# Patient Record
Sex: Female | Born: 1975
Health system: Southern US, Community
[De-identification: ages and names within clinical notes are randomized; demographics above are authoritative.]

## PROBLEM LIST (undated history)

## (undated) DIAGNOSIS — J45909 Unspecified asthma, uncomplicated: Secondary | ICD-10-CM

## (undated) DIAGNOSIS — I639 Cerebral infarction, unspecified: Secondary | ICD-10-CM

## (undated) DIAGNOSIS — Z9851 Tubal ligation status: Secondary | ICD-10-CM

## (undated) DIAGNOSIS — K219 Gastro-esophageal reflux disease without esophagitis: Secondary | ICD-10-CM

## (undated) DIAGNOSIS — I2510 Atherosclerotic heart disease of native coronary artery without angina pectoris: Secondary | ICD-10-CM

## (undated) DIAGNOSIS — E785 Hyperlipidemia, unspecified: Secondary | ICD-10-CM

## (undated) DIAGNOSIS — E1140 Type 2 diabetes mellitus with diabetic neuropathy, unspecified: Secondary | ICD-10-CM

## (undated) DIAGNOSIS — K8020 Calculus of gallbladder without cholecystitis without obstruction: Secondary | ICD-10-CM

## (undated) DIAGNOSIS — F419 Anxiety disorder, unspecified: Secondary | ICD-10-CM

## (undated) DIAGNOSIS — R0600 Dyspnea, unspecified: Secondary | ICD-10-CM

## (undated) DIAGNOSIS — E119 Type 2 diabetes mellitus without complications: Secondary | ICD-10-CM

## (undated) DIAGNOSIS — E669 Obesity, unspecified: Secondary | ICD-10-CM

## (undated) HISTORY — DX: Atherosclerotic heart disease of native coronary artery without angina pectoris: I25.10

## (undated) HISTORY — PX: TUBAL LIGATION: SHX77

## (undated) HISTORY — DX: Type 2 diabetes mellitus without complications: E11.9

## (undated) HISTORY — DX: Cerebral infarction, unspecified: I63.9

---

## 2001-06-18 ENCOUNTER — Encounter: Payer: Self-pay | Admitting: Emergency Medicine

## 2001-06-18 ENCOUNTER — Emergency Department (HOSPITAL_COMMUNITY): Admission: EM | Admit: 2001-06-18 | Discharge: 2001-06-18 | Payer: Self-pay | Admitting: Emergency Medicine

## 2002-05-10 ENCOUNTER — Encounter: Payer: Self-pay | Admitting: *Deleted

## 2002-05-10 ENCOUNTER — Ambulatory Visit (HOSPITAL_COMMUNITY): Admission: RE | Admit: 2002-05-10 | Discharge: 2002-05-10 | Payer: Self-pay | Admitting: *Deleted

## 2003-05-21 ENCOUNTER — Ambulatory Visit (HOSPITAL_COMMUNITY): Admission: RE | Admit: 2003-05-21 | Discharge: 2003-05-21 | Payer: Self-pay | Admitting: Chiropractic Medicine

## 2008-08-22 ENCOUNTER — Emergency Department (HOSPITAL_COMMUNITY): Admission: EM | Admit: 2008-08-22 | Discharge: 2008-08-22 | Payer: Self-pay | Admitting: Emergency Medicine

## 2009-08-04 DIAGNOSIS — E785 Hyperlipidemia, unspecified: Secondary | ICD-10-CM

## 2009-08-04 DIAGNOSIS — K8020 Calculus of gallbladder without cholecystitis without obstruction: Secondary | ICD-10-CM

## 2009-08-04 HISTORY — DX: Calculus of gallbladder without cholecystitis without obstruction: K80.20

## 2009-08-04 HISTORY — DX: Hyperlipidemia, unspecified: E78.5

## 2009-08-13 ENCOUNTER — Ambulatory Visit: Payer: Self-pay | Admitting: Internal Medicine

## 2009-08-17 ENCOUNTER — Ambulatory Visit: Payer: Self-pay | Admitting: Internal Medicine

## 2009-08-24 ENCOUNTER — Ambulatory Visit: Payer: Self-pay | Admitting: Internal Medicine

## 2009-08-24 ENCOUNTER — Encounter (INDEPENDENT_AMBULATORY_CARE_PROVIDER_SITE_OTHER): Payer: Self-pay | Admitting: Family Medicine

## 2009-08-24 LAB — CONVERTED CEMR LAB
ALT: 54 units/L — ABNORMAL HIGH (ref 0–35)
Albumin: 4.1 g/dL (ref 3.5–5.2)
CO2: 24 meq/L (ref 19–32)
Calcium: 9.2 mg/dL (ref 8.4–10.5)
Chloride: 103 meq/L (ref 96–112)
Cholesterol: 202 mg/dL — ABNORMAL HIGH (ref 0–200)
Eosinophils Absolute: 0.1 10*3/uL (ref 0.0–0.7)
Lymphs Abs: 3.5 10*3/uL (ref 0.7–4.0)
MCV: 78 fL (ref 78.0–100.0)
Monocytes Relative: 7 % (ref 3–12)
Neutro Abs: 2.6 10*3/uL (ref 1.7–7.7)
Neutrophils Relative %: 40 % — ABNORMAL LOW (ref 43–77)
RBC: 4.99 M/uL (ref 3.87–5.11)
Sodium: 138 meq/L (ref 135–145)
Total Protein: 7.8 g/dL (ref 6.0–8.3)
WBC: 6.7 10*3/uL (ref 4.0–10.5)

## 2009-08-26 ENCOUNTER — Encounter (INDEPENDENT_AMBULATORY_CARE_PROVIDER_SITE_OTHER): Payer: Self-pay | Admitting: Family Medicine

## 2009-08-26 LAB — CONVERTED CEMR LAB
Hep A Total Ab: POSITIVE — AB
Hep B S Ab: NEGATIVE

## 2009-09-03 ENCOUNTER — Ambulatory Visit: Payer: Self-pay | Admitting: Internal Medicine

## 2009-09-03 ENCOUNTER — Encounter (INDEPENDENT_AMBULATORY_CARE_PROVIDER_SITE_OTHER): Payer: Self-pay | Admitting: Family Medicine

## 2009-09-03 LAB — CONVERTED CEMR LAB
ALT: 38 units/L — ABNORMAL HIGH (ref 0–35)
Total Protein: 7.5 g/dL (ref 6.0–8.3)

## 2009-09-17 ENCOUNTER — Ambulatory Visit: Payer: Self-pay | Admitting: Internal Medicine

## 2010-04-15 ENCOUNTER — Emergency Department (HOSPITAL_COMMUNITY)
Admission: EM | Admit: 2010-04-15 | Discharge: 2010-04-15 | Payer: Self-pay | Source: Home / Self Care | Admitting: Family Medicine
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
## 2010-05-14 ENCOUNTER — Encounter (INDEPENDENT_AMBULATORY_CARE_PROVIDER_SITE_OTHER): Payer: Self-pay | Admitting: *Deleted

## 2010-05-14 LAB — CONVERTED CEMR LAB
AST: 47 units/L — ABNORMAL HIGH (ref 0–37)
Albumin: 4 g/dL (ref 3.5–5.2)
Alkaline Phosphatase: 53 units/L (ref 39–117)
BUN: 8 mg/dL (ref 6–23)
HDL: 38 mg/dL — ABNORMAL LOW (ref >39)
LDL Cholesterol: 130 mg/dL — ABNORMAL HIGH (ref 0–99)
Potassium: 4.3 meq/L (ref 3.5–5.3)
Sodium: 136 meq/L (ref 135–145)
Total Bilirubin: 0.4 mg/dL (ref 0.3–1.2)
VLDL: 36 mg/dL (ref 0–40)

## 2010-06-01 ENCOUNTER — Ambulatory Visit (HOSPITAL_COMMUNITY): Admission: RE | Admit: 2010-06-01 | Discharge: 2010-06-01 | Payer: Self-pay | Admitting: Family Medicine

## 2010-09-16 LAB — POCT I-STAT, CHEM 8
Calcium, Ion: 1.23 mmol/L (ref 1.12–1.32)
Chloride: 100 mEq/L (ref 96–112)
Glucose, Bld: 438 mg/dL — ABNORMAL HIGH (ref 70–99)
HCT: 41 % (ref 36.0–46.0)
Hemoglobin: 13.9 g/dL (ref 12.0–15.0)
TCO2: 26 mmol/L (ref 0–100)

## 2010-09-16 LAB — POCT URINALYSIS DIPSTICK
Glucose, UA: >=1000 mg/dL — AB
Nitrite: NEGATIVE
Specific Gravity, Urine: <=1.005 (ref 1.005–1.030)
Urobilinogen, UA: 0.2 mg/dL (ref 0.0–1.0)

## 2010-09-16 LAB — URINE CULTURE

## 2010-09-16 LAB — GLUCOSE, CAPILLARY: Glucose-Capillary: 469 mg/dL — ABNORMAL HIGH (ref 70–99)

## 2010-09-16 LAB — POCT PREGNANCY, URINE: Preg Test, Ur: NEGATIVE

## 2010-09-20 ENCOUNTER — Other Ambulatory Visit (HOSPITAL_COMMUNITY)
Admission: RE | Admit: 2010-09-20 | Discharge: 2010-09-20 | Disposition: A | Discharge status: Home / Self Care | Payer: Self-pay | Source: Ambulatory Visit | Attending: Family Medicine | Admitting: Family Medicine

## 2010-09-20 DIAGNOSIS — Z01419 Encounter for gynecological examination (general) (routine) without abnormal findings: Secondary | ICD-10-CM | POA: Insufficient documentation

## 2010-09-21 ENCOUNTER — Encounter: Payer: Self-pay | Admitting: Internal Medicine

## 2010-09-21 ENCOUNTER — Other Ambulatory Visit: Payer: Self-pay | Admitting: Family Medicine

## 2010-09-21 LAB — CONVERTED CEMR LAB
Chlamydia, DNA Probe: NEGATIVE
GC Probe Amp, Genital: NEGATIVE
HCV Ab: NEGATIVE
Hep A Total Ab: POSITIVE — AB
Hep B Core Total Ab: NEGATIVE
Hep B S Ab: NEGATIVE

## 2010-10-19 LAB — POCT I-STAT, CHEM 8
Chloride: 99 mEq/L (ref 96–112)
Creatinine, Ser: 0.8 mg/dL (ref 0.4–1.2)
Glucose, Bld: 510 mg/dL (ref 70–99)
Hemoglobin: 15.3 g/dL — ABNORMAL HIGH (ref 12.0–15.0)
Potassium: 4.3 mEq/L (ref 3.5–5.1)
Sodium: 134 mEq/L — ABNORMAL LOW (ref 135–145)

## 2011-01-14 ENCOUNTER — Emergency Department (HOSPITAL_COMMUNITY)
Admission: EM | Admit: 2011-01-14 | Discharge: 2011-01-15 | Disposition: A | Discharge status: Home / Self Care | Payer: No Typology Code available for payment source | Attending: Emergency Medicine | Admitting: Emergency Medicine

## 2011-01-14 DIAGNOSIS — M25476 Effusion, unspecified foot: Secondary | ICD-10-CM | POA: Insufficient documentation

## 2011-01-14 DIAGNOSIS — M542 Cervicalgia: Secondary | ICD-10-CM | POA: Insufficient documentation

## 2011-01-14 DIAGNOSIS — M545 Low back pain, unspecified: Secondary | ICD-10-CM | POA: Insufficient documentation

## 2011-01-14 DIAGNOSIS — S20219A Contusion of unspecified front wall of thorax, initial encounter: Secondary | ICD-10-CM | POA: Insufficient documentation

## 2011-01-14 DIAGNOSIS — S62319A Displaced fracture of base of unspecified metacarpal bone, initial encounter for closed fracture: Secondary | ICD-10-CM | POA: Insufficient documentation

## 2011-01-14 DIAGNOSIS — M25539 Pain in unspecified wrist: Secondary | ICD-10-CM | POA: Insufficient documentation

## 2011-01-14 DIAGNOSIS — E119 Type 2 diabetes mellitus without complications: Secondary | ICD-10-CM | POA: Insufficient documentation

## 2011-01-14 DIAGNOSIS — S7010XA Contusion of unspecified thigh, initial encounter: Secondary | ICD-10-CM | POA: Insufficient documentation

## 2011-01-14 DIAGNOSIS — R071 Chest pain on breathing: Secondary | ICD-10-CM | POA: Insufficient documentation

## 2011-01-14 DIAGNOSIS — Z794 Long term (current) use of insulin: Secondary | ICD-10-CM | POA: Insufficient documentation

## 2011-01-14 DIAGNOSIS — M7989 Other specified soft tissue disorders: Secondary | ICD-10-CM | POA: Insufficient documentation

## 2011-01-14 DIAGNOSIS — M25579 Pain in unspecified ankle and joints of unspecified foot: Secondary | ICD-10-CM | POA: Insufficient documentation

## 2011-01-14 DIAGNOSIS — M79609 Pain in unspecified limb: Secondary | ICD-10-CM | POA: Insufficient documentation

## 2011-01-14 DIAGNOSIS — M25473 Effusion, unspecified ankle: Secondary | ICD-10-CM | POA: Insufficient documentation

## 2011-01-14 DIAGNOSIS — S60229A Contusion of unspecified hand, initial encounter: Secondary | ICD-10-CM | POA: Insufficient documentation

## 2011-01-15 ENCOUNTER — Emergency Department (HOSPITAL_COMMUNITY): Payer: No Typology Code available for payment source

## 2011-01-15 LAB — GLUCOSE, CAPILLARY: Glucose-Capillary: 109 mg/dL — ABNORMAL HIGH (ref 70–99)
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
## 2013-02-11 ENCOUNTER — Ambulatory Visit: Payer: Self-pay | Attending: Family Medicine

## 2013-02-13 ENCOUNTER — Encounter: Payer: Self-pay | Admitting: Internal Medicine

## 2013-02-13 ENCOUNTER — Other Ambulatory Visit (HOSPITAL_COMMUNITY)
Admission: RE | Admit: 2013-02-13 | Discharge: 2013-02-13 | Disposition: A | Discharge status: Home / Self Care | Payer: No Typology Code available for payment source | Source: Ambulatory Visit | Attending: Family Medicine | Admitting: Family Medicine

## 2013-02-13 ENCOUNTER — Ambulatory Visit: Payer: No Typology Code available for payment source | Attending: Family Medicine | Admitting: Internal Medicine

## 2013-02-13 VITALS — BP 120/81 | HR 83 | Temp 98.7°F | Resp 16 | Ht 65.0 in | Wt 262.4 lb

## 2013-02-13 DIAGNOSIS — Z0000 Encounter for general adult medical examination without abnormal findings: Secondary | ICD-10-CM

## 2013-02-13 DIAGNOSIS — Z01419 Encounter for gynecological examination (general) (routine) without abnormal findings: Secondary | ICD-10-CM | POA: Insufficient documentation

## 2013-02-13 DIAGNOSIS — E119 Type 2 diabetes mellitus without complications: Secondary | ICD-10-CM | POA: Insufficient documentation

## 2013-02-13 DIAGNOSIS — Z1151 Encounter for screening for human papillomavirus (HPV): Secondary | ICD-10-CM | POA: Insufficient documentation

## 2013-02-13 DIAGNOSIS — Z9189 Other specified personal risk factors, not elsewhere classified: Secondary | ICD-10-CM

## 2013-02-13 DIAGNOSIS — Z2089 Contact with and (suspected) exposure to other communicable diseases: Secondary | ICD-10-CM

## 2013-02-13 DIAGNOSIS — Z202 Contact with and (suspected) exposure to infections with a predominantly sexual mode of transmission: Secondary | ICD-10-CM

## 2013-02-13 LAB — POCT GLYCOSYLATED HEMOGLOBIN (HGB A1C): Hemoglobin A1C: 9.3

## 2013-02-13 LAB — GLUCOSE, POCT (MANUAL RESULT ENTRY): POC Glucose: 177 mg/dl — AB (ref 70–99)

## 2013-02-13 LAB — POCT UA - GLUCOSE/PROTEIN: Protein, UA: 30

## 2013-02-13 MED ORDER — NICOTINE 14 MG/24HR TD PT24: 1.0000 | MEDICATED_PATCH | TRANSDERMAL | Status: DC

## 2013-02-13 NOTE — Progress Notes (Signed)
Patient ID: Julie Robertson, female   DOB: 10-31-1975, 37 y.o.   MRN: [id number]  CC: To establish care and for STD check  HPI: Patient is a 37 years old woman who came in today to establish care. She also wants a general physical exam done, including breast exam and Pap smear. She has no specific complaint. She wants to be checked for sexually transmitted disease regurgitation may have been exposed. Denies any vaginal discharge or itching. No abdominal pain.  She has history of diabetes on metformin.  She smoke cigarette about 1.5 packs per day, would like to quit No chest pain, no tingling sensation, no visual impairment.  No Known Allergies Past Medical History  Diagnosis Date  . Diabetes mellitus without complication    No current outpatient prescriptions on file prior to visit.   No current facility-administered medications on file prior to visit.   Family History  Problem Relation Age of Onset  . Diabetes Mother   . Heart disease Mother   . Cancer Mother   . Cancer Father   . Diabetes Father   . Heart disease Father    History   Social History  . Marital Status: Legally Separated    Spouse Name: N/A    Number of Children: N/A  . Years of Education: N/A   Occupational History  . Not on file.   Social History Main Topics  . Smoking status: Current Every Day Smoker -- 1.50 packs/day for 4 years    Types: Cigarettes  . Smokeless tobacco: Not on file  . Alcohol Use: Yes     Comment: socially  . Drug Use: Yes  . Sexual Activity: Not on file   Other Topics Concern  . Not on file   Social History Narrative  . No narrative on file    Review of Systems: Constitutional: Negative for fever, chills, diaphoresis, activity change, appetite change and fatigue. HENT: Negative for ear pain, nosebleeds, congestion, facial swelling, rhinorrhea, neck pain, neck stiffness and ear discharge.  Eyes: Negative for pain, discharge, redness, itching and visual  disturbance. Respiratory: Negative for cough, choking, chest tightness, shortness of breath, wheezing and stridor.  Cardiovascular: Negative for chest pain, palpitations and leg swelling. Gastrointestinal: Negative for abdominal distention. Genitourinary: Negative for dysuria, urgency, frequency, hematuria, flank pain, decreased urine volume, difficulty urinating and dyspareunia.  Musculoskeletal: Negative for back pain, joint swelling, arthralgias and gait problem. Neurological: Negative for dizziness, tremors, seizures, syncope, facial asymmetry, speech difficulty, weakness, light-headedness, numbness and headaches.  Hematological: Negative for adenopathy. Does not bruise/bleed easily. Psychiatric/Behavioral: Negative for hallucinations, behavioral problems, confusion, dysphoric mood, decreased concentration and agitation.    Objective:   Filed Vitals:   02/13/13 1625  BP: 120/81  Pulse: 83  Temp: 98.7 F (37.1 C)  Resp: 16    Physical Exam: Constitutional: Patient appears well-developed and well-nourished. No distress. Morbidly obese HENT: Normocephalic, atraumatic, External right and left ear normal. Oropharynx is clear and moist.  Eyes: Conjunctivae and EOM are normal. PERRLA, no scleral icterus. Neck: Normal ROM. Neck supple. No JVD. No tracheal deviation. No thyromegaly. CVS: RRR, S1/S2 +, no murmurs, no gallops, no carotid bruit.  Pulmonary: Effort and breath sounds normal, no stridor, rhonchi, wheezes, rales.  Abdominal: Soft. BS +,  no distension, tenderness, rebound or guarding.  Musculoskeletal: Normal range of motion. No edema and no tenderness.  Lymphadenopathy: No lymphadenopathy noted, cervical, inguinal or axillary Neuro: Alert. Normal reflexes, muscle tone coordination. No cranial nerve deficit. Skin: Skin is  warm and dry. No rash noted. Not diaphoretic. No erythema. No pallor. Psychiatric: Normal mood and affect. Behavior, judgment, thought content normal. Pelvic  Exam: Normal female external genitalia, Negative Cervical motion tenderness, no discharge, no unusual odor  Lab Results  Component Value Date   WBC 6.7 08/24/2009   HGB 13.9 04/15/2010   HCT 41.0 04/15/2010   MCV 78.0 08/24/2009   PLT 294 08/24/2009   Lab Results  Component Value Date   CREATININE 0.84 05/14/2010   BUN 8 05/14/2010   NA 136 05/14/2010   K 4.3 05/14/2010   CL 103 05/14/2010   CO2 23 05/14/2010    Lab Results  Component Value Date   HGBA1C  Value: 11.6 (NOTE)   The ADA recommends the following therapeutic goal for glycemic   control related to Hgb A1C measurement:   Goal of Therapy:   < 7.0% Hgb A1C   Reference: American Diabetes Association: Clinical Practice   Recommendations 2008, Diabetes Care,  2008, 31:(Suppl 1).* 08/22/2008   Lipid Panel     Component Value Date/Time   CHOL 204* 05/14/2010 2135   TRIG 178* 05/14/2010 2135   HDL 38* 05/14/2010 2135   CHOLHDL 5.4 Ratio 05/14/2010 2135   VLDL 36 05/14/2010 2135   LDLCALC 130* 05/14/2010 2135       Assessment and plan:   Patient Active Problem List   Diagnosis Date Noted  . Pap smear, high-risk (screening, no prior abnormality) 02/13/2013  . Diabetes 02/13/2013  . Exposure to STD 02/13/2013  . Physical exam 02/13/2013   Comprehensive metabolic panel Hemoglobin A1c Pap smear GC/chlamydia Thyroid function test Lipid panel Urinalysis with reflex microscopic and culture  Medication refill Nicotine patch Extensive counseling on smoking cessation  Will review results and will base subsequent management on the findings  Julie Robertson was given clear instructions to go to ER or return to the clinic if symptoms don't improve, worsen or new problems develop.  Julie Robertson verbalized understanding.  Julie Robertson was told to call to get lab results if hasn't heard anything in the next week.        Jeanann Lewandowsky, MD [facility] [place], [place] [phone]   02/13/2013, 4:58 PM

## 2013-02-13 NOTE — Progress Notes (Signed)
Pt here to establish care for DIABETES MANAGEMENT. PT IS TAKING METFORMIN 1000MG  BID, BUT NOT TAKING INSULIN IN 3 MNTHS. ALSO STATES HER GLUCOMETER IS BROKEN.  NEED CBG/A1C DONE C/O BLURRY VISION,FREQ URINATION NEED ANNUAL PHYSICAL WITH PAP SMEAR

## 2013-02-14 ENCOUNTER — Encounter: Payer: Self-pay | Admitting: Internal Medicine

## 2013-02-14 LAB — LIPID PANEL
Cholesterol: 217 mg/dL — ABNORMAL HIGH (ref 0–200)
HDL: 36 mg/dL — ABNORMAL LOW (ref >39)
LDL Cholesterol: 138 mg/dL — ABNORMAL HIGH (ref 0–99)
Total CHOL/HDL Ratio: 6 Ratio
Triglycerides: 217 mg/dL — ABNORMAL HIGH (ref <150)

## 2013-02-14 LAB — GC/CHLAMYDIA PROBE AMP: GC Probe RNA: NEGATIVE

## 2013-02-14 LAB — CBC WITH DIFFERENTIAL/PLATELET
Eosinophils Absolute: 0.2 10*3/uL (ref 0.0–0.7)
Eosinophils Relative: 3 % (ref 0–5)
HCT: 39.1 % (ref 36.0–46.0)
Lymphocytes Relative: 40 % (ref 12–46)
Lymphs Abs: 3.1 10*3/uL (ref 0.7–4.0)
MCH: 24.2 pg — ABNORMAL LOW (ref 26.0–34.0)
MCV: 77.4 fL — ABNORMAL LOW (ref 78.0–100.0)
Monocytes Absolute: 0.4 10*3/uL (ref 0.1–1.0)
Monocytes Relative: 5 % (ref 3–12)
RBC: 5.05 MIL/uL (ref 3.87–5.11)
WBC: 7.6 10*3/uL (ref 4.0–10.5)

## 2013-02-14 LAB — COMPLETE METABOLIC PANEL WITH GFR
ALT: 50 U/L — ABNORMAL HIGH (ref 0–35)
AST: 41 U/L — ABNORMAL HIGH (ref 0–37)
Alkaline Phosphatase: 72 U/L (ref 39–117)
BUN: 13 mg/dL (ref 6–23)
Calcium: 9 mg/dL (ref 8.4–10.5)
Chloride: 100 mEq/L (ref 96–112)
Creat: 0.79 mg/dL (ref 0.50–1.10)
Potassium: 3.8 mEq/L (ref 3.5–5.3)

## 2013-02-14 LAB — TSH: TSH: 1.612 u[IU]/mL (ref 0.350–4.500)

## 2013-02-21 ENCOUNTER — Telehealth: Payer: Self-pay | Admitting: Emergency Medicine

## 2013-02-21 NOTE — Telephone Encounter (Signed)
PT NOTIFIED OF NORMAL PAP SMEAR- JILL SMITH RN

## 2013-03-18 ENCOUNTER — Ambulatory Visit: Payer: No Typology Code available for payment source | Admitting: Family Medicine

## 2013-07-02 ENCOUNTER — Telehealth: Payer: Self-pay | Admitting: Internal Medicine

## 2013-07-02 NOTE — Telephone Encounter (Signed)
Pt called regarding a refill of her Diabetes medication,  Pt has appointment tomorrow @ 2:30pm, pt states that she is feeling a little sick without her medication. Please contact pt

## 2013-07-03 ENCOUNTER — Ambulatory Visit: Payer: No Typology Code available for payment source | Attending: Internal Medicine | Admitting: Internal Medicine

## 2013-07-03 ENCOUNTER — Encounter: Payer: Self-pay | Admitting: Internal Medicine

## 2013-07-03 VITALS — BP 135/88 | HR 83 | Temp 98.1°F | Resp 17 | Wt 259.6 lb

## 2013-07-03 DIAGNOSIS — K5900 Constipation, unspecified: Secondary | ICD-10-CM | POA: Insufficient documentation

## 2013-07-03 DIAGNOSIS — F17200 Nicotine dependence, unspecified, uncomplicated: Secondary | ICD-10-CM

## 2013-07-03 DIAGNOSIS — E785 Hyperlipidemia, unspecified: Secondary | ICD-10-CM

## 2013-07-03 DIAGNOSIS — E119 Type 2 diabetes mellitus without complications: Secondary | ICD-10-CM | POA: Insufficient documentation

## 2013-07-03 LAB — GLUCOSE, POCT (MANUAL RESULT ENTRY): POC Glucose: 387 mg/dl — AB (ref 70–99)

## 2013-07-03 MED ORDER — MAGNESIUM HYDROXIDE 400 MG/5ML PO SUSP: 5.0000 mL | Freq: Every day | ORAL | Status: DC | PRN

## 2013-07-03 MED ORDER — FREESTYLE SYSTEM KIT: 1.0000 | PACK | Freq: Three times a day (TID) | Status: DC

## 2013-07-03 MED ORDER — NICOTINE 14 MG/24HR TD PT24: 14.0000 mg | MEDICATED_PATCH | TRANSDERMAL | Status: DC

## 2013-07-03 MED ORDER — METFORMIN HCL 1000 MG PO TABS: 1000.0000 mg | ORAL_TABLET | Freq: Two times a day (BID) | ORAL | Status: DC

## 2013-07-03 MED ORDER — INSULIN GLARGINE 100 UNIT/ML SOLOSTAR PEN: 15.0000 [IU] | PEN_INJECTOR | Freq: Every day | SUBCUTANEOUS | Status: DC

## 2013-07-03 MED ORDER — ATORVASTATIN CALCIUM 10 MG PO TABS: 10.0000 mg | ORAL_TABLET | Freq: Every day | ORAL | Status: DC

## 2013-07-03 MED ORDER — INSULIN PEN NEEDLE 31G X 5 MM MISC: Status: DC

## 2013-07-03 NOTE — Progress Notes (Signed)
Patient here for follow up DM todays blood sugar is 387 Needs medication refilled

## 2013-07-03 NOTE — Progress Notes (Signed)
MRN: [id number] Name: Julie Robertson  Sex: female Age: 37 y.o. DOB: Feb 23, 1976  Allergies: Review of patient's allergies indicates no known allergies.  Chief Complaint  Patient presents with  . Medication Refill    HPI: Patient is 37 y.o. female who has history of diabetes for several years, as per patient she used to be on Lantus 20 units, currently only taking metformin and also skipped her metformin, had symptoms of excessive thirst, polyuria polyphagia, denies any dysuria, denies any headache dizziness chest pain or shortness of breath, her previous blood work reviewed has elevated cholesterol, today her hemoglobin A1c is 10.3 %, she also wants to quit smoking. Patient reported to have lot of constipation issues denies any nausea vomiting.   Past Medical History  Diagnosis Date  . Diabetes mellitus without complication     Past Surgical History  Procedure Laterality Date  . Tubal ligation        Medication List       This list is accurate as of: 07/03/13  3:21 PM.  Always use your most recent med list.               atorvastatin 10 MG tablet  Commonly known as:  LIPITOR  Take 1 tablet (10 mg total) by mouth daily.     glucose monitoring kit monitoring kit  1 each by Does not apply route 4 (four) times daily - after meals and at bedtime. 1 month Diabetic Testing Supplies for QAC-QHS accuchecks.     Insulin Glargine 100 UNIT/ML Sopn  Commonly known as:  LANTUS SOLOSTAR  Inject 15 Units into the skin at bedtime.     Insulin Pen Needle 31G X 5 MM Misc  Inject 10 units subcutaneous at bedtime     magnesium hydroxide 400 MG/5ML suspension  Commonly known as:  MILK OF MAGNESIA  Take 5 mLs by mouth daily as needed for mild constipation.     metFORMIN 1000 MG tablet  Commonly known as:  GLUCOPHAGE  Take 1 tablet (1,000 mg total) by mouth 2 (two) times daily with a meal.     nicotine 14 mg/24hr patch  Commonly known as:  NICODERM CQ - dosed in mg/24 hours   Place 1 patch (14 mg total) onto the skin daily.        Meds ordered this encounter  Medications  . Insulin Glargine (LANTUS SOLOSTAR) 100 UNIT/ML SOPN    Sig: Inject 15 Units into the skin at bedtime.    Dispense:  5 pen    Refill:  PRN  . Insulin Pen Needle 31G X 5 MM MISC    Sig: Inject 10 units subcutaneous at bedtime    Dispense:  100 each    Refill:  2  . atorvastatin (LIPITOR) 10 MG tablet    Sig: Take 1 tablet (10 mg total) by mouth daily.    Dispense:  90 tablet    Refill:  3  . magnesium hydroxide (MILK OF MAGNESIA) 400 MG/5ML suspension    Sig: Take 5 mLs by mouth daily as needed for mild constipation.    Dispense:  360 mL    Refill:  1  . metFORMIN (GLUCOPHAGE) 1000 MG tablet    Sig: Take 1 tablet (1,000 mg total) by mouth 2 (two) times daily with a meal.    Dispense:  60 tablet    Refill:  3  . nicotine (NICODERM CQ - DOSED IN MG/24 HOURS) 14 mg/24hr patch    Sig: Place 1  patch (14 mg total) onto the skin daily.    Dispense:  28 patch    Refill:  0  . glucose monitoring kit (FREESTYLE) monitoring kit    Sig: 1 each by Does not apply route 4 (four) times daily - after meals and at bedtime. 1 month Diabetic Testing Supplies for QAC-QHS accuchecks.    Dispense:  1 each    Refill:  1     There is no immunization history on file for this patient.  Family History  Problem Relation Age of Onset  . Diabetes Mother   . Heart disease Mother   . Cancer Mother   . Cancer Father   . Diabetes Father   . Heart disease Father     History  Substance Use Topics  . Smoking status: Current Every Day Smoker -- 1.50 packs/day for 4 years    Types: Cigarettes  . Smokeless tobacco: Not on file  . Alcohol Use: Yes     Comment: socially    Review of Systems  As noted in HPI  Filed Vitals:   07/03/13 1501  BP: 135/88  Pulse: 83  Temp: 98.1 F (36.7 C)  Resp: 17    Physical Exam  Physical Exam  Constitutional: No distress.  Eyes: EOM are normal. Pupils  are equal, round, and reactive to light.  Cardiovascular: Normal rate and regular rhythm.   No murmur heard. Pulmonary/Chest: Breath sounds normal. No respiratory distress. She has no wheezes. She has no rales.  Abdominal: Soft. There is no tenderness.    CBC    Component Value Date/Time   WBC 7.6 02/13/2013 1701   RBC 5.05 02/13/2013 1701   HGB 12.2 02/13/2013 1701   HCT 39.1 02/13/2013 1701   PLT 351 02/13/2013 1701   MCV 77.4* 02/13/2013 1701   LYMPHSABS 3.1 02/13/2013 1701   MONOABS 0.4 02/13/2013 1701   EOSABS 0.2 02/13/2013 1701   BASOSABS 0.0 02/13/2013 1701    CMP     Component Value Date/Time   NA 134* 02/13/2013 1701   K 3.8 02/13/2013 1701   CL 100 02/13/2013 1701   CO2 25 02/13/2013 1701   GLUCOSE 170* 02/13/2013 1701   BUN 13 02/13/2013 1701   CREATININE 0.79 02/13/2013 1701   CREATININE 0.84 05/14/2010 2135   CALCIUM 9.0 02/13/2013 1701   PROT 7.1 02/13/2013 1701   ALBUMIN 3.9 02/13/2013 1701   AST 41* 02/13/2013 1701   ALT 50* 02/13/2013 1701   ALKPHOS 72 02/13/2013 1701   BILITOT 0.5 02/13/2013 1701    Lab Results  Component Value Date/Time   CHOL 217* 02/13/2013  5:01 PM    No components found with this basename: hga1c    Lab Results  Component Value Date/Time   AST 41* 02/13/2013  5:01 PM    Assessment and Plan  DM (diabetes mellitus) - Plan: Glucose (CBG), HgB A1c 10.3%,  I have started her 15 units each bedtime Insulin Glargine (LANTUS SOLOSTAR) 100 UNIT/ML SOPN, Insulin Pen Needle 31G X 5 MM MISC,  continue with metFORMIN (GLUCOPHAGE) 1000 MG tablet, glucose monitoring kit (FREESTYLE) monitoring kit  Hyperlipidemia - Plan:  started patient on atorvastatin (LIPITOR) 10 MG tablet daily   Unspecified constipation - Plan: magnesium hydroxide (MILK OF MAGNESIA) 400 MG/5ML suspension  Smoking - Plan: nicotine (NICODERM CQ - DOSED IN MG/24 HOURS) 14 mg/24hr patch   Return in about 2 months (around 08/31/2013).  Doris Cheadle, MD

## 2013-07-24 ENCOUNTER — Ambulatory Visit: Payer: No Typology Code available for payment source | Attending: Internal Medicine

## 2013-09-02 ENCOUNTER — Ambulatory Visit: Payer: No Typology Code available for payment source | Attending: Internal Medicine | Admitting: Internal Medicine

## 2013-09-02 ENCOUNTER — Encounter: Payer: Self-pay | Admitting: Internal Medicine

## 2013-09-02 VITALS — BP 131/90 | HR 98 | Temp 98.2°F | Resp 16

## 2013-09-02 DIAGNOSIS — E119 Type 2 diabetes mellitus without complications: Secondary | ICD-10-CM

## 2013-09-02 DIAGNOSIS — F17200 Nicotine dependence, unspecified, uncomplicated: Secondary | ICD-10-CM | POA: Insufficient documentation

## 2013-09-02 DIAGNOSIS — Z09 Encounter for follow-up examination after completed treatment for conditions other than malignant neoplasm: Secondary | ICD-10-CM | POA: Insufficient documentation

## 2013-09-02 DIAGNOSIS — E785 Hyperlipidemia, unspecified: Secondary | ICD-10-CM | POA: Insufficient documentation

## 2013-09-02 LAB — GLUCOSE, POCT (MANUAL RESULT ENTRY): POC Glucose: 163 mg/dl — AB (ref 70–99)

## 2013-09-02 LAB — POCT GLYCOSYLATED HEMOGLOBIN (HGB A1C): Hemoglobin A1C: 10.1

## 2013-09-02 MED ORDER — INSULIN ASPART PROT & ASPART (70-30 MIX) 100 UNIT/ML ~~LOC~~ SUSP: 12.0000 [IU] | Freq: Two times a day (BID) | SUBCUTANEOUS | Status: DC

## 2013-09-02 MED ORDER — ATORVASTATIN CALCIUM 10 MG PO TABS: 10.0000 mg | ORAL_TABLET | Freq: Every day | ORAL | Status: DC

## 2013-09-02 NOTE — Progress Notes (Signed)
MRN: [id number] Name: Julie Robertson  Sex: female Age: 38 y.o. DOB: 01/12/1976  Allergies: Review of patient's allergies indicates no known allergies.  Chief Complaint  Patient presents with  . Follow-up    HPI: Patient is 38 y.o. female who comes today for followup history of diabetes hyperlipidemia comes today for follow up, on the last visit she was prescribed Lantus, as per patient she has not taken it since she could not afford, she denies any hypoglycemic symptoms has been taking metformin 1 g twice a day, on the last visit she was also prescribed Lipitor as per patient she is not taking that medication, she still smokes cigarettes, advised to quit smoking.  Past Medical History  Diagnosis Date  . Diabetes mellitus without complication     Past Surgical History  Procedure Laterality Date  . Tubal ligation        Medication List       This list is accurate as of: 09/02/13  3:41 PM.  Always use your most recent med list.               atorvastatin 10 MG tablet  Commonly known as:  LIPITOR  Take 1 tablet (10 mg total) by mouth daily.     glucose monitoring kit monitoring kit  1 each by Does not apply route 4 (four) times daily - after meals and at bedtime. 1 month Diabetic Testing Supplies for QAC-QHS accuchecks.     insulin aspart protamine- aspart (70-30) 100 UNIT/ML injection  Commonly known as:  NOVOLOG MIX 70/30  Inject 0.12 mLs (12 Units total) into the skin 2 (two) times daily with a meal.     Insulin Glargine 100 UNIT/ML Solostar Pen  Commonly known as:  LANTUS SOLOSTAR  Inject 15 Units into the skin at bedtime.     Insulin Pen Needle 31G X 5 MM Misc  Inject 10 units subcutaneous at bedtime     magnesium hydroxide 400 MG/5ML suspension  Commonly known as:  MILK OF MAGNESIA  Take 5 mLs by mouth daily as needed for mild constipation.     metFORMIN 1000 MG tablet  Commonly known as:  GLUCOPHAGE  Take 1 tablet (1,000 mg total) by mouth 2  (two) times daily with a meal.     nicotine 14 mg/24hr patch  Commonly known as:  NICODERM CQ - dosed in mg/24 hours  Place 1 patch (14 mg total) onto the skin daily.        Meds ordered this encounter  Medications  . insulin aspart protamine- aspart (NOVOLOG MIX 70/30) (70-30) 100 UNIT/ML injection    Sig: Inject 0.12 mLs (12 Units total) into the skin 2 (two) times daily with a meal.    Dispense:  10 mL    Refill:  2  . atorvastatin (LIPITOR) 10 MG tablet    Sig: Take 1 tablet (10 mg total) by mouth daily.    Dispense:  90 tablet    Refill:  3     There is no immunization history on file for this patient.  Family History  Problem Relation Age of Onset  . Diabetes Mother   . Heart disease Mother   . Cancer Mother   . Cancer Father   . Diabetes Father   . Heart disease Father     History  Substance Use Topics  . Smoking status: Current Every Day Smoker -- 1.50 packs/day for 4 years    Types: Cigarettes  . Smokeless  tobacco: Not on file  . Alcohol Use: Yes     Comment: socially    Review of Systems   As noted in HPI  Filed Vitals:   09/02/13 1435  BP: 131/90  Pulse: 98  Temp: 98.2 F (36.8 C)  Resp: 16    Physical Exam  Physical Exam  Constitutional: No distress.  Eyes: EOM are normal. Pupils are equal, round, and reactive to light.  Cardiovascular: Normal rate and regular rhythm.   Pulmonary/Chest: Breath sounds normal. No respiratory distress. She has no wheezes. She has no rales.  Musculoskeletal: She exhibits no edema.    CBC    Component Value Date/Time   WBC 7.6 02/13/2013 1701   RBC 5.05 02/13/2013 1701   HGB 12.2 02/13/2013 1701   HCT 39.1 02/13/2013 1701   PLT 351 02/13/2013 1701   MCV 77.4* 02/13/2013 1701   LYMPHSABS 3.1 02/13/2013 1701   MONOABS 0.4 02/13/2013 1701   EOSABS 0.2 02/13/2013 1701   BASOSABS 0.0 02/13/2013 1701    CMP     Component Value Date/Time   NA 134* 02/13/2013 1701   K 3.8 02/13/2013 1701   CL 100 02/13/2013  1701   CO2 25 02/13/2013 1701   GLUCOSE 170* 02/13/2013 1701   BUN 13 02/13/2013 1701   CREATININE 0.79 02/13/2013 1701   CREATININE 0.84 05/14/2010 2135   CALCIUM 9.0 02/13/2013 1701   PROT 7.1 02/13/2013 1701   ALBUMIN 3.9 02/13/2013 1701   AST 41* 02/13/2013 1701   ALT 50* 02/13/2013 1701   ALKPHOS 72 02/13/2013 1701   BILITOT 0.5 02/13/2013 1701    Lab Results  Component Value Date/Time   CHOL 217* 02/13/2013  5:01 PM    No components found with this basename: hga1c    Lab Results  Component Value Date/Time   AST 41* 02/13/2013  5:01 PM    Assessment and Plan  DM (diabetes mellitus)uncontrolled Results for orders placed in visit on 09/02/13  GLUCOSE, POCT (MANUAL RESULT ENTRY)      Result Value Ref Range   POC Glucose 163 (*) 70 - 99 mg/dl  POCT GLYCOSYLATED HEMOGLOBIN (HGB A1C)      Result Value Ref Range   Hemoglobin A1C 10.1%      - Plan: Glucose (CBG), HgB A1c Patient will continue with metformin 1 g twice a day, has patient could not afford Lantus, swish to NovoLog 70/3012 units twice a day, advise patient to monitor fingerstick glucose at home  Hyperlipidemia - Plan: atorvastatin (LIPITOR) 10 MG tablet, Fasting lipid panel on the next visit.  Smoking Advised patient to quit smoking. Patient has already been prescribed nicotine patch.   Return in about 3 months (around 12/03/2013).  Lorayne Marek, MD

## 2013-09-02 NOTE — Progress Notes (Signed)
Patient here for follow up DM 

## 2013-10-04 ENCOUNTER — Telehealth: Payer: Self-pay | Admitting: Internal Medicine

## 2013-10-04 NOTE — Telephone Encounter (Signed)
Pt called regarding a sharp pain on her arm, Pt states that her finger has gone numb fro the pain. Pt is also a diabetic and is scared that it has something to do with it. Please contact pt as soon as possible

## 2013-12-02 DIAGNOSIS — E1140 Type 2 diabetes mellitus with diabetic neuropathy, unspecified: Secondary | ICD-10-CM

## 2013-12-02 HISTORY — DX: Type 2 diabetes mellitus with diabetic neuropathy, unspecified: E11.40

## 2013-12-03 ENCOUNTER — Ambulatory Visit: Payer: No Typology Code available for payment source | Attending: Internal Medicine | Admitting: Internal Medicine

## 2013-12-03 ENCOUNTER — Encounter: Payer: Self-pay | Admitting: Internal Medicine

## 2013-12-03 VITALS — BP 148/88 | HR 72 | Temp 98.2°F | Resp 16 | Wt 248.4 lb

## 2013-12-03 DIAGNOSIS — Z794 Long term (current) use of insulin: Secondary | ICD-10-CM | POA: Insufficient documentation

## 2013-12-03 DIAGNOSIS — F17200 Nicotine dependence, unspecified, uncomplicated: Secondary | ICD-10-CM | POA: Insufficient documentation

## 2013-12-03 DIAGNOSIS — I10 Essential (primary) hypertension: Secondary | ICD-10-CM | POA: Insufficient documentation

## 2013-12-03 DIAGNOSIS — IMO0001 Reserved for inherently not codable concepts without codable children: Secondary | ICD-10-CM

## 2013-12-03 DIAGNOSIS — R209 Unspecified disturbances of skin sensation: Secondary | ICD-10-CM | POA: Insufficient documentation

## 2013-12-03 DIAGNOSIS — E119 Type 2 diabetes mellitus without complications: Secondary | ICD-10-CM | POA: Insufficient documentation

## 2013-12-03 DIAGNOSIS — E785 Hyperlipidemia, unspecified: Secondary | ICD-10-CM | POA: Insufficient documentation

## 2013-12-03 DIAGNOSIS — R202 Paresthesia of skin: Secondary | ICD-10-CM

## 2013-12-03 DIAGNOSIS — R200 Anesthesia of skin: Secondary | ICD-10-CM

## 2013-12-03 DIAGNOSIS — R030 Elevated blood-pressure reading, without diagnosis of hypertension: Secondary | ICD-10-CM

## 2013-12-03 LAB — GLUCOSE, POCT (MANUAL RESULT ENTRY): POC Glucose: 121 mg/dl — AB (ref 70–99)

## 2013-12-03 LAB — POCT GLYCOSYLATED HEMOGLOBIN (HGB A1C): Hemoglobin A1C: 6.9

## 2013-12-03 MED ORDER — METFORMIN HCL 1000 MG PO TABS: 1000.0000 mg | ORAL_TABLET | Freq: Two times a day (BID) | ORAL | Status: DC

## 2013-12-03 MED ORDER — GABAPENTIN 300 MG PO CAPS: 300.0000 mg | ORAL_CAPSULE | Freq: Every day | ORAL | Status: DC

## 2013-12-03 MED ORDER — INSULIN ASPART PROT & ASPART (70-30 MIX) 100 UNIT/ML ~~LOC~~ SUSP: 12.0000 [IU] | Freq: Two times a day (BID) | SUBCUTANEOUS | Status: DC

## 2013-12-03 MED ORDER — ATORVASTATIN CALCIUM 10 MG PO TABS: 10.0000 mg | ORAL_TABLET | Freq: Every day | ORAL | Status: DC

## 2013-12-03 NOTE — Progress Notes (Signed)
MRN: [id number] Name: Julie Robertson  Sex: female Age: 38 y.o. DOB: June 21, 1976  Allergies: Review of patient's allergies indicates no known allergies.  Chief Complaint  Patient presents with  . Follow-up    HPI: Patient is 38 y.o. female who has to of diabetes hyperlipidemia comes today for followup, she is taking NovoLog 70/3012 units twice a day, denies any hypoglycemic symptoms her hemoglobin A1c is improved, she reported to have numbness tingling in in her right and denies any fall or trauma denies any fever chills. Patient is to smoke cigarettes, I have advised patient quit smoking. Patient reported to have noticed bluish discoloration of her toe yesterday but not anymore.  Past Medical History  Diagnosis Date  . Diabetes mellitus without complication     Past Surgical History  Procedure Laterality Date  . Tubal ligation        Medication List       This list is accurate as of: 12/03/13  5:32 PM.  Always use your most recent med list.               atorvastatin 10 MG tablet  Commonly known as:  LIPITOR  Take 1 tablet (10 mg total) by mouth daily.     gabapentin 300 MG capsule  Commonly known as:  NEURONTIN  Take 1 capsule (300 mg total) by mouth at bedtime.     glucose monitoring kit monitoring kit  1 each by Does not apply route 4 (four) times daily - after meals and at bedtime. 1 month Diabetic Testing Supplies for QAC-QHS accuchecks.     insulin aspart protamine- aspart (70-30) 100 UNIT/ML injection  Commonly known as:  NOVOLOG MIX 70/30  Inject 0.12 mLs (12 Units total) into the skin 2 (two) times daily with a meal.     Insulin Pen Needle 31G X 5 MM Misc  Inject 10 units subcutaneous at bedtime     magnesium hydroxide 400 MG/5ML suspension  Commonly known as:  MILK OF MAGNESIA  Take 5 mLs by mouth daily as needed for mild constipation.     metFORMIN 1000 MG tablet  Commonly known as:  GLUCOPHAGE  Take 1 tablet (1,000 mg total) by mouth  2 (two) times daily with a meal.     nicotine 14 mg/24hr patch  Commonly known as:  NICODERM CQ - dosed in mg/24 hours  Place 1 patch (14 mg total) onto the skin daily.        Meds ordered this encounter  Medications  . gabapentin (NEURONTIN) 300 MG capsule    Sig: Take 1 capsule (300 mg total) by mouth at bedtime.    Dispense:  90 capsule    Refill:  3  . atorvastatin (LIPITOR) 10 MG tablet    Sig: Take 1 tablet (10 mg total) by mouth daily.    Dispense:  90 tablet    Refill:  3  . insulin aspart protamine- aspart (NOVOLOG MIX 70/30) (70-30) 100 UNIT/ML injection    Sig: Inject 0.12 mLs (12 Units total) into the skin 2 (two) times daily with a meal.    Dispense:  10 mL    Refill:  2  . metFORMIN (GLUCOPHAGE) 1000 MG tablet    Sig: Take 1 tablet (1,000 mg total) by mouth 2 (two) times daily with a meal.    Dispense:  60 tablet    Refill:  3     There is no immunization history on file for this patient.  Family History  Problem Relation Age of Onset  . Diabetes Mother   . Heart disease Mother   . Cancer Mother   . Cancer Father   . Diabetes Father   . Heart disease Father     History  Substance Use Topics  . Smoking status: Current Every Day Smoker -- 1.50 packs/day for 4 years    Types: Cigarettes  . Smokeless tobacco: Not on file  . Alcohol Use: Yes     Comment: socially    Review of Systems   As noted in HPI  Filed Vitals:   12/03/13 1655  BP: 148/88  Pulse: 72  Temp: 98.2 F (36.8 C)  Resp: 16    Physical Exam  Physical Exam  Constitutional: No distress.  Eyes: EOM are normal. Pupils are equal, round, and reactive to light.  Cardiovascular: Normal rate and regular rhythm.   Pulmonary/Chest: Breath sounds normal. No respiratory distress. She has no wheezes. She has no rales.  Musculoskeletal: She exhibits no edema.  Right hand Tinel's test positive ( ? CTS), handgrip OK 2+ radial pulse    CBC    Component Value Date/Time   WBC 7.6  02/13/2013 1701   RBC 5.05 02/13/2013 1701   HGB 12.2 02/13/2013 1701   HCT 39.1 02/13/2013 1701   PLT 351 02/13/2013 1701   MCV 77.4* 02/13/2013 1701   LYMPHSABS 3.1 02/13/2013 1701   MONOABS 0.4 02/13/2013 1701   EOSABS 0.2 02/13/2013 1701   BASOSABS 0.0 02/13/2013 1701    CMP     Component Value Date/Time   NA 134* 02/13/2013 1701   K 3.8 02/13/2013 1701   CL 100 02/13/2013 1701   CO2 25 02/13/2013 1701   GLUCOSE 170* 02/13/2013 1701   BUN 13 02/13/2013 1701   CREATININE 0.79 02/13/2013 1701   CREATININE 0.84 05/14/2010 2135   CALCIUM 9.0 02/13/2013 1701   PROT 7.1 02/13/2013 1701   ALBUMIN 3.9 02/13/2013 1701   AST 41* 02/13/2013 1701   ALT 50* 02/13/2013 1701   ALKPHOS 72 02/13/2013 1701   BILITOT 0.5 02/13/2013 1701   GFRNONAA >89 02/13/2013 1701   GFRAA >89 02/13/2013 1701    Lab Results  Component Value Date/Time   CHOL 217* 02/13/2013  5:01 PM    No components found with this basename: hga1c    Lab Results  Component Value Date/Time   AST 41* 02/13/2013  5:01 PM    Assessment and Plan  DM (diabetes mellitus) - Plan: Glucose  Results for orders placed in visit on 12/03/13  GLUCOSE, POCT (MANUAL RESULT ENTRY)      Result Value Ref Range   POC Glucose 121 (*) 70 - 99 mg/dl  POCT GLYCOSYLATED HEMOGLOBIN (HGB A1C)      Result Value Ref Range   Hemoglobin A1C 6.9     A1c is improved, continue with her current dose of insulin and metformin, advise for fingerstick monitoring at home, COMPLETE METABOLIC PANEL WITH GFR, insulin aspart protamine- aspart (NOVOLOG MIX 70/30) (70-30) 100 UNIT/ML injection, metFORMIN (GLUCOPHAGE) 1000 MG tablet  Hyperlipidemia - Plan: Fasting Lipid panel, continue with atorvastatin (LIPITOR) 10 MG tablet  Smoking Advised patient to quit smoking.  Numbness and tingling in right hand - Plan:  gabapentin (NEURONTIN) 300 MG capsule, Ambulatory referral to Neurology for further evaluation and possible no conduction test.  Elevated BP Have advised patient  for DASH diet.  Return in about 3 months (around 03/05/2014) for diabetes, hypertension.  Lorayne Marek, MD

## 2013-12-03 NOTE — Progress Notes (Signed)
Patient here with interpreter Here for follow up on her DM Complains of numbness to her fingers Left great toe had changed color

## 2013-12-03 NOTE — Patient Instructions (Signed)
La dieta DASH  (DASH Diet) La dieta DASH (por su nombre en ingls) significa "Abordaje diettico para detener la hipertensin". Es un plan de alimentacin saludable que ha demostrado reducir la presin arterial elevada (hipertensin) en tan solo [address] mientras probablemente tambin ofrezca otros beneficios significativos para la salud. Estos otros beneficios incluyen la reduccin del riesgo de sufrir cncer de mama despus de la menopausia y de sufrir diabetes tipo 2, enfermedades cardacas, cncer de colon e ictus. Los beneficios para la salud tambin incluyen la prdida de peso y la disminucin del riesgo de insuficiencia renal en pacientes con enfermedad renal crnica.  GUAS PARA LA DIETA   Limite la cantidad de sal (sodio). Su dieta debe contener menos de 1500 mg de [name].  Limite el consumo de carbohidratos refinados o procesados. Su dieta debe incluir principalmente granos enteros. Consuma postres con moderacin y limite el agregado de Location manager.  Incluya pequeas cantidades de grasas saludables para el corazn. Estos tipos de grasas estn contenidas en las nueces, aceites y [place] y trans. Estas grasas han demostrado ser perjudiciales para el organismo. ELECCIN DE LOS ALIMENTOS  Los siguientes grupos de alimentos se basan en una dieta de 2000 caloras. Consulte a su nutricionista matriculado cules son sus necesidades calricas individuales.  Granos y productos elaborados con granos (6 a 8 porciones por Training and development officer).   Consuma ms a menudo: Pan integral, arroz integral, pasta de trigo o de [place] integral, quinoa, palomitas de maz sin grasa o sal aadida (infladas).  Consuma con menos frecuencia:  Pan blanco, pastas blancas, arroz blanco, pan de maz. Verduras (4 a 5 porciones por Training and development officer).   Consuma ms a menudo: Hortalizas frescas, congeladas y IT sales professional. Puede consumir las hortalizas crudas, al vapor, asadas o grilladas con una mnima cantidad de  grasas.  Consuma con menos frecuencia o evite: Vegetales con crema o fritos. Verduras en [place]. Frutas (4 a 5 porciones por da).  Consuma ms a menudo: Frutas frescas, en conserva (en su jugo natural) o frutas congeladas. Frutas secas sin el agregado de azcar. Cien por ciento jugo de fruta ( taza [237 ml] por da).  Consuma con menos frecuencia: Frutas secas con azcar agregado. Fruta enlatada en almbar light o espeso. Carnes magras, pescado y aves de corral (2 porciones o Programmer, multimedia. Una porcin es de 3 a 4 oz. [id number] g]).  Consuma ms a menudo: Carne molida 90% magra o ms de lomo o solomillo . Cortes redondos de carne, [place] de pollo y de [place]. Todos los pescados. Prepare la carne al horno o asada a la parrilla o al grill. Nada debe ser frito.  Consuma con menos frecuencia o evite: Cortes grasos de carne, [place] o pata, muslo o ala de pollo. Cortes de carne o pescado fritos. Lcteos (2 a 3 porciones)  Consuma ms a menudo: La [employer] o con bajo contenido de grasa, yogur descremado o semidescremado, queso con bajo contenido en grasa o parcialmente descremado.  Consuma con menos frecuencia o evite: Leche (entera, al 2%). [place] Quesos con toda su grasa. Nueces, semillas y legumbres (4 a 5 porciones por semana).  Consuma ms a menudo: Todos los alimentos sin sal agregada.  Consuma con menos frecuencia o evite: Nueces y semillas con sal, frijoles enlatados con sal agregada. Grasas y dulces (limitados)  Consuma ms a menudo: Art gallery manager, margarinas en pote sin grasas trans, gelatina sin azcar. [place] y aderezos para Associate Professor.  Consuma  con menos frecuencia o evite: Aceites de coco, aceites de palma, mantequilla, margarina en barra, crema, [employer] y mitad crema, [place], [place], [place]. Marthenia Rolling MS INFORMACIN  El Dash Diet Eating Plan (Plan Nutricional Dieta Dash): www.dashdiet.org  Document Released: 06/09/2011 Document  Revised: 09/12/2011 [facility] Patient Information 2014 [place], [place].

## 2013-12-04 DIAGNOSIS — F17200 Nicotine dependence, unspecified, uncomplicated: Secondary | ICD-10-CM | POA: Insufficient documentation

## 2013-12-04 DIAGNOSIS — R200 Anesthesia of skin: Secondary | ICD-10-CM | POA: Insufficient documentation

## 2013-12-04 DIAGNOSIS — R030 Elevated blood-pressure reading, without diagnosis of hypertension: Secondary | ICD-10-CM

## 2013-12-04 DIAGNOSIS — R202 Paresthesia of skin: Secondary | ICD-10-CM

## 2013-12-04 DIAGNOSIS — IMO0001 Reserved for inherently not codable concepts without codable children: Secondary | ICD-10-CM | POA: Insufficient documentation

## 2013-12-04 DIAGNOSIS — E785 Hyperlipidemia, unspecified: Secondary | ICD-10-CM | POA: Insufficient documentation

## 2013-12-05 ENCOUNTER — Ambulatory Visit: Payer: No Typology Code available for payment source | Attending: Internal Medicine

## 2013-12-05 DIAGNOSIS — E785 Hyperlipidemia, unspecified: Secondary | ICD-10-CM

## 2013-12-05 DIAGNOSIS — E119 Type 2 diabetes mellitus without complications: Secondary | ICD-10-CM

## 2013-12-05 LAB — COMPLETE METABOLIC PANEL WITH GFR
ALBUMIN: 3.9 g/dL (ref 3.5–5.2)
ALT: 36 U/L — ABNORMAL HIGH (ref 0–35)
AST: 36 U/L (ref 0–37)
Alkaline Phosphatase: 57 U/L (ref 39–117)
BUN: 13 mg/dL (ref 6–23)
CALCIUM: 8.9 mg/dL (ref 8.4–10.5)
CO2: 24 mEq/L (ref 19–32)
Chloride: 106 mEq/L (ref 96–112)
Creat: 0.69 mg/dL (ref 0.50–1.10)
GLUCOSE: 99 mg/dL (ref 70–99)
POTASSIUM: 4.8 meq/L (ref 3.5–5.3)
Sodium: 137 mEq/L (ref 135–145)
Total Bilirubin: 0.3 mg/dL (ref 0.2–1.2)
Total Protein: 7 g/dL (ref 6.0–8.3)

## 2013-12-05 LAB — LIPID PANEL
CHOL/HDL RATIO: 4.1 ratio
CHOLESTEROL: 142 mg/dL (ref 0–200)
HDL: 35 mg/dL — ABNORMAL LOW (ref >39)
LDL Cholesterol: 84 mg/dL (ref 0–99)
Triglycerides: 113 mg/dL (ref <150)
VLDL: 23 mg/dL (ref 0–40)

## 2013-12-06 ENCOUNTER — Telehealth: Payer: Self-pay

## 2013-12-06 NOTE — Telephone Encounter (Signed)
Patient notified of lab results.  Interpretor phone line use. Patient verbalized understanding.

## 2013-12-06 NOTE — Telephone Encounter (Signed)
Message copied by Dorothe Pea on Fri Dec 06, 2013  3:41 PM ------      Message from: Lorayne Marek      Created: Fri Dec 06, 2013  9:20 AM       Call and let the Patient know that blood work is normal.       ------

## 2013-12-06 NOTE — Telephone Encounter (Signed)
Message copied by Theodis Shove on Fri Dec 06, 2013  3:33 PM ------      Message from: Lorayne Marek      Created: Fri Dec 06, 2013  9:20 AM       Call and let the Patient know that blood work is normal.       ------

## 2013-12-30 ENCOUNTER — Ambulatory Visit: Payer: No Typology Code available for payment source

## 2013-12-30 ENCOUNTER — Other Ambulatory Visit: Payer: Self-pay | Admitting: Internal Medicine

## 2013-12-30 VITALS — BP 133/80 | HR 95 | Temp 98.2°F | Resp 16

## 2013-12-30 LAB — GLUCOSE, POCT (MANUAL RESULT ENTRY): POC GLUCOSE: 228 mg/dL — AB (ref 70–99)

## 2013-12-30 MED ORDER — GABAPENTIN 300 MG PO CAPS: 300.0000 mg | ORAL_CAPSULE | Freq: Three times a day (TID) | ORAL | Status: DC

## 2013-12-30 MED ORDER — METFORMIN HCL 1000 MG PO TABS: 1000.0000 mg | ORAL_TABLET | Freq: Two times a day (BID) | ORAL | Status: DC

## 2013-12-30 MED ORDER — INSULIN ASPART PROT & ASPART (70-30 MIX) 100 UNIT/ML ~~LOC~~ SUSP
12.0000 [IU] | Freq: Two times a day (BID) | SUBCUTANEOUS | Status: DC
Start: 2013-12-30 — End: 2014-03-05

## 2013-12-30 NOTE — Patient Instructions (Signed)
Neuropata diabtica (Diabetic Neuropathy) La neuropata diabtica es una enfermedad o lesin nerviosa causada por la diabetes mellitus. Alrededor de la mitad de todas las personas que sufren diabetes mellitus tienen alguna forma de lesin nerviosa. Es ms frecuente en aquellas personas que han sufrido diabetes mellitus durante muchos aos y quienes generalmente no tienen buen control del nivel de azcar en la sangre (glucosa). La neuropata diabtica es una complicacin comn de la diabetes mellitus. Hay tres tipos ms comunes de neuropata diiabtica y un cuarto tipo que es menos frecuente y menos comprendido:   Neuropata perifrica: Es el tipo ms frecuente de neuropata diabtica. Causa lesiones en los nervios de los pies y las piernas primero y finalmente en las manos y los brazos.La lesin afecta la capacidad de sentir con el tacto.  Neuropata autonmica: este tipo causa lesiones en el sistema nervioso autnomo, que controla las siguientes funciones:  Los latidos cardacos.  La temperatura corporal.  La presin arterial.  La miccin.  La digestin.  La sudoracin.  La funcin sexual.  Neuropata focal: la neuropata focal puede ser dolorosa e impredecible y ocurre con ms frecuencia en adultos mayores con diabetes mellitus. Implica un nervio especfico o un rea y con frecuencia aparece repentinamente. Generalmente no causa problemas a largo plazo.  Neuropata diabtica proximal: tambin llamada neuropata lumbosacra, afecta los nervios de los muslos, las caderas, las nalgas o las piernas. Es ms frecuente en personas con diabetes mellitus tipo 2 y en hombre mayores. Se caracteriza por dolor debilitante, debilidad y atrofia, generalmente en los msculos de los muslos. CAUSAS  La causa de la neuropata perifrica, autonmica y focal es la diabetes mellitus no controlada y los niveles elevados de glucosa. La causa de la neuropata diabtica proximal no se conoce. Sin embargo, se  considera que la causa es una inflamacin relacionada con los niveles no controlados de glucosa. SIGNOS Y SNTOMAS  Neuropata perifrica La neuropata perifrica se desarrolla lentamente a lo largo del tiempo. Cuando los nervios de los pies y las piernas ya no funcionan puede haber:   Ardor, sensacin pulstil o dolor en las piernas o los pies.  Incapacidad para sentir presin o dolor en el pie. Las consecuencias son:  Callosidades gruesas en las zonas de presin.  Escaras.  lceras.  Deformidades en el pie.  Capacidad reducida para sentir los cambios de temperatura.  Debilidad muscular. Neuropata autnoma Los sntomas varan segn qu nervios estn afectados. Los sntomas pueden ser:  Problemas digestivos como:  Ganas de vomitar (nuseas).  Vmitos.  Hinchazn.  Estreimiento.  Diarrea.  Dolor abdominal.  Dificultad para orinar. Esto ocurre cuando se pierde la capacidad de sentir cuando la vejiga est llena. Los problemas incluyen:  Prdida de orina (incontinencia).  Imposibilidad de vaciar la vejiga completamente (retencin).  Latidos cardacos rpidos o irregulares (palpitaciones).  Descenso brusco de la presin arterial al ponerse de pie (hipotensin ortosttica). Al ponerse de pie puede sentir:  Mareos.  Debilidad.  Desmayos.  En los hombres, imposibilidad de lograr y mantener una ereccin.  En las mujeres, sequedad vaginal y problemas de disminucin del deseo sexual y la excitacin.  Problemas con la regulacin de la temperatura corporal.  Aumento o disminucin de la sudoracin. Neuropata focal  Movimientos oculares anormales o alineacin ocular anormal de ambos ojos.  Debilidad en la mueca.  Pie cado. Esto da como resultado una incapacidad para levantar el pie adecuadamente y una marcha o movimientos del pie anormales.  Parlisis de un lado del rostro (parlisis de [place]).    Dolor en el pecho o dolor abdominal. Neuropata radicular  plexual  Dolor repentino e intenso en la cadera, el muslo o las nalgas.  Debilidad y desgaste de los msculos del muslo.  Dificultad para levantarse desde una posicin de sentado.  Hinchazn abdominal.  Prdida de peso sin motivo (generalmente ms de 10 lb [4,5 kg]). DIAGNSTICO  Neuropata perifrica Le controlarn los sentidos. Las pruebas de funcin sensorial pueden realizarse con:  Un ligero toque usando un monofilamento.  La vibracin con un diapasn.  Sensacin aguda con el pinchazo de un alfiler. Otras pruebas que ayudan a diagnosticar la neuropata son:  Velocidad de conduccin nerviosa. Este estudio controla la transmisin de la corriente elctrica a travs del nervio.  Electromiograma. Muestra de qu modo los msculos responden a las seales elctricas transmitidas por los nervios que los rodean.  Prueba de sensibilidad cuantitativa. Se utiliza para evaluar de que modo los nervios responden a las vibraciones y cambios de temperatura. Neuropata autnoma El diagnstico se basa en los sntomas informados. infrmele al mdico o al profesional de la salud si siente:   Mareos.   Estreimiento.   Diarrea.   Dificultad o imposibilidad de orinar.   Imposibilidad de lograr o mantener una ereccin.  Podrn solicitarle otros estudios, por ejemplo:   Electrocardiograma o monitoreo Holter. Estos estudios muestran si hay problemas con la frecuencia y el ritmo cardacos.   Le harn radiografas. Neuropata focal El diagnstico se basa en los sntomas y en lo que el mdico encuentre durante el examen. Podrn solicitarle otros estudios. Pueden incluir:  Velocidad de conduccin nerviosa. Este estudio controla la transmisin de la corriente elctrica a travs del nervio.  Electromiograma. Muestra de qu modo los msculos responden a las seales elctricas transmitidas por los nervios que los rodean.  Prueba de sensibilidad cuantitativa. Se utiliza para evaluar de que  modo los nervios responden a las vibraciones y cambios de temperatura. Neuropataradicular plexual  Generalmente lo primero es eliminar cualquier otro motivo o problemas que pudieran ser la causa, ya que no hay exmenes para realizar este diagnstico.  Radiografa de la columna vertebral y la regin lumbar.  Puncin lumbar para descartar cncer.  Resonancia magntica para descartar otras lesiones. TRATAMIENTO  Una vez que se produce una lesin nerviosa, no puede ser revertida. El objetivo del tratamiento es impedir que la enfermedad o la lesin nerviosa empeoren y afecten ms nervios. Controlar el nivel de glucosa en la sangre es la clave. La mayora de las personas con neuropata radicular plexual ven al menos una mejora parcial con el tiempo. Deber mantener su nivel de glucosa en sangre y el nivel HbA1c en los valores determinados por su mdico. Los factores que ayudan a controlar el nivel de glucosa en sangre son:   Control del nivel de glucosa en sangre.   Planificacin de los alimentos.   Actividad fsica.   Medicamentos para la diabetes.  Con el tiempo, el mantener bajos los niveles de glucosa en la sangre ayuda a disminuir los sntomas. En algunos casos, se indican analgsicos. INSTRUCCIONES PARA EL CUIDADO EN EL HOGAR:  No fume.  Mantenga su nivel de glucosa en sangre en el rango en el que usted y su mdico han determinado que es aceptable para usted.  Mantenga su nivel de glucosa en la sangre en el rango en el que usted y su mdico han determinado que es aceptable para usted.  Consuma una dieta bien balanceada.  Sea fsicamente activo todos los das.  Revise sus pies todos   los das. SOLICITE ATENCIN MDICA SI:   Siente ardor, sensacin pulstil o dolor en las piernas o los pies.  Tiene incapacidad para sentir presin o dolor en el pie.  Aparecen problemas digestivos como:  Nuseas.  Vmitos.  Hinchazn.  Estreimiento.  Diarrea.  Dolor  abdominal.  Tiene dificultad para orinar como:  incontinencia.  Retencin.  Usted tiene palpitaciones.  Presenta hipotensin ortosttica. Al ponerse de pie puede sentir:  Mareos.  Debilidad.  Desmayarse  No puede lograr tener ni mantener una ereccin (en los hombres).  En las mujeres, sequedad vaginal y problemas de disminucin del deseo sexual y la excitacin.  Siente un dolor intenso en los muslos, las piernas o las nalgas.  Pierde peso de manera inexplicable. Document Released: 06/20/2005 Document Revised: 02/20/2013 Exit[employer] Patient Information 2015 [employer]. This information is not intended to replace advice given to you by your health care provider. Make sure you discuss any questions you have with your health care provider.  

## 2013-12-30 NOTE — Progress Notes (Unsigned)
Patient presents today with c/o numbness of face and arms. Patient states she feels like her eye and face on the right side are twitching. CBG 228. VS are stable. Patient has a referral for neurology. Notified Dr. Annitta Needs who gave a verbal order to increase Gabapentin to 300 mg three times a day and f/u with neuro. Informed patient that [facility] sent her some forms in the mail for hardship since they do not take orange. Patient states she did not get anything in the mail. Patient given contact information (phone number and address) for [facility]. Patient verbalized understanding. Vivia Birmingham, RN

## 2014-01-21 ENCOUNTER — Ambulatory Visit: Payer: No Typology Code available for payment source

## 2014-03-05 ENCOUNTER — Ambulatory Visit: Payer: Self-pay | Attending: Internal Medicine | Admitting: Internal Medicine

## 2014-03-05 ENCOUNTER — Ambulatory Visit: Payer: Self-pay | Attending: Internal Medicine

## 2014-03-05 ENCOUNTER — Encounter: Payer: Self-pay | Admitting: Internal Medicine

## 2014-03-05 VITALS — BP 137/82 | HR 100 | Temp 98.4°F | Resp 16 | Wt 260.6 lb

## 2014-03-05 DIAGNOSIS — E785 Hyperlipidemia, unspecified: Secondary | ICD-10-CM

## 2014-03-05 DIAGNOSIS — E119 Type 2 diabetes mellitus without complications: Secondary | ICD-10-CM

## 2014-03-05 DIAGNOSIS — F17200 Nicotine dependence, unspecified, uncomplicated: Secondary | ICD-10-CM | POA: Insufficient documentation

## 2014-03-05 DIAGNOSIS — K029 Dental caries, unspecified: Secondary | ICD-10-CM

## 2014-03-05 DIAGNOSIS — Z794 Long term (current) use of insulin: Secondary | ICD-10-CM | POA: Insufficient documentation

## 2014-03-05 DIAGNOSIS — E089 Diabetes mellitus due to underlying condition without complications: Secondary | ICD-10-CM

## 2014-03-05 DIAGNOSIS — E139 Other specified diabetes mellitus without complications: Secondary | ICD-10-CM

## 2014-03-05 LAB — POCT GLYCOSYLATED HEMOGLOBIN (HGB A1C): Hemoglobin A1C: 8.9

## 2014-03-05 LAB — GLUCOSE, POCT (MANUAL RESULT ENTRY): POC GLUCOSE: 243 mg/dL — AB (ref 70–99)

## 2014-03-05 MED ORDER — METFORMIN HCL 1000 MG PO TABS: 1000.0000 mg | ORAL_TABLET | Freq: Two times a day (BID) | ORAL | Status: DC

## 2014-03-05 MED ORDER — INSULIN ASPART PROT & ASPART (70-30 MIX) 100 UNIT/ML ~~LOC~~ SUSP: 12.0000 [IU] | Freq: Two times a day (BID) | SUBCUTANEOUS | Status: DC

## 2014-03-05 MED ORDER — ATORVASTATIN CALCIUM 10 MG PO TABS: 10.0000 mg | ORAL_TABLET | Freq: Every day | ORAL | Status: DC

## 2014-03-05 NOTE — Progress Notes (Signed)
Patient here for follow up on her diabetes 

## 2014-03-05 NOTE — Progress Notes (Signed)
MRN: [id number] Name: Julie Robertson  Sex: female Age: 38 y.o. DOB: [birth year]  Allergies: Review of patient's allergies indicates no known allergies.  Chief Complaint  Patient presents with  . Follow-up    HPI: Patient is 38 y.o. female who has history of diabetes hyperlipidemia comes today for followup, as per patient she has not been taking her metformin regularly but has been taking NovoLog 15 units 2 times a day, her A1c is trending up, also as per patient she has not been compliant with low-carb diet, she also history of dental cavities and requesting referral to see a dentist.  Past Medical History  Diagnosis Date  . Diabetes mellitus without complication     Past Surgical History  Procedure Laterality Date  . Tubal ligation        Medication List       This list is accurate as of: 03/05/14  5:46 PM.  Always use your most recent med list.               atorvastatin 10 MG tablet  Commonly known as:  LIPITOR  Take 1 tablet (10 mg total) by mouth daily.     gabapentin 300 MG capsule  Commonly known as:  NEURONTIN  Take 1 capsule (300 mg total) by mouth 3 (three) times daily.     glucose monitoring kit monitoring kit  1 each by Does not apply route 4 (four) times daily - after meals and at bedtime. 1 month Diabetic Testing Supplies for QAC-QHS accuchecks.     HUMULIN 70/30 (70-30) 100 UNIT/ML injection  Generic drug:  insulin NPH-regular Human  INJECT 12 UNITS UNDER THE SKIN 2 TIMES DAILY     insulin aspart protamine- aspart (70-30) 100 UNIT/ML injection  Commonly known as:  NOVOLOG MIX 70/30  Inject 0.12 mLs (12 Units total) into the skin 2 (two) times daily with a meal.     Insulin Pen Needle 31G X 5 MM Misc  Inject 10 units subcutaneous at bedtime     magnesium hydroxide 400 MG/5ML suspension  Commonly known as:  MILK OF MAGNESIA  Take 5 mLs by mouth daily as needed for mild constipation.     metFORMIN 1000 MG tablet  Commonly known as:   GLUCOPHAGE  Take 1 tablet (1,000 mg total) by mouth 2 (two) times daily with a meal.     nicotine 14 mg/24hr patch  Commonly known as:  NICODERM CQ - dosed in mg/24 hours  Place 1 patch (14 mg total) onto the skin daily.        Meds ordered this encounter  Medications  . atorvastatin (LIPITOR) 10 MG tablet    Sig: Take 1 tablet (10 mg total) by mouth daily.    Dispense:  90 tablet    Refill:  3  . metFORMIN (GLUCOPHAGE) 1000 MG tablet    Sig: Take 1 tablet (1,000 mg total) by mouth 2 (two) times daily with a meal.    Dispense:  60 tablet    Refill:  3  . insulin aspart protamine- aspart (NOVOLOG MIX 70/30) (70-30) 100 UNIT/ML injection    Sig: Inject 0.12 mLs (12 Units total) into the skin 2 (two) times daily with a meal.    Dispense:  10 mL    Refill:  2     There is no immunization history on file for this patient.  Family History  Problem Relation Age of Onset  . Diabetes Mother   . Heart  disease Mother   . Cancer Mother   . Cancer Father   . Diabetes Father   . Heart disease Father     History  Substance Use Topics  . Smoking status: Current Every Day Smoker -- 1.50 packs/day for 4 years    Types: Cigarettes  . Smokeless tobacco: Not on file  . Alcohol Use: Yes     Comment: socially    Review of Systems   As noted in HPI  Filed Vitals:   03/05/14 1718  BP: 137/82  Pulse: 100  Temp: 98.4 F (36.9 C)  Resp: 16    Physical Exam  Physical Exam  Constitutional: No distress.  Eyes: EOM are normal. Pupils are equal, round, and reactive to light.  Cardiovascular: Normal rate and regular rhythm.   Pulmonary/Chest: Breath sounds normal. No respiratory distress. She has no wheezes. She has no rales.    CBC    Component Value Date/Time   WBC 7.6 02/13/2013 1701   RBC 5.05 02/13/2013 1701   HGB 12.2 02/13/2013 1701   HCT 39.1 02/13/2013 1701   PLT 351 02/13/2013 1701   MCV 77.4* 02/13/2013 1701   LYMPHSABS 3.1 02/13/2013 1701   MONOABS 0.4 02/13/2013  1701   EOSABS 0.2 02/13/2013 1701   BASOSABS 0.0 02/13/2013 1701    CMP     Component Value Date/Time   NA 137 12/05/2013 0941   K 4.8 12/05/2013 0941   CL 106 12/05/2013 0941   CO2 24 12/05/2013 0941   GLUCOSE 99 12/05/2013 0941   BUN 13 12/05/2013 0941   CREATININE 0.69 12/05/2013 0941   CREATININE 0.84 05/14/2010 2135   CALCIUM 8.9 12/05/2013 0941   PROT 7.0 12/05/2013 0941   ALBUMIN 3.9 12/05/2013 0941   AST 36 12/05/2013 0941   ALT 36* 12/05/2013 0941   ALKPHOS 57 12/05/2013 0941   BILITOT 0.3 12/05/2013 0941   GFRNONAA >89 12/05/2013 0941   GFRAA >89 12/05/2013 0941    Lab Results  Component Value Date/Time   CHOL 142 12/05/2013  9:41 AM    No components found with this basename: hga1c    Lab Results  Component Value Date/Time   AST 36 12/05/2013  9:41 AM    Assessment and Plan  Diabetes mellitus due to underlying condition without complications - Plan:  Results for orders placed in visit on 03/05/14  GLUCOSE, POCT (MANUAL RESULT ENTRY)      Result Value Ref Range   POC Glucose 243 (*) 70 - 99 mg/dl  POCT GLYCOSYLATED HEMOGLOBIN (HGB A1C)      Result Value Ref Range   Hemoglobin A1C 8.9     A1c is trending up, I have advised patient for diabetes meal planning, she'll continue with current dose of her insulin, advise patient to take metformin regularly, will repeat A1c in 3 months.  Other and unspecified hyperlipidemia Patient is currently on Lipitor 10 mg, will repeat fasting lipid panel on the next visit.  Dental cavities - Plan: Ambulatory referral to Dentistry  Type 2 diabetes mellitus without complication - Plan: metFORMIN (GLUCOPHAGE) 1000 MG tablet, insulin aspart protamine- aspart (NOVOLOG MIX 70/30) (70-30) 100 UNIT/ML injection    Return in about 3 months (around 06/04/2014).  Lorayne Marek, MD

## 2014-07-23 ENCOUNTER — Telehealth: Payer: Self-pay | Admitting: Internal Medicine

## 2014-07-23 ENCOUNTER — Other Ambulatory Visit: Payer: Self-pay | Admitting: Internal Medicine

## 2014-07-23 ENCOUNTER — Telehealth: Payer: Self-pay

## 2014-07-23 DIAGNOSIS — E119 Type 2 diabetes mellitus without complications: Secondary | ICD-10-CM

## 2014-07-23 MED ORDER — INSULIN ASPART PROT & ASPART (70-30 MIX) 100 UNIT/ML ~~LOC~~ SUSP: 12.0000 [IU] | Freq: Two times a day (BID) | SUBCUTANEOUS | Status: DC

## 2014-07-23 MED ORDER — METFORMIN HCL 1000 MG PO TABS: 1000.0000 mg | ORAL_TABLET | Freq: Two times a day (BID) | ORAL | Status: DC

## 2014-07-23 NOTE — Telephone Encounter (Signed)
Patient's daughter was calling for refills on her diabetes medications Medications have already been filled and sent to the pharmacy

## 2014-07-23 NOTE — Telephone Encounter (Signed)
Patient has some questions about her medications. Please f/u with pt.

## 2014-07-23 NOTE — Telephone Encounter (Signed)
Pt advice need office visit F/U DM  Rx refills send to [facility]

## 2014-08-04 ENCOUNTER — Ambulatory Visit: Payer: Self-pay | Admitting: Internal Medicine

## 2014-08-18 ENCOUNTER — Ambulatory Visit: Payer: Self-pay

## 2014-08-20 ENCOUNTER — Ambulatory Visit: Payer: Self-pay

## 2014-08-20 ENCOUNTER — Other Ambulatory Visit: Payer: Self-pay | Admitting: *Deleted

## 2014-08-20 DIAGNOSIS — E119 Type 2 diabetes mellitus without complications: Secondary | ICD-10-CM

## 2014-08-20 MED ORDER — INSULIN ASPART PROT & ASPART (70-30 MIX) 100 UNIT/ML ~~LOC~~ SUSP: 12.0000 [IU] | Freq: Two times a day (BID) | SUBCUTANEOUS | Status: DC

## 2014-09-15 ENCOUNTER — Ambulatory Visit: Payer: Self-pay | Attending: Internal Medicine

## 2014-09-15 ENCOUNTER — Ambulatory Visit: Payer: Self-pay | Attending: Internal Medicine | Admitting: Internal Medicine

## 2014-09-15 ENCOUNTER — Encounter: Payer: Self-pay | Admitting: Internal Medicine

## 2014-09-15 VITALS — BP 114/80 | HR 87 | Temp 98.0°F | Resp 16 | Wt 262.2 lb

## 2014-09-15 DIAGNOSIS — E139 Other specified diabetes mellitus without complications: Secondary | ICD-10-CM

## 2014-09-15 DIAGNOSIS — Z794 Long term (current) use of insulin: Secondary | ICD-10-CM | POA: Insufficient documentation

## 2014-09-15 DIAGNOSIS — E785 Hyperlipidemia, unspecified: Secondary | ICD-10-CM | POA: Insufficient documentation

## 2014-09-15 DIAGNOSIS — E1165 Type 2 diabetes mellitus with hyperglycemia: Secondary | ICD-10-CM | POA: Insufficient documentation

## 2014-09-15 DIAGNOSIS — Z720 Tobacco use: Secondary | ICD-10-CM

## 2014-09-15 DIAGNOSIS — F17210 Nicotine dependence, cigarettes, uncomplicated: Secondary | ICD-10-CM | POA: Insufficient documentation

## 2014-09-15 DIAGNOSIS — F17200 Nicotine dependence, unspecified, uncomplicated: Secondary | ICD-10-CM

## 2014-09-15 DIAGNOSIS — Z23 Encounter for immunization: Secondary | ICD-10-CM | POA: Insufficient documentation

## 2014-09-15 LAB — POCT GLYCOSYLATED HEMOGLOBIN (HGB A1C): Hemoglobin A1C: 10.9

## 2014-09-15 LAB — GLUCOSE, POCT (MANUAL RESULT ENTRY): POC GLUCOSE: 283 mg/dL — AB (ref 70–99)

## 2014-09-15 MED ORDER — GABAPENTIN 300 MG PO CAPS: 300.0000 mg | ORAL_CAPSULE | Freq: Three times a day (TID) | ORAL | Status: DC

## 2014-09-15 MED ORDER — ATORVASTATIN CALCIUM 10 MG PO TABS: 10.0000 mg | ORAL_TABLET | Freq: Every day | ORAL | Status: DC

## 2014-09-15 MED ORDER — METFORMIN HCL 1000 MG PO TABS: 1000.0000 mg | ORAL_TABLET | Freq: Two times a day (BID) | ORAL | Status: DC

## 2014-09-15 MED ORDER — INSULIN NPH ISOPHANE & REGULAR (70-30) 100 UNIT/ML ~~LOC~~ SUSP: SUBCUTANEOUS | Status: DC

## 2014-09-15 MED ORDER — INSULIN ASPART 100 UNIT/ML ~~LOC~~ SOLN
10.0000 [IU] | Freq: Once | SUBCUTANEOUS | Status: AC
  Administered 2014-09-15: 10 [IU] via SUBCUTANEOUS

## 2014-09-15 NOTE — Progress Notes (Signed)
Patient here for follow up on her diabetes and for medication refills 

## 2014-09-15 NOTE — Progress Notes (Signed)
MRN: [id number] Name: Julie Robertson  Sex: female Age: 39 y.o. DOB: [birth year]  Allergies: Review of patient's allergies indicates no known allergies.  Chief Complaint  Patient presents with  . Follow-up    HPI: Patient is 39 y.o. female who has history of diabetes, hyperlipidemia comes today for followup as per patient she did not of her medication metformin recently but has been taking insulin 15 units 2 times a day, she denies any hypoglycemic symptoms, her hemoglobin A1c has trended up,patient is to smoke cigarettes, I have counseled patient to quit smoking.today she is requesting refill on her medications.  Past Medical History  Diagnosis Date  . Diabetes mellitus without complication     Past Surgical History  Procedure Laterality Date  . Tubal ligation        Medication List       This list is accurate as of: 09/15/14  6:12 PM.  Always use your most recent med list.               atorvastatin 10 MG tablet  Commonly known as:  LIPITOR  Take 1 tablet (10 mg total) by mouth daily.     gabapentin 300 MG capsule  Commonly known as:  NEURONTIN  Take 1 capsule (300 mg total) by mouth 3 (three) times daily.     glucose monitoring kit monitoring kit  1 each by Does not apply route 4 (four) times daily - after meals and at bedtime. 1 month Diabetic Testing Supplies for QAC-QHS accuchecks.     insulin aspart protamine- aspart (70-30) 100 UNIT/ML injection  Commonly known as:  NOVOLOG MIX 70/30  Inject 0.12 mLs (12 Units total) into the skin 2 (two) times daily with a meal.     insulin NPH-regular Human (70-30) 100 UNIT/ML injection  Commonly known as:  HUMULIN 70/30  INJECT 17 UNITS UNDER THE SKIN 2 TIMES DAILY     Insulin Pen Needle 31G X 5 MM Misc  Inject 10 units subcutaneous at bedtime     magnesium hydroxide 400 MG/5ML suspension  Commonly known as:  MILK OF MAGNESIA  Take 5 mLs by mouth daily as needed for mild constipation.     metFORMIN  1000 MG tablet  Commonly known as:  GLUCOPHAGE  Take 1 tablet (1,000 mg total) by mouth 2 (two) times daily with a meal.     nicotine 14 mg/24hr patch  Commonly known as:  NICODERM CQ - dosed in mg/24 hours  Place 1 patch (14 mg total) onto the skin daily.        Meds ordered this encounter  Medications  . insulin aspart (novoLOG) injection 10 Units    Sig:   . atorvastatin (LIPITOR) 10 MG tablet    Sig: Take 1 tablet (10 mg total) by mouth daily.    Dispense:  90 tablet    Refill:  3  . gabapentin (NEURONTIN) 300 MG capsule    Sig: Take 1 capsule (300 mg total) by mouth 3 (three) times daily.    Dispense:  90 capsule    Refill:  3  . insulin NPH-regular Human (HUMULIN 70/30) (70-30) 100 UNIT/ML injection    Sig: INJECT 17 UNITS UNDER THE SKIN 2 TIMES DAILY    Dispense:  10 mL    Refill:  2  . metFORMIN (GLUCOPHAGE) 1000 MG tablet    Sig: Take 1 tablet (1,000 mg total) by mouth 2 (two) times daily with a meal.    Dispense:  60 tablet    Refill:  3    Immunization History  Administered Date(s) Administered  . Influenza,inj,Quad PF,36+ Mos 09/15/2014  . Pneumococcal Polysaccharide-23 09/15/2014    Family History  Problem Relation Age of Onset  . Diabetes Mother   . Heart disease Mother   . Cancer Mother   . Cancer Father   . Diabetes Father   . Heart disease Father     History  Substance Use Topics  . Smoking status: Current Every Day Smoker -- 1.50 packs/day for 4 years    Types: Cigarettes  . Smokeless tobacco: Not on file  . Alcohol Use: Yes     Comment: socially    Review of Systems   As noted in HPI  Filed Vitals:   09/15/14 1655  BP: 114/80  Pulse: 87  Temp: 98 F (36.7 C)  Resp: 16    Physical Exam  Physical Exam  Constitutional: No distress.  Eyes: EOM are normal. Pupils are equal, round, and reactive to light.  Cardiovascular: Normal rate and regular rhythm.   Pulmonary/Chest: Breath sounds normal. No respiratory distress. She has  no wheezes. She has no rales.  Musculoskeletal: She exhibits no edema.    CBC    Component Value Date/Time   WBC 7.6 02/13/2013 1701   RBC 5.05 02/13/2013 1701   HGB 12.2 02/13/2013 1701   HCT 39.1 02/13/2013 1701   PLT 351 02/13/2013 1701   MCV 77.4* 02/13/2013 1701   LYMPHSABS 3.1 02/13/2013 1701   MONOABS 0.4 02/13/2013 1701   EOSABS 0.2 02/13/2013 1701   BASOSABS 0.0 02/13/2013 1701    CMP     Component Value Date/Time   NA 137 12/05/2013 0941   K 4.8 12/05/2013 0941   CL 106 12/05/2013 0941   CO2 24 12/05/2013 0941   GLUCOSE 99 12/05/2013 0941   BUN 13 12/05/2013 0941   CREATININE 0.69 12/05/2013 0941   CREATININE 0.84 05/14/2010 2135   CALCIUM 8.9 12/05/2013 0941   PROT 7.0 12/05/2013 0941   ALBUMIN 3.9 12/05/2013 0941   AST 36 12/05/2013 0941   ALT 36* 12/05/2013 0941   ALKPHOS 57 12/05/2013 0941   BILITOT 0.3 12/05/2013 0941   GFRNONAA >89 12/05/2013 0941   GFRAA >89 12/05/2013 0941    Lab Results  Component Value Date/Time   CHOL 142 12/05/2013 09:41 AM    No components found for: HGA1C  Lab Results  Component Value Date/Time   AST 36 12/05/2013 09:41 AM    Assessment and Plan  Other specified diabetes mellitus without complications - Plan:  Results for orders placed or performed in visit on 09/15/14  Glucose (CBG)  Result Value Ref Range   POC Glucose 283.0 (A) 70 - 99 mg/dl  HgB A1c  Result Value Ref Range   Hemoglobin A1C 10.9    Diabetes is uncontrolled, I have increased the dose of insulin to 17 units 2 times a day, resume back on metformin, advise patient for diabetes meal planning, will repeat A1c in 3 months , HgB A1c, insulin aspart (novoLOG) injection 10 Units, insulin NPH-regular Human (HUMULIN 70/30) (70-30) 100 UNIT/ML injection, metFORMIN (GLUCOPHAGE) 1000 MG tablet  Hyperlipidemia - Plan:Currently patient is on atorvastatin (LIPITOR) 10 MG tablet, will repeat fasting lipid panel on the next visit  Smoking Advised patient to  quit smoking.  Needs flu shot  Need for prophylactic vaccination against Streptococcus pneumoniae (pneumococcus) - Plan: Pneumococcal polysaccharide vaccine 23-valent greater than or equal to 2yo subcutaneous/IM  Health Maintenance  -Pap Smear:will be scheduled  -Vaccinations:  Flu shot and Pneumovax given today.  Return in about 3 months (around 12/16/2014) for diabetes, hyperipidemia, Schedule Appt with Dr Burman Freestone for PAP.   This note has been created with Surveyor, quantity. Any transcriptional errors are unintentional.    Lorayne Marek, MD

## 2014-09-15 NOTE — Patient Instructions (Signed)
Dieta para el control del colesterol y las grasas   (Fat and Cholesterol Control Diet)  Los niveles de grasa y colesterol en la sangre y en los órganos se ven influidos por la dieta. Los niveles altos de grasa y colesterol pueden conducir a enfermedades del corazón, de los pequeños y los grandes vasos sanguíneos, de la vesícula biliar, el hígado y el páncreas.   CONTROL DE LA GRASA Y EL COLESTEROL CON LA DIETA   Aunque el ejercicio y el estilo de vida son factores importantes, su dieta es la clave. Esto se debe a que se sabe que ciertos alimentos hacen subir el colesterol y otros lo bajan. El objetivo debe ser equilibrar los alimentos, de modo que tengan un efecto sobre el colesterol y, aún más importante, reemplazar las grasas saturadas y trans con otros tipos de grasas, como las monoinsaturadas y las poliinsaturadas y ácidos grasos omega-3.   En promedio, una persona no debe consumir más de 15 a 17 g de grasas saturadas por día. Las grasas saturadas y trans se consideran grasas "malas", ya que elevan el colesterol LDL. Las grasas saturadas se encuentran principalmente en productos animales como carne, manteca y crema. Sin embargo, eso no significa que tenga que renunciar a todas sus comidas favoritas. Actualmente, hay buenos sustitutos bajos en colesterol, bajos en grasas para la mayoría de las cosas que le gusta comer. Elija aquellos alimentos alternativos que sean bajos en grasas o sin grasas. Elija cortes de peceto o lomo de carne roja. Estos tipos de cortes contienen menos grasa y colesterol. Pollo (sin la piel), pescado, ternera y pechuga de pavo molida son excelentes opciones. Eliminar las carnes grasas, como las salchichas y el salame. Los mariscos contienen poca o casi nada de grasas saturadas. Consuma una porción de 3 oz (85 g) de carne magra, aves o pescado.   Las grasas trans también se llaman "aceites parcialmente hidrogenados". Son aceites manipulados científicamente de modo que son sólidos a  temperatura ambiente, tienen una larga vida y mejoran el sabor y la textura de los alimentos a los que se agregan. Las grasas trans se encuentran en la margarina, masitas, crackers y alimentos horneados.   Al hornear y cocinar, el aceite es un buen sustituto de la manteca. Los aceites monoinsaturados son beneficiosos, sobre todo porque se cree que reducen el colesterol LDL y aumentan el HDL. Los aceites que hay que evitar completamente son los aceites tropicales saturados, como el de coco y palma.   Recuerde consumir una gran cantidad de alimentos de los grupos que están naturalmente libres de grasas saturadas y grasas trans, e incluya pescado, frutas, verduras, frijoles, granos (cebada, arroz, cuscús, trigo bulgur) y pastas (sin salsas de crema).   IDENTIFICACIÓN DE LOS ALIMENTOS QUE REDUCEN LAS GRASAS Y ELCOLESTEROL   La fibra soluble puede reducir el colesterol. Este tipo de fibra se encuentra en las frutas como manzanas, verduras como el brócoli, papas y zanahorias, las legumbres como los frijoles, guisantes y lentejas y granos como la cebada. Los alimentos enriquecidos con esteroles vegetales (fitosteroles) también pueden reducir el colesterol. Consuma al menos 2 g por día de estos alimentos para un efecto de disminución del colesterol.   Lea las etiquetas de los paquetes para identificar los alimentos bajos en grasas saturadas, en grasas trans y los bajos en grasas en el supermercado. Seleccione los quesos que tienen sólo 2 a 3 g de grasa saturada por onza. Utilice margarina saludable para el corazón que sea libre de grasas trans o   deben evitar los aceites parcialmente hidrogenados. Panes y panecillos deben hacerse con cereales integrales (trigo integral o harina de avena integral en lugar de " harina " o " harina enriquecida ") Compre sopas en lata que no sean cremosas, con bajo contenido de sal y sin grasas  adicionadas.  TCNICAS DE PREPARACIN DE LOS ALIMENTOS  Nunca prepare los alimentos fritos. Si usted debe frer, [employer] en muy poca grasa o use un aerosol de cocina anti adherente. Siempre que sea posible, debe hervir, hornear o asar las carnes y preparar las verduras al vapor. En lugar de agregar mantequilla o margarina a las verduras, use limn y hierbas, pur de [place] y canela (para la calabaza y la batata). Utilice yogur natural sin grasa, salsas y aderezos bajos en grasa para ensaladas.  BAJO EN GRASAS SATURADAS / SUSTITUTOS BAJOS EN GRASA  Carnes / grasas saturadas (g)  Evite: Bife, veteado (3 oz/85 g) / 11 g  Elija: Bife, magro(3 oz/85 g) / 4 g  Evite: Hamburguesa (3 oz/85 g) / 7 g  Elija: Hamburguesa, magra (3 oz/85 g) / 5 g  Evite: Jamn (3 oz/85 g) / 6 g  Elija: Jamn, corte magro (3 oz/85 g) / 2,4 g  Evite: Pollo con piel, carne oscura (3 oz/85 g) / 4 g  Elija: Pollo, sin piel, carne oscura (3 oz/85 g) / 2 g  Evite: Pollo con piel, carne blanca (3 oz/85 g) / 2,5 g  Elija: Pollo, sin piel, carne blanca (3 oz/85 g) / 1 g Lcteos / Grasa saturada (g)   Evite: Leche entera (1 taza) / 5 g  Elija: Leche descremada, 2% (1 taza) / 3 g  Elija: Leche descremada, 1% (1 taza) / 1,5 g  Elija: Leche descremada, 1 taza (0,3 g).  Evite: Queso duro (1 oz/28 g) / 6 g  Elija: Queso de [employer] (1 oz/28 g) / 2 a 3 g  Evite: Queso cottage, 4% de grasa (1 taza) / 6,5 g  Elija: Queso cottage bajo en grasa, 1% de grasa (1 taza) / 1,5 g  Evite: Helado (1 taza) / 9 g  Elija: Sorbete (1 taza) / 2,5 g  Elija: Yogur congelado descremado (1 taza) / 0,3 g  Elija: Barra de frutas congeladas / trace  Evite: Crema batida (1 cucharada) / 3,5 g  Elija: Crema batida no lctea (1 cucharada) / 1 g Condimentos / Grasas Saturadas (g)   Evite: Mayonesa (1 cucharada) / 2 g  Elija: Mayonesa baja en grasa (1 cucharada) / 1 g  Evite: Mantequilla (1 cucharada) / 7  g  Elija: Margarina light extra (1 cucharada) / 1 g  Evite: Aceite de coco (1 cucharada) / 11,8 g  Elija: Aceite de oliva (1 cucharada) / 1,8 g  Elija: Aceite de maz (1 cucharada) / 1,7 g  Elija: Aceite de crtamo (1 cucharada) / 1,2 g  Elija: Aceite de girasol (1 cucharada) / 1,4 g  Elija: Aceite de soja (1 cucharada) / 0 mg / 2,4 g  Elija: Aceite de canola (1 cucharada) / 0 mg / 1 g Document Released: 06/20/2005 Document Revised: 10/15/2012 ExitCare Patient Information 2015 [place], [place]. This information is not intended to replace advice given to you by your health care provider. Make sure you discuss any questions you have with your health care provider. La diabetes mellitus y los alimentos (Diabetes Mellitus and Food) Es importante que controle su nivel de azcar en la sangre (glucosa). El nivel de glucosa en [place]  depende en gran medida de lo que usted come. [place] alimentos saludables en las cantidades [place] a lo largo del Training and development officer, aproximadamente a la misma hora [employer], lo ayudar a Chief Technology Officer su nivel de Multimedia programmer. Tambin puede ayudarlo a retrasar o Patent attorney de la diabetes mellitus. [place]er de [employer] saludable incluso puede ayudarlo a Chartered loss adjuster de presin arterial y a Science writer o Theatre manager un peso saludable.  CMO PUEDEN AFECTARME LOS ALIMENTOS? Carbohidratos Los carbohidratos afectan el nivel de glucosa en sangre ms que cualquier otro tipo de alimento. El nutricionista lo ayudar a Teacher, adult education cuntos carbohidratos puede consumir en cada comida y ensearle a contarlos. El recuento de carbohidratos es importante para mantener la glucosa en sangre en un nivel saludable, en especial si utiliza insulina o toma determinados medicamentos para la diabetes mellitus. Alcohol El alcohol puede provocar disminuciones sbitas de la glucosa en sangre (hipoglucemia), en especial si utiliza insulina o toma determinados medicamentos para la diabetes mellitus.  La hipoglucemia es una afeccin que puede poner en peligro la vida. Los sntomas de la hipoglucemia (somnolencia, mareos y Data processing manager) son similares a los sntomas de haber consumido mucho alcohol.  Si el mdico lo autoriza a beber alcohol, hgalo con moderacin y siga estas pautas:  Las mujeres no deben beber ms de un trago por da, y los hombres no deben beber ms de dos tragos por Training and development officer. Un trago es igual a:  12 onzas (355 ml) de cerveza  5 onzas de vino (150 ml) de vino  1,5onzas (66ml) de bebidas espirituosas  No beba con el estmago vaco.  Mantngase hidratado. Beba agua, gaseosas dietticas o t helado sin azcar.  Las gaseosas comunes, los jugos y otros refrescos podran contener muchos carbohidratos y se Civil Service fast streamer. QU ALIMENTOS NO SE RECOMIENDAN? Cuando haga las elecciones de alimentos, es importante que recuerde que todos los alimentos son distintos. Algunos tienen menos nutrientes que otros por porcin, aunque podran tener la misma cantidad de caloras o carbohidratos. Es difcil darle al cuerpo lo que necesita cuando consume alimentos con menos nutrientes. Estos son algunos ejemplos de alimentos que debera evitar ya que contienen muchas caloras y carbohidratos, pero pocos nutrientes:  Physicist, medical trans (la mayora de los alimentos procesados incluyen grasas trans en la etiqueta de Informacin nutricional).  Gaseosas comunes.  Jugos.  Caramelos.  Dulces, como tortas, pasteles, rosquillas y [place].  Comidas fritas. QU ALIMENTOS PUEDO COMER? Consuma alimentos ricos en nutrientes, que nutrirn el cuerpo y lo mantendrn saludable. Los alimentos que debe comer tambin dependern de varios factores, como:  Las caloras que necesita.  Los medicamentos que toma.  Su peso.  El nivel de glucosa en [place].  El [place] de presin arterial.  El nivel de colesterol. Tambin debe consumir una variedad de [place], como:  Protenas, como carne, aves, pescado,  tofu, frutos secos y semillas (las protenas de [place] magros son mejores).  Lambert Mody.  Verduras.  Productos lcteos, como [place], queso y yogur (descremados son mejores).  Panes, granos, pastas, cereales, arroz y frijoles.  Grasas, como aceite de [place], [place] sin grasas trans, aceite de canola, aguacate y [place]. TODOS LOS QUE PADECEN DIABETES MELLITUS TIENEN EL [place] PLAN DE [place]? Dado que todas las personas que padecen diabetes mellitus son distintas, no hay un solo plan de comidas que funcione para todos. Es muy importante que se rena con un nutricionista que lo ayudar a crear un plan de comidas adecuado para usted. Document Released: 09/27/2007 Document Revised: 06/25/2013 ExitCare Patient  Information 2015 [employer]. This information is not intended to replace advice given to you by your health care provider. Make sure you discuss any questions you have with your health care provider.  

## 2014-09-17 ENCOUNTER — Ambulatory Visit: Payer: Self-pay

## 2014-09-23 ENCOUNTER — Other Ambulatory Visit: Payer: Self-pay | Admitting: Family Medicine

## 2014-09-26 ENCOUNTER — Other Ambulatory Visit: Payer: Self-pay

## 2014-10-02 ENCOUNTER — Telehealth: Payer: Self-pay | Admitting: Internal Medicine

## 2014-10-02 ENCOUNTER — Ambulatory Visit: Payer: Self-pay | Attending: Internal Medicine | Admitting: Physician Assistant

## 2014-10-02 VITALS — BP 120/80 | HR 85 | Temp 98.6°F | Resp 16 | Ht 64.0 in | Wt 262.0 lb

## 2014-10-02 DIAGNOSIS — J209 Acute bronchitis, unspecified: Secondary | ICD-10-CM

## 2014-10-02 MED ORDER — ALBUTEROL SULFATE (2.5 MG/3ML) 0.083% IN NEBU: 2.5000 mg | INHALATION_SOLUTION | Freq: Four times a day (QID) | RESPIRATORY_TRACT | Status: DC | PRN

## 2014-10-02 MED ORDER — GUAIFENESIN-CODEINE 100-10 MG/5ML PO SOLN: 5.0000 mL | ORAL | Status: DC | PRN

## 2014-10-02 MED ORDER — ALBUTEROL SULFATE HFA 108 (90 BASE) MCG/ACT IN AERS: 2.0000 | INHALATION_SPRAY | Freq: Four times a day (QID) | RESPIRATORY_TRACT | Status: DC | PRN

## 2014-10-02 MED ORDER — AZITHROMYCIN 250 MG PO TABS: ORAL_TABLET | ORAL | Status: DC

## 2014-10-02 NOTE — Telephone Encounter (Signed)
Patient called to make an appointment for her PCP, she stated that she has been sick for several days. Patient was recommended to come into our Walk In clinic to be triaged but patient stated that she would only like to see her doctor for care.

## 2014-10-02 NOTE — Addendum Note (Signed)
Addended byBrayton Caves on: 10/02/2014 04:15 PM   Modules accepted: Orders, Medications

## 2014-10-02 NOTE — Progress Notes (Signed)
Chief Complaint: cough  Subjective: Is a 39 year old Hispanic female who presents to the walk-in clinic with 4 days of increasing cough. It is mostly dry. No sputum production. She has associated pain in her throat. She also has some chest discomfort. She gets short of breath and dyspneic with exertion. She has been trying some over-the-counter Robitussin with minimal relief. She does have some nasal congestion. She denies fevers or chills. She feels like she wheezes especially at night.   ROS:  GEN: denies fever or chills, denies change in weight Skin: denies lesions or rashes HEENT: + headache, no earache, epistaxis, + sore throat, no neck pain LUNGS: + SHOB, dyspnea, PND, orthopnea CV: denies CP or palpitations   Objective:  Filed Vitals:   10/02/14 1415  BP: 120/80  Pulse: 85  Temp: 98.6 F (37 C)  Resp: 16  Height: 5' 4"  (1.626 m)  Weight: 262 lb (118.842 kg)  SpO2: 96%    Physical Exam:  General: in no acute distress. HEENT: no pallor, no icterus, moist oral mucosa, no JVD, no lymphadenopathy Heart: Normal  s1 &s2  Regular rate and rhythm, without murmurs, rubs, gallops. Lungs: Clear to auscultation bilaterally, no wheezes presently    Medications: Prior to Admission medications   Medication Sig Start Date End Date Taking? Authorizing Provider  atorvastatin (LIPITOR) 10 MG tablet Take 1 tablet (10 mg total) by mouth daily. 09/15/14  Yes Lorayne Marek, MD  glucose monitoring kit (FREESTYLE) monitoring kit 1 each by Does not apply route 4 (four) times daily - after meals and at bedtime. 1 month Diabetic Testing Supplies for QAC-QHS accuchecks. 07/03/13  Yes Lorayne Marek, MD  insulin NPH-regular Human (HUMULIN 70/30) (70-30) 100 UNIT/ML injection INJECT 17 UNITS UNDER THE SKIN 2 TIMES DAILY 09/15/14  Yes Lorayne Marek, MD  Insulin Pen Needle 31G X 5 MM MISC Inject 10 units subcutaneous at bedtime 07/03/13  Yes Lorayne Marek, MD  metFORMIN (GLUCOPHAGE) 1000 MG tablet  Take 1 tablet (1,000 mg total) by mouth 2 (two) times daily with a meal. 09/15/14  Yes Deepak Advani, MD  albuterol (PROVENTIL) (2.5 MG/3ML) 0.083% nebulizer solution Take 3 mLs (2.5 mg total) by nebulization every 6 (six) hours as needed for wheezing or shortness of breath. 10/02/14   Brayton Caves, PA-C  azithromycin (ZITHROMAX Z-PAK) 250 MG tablet Bronchitis 10/02/14   Brayton Caves, PA-C  gabapentin (NEURONTIN) 300 MG capsule Take 1 capsule (300 mg total) by mouth 3 (three) times daily. Patient not taking: Reported on 10/02/2014 09/15/14   Lorayne Marek, MD  guaiFENesin-codeine 100-10 MG/5ML syrup Take 5 mLs by mouth every 4 (four) hours as needed for cough. 10/02/14   Brayton Caves, PA-C  magnesium hydroxide (MILK OF MAGNESIA) 400 MG/5ML suspension Take 5 mLs by mouth daily as needed for mild constipation. Patient not taking: Reported on 10/02/2014 07/03/13   Lorayne Marek, MD  nicotine (NICODERM CQ - DOSED IN MG/24 HOURS) 14 mg/24hr patch Place 1 patch (14 mg total) onto the skin daily. Patient not taking: Reported on 10/02/2014 07/03/13   Lorayne Marek, MD    Assessment: 1. Acute bronchitis 2. Upper respiratory infection 3. Smoker  Plan: Z-Pak Guaifenesin cough syrup with codeine Albuterol inhaler as needed Smoking cessation encouraged  Follow up: As scheduled for routine health maintenance or sooner if symptoms not relieved  The patient was given clear instructions to go to ER or return to medical center if symptoms don't improve, worsen or new problems develop. The patient verbalized  understanding. The patient was told to call to get lab results if they haven't heard anything in the next week.   This note has been created with Surveyor, quantity. Any transcriptional errors are unintentional.   Zettie Pho, PA-C 10/02/2014, 2:31 PM

## 2014-10-02 NOTE — Progress Notes (Signed)
[place] ID [phone] used for this encounter  Patient walked in for same day appointment symptoms include: coughing Headache Sore throat   Symptoms began 4 days ago Patient reports she has been eating wet dirt-she has been having this craving since she was a little girl

## 2014-10-10 ENCOUNTER — Other Ambulatory Visit: Payer: Self-pay | Admitting: Family Medicine

## 2014-10-17 ENCOUNTER — Ambulatory Visit: Payer: Self-pay | Attending: Internal Medicine | Admitting: Family Medicine

## 2014-10-17 VITALS — BP 115/81 | HR 85 | Temp 97.5°F | Resp 16

## 2014-10-17 DIAGNOSIS — S93601A Unspecified sprain of right foot, initial encounter: Secondary | ICD-10-CM

## 2014-10-17 NOTE — Progress Notes (Signed)
Patient ID: Bryony Kaman, female   DOB: May 06, 1976, 39 y.o.   MRN: [id number]    CC: Foot Pain  HPI:  Patient tripped on steps yesterday and twisted right foot. There is pain in the lateral foot w/o ankle involvement. She has been taking advil, using an ace bandage and has crutches that she used yesterday. She has not needed those today. She has a friend who has offered a foot massage but she wants to make sure it is not broken.   EXAM: There is a tiny, almost imperceptual amount of swelling of the superior/lateral right foot, with tenderness to palpation and ROM. There is no point tenderness over the right lateral malleous and no pain of the ankle with ROM.    ASSESSMENT:  Foot sprain   PLAN:  Ice. Elevation\ Ace if desired Crutches if desired Massage if desired. Ibuprofen or aleve as desired. Follow- up as needed. I have given her a handout on sprain foot in Spanish.     Micheline Chapman, FNP-BC [facility] [phone]

## 2014-10-17 NOTE — Progress Notes (Signed)
Patient fell and twisted her right ankle yesterday and is having trouble ambulating.  She says it is an 8/10 pain.  Swelling is present.  Pain radiates toward toes

## 2014-10-17 NOTE — Patient Instructions (Signed)
Esguince del pie (Foot Sprain) Los msculos y los tendones (estructuras similares a cuerdas que unen el msculo al hueso) que rodean el pie estn formados por unidades. El esguince se produce en el punto ms dbil de esas unidades. Este trastorno generalmente est ocasionado por una lesin o un uso excesivo del pie, como por ejemplo en la prctica de deportes, o cuando se agrava una lesin anterior, o debido a condiciones deficientes, o en casos de obesidad. SNTOMAS   Dolor con Actuary.  Sensibilidad e hinchazn de la zona lesionada.  Prdida de la fuerza en los esguinces moderados o graves. LOS TRES GRADOS DE GRAVEDAD DEL ESGUINCE DEL PIE SON:   Leve Alan Ripper I): El msculo ligeramente desgarrado, sin ruptura de fibras musculares o tendones ni prdida de la fuerza.  Moderado Alan Ripper II): Ruptura de las fibras musculares, del tendn o de la unin al [place], con leve disminucin de la fuerza.  Grave Alan Ripper III): Ruptura de la unin msculo-tendn-hueso, con separacin de las fibras. El esguince grave requiere reparacin [place]. Los esguinces crnicos (que se repiten a menudo), estn causados por el uso excesivo. Los esguinces agudos (repentinos) estn ocasionados por una lesin directa o por el uso excesivo. DIAGNSTICO El diagnstico de este problema generalmente se hace por observacin. Si el problema persiste, el profesional que lo asiste podr requerir una evaluacin ms profunda y Lexicographer. Podrn solicitarle radiografas para comprobar que no ha sufrido Doctor, hospital (rotura de los [place]). Si los problemas continan podr necesitar un tratamiento de fisioterapia. PREVENCIN  Practique los ejercicios de entrenamiento y de fuerza adecuados para el deporte que usted Visual merchandiser.  Haga un correcto calentamiento antes de comenzar la actividad fsica.  Use las zapatillas que fueron diseadas para el deporte que Geologist, engineering.  Permita que pase el suficiente tiempo para que pueda  curarse. Si regresa a la actividad antes de tiempo ser ms susceptible de Pharmacist, community, y esto puede conducirlo a un pie artrtico inestable que tendr como resultado una discapacidad prolongada. Los esguinces leves generalmente tardan entre 3 y 2 das para curarse y los moderados y graves entre 2 y 48 semanas. El profesional que lo asiste puede ayudarlo a Actor tiempo apropiado que requerir la curacin. INSTRUCCIONES PARA EL CUIDADO DOMICILIARIO  Aplique hielo en la lesin durante 15 a 20 minutos, 3 a 4 veces por da. Ponga el hielo en una bolsa plstica y coloque una toalla entre la bolsa y la piel.  Puede usar Management consultant (como un vendaje Ace) para disminuir la hinchazn.  Mantenga el pie por encima del nivel del corazn, o al menos mantngalo elevado en una banqueta mientras tenga hinchazn y Social research officer, government.  Evite todo rango de movimiento que no sea moderado [employer] [place] [employer] duela. No reinicie los movimientos hasta que se lo indique el profesional que lo asiste. Luego comience gradualmente, evitando llegar al punto en que le duela. Si el dolor aparece, disminuya la actividad y contine con las medidas indicadas ms arriba e incremente gradualmente las actividades que no le produzcan molestias hasta que progresivamente logre la actividad normal.  Utilice muletas si se las han indicado y por Physiological scientist prescrito.  Si lo dispone, utilice un hidromasaje.  Mantenga vendado el pie y el tobillo lesionado entre un tratamiento y [place].  Masajee el pie y el tobillo para Public house manager las molestias y disminuir la hinchazn. Masajee desde los dedos [employer] rodilla.  Utilice los medicamentos de venta libre o de prescripcin para el  dolor, el malestar o la fiebre, segn se lo indique el profesional que lo asiste. SOLICITE ATENCIN MDICA DE INMEDIATO SI:  El dolor o la inflamacin aumentan o el dolor es incontrolable, an con Conservation officer, nature.  Ha perdido la sensibilidad del pie o ste se enfra  o se vuelve de color azul.  Presenta sntomas nuevos o desconocidos o se incrementan los sntomas que lo llevaron a la consulta. EST SEGURO QUE:   Comprende las instrucciones para el alta mdica.  Controlar su enfermedad.  Solicitar atencin mdica de inmediato segn las indicaciones. Document Released: 06/20/2005 Document Revised: 09/12/2011 [facility] Patient Information 2015 [place]. This information is not intended to replace advice given to you by your health care provider. Make sure you discuss any questions you have with your health care provider.   Ice several times a day for 3 days, then heat several times a day Ace bandage as desired Elevate when sitting down Crutches if desired May have foot massage Come back if needed.

## 2014-11-03 ENCOUNTER — Other Ambulatory Visit: Payer: Self-pay

## 2014-11-03 DIAGNOSIS — E139 Other specified diabetes mellitus without complications: Secondary | ICD-10-CM

## 2014-11-03 MED ORDER — INSULIN NPH ISOPHANE & REGULAR (70-30) 100 UNIT/ML ~~LOC~~ SUSP: SUBCUTANEOUS | Status: DC

## 2014-12-17 ENCOUNTER — Encounter: Payer: Self-pay | Admitting: Internal Medicine

## 2014-12-17 ENCOUNTER — Ambulatory Visit: Payer: Self-pay | Attending: Internal Medicine | Admitting: Internal Medicine

## 2014-12-17 VITALS — BP 119/79 | HR 84 | Temp 98.1°F | Resp 16 | Wt 265.0 lb

## 2014-12-17 DIAGNOSIS — E1165 Type 2 diabetes mellitus with hyperglycemia: Secondary | ICD-10-CM | POA: Insufficient documentation

## 2014-12-17 DIAGNOSIS — E785 Hyperlipidemia, unspecified: Secondary | ICD-10-CM | POA: Insufficient documentation

## 2014-12-17 DIAGNOSIS — Z794 Long term (current) use of insulin: Secondary | ICD-10-CM | POA: Insufficient documentation

## 2014-12-17 DIAGNOSIS — E139 Other specified diabetes mellitus without complications: Secondary | ICD-10-CM

## 2014-12-17 DIAGNOSIS — F17210 Nicotine dependence, cigarettes, uncomplicated: Secondary | ICD-10-CM | POA: Insufficient documentation

## 2014-12-17 LAB — CBC WITH DIFFERENTIAL/PLATELET
BASOS PCT: 0 % (ref 0–1)
Basophils Absolute: 0 10*3/uL (ref 0.0–0.1)
EOS ABS: 0.2 10*3/uL (ref 0.0–0.7)
EOS PCT: 3 % (ref 0–5)
HCT: 33 % — ABNORMAL LOW (ref 36.0–46.0)
Hemoglobin: 9.9 g/dL — ABNORMAL LOW (ref 12.0–15.0)
Lymphocytes Relative: 47 % — ABNORMAL HIGH (ref 12–46)
Lymphs Abs: 3.9 10*3/uL (ref 0.7–4.0)
MCH: 20.6 pg — ABNORMAL LOW (ref 26.0–34.0)
MCHC: 30 g/dL (ref 30.0–36.0)
MCV: 68.8 fL — AB (ref 78.0–100.0)
MONO ABS: 0.5 10*3/uL (ref 0.1–1.0)
MONOS PCT: 6 % (ref 3–12)
MPV: 9.8 fL (ref 8.6–12.4)
Neutro Abs: 3.6 10*3/uL (ref 1.7–7.7)
Neutrophils Relative %: 44 % (ref 43–77)
Platelets: 418 10*3/uL — ABNORMAL HIGH (ref 150–400)
RBC: 4.8 MIL/uL (ref 3.87–5.11)
RDW: 16.6 % — AB (ref 11.5–15.5)
WBC: 8.2 10*3/uL (ref 4.0–10.5)

## 2014-12-17 LAB — GLUCOSE, POCT (MANUAL RESULT ENTRY): POC GLUCOSE: 188 mg/dL — AB (ref 70–99)

## 2014-12-17 LAB — POCT GLYCOSYLATED HEMOGLOBIN (HGB A1C): Hemoglobin A1C: 9.3

## 2014-12-17 MED ORDER — ATORVASTATIN CALCIUM 10 MG PO TABS: 10.0000 mg | ORAL_TABLET | Freq: Every day | ORAL | Status: DC

## 2014-12-17 MED ORDER — METFORMIN HCL 1000 MG PO TABS: 1000.0000 mg | ORAL_TABLET | Freq: Two times a day (BID) | ORAL | Status: DC

## 2014-12-17 MED ORDER — INSULIN NPH ISOPHANE & REGULAR (70-30) 100 UNIT/ML ~~LOC~~ SUSP: SUBCUTANEOUS | Status: DC

## 2014-12-17 MED ORDER — GABAPENTIN 300 MG PO CAPS: 300.0000 mg | ORAL_CAPSULE | Freq: Three times a day (TID) | ORAL | Status: DC

## 2014-12-17 MED ORDER — ALBUTEROL SULFATE HFA 108 (90 BASE) MCG/ACT IN AERS: 2.0000 | INHALATION_SPRAY | Freq: Four times a day (QID) | RESPIRATORY_TRACT | Status: DC | PRN

## 2014-12-17 NOTE — Patient Instructions (Signed)
La diabetes mellitus y los alimentos (Diabetes Mellitus and Food) Es importante que controle su nivel de azcar en la sangre (glucosa). El nivel de glucosa en sangre depende en gran medida de lo que usted come. Comer alimentos saludables en las cantidades [place] a lo largo del Training and development officer, aproximadamente a la misma hora [employer], lo ayudar a Chief Technology Officer su nivel de Multimedia programmer. Tambin puede ayudarlo a retrasar o Patent attorney de la diabetes mellitus. Comer de [employer] saludable incluso puede ayudarlo a Chartered loss adjuster de presin arterial y a Science writer o Theatre manager un peso saludable.  CMO PUEDEN AFECTARME LOS ALIMENTOS? Carbohidratos Los carbohidratos afectan el nivel de glucosa en sangre ms que cualquier otro tipo de alimento. El nutricionista lo ayudar a Teacher, adult education cuntos carbohidratos puede consumir en cada comida y ensearle a contarlos. El recuento de carbohidratos es importante para mantener la glucosa en sangre en un nivel saludable, en especial si utiliza insulina o toma determinados medicamentos para la diabetes mellitus. Alcohol El alcohol puede provocar disminuciones sbitas de la glucosa en sangre (hipoglucemia), en especial si utiliza insulina o toma determinados medicamentos para la diabetes mellitus. La hipoglucemia es una afeccin que puede poner en peligro la vida. Los sntomas de la hipoglucemia (somnolencia, mareos y Data processing manager) son similares a los sntomas de haber consumido mucho alcohol.  Si el mdico lo autoriza a beber alcohol, hgalo con moderacin y siga estas pautas:  Las mujeres no deben beber ms de un trago por da, y los hombres no deben beber ms de dos tragos por Training and development officer. Un trago es igual a:  12 onzas (355 ml) de cerveza  5 onzas de vino (150 ml) de vino  1,5onzas (35ml) de bebidas espirituosas  No beba con el estmago vaco.  Mantngase hidratado. Beba agua, gaseosas dietticas o t helado sin azcar.  Las gaseosas comunes, los jugos y  otros refrescos podran contener muchos carbohidratos y se Civil Service fast streamer. QU ALIMENTOS NO SE RECOMIENDAN? Cuando haga las elecciones de alimentos, es importante que recuerde que todos los alimentos son distintos. Algunos tienen menos nutrientes que otros por porcin, aunque podran tener la misma cantidad de caloras o carbohidratos. Es difcil darle al cuerpo lo que necesita cuando consume alimentos con menos nutrientes. Estos son algunos ejemplos de alimentos que debera evitar ya que contienen muchas caloras y carbohidratos, pero pocos nutrientes:  Physicist, medical trans (la mayora de los alimentos procesados incluyen grasas trans en la etiqueta de Informacin nutricional).  Gaseosas comunes.  Jugos.  Caramelos.  Dulces, como tortas, pasteles, rosquillas y [place].  Comidas fritas. QU ALIMENTOS PUEDO COMER? Consuma alimentos ricos en nutrientes, que nutrirn el cuerpo y lo mantendrn saludable. Los alimentos que debe comer tambin dependern de varios factores, como:  Las caloras que necesita.  Los medicamentos que toma.  Su peso.  El nivel de glucosa en [place].  El [place] de presin arterial.  El nivel de colesterol. Tambin debe consumir una variedad de [place], como:  Protenas, como carne, aves, pescado, tofu, frutos secos y semillas (las protenas de [place] magros son mejores).  Lambert Mody.  Verduras.  Productos lcteos, como [place], queso y yogur (descremados son mejores).  Panes, granos, pastas, cereales, arroz y frijoles.  Grasas, como aceite de [place], [place] sin grasas trans, aceite de canola, aguacate y [place]. TODOS LOS QUE PADECEN DIABETES MELLITUS TIENEN EL [place] PLAN DE [place]? Dado que todas las personas que padecen diabetes mellitus son distintas, no hay un solo plan de comidas que funcione para todos. Es muy  importante que se rena con un nutricionista que lo ayudar a crear un plan de comidas adecuado para usted. Document Released: 09/27/2007  Document Revised: 06/25/2013 ExitCare Patient Information 2015 [employer]. This information is not intended to replace advice given to you by your health care provider. Make sure you discuss any questions you have with your health care provider.  

## 2014-12-17 NOTE — Progress Notes (Signed)
Patient here for follow up on her diabetes Patient will be going out of town for a couple of months and will Need her prescriptions to reflect that

## 2014-12-17 NOTE — Progress Notes (Signed)
MRN: [id number] Name: Julie Robertson  Sex: female Age: 39 y.o. DOB: [birth year]  Allergies: Review of patient's allergies indicates no known allergies.  Chief Complaint  Patient presents with  . Follow-up    HPI: Patient is 39 y.o. female who has is she of diabetes, hyperlipidemia comes today for followup as per patient she is taking insulin 17 units 2 times a day, she takes metformin only one time a day, she denies any hypoglycemic symptoms sometimes she checks her blood sugar level and most of time her blood sugar is elevated, currently she denies any headache dizziness chest and shortness of breath, she is requesting refill on her medications.  Past Medical History  Diagnosis Date  . Diabetes mellitus without complication     Past Surgical History  Procedure Laterality Date  . Tubal ligation        Medication List       This list is accurate as of: 12/17/14  5:50 PM.  Always use your most recent med list.               albuterol 108 (90 BASE) MCG/ACT inhaler  Commonly known as:  PROVENTIL HFA;VENTOLIN HFA  Inhale 2 puffs into the lungs every 6 (six) hours as needed for wheezing or shortness of breath.     atorvastatin 10 MG tablet  Commonly known as:  LIPITOR  Take 1 tablet (10 mg total) by mouth daily.     azithromycin 250 MG tablet  Commonly known as:  ZITHROMAX Z-PAK  Bronchitis     gabapentin 300 MG capsule  Commonly known as:  NEURONTIN  Take 1 capsule (300 mg total) by mouth 3 (three) times daily.     glucose monitoring kit monitoring kit  1 each by Does not apply route 4 (four) times daily - after meals and at bedtime. 1 month Diabetic Testing Supplies for QAC-QHS accuchecks.     guaiFENesin-codeine 100-10 MG/5ML syrup  Take 5 mLs by mouth every 4 (four) hours as needed for cough.     insulin NPH-regular Human (70-30) 100 UNIT/ML injection  Commonly known as:  HUMULIN 70/30  INJECT 17 UNITS UNDER THE SKIN 2 TIMES DAILY     Insulin  Pen Needle 31G X 5 MM Misc  Inject 10 units subcutaneous at bedtime     magnesium hydroxide 400 MG/5ML suspension  Commonly known as:  MILK OF MAGNESIA  Take 5 mLs by mouth daily as needed for mild constipation.     metFORMIN 1000 MG tablet  Commonly known as:  GLUCOPHAGE  Take 1 tablet (1,000 mg total) by mouth 2 (two) times daily with a meal.     nicotine 14 mg/24hr patch  Commonly known as:  NICODERM CQ - dosed in mg/24 hours  Place 1 patch (14 mg total) onto the skin daily.        Meds ordered this encounter  Medications  . atorvastatin (LIPITOR) 10 MG tablet    Sig: Take 1 tablet (10 mg total) by mouth daily.    Dispense:  90 tablet    Refill:  3  . gabapentin (NEURONTIN) 300 MG capsule    Sig: Take 1 capsule (300 mg total) by mouth 3 (three) times daily.    Dispense:  270 capsule    Refill:  3  . insulin NPH-regular Human (HUMULIN 70/30) (70-30) 100 UNIT/ML injection    Sig: INJECT 17 UNITS UNDER THE SKIN 2 TIMES DAILY    Dispense:  30 mL  Refill:  2  . metFORMIN (GLUCOPHAGE) 1000 MG tablet    Sig: Take 1 tablet (1,000 mg total) by mouth 2 (two) times daily with a meal.    Dispense:  180 tablet    Refill:  3  . albuterol (PROVENTIL HFA;VENTOLIN HFA) 108 (90 BASE) MCG/ACT inhaler    Sig: Inhale 2 puffs into the lungs every 6 (six) hours as needed for wheezing or shortness of breath.    Dispense:  1 Inhaler    Refill:  2    Immunization History  Administered Date(s) Administered  . Influenza,inj,Quad PF,36+ Mos 09/15/2014  . Pneumococcal Polysaccharide-23 09/15/2014    Family History  Problem Relation Age of Onset  . Diabetes Mother   . Heart disease Mother   . Cancer Mother   . Cancer Father   . Diabetes Father   . Heart disease Father     History  Substance Use Topics  . Smoking status: Current Every Day Smoker -- 1.50 packs/day for 4 years    Types: Cigarettes  . Smokeless tobacco: Not on file  . Alcohol Use: Yes     Comment: socially     Review of Systems   As noted in HPI  Filed Vitals:   12/17/14 1727  BP: 119/79  Pulse: 84  Temp: 98.1 F (36.7 C)  Resp: 16    Physical Exam  Physical Exam  Constitutional: No distress.  Eyes: EOM are normal. Pupils are equal, round, and reactive to light.  Cardiovascular: Normal rate.   Pulmonary/Chest: Breath sounds normal. No respiratory distress. She has no wheezes. She has no rales.  Musculoskeletal: She exhibits no edema.    CBC    Component Value Date/Time   WBC 7.6 02/13/2013 1701   RBC 5.05 02/13/2013 1701   HGB 12.2 02/13/2013 1701   HCT 39.1 02/13/2013 1701   PLT 351 02/13/2013 1701   MCV 77.4* 02/13/2013 1701   LYMPHSABS 3.1 02/13/2013 1701   MONOABS 0.4 02/13/2013 1701   EOSABS 0.2 02/13/2013 1701   BASOSABS 0.0 02/13/2013 1701    CMP     Component Value Date/Time   NA 137 12/05/2013 0941   K 4.8 12/05/2013 0941   CL 106 12/05/2013 0941   CO2 24 12/05/2013 0941   GLUCOSE 99 12/05/2013 0941   BUN 13 12/05/2013 0941   CREATININE 0.69 12/05/2013 0941   CREATININE 0.84 05/14/2010 2135   CALCIUM 8.9 12/05/2013 0941   PROT 7.0 12/05/2013 0941   ALBUMIN 3.9 12/05/2013 0941   AST 36 12/05/2013 0941   ALT 36* 12/05/2013 0941   ALKPHOS 57 12/05/2013 0941   BILITOT 0.3 12/05/2013 0941   GFRNONAA >89 12/05/2013 0941   GFRAA >89 12/05/2013 0941    Lab Results  Component Value Date/Time   CHOL 142 12/05/2013 09:41 AM    Lab Results  Component Value Date/Time   HGBA1C 9.30 12/17/2014 05:20 PM   HGBA1C * 08/22/2008 03:33 PM    11.6 (NOTE)   The ADA recommends the following therapeutic goal for glycemic   control related to Hgb A1C measurement:   Goal of Therapy:   < 7.0% Hgb A1C   Reference: American Diabetes Association: Clinical Practice   Recommendations 2008, Diabetes Care,  2008, 31:(Suppl 1).    Lab Results  Component Value Date/Time   AST 36 12/05/2013 09:41 AM    Assessment and Plan  Other specified diabetes mellitus without  complications - Plan:  Results for orders placed or performed in visit on  12/17/14  Glucose (CBG)  Result Value Ref Range   POC Glucose 188.0 (A) 70 - 99 mg/dl  HgB A1c  Result Value Ref Range   Hemoglobin A1C 9.30    Hemoglobin A1c has trended down but is still uncontrolled diabetes, have advised patient with compliance in taking metformin 2 times a day she will also increase the dose of insulin to 18 units 2 times a day, keep the fingerstick log repeat A1c in 3 months, ordered blood work today\ HgB A1c, gabapentin (NEURONTIN) 300 MG capsule, insulin NPH-regular Human (HUMULIN 70/30) (70-30) 100 UNIT/ML injection, metFORMIN (GLUCOPHAGE) 1000 MG tablet, COMPLETE METABOLIC PANEL WITH GFR, CBC with Differential/Platelet  Hyperlipidemia - Plan: patient is currently on atorvastatin (LIPITOR) 10 MG tablet, we'll check fasting lipid panel on the next visit   Return in about 3 months (around 03/19/2015), or if symptoms worsen or fail to improve.   This note has been created with Surveyor, quantity. Any transcriptional errors are unintentional.    Lorayne Marek, MD

## 2014-12-18 ENCOUNTER — Telehealth: Payer: Self-pay

## 2014-12-18 LAB — COMPLETE METABOLIC PANEL WITH GFR
ALK PHOS: 58 U/L (ref 39–117)
ALT: 39 U/L — AB (ref 0–35)
AST: 38 U/L — AB (ref 0–37)
Albumin: 3.8 g/dL (ref 3.5–5.2)
BILIRUBIN TOTAL: 0.3 mg/dL (ref 0.2–1.2)
BUN: 14 mg/dL (ref 6–23)
CHLORIDE: 106 meq/L (ref 96–112)
CO2: 24 mEq/L (ref 19–32)
CREATININE: 0.84 mg/dL (ref 0.50–1.10)
Calcium: 8.4 mg/dL (ref 8.4–10.5)
GFR, EST NON AFRICAN AMERICAN: 87 mL/min
GFR, Est African American: >89 mL/min
Glucose, Bld: 125 mg/dL — ABNORMAL HIGH (ref 70–99)
Potassium: 4.4 mEq/L (ref 3.5–5.3)
Sodium: 138 mEq/L (ref 135–145)
Total Protein: 6.9 g/dL (ref 6.0–8.3)

## 2014-12-18 NOTE — Telephone Encounter (Signed)
Pt returning call for nurse. Please f/u with pt.

## 2014-12-18 NOTE — Telephone Encounter (Signed)
Interpreter line used Left message on voice mail to return our call

## 2014-12-18 NOTE — Telephone Encounter (Signed)
-----   Message from Lorayne Marek, MD sent at 12/18/2014  9:27 AM EDT ----- Blood work reviewed her CBC shows low hemoglobin level with low MCV with elevated RDW, patient likely has microcytic anemia secondary to iron deficiency, advised patient to start taking over-the-counter iron supplement 3 times a day, will repeat CBC on the following visit. Also noted borderline elevated liver enzymes, advise patient to avoid alcohol intake, will repeat her blood chemistry on the following visit.

## 2014-12-25 ENCOUNTER — Telehealth: Payer: Self-pay

## 2014-12-25 NOTE — Telephone Encounter (Signed)
Interpreter used-Julie Robertson Returned patient phone call Patient not available Left message on voice mail to return our call

## 2014-12-29 ENCOUNTER — Other Ambulatory Visit: Payer: Self-pay | Admitting: Internal Medicine

## 2015-03-06 ENCOUNTER — Encounter (HOSPITAL_COMMUNITY): Payer: Self-pay | Admitting: Emergency Medicine

## 2015-03-06 ENCOUNTER — Emergency Department (INDEPENDENT_AMBULATORY_CARE_PROVIDER_SITE_OTHER)
Admission: EM | Admit: 2015-03-06 | Discharge: 2015-03-06 | Disposition: A | Discharge status: Home / Self Care | Payer: Self-pay | Source: Home / Self Care | Attending: Emergency Medicine | Admitting: Emergency Medicine

## 2015-03-06 DIAGNOSIS — L600 Ingrowing nail: Secondary | ICD-10-CM

## 2015-03-06 MED ORDER — CEPHALEXIN 500 MG PO CAPS
500.0000 mg | ORAL_CAPSULE | Freq: Four times a day (QID) | ORAL | Status: DC
Start: 2015-03-06 — End: 2015-03-16

## 2015-03-06 NOTE — Discharge Instructions (Signed)
Uña del pie encarnada °(Ingrown Toenail) °El profesional que lo asiste le ha diagnosticado que usted tiene la uña del pie encarnada. Esto se produce cuando un borde afilado de la uña crece dentro de la piel. Entre las causas, se incluyen cortarse las uñas muy atrás, o usar zapatos que no calcen bien. Actividades que impliquen detenerse rápidamente (básquet, tenis) causan traumatismos en los dedos que ocasionan la uña encarnada. °INSTRUCCIONES PARA EL CUIDADO DOMICILIARIO °· Remoje todo el pie en agua tibia jabonosa durante 20 minutos, tres veces por día. °· Puede separar el borde de la uña de la piel que duele, colocando un pequeño trozo de algodón bajo la esquina de la uña. Tenga cuidado de no hundirla (traumatizar) y ocasionar una lesión mayor. °· Use zapatos que calcen bien. Mientras tenga este problema, puede ser útil usar sandalias. °· Corte las uñas regularmente y con cuidado. Corte las uñas en línea recta y no en curva. Esto evitará que la piel lesione las esquinas de las uñas. °· Mantenga los pies limpios y secos. °· Puede serle útil usar muletas a comienzo del tratamiento siente dolor al caminar. °· Si le prescriben antibióticos, tómelos tal como se le indicó. °· Regrese para controlar la herida dentro de dos días, o según le hayan indicado. °· Utilice los medicamentos de venta libre o de prescripción para el dolor, el malestar o la fiebre, según se lo indique el profesional que lo asiste. °SOLICITE ATENCIÓN MÉDICA DE INMEDIATO SI: °· Tiene fiebre. °· Aumenta el dolor, el enrojecimiento, la hinchazón o el calor en el lugar de la herida. °· El dedo no se cura en el término de 7 días. °Si el tratamiento conservador no tiene éxito, será necesario que se someta a la extirpación quirúrgica de una porción o de toda la uña. °ESTÉ SEGURO QUE:  °· Comprende las instrucciones para el alta médica. °· Controlará su enfermedad. °· Solicitará atención médica de inmediato según las indicaciones. °Document Released:  06/20/2005 Document Revised: 09/12/2011 °ExitCare® Patient Information ©2015 [employer]. This information is not intended to replace advice given to you by your health care provider. Make sure you discuss any questions you have with your health care provider. ° °

## 2015-03-06 NOTE — ED Notes (Signed)
Ingrown toenail for one week

## 2015-03-06 NOTE — ED Provider Notes (Signed)
CSN: [id number]     Arrival date & time 03/06/15  1418 History   First MD Initiated Contact with Patient 03/06/15 1541     Chief Complaint  Patient presents with  . Ingrown Toenail   (Consider location/radiation/quality/duration/timing/severity/associated sxs/prior Treatment) HPI Pt is a 39 y.o. female presenting with right great toenail pain. PMH significant for uncontrolled diabetes (a1c 8-9). Started getting pain in right great toe several weeks ago. Drained some pus but drainage has not continued. She tried to cut away the toenail but she was unable to get the corner ingrown part. She is concerned about infection and losing her toenail since her father had an ingrown toenail that got infected and eventually required amputation of the toe.  Past Medical History  Diagnosis Date  . Diabetes mellitus without complication    Past Surgical History  Procedure Laterality Date  . Tubal ligation     Family History  Problem Relation Age of Onset  . Diabetes Mother   . Heart disease Mother   . Cancer Mother   . Cancer Father   . Diabetes Father   . Heart disease Father    Social History  Substance Use Topics  . Smoking status: Current Every Day Smoker -- 1.50 packs/day for 4 years    Types: Cigarettes  . Smokeless tobacco: None  . Alcohol Use: Yes     Comment: socially   OB History    No data available     Review of Systems No fevers, no chills.  Allergies  Review of patient's allergies indicates no known allergies.  Home Medications   Prior to Admission medications   Medication Sig Start Date End Date Taking? Authorizing Provider  albuterol (PROVENTIL HFA;VENTOLIN HFA) 108 (90 BASE) MCG/ACT inhaler Inhale 2 puffs into the lungs every 6 (six) hours as needed for wheezing or shortness of breath. 12/17/14   Lorayne Marek, MD  atorvastatin (LIPITOR) 10 MG tablet Take 1 tablet (10 mg total) by mouth daily. 12/17/14   Lorayne Marek, MD  azithromycin (ZITHROMAX Z-PAK) 250 MG tablet  Bronchitis Patient not taking: Reported on 10/17/2014 10/02/14   Brayton Caves, PA-C  gabapentin (NEURONTIN) 300 MG capsule Take 1 capsule (300 mg total) by mouth 3 (three) times daily. 12/17/14   Lorayne Marek, MD  glucose monitoring kit (FREESTYLE) monitoring kit 1 each by Does not apply route 4 (four) times daily - after meals and at bedtime. 1 month Diabetic Testing Supplies for QAC-QHS accuchecks. 07/03/13   Lorayne Marek, MD  guaiFENesin-codeine 100-10 MG/5ML syrup Take 5 mLs by mouth every 4 (four) hours as needed for cough. Patient not taking: Reported on 10/17/2014 10/02/14   Brayton Caves, PA-C  insulin NPH-regular Human (HUMULIN 70/30) (70-30) 100 UNIT/ML injection INJECT 17 UNITS UNDER THE SKIN 2 TIMES DAILY 12/17/14   Lorayne Marek, MD  Insulin Pen Needle 31G X 5 MM MISC Inject 10 units subcutaneous at bedtime 07/03/13   Lorayne Marek, MD  magnesium hydroxide (MILK OF MAGNESIA) 400 MG/5ML suspension Take 5 mLs by mouth daily as needed for mild constipation. Patient not taking: Reported on 10/02/2014 07/03/13   Lorayne Marek, MD  metFORMIN (GLUCOPHAGE) 1000 MG tablet Take 1 tablet (1,000 mg total) by mouth 2 (two) times daily with a meal. 12/17/14   Lorayne Marek, MD  nicotine (NICODERM CQ - DOSED IN MG/24 HOURS) 14 mg/24hr patch Place 1 patch (14 mg total) onto the skin daily. Patient not taking: Reported on 10/02/2014 07/03/13   Lorayne Marek, MD  Meds Ordered and Administered this Visit  Medications - No data to display  BP 149/73 mmHg  Pulse 80  Temp(Src) 97.9 F (36.6 C) (Oral)  Resp 16  SpO2 100%  LMP 02/28/2015 No data found.   Physical Exam General: NAD MSK:  Right great toenail thickened with onychomycosis. Medial side of toenail partially removed. Ingrown nail at medial corner. Tender to palpation. No fluctuance or pus noted. No erythema of surrounding skin.  ED Course  Procedures (including critical care time)  Labs Review Labs Reviewed - No data to  display  Imaging Review No results found.   MDM   1. Ingrown right big toenail    Unable to remove in office today as out of suitable scissors for cutting nail. Recommend referral to podiatry for partial toenail removal, warm salt baths, keflex x 5 days.  Patient was seen and examined with Janne Napoleon, NP.  Medical screening examination/treatment/procedure(s) were performed by resident physician or non-physician practitioner and as supervising physician I was immediately available for consultation/collaboration.  Maryruth Eve, MD     Melony Overly, MD 03/07/15 [phone]

## 2015-03-09 ENCOUNTER — Inpatient Hospital Stay (HOSPITAL_COMMUNITY)
Admission: EM | Admit: 2015-03-09 | Discharge: 2015-03-16 | DRG: 418 | Disposition: A | Discharge status: Home / Self Care | Payer: Medicaid Other | Attending: General Surgery | Admitting: General Surgery

## 2015-03-09 ENCOUNTER — Emergency Department (HOSPITAL_COMMUNITY): Payer: Medicaid Other

## 2015-03-09 ENCOUNTER — Encounter (HOSPITAL_COMMUNITY): Payer: Self-pay | Admitting: Vascular Surgery

## 2015-03-09 DIAGNOSIS — K5900 Constipation, unspecified: Secondary | ICD-10-CM | POA: Diagnosis present

## 2015-03-09 DIAGNOSIS — G629 Polyneuropathy, unspecified: Secondary | ICD-10-CM | POA: Diagnosis present

## 2015-03-09 DIAGNOSIS — J811 Chronic pulmonary edema: Secondary | ICD-10-CM | POA: Diagnosis not present

## 2015-03-09 DIAGNOSIS — D490 Neoplasm of unspecified behavior of digestive system: Secondary | ICD-10-CM | POA: Diagnosis present

## 2015-03-09 DIAGNOSIS — F17210 Nicotine dependence, cigarettes, uncomplicated: Secondary | ICD-10-CM | POA: Diagnosis present

## 2015-03-09 DIAGNOSIS — J45909 Unspecified asthma, uncomplicated: Secondary | ICD-10-CM | POA: Diagnosis present

## 2015-03-09 DIAGNOSIS — Z419 Encounter for procedure for purposes other than remedying health state, unspecified: Secondary | ICD-10-CM

## 2015-03-09 DIAGNOSIS — R0602 Shortness of breath: Secondary | ICD-10-CM

## 2015-03-09 DIAGNOSIS — K831 Obstruction of bile duct: Secondary | ICD-10-CM

## 2015-03-09 DIAGNOSIS — R1011 Right upper quadrant pain: Secondary | ICD-10-CM

## 2015-03-09 DIAGNOSIS — Z6841 Body Mass Index (BMI) 40.0 and over, adult: Secondary | ICD-10-CM

## 2015-03-09 DIAGNOSIS — R0689 Other abnormalities of breathing: Secondary | ICD-10-CM

## 2015-03-09 DIAGNOSIS — K810 Acute cholecystitis: Secondary | ICD-10-CM | POA: Diagnosis present

## 2015-03-09 DIAGNOSIS — Z794 Long term (current) use of insulin: Secondary | ICD-10-CM

## 2015-03-09 DIAGNOSIS — D649 Anemia, unspecified: Secondary | ICD-10-CM | POA: Diagnosis present

## 2015-03-09 DIAGNOSIS — K838 Other specified diseases of biliary tract: Secondary | ICD-10-CM

## 2015-03-09 DIAGNOSIS — K8062 Calculus of gallbladder and bile duct with acute cholecystitis without obstruction: Principal | ICD-10-CM | POA: Diagnosis present

## 2015-03-09 DIAGNOSIS — Z79899 Other long term (current) drug therapy: Secondary | ICD-10-CM

## 2015-03-09 DIAGNOSIS — C249 Malignant neoplasm of biliary tract, unspecified: Secondary | ICD-10-CM

## 2015-03-09 DIAGNOSIS — E785 Hyperlipidemia, unspecified: Secondary | ICD-10-CM | POA: Diagnosis present

## 2015-03-09 DIAGNOSIS — K8050 Calculus of bile duct without cholangitis or cholecystitis without obstruction: Secondary | ICD-10-CM

## 2015-03-09 DIAGNOSIS — J9811 Atelectasis: Secondary | ICD-10-CM | POA: Diagnosis not present

## 2015-03-09 DIAGNOSIS — E1140 Type 2 diabetes mellitus with diabetic neuropathy, unspecified: Secondary | ICD-10-CM | POA: Diagnosis present

## 2015-03-09 HISTORY — DX: Obesity, unspecified: E66.9

## 2015-03-09 HISTORY — DX: Hyperlipidemia, unspecified: E78.5

## 2015-03-09 HISTORY — DX: Type 2 diabetes mellitus with diabetic neuropathy, unspecified: E11.40

## 2015-03-09 HISTORY — DX: Calculus of gallbladder without cholecystitis without obstruction: K80.20

## 2015-03-09 LAB — CBC
HEMATOCRIT: 36.1 % (ref 36.0–46.0)
HEMOGLOBIN: 10.4 g/dL — AB (ref 12.0–15.0)
MCH: 20.2 pg — AB (ref 26.0–34.0)
MCHC: 28.8 g/dL — AB (ref 30.0–36.0)
MCV: 70.1 fL — AB (ref 78.0–100.0)
Platelets: 376 10*3/uL (ref 150–400)
RBC: 5.15 MIL/uL — ABNORMAL HIGH (ref 3.87–5.11)
RDW: 16.6 % — AB (ref 11.5–15.5)
WBC: 8.3 10*3/uL (ref 4.0–10.5)

## 2015-03-09 LAB — COMPREHENSIVE METABOLIC PANEL
ALBUMIN: 3.2 g/dL — AB (ref 3.5–5.0)
ALK PHOS: 223 U/L — AB (ref 38–126)
ALT: 135 U/L — ABNORMAL HIGH (ref 14–54)
AST: 57 U/L — AB (ref 15–41)
Anion gap: 9 (ref 5–15)
BILIRUBIN TOTAL: 3 mg/dL — AB (ref 0.3–1.2)
BUN: 9 mg/dL (ref 6–20)
CO2: 21 mmol/L — AB (ref 22–32)
Calcium: 9 mg/dL (ref 8.9–10.3)
Chloride: 105 mmol/L (ref 101–111)
Creatinine, Ser: 0.77 mg/dL (ref 0.44–1.00)
GFR calc Af Amer: >60 mL/min (ref >60)
GFR calc non Af Amer: >60 mL/min (ref >60)
GLUCOSE: 263 mg/dL — AB (ref 65–99)
POTASSIUM: 4 mmol/L (ref 3.5–5.1)
SODIUM: 135 mmol/L (ref 135–145)
TOTAL PROTEIN: 7 g/dL (ref 6.5–8.1)

## 2015-03-09 LAB — URINALYSIS, ROUTINE W REFLEX MICROSCOPIC
HGB URINE DIPSTICK: NEGATIVE
Ketones, ur: 40 mg/dL — AB
Leukocytes, UA: NEGATIVE
Nitrite: NEGATIVE
PH: 5.5 (ref 5.0–8.0)
Protein, ur: 30 mg/dL — AB
SPECIFIC GRAVITY, URINE: 1.038 — AB (ref 1.005–1.030)
UROBILINOGEN UA: 1 mg/dL (ref 0.0–1.0)

## 2015-03-09 LAB — PREGNANCY, URINE: Preg Test, Ur: NEGATIVE

## 2015-03-09 LAB — URINE MICROSCOPIC-ADD ON

## 2015-03-09 LAB — GLUCOSE, CAPILLARY: GLUCOSE-CAPILLARY: 148 mg/dL — AB (ref 65–99)

## 2015-03-09 LAB — LIPASE, BLOOD: Lipase: 18 U/L — ABNORMAL LOW (ref 22–51)

## 2015-03-09 MED ORDER — MORPHINE SULFATE (PF) 4 MG/ML IV SOLN
4.0000 mg | INTRAVENOUS | Status: AC | PRN
  Administered 2015-03-09 (×3): 4 mg via INTRAVENOUS
  Filled 2015-03-09 (×3): qty 1

## 2015-03-09 MED ORDER — ONDANSETRON 4 MG PO TBDP: 4.0000 mg | ORAL_TABLET | Freq: Four times a day (QID) | ORAL | Status: DC | PRN

## 2015-03-09 MED ORDER — SODIUM CHLORIDE 0.9 % IV SOLN: Freq: Once | INTRAVENOUS | Status: DC

## 2015-03-09 MED ORDER — DIPHENHYDRAMINE HCL 50 MG/ML IJ SOLN: 25.0000 mg | Freq: Four times a day (QID) | INTRAMUSCULAR | Status: DC | PRN

## 2015-03-09 MED ORDER — DEXTROSE 5 % IV SOLN
2.0000 g | INTRAVENOUS | Status: DC
  Administered 2015-03-09 – 2015-03-15 (×8): 2 g via INTRAVENOUS
  Filled 2015-03-09 (×8): qty 2

## 2015-03-09 MED ORDER — DIPHENHYDRAMINE HCL 25 MG PO CAPS: 25.0000 mg | ORAL_CAPSULE | Freq: Four times a day (QID) | ORAL | Status: DC | PRN

## 2015-03-09 MED ORDER — HYDROMORPHONE HCL 1 MG/ML IJ SOLN
1.0000 mg | INTRAMUSCULAR | Status: DC | PRN
  Administered 2015-03-10: 1 mg via INTRAVENOUS
  Filled 2015-03-09: qty 1

## 2015-03-09 MED ORDER — ONDANSETRON HCL 4 MG/2ML IJ SOLN
4.0000 mg | Freq: Four times a day (QID) | INTRAMUSCULAR | Status: DC | PRN
  Administered 2015-03-11 – 2015-03-12 (×2): 4 mg via INTRAVENOUS
  Filled 2015-03-09: qty 2

## 2015-03-09 MED ORDER — HYDRALAZINE HCL 20 MG/ML IJ SOLN: 10.0000 mg | Freq: Four times a day (QID) | INTRAMUSCULAR | Status: DC | PRN

## 2015-03-09 MED ORDER — SODIUM CHLORIDE 0.9 % IV SOLN
INTRAVENOUS | Status: DC
  Administered 2015-03-09 – 2015-03-13 (×6): via INTRAVENOUS

## 2015-03-09 MED ORDER — SODIUM CHLORIDE 0.9 % IV BOLUS (SEPSIS)
500.0000 mL | Freq: Once | INTRAVENOUS | Status: AC
  Administered 2015-03-09: 500 mL via INTRAVENOUS

## 2015-03-09 MED ORDER — INSULIN ASPART 100 UNIT/ML ~~LOC~~ SOLN
0.0000 [IU] | Freq: Three times a day (TID) | SUBCUTANEOUS | Status: DC
  Administered 2015-03-10: 7 [IU] via SUBCUTANEOUS
  Administered 2015-03-11: 4 [IU] via SUBCUTANEOUS
  Administered 2015-03-11 (×2): 7 [IU] via SUBCUTANEOUS
  Administered 2015-03-12 (×2): 11 [IU] via SUBCUTANEOUS
  Administered 2015-03-12: 4 [IU] via SUBCUTANEOUS
  Administered 2015-03-13 (×2): 7 [IU] via SUBCUTANEOUS
  Administered 2015-03-13: 11 [IU] via SUBCUTANEOUS
  Administered 2015-03-14 (×2): 4 [IU] via SUBCUTANEOUS
  Administered 2015-03-14: 7 [IU] via SUBCUTANEOUS
  Administered 2015-03-15 (×2): 4 [IU] via SUBCUTANEOUS
  Administered 2015-03-15: 11 [IU] via SUBCUTANEOUS

## 2015-03-09 MED ORDER — ONDANSETRON HCL 4 MG/2ML IJ SOLN
4.0000 mg | Freq: Once | INTRAMUSCULAR | Status: AC
  Administered 2015-03-09: 4 mg via INTRAVENOUS
  Filled 2015-03-09: qty 2

## 2015-03-09 NOTE — ED Notes (Signed)
Patient transported to Ultrasound 

## 2015-03-09 NOTE — H&P (Signed)
Julie Robertson is an 39 y.o. female.   Chief Complaint: Abdominal pain HPI: Patient is a 39 year old female with a 2 day history of abdominal pain. She states this is mainly in the right upper quadrant. The been constant over the last 2 days. This is associated with nausea and vomiting. She's had poor appetite during his last 2 days. Patient also states she has had pale-colored stools as well as darker urine.  Patient underwent ultrasound in the ER which revealed multiple gallstones and a dilated common bile duct to 12 mm. Patient also had elevated transaminases elevated bilirubin at 3.0.  Past Medical History  Diagnosis Date  . Diabetes mellitus without complication     Past Surgical History  Procedure Laterality Date  . Tubal ligation      Family History  Problem Relation Age of Onset  . Diabetes Mother   . Heart disease Mother   . Cancer Mother   . Cancer Father   . Diabetes Father   . Heart disease Father    Social History:  reports that she has been smoking Cigarettes.  She has a 6 pack-year smoking history. She does not have any smokeless tobacco history on file. She reports that she drinks alcohol. She reports that she does not use illicit drugs.  Allergies: No Known Allergies   (Not in a hospital admission)  Results for orders placed or performed during the hospital encounter of 03/09/15 (from the past 48 hour(s))  Lipase, blood     Status: Abnormal   Collection Time: 03/09/15  1:56 PM  Result Value Ref Range   Lipase 18 (L) 22 - 51 U/L  Comprehensive metabolic panel     Status: Abnormal   Collection Time: 03/09/15  1:56 PM  Result Value Ref Range   Sodium 135 135 - 145 mmol/L   Potassium 4.0 3.5 - 5.1 mmol/L   Chloride 105 101 - 111 mmol/L   CO2 21 (L) 22 - 32 mmol/L   Glucose, Bld 263 (H) 65 - 99 mg/dL   BUN 9 6 - 20 mg/dL   Creatinine, Ser 0.77 0.44 - 1.00 mg/dL   Calcium 9.0 8.9 - 10.3 mg/dL   Total Protein 7.0 6.5 - 8.1 g/dL   Albumin 3.2 (L)  3.5 - 5.0 g/dL   AST 57 (H) 15 - 41 U/L   ALT 135 (H) 14 - 54 U/L   Alkaline Phosphatase 223 (H) 38 - 126 U/L   Total Bilirubin 3.0 (H) 0.3 - 1.2 mg/dL   GFR calc non Af Amer >60 >60 mL/min   GFR calc Af Amer >60 >60 mL/min    Comment: (NOTE) The eGFR has been calculated using the CKD EPI equation. This calculation has not been validated in all clinical situations. eGFR's persistently <60 mL/min signify possible Chronic Kidney Disease.    Anion gap 9 5 - 15  CBC     Status: Abnormal   Collection Time: 03/09/15  1:56 PM  Result Value Ref Range   WBC 8.3 4.0 - 10.5 K/uL   RBC 5.15 (H) 3.87 - 5.11 MIL/uL   Hemoglobin 10.4 (L) 12.0 - 15.0 g/dL   HCT 36.1 36.0 - 46.0 %   MCV 70.1 (L) 78.0 - 100.0 fL   MCH 20.2 (L) 26.0 - 34.0 pg   MCHC 28.8 (L) 30.0 - 36.0 g/dL   RDW 16.6 (H) 11.5 - 15.5 %   Platelets 376 150 - 400 K/uL  Urinalysis, Routine w reflex microscopic (not at  [place])     Status: Abnormal   Collection Time: 03/09/15  4:41 PM  Result Value Ref Range   Color, Urine AMBER (A) YELLOW    Comment: BIOCHEMICALS MAY BE AFFECTED BY COLOR   APPearance CLEAR CLEAR   Specific Gravity, Urine 1.038 (H) 1.005 - 1.030   pH 5.5 5.0 - 8.0   Glucose, UA >1000 (A) NEGATIVE mg/dL   Hgb urine dipstick NEGATIVE NEGATIVE   Bilirubin Urine MODERATE (A) NEGATIVE   Ketones, ur 40 (A) NEGATIVE mg/dL   Protein, ur 30 (A) NEGATIVE mg/dL   Urobilinogen, UA 1.0 0.0 - 1.0 mg/dL   Nitrite NEGATIVE NEGATIVE   Leukocytes, UA NEGATIVE NEGATIVE  Pregnancy, urine     Status: None   Collection Time: 03/09/15  4:41 PM  Result Value Ref Range   Preg Test, Ur NEGATIVE NEGATIVE    Comment:        THE SENSITIVITY OF THIS METHODOLOGY IS >20 mIU/mL.   Urine microscopic-add on     Status: Abnormal   Collection Time: 03/09/15  4:41 PM  Result Value Ref Range   Squamous Epithelial / LPF MANY (A) RARE   WBC, UA 3-6 <3 WBC/hpf   RBC / HPF 0-2 <3 RBC/hpf   Bacteria, UA FEW (A) RARE   US Abdomen Limited  Ruq  03/09/2015   CLINICAL DATA:  Right upper quadrant pain. Nausea and vomiting for 3 days.  EXAM: US ABDOMEN LIMITED - RIGHT UPPER QUADRANT  COMPARISON:  None.  FINDINGS: Gallbladder:  Cholelithiasis without pericholecystic fluid or gallbladder wall thickening. Negative sonographic Murphy sign.  Common bile duct:  Diameter: 12 mm  Liver:  No focal lesion identified. Within normal limits in parenchymal echogenicity.  IMPRESSION: 1. Cholelithiasis without sonographic evidence of acute cholecystitis. Dilated common bile duct measuring 12 mm without an obstructing lesion or choledocholithiasis. If there is continue clinical concern, further evaluation with MRCP is recommended.   Electronically Signed   By: Kathreen Devoid   On: 03/09/2015 19:07    Review of Systems  Constitutional: Negative.   HENT: Negative.   Respiratory: Negative.   Cardiovascular: Negative.   Gastrointestinal: Positive for nausea, vomiting, abdominal pain and constipation.       Pale-colored stools, dark urine.  Musculoskeletal: Negative.   Skin: Negative.   Neurological: Negative.     Blood pressure 121/69, pulse 79, temperature 98.4 F (36.9 C), temperature source Oral, resp. rate 14, last menstrual period 02/28/2015, SpO2 98 %. Physical Exam  Constitutional: She is oriented to person, place, and time. She appears well-developed and well-nourished.  HENT:  Head: Normocephalic and atraumatic.  Eyes: Conjunctivae and EOM are normal. Pupils are equal, round, and reactive to light.  Neck: Normal range of motion. Neck supple.  Cardiovascular: Normal rate, regular rhythm and normal heart sounds.   Respiratory: Effort normal and breath sounds normal.  GI: Soft. Bowel sounds are normal. She exhibits no distension. There is tenderness (right upper quadrant). There is no rebound and no guarding.  Musculoskeletal: Normal range of motion.  Neurological: She is alert and oriented to person, place, and time.  Skin: Skin is warm and  dry.     Assessment/Plan 39 year old female with acute cholecystitis possible choledocholithiasis.  1. We'll admit the hospital, IV fluids, antibiotics, nothing by mouth.  2. We'll repeat LFTs in a.m. Patient may require GI consult and ERCP prior to surgery. I discussed with her this option.  3. I discussed with the patient she will require laparoscopic cholecystectomy during  this hospital stay.  Rosario Jacks., Jerrelle Michelsen 03/09/2015, 8:29 PM

## 2015-03-09 NOTE — Progress Notes (Signed)
Received patient from ED. Patient AOx4, VS stable, ambulatory, pain level 3/10.  Patient oriented to floor, bed controls and call light.

## 2015-03-09 NOTE — ED Notes (Signed)
Pt reports to the ED for eval of RUQ abd pain and N/V. She reports the pain started on Friday night. She has not been eating much so she is unsure if gets worse with eating. She also reports she feels like she has been having fevers but she does not have a thermometer so she is unsure. Pt A&Ox4, resp e/u, and skin warm and dry.

## 2015-03-09 NOTE — ED Notes (Signed)
Pt again called for ready room and no answer.

## 2015-03-09 NOTE — ED Notes (Signed)
Pt no answer x3, for ready room.

## 2015-03-10 ENCOUNTER — Encounter (HOSPITAL_COMMUNITY): Admission: EM | Disposition: A | Discharge status: Home / Self Care | Payer: Self-pay | Source: Home / Self Care

## 2015-03-10 ENCOUNTER — Encounter (HOSPITAL_COMMUNITY): Payer: Self-pay | Admitting: Physician Assistant

## 2015-03-10 ENCOUNTER — Observation Stay (HOSPITAL_COMMUNITY): Payer: Medicaid Other

## 2015-03-10 ENCOUNTER — Observation Stay (HOSPITAL_COMMUNITY): Payer: Medicaid Other | Admitting: Certified Registered Nurse Anesthetist

## 2015-03-10 DIAGNOSIS — K810 Acute cholecystitis: Secondary | ICD-10-CM

## 2015-03-10 DIAGNOSIS — Z79899 Other long term (current) drug therapy: Secondary | ICD-10-CM | POA: Diagnosis not present

## 2015-03-10 DIAGNOSIS — K5900 Constipation, unspecified: Secondary | ICD-10-CM | POA: Diagnosis present

## 2015-03-10 DIAGNOSIS — D490 Neoplasm of unspecified behavior of digestive system: Secondary | ICD-10-CM | POA: Diagnosis present

## 2015-03-10 DIAGNOSIS — G629 Polyneuropathy, unspecified: Secondary | ICD-10-CM | POA: Diagnosis not present

## 2015-03-10 DIAGNOSIS — J9811 Atelectasis: Secondary | ICD-10-CM | POA: Diagnosis not present

## 2015-03-10 DIAGNOSIS — R1011 Right upper quadrant pain: Secondary | ICD-10-CM | POA: Diagnosis present

## 2015-03-10 DIAGNOSIS — Z794 Long term (current) use of insulin: Secondary | ICD-10-CM | POA: Diagnosis not present

## 2015-03-10 DIAGNOSIS — E1140 Type 2 diabetes mellitus with diabetic neuropathy, unspecified: Secondary | ICD-10-CM | POA: Diagnosis present

## 2015-03-10 DIAGNOSIS — J45909 Unspecified asthma, uncomplicated: Secondary | ICD-10-CM | POA: Diagnosis present

## 2015-03-10 DIAGNOSIS — J811 Chronic pulmonary edema: Secondary | ICD-10-CM | POA: Diagnosis not present

## 2015-03-10 DIAGNOSIS — Z6841 Body Mass Index (BMI) 40.0 and over, adult: Secondary | ICD-10-CM | POA: Diagnosis not present

## 2015-03-10 DIAGNOSIS — D649 Anemia, unspecified: Secondary | ICD-10-CM | POA: Diagnosis present

## 2015-03-10 DIAGNOSIS — F17210 Nicotine dependence, cigarettes, uncomplicated: Secondary | ICD-10-CM | POA: Diagnosis present

## 2015-03-10 DIAGNOSIS — K8062 Calculus of gallbladder and bile duct with acute cholecystitis without obstruction: Secondary | ICD-10-CM | POA: Diagnosis not present

## 2015-03-10 DIAGNOSIS — E785 Hyperlipidemia, unspecified: Secondary | ICD-10-CM | POA: Diagnosis present

## 2015-03-10 HISTORY — PX: CHOLECYSTECTOMY: SHX55

## 2015-03-10 LAB — SURGICAL PCR SCREEN
MRSA, PCR: NEGATIVE
Staphylococcus aureus: POSITIVE — AB

## 2015-03-10 LAB — GLUCOSE, CAPILLARY
GLUCOSE-CAPILLARY: 172 mg/dL — AB (ref 65–99)
GLUCOSE-CAPILLARY: 249 mg/dL — AB (ref 65–99)
GLUCOSE-CAPILLARY: 272 mg/dL — AB (ref 65–99)
Glucose-Capillary: 199 mg/dL — ABNORMAL HIGH (ref 65–99)
Glucose-Capillary: 246 mg/dL — ABNORMAL HIGH (ref 65–99)

## 2015-03-10 LAB — COMPREHENSIVE METABOLIC PANEL
ALK PHOS: 188 U/L — AB (ref 38–126)
ALT: 102 U/L — AB (ref 14–54)
AST: 50 U/L — AB (ref 15–41)
Albumin: 2.9 g/dL — ABNORMAL LOW (ref 3.5–5.0)
Anion gap: 12 (ref 5–15)
BILIRUBIN TOTAL: 2.3 mg/dL — AB (ref 0.3–1.2)
BUN: 6 mg/dL (ref 6–20)
CALCIUM: 8.4 mg/dL — AB (ref 8.9–10.3)
CO2: 19 mmol/L — ABNORMAL LOW (ref 22–32)
CREATININE: 0.69 mg/dL (ref 0.44–1.00)
Chloride: 106 mmol/L (ref 101–111)
GFR calc Af Amer: >60 mL/min (ref >60)
Glucose, Bld: 189 mg/dL — ABNORMAL HIGH (ref 65–99)
POTASSIUM: 3.8 mmol/L (ref 3.5–5.1)
Sodium: 137 mmol/L (ref 135–145)
TOTAL PROTEIN: 6.7 g/dL (ref 6.5–8.1)

## 2015-03-10 LAB — CBC
HCT: 33.5 % — ABNORMAL LOW (ref 36.0–46.0)
Hemoglobin: 9.8 g/dL — ABNORMAL LOW (ref 12.0–15.0)
MCH: 20.8 pg — ABNORMAL LOW (ref 26.0–34.0)
MCHC: 29.3 g/dL — ABNORMAL LOW (ref 30.0–36.0)
MCV: 71 fL — ABNORMAL LOW (ref 78.0–100.0)
PLATELETS: 332 10*3/uL (ref 150–400)
RBC: 4.72 MIL/uL (ref 3.87–5.11)
RDW: 16.9 % — AB (ref 11.5–15.5)
WBC: 6.7 10*3/uL (ref 4.0–10.5)

## 2015-03-10 LAB — LIPASE, BLOOD: LIPASE: 21 U/L — AB (ref 22–51)

## 2015-03-10 SURGERY — LAPAROSCOPIC CHOLECYSTECTOMY WITH INTRAOPERATIVE CHOLANGIOGRAM
Anesthesia: General | Site: Abdomen

## 2015-03-10 MED ORDER — HYDROMORPHONE HCL 1 MG/ML IJ SOLN
0.5000 mg | INTRAMUSCULAR | Status: DC | PRN
  Administered 2015-03-10: 1 mg via INTRAVENOUS
  Administered 2015-03-10 (×2): 2 mg via INTRAVENOUS
  Administered 2015-03-10 (×2): 1 mg via INTRAVENOUS
  Administered 2015-03-11 – 2015-03-12 (×13): 2 mg via INTRAVENOUS
  Administered 2015-03-12: 1 mg via INTRAVENOUS
  Administered 2015-03-13: 2 mg via INTRAVENOUS
  Administered 2015-03-13: 1 mg via INTRAVENOUS
  Filled 2015-03-10 (×11): qty 2
  Filled 2015-03-10: qty 1
  Filled 2015-03-10: qty 2
  Filled 2015-03-10: qty 1
  Filled 2015-03-10 (×2): qty 2
  Filled 2015-03-10: qty 1
  Filled 2015-03-10: qty 2
  Filled 2015-03-10 (×2): qty 1
  Filled 2015-03-10: qty 2

## 2015-03-10 MED ORDER — PHENYLEPHRINE HCL 10 MG/ML IJ SOLN
INTRAMUSCULAR | Status: DC | PRN
  Administered 2015-03-10: 80 ug via INTRAVENOUS
  Administered 2015-03-10 (×4): 40 ug via INTRAVENOUS
  Administered 2015-03-10: 80 ug via INTRAVENOUS
  Administered 2015-03-10: 40 ug via INTRAVENOUS

## 2015-03-10 MED ORDER — FENTANYL CITRATE (PF) 250 MCG/5ML IJ SOLN
INTRAMUSCULAR | Status: AC
  Filled 2015-03-10: qty 5

## 2015-03-10 MED ORDER — BUPIVACAINE-EPINEPHRINE (PF) 0.25% -1:200000 IJ SOLN
INTRAMUSCULAR | Status: AC
  Filled 2015-03-10: qty 30

## 2015-03-10 MED ORDER — ALBUTEROL SULFATE HFA 108 (90 BASE) MCG/ACT IN AERS
INHALATION_SPRAY | RESPIRATORY_TRACT | Status: AC
  Filled 2015-03-10: qty 6.7

## 2015-03-10 MED ORDER — NEOSTIGMINE METHYLSULFATE 10 MG/10ML IV SOLN
INTRAVENOUS | Status: AC
  Filled 2015-03-10: qty 2

## 2015-03-10 MED ORDER — SUCCINYLCHOLINE CHLORIDE 20 MG/ML IJ SOLN
INTRAMUSCULAR | Status: DC | PRN
  Administered 2015-03-10: 100 mg via INTRAVENOUS

## 2015-03-10 MED ORDER — PROMETHAZINE HCL 25 MG/ML IJ SOLN: 6.2500 mg | INTRAMUSCULAR | Status: DC | PRN

## 2015-03-10 MED ORDER — CHLORHEXIDINE GLUCONATE 0.12 % MT SOLN
15.0000 mL | Freq: Two times a day (BID) | OROMUCOSAL | Status: DC
  Administered 2015-03-10 – 2015-03-15 (×8): 15 mL via OROMUCOSAL
  Filled 2015-03-10 (×6): qty 15

## 2015-03-10 MED ORDER — MUPIROCIN 2 % EX OINT
TOPICAL_OINTMENT | Freq: Two times a day (BID) | CUTANEOUS | Status: DC
  Administered 2015-03-10 – 2015-03-15 (×10): via NASAL
  Administered 2015-03-15: 1 via NASAL
  Administered 2015-03-16: 11:00:00 via NASAL
  Filled 2015-03-10: qty 22

## 2015-03-10 MED ORDER — GLYCOPYRROLATE 0.2 MG/ML IJ SOLN
INTRAMUSCULAR | Status: DC | PRN
  Administered 2015-03-10: .8 mg via INTRAVENOUS

## 2015-03-10 MED ORDER — FENTANYL CITRATE (PF) 100 MCG/2ML IJ SOLN
INTRAMUSCULAR | Status: DC | PRN
  Administered 2015-03-10: 25 ug via INTRAVENOUS
  Administered 2015-03-10: 100 ug via INTRAVENOUS
  Administered 2015-03-10: 25 ug via INTRAVENOUS
  Administered 2015-03-10: 50 ug via INTRAVENOUS
  Administered 2015-03-10: 25 ug via INTRAVENOUS
  Administered 2015-03-10 (×2): 100 ug via INTRAVENOUS
  Administered 2015-03-10: 25 ug via INTRAVENOUS
  Administered 2015-03-10: 50 ug via INTRAVENOUS
  Administered 2015-03-10: 100 ug via INTRAVENOUS
  Administered 2015-03-10: 150 ug via INTRAVENOUS

## 2015-03-10 MED ORDER — 0.9 % SODIUM CHLORIDE (POUR BTL) OPTIME
TOPICAL | Status: DC | PRN
  Administered 2015-03-10: 1000 mL

## 2015-03-10 MED ORDER — GLYCOPYRROLATE 0.2 MG/ML IJ SOLN
INTRAMUSCULAR | Status: AC
  Filled 2015-03-10: qty 4

## 2015-03-10 MED ORDER — NEOSTIGMINE METHYLSULFATE 10 MG/10ML IV SOLN
INTRAVENOUS | Status: DC | PRN
  Administered 2015-03-10: 5 mg via INTRAVENOUS

## 2015-03-10 MED ORDER — DEXAMETHASONE SODIUM PHOSPHATE 4 MG/ML IJ SOLN
INTRAMUSCULAR | Status: DC | PRN
  Administered 2015-03-10: 4 mg via INTRAVENOUS

## 2015-03-10 MED ORDER — HYDROMORPHONE HCL 1 MG/ML IJ SOLN
INTRAMUSCULAR | Status: AC
  Filled 2015-03-10: qty 1

## 2015-03-10 MED ORDER — LIDOCAINE HCL (CARDIAC) 20 MG/ML IV SOLN
INTRAVENOUS | Status: DC | PRN
  Administered 2015-03-10: 80 mg via INTRAVENOUS

## 2015-03-10 MED ORDER — PROPOFOL 10 MG/ML IV BOLUS
INTRAVENOUS | Status: DC | PRN
Start: 2015-03-10 — End: 2015-03-10
  Administered 2015-03-10: 20 mg via INTRAVENOUS
  Administered 2015-03-10: 150 mg via INTRAVENOUS

## 2015-03-10 MED ORDER — PHENOL 1.4 % MT LIQD
1.0000 | OROMUCOSAL | Status: DC | PRN
  Administered 2015-03-10: 1 via OROMUCOSAL
  Filled 2015-03-10: qty 177

## 2015-03-10 MED ORDER — HYDROMORPHONE HCL 1 MG/ML IJ SOLN
0.2500 mg | INTRAMUSCULAR | Status: DC | PRN
  Administered 2015-03-10 (×4): 0.5 mg via INTRAVENOUS

## 2015-03-10 MED ORDER — WHITE PETROLATUM GEL
Status: AC
  Administered 2015-03-10: 17:00:00
  Filled 2015-03-10: qty 1

## 2015-03-10 MED ORDER — SODIUM CHLORIDE 0.9 % IV SOLN
INTRAVENOUS | Status: DC | PRN
  Administered 2015-03-10: 10:00:00

## 2015-03-10 MED ORDER — MIDAZOLAM HCL 2 MG/2ML IJ SOLN
INTRAMUSCULAR | Status: AC
  Filled 2015-03-10: qty 4

## 2015-03-10 MED ORDER — SODIUM CHLORIDE 0.9 % IR SOLN
Status: DC | PRN
  Administered 2015-03-10 (×2): 1000 mL

## 2015-03-10 MED ORDER — CETYLPYRIDINIUM CHLORIDE 0.05 % MT LIQD
7.0000 mL | Freq: Two times a day (BID) | OROMUCOSAL | Status: DC
  Administered 2015-03-11 – 2015-03-13 (×2): 7 mL via OROMUCOSAL

## 2015-03-10 MED ORDER — LACTATED RINGERS IV SOLN
INTRAVENOUS | Status: DC | PRN
  Administered 2015-03-10 (×3): via INTRAVENOUS

## 2015-03-10 MED ORDER — ALBUTEROL SULFATE HFA 108 (90 BASE) MCG/ACT IN AERS
INHALATION_SPRAY | RESPIRATORY_TRACT | Status: DC | PRN
  Administered 2015-03-10: 8 via RESPIRATORY_TRACT

## 2015-03-10 MED ORDER — OXYCODONE-ACETAMINOPHEN 5-325 MG PO TABS
1.0000 | ORAL_TABLET | ORAL | Status: DC | PRN
  Administered 2015-03-10 – 2015-03-12 (×4): 2 via ORAL
  Filled 2015-03-10 (×5): qty 2

## 2015-03-10 MED ORDER — ROCURONIUM BROMIDE 100 MG/10ML IV SOLN
INTRAVENOUS | Status: DC | PRN
Start: 2015-03-10 — End: 2015-03-10
  Administered 2015-03-10 (×2): 5 mg via INTRAVENOUS
  Administered 2015-03-10: 20 mg via INTRAVENOUS
  Administered 2015-03-10: 5 mg via INTRAVENOUS
  Administered 2015-03-10: 10 mg via INTRAVENOUS

## 2015-03-10 MED ORDER — ONDANSETRON HCL 4 MG/2ML IJ SOLN
INTRAMUSCULAR | Status: DC | PRN
  Administered 2015-03-10 (×2): 4 mg via INTRAVENOUS

## 2015-03-10 MED ORDER — BUPIVACAINE-EPINEPHRINE 0.25% -1:200000 IJ SOLN
INTRAMUSCULAR | Status: DC | PRN
  Administered 2015-03-10: 30 mL

## 2015-03-10 SURGICAL SUPPLY — 53 items
APPLIER CLIP 5 13 M/L LIGAMAX5 (MISCELLANEOUS) ×3
APPLIER CLIP ROT 10 11.4 M/L (STAPLE) ×3
BLADE SURG ROTATE 9660 (MISCELLANEOUS) IMPLANT
CANISTER SUCTION 2500CC (MISCELLANEOUS) ×3 IMPLANT
CHLORAPREP W/TINT 26ML (MISCELLANEOUS) ×3 IMPLANT
CLIP APPLIE 5 13 M/L LIGAMAX5 (MISCELLANEOUS) ×1 IMPLANT
CLIP APPLIE ROT 10 11.4 M/L (STAPLE) ×1 IMPLANT
COVER MAYO STAND STRL (DRAPES) ×3 IMPLANT
COVER SURGICAL LIGHT HANDLE (MISCELLANEOUS) ×3 IMPLANT
DRAPE C-ARM 42X72 X-RAY (DRAPES) ×3 IMPLANT
ELECT REM PT RETURN 9FT ADLT (ELECTROSURGICAL) ×3
ELECTRODE REM PT RTRN 9FT ADLT (ELECTROSURGICAL) ×1 IMPLANT
ENDOLOOP SUT PDS II  0 18 (SUTURE) ×2
ENDOLOOP SUT PDS II 0 18 (SUTURE) ×1 IMPLANT
FILTER SMOKE EVAC LAPAROSHD (FILTER) ×3 IMPLANT
GLOVE BIO SURGEON STRL SZ 6.5 (GLOVE) ×2 IMPLANT
GLOVE BIO SURGEON STRL SZ8 (GLOVE) ×3 IMPLANT
GLOVE BIO SURGEONS STRL SZ 6.5 (GLOVE) ×1
GLOVE BIOGEL PI IND STRL 6.5 (GLOVE) ×1 IMPLANT
GLOVE BIOGEL PI IND STRL 7.0 (GLOVE) ×1 IMPLANT
GLOVE BIOGEL PI IND STRL 7.5 (GLOVE) ×1 IMPLANT
GLOVE BIOGEL PI IND STRL 8 (GLOVE) ×2 IMPLANT
GLOVE BIOGEL PI INDICATOR 6.5 (GLOVE) ×2
GLOVE BIOGEL PI INDICATOR 7.0 (GLOVE) ×2
GLOVE BIOGEL PI INDICATOR 7.5 (GLOVE) ×2
GLOVE BIOGEL PI INDICATOR 8 (GLOVE) ×4
GLOVE SURG SS PI 7.0 STRL IVOR (GLOVE) ×3 IMPLANT
GLOVE SURG SS PI 7.5 STRL IVOR (GLOVE) ×6 IMPLANT
GOWN STRL REUS W/ TWL LRG LVL3 (GOWN DISPOSABLE) ×4 IMPLANT
GOWN STRL REUS W/ TWL XL LVL3 (GOWN DISPOSABLE) ×1 IMPLANT
GOWN STRL REUS W/TWL LRG LVL3 (GOWN DISPOSABLE) ×8
GOWN STRL REUS W/TWL XL LVL3 (GOWN DISPOSABLE) ×2
KIT BASIN OR (CUSTOM PROCEDURE TRAY) ×3 IMPLANT
KIT ROOM TURNOVER OR (KITS) ×3 IMPLANT
LIQUID BAND (GAUZE/BANDAGES/DRESSINGS) ×6 IMPLANT
NEEDLE 22X1 1/2 (OR ONLY) (NEEDLE) ×3 IMPLANT
NS IRRIG 1000ML POUR BTL (IV SOLUTION) ×3 IMPLANT
PAD ARMBOARD 7.5X6 YLW CONV (MISCELLANEOUS) ×3 IMPLANT
POUCH RETRIEVAL ECOSAC 10 (ENDOMECHANICALS) ×1 IMPLANT
POUCH RETRIEVAL ECOSAC 10MM (ENDOMECHANICALS) ×2
SCISSORS LAP 5X35 DISP (ENDOMECHANICALS) ×3 IMPLANT
SET CHOLANGIOGRAPH 5 50 .035 (SET/KITS/TRAYS/PACK) ×3 IMPLANT
SET IRRIG TUBING LAPAROSCOPIC (IRRIGATION / IRRIGATOR) ×3 IMPLANT
SLEEVE ENDOPATH XCEL 5M (ENDOMECHANICALS) ×6 IMPLANT
SPECIMEN JAR SMALL (MISCELLANEOUS) ×3 IMPLANT
SUT VIC AB 4-0 PS2 27 (SUTURE) ×3 IMPLANT
TOWEL OR 17X24 6PK STRL BLUE (TOWEL DISPOSABLE) ×3 IMPLANT
TOWEL OR 17X26 10 PK STRL BLUE (TOWEL DISPOSABLE) ×3 IMPLANT
TRAY LAPAROSCOPIC MC (CUSTOM PROCEDURE TRAY) ×3 IMPLANT
TROCAR BLADELESS 11MM (ENDOMECHANICALS) ×3 IMPLANT
TROCAR XCEL BLUNT TIP 100MML (ENDOMECHANICALS) ×3 IMPLANT
TROCAR XCEL NON-BLD 5MMX100MML (ENDOMECHANICALS) ×3 IMPLANT
TUBING INSUFFLATION (TUBING) ×3 IMPLANT

## 2015-03-10 NOTE — Progress Notes (Signed)
Returned from PACU at this time, c/o bilateral shoulder pain, reoriented to room and surroundings, denies nausea

## 2015-03-10 NOTE — Anesthesia Preprocedure Evaluation (Signed)
Anesthesia Evaluation  Patient identified by MRN, date of birth, ID band Patient awake    Reviewed: Allergy & Precautions, NPO status , Patient's Chart, lab work & pertinent test results  Airway Mallampati: II  TM Distance: >3 FB Neck ROM: Full    Dental   Pulmonary Current Smoker,  breath sounds clear to auscultation        Cardiovascular negative cardio ROS  Rhythm:Regular Rate:Normal     Neuro/Psych    GI/Hepatic negative GI ROS,   Endo/Other  diabetes  Renal/GU negative Renal ROS     Musculoskeletal   Abdominal   Peds  Hematology   Anesthesia Other Findings   Reproductive/Obstetrics                             Anesthesia Physical Anesthesia Plan  ASA: III  Anesthesia Plan: General   Post-op Pain Management:    Induction: Intravenous  Airway Management Planned: Oral ETT  Additional Equipment:   Intra-op Plan:   Post-operative Plan: Extubation in OR  Informed Consent: I have reviewed the patients History and Physical, chart, labs and discussed the procedure including the risks, benefits and alternatives for the proposed anesthesia with the patient or authorized representative who has indicated his/her understanding and acceptance.   Dental advisory given  Plan Discussed with: CRNA, Anesthesiologist and Surgeon  Anesthesia Plan Comments:         Anesthesia Quick Evaluation

## 2015-03-10 NOTE — Op Note (Signed)
03/09/2015 - 03/10/2015  11:29 AM  PATIENT:  Julie Robertson  39 y.o. female  PRE-OPERATIVE DIAGNOSIS:  Acute cholecystitis  POST-OPERATIVE DIAGNOSIS:  Acute cholecystitis, likely choledocholithiasis  PROCEDURE:  Procedure(s): LAPAROSCOPIC CHOLECYSTECTOMY WITH INTRAOPERATIVE CHOLANGIOGRAM  SURGEON:  Surgeon(s): Julie Skeans, MD  ASSISTANTS: Saverio Danker, [facility]   ANESTHESIA:   local and general  EBL:  Total I/O In: 1000 [I.V.:1000] Out: 25 [Blood:25]  BLOOD ADMINISTERED:none  DRAINS: none   SPECIMEN:  Excision  DISPOSITION OF SPECIMEN:  PATHOLOGY  COUNTS:  YES  DICTATION: .Dragon Dictation  Findings: Severe acute cholecystitis with empyema of the gallbladder. Cholangiogram showed significant extra and intrahepatic biliary ductal dilatation with likely filling defects gathered in the distal CBD. A trickle of contrast passed into the duodenum.  Julie Robertson presents for laparoscopic cholecystectomy with cholangiolar exam. Informed consent was obtained by myself in [place]. She also spoke with Dr. Rosendo Gros. She is on intravenous antibody protocol. She was worked up in room and general endotracheal anesthesia was administered by the anesthesia staff. Her abdomen was prepped and draped in sterile fashion. We did time out procedure. Supra-umbilical region was infiltrated with local. Supra-umbilical incision was made. Subcutaneous tissues were dissected down revealing the anterior fascia. This was divided sharply along the midline. Peritoneal cavity was entered under direct vision without complication. A 0 Vicryl pursestring was placed around the fascial opening. Hassan trocar was inserted into the abdomen. The abdomen was insufflated with carbon dioxide in standard fashion. Under direct vision a 5 mm epigastric and 5 mm right lateral ports 2 placed. Local was used at each port site. Laparoscopic expiration revealed a very distended and inflamed gallbladder. The dome was retracted  superior medially. The infundibulum was retracted inferior laterally. Dissection began laterally and progressed medially. We easily identified the cystic duct which was large in diameter. Was identified the cystic artery. During dissection, the cystic artery bled a little bit so it was controlled with 2 clips proximally and clips distally. It was not divided at this time. Further dissection clearly identified a critical view of the cystic duct. Due to the large size of the cystic duct, we upsized her epigastric port to 11 mm. Clip was placed on the infundibular cystic duct junction. Small nick was made in the cystic duct. Cholangiogram was performed demonstrating significant dilatation of the intra-and extrahepatic biliary tract with distal common bile duct stones versus sludge. A trickle of contrast into the duodenum. Cholangiocatheter was removed. 2 clips were placed proximally on the cystic duct and it was divided. Then placed an Endoloop on the cystic duct for more secure occlusion due to its large diameter. The cystic artery, previously clipped, was divided. Gallbladder was taken off the liver bed using Bovie cautery achieving excellent hemostasis. Gallbladder was placed in a bag and removed from the supra umbilical port site. Abdomen was copiously irrigated and irrigation fluid returned clear. Liver bed was cauterized getting good hemostasis. Clips were confirmed in good position. Pneumoperitoneum was released. Ports were removed under direct vision. Supra-and local fascia was closed by tying the pursestring. All 4 wounds were copiously irrigated and the skin of each was closed with running 4 Vicryl subcuticular followed by liquid band. All counts were correct and she tolerated procedure well. There were no apparent complications. She was taken recovery in stable condition.  PATIENT DISPOSITION:  PACU - hemodynamically stable.   Delay start of Pharmacological VTE agent (>24hrs) due to surgical blood loss  or risk of bleeding:  no  Julie Skeans, MD,  MPH, FACS Pager: [phone]  9/6/201611:29 AM

## 2015-03-10 NOTE — Progress Notes (Signed)
Patient ID: Julie Robertson, female   DOB: 30-Mar-1976, 39 y.o.   MRN: [id number]    Subjective: Pt still with pain today.  No other new complaints.  Spoke through [facility]   Objective: Vital signs in last 24 hours: Temp:  [97.8 F (36.6 C)-98.4 F (36.9 C)] 98.1 F (36.7 C) (09/06 0434) Pulse Rate:  [79-105] 89 (09/06 0434) Resp:  [14-19] 18 (09/06 0434) BP: (109-129)/(45-80) 115/66 mmHg (09/06 0434) SpO2:  [96 %-100 %] 97 % (09/06 0434) Weight:  [113.6 kg (250 lb 7.1 oz)] 113.6 kg (250 lb 7.1 oz) (09/05 2116)    Intake/Output from previous day: 09/05 0701 - 09/06 0700 In: 957 [I.V.:907; IV Piggyback:50] Out: -  Intake/Output this shift:    PE: Abd: soft, very tender in RUQ and epigastrium, +BS, ND, but obese Heart: regular Lungs: CTAB  Lab Results:   Recent Labs  03/09/15 1356 03/10/15 0528  WBC 8.3 6.7  HGB 10.4* 9.8*  HCT 36.1 33.5*  PLT 376 332   BMET  Recent Labs  03/09/15 1356 03/10/15 0528  NA 135 137  K 4.0 3.8  CL 105 106  CO2 21* 19*  GLUCOSE 263* 189*  BUN 9 6  CREATININE 0.77 0.69  CALCIUM 9.0 8.4*   PT/INR No results for input(s): LABPROT, INR in the last 72 hours. CMP     Component Value Date/Time   NA 137 03/10/2015 0528   K 3.8 03/10/2015 0528   CL 106 03/10/2015 0528   CO2 19* 03/10/2015 0528   GLUCOSE 189* 03/10/2015 0528   BUN 6 03/10/2015 0528   CREATININE 0.69 03/10/2015 0528   CREATININE 0.84 12/17/2014 1749   CALCIUM 8.4* 03/10/2015 0528   PROT 6.7 03/10/2015 0528   ALBUMIN 2.9* 03/10/2015 0528   AST 50* 03/10/2015 0528   ALT 102* 03/10/2015 0528   ALKPHOS 188* 03/10/2015 0528   BILITOT 2.3* 03/10/2015 0528   GFRNONAA >60 03/10/2015 0528   GFRNONAA 87 12/17/2014 1749   GFRAA >60 03/10/2015 0528   GFRAA >89 12/17/2014 1749   Lipase     Component Value Date/Time   LIPASE 21* 03/10/2015 0528       Studies/Results: US Abdomen Limited Ruq  03/09/2015   CLINICAL DATA:  Right upper quadrant  pain. Nausea and vomiting for 3 days.  EXAM: US ABDOMEN LIMITED - RIGHT UPPER QUADRANT  COMPARISON:  None.  FINDINGS: Gallbladder:  Cholelithiasis without pericholecystic fluid or gallbladder wall thickening. Negative sonographic Murphy sign.  Common bile duct:  Diameter: 12 mm  Liver:  No focal lesion identified. Within normal limits in parenchymal echogenicity.  IMPRESSION: 1. Cholelithiasis without sonographic evidence of acute cholecystitis. Dilated common bile duct measuring 12 mm without an obstructing lesion or choledocholithiasis. If there is continue clinical concern, further evaluation with MRCP is recommended.   Electronically Signed   By: Kathreen Devoid   On: 03/09/2015 19:07    Anti-infectives: Anti-infectives    Start     Dose/Rate Route Frequency Ordered Stop   03/09/15 2045  cefTRIAXone (ROCEPHIN) 2 g in dextrose 5 % 50 mL IVPB     2 g 100 mL/hr over 30 Minutes Intravenous Every 24 hours 03/09/15 2037         Assessment/Plan  1. Acute cholecystitis -to OR today for lap chole with IOC if possible.  I d/w patient with an interpretor.  She understands and is agreeable to proceed. -cont Rocephin -LFTs down some today.  If IOC positive, will call GI  for ERCP -all questions answered.      Selwyn Reason E 03/10/2015, 8:57 AM Pager: [phone]

## 2015-03-10 NOTE — Anesthesia Procedure Notes (Signed)
Procedure Name: Intubation Date/Time: 03/10/2015 10:16 AM Performed by: Merdis Delay Pre-anesthesia Checklist: Patient identified, Patient being monitored, Emergency Drugs available, Suction available and Timeout performed Patient Re-evaluated:Patient Re-evaluated prior to inductionOxygen Delivery Method: Circle system utilized Preoxygenation: Pre-oxygenation with 100% oxygen Intubation Type: IV induction Ventilation: Mask ventilation without difficulty Laryngoscope Size: Mac and 3 Grade View: Grade I Tube type: Oral Tube size: 7.0 mm Number of attempts: 1 Airway Equipment and Method: Stylet Placement Confirmation: ETT inserted through vocal cords under direct vision,  positive ETCO2,  CO2 detector and breath sounds checked- equal and bilateral Secured at: 22 cm Tube secured with: Tape Dental Injury: Teeth and Oropharynx as per pre-operative assessment  Difficulty Due To: Difficulty was anticipated Comments: Tight oral opening after induction, large neck circumference, short neck

## 2015-03-10 NOTE — Consult Note (Signed)
[facility] Consult: 2:21 PM 03/10/2015     Referring Provider: Dr Grandville Silos  Primary Care Physician:  Lorayne Marek, MD Primary Gastroenterologist:  unassigned     Reason for Consultation:  Intraooperative cholangiogram this AM with significant dilatation of the intra-and extrahepatic biliary tract with distal common bile duct stones versus sludge. A trickle of contrast into the duodenum    HPI: Julie Robertson is a 39 y.o. female.  She doesn't speak [place] and communication mediated by Electronics engineer. Type 2 IDDM.  Dyslipidemia.  Obesity with BMI 41. LFTs elevated in 2011, ultrasound then: 7 mm "prominent for age" CBD, gallstones. Smoker.   Presented yesterday afternoon to ED with 2 days acute onset n/v, abdominal pain. ? Fevers.  Multiple gallstones and 12 mm CBD on ultrasound. Lipase 21. LFTs elevated with t bil 3.0.  However these declined today and so went to routine lap chole with abnormal IOC as noted above.    Now, a few hours post surgery, the patient is complaining of back pain, she has an oxygen mask on.    Past Medical History  Diagnosis Date  . Diabetes mellitus without complication   . Dyslipidemia 08/2009  . Gall stones 08/2009  . 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  numbness tingling in right fingers. rx with Gabapentin.  . Ingrown toenail 03/2015    right  hallux  . Obesity     BMI 41, 250# 03/2015    Past Surgical History  Procedure Laterality Date  . Tubal ligation      Prior to Admission medications   Medication Sig Start Date End Date Taking? Authorizing Provider  atorvastatin (LIPITOR) 10 MG tablet Take 1 tablet (10 mg total) by mouth daily. Patient taking differently: Take 10 mg by mouth daily at 6 PM.  12/17/14  Yes Deepak Advani, MD  cephALEXin  (KEFLEX) 500 MG capsule Take 1 capsule (500 mg total) by mouth 4 (four) times daily. Patient taking differently: Take 500 mg by mouth 4 (four) times daily. STARTED 03/06/15, FOR 5 DAYS ENDING 03/10/15 03/06/15  Yes Leone Brand, MD  gabapentin (NEURONTIN) 300 MG capsule Take 1 capsule (300 mg total) by mouth 3 (three) times daily. 12/17/14  Yes Lorayne Marek, MD  ibuprofen (ADVIL,MOTRIN) 200 MG tablet Take 400 mg by mouth every 6 (six) hours as needed for moderate pain.   Yes Historical Provider, MD  insulin NPH-regular Human (HUMULIN 70/30) (70-30) 100 UNIT/ML injection INJECT 17 UNITS UNDER THE SKIN 2 TIMES DAILY Patient taking differently: Inject 18 Units into the skin 2 (two) times daily with a meal.  12/17/14  Yes Lorayne Marek, MD  metFORMIN (GLUCOPHAGE) 1000 MG tablet Take 1 tablet (1,000 mg total) by mouth 2 (two) times daily with a meal. 12/17/14  Yes Deepak Advani, MD  albuterol (PROVENTIL HFA;VENTOLIN HFA) 108 (90 BASE) MCG/ACT inhaler Inhale 2 puffs into the lungs every 6 (six) hours as needed for wheezing or shortness of breath. 12/17/14   Lorayne Marek, MD  azithromycin (ZITHROMAX Z-PAK) 250 MG tablet Bronchitis Patient  not taking: Reported on 10/17/2014 10/02/14   Brayton Caves, PA-C  glucose monitoring kit (FREESTYLE) monitoring kit 1 each by Does not apply route 4 (four) times daily - after meals and at bedtime. 1 month Diabetic Testing Supplies for QAC-QHS accuchecks. 07/03/13   Lorayne Marek, MD  guaiFENesin-codeine 100-10 MG/5ML syrup Take 5 mLs by mouth every 4 (four) hours as needed for cough. Patient not taking: Reported on 10/17/2014 10/02/14   Brayton Caves, PA-C  Insulin Pen Needle 31G X 5 MM MISC Inject 10 units subcutaneous at bedtime 07/03/13   Lorayne Marek, MD  magnesium hydroxide (MILK OF MAGNESIA) 400 MG/5ML suspension Take 5 mLs by mouth daily as needed for mild constipation. Patient not taking: Reported on 10/02/2014 07/03/13   Lorayne Marek, MD  nicotine (NICODERM CQ - DOSED  IN MG/24 HOURS) 14 mg/24hr patch Place 1 patch (14 mg total) onto the skin daily. Patient not taking: Reported on 10/02/2014 07/03/13   Lorayne Marek, MD    Scheduled Meds: . antiseptic oral rinse  7 mL Mouth Rinse q12n4p  . cefTRIAXone (ROCEPHIN)  IV  2 g Intravenous Q24H  . chlorhexidine  15 mL Mouth Rinse BID  . HYDROmorphone      . HYDROmorphone      . insulin aspart  0-20 Units Subcutaneous TID WC  . mupirocin ointment   Nasal BID   Infusions: . sodium chloride 125 mL/hr at 03/09/15 2120   PRN Meds: diphenhydrAMINE **OR** diphenhydrAMINE, hydrALAZINE, HYDROmorphone (DILAUDID) injection, ondansetron **OR** ondansetron (ZOFRAN) IV, oxyCODONE-acetaminophen   Allergies as of 03/09/2015  . (No Known Allergies)    Family History  Problem Relation Age of Onset  . Diabetes Mother   . Heart disease Mother   . Cancer Mother   . Cancer Father   . Diabetes Father   . Heart disease Father     Social History   Social History  . Marital Status: Legally Separated    Spouse Name: N/A  . Number of Children: N/A  . Years of Education: N/A   Occupational History  . Not on file.   Social History Main Topics  . Smoking status: Current Every Day Smoker -- 1.50 packs/day for 4 years    Types: Cigarettes  . Smokeless tobacco: Not on file  . Alcohol Use: Yes     Comment: socially  . Drug Use: No  . Sexual Activity: Not on file   Other Topics Concern  . Not on file   Social History Narrative   Patient has 2 sons one is 68, the other 53 as of 03/2015. Her kids are not the children of her current husband. As of 03/2015 she is not employed outside the home.    REVIEW OF SYSTEMS: Constitutional:  Generally no issues with weakness or mobility. Her weight is down 12# compared with 4-1/2 months ago. ENT:  No nose bleeds Pulm:  Her abdominal/back pain is worse with deep breathing. No cough. CV:  No palpitations, no LE edema. No chest pain. GU:  No hematuria, no frequency.  No  tea-colored urine GI:  Per HPI.  Generally patient doesn't have a lot of GI complaints. She eats well. Heme:  No issues with excessive bleeding or bruising.   Transfusions:  None in past. Neuro:  No headaches, no peripheral tingling or numbness Derm:  No itching, no rash or sores.  Endocrine:  No sweats or chills.  No polyuria or dysuria Immunization:  She received her flu and pneumococcal vaccination 09/2014.  Travel:  None beyond local counties in last few months.    PHYSICAL EXAM: Vital signs in last 24 hours: Filed Vitals:   03/10/15 1409  BP: 150/85  Pulse: 107  Temp: 98.5 F (36.9 C)  Resp: 24   Wt Readings from Last 3 Encounters:  03/09/15 250 lb 7.1 oz (113.6 kg)  12/17/14 265 lb (120.203 kg)  10/02/14 262 lb (118.842 kg)    General: Obese, uncomfortable Hispanic female.  She is drowsy postop. Head:  No asymmetry. Face is slightly cushingoid versus just obese.  Eyes:  No scleral icterus, no conjunctival pallor. Ears:  Not hard of hearing.  Nose:  No congestion or discharge. Oxygen facemask  in place. Mouth:  Clear, moist. Fair dentition. Neck:  No mass, no JVD. Lungs:  Clear bilaterally. Overall reduced breath sounds with shallow breathing and slight tachypnea. Heart: RRR. No MRG. S1/S2 audible. Abdomen:  Soft, ND.  Obese.  Minimal bruising at umbilicus incision. No fresh blood at this or other surgical incisions. Generally tender but worse on the right side. Bowel sounds hypoactive..   Rectal: Deferred   Musc/Skeltl: No gross joint deformity, erythema or swelling. Extremities:  No CCE. Feet warm.  Neurologic:  Unable to assess orientation as she is still drowsy from surgery. Affect is appropriate. Follows commands. Skin:  No jaundice Tattoos:  None seen Nodes:  No cervical or inguinal adenopathy.   Psych:  Somewhat sedated and calm.  Intake/Output from previous day: 09/05 0701 - 09/06 0700 In: 957 [I.V.:907; IV Piggyback:50] Out: -  Intake/Output this  shift: Total I/O In: 2050 [I.V.:2050] Out: 25 [Blood:25]  LAB RESULTS:  Recent Labs  03/09/15 1356 03/10/15 0528  WBC 8.3 6.7  HGB 10.4* 9.8*  HCT 36.1 33.5*  PLT 376 332   BMET Lab Results  Component Value Date   NA 137 03/10/2015   NA 135 03/09/2015   NA 138 12/17/2014   K 3.8 03/10/2015   K 4.0 03/09/2015   K 4.4 12/17/2014   CL 106 03/10/2015   CL 105 03/09/2015   CL 106 12/17/2014   CO2 19* 03/10/2015   CO2 21* 03/09/2015   CO2 24 12/17/2014   GLUCOSE 189* 03/10/2015   GLUCOSE 263* 03/09/2015   GLUCOSE 125* 12/17/2014   BUN 6 03/10/2015   BUN 9 03/09/2015   BUN 14 12/17/2014   CREATININE 0.69 03/10/2015   CREATININE 0.77 03/09/2015   CREATININE 0.84 12/17/2014   CALCIUM 8.4* 03/10/2015   CALCIUM 9.0 03/09/2015   CALCIUM 8.4 12/17/2014   LFT  Recent Labs  03/09/15 1356 03/10/15 0528  PROT 7.0 6.7  ALBUMIN 3.2* 2.9*  AST 57* 50*  ALT 135* 102*  ALKPHOS 223* 188*  BILITOT 3.0* 2.3*   PT/INR No results found for: INR, PROTIME Hepatitis Panel No results for input(s): HEPBSAG, HCVAB, HEPAIGM, HEPBIGM in the last 72 hours. Lipase     Component Value Date/Time   LIPASE 21* 03/10/2015 0528    Drugs of Abuse  No results found for: LABOPIA, COCAINSCRNUR, LABBENZ, AMPHETMU, THCU, LABBARB   RADIOLOGY STUDIES: Dg Cholangiogram Operative  03/10/2015   CLINICAL DATA:  Cholelithiasis  EXAM: INTRAOPERATIVE CHOLANGIOGRAM  TECHNIQUE: Cholangiographic images from the C-arm fluoroscopic device were submitted for interpretation post-operatively. Please see the procedural report for the amount of contrast and the fluoroscopy time utilized.  COMPARISON:  Ultrasound 03/09/2015  FINDINGS: Partially occlusive intraluminal filling defects versus mucosal irregularity in the distal CBD. Contrast does flow into the duodenum. The intrahepatic bile ducts  are incompletely opacified, mildly distended centrally. No leak from the cystic duct.  IMPRESSION: 1. Partially occlusive  filling defects versus mucosal irregularity in the distal CBD. Consider ERCP for further characterization.   Electronically Signed   By: Lucrezia Europe M.D.   On: 03/10/2015 11:09   US Abdomen Limited Ruq  03/09/2015   CLINICAL DATA:  Right upper quadrant pain. Nausea and vomiting for 3 days.  EXAM: US ABDOMEN LIMITED - RIGHT UPPER QUADRANT  COMPARISON:  None.  FINDINGS: Gallbladder:  Cholelithiasis without pericholecystic fluid or gallbladder wall thickening. Negative sonographic Murphy sign.  Common bile duct:  Diameter: 12 mm  Liver:  No focal lesion identified. Within normal limits in parenchymal echogenicity.  IMPRESSION: 1. Cholelithiasis without sonographic evidence of acute cholecystitis. Dilated common bile duct measuring 12 mm without an obstructing lesion or choledocholithiasis. If there is continue clinical concern, further evaluation with MRCP is recommended.   Electronically Signed   By: Kathreen Devoid   On: 03/09/2015 19:07    ENDOSCOPIC STUDIES: None   IMPRESSION:   *  Abnormal IOC, likely choledocholithiasis  *  Acute cholecystitis.  S/p lap chole  *  Type 2 IDDM.  Historically lax control.      PLAN:     *  ERCP set up for tomorrow at 12:15. Through the phone based translator, case discussed with patient but more so with her husband. This includes risks of infection, pancreatitis, perforation, lack of success which might lead to stent placement. Patient's husband is agreeable to proceed. All all questions answered.  As she is on daily Rocephin, will not add additional preoperative anti-biotic.   Azucena Freed  03/10/2015, 2:21 PM Pager: [phone]     ________________________________________________________________________  Rudolpho Sevin MD note:  I personally examined the patient, reviewed the data and agree with the assessment and plan described above.  Planning for ERCP tomorrow.   Owens Loffler, MD [facility] Pager [phone]

## 2015-03-10 NOTE — Transfer of Care (Signed)
Immediate Anesthesia Transfer of Care Note  Patient: Julie Robertson  Procedure(s) Performed: Procedure(s): LAPAROSCOPIC CHOLECYSTECTOMY WITH INTRAOPERATIVE CHOLANGIOGRAM (N/A)  Patient Location: PACU  Anesthesia Type:General  Level of Consciousness: awake, alert  and oriented  Airway & Oxygen Therapy: Patient Spontanous Breathing and Patient connected to face mask oxygen  Post-op Assessment: Report given to RN and Post -op Vital signs reviewed and stable  Post vital signs: Reviewed and stable  Last Vitals:  Filed Vitals:   03/10/15 0859  BP: 112/69  Pulse: 74  Temp: 36.8 C  Resp: 18    Complications: No apparent anesthesia complications

## 2015-03-10 NOTE — Progress Notes (Signed)
Inpatient Diabetes Program Recommendations  AACE/ADA: New Consensus Statement on Inpatient Glycemic Control (2013)  Target Ranges:  Prepandial:   less than 140 mg/dL      Peak postprandial:   less than 180 mg/dL (1-2 hours)      Critically ill patients:  140 - 180 mg/dL   Results for [name], Julie Robertson (MRN [id number]) as of 03/10/2015 16:20  Ref. Range 03/10/2015 04:24 03/10/2015 07:59 03/10/2015 11:58  Glucose-Capillary Latest Ref Range: 65-99 mg/dL 172 (H) 199 (H) 249 (H)   On Humulin 70/30 18 units bid at home.  Inpatient Diabetes Program Recommendations Insulin - Basal: Add Lantus 10 units Q24H Correction (SSI): Novolog resistant Q4 until po intake and diet increases  Note: Will follow. Thank you. Lorenda Peck, RD, LDN, CDE Inpatient Diabetes Coordinator [phone]

## 2015-03-11 ENCOUNTER — Inpatient Hospital Stay (HOSPITAL_COMMUNITY): Payer: Medicaid Other

## 2015-03-11 ENCOUNTER — Inpatient Hospital Stay (HOSPITAL_COMMUNITY): Payer: Medicaid Other | Admitting: Anesthesiology

## 2015-03-11 ENCOUNTER — Encounter (HOSPITAL_COMMUNITY): Payer: Self-pay

## 2015-03-11 ENCOUNTER — Encounter (HOSPITAL_COMMUNITY): Admission: EM | Disposition: A | Discharge status: Home / Self Care | Payer: Self-pay | Source: Home / Self Care

## 2015-03-11 DIAGNOSIS — G629 Polyneuropathy, unspecified: Secondary | ICD-10-CM | POA: Diagnosis not present

## 2015-03-11 DIAGNOSIS — C249 Malignant neoplasm of biliary tract, unspecified: Secondary | ICD-10-CM

## 2015-03-11 DIAGNOSIS — Z6841 Body Mass Index (BMI) 40.0 and over, adult: Secondary | ICD-10-CM | POA: Diagnosis not present

## 2015-03-11 DIAGNOSIS — K8062 Calculus of gallbladder and bile duct with acute cholecystitis without obstruction: Secondary | ICD-10-CM | POA: Diagnosis not present

## 2015-03-11 DIAGNOSIS — K831 Obstruction of bile duct: Secondary | ICD-10-CM

## 2015-03-11 DIAGNOSIS — K8050 Calculus of bile duct without cholangitis or cholecystitis without obstruction: Secondary | ICD-10-CM

## 2015-03-11 DIAGNOSIS — J811 Chronic pulmonary edema: Secondary | ICD-10-CM | POA: Diagnosis not present

## 2015-03-11 HISTORY — PX: ERCP: SHX5425

## 2015-03-11 LAB — HEMOGLOBIN A1C
HEMOGLOBIN A1C: 9.5 % — AB (ref 4.8–5.6)
MEAN PLASMA GLUCOSE: 226 mg/dL

## 2015-03-11 LAB — GLUCOSE, CAPILLARY
GLUCOSE-CAPILLARY: 200 mg/dL — AB (ref 65–99)
GLUCOSE-CAPILLARY: 302 mg/dL — AB (ref 65–99)
Glucose-Capillary: 222 mg/dL — ABNORMAL HIGH (ref 65–99)
Glucose-Capillary: 234 mg/dL — ABNORMAL HIGH (ref 65–99)

## 2015-03-11 IMAGING — RF DG ERCP WO/W SPHINCTEROTOMY
1 series · 4 of 4 positions shown · non-contrast
Comparison: None.

CLINICAL DATA: Stent

EXAM:
ERCP
TECHNIQUE: Multiple spot images obtained with the fluoroscopic device and
submitted for interpretation post-procedure.
FLUOROSCOPY TIME:  Radiation Exposure Index (as provided by the
fluoroscopic device): See below
If the device does not provide the exposure index:
Fluoroscopy Time:  3 minutes and 10 seconds
Number of Acquired Images:  4

[Series 1: run · 4 of 4 slices shown]
[im 1/4]
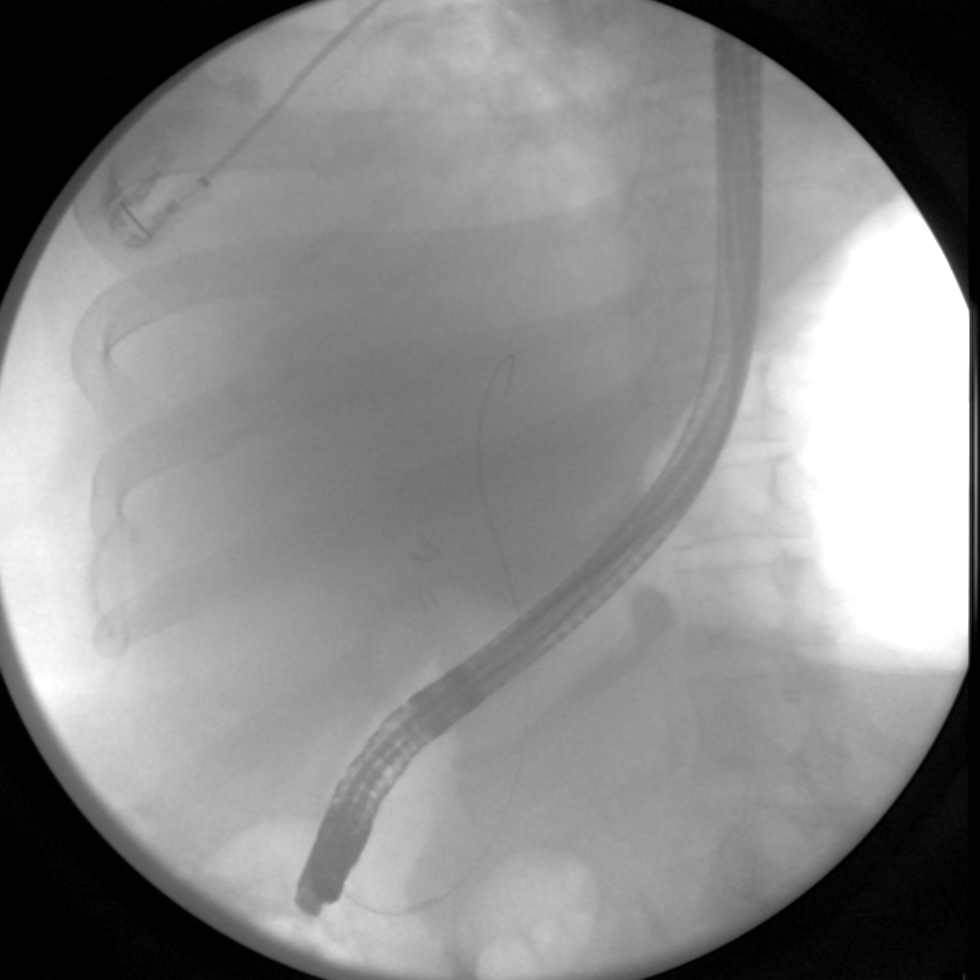
[im 2/4]
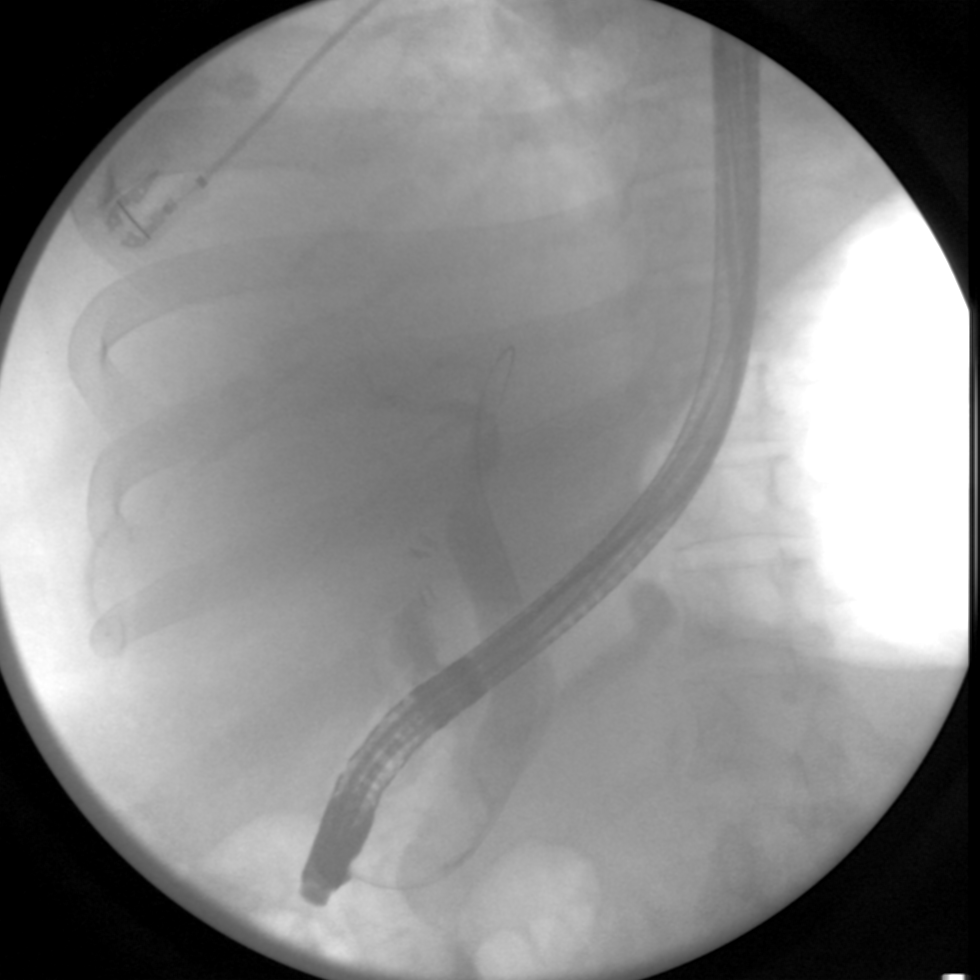
[im 3/4]
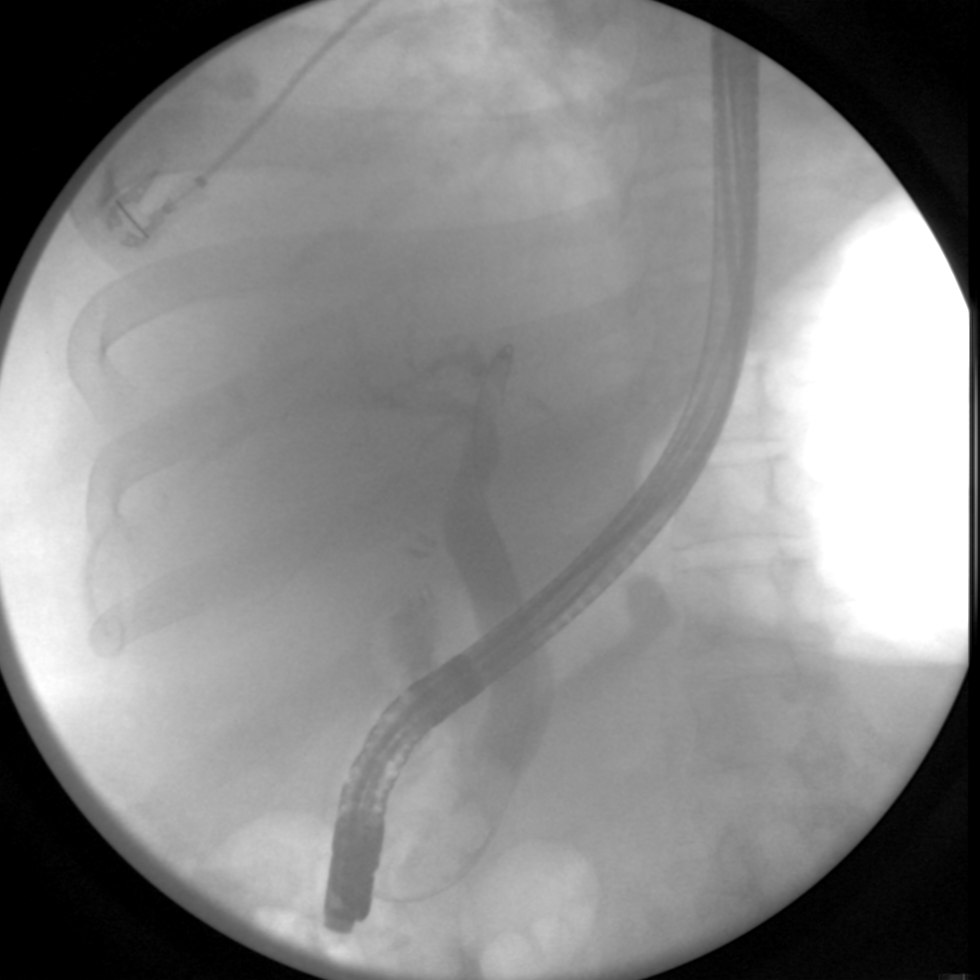
[im 4/4]
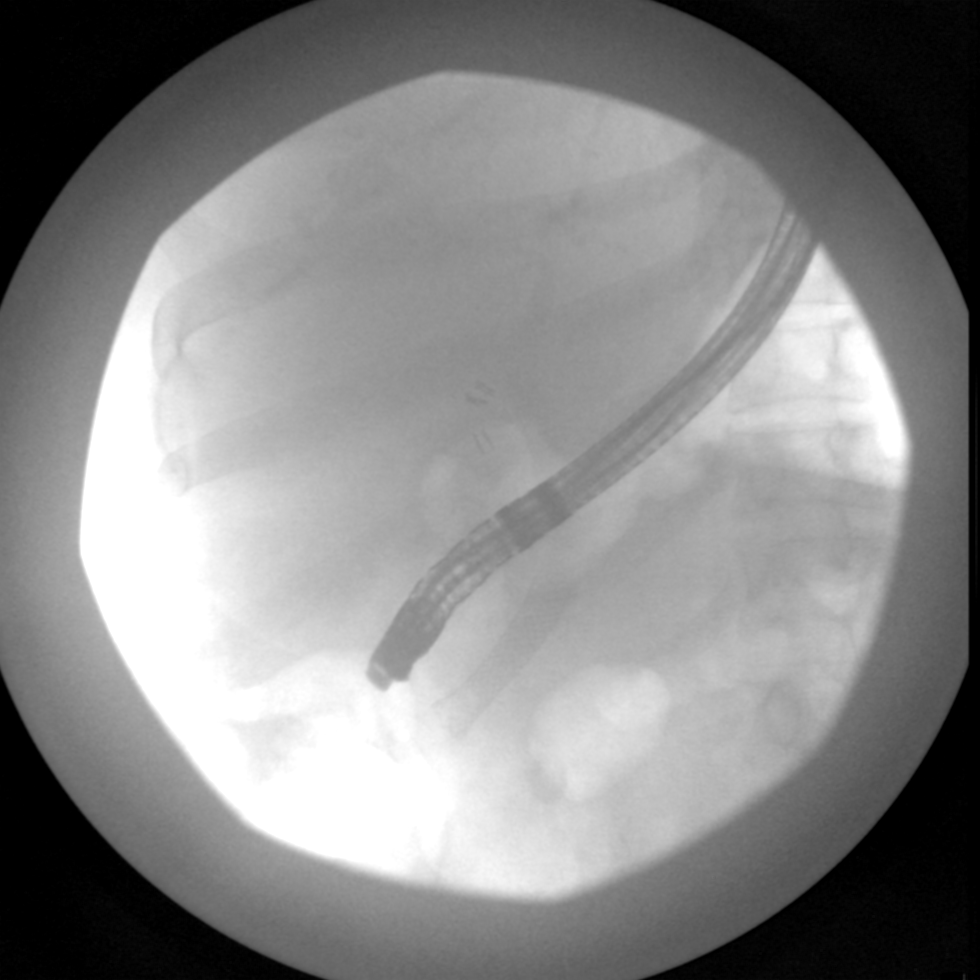

[4 of 4 positions shown; findings below may reference images not displayed]

FINDINGS: Images demonstrate contrast filling of the common bile duct. It is
dilated. A polypoid filling defect is present in the distal common
bile duct. The last image demonstrates a biliary stent within the
distal common bile duct.
IMPRESSION: See above

These images were submitted for radiologic interpretation only.
Please see the procedural report for the amount of contrast and the
fluoroscopy time utilized.

## 2015-03-11 SURGERY — ERCP, WITH INTERVENTION IF INDICATED
Anesthesia: General

## 2015-03-11 MED ORDER — LACTATED RINGERS IV SOLN
INTRAVENOUS | Status: DC
  Administered 2015-03-11 (×3): via INTRAVENOUS

## 2015-03-11 MED ORDER — LIDOCAINE HCL (CARDIAC) 20 MG/ML IV SOLN
INTRAVENOUS | Status: DC | PRN
  Administered 2015-03-11: 100 mg via INTRAVENOUS

## 2015-03-11 MED ORDER — SODIUM CHLORIDE 0.9 % IV SOLN: INTRAVENOUS | Status: DC

## 2015-03-11 MED ORDER — POLYETHYLENE GLYCOL 3350 17 G PO PACK
17.0000 g | PACK | Freq: Every day | ORAL | Status: DC
  Administered 2015-03-11 – 2015-03-16 (×4): 17 g via ORAL
  Filled 2015-03-11 (×5): qty 1

## 2015-03-11 MED ORDER — IOHEXOL 350 MG/ML SOLN
INTRAVENOUS | Status: DC | PRN
  Administered 2015-03-11: 30 mL

## 2015-03-11 MED ORDER — SUGAMMADEX SODIUM 200 MG/2ML IV SOLN
INTRAVENOUS | Status: AC
  Filled 2015-03-11: qty 2

## 2015-03-11 MED ORDER — ROCURONIUM BROMIDE 100 MG/10ML IV SOLN
INTRAVENOUS | Status: DC | PRN
  Administered 2015-03-11: 40 mg via INTRAVENOUS

## 2015-03-11 MED ORDER — INDOMETHACIN 50 MG RE SUPP
100.0000 mg | Freq: Once | RECTAL | Status: AC
  Administered 2015-03-11: 100 mg via RECTAL
  Filled 2015-03-11: qty 2

## 2015-03-11 MED ORDER — FENTANYL CITRATE (PF) 100 MCG/2ML IJ SOLN
INTRAMUSCULAR | Status: DC | PRN
  Administered 2015-03-11 (×4): 50 ug via INTRAVENOUS

## 2015-03-11 MED ORDER — SUGAMMADEX SODIUM 200 MG/2ML IV SOLN
INTRAVENOUS | Status: DC | PRN
  Administered 2015-03-11: 200 mg via INTRAVENOUS

## 2015-03-11 MED ORDER — PROPOFOL 10 MG/ML IV BOLUS
INTRAVENOUS | Status: DC | PRN
  Administered 2015-03-11: 120 mg via INTRAVENOUS

## 2015-03-11 MED ORDER — MIDAZOLAM HCL 5 MG/5ML IJ SOLN
INTRAMUSCULAR | Status: DC | PRN
  Administered 2015-03-11: 2 mg via INTRAVENOUS

## 2015-03-11 NOTE — Op Note (Signed)
[facility] [place], [address]   ERCP PROCEDURE REPORT  PATIENT: Julie Robertson, Julie Robertson  MR#: [id number] BIRTHDATE: July 22, 1975  GENDER: female ENDOSCOPIST: Milus Banister, MD REFERRED BY: Georganna Skeans, M.D. PROCEDURE DATE:  03/11/2015 PROCEDURE:   ERCP with biopsy, ERCP with sphincterotomy/papillotomy, and ERCP with stent placement INDICATIONS:abnormal IOC following lap chole yesterday, dilated CBD, slightly elevated LFTs. MEDICATIONS: Per Anesthesia, indomethacin 100mg  PR, abx IV on floor TOPICAL ANESTHETIC:   none  DESCRIPTION OF PROCEDURE:     Physical exam was performed.  Informed consent was obtained from the patient after explaining the benefits, risks, and alternatives to the procedure.  The patient was connected to the monitor and placed in the semi-prone position. IV medicine was administered through an indwelling cannula and oxygen via endotracheal tube.  After administration of sedation, the patients esophagus was intubated and the Pentax Ercp Scope [id number]  endoscope was advanced under direct visualization to the second portion of the duodenum.  The major papilla was abnormal; protuberent with ? neoplastic mucosa. This was biopsied. A 44 Autotome over a .035 hydrawire was used to cannulate the bile duct and contrast was injected.  Cholangiogram revealed dilated CBD, CHD (CBD 69mm). There were distal CBD filling defects (measruing 2cm total), that appeared somewhat round and mobile, perhaps confluent. An adequate biliary sphincterotomy was performed and then the bile duct was swept with retrieval balloon. 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 This was clearly neoplastic, clearly still viable (presumed to be attatched to the distal CBD).  After extensive probing I was able to relocate the biliary orifice, cannulate the bile duct again and then placed a 38mm diameter, 4cm long fully covered  metal stent across the bile duct, neoplasm and extending into the duodenal lumen about 5-2mm. Following stent placement I took several more biospies of the tumor that remained "prolapsed" within the duodenum lumen.  The main pancreatic duct was never cannulated with wire or injected with dye.  The scope was then completely withdrawn from the patient and the procedure terminated.    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  A 49mm diameter, 4cm long fully covered (removable) metal stent was placed across the distal CBD, neoplasm complex to help ensure biliary flow.  RECOMMENDATIONS: Await final pathology. She will likely need surgical resection (?[place])    _______________________________ eSigned:  Milus Banister, MD 03/11/2015 3:15 PM   PATIENT NAME:  Julie Robertson, Julie Robertson MR#: [id number]

## 2015-03-11 NOTE — Anesthesia Postprocedure Evaluation (Signed)
  Anesthesia Post-op Note  Patient: Julie Robertson  Procedure(s) Performed: Procedure(s): ENDOSCOPIC RETROGRADE CHOLANGIOPANCREATOGRAPHY (ERCP) (N/A)  Patient Location: PACU and Endoscopy Unit  Anesthesia Type:General  Level of Consciousness: awake, sedated and patient cooperative  Airway and Oxygen Therapy: Patient Spontanous Breathing and Patient connected to face mask oxygen  Post-op Pain: none  Post-op Assessment: Post-op Vital signs reviewed, Patient's Cardiovascular Status Stable, Respiratory Function Stable, Patent Airway and No signs of Nausea or vomiting              Post-op Vital Signs: Reviewed and stable  Last Vitals:  Filed Vitals:   03/11/15 1520  BP: 135/64  Pulse: 103  Temp: 36.8 C  Resp: 27    Complications: No apparent anesthesia complications and RN with pt to encourage CDB

## 2015-03-11 NOTE — Anesthesia Procedure Notes (Signed)
Procedure Name: Intubation Date/Time: 03/11/2015 1:38 PM Performed by: Scheryl Darter Pre-anesthesia Checklist: Patient identified, Emergency Drugs available, Suction available, Patient being monitored and Timeout performed Patient Re-evaluated:Patient Re-evaluated prior to inductionOxygen Delivery Method: Circle system utilized Preoxygenation: Pre-oxygenation with 100% oxygen Intubation Type: IV induction Ventilation: Mask ventilation without difficulty Laryngoscope Size: Mac and 3 Grade View: Grade I Tube type: Oral Tube size: 7.5 mm Number of attempts: 1 Airway Equipment and Method: Stylet Placement Confirmation: ETT inserted through vocal cords under direct vision,  positive ETCO2 and breath sounds checked- equal and bilateral Secured at: 22 cm Tube secured with: Tape Dental Injury: Teeth and Oropharynx as per pre-operative assessment

## 2015-03-11 NOTE — Anesthesia Preprocedure Evaluation (Addendum)
Anesthesia Evaluation  Patient identified by MRN, date of birth, ID band Patient awake    Reviewed: Allergy & Precautions, NPO status , Patient's Chart, lab work & pertinent test results  History of Anesthesia Complications Negative for: history of anesthetic complications  Airway Mallampati: II  TM Distance: >3 FB Neck ROM: Full    Dental  (+) Dental Advisory Given, Missing,    Pulmonary Current Smoker,    Pulmonary exam normal breath sounds clear to auscultation       Cardiovascular Exercise Tolerance: Good negative cardio ROS Normal cardiovascular exam Rhythm:Regular Rate:Normal     Neuro/Psych negative neurological ROS     GI/Hepatic negative GI ROS, Neg liver ROS,   Endo/Other  diabetesMorbid obesity  Renal/GU negative Renal ROS     Musculoskeletal negative musculoskeletal ROS (+)   Abdominal   Peds  Hematology  (+) Blood dyscrasia, anemia ,   Anesthesia Other Findings Day of surgery medications reviewed with the patient.  Reproductive/Obstetrics                           Anesthesia Physical Anesthesia Plan  ASA: II  Anesthesia Plan: General   Post-op Pain Management:    Induction: Intravenous  Airway Management Planned: Oral ETT  Additional Equipment:   Intra-op Plan:   Post-operative Plan: Extubation in OR  Informed Consent: I have reviewed the patients History and Physical, chart, labs and discussed the procedure including the risks, benefits and alternatives for the proposed anesthesia with the patient or authorized representative who has indicated his/her understanding and acceptance.   Dental advisory given  Plan Discussed with: CRNA  Anesthesia Plan Comments: (Risks/benefits of general anesthesia discussed with patient including risk of damage to teeth, lips, gum, and tongue, nausea/vomiting, allergic reactions to medications, and the possibility of heart  attack, stroke and death.  All patient questions answered.  Patient wishes to proceed.)        Anesthesia Quick Evaluation

## 2015-03-11 NOTE — H&P (View-Only) (Signed)
[facility] Consult: 2:21 PM 03/10/2015     Referring Provider: Dr Grandville Silos  Primary Care Physician:  Lorayne Marek, MD Primary Gastroenterologist:  unassigned     Reason for Consultation:  Intraooperative cholangiogram this AM with significant dilatation of the intra-and extrahepatic biliary tract with distal common bile duct stones versus sludge. A trickle of contrast into the duodenum    HPI: Julie Robertson is a 39 y.o. female.  She doesn't speak [place] and communication mediated by Electronics engineer. Type 2 IDDM.  Dyslipidemia.  Obesity with BMI 41. LFTs elevated in 2011, ultrasound then: 7 mm "prominent for age" CBD, gallstones. Smoker.   Presented yesterday afternoon to ED with 2 days acute onset n/v, abdominal pain. ? Fevers.  Multiple gallstones and 12 mm CBD on ultrasound. Lipase 21. LFTs elevated with t bil 3.0.  However these declined today and so went to routine lap chole with abnormal IOC as noted above.    Now, a few hours post surgery, the patient is complaining of back pain, she has an oxygen mask on.    Past Medical History  Diagnosis Date  . Diabetes mellitus without complication   . Dyslipidemia 08/2009  . Gall stones 08/2009  . 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  numbness tingling in right fingers. rx with Gabapentin.  . Ingrown toenail 03/2015    right  hallux  . Obesity     BMI 41, 250# 03/2015    Past Surgical History  Procedure Laterality Date  . Tubal ligation      Prior to Admission medications   Medication Sig Start Date End Date Taking? Authorizing Provider  atorvastatin (LIPITOR) 10 MG tablet Take 1 tablet (10 mg total) by mouth daily. Patient taking differently: Take 10 mg by mouth daily at 6 PM.  12/17/14  Yes Deepak Advani, MD  cephALEXin  (KEFLEX) 500 MG capsule Take 1 capsule (500 mg total) by mouth 4 (four) times daily. Patient taking differently: Take 500 mg by mouth 4 (four) times daily. STARTED 03/06/15, FOR 5 DAYS ENDING 03/10/15 03/06/15  Yes Leone Brand, MD  gabapentin (NEURONTIN) 300 MG capsule Take 1 capsule (300 mg total) by mouth 3 (three) times daily. 12/17/14  Yes Lorayne Marek, MD  ibuprofen (ADVIL,MOTRIN) 200 MG tablet Take 400 mg by mouth every 6 (six) hours as needed for moderate pain.   Yes Historical Provider, MD  insulin NPH-regular Human (HUMULIN 70/30) (70-30) 100 UNIT/ML injection INJECT 17 UNITS UNDER THE SKIN 2 TIMES DAILY Patient taking differently: Inject 18 Units into the skin 2 (two) times daily with a meal.  12/17/14  Yes Lorayne Marek, MD  metFORMIN (GLUCOPHAGE) 1000 MG tablet Take 1 tablet (1,000 mg total) by mouth 2 (two) times daily with a meal. 12/17/14  Yes Deepak Advani, MD  albuterol (PROVENTIL HFA;VENTOLIN HFA) 108 (90 BASE) MCG/ACT inhaler Inhale 2 puffs into the lungs every 6 (six) hours as needed for wheezing or shortness of breath. 12/17/14   Lorayne Marek, MD  azithromycin (ZITHROMAX Z-PAK) 250 MG tablet Bronchitis Patient  not taking: Reported on 10/17/2014 10/02/14   Brayton Caves, PA-C  glucose monitoring kit (FREESTYLE) monitoring kit 1 each by Does not apply route 4 (four) times daily - after meals and at bedtime. 1 month Diabetic Testing Supplies for QAC-QHS accuchecks. 07/03/13   Lorayne Marek, MD  guaiFENesin-codeine 100-10 MG/5ML syrup Take 5 mLs by mouth every 4 (four) hours as needed for cough. Patient not taking: Reported on 10/17/2014 10/02/14   Brayton Caves, PA-C  Insulin Pen Needle 31G X 5 MM MISC Inject 10 units subcutaneous at bedtime 07/03/13   Lorayne Marek, MD  magnesium hydroxide (MILK OF MAGNESIA) 400 MG/5ML suspension Take 5 mLs by mouth daily as needed for mild constipation. Patient not taking: Reported on 10/02/2014 07/03/13   Lorayne Marek, MD  nicotine (NICODERM CQ - DOSED  IN MG/24 HOURS) 14 mg/24hr patch Place 1 patch (14 mg total) onto the skin daily. Patient not taking: Reported on 10/02/2014 07/03/13   Lorayne Marek, MD    Scheduled Meds: . antiseptic oral rinse  7 mL Mouth Rinse q12n4p  . cefTRIAXone (ROCEPHIN)  IV  2 g Intravenous Q24H  . chlorhexidine  15 mL Mouth Rinse BID  . HYDROmorphone      . HYDROmorphone      . insulin aspart  0-20 Units Subcutaneous TID WC  . mupirocin ointment   Nasal BID   Infusions: . sodium chloride 125 mL/hr at 03/09/15 2120   PRN Meds: diphenhydrAMINE **OR** diphenhydrAMINE, hydrALAZINE, HYDROmorphone (DILAUDID) injection, ondansetron **OR** ondansetron (ZOFRAN) IV, oxyCODONE-acetaminophen   Allergies as of 03/09/2015  . (No Known Allergies)    Family History  Problem Relation Age of Onset  . Diabetes Mother   . Heart disease Mother   . Cancer Mother   . Cancer Father   . Diabetes Father   . Heart disease Father     Social History   Social History  . Marital Status: Legally Separated    Spouse Name: N/A  . Number of Children: N/A  . Years of Education: N/A   Occupational History  . Not on file.   Social History Main Topics  . Smoking status: Current Every Day Smoker -- 1.50 packs/day for 4 years    Types: Cigarettes  . Smokeless tobacco: Not on file  . Alcohol Use: Yes     Comment: socially  . Drug Use: No  . Sexual Activity: Not on file   Other Topics Concern  . Not on file   Social History Narrative   Patient has 2 sons one is 38, the other 21 as of 03/2015. Her kids are not the children of her current husband. As of 03/2015 she is not employed outside the home.    REVIEW OF SYSTEMS: Constitutional:  Generally no issues with weakness or mobility. Her weight is down 12# compared with 4-1/2 months ago. ENT:  No nose bleeds Pulm:  Her abdominal/back pain is worse with deep breathing. No cough. CV:  No palpitations, no LE edema. No chest pain. GU:  No hematuria, no frequency.  No  tea-colored urine GI:  Per HPI.  Generally patient doesn't have a lot of GI complaints. She eats well. Heme:  No issues with excessive bleeding or bruising.   Transfusions:  None in past. Neuro:  No headaches, no peripheral tingling or numbness Derm:  No itching, no rash or sores.  Endocrine:  No sweats or chills.  No polyuria or dysuria Immunization:  She received her flu and pneumococcal vaccination 09/2014.  Travel:  None beyond local counties in last few months.    PHYSICAL EXAM: Vital signs in last 24 hours: Filed Vitals:   03/10/15 1409  BP: 150/85  Pulse: 107  Temp: 98.5 F (36.9 C)  Resp: 24   Wt Readings from Last 3 Encounters:  03/09/15 250 lb 7.1 oz (113.6 kg)  12/17/14 265 lb (120.203 kg)  10/02/14 262 lb (118.842 kg)    General: Obese, uncomfortable Hispanic female.  She is drowsy postop. Head:  No asymmetry. Face is slightly cushingoid versus just obese.  Eyes:  No scleral icterus, no conjunctival pallor. Ears:  Not hard of hearing.  Nose:  No congestion or discharge. Oxygen facemask  in place. Mouth:  Clear, moist. Fair dentition. Neck:  No mass, no JVD. Lungs:  Clear bilaterally. Overall reduced breath sounds with shallow breathing and slight tachypnea. Heart: RRR. No MRG. S1/S2 audible. Abdomen:  Soft, ND.  Obese.  Minimal bruising at umbilicus incision. No fresh blood at this or other surgical incisions. Generally tender but worse on the right side. Bowel sounds hypoactive..   Rectal: Deferred   Musc/Skeltl: No gross joint deformity, erythema or swelling. Extremities:  No CCE. Feet warm.  Neurologic:  Unable to assess orientation as she is still drowsy from surgery. Affect is appropriate. Follows commands. Skin:  No jaundice Tattoos:  None seen Nodes:  No cervical or inguinal adenopathy.   Psych:  Somewhat sedated and calm.  Intake/Output from previous day: 09/05 0701 - 09/06 0700 In: 957 [I.V.:907; IV Piggyback:50] Out: -  Intake/Output this  shift: Total I/O In: 2050 [I.V.:2050] Out: 25 [Blood:25]  LAB RESULTS:  Recent Labs  03/09/15 1356 03/10/15 0528  WBC 8.3 6.7  HGB 10.4* 9.8*  HCT 36.1 33.5*  PLT 376 332   BMET Lab Results  Component Value Date   NA 137 03/10/2015   NA 135 03/09/2015   NA 138 12/17/2014   K 3.8 03/10/2015   K 4.0 03/09/2015   K 4.4 12/17/2014   CL 106 03/10/2015   CL 105 03/09/2015   CL 106 12/17/2014   CO2 19* 03/10/2015   CO2 21* 03/09/2015   CO2 24 12/17/2014   GLUCOSE 189* 03/10/2015   GLUCOSE 263* 03/09/2015   GLUCOSE 125* 12/17/2014   BUN 6 03/10/2015   BUN 9 03/09/2015   BUN 14 12/17/2014   CREATININE 0.69 03/10/2015   CREATININE 0.77 03/09/2015   CREATININE 0.84 12/17/2014   CALCIUM 8.4* 03/10/2015   CALCIUM 9.0 03/09/2015   CALCIUM 8.4 12/17/2014   LFT  Recent Labs  03/09/15 1356 03/10/15 0528  PROT 7.0 6.7  ALBUMIN 3.2* 2.9*  AST 57* 50*  ALT 135* 102*  ALKPHOS 223* 188*  BILITOT 3.0* 2.3*   PT/INR No results found for: INR, PROTIME Hepatitis Panel No results for input(s): HEPBSAG, HCVAB, HEPAIGM, HEPBIGM in the last 72 hours. Lipase     Component Value Date/Time   LIPASE 21* 03/10/2015 0528    Drugs of Abuse  No results found for: LABOPIA, COCAINSCRNUR, LABBENZ, AMPHETMU, THCU, LABBARB   RADIOLOGY STUDIES: Dg Cholangiogram Operative  03/10/2015   CLINICAL DATA:  Cholelithiasis  EXAM: INTRAOPERATIVE CHOLANGIOGRAM  TECHNIQUE: Cholangiographic images from the C-arm fluoroscopic device were submitted for interpretation post-operatively. Please see the procedural report for the amount of contrast and the fluoroscopy time utilized.  COMPARISON:  Ultrasound 03/09/2015  FINDINGS: Partially occlusive intraluminal filling defects versus mucosal irregularity in the distal CBD. Contrast does flow into the duodenum. The intrahepatic bile ducts  are incompletely opacified, mildly distended centrally. No leak from the cystic duct.  IMPRESSION: 1. Partially occlusive  filling defects versus mucosal irregularity in the distal CBD. Consider ERCP for further characterization.   Electronically Signed   By: Lucrezia Europe M.D.   On: 03/10/2015 11:09   US Abdomen Limited Ruq  03/09/2015   CLINICAL DATA:  Right upper quadrant pain. Nausea and vomiting for 3 days.  EXAM: US ABDOMEN LIMITED - RIGHT UPPER QUADRANT  COMPARISON:  None.  FINDINGS: Gallbladder:  Cholelithiasis without pericholecystic fluid or gallbladder wall thickening. Negative sonographic Murphy sign.  Common bile duct:  Diameter: 12 mm  Liver:  No focal lesion identified. Within normal limits in parenchymal echogenicity.  IMPRESSION: 1. Cholelithiasis without sonographic evidence of acute cholecystitis. Dilated common bile duct measuring 12 mm without an obstructing lesion or choledocholithiasis. If there is continue clinical concern, further evaluation with MRCP is recommended.   Electronically Signed   By: Kathreen Devoid   On: 03/09/2015 19:07    ENDOSCOPIC STUDIES: None   IMPRESSION:   *  Abnormal IOC, likely choledocholithiasis  *  Acute cholecystitis.  S/p lap chole  *  Type 2 IDDM.  Historically lax control.      PLAN:     *  ERCP set up for tomorrow at 12:15. Through the phone based translator, case discussed with patient but more so with her husband. This includes risks of infection, pancreatitis, perforation, lack of success which might lead to stent placement. Patient's husband is agreeable to proceed. All all questions answered.  As she is on daily Rocephin, will not add additional preoperative anti-biotic.   Julie Robertson  03/10/2015, 2:21 PM Pager: [phone]     ________________________________________________________________________  Rudolpho Sevin MD note:  I personally examined the patient, reviewed the data and agree with the assessment and plan described above.  Planning for ERCP tomorrow.   Owens Loffler, MD [facility] Pager [phone]

## 2015-03-11 NOTE — Progress Notes (Signed)
Patient ID: Julie Robertson, female   DOB: 1976/01/31, 39 y.o.   MRN: [id number] 1 Day Post-Op  Subjective: Pt worried about her diabetes and her home medications.  Still having quite of bit of epigastric and back pain.  No BM since Sunday  Objective: Vital signs in last 24 hours: Temp:  [98.2 F (36.8 C)-99.7 F (37.6 C)] 99.7 F (37.6 C) (09/07 0443) Pulse Rate:  [74-109] 89 (09/07 0443) Resp:  [18-33] 19 (09/07 0443) BP: (112-150)/(62-85) 115/62 mmHg (09/07 0443) SpO2:  [86 %-97 %] 92 % (09/07 0443)    Intake/Output from previous day: 09/06 0701 - 09/07 0700 In: 4353 [P.O.:480; I.V.:3823; IV Piggyback:50] Out: 25 [Blood:25] Intake/Output this shift:    PE: Abd: soft, appropriately tender, but still greatest in epigastrium, +BS, obese, incisions c/d/i  Lab Results:   Recent Labs  03/09/15 1356 03/10/15 0528  WBC 8.3 6.7  HGB 10.4* 9.8*  HCT 36.1 33.5*  PLT 376 332   BMET  Recent Labs  03/09/15 1356 03/10/15 0528  NA 135 137  K 4.0 3.8  CL 105 106  CO2 21* 19*  GLUCOSE 263* 189*  BUN 9 6  CREATININE 0.77 0.69  CALCIUM 9.0 8.4*   PT/INR No results for input(s): LABPROT, INR in the last 72 hours. CMP     Component Value Date/Time   NA 137 03/10/2015 0528   K 3.8 03/10/2015 0528   CL 106 03/10/2015 0528   CO2 19* 03/10/2015 0528   GLUCOSE 189* 03/10/2015 0528   BUN 6 03/10/2015 0528   CREATININE 0.69 03/10/2015 0528   CREATININE 0.84 12/17/2014 1749   CALCIUM 8.4* 03/10/2015 0528   PROT 6.7 03/10/2015 0528   ALBUMIN 2.9* 03/10/2015 0528   AST 50* 03/10/2015 0528   ALT 102* 03/10/2015 0528   ALKPHOS 188* 03/10/2015 0528   BILITOT 2.3* 03/10/2015 0528   GFRNONAA >60 03/10/2015 0528   GFRNONAA 87 12/17/2014 1749   GFRAA >60 03/10/2015 0528   GFRAA >89 12/17/2014 1749   Lipase     Component Value Date/Time   LIPASE 21* 03/10/2015 0528       Studies/Results: Dg Cholangiogram Operative  03/10/2015   CLINICAL DATA:  Cholelithiasis   EXAM: INTRAOPERATIVE CHOLANGIOGRAM  TECHNIQUE: Cholangiographic images from the C-arm fluoroscopic device were submitted for interpretation post-operatively. Please see the procedural report for the amount of contrast and the fluoroscopy time utilized.  COMPARISON:  Ultrasound 03/09/2015  FINDINGS: Partially occlusive intraluminal filling defects versus mucosal irregularity in the distal CBD. Contrast does flow into the duodenum. The intrahepatic bile ducts are incompletely opacified, mildly distended centrally. No leak from the cystic duct.  IMPRESSION: 1. Partially occlusive filling defects versus mucosal irregularity in the distal CBD. Consider ERCP for further characterization.   Electronically Signed   By: Lucrezia Europe M.D.   On: 03/10/2015 11:09   US Abdomen Limited Ruq  03/09/2015   CLINICAL DATA:  Right upper quadrant pain. Nausea and vomiting for 3 days.  EXAM: US ABDOMEN LIMITED - RIGHT UPPER QUADRANT  COMPARISON:  None.  FINDINGS: Gallbladder:  Cholelithiasis without pericholecystic fluid or gallbladder wall thickening. Negative sonographic Murphy sign.  Common bile duct:  Diameter: 12 mm  Liver:  No focal lesion identified. Within normal limits in parenchymal echogenicity.  IMPRESSION: 1. Cholelithiasis without sonographic evidence of acute cholecystitis. Dilated common bile duct measuring 12 mm without an obstructing lesion or choledocholithiasis. If there is continue clinical concern, further evaluation with MRCP is recommended.   Electronically  Signed   By: Kathreen Devoid   On: 03/09/2015 19:07    Anti-infectives: Anti-infectives    Start     Dose/Rate Route Frequency Ordered Stop   03/09/15 2045  cefTRIAXone (ROCEPHIN) 2 g in dextrose 5 % 50 mL IVPB     2 g 100 mL/hr over 30 Minutes Intravenous Every 24 hours 03/09/15 2037         Assessment/Plan  POD 1, s/p lap chole with +IOC -for ERCP today per GI -recheck labs in am -NPO for now, but can have clears and diet advancement as  tolerates after ERCP if ok with GI -add stool softener -Rocephin D 2, will cont while inpatient and stop at DC DM -cont SSI for now DVT -SCD/ no chemical prophylaxis due to procedure  LOS: 1 day    Julie Robertson E 03/11/2015, 8:41 AM Pager: [phone]

## 2015-03-11 NOTE — Transfer of Care (Signed)
Immediate Anesthesia Transfer of Care Note  Patient: Julie Robertson  Procedure(s) Performed: Procedure(s): ENDOSCOPIC RETROGRADE CHOLANGIOPANCREATOGRAPHY (ERCP) (N/A)  Patient Location: Endoscopy Unit  Anesthesia Type:General  Level of Consciousness: awake, sedated and patient cooperative  Airway & Oxygen Therapy: Patient Spontanous Breathing and Patient connected to face mask oxygen  Post-op Assessment: Report given to RN and Post -op Vital signs reviewed and stable  Post vital signs: Reviewed and stable  Last Vitals:  Filed Vitals:   03/11/15 1520  BP: 135/64  Pulse: 103  Temp: 36.8 C  Resp: 27    Complications: No apparent anesthesia complications and Pillow under pt's upper back and HOB up 40 degrees to maximize breathing excursions

## 2015-03-11 NOTE — Interval H&P Note (Signed)
History and Physical Interval Note:  03/11/2015 1:01 PM  Julie Robertson  has presented today for surgery, with the diagnosis of Filling defect on intraoperative cholangiogram 03/10/15  The various methods of treatment have been discussed with the patient and family. After consideration of risks, benefits and other options for treatment, the patient has consented to  Procedure(s): ENDOSCOPIC RETROGRADE CHOLANGIOPANCREATOGRAPHY (ERCP) (N/A) as a surgical intervention .  The patient's history has been reviewed, patient examined, no change in status, stable for surgery.  I have reviewed the patient's chart and labs.  Questions were answered to the patient's satisfaction.     Milus Banister

## 2015-03-11 NOTE — Anesthesia Postprocedure Evaluation (Signed)
  Anesthesia Post-op Note  Patient: Julie Robertson  Procedure(s) Performed: Procedure(s): LAPAROSCOPIC CHOLECYSTECTOMY WITH INTRAOPERATIVE CHOLANGIOGRAM (N/A)  Patient Location: PACU  Anesthesia Type:General  Level of Consciousness: awake  Airway and Oxygen Therapy: Patient Spontanous Breathing  Post-op Pain: mild  Post-op Assessment: Post-op Vital signs reviewed              Post-op Vital Signs: Reviewed  Last Vitals:  Filed Vitals:   03/11/15 1152  BP: 108/56  Pulse: 92  Temp: 37.7 C  Resp: 19    Complications: No apparent anesthesia complications

## 2015-03-12 ENCOUNTER — Encounter (HOSPITAL_COMMUNITY): Payer: Self-pay | Admitting: Gastroenterology

## 2015-03-12 DIAGNOSIS — J811 Chronic pulmonary edema: Secondary | ICD-10-CM | POA: Diagnosis not present

## 2015-03-12 DIAGNOSIS — E1140 Type 2 diabetes mellitus with diabetic neuropathy, unspecified: Secondary | ICD-10-CM | POA: Diagnosis present

## 2015-03-12 DIAGNOSIS — D649 Anemia, unspecified: Secondary | ICD-10-CM | POA: Diagnosis present

## 2015-03-12 DIAGNOSIS — K5900 Constipation, unspecified: Secondary | ICD-10-CM | POA: Diagnosis present

## 2015-03-12 DIAGNOSIS — K8062 Calculus of gallbladder and bile duct with acute cholecystitis without obstruction: Secondary | ICD-10-CM | POA: Diagnosis present

## 2015-03-12 DIAGNOSIS — Z6841 Body Mass Index (BMI) 40.0 and over, adult: Secondary | ICD-10-CM | POA: Diagnosis not present

## 2015-03-12 DIAGNOSIS — J45909 Unspecified asthma, uncomplicated: Secondary | ICD-10-CM | POA: Diagnosis present

## 2015-03-12 DIAGNOSIS — R1011 Right upper quadrant pain: Secondary | ICD-10-CM | POA: Diagnosis present

## 2015-03-12 DIAGNOSIS — Z79899 Other long term (current) drug therapy: Secondary | ICD-10-CM | POA: Diagnosis not present

## 2015-03-12 DIAGNOSIS — Z794 Long term (current) use of insulin: Secondary | ICD-10-CM | POA: Diagnosis not present

## 2015-03-12 DIAGNOSIS — G629 Polyneuropathy, unspecified: Secondary | ICD-10-CM | POA: Diagnosis present

## 2015-03-12 DIAGNOSIS — D490 Neoplasm of unspecified behavior of digestive system: Secondary | ICD-10-CM | POA: Diagnosis present

## 2015-03-12 DIAGNOSIS — F17210 Nicotine dependence, cigarettes, uncomplicated: Secondary | ICD-10-CM | POA: Diagnosis present

## 2015-03-12 DIAGNOSIS — J9811 Atelectasis: Secondary | ICD-10-CM | POA: Diagnosis not present

## 2015-03-12 DIAGNOSIS — E785 Hyperlipidemia, unspecified: Secondary | ICD-10-CM | POA: Diagnosis present

## 2015-03-12 LAB — GLUCOSE, CAPILLARY
GLUCOSE-CAPILLARY: 202 mg/dL — AB (ref 65–99)
Glucose-Capillary: 255 mg/dL — ABNORMAL HIGH (ref 65–99)
Glucose-Capillary: 251 mg/dL — ABNORMAL HIGH (ref 65–99)
Glucose-Capillary: 175 mg/dL — ABNORMAL HIGH (ref 65–99)

## 2015-03-12 LAB — COMPREHENSIVE METABOLIC PANEL
ALBUMIN: 2.3 g/dL — AB (ref 3.5–5.0)
ALK PHOS: 147 U/L — AB (ref 38–126)
ALT: 68 U/L — ABNORMAL HIGH (ref 14–54)
AST: 40 U/L (ref 15–41)
Anion gap: 8 (ref 5–15)
BILIRUBIN TOTAL: 1.5 mg/dL — AB (ref 0.3–1.2)
CALCIUM: 7.9 mg/dL — AB (ref 8.9–10.3)
CO2: 26 mmol/L (ref 22–32)
CREATININE: 0.71 mg/dL (ref 0.44–1.00)
Chloride: 99 mmol/L — ABNORMAL LOW (ref 101–111)
GFR calc Af Amer: >60 mL/min (ref >60)
GFR calc non Af Amer: >60 mL/min (ref >60)
GLUCOSE: 284 mg/dL — AB (ref 65–99)
Potassium: 3.9 mmol/L (ref 3.5–5.1)
Sodium: 133 mmol/L — ABNORMAL LOW (ref 135–145)
TOTAL PROTEIN: 6 g/dL — AB (ref 6.5–8.1)

## 2015-03-12 LAB — LIPASE, BLOOD: Lipase: 13 U/L — ABNORMAL LOW (ref 22–51)

## 2015-03-12 MED ORDER — LORAZEPAM 2 MG/ML IJ SOLN
0.5000 mg | INTRAMUSCULAR | Status: DC | PRN
Start: 2015-03-12 — End: 2015-03-16

## 2015-03-12 MED ORDER — KETOROLAC TROMETHAMINE 30 MG/ML IJ SOLN
30.0000 mg | Freq: Three times a day (TID) | INTRAMUSCULAR | Status: AC
  Administered 2015-03-12 – 2015-03-15 (×8): 30 mg via INTRAVENOUS
  Filled 2015-03-12 (×7): qty 1

## 2015-03-12 MED ORDER — ENOXAPARIN SODIUM 40 MG/0.4ML ~~LOC~~ SOLN
40.0000 mg | SUBCUTANEOUS | Status: DC
  Administered 2015-03-12 – 2015-03-15 (×4): 40 mg via SUBCUTANEOUS
  Filled 2015-03-12 (×4): qty 0.4

## 2015-03-12 MED ORDER — TRAMADOL HCL 50 MG PO TABS
50.0000 mg | ORAL_TABLET | Freq: Four times a day (QID) | ORAL | Status: DC | PRN
  Administered 2015-03-15: 50 mg via ORAL
  Filled 2015-03-12: qty 1

## 2015-03-12 MED ORDER — GABAPENTIN 300 MG PO CAPS
300.0000 mg | ORAL_CAPSULE | Freq: Three times a day (TID) | ORAL | Status: DC
  Administered 2015-03-12 – 2015-03-16 (×11): 300 mg via ORAL
  Filled 2015-03-12 (×11): qty 1

## 2015-03-12 MED ORDER — INSULIN ASPART PROT & ASPART (70-30 MIX) 100 UNIT/ML ~~LOC~~ SUSP
10.0000 [IU] | Freq: Two times a day (BID) | SUBCUTANEOUS | Status: DC
  Administered 2015-03-12 – 2015-03-13 (×2): 10 [IU] via SUBCUTANEOUS
  Filled 2015-03-12: qty 10

## 2015-03-12 NOTE — Progress Notes (Signed)
[facility] Progress Note    Since last GI note: ERCP yesterday, see full report in chart.  Biopsies from the ampulla, distal bile duct mass are still pending.  I visited without interpreter but could understand she ate minimal breakfast, c/o diffuse abd pains.  Objective: Vital signs in last 24 hours: Temp:  [98.1 F (36.7 C)-100 F (37.8 C)] 98.1 F (36.7 C) (09/08 0434) Pulse Rate:  [92-105] 105 (09/08 0434) Resp:  [19-32] 20 (09/08 0434) BP: (108-157)/(43-114) 119/67 mmHg (09/08 0434) SpO2:  [90 %-96 %] 92 % (09/08 0434) Last BM Date: 03/08/15 General: alert and oriented times 3 Heart: regular rate and rythm Abdomen: soft, very mildly tender throughout, non-distended, normal bowel sounds   Lab Results:  Recent Labs  03/09/15 1356 03/10/15 0528  WBC 8.3 6.7  HGB 10.4* 9.8*  PLT 376 332  MCV 70.1* 71.0*    Recent Labs  03/09/15 1356 03/10/15 0528 03/12/15 0455  NA 135 137 133*  K 4.0 3.8 3.9  CL 105 106 99*  CO2 21* 19* 26  GLUCOSE 263* 189* 284*  BUN 9 6 <5*  CREATININE 0.77 0.69 0.71  CALCIUM 9.0 8.4* 7.9*    Recent Labs  03/09/15 1356 03/10/15 0528 03/12/15 0455  PROT 7.0 6.7 6.0*  ALBUMIN 3.2* 2.9* 2.3*  AST 57* 50* 40  ALT 135* 102* 68*  ALKPHOS 223* 188* 147*  BILITOT 3.0* 2.3* 1.5*   No results for input(s): INR in the last 72 hours.   Studies/Results: Dg Cholangiogram Operative  03/10/2015   CLINICAL DATA:  Cholelithiasis  EXAM: INTRAOPERATIVE CHOLANGIOGRAM  TECHNIQUE: Cholangiographic images from the C-arm fluoroscopic device were submitted for interpretation post-operatively. Please see the procedural report for the amount of contrast and the fluoroscopy time utilized.  COMPARISON:  Ultrasound 03/09/2015  FINDINGS: Partially occlusive intraluminal filling defects versus mucosal irregularity in the distal CBD. Contrast does flow into the duodenum. The intrahepatic bile ducts are incompletely opacified, mildly distended  centrally. No leak from the cystic duct.  IMPRESSION: 1. Partially occlusive filling defects versus mucosal irregularity in the distal CBD. Consider ERCP for further characterization.   Electronically Signed   By: Lucrezia Europe M.D.   On: 03/10/2015 11:09   Dg Ercp Biliary & Pancreatic Ducts  03/11/2015   CLINICAL DATA:  Stent  EXAM: ERCP  TECHNIQUE: Multiple spot images obtained with the fluoroscopic device and submitted for interpretation post-procedure.  FLUOROSCOPY TIME:  Radiation Exposure Index (as provided by the fluoroscopic device): See below  If the device does not provide the exposure index:  Fluoroscopy Time:  3 minutes and 10 seconds  Number of Acquired Images:  4  COMPARISON:  None.  FINDINGS: Images demonstrate contrast filling of the common bile duct. It is dilated. A polypoid filling defect is present in the distal common bile duct. The last image demonstrates a biliary stent within the distal common bile duct.  IMPRESSION: See above  These images were submitted for radiologic interpretation only. Please see the procedural report for the amount of contrast and the fluoroscopy time utilized.   Electronically Signed   By: Marybelle Killings M.D.   On: 03/11/2015 16:20     Medications: Scheduled Meds: . antiseptic oral rinse  7 mL Mouth Rinse q12n4p  . cefTRIAXone (ROCEPHIN)  IV  2 g Intravenous Q24H  . chlorhexidine  15 mL Mouth Rinse BID  . gabapentin  300 mg Oral TID  . insulin aspart  0-20 Units Subcutaneous TID WC  . ketorolac  30 mg Intravenous 3 times per day  . mupirocin ointment   Nasal BID  . polyethylene glycol  17 g Oral Daily   Continuous Infusions: . sodium chloride 125 mL/hr at 03/12/15 0043   PRN Meds:.diphenhydrAMINE **OR** diphenhydrAMINE, hydrALAZINE, HYDROmorphone (DILAUDID) injection, LORazepam, ondansetron **OR** ondansetron (ZOFRAN) IV, oxyCODONE-acetaminophen, phenol, traMADol    Assessment/Plan: 39 y.o. female POD 2 lap chole, POD 1 ERCP; ampullary/distal CBD  fleshy mass  Agree with surgery about checking lipase today for ? Post ERCP pancreatitis. MRI imaging ordered.   Await pathology from the mass noted yesterday. She will likely require resection (?Whipple) pending imaging results.  LFTs improved, either the stent is helping or the sphincterotomy and prolapsing of the mass is allowing biliary drainage.      Milus Banister, MD  03/12/2015, 10:46 AM [facility] Pager [phone]

## 2015-03-12 NOTE — Progress Notes (Signed)
Interpreter Lesle Chris for [employer]

## 2015-03-12 NOTE — Anesthesia Postprocedure Evaluation (Signed)
Anesthesia Post Note  Patient: Julie Robertson  Procedure(s) Performed: Procedure(s) (LRB): ENDOSCOPIC RETROGRADE CHOLANGIOPANCREATOGRAPHY (ERCP) (N/A)  Anesthesia type: general  Patient location: PACU  Post pain: Pain level controlled  Post assessment: Patient's Cardiovascular Status Stable  Last Vitals:  Filed Vitals:   03/12/15 1114  BP:   Pulse: 112  Temp: 36.9 C  Resp: 22    Post vital signs: Reviewed and stable  Level of consciousness: sedated  Complications: No apparent anesthesia complications

## 2015-03-12 NOTE — Progress Notes (Signed)
Patient ID: Julie Robertson, female   DOB: [birth year], 39 y.o.   MRN: [id number] 1 Day Post-Op  Subjective: Pt c/o more pain today along with worsening nausea.  A physical interpretor was used to this morning for our conversation.  The patient had multiple complaints about staffing issues overnight.    Objective: Vital signs in last 24 hours: Temp:  [98.1 F (36.7 C)-100 F (37.8 C)] 98.1 F (36.7 C) (09/08 0434) Pulse Rate:  [92-105] 105 (09/08 0434) Resp:  [19-32] 20 (09/08 0434) BP: (108-157)/(43-114) 119/67 mmHg (09/08 0434) SpO2:  [90 %-96 %] 92 % (09/08 0434) Last BM Date: 03/08/15  Intake/Output from previous day: 09/07 0701 - 09/08 0700 In: 4447.5 [P.O.:360; I.V.:4087.5] Out: -  Intake/Output this shift: Total I/O In: 150 [P.O.:150] Out: -   PE: Abd: soft, tender throughout the upper portion of her abdomen, incisions c/d/i, hypoactive BS, morbidly obese Heart: regular, but slightly tachy Lungs: CTAB  Lab Results:   Recent Labs  03/09/15 1356 03/10/15 0528  WBC 8.3 6.7  HGB 10.4* 9.8*  HCT 36.1 33.5*  PLT 376 332   BMET  Recent Labs  03/10/15 0528 03/12/15 0455  NA 137 133*  K 3.8 3.9  CL 106 99*  CO2 19* 26  GLUCOSE 189* 284*  BUN 6 <5*  CREATININE 0.69 0.71  CALCIUM 8.4* 7.9*   PT/INR No results for input(s): LABPROT, INR in the last 72 hours. CMP     Component Value Date/Time   NA 133* 03/12/2015 0455   K 3.9 03/12/2015 0455   CL 99* 03/12/2015 0455   CO2 26 03/12/2015 0455   GLUCOSE 284* 03/12/2015 0455   BUN <5* 03/12/2015 0455   CREATININE 0.71 03/12/2015 0455   CREATININE 0.84 12/17/2014 1749   CALCIUM 7.9* 03/12/2015 0455   PROT 6.0* 03/12/2015 0455   ALBUMIN 2.3* 03/12/2015 0455   AST 40 03/12/2015 0455   ALT 68* 03/12/2015 0455   ALKPHOS 147* 03/12/2015 0455   BILITOT 1.5* 03/12/2015 0455   GFRNONAA >60 03/12/2015 0455   GFRNONAA 87 12/17/2014 1749   GFRAA >60 03/12/2015 0455   GFRAA >89 12/17/2014 1749    Lipase     Component Value Date/Time   LIPASE 21* 03/10/2015 0528       Studies/Results: Dg Cholangiogram Operative  03/10/2015   CLINICAL DATA:  Cholelithiasis  EXAM: INTRAOPERATIVE CHOLANGIOGRAM  TECHNIQUE: Cholangiographic images from the C-arm fluoroscopic device were submitted for interpretation post-operatively. Please see the procedural report for the amount of contrast and the fluoroscopy time utilized.  COMPARISON:  Ultrasound 03/09/2015  FINDINGS: Partially occlusive intraluminal filling defects versus mucosal irregularity in the distal CBD. Contrast does flow into the duodenum. The intrahepatic bile ducts are incompletely opacified, mildly distended centrally. No leak from the cystic duct.  IMPRESSION: 1. Partially occlusive filling defects versus mucosal irregularity in the distal CBD. Consider ERCP for further characterization.   Electronically Signed   By: Lucrezia Europe M.D.   On: 03/10/2015 11:09   Dg Ercp Biliary & Pancreatic Ducts  03/11/2015   CLINICAL DATA:  Stent  EXAM: ERCP  TECHNIQUE: Multiple spot images obtained with the fluoroscopic device and submitted for interpretation post-procedure.  FLUOROSCOPY TIME:  Radiation Exposure Index (as provided by the fluoroscopic device): See below  If the device does not provide the exposure index:  Fluoroscopy Time:  3 minutes and 10 seconds  Number of Acquired Images:  4  COMPARISON:  None.  FINDINGS: Images demonstrate contrast filling of the  common bile duct. It is dilated. A polypoid filling defect is present in the distal common bile duct. The last image demonstrates a biliary stent within the distal common bile duct.  IMPRESSION: See above  These images were submitted for radiologic interpretation only. Please see the procedural report for the amount of contrast and the fluoroscopy time utilized.   Electronically Signed   By: Marybelle Killings M.D.   On: 03/11/2015 16:20    Anti-infectives: Anti-infectives    Start     Dose/Rate Route  Frequency Ordered Stop   03/09/15 2045  cefTRIAXone (ROCEPHIN) 2 g in dextrose 5 % 50 mL IVPB     2 g 100 mL/hr over 30 Minutes Intravenous Every 24 hours 03/09/15 2037         Assessment/Plan  POD 2, s/p lap chole  POD 1, s/p ERCP with stent placement CBD mass -The patient is complaining of more pain and nausea today than the past several days.  I will check a lipase just to rule out post ERCP pancreatitis. -also c/o being dizzy.  Will check orthostatics.  The patient is taking a fair amount of dilaudid though and it's possible that this may be contributing to her unsteadiness on her feet.  I have added tramadol to her percocet as well as toradol.  She also seems quite anxious and so I have added a little ativan prn as well. -With an interpretor present in her room, I have explained what was found yesterday during her ERCP.  I'm not sure the patient truly has a good understanding of what is actually going on.  She seemed more concerned with her complaints about the night staff last night.  I have asked the unit director to speak with her about these issues. -NPO as an MR abd w/wo contrast with MRCP has been recommended to further evaluate this tumor by Dr. Stark Klein.  I have ordered this for today.   -await pathology on biopsies taken yesterday -stent likely working well as her LFTS are trending down nicely. -Rocephin D3, likely does not need many more days of this. DM -on resistant SSI -CBGs still high.  Not eating much due to nausea and now npo for a test.  Will start novolog 70/30 at 10 units BID instead of her normal home dose of 18 units bid  -restart her gabapentin for her neuropathy as well DVT SCDs/Lovenox 40mg  daily  LOS: 2 days    Kinlie Janice E 03/12/2015, 10:18 AM Pager: [phone]

## 2015-03-13 ENCOUNTER — Inpatient Hospital Stay (HOSPITAL_COMMUNITY): Payer: Medicaid Other

## 2015-03-13 LAB — GLUCOSE, CAPILLARY
GLUCOSE-CAPILLARY: 251 mg/dL — AB (ref 65–99)
GLUCOSE-CAPILLARY: 249 mg/dL — AB (ref 65–99)
Glucose-Capillary: 226 mg/dL — ABNORMAL HIGH (ref 65–99)
Glucose-Capillary: 199 mg/dL — ABNORMAL HIGH (ref 65–99)

## 2015-03-13 MED ORDER — OXYCODONE HCL 5 MG PO TABS
15.0000 mg | ORAL_TABLET | ORAL | Status: DC | PRN
  Administered 2015-03-15 – 2015-03-16 (×4): 20 mg via ORAL
  Filled 2015-03-13 (×4): qty 4

## 2015-03-13 MED ORDER — ALBUTEROL SULFATE (2.5 MG/3ML) 0.083% IN NEBU
2.0000 mL | INHALATION_SOLUTION | Freq: Four times a day (QID) | RESPIRATORY_TRACT | Status: DC | PRN
  Administered 2015-03-13: 3 mL via RESPIRATORY_TRACT
  Administered 2015-03-13 – 2015-03-15 (×2): 2 mL via RESPIRATORY_TRACT
  Filled 2015-03-13 (×3): qty 3

## 2015-03-13 MED ORDER — METHOCARBAMOL 500 MG PO TABS
500.0000 mg | ORAL_TABLET | Freq: Three times a day (TID) | ORAL | Status: DC | PRN
  Administered 2015-03-13 – 2015-03-15 (×4): 1000 mg via ORAL
  Administered 2015-03-15: 500 mg via ORAL
  Filled 2015-03-13 (×3): qty 2
  Filled 2015-03-13: qty 1
  Filled 2015-03-13: qty 2

## 2015-03-13 MED ORDER — INSULIN ASPART PROT & ASPART (70-30 MIX) 100 UNIT/ML ~~LOC~~ SUSP
18.0000 [IU] | Freq: Two times a day (BID) | SUBCUTANEOUS | Status: DC
  Administered 2015-03-13 – 2015-03-16 (×6): 18 [IU] via SUBCUTANEOUS
  Filled 2015-03-13 (×2): qty 10

## 2015-03-13 MED ORDER — HYDROMORPHONE HCL 1 MG/ML IJ SOLN
0.5000 mg | INTRAMUSCULAR | Status: DC | PRN
  Administered 2015-03-13 – 2015-03-15 (×5): 1 mg via INTRAVENOUS
  Filled 2015-03-13 (×5): qty 1

## 2015-03-13 MED ORDER — FUROSEMIDE 10 MG/ML IJ SOLN
40.0000 mg | Freq: Once | INTRAMUSCULAR | Status: AC
  Administered 2015-03-13: 40 mg via INTRAVENOUS
  Filled 2015-03-13: qty 4

## 2015-03-13 MED ORDER — OXYCODONE HCL 5 MG PO TABS
5.0000 mg | ORAL_TABLET | ORAL | Status: DC | PRN
  Administered 2015-03-13 – 2015-03-15 (×7): 10 mg via ORAL
  Filled 2015-03-13 (×7): qty 2

## 2015-03-13 MED ORDER — METFORMIN HCL 500 MG PO TABS
1000.0000 mg | ORAL_TABLET | Freq: Two times a day (BID) | ORAL | Status: DC
  Administered 2015-03-13 – 2015-03-16 (×6): 1000 mg via ORAL
  Filled 2015-03-13 (×6): qty 2

## 2015-03-13 MED ORDER — ACETAMINOPHEN 500 MG PO TABS
1000.0000 mg | ORAL_TABLET | Freq: Four times a day (QID) | ORAL | Status: DC | PRN
  Administered 2015-03-13: 1000 mg via ORAL
  Filled 2015-03-13: qty 2

## 2015-03-13 MED ORDER — MAGNESIUM CITRATE PO SOLN
1.0000 | Freq: Once | ORAL | Status: AC
  Administered 2015-03-13: 1 via ORAL
  Filled 2015-03-13: qty 296

## 2015-03-13 NOTE — Progress Notes (Signed)
Patient ID: Julie Robertson, female   DOB: [birth year], 39 y.o.   MRN: [id number] 2 Days Post-Op  Subjective: Pt with c/o pain still.  C/o HA and itching.  Not mobilizing much.  C/o shortness of breath still at times.  Nausea is improved and she is hungry.  MRI got cancelled as patient can't hold her breath long enough to complete study.  Objective: Vital signs in last 24 hours: Temp:  [97.6 F (36.4 C)-100.3 F (37.9 C)] 100.3 F (37.9 C) (09/09 0609) Pulse Rate:  [95-112] 110 (09/09 0609) Resp:  [19-22] 19 (09/09 0609) BP: (107-155)/(62-68) 155/68 mmHg (09/09 0609) SpO2:  [91 %-98 %] 98 % (09/09 0609) Last BM Date: 03/08/15  Intake/Output from previous day: 09/08 0701 - 09/09 0700 In: 150 [P.O.:150] Out: -  Intake/Output this shift:    PE: Gen: NAD Heart: regular, but mildly tachy Lungs: Few crackles at bases and minimal inspiratory wheeze, right anterior.  Decrease BS at bases, some tachypnea, on 2L [place] (mostly for comfort) Abd: soft, still very tender, +BS, obese, Nd, incisions c/d/i  Lab Results:  No results for input(s): WBC, HGB, HCT, PLT in the last 72 hours. BMET  Recent Labs  03/12/15 0455  NA 133*  K 3.9  CL 99*  CO2 26  GLUCOSE 284*  BUN <5*  CREATININE 0.71  CALCIUM 7.9*   PT/INR No results for input(s): LABPROT, INR in the last 72 hours. CMP     Component Value Date/Time   NA 133* 03/12/2015 0455   K 3.9 03/12/2015 0455   CL 99* 03/12/2015 0455   CO2 26 03/12/2015 0455   GLUCOSE 284* 03/12/2015 0455   BUN <5* 03/12/2015 0455   CREATININE 0.71 03/12/2015 0455   CREATININE 0.84 12/17/2014 1749   CALCIUM 7.9* 03/12/2015 0455   PROT 6.0* 03/12/2015 0455   ALBUMIN 2.3* 03/12/2015 0455   AST 40 03/12/2015 0455   ALT 68* 03/12/2015 0455   ALKPHOS 147* 03/12/2015 0455   BILITOT 1.5* 03/12/2015 0455   GFRNONAA >60 03/12/2015 0455   GFRNONAA 87 12/17/2014 1749   GFRAA >60 03/12/2015 0455   GFRAA >89 12/17/2014 1749   Lipase      Component Value Date/Time   LIPASE 13* 03/12/2015 0455       Studies/Results: Dg Ercp Biliary & Pancreatic Ducts  03/11/2015   CLINICAL DATA:  Stent  EXAM: ERCP  TECHNIQUE: Multiple spot images obtained with the fluoroscopic device and submitted for interpretation post-procedure.  FLUOROSCOPY TIME:  Radiation Exposure Index (as provided by the fluoroscopic device): See below  If the device does not provide the exposure index:  Fluoroscopy Time:  3 minutes and 10 seconds  Number of Acquired Images:  4  COMPARISON:  None.  FINDINGS: Images demonstrate contrast filling of the common bile duct. It is dilated. A polypoid filling defect is present in the distal common bile duct. The last image demonstrates a biliary stent within the distal common bile duct.  IMPRESSION: See above  These images were submitted for radiologic interpretation only. Please see the procedural report for the amount of contrast and the fluoroscopy time utilized.   Electronically Signed   By: Marybelle Killings M.D.   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 -after a 45 minute discussion with the patient via an interpretor, we discussed MANY things.  I did discuss her pathology with her.  Her cousin is present in the room and seems to understand more than the patient and is helping to explain all of this to the patient as well. -hold off on MRI.  Patient only measuring 500 on IS.  She will unlikely be able to do MRI while here.  Will have this set up as an  outpatient. -advance to low fat diet today -tylenol for HA -I emphasized greatly to the patient the need to continue working on her IS and mobilizing in the halls at least 3-4 times as this would help her pain and her breathing. -Rocephin D4, likely does not need many more days of this. -IV pain meds for breakthrough only -oral meds will be tylenol, robaxin, oxy IR 5-20, tramadol 50, schedule toradol.  She also have some ativan ordered for anxiety. DM -on resistant SSI -restart home DM meds since she will be on a low fat diet  -restart her gabapentin for her neuropathy as well Shortness of breath -patient has a h/o smoking as well as asthma.  She is laying down most of the time in her chair.  I have d/w her the need to sit up so she can take big deep breathes as well as mobilizing as this will help. -she does have a few adverse breath sounds, so will check a PCXR, but suspect this will mostly likely just show atelectasis. -add her albuterol inhaler from home O2 for comfort and prn -saline lock IV to not fluid overload her Chronic constipation -daily miralax -mag citrate ordered today (takes this at home every 3 days if no BM) DVT SCDs/Lovenox 40mg  daily   Dispo - remain inpatient.  She will follow up with [facility] as an outpatient and I have contacted her and her RN to set up her follow up MRI and appointment.  LOS: 3 days    Olon Russ E 03/13/2015, 9:38 AM Pager: [phone]

## 2015-03-13 NOTE — Progress Notes (Signed)
Inpatient Diabetes Program Recommendations  AACE/ADA: New Consensus Statement on Inpatient Glycemic Control (2013)  Target Ranges:  Prepandial:   less than 140 mg/dL      Peak postprandial:   less than 180 mg/dL (1-2 hours)      Critically ill patients:  140 - 180 mg/dL   Results for Julie Robertson, Julie Robertson (MRN [id number]) as of 03/13/2015 16:00  Ref. Range 03/12/2015 11:32 03/12/2015 16:31 03/12/2015 21:31 03/13/2015 07:50 03/13/2015 11:30  Glucose-Capillary Latest Ref Range: 65-99 mg/dL 251 (H) 175 (H) 202 (H) 251 (H) 226 (H)   Results for Julie Robertson, Julie Robertson (MRN [id number]) as of 03/13/2015 16:00  Ref. Range 03/09/2015 13:56  Hemoglobin A1C Latest Ref Range: 4.8-5.6 % 9.5 (H)   Needs insulin adjustment.  Recommendations: Increase 70/30 to 17 units bid. Add HS correction.  Will continue to follow. Thank you. Lorenda Peck, RD, LDN, CDE Inpatient Diabetes Coordinator [phone]

## 2015-03-13 NOTE — Progress Notes (Signed)
Received report from MRI tech that pt unable to hold her breath long enough to get a good image, reported to oncall MD

## 2015-03-14 ENCOUNTER — Inpatient Hospital Stay (HOSPITAL_COMMUNITY): Payer: Medicaid Other

## 2015-03-14 LAB — COMPREHENSIVE METABOLIC PANEL
ALBUMIN: 2.3 g/dL — AB (ref 3.5–5.0)
ALT: 39 U/L (ref 14–54)
AST: 21 U/L (ref 15–41)
Alkaline Phosphatase: 122 U/L (ref 38–126)
Anion gap: 10 (ref 5–15)
BUN: 7 mg/dL (ref 6–20)
CHLORIDE: 95 mmol/L — AB (ref 101–111)
CO2: 29 mmol/L (ref 22–32)
CREATININE: 0.74 mg/dL (ref 0.44–1.00)
Calcium: 8.1 mg/dL — ABNORMAL LOW (ref 8.9–10.3)
GFR calc Af Amer: >60 mL/min (ref >60)
GLUCOSE: 227 mg/dL — AB (ref 65–99)
POTASSIUM: 3.3 mmol/L — AB (ref 3.5–5.1)
Sodium: 134 mmol/L — ABNORMAL LOW (ref 135–145)
Total Bilirubin: 1.1 mg/dL (ref 0.3–1.2)
Total Protein: 6.2 g/dL — ABNORMAL LOW (ref 6.5–8.1)

## 2015-03-14 LAB — GLUCOSE, CAPILLARY
GLUCOSE-CAPILLARY: 198 mg/dL — AB (ref 65–99)
GLUCOSE-CAPILLARY: 198 mg/dL — AB (ref 65–99)
GLUCOSE-CAPILLARY: 220 mg/dL — AB (ref 65–99)
Glucose-Capillary: 231 mg/dL — ABNORMAL HIGH (ref 65–99)

## 2015-03-14 LAB — CBC
HEMATOCRIT: 26.5 % — AB (ref 36.0–46.0)
Hemoglobin: 7.8 g/dL — ABNORMAL LOW (ref 12.0–15.0)
MCH: 20.8 pg — ABNORMAL LOW (ref 26.0–34.0)
MCHC: 29.4 g/dL — ABNORMAL LOW (ref 30.0–36.0)
MCV: 70.7 fL — AB (ref 78.0–100.0)
PLATELETS: 317 10*3/uL (ref 150–400)
RBC: 3.75 MIL/uL — AB (ref 3.87–5.11)
RDW: 17.3 % — AB (ref 11.5–15.5)
WBC: 10.2 10*3/uL (ref 4.0–10.5)

## 2015-03-14 LAB — LIPASE, BLOOD: Lipase: 14 U/L — ABNORMAL LOW (ref 22–51)

## 2015-03-14 LAB — AMYLASE: AMYLASE: 33 U/L (ref 28–100)

## 2015-03-14 IMAGING — CR DG CHEST 2V
2 series · 2 of 2 positions shown · non-contrast
Comparison: [DATE]

CLINICAL DATA: Shortness of breath.

EXAM:
CHEST  2 VIEW

[chest pa]
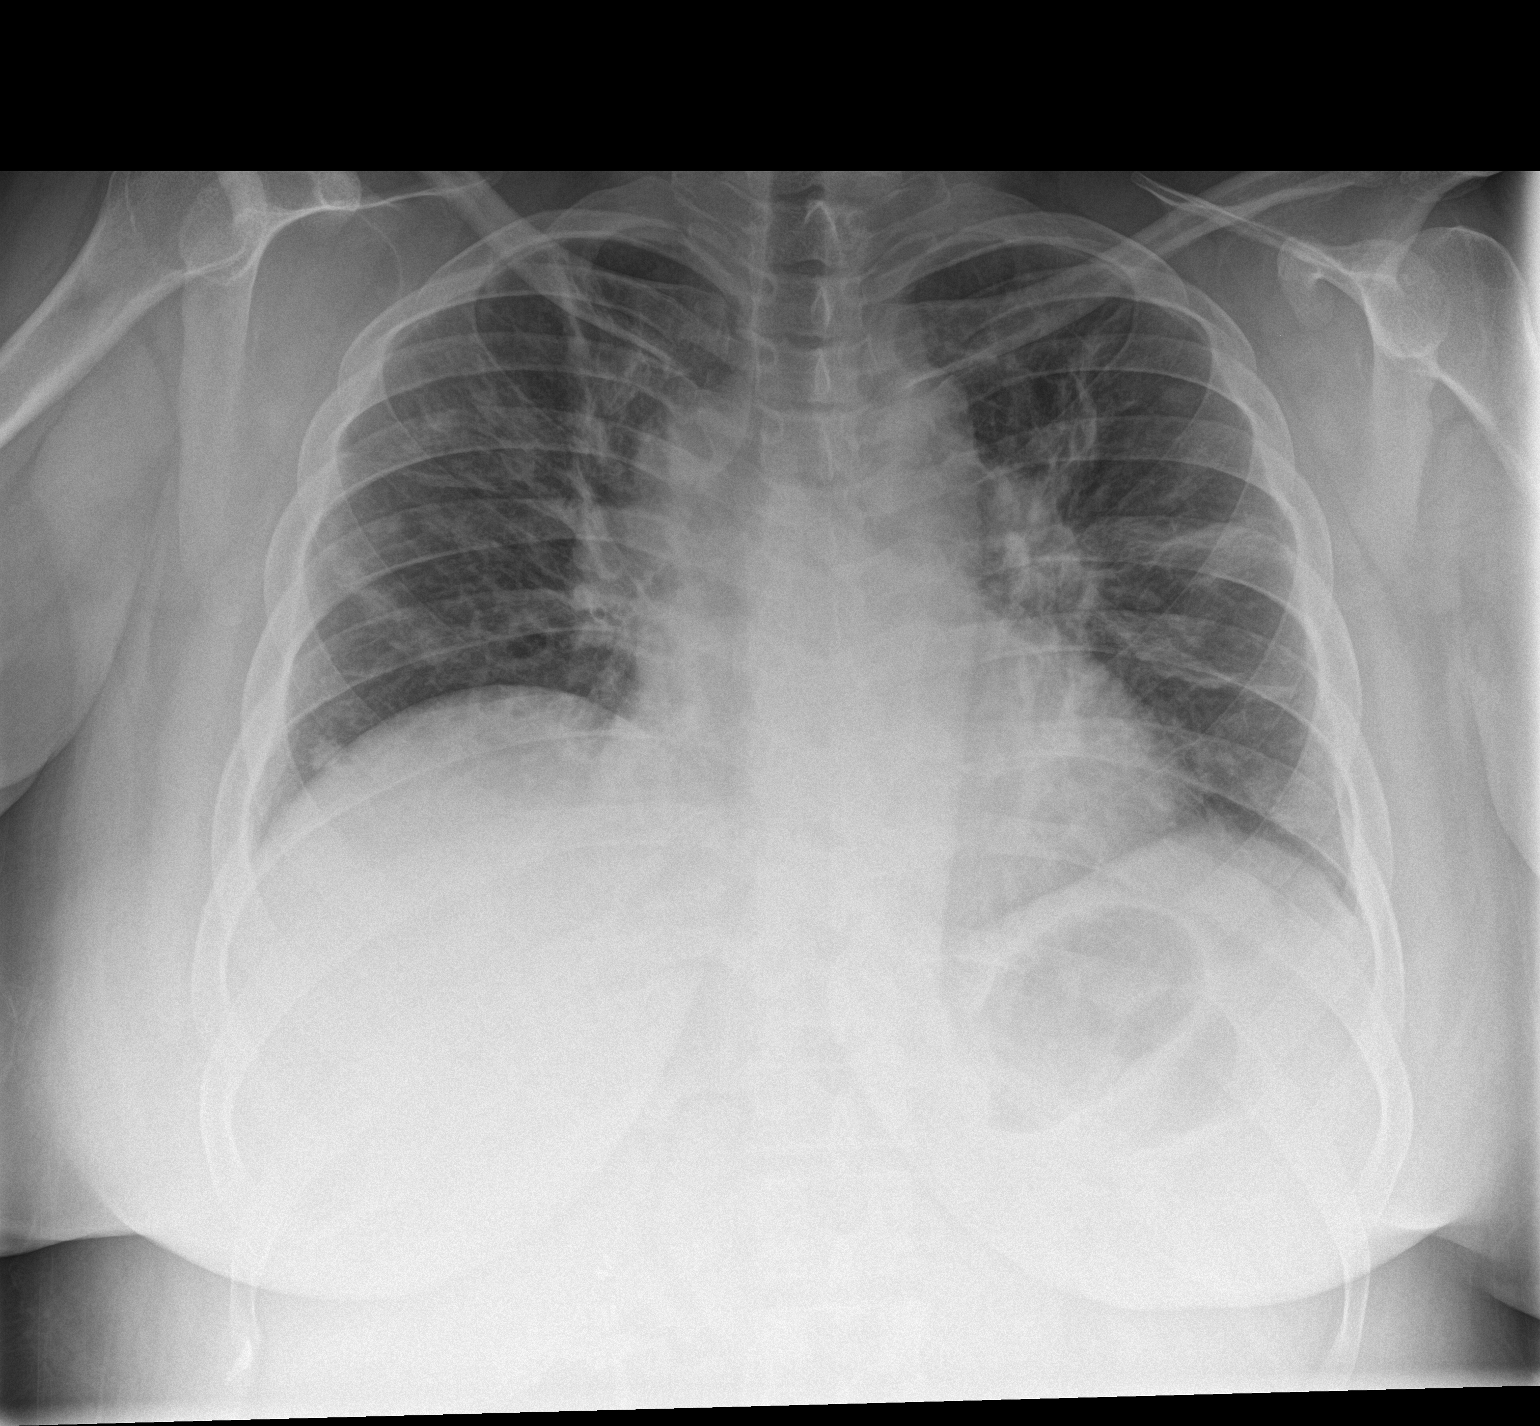

[chest lat]
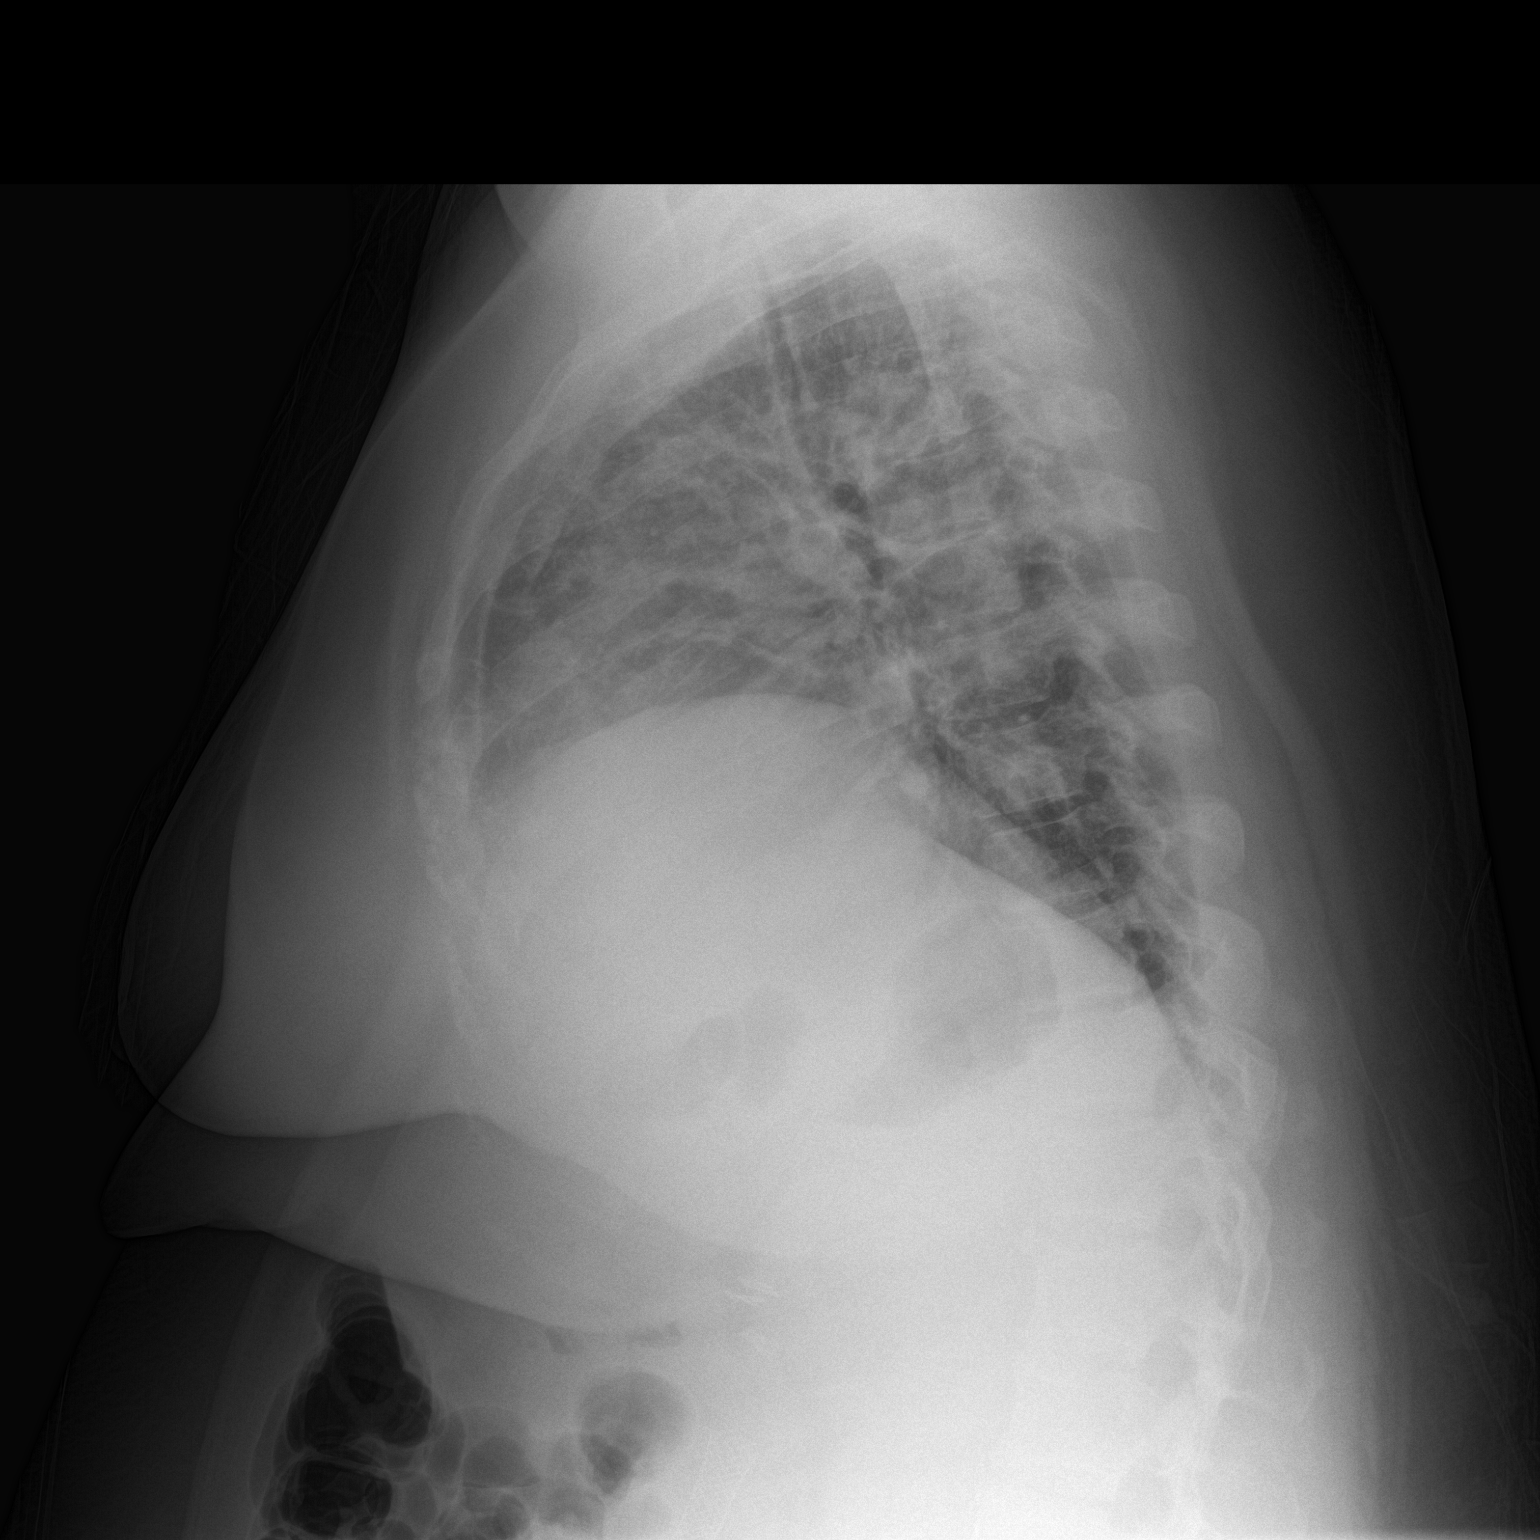

[2 of 2 positions shown; findings below may reference images not displayed]

FINDINGS: Decrease in streaky perihilar opacities morphology favoring
atelectasis. Small right lung opacities are nodular. Lung volumes
remain low. Stable borderline cardiomegaly and vascular pedicle
widening, accentuated by volumes. No acute osseous finding.
IMPRESSION: Multifocal bilateral atelectasis. Some densities on the right are
nodular, recommend follow-up to clearing.

## 2015-03-14 MED ORDER — POTASSIUM CHLORIDE 10 MEQ/100ML IV SOLN
10.0000 meq | INTRAVENOUS | Status: AC
  Administered 2015-03-14 (×4): 10 meq via INTRAVENOUS
  Filled 2015-03-14 (×4): qty 100

## 2015-03-14 NOTE — Progress Notes (Signed)
3 Days Post-Op  Subjective: She reports increased epigastric pain today. Had decreased O2 sats  Objective: Vital signs in last 24 hours: Temp:  [98.1 F (36.7 C)-99.5 F (37.5 C)] 99.5 F (37.5 C) (09/10 0556) Pulse Rate:  [97-102] 97 (09/10 0556) Resp:  [18-20] 18 (09/10 0556) BP: (115-120)/(64-76) 120/76 mmHg (09/10 0556) SpO2:  [87 %-96 %] 87 % (09/10 0556) Last BM Date: 03/13/15  Intake/Output from previous day: 09/09 0701 - 09/10 0700 In: 869 [P.O.:869] Out: -  Intake/Output this shift:    Sitting in chair Lungs clear CV RRR Abdomen soft and obese, mild to moderate epigastric tenderness  Lab Results:  No results for input(s): WBC, HGB, HCT, PLT in the last 72 hours. BMET  Recent Labs  03/12/15 0455  NA 133*  K 3.9  CL 99*  CO2 26  GLUCOSE 284*  BUN <5*  CREATININE 0.71  CALCIUM 7.9*   PT/INR No results for input(s): LABPROT, INR in the last 72 hours. ABG No results for input(s): PHART, HCO3 in the last 72 hours.  Invalid input(s): PCO2, PO2  Studies/Results: Dg Chest Port 1 View  03/13/2015   CLINICAL DATA:  Shortness of breath, midchest pain, abdominal surgery last week, history of diabetes, hyperlipidemia, smoking.  EXAM: PORTABLE CHEST - 1 VIEW  COMPARISON:  PA and lateral chest x-ray of January 15, 2011  FINDINGS: The lungs are hypoinflated. The interstitial markings are increased bilaterally. The pulmonary vascularity is prominent. The cardiac silhouette is mildly enlarged. There is no significant pleural effusion there is no pneumothorax. The bony thorax is unremarkable.  IMPRESSION: Hypo inflation of both lungs with accentuates the interstitial markings but true pulmonary vascular congestion and mild interstitial edema is suspected. If the patient can tolerate the procedure, a PA and lateral chest x-ray with deep inspiration would be useful.   Electronically Signed   By: David  Martinique M.D.   On: 03/13/2015 09:57    Anti-infectives: Anti-infectives    Start     Dose/Rate Route Frequency Ordered Stop   03/09/15 2045  cefTRIAXone (ROCEPHIN) 2 g in dextrose 5 % 50 mL IVPB     2 g 100 mL/hr over 30 Minutes Intravenous Every 24 hours 03/09/15 2037        Assessment/Plan: s/p Procedure(s): ENDOSCOPIC RETROGRADE CHOLANGIOPANCREATOGRAPHY (ERCP) (N/A)  Increased abdominal pain Check pcxr given O2 sats Check labs ambulate  LOS: 4 days    Marley Charlot A 03/14/2015

## 2015-03-14 NOTE — ED Provider Notes (Signed)
CSN: [id number]     Arrival date & time 03/09/15  1324 History   First MD Initiated Contact with Patient 03/09/15 1450     Chief Complaint  Patient presents with  . Abdominal Pain      HPI  Patient presents describing pain for the last 2-3 days. Not able to eat much without significant right upper quadrant pain nausea. Felt feverish with chills 2 days ago. No dark urine. No light stools. No history of cholecystitis. She states no history of gallbladder disease however in her chart has a previous ultrasound showing cholelithiasis. 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    Past Medical History  Diagnosis Date  . Diabetes mellitus without complication   . Dyslipidemia 08/2009  . Gall stones 08/2009  . 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  numbness tingling in right fingers. rx with Gabapentin.  . Obesity     BMI 41, 250# 03/2015   Past Surgical History  Procedure Laterality Date  . Tubal ligation    . Cholecystectomy N/A 03/10/2015    Procedure: LAPAROSCOPIC CHOLECYSTECTOMY WITH INTRAOPERATIVE CHOLANGIOGRAM;  Surgeon: Georganna Skeans, MD;  Location: [facility];  Service: General;  Laterality: N/A;  . Ercp N/A 03/11/2015    Procedure: ENDOSCOPIC RETROGRADE CHOLANGIOPANCREATOGRAPHY (ERCP);  Surgeon: Milus Banister, MD;  Location: [facility];  Service: Endoscopy;  Laterality: N/A;   Family History  Problem Relation Age of Onset  . Diabetes Mother   . Heart disease Mother   . Cancer Mother   . Cancer Father   . Diabetes Father   . Heart disease Father    Social History  Substance Use Topics  . Smoking status: Current Every Day Smoker -- 1.50 packs/day for 4 years    Types: Cigarettes  . Smokeless tobacco: None  . Alcohol Use: Yes     Comment: socially   OB History    No data available     Review of Systems  Constitutional: Positive for chills. Negative for fever, diaphoresis, appetite change and fatigue.  HENT: Negative for mouth sores, sore throat and  trouble swallowing.   Eyes: Negative for visual disturbance.  Respiratory: Negative for cough, chest tightness, shortness of breath and wheezing.   Cardiovascular: Negative for chest pain.  Gastrointestinal: Positive for nausea and abdominal pain. Negative for vomiting, diarrhea and abdominal distention.  Endocrine: Negative for polydipsia, polyphagia and polyuria.  Genitourinary: Negative for dysuria, frequency and hematuria.  Musculoskeletal: Negative for gait problem.  Skin: Negative for color change, pallor and rash.  Neurological: Negative for dizziness, syncope, light-headedness and headaches.  Hematological: Does not bruise/bleed easily.  Psychiatric/Behavioral: Negative for behavioral problems and confusion.      Allergies  Review of patient's allergies indicates no known allergies.  Home Medications   Prior to Admission medications   Medication Sig Start Date End Date Taking? Authorizing Provider  atorvastatin (LIPITOR) 10 MG tablet Take 1 tablet (10 mg total) by mouth daily. Patient taking differently: Take 10 mg by mouth daily at 6 PM.  12/17/14  Yes Deepak Advani, MD  cephALEXin (KEFLEX) 500 MG capsule Take 1 capsule (500 mg total) by mouth 4 (four) times daily. Patient taking differently: Take 500 mg by mouth 4 (four) times daily. STARTED 03/06/15, FOR 5 DAYS ENDING 03/10/15 03/06/15  Yes Leone Brand, MD  gabapentin (NEURONTIN) 300 MG capsule Take 1 capsule (300 mg total) by mouth 3 (three) times daily. 12/17/14  Yes Lorayne Marek, MD  ibuprofen (ADVIL,MOTRIN) 200 MG  tablet Take 400 mg by mouth every 6 (six) hours as needed for moderate pain.   Yes Historical Provider, MD  insulin NPH-regular Human (HUMULIN 70/30) (70-30) 100 UNIT/ML injection INJECT 17 UNITS UNDER THE SKIN 2 TIMES DAILY Patient taking differently: Inject 18 Units into the skin 2 (two) times daily with a meal.  12/17/14  Yes Lorayne Marek, MD  metFORMIN (GLUCOPHAGE) 1000 MG tablet Take 1 tablet (1,000 mg total)  by mouth 2 (two) times daily with a meal. 12/17/14  Yes Deepak Advani, MD  albuterol (PROVENTIL HFA;VENTOLIN HFA) 108 (90 BASE) MCG/ACT inhaler Inhale 2 puffs into the lungs every 6 (six) hours as needed for wheezing or shortness of breath. 12/17/14   Lorayne Marek, MD  azithromycin (ZITHROMAX Z-PAK) 250 MG tablet Bronchitis Patient not taking: Reported on 10/17/2014 10/02/14   Brayton Caves, PA-C  glucose monitoring kit (FREESTYLE) monitoring kit 1 each by Does not apply route 4 (four) times daily - after meals and at bedtime. 1 month Diabetic Testing Supplies for QAC-QHS accuchecks. 07/03/13   Lorayne Marek, MD  guaiFENesin-codeine 100-10 MG/5ML syrup Take 5 mLs by mouth every 4 (four) hours as needed for cough. Patient not taking: Reported on 10/17/2014 10/02/14   Brayton Caves, PA-C  Insulin Pen Needle 31G X 5 MM MISC Inject 10 units subcutaneous at bedtime 07/03/13   Lorayne Marek, MD  magnesium hydroxide (MILK OF MAGNESIA) 400 MG/5ML suspension Take 5 mLs by mouth daily as needed for mild constipation. Patient not taking: Reported on 10/02/2014 07/03/13   Lorayne Marek, MD  nicotine (NICODERM CQ - DOSED IN MG/24 HOURS) 14 mg/24hr patch Place 1 patch (14 mg total) onto the skin daily. Patient not taking: Reported on 10/02/2014 07/03/13   Lorayne Marek, MD   BP 120/76 mmHg  Pulse 97  Temp(Src) 99.5 F (37.5 C) (Oral)  Resp 18  Ht _0  (1.651 m)  Wt 250 lb 7.1 oz (113.6 kg)  BMI 41.68 kg/m2  SpO2 87%  LMP 02/28/2015 Physical Exam  Constitutional: She is oriented to person, place, and time. She appears well-developed and well-nourished. No distress.  HENT:  Head: Normocephalic.  Eyes: Conjunctivae are normal. Pupils are equal, round, and reactive to light. No scleral icterus.  Neck: Normal range of motion. Neck supple. No thyromegaly present.  Cardiovascular: Normal rate and regular rhythm.  Exam reveals no gallop and no friction rub.   No murmur heard. Pulmonary/Chest: Effort normal and  breath sounds normal. No respiratory distress. She has no wheezes. She has no rales.  Abdominal: Soft. Bowel sounds are normal. She exhibits no distension. There is no tenderness. There is no rebound.    Tenderness. Positive Murphy's findings in the right upper quadrant.  Musculoskeletal: Normal range of motion.  Neurological: She is alert and oriented to person, place, and time.  Skin: Skin is warm and dry. No rash noted.  Psychiatric: She has a normal mood and affect. Her behavior is normal.    ED Course  Procedures (including critical care time) Labs Review Labs Reviewed  SURGICAL PCR SCREEN - Abnormal; Notable for the following:    Staphylococcus aureus POSITIVE (*)    All other components within normal limits  LIPASE, BLOOD - Abnormal; Notable for the following:    Lipase 18 (*)    All other components within normal limits  COMPREHENSIVE METABOLIC PANEL - Abnormal; Notable for the following:    CO2 21 (*)    Glucose, Bld 263 (*)    Albumin 3.2 (*)  AST 57 (*)    ALT 135 (*)    Alkaline Phosphatase 223 (*)    Total Bilirubin 3.0 (*)    All other components within normal limits  CBC - Abnormal; Notable for the following:    RBC 5.15 (*)    Hemoglobin 10.4 (*)    MCV 70.1 (*)    MCH 20.2 (*)    MCHC 28.8 (*)    RDW 16.6 (*)    All other components within normal limits  URINALYSIS, ROUTINE W REFLEX MICROSCOPIC (NOT AT [facility]) - Abnormal; Notable for the following:    Color, Urine AMBER (*)    Specific Gravity, Urine 1.038 (*)    Glucose, UA >1000 (*)    Bilirubin Urine MODERATE (*)    Ketones, ur 40 (*)    Protein, ur 30 (*)    All other components within normal limits  URINE MICROSCOPIC-ADD ON - Abnormal; Notable for the following:    Squamous Epithelial / LPF MANY (*)    Bacteria, UA FEW (*)    All other components within normal limits  COMPREHENSIVE METABOLIC PANEL - Abnormal; Notable for the following:    CO2 19 (*)    Glucose, Bld 189 (*)    Calcium 8.4 (*)     Albumin 2.9 (*)    AST 50 (*)    ALT 102 (*)    Alkaline Phosphatase 188 (*)    Total Bilirubin 2.3 (*)    All other components within normal limits  CBC - Abnormal; Notable for the following:    Hemoglobin 9.8 (*)    HCT 33.5 (*)    MCV 71.0 (*)    MCH 20.8 (*)    MCHC 29.3 (*)    RDW 16.9 (*)    All other components within normal limits  HEMOGLOBIN A1C - Abnormal; Notable for the following:    Hgb A1c MFr Bld 9.5 (*)    All other components within normal limits  LIPASE, BLOOD - Abnormal; Notable for the following:    Lipase 21 (*)    All other components within normal limits  GLUCOSE, CAPILLARY - Abnormal; Notable for the following:    Glucose-Capillary 148 (*)    All other components within normal limits  GLUCOSE, CAPILLARY - Abnormal; Notable for the following:    Glucose-Capillary 172 (*)    All other components within normal limits  GLUCOSE, CAPILLARY - Abnormal; Notable for the following:    Glucose-Capillary 199 (*)    All other components within normal limits  GLUCOSE, CAPILLARY - Abnormal; Notable for the following:    Glucose-Capillary 249 (*)    All other components within normal limits  GLUCOSE, CAPILLARY - Abnormal; Notable for the following:    Glucose-Capillary 246 (*)    All other components within normal limits  GLUCOSE, CAPILLARY - Abnormal; Notable for the following:    Glucose-Capillary 272 (*)    All other components within normal limits  GLUCOSE, CAPILLARY - Abnormal; Notable for the following:    Glucose-Capillary 222 (*)    All other components within normal limits  GLUCOSE, CAPILLARY - Abnormal; Notable for the following:    Glucose-Capillary 200 (*)    All other components within normal limits  COMPREHENSIVE METABOLIC PANEL - Abnormal; Notable for the following:    Sodium 133 (*)    Chloride 99 (*)    Glucose, Bld 284 (*)    BUN <5 (*)    Calcium 7.9 (*)    Total Protein  6.0 (*)    Albumin 2.3 (*)    ALT 68 (*)    Alkaline Phosphatase  147 (*)    Total Bilirubin 1.5 (*)    All other components within normal limits  GLUCOSE, CAPILLARY - Abnormal; Notable for the following:    Glucose-Capillary 234 (*)    All other components within normal limits  GLUCOSE, CAPILLARY - Abnormal; Notable for the following:    Glucose-Capillary 302 (*)    All other components within normal limits  GLUCOSE, CAPILLARY - Abnormal; Notable for the following:    Glucose-Capillary 255 (*)    All other components within normal limits  LIPASE, BLOOD - Abnormal; Notable for the following:    Lipase 13 (*)    All other components within normal limits  GLUCOSE, CAPILLARY - Abnormal; Notable for the following:    Glucose-Capillary 251 (*)    All other components within normal limits  GLUCOSE, CAPILLARY - Abnormal; Notable for the following:    Glucose-Capillary 175 (*)    All other components within normal limits  GLUCOSE, CAPILLARY - Abnormal; Notable for the following:    Glucose-Capillary 202 (*)    All other components within normal limits  GLUCOSE, CAPILLARY - Abnormal; Notable for the following:    Glucose-Capillary 251 (*)    All other components within normal limits  GLUCOSE, CAPILLARY - Abnormal; Notable for the following:    Glucose-Capillary 226 (*)    All other components within normal limits  GLUCOSE, CAPILLARY - Abnormal; Notable for the following:    Glucose-Capillary 249 (*)    All other components within normal limits  GLUCOSE, CAPILLARY - Abnormal; Notable for the following:    Glucose-Capillary 199 (*)    All other components within normal limits  GLUCOSE, CAPILLARY - Abnormal; Notable for the following:    Glucose-Capillary 198 (*)    All other components within normal limits  CBC - Abnormal; Notable for the following:    RBC 3.75 (*)    Hemoglobin 7.8 (*)    HCT 26.5 (*)    MCV 70.7 (*)    MCH 20.8 (*)    MCHC 29.4 (*)    RDW 17.3 (*)    All other components within normal limits  COMPREHENSIVE METABOLIC PANEL -  Abnormal; Notable for the following:    Sodium 134 (*)    Potassium 3.3 (*)    Chloride 95 (*)    Glucose, Bld 227 (*)    Calcium 8.1 (*)    Total Protein 6.2 (*)    Albumin 2.3 (*)    All other components within normal limits  LIPASE, BLOOD - Abnormal; Notable for the following:    Lipase 14 (*)    All other components within normal limits  GLUCOSE, CAPILLARY - Abnormal; Notable for the following:    Glucose-Capillary 231 (*)    All other components within normal limits  PREGNANCY, URINE  AMYLASE  SURGICAL PATHOLOGY  SURGICAL PATHOLOGY    Imaging Review Dg Chest 2 View  03/14/2015   CLINICAL DATA:  Shortness of breath.  EXAM: CHEST  2 VIEW  COMPARISON:  03/13/2015  FINDINGS: Decrease in streaky perihilar opacities morphology favoring atelectasis. Small right lung opacities are nodular. Lung volumes remain low. Stable borderline cardiomegaly and vascular pedicle widening, accentuated by volumes. No acute osseous finding.  IMPRESSION: Multifocal bilateral atelectasis. Some densities on the right are nodular, recommend follow-up to clearing.   Electronically Signed   By: Neva Seat.D.  On: 03/14/2015 09:24   Dg Chest Port 1 View  03/13/2015   CLINICAL DATA:  Shortness of breath, midchest pain, abdominal surgery last week, history of diabetes, hyperlipidemia, smoking.  EXAM: PORTABLE CHEST - 1 VIEW  COMPARISON:  PA and lateral chest x-ray of January 15, 2011  FINDINGS: The lungs are hypoinflated. The interstitial markings are increased bilaterally. The pulmonary vascularity is prominent. The cardiac silhouette is mildly enlarged. There is no significant pleural effusion there is no pneumothorax. The bony thorax is unremarkable.  IMPRESSION: Hypo inflation of both lungs with accentuates the interstitial markings but true pulmonary vascular congestion and mild interstitial edema is suspected. If the patient can tolerate the procedure, a PA and lateral chest x-ray with deep inspiration would  be useful.   Electronically Signed   By: David  Martinique M.D.   On: 03/13/2015 09:57   I have personally reviewed and evaluated these images and lab results as part of my medical decision-making.   EKG Interpretation None      MDM   Final diagnoses:  RUQ pain    Patient with cholecystitis by ultrasound. Elevated enzymes. Minimal elevation of white blood cell count. I discussed the case with Dr. Rosendo Gros. He is here planning admission.    Tanna Furry, MD 03/14/15 [phone]

## 2015-03-15 LAB — BASIC METABOLIC PANEL
Anion gap: 9 (ref 5–15)
BUN: 6 mg/dL (ref 6–20)
CHLORIDE: 97 mmol/L — AB (ref 101–111)
CO2: 31 mmol/L (ref 22–32)
CREATININE: 0.81 mg/dL (ref 0.44–1.00)
Calcium: 8.6 mg/dL — ABNORMAL LOW (ref 8.9–10.3)
GFR calc Af Amer: >60 mL/min (ref >60)
GFR calc non Af Amer: >60 mL/min (ref >60)
Glucose, Bld: 187 mg/dL — ABNORMAL HIGH (ref 65–99)
Potassium: 3.3 mmol/L — ABNORMAL LOW (ref 3.5–5.1)
SODIUM: 137 mmol/L (ref 135–145)

## 2015-03-15 LAB — GLUCOSE, CAPILLARY
GLUCOSE-CAPILLARY: 180 mg/dL — AB (ref 65–99)
Glucose-Capillary: 267 mg/dL — ABNORMAL HIGH (ref 65–99)
Glucose-Capillary: 166 mg/dL — ABNORMAL HIGH (ref 65–99)
Glucose-Capillary: 224 mg/dL — ABNORMAL HIGH (ref 65–99)

## 2015-03-15 MED ORDER — POTASSIUM CHLORIDE 10 MEQ/100ML IV SOLN
10.0000 meq | Freq: Once | INTRAVENOUS | Status: AC
  Administered 2015-03-15: 10 meq via INTRAVENOUS
  Filled 2015-03-15: qty 100

## 2015-03-15 NOTE — Progress Notes (Signed)
4 Days Post-Op  Subjective: Feels better this morning Still with RUQ pain  Objective: Vital signs in last 24 hours: Temp:  [98.6 F (37 C)-99.5 F (37.5 C)] 98.6 F (37 C) (09/11 6629) Pulse Rate:  [88-93] 88 (09/11 0633) Resp:  [18-20] 20 (09/11 0633) BP: (122-138)/(72-82) 137/82 mmHg (09/11 0633) SpO2:  [90 %-91 %] 90 % (09/11 4765) Last BM Date: 03/14/15  Intake/Output from previous day: 09/10 0701 - 09/11 0700 In: 2270 [P.O.:1971; IV Piggyback:300] Out: -  Intake/Output this shift:    Lungs with some crackles Abdomen soft with mild RUQ tenderness  Lab Results:   Recent Labs  03/14/15 0949  WBC 10.2  HGB 7.8*  HCT 26.5*  PLT 317   BMET  Recent Labs  03/14/15 0949 03/15/15 0411  NA 134* 137  K 3.3* 3.3*  CL 95* 97*  CO2 29 31  GLUCOSE 227* 187*  BUN 7 6  CREATININE 0.74 0.81  CALCIUM 8.1* 8.6*   PT/INR No results for input(s): LABPROT, INR in the last 72 hours. ABG No results for input(s): PHART, HCO3 in the last 72 hours.  Invalid input(s): PCO2, PO2  Studies/Results: Dg Chest 2 View  03/14/2015   CLINICAL DATA:  Shortness of breath.  EXAM: CHEST  2 VIEW  COMPARISON:  03/13/2015  FINDINGS: Decrease in streaky perihilar opacities morphology favoring atelectasis. Small right lung opacities are nodular. Lung volumes remain low. Stable borderline cardiomegaly and vascular pedicle widening, accentuated by volumes. No acute osseous finding.  IMPRESSION: Multifocal bilateral atelectasis. Some densities on the right are nodular, recommend follow-up to clearing.   Electronically Signed   By: Monte Fantasia M.D.   On: 03/14/2015 09:24   Dg Chest Port 1 View  03/13/2015   CLINICAL DATA:  Shortness of breath, midchest pain, abdominal surgery last week, history of diabetes, hyperlipidemia, smoking.  EXAM: PORTABLE CHEST - 1 VIEW  COMPARISON:  PA and lateral chest x-ray of January 15, 2011  FINDINGS: The lungs are hypoinflated. The interstitial markings are increased  bilaterally. The pulmonary vascularity is prominent. The cardiac silhouette is mildly enlarged. There is no significant pleural effusion there is no pneumothorax. The bony thorax is unremarkable.  IMPRESSION: Hypo inflation of both lungs with accentuates the interstitial markings but true pulmonary vascular congestion and mild interstitial edema is suspected. If the patient can tolerate the procedure, a PA and lateral chest x-ray with deep inspiration would be useful.   Electronically Signed   By: David  Martinique M.D.   On: 03/13/2015 09:57    Anti-infectives: Anti-infectives    Start     Dose/Rate Route Frequency Ordered Stop   03/09/15 2045  cefTRIAXone (ROCEPHIN) 2 g in dextrose 5 % 50 mL IVPB     2 g 100 mL/hr over 30 Minutes Intravenous Every 24 hours 03/09/15 2037        Assessment/Plan: s/p Procedure(s): ENDOSCOPIC RETROGRADE CHOLANGIOPANCREATOGRAPHY (ERCP) (N/A)  CXR with atelectasis.  02 sats still down.  Working with incentive spirometer Anemia.  Will repeat CBC and cxr tomorrow   LOS: 5 days    Leray Garverick A 03/15/2015

## 2015-03-16 ENCOUNTER — Other Ambulatory Visit: Payer: Self-pay | Admitting: General Surgery

## 2015-03-16 ENCOUNTER — Telehealth: Payer: Self-pay | Admitting: Internal Medicine

## 2015-03-16 ENCOUNTER — Inpatient Hospital Stay (HOSPITAL_COMMUNITY): Payer: Medicaid Other

## 2015-03-16 DIAGNOSIS — D490 Neoplasm of unspecified behavior of digestive system: Secondary | ICD-10-CM

## 2015-03-16 LAB — CBC
HEMATOCRIT: 26.5 % — AB (ref 36.0–46.0)
Hemoglobin: 7.7 g/dL — ABNORMAL LOW (ref 12.0–15.0)
MCH: 20.4 pg — AB (ref 26.0–34.0)
MCHC: 29.1 g/dL — AB (ref 30.0–36.0)
MCV: 70.3 fL — AB (ref 78.0–100.0)
Platelets: 354 10*3/uL (ref 150–400)
RBC: 3.77 MIL/uL — ABNORMAL LOW (ref 3.87–5.11)
RDW: 17.3 % — AB (ref 11.5–15.5)
WBC: 10.6 10*3/uL — ABNORMAL HIGH (ref 4.0–10.5)

## 2015-03-16 LAB — GLUCOSE, CAPILLARY: Glucose-Capillary: 206 mg/dL — ABNORMAL HIGH (ref 65–99)

## 2015-03-16 IMAGING — DX DG CHEST 2V
2 series · 2 of 2 positions shown · non-contrast
Comparison: [DATE] [DATE], [DATE]; [DATE] [DATE], [DATE]

CLINICAL DATA: Right-sided chest pain for 6 days

EXAM:
CHEST  2 VIEW

[chest pa]
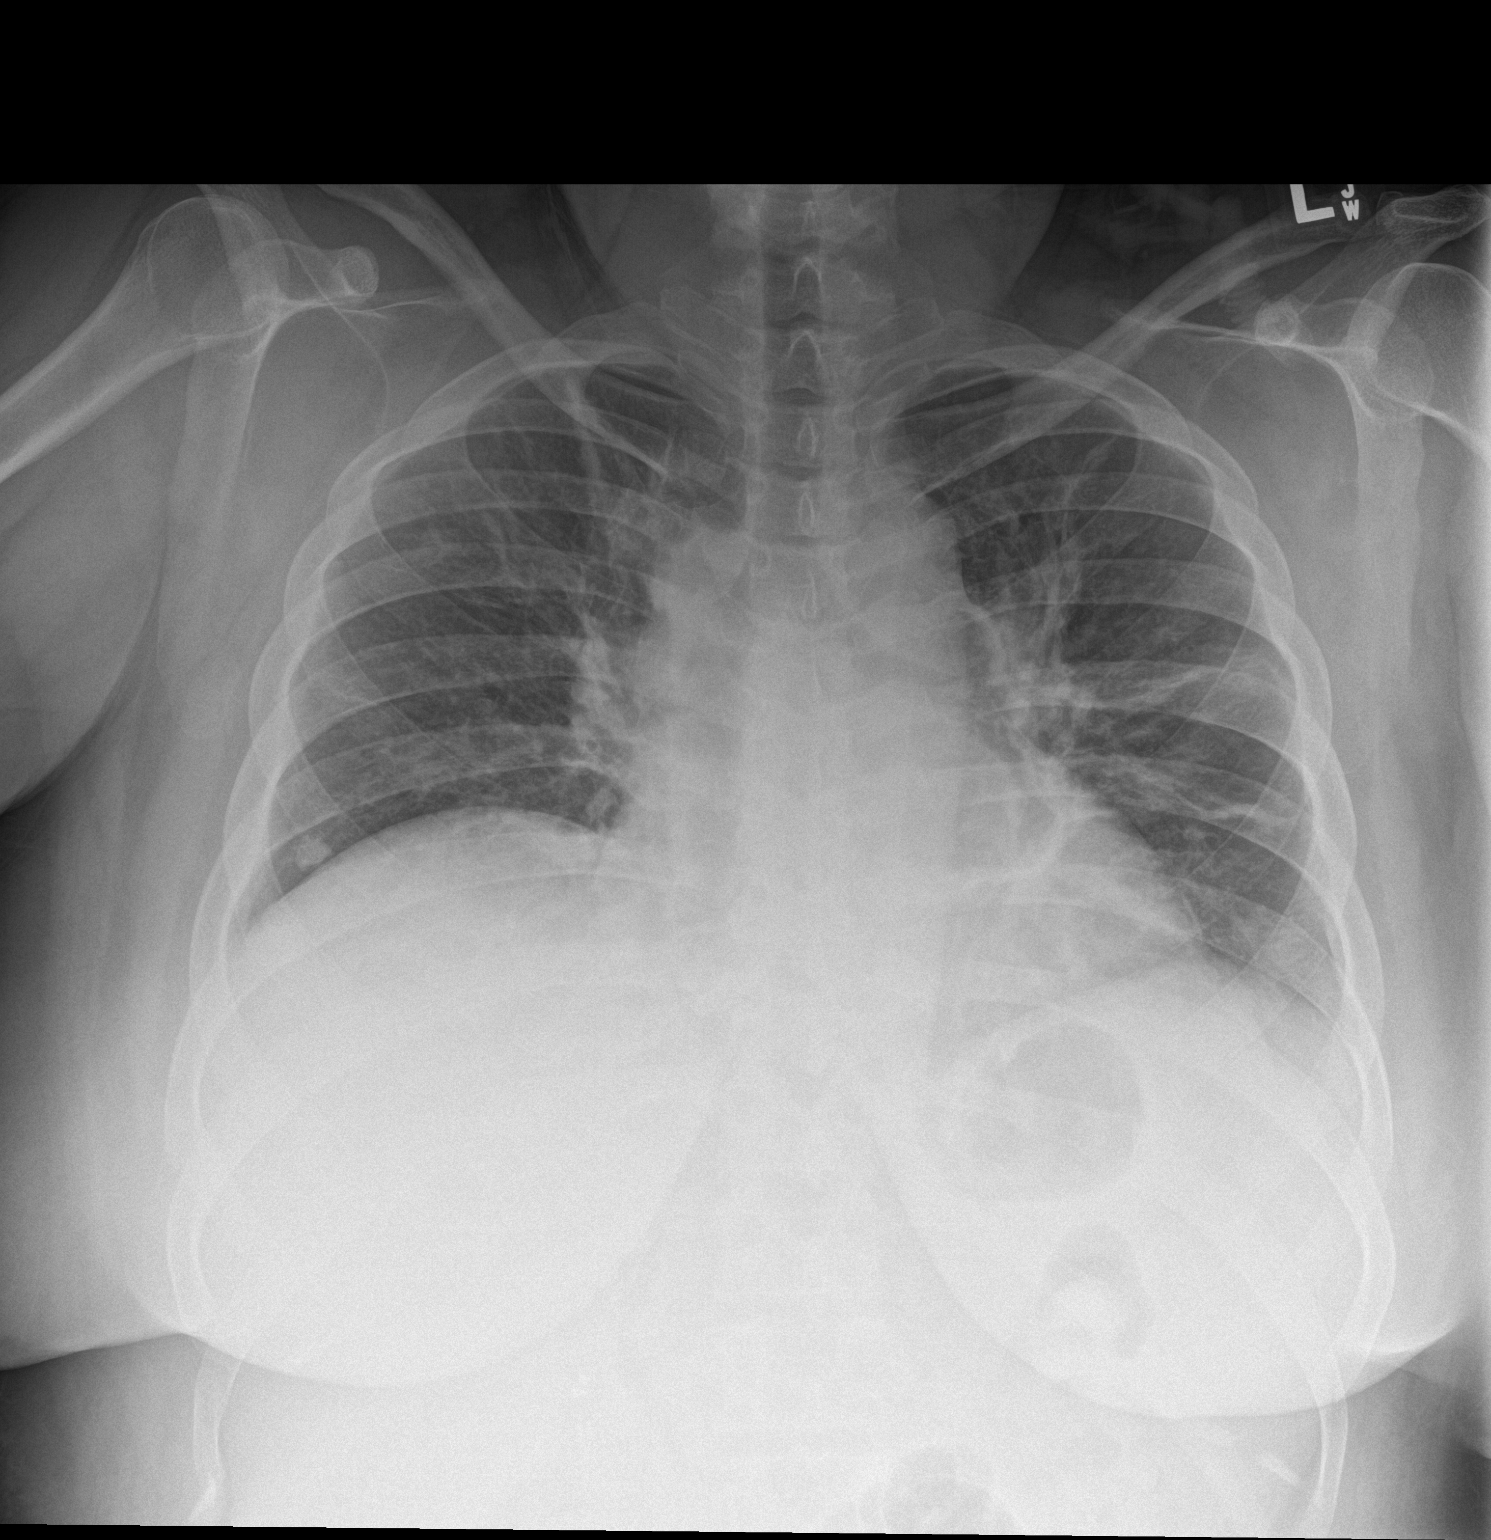

[chest lat]
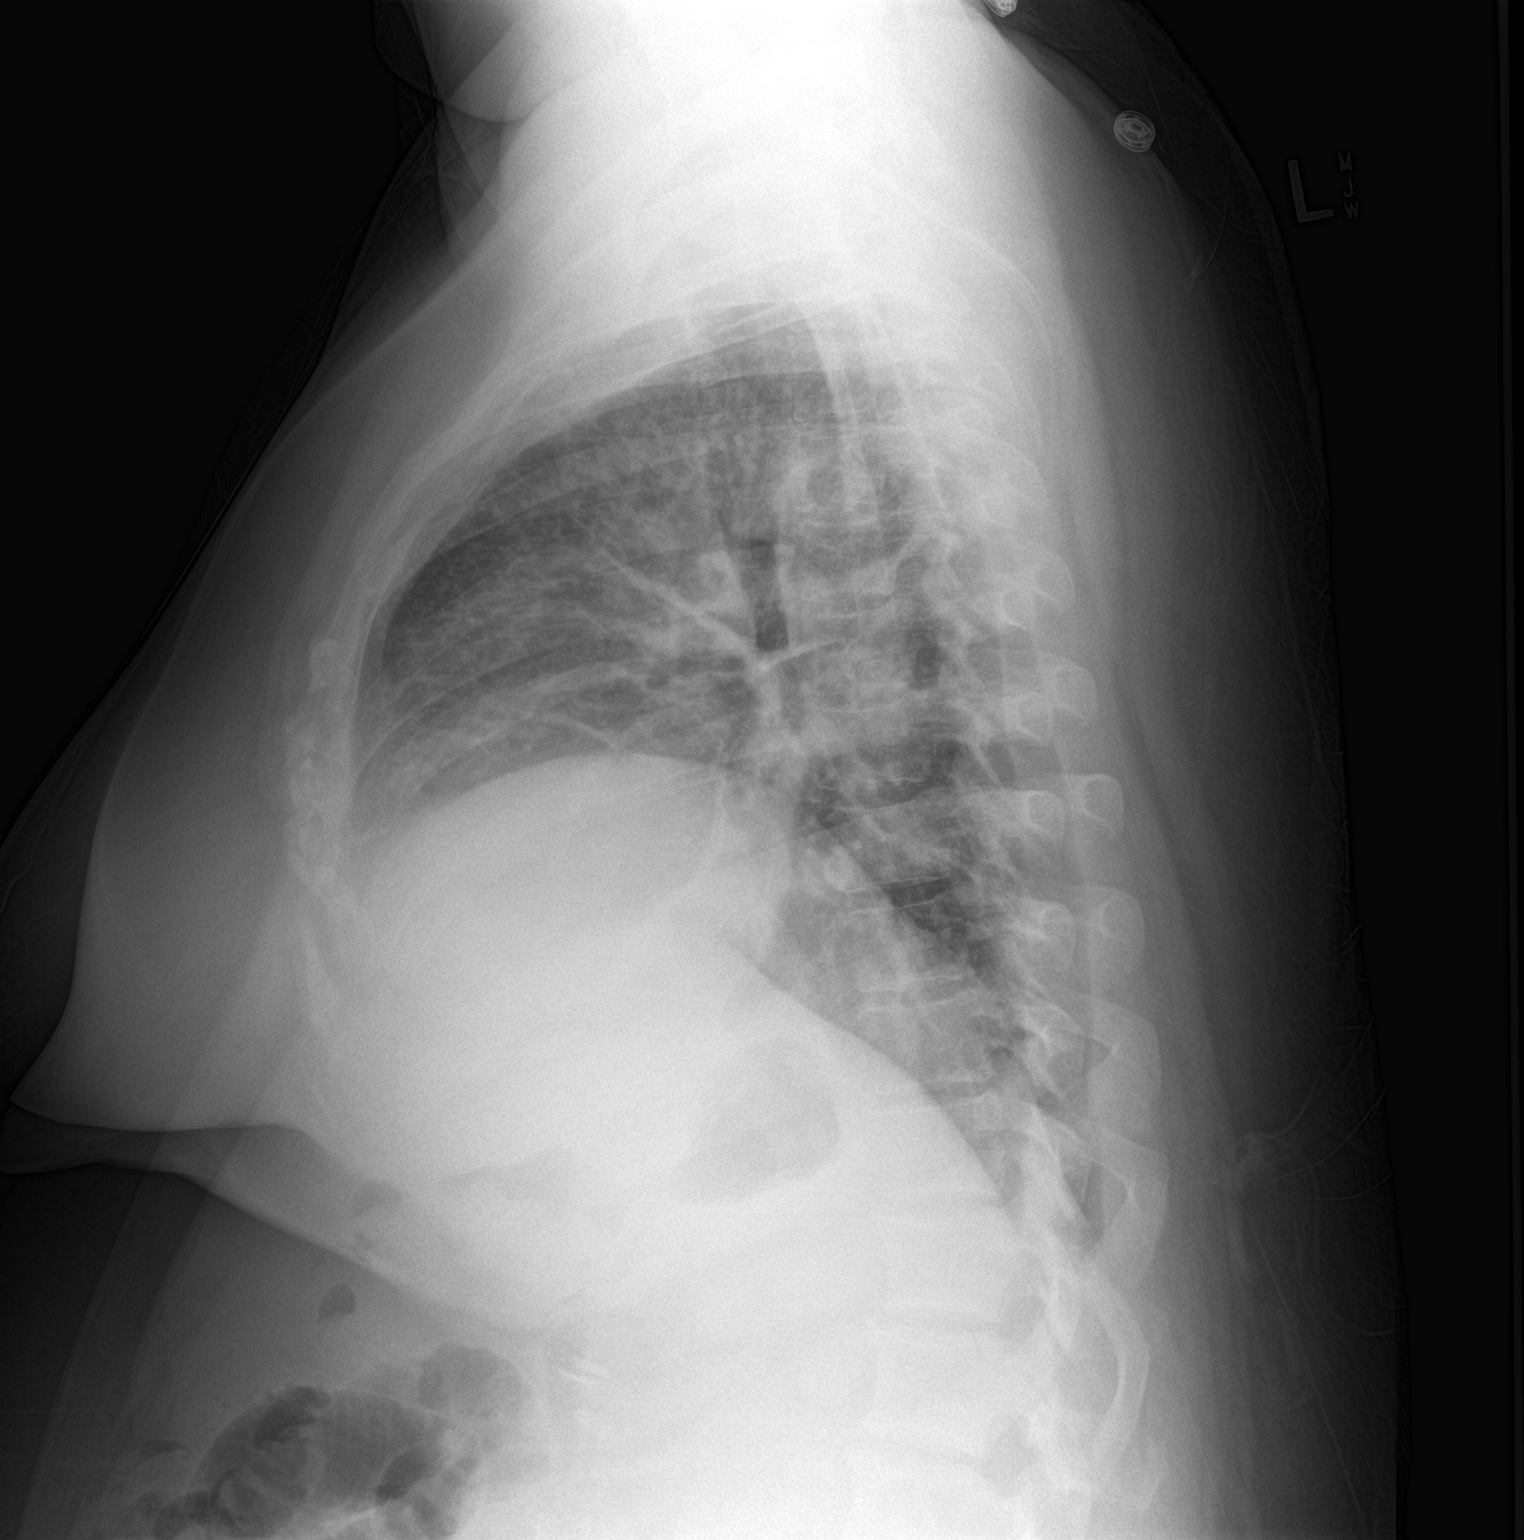

[2 of 2 positions shown; findings below may reference images not displayed]

FINDINGS: Patchy atelectasis is noted bilaterally, stable. There is a
calcified granuloma in the right base. No new opacity is seen. Heart
size is upper normal with pulmonary vascularity within normal
limits. No adenopathy. No bone lesions.
IMPRESSION: Patchy atelectasis bilaterally remain stable. No new opacity. No
change in cardiac silhouette. Calcified granuloma right base is
stable.

Given the persistence of the atelectatic change, a follow-up study
in 5-7 days advised.

## 2015-03-16 MED ORDER — FUROSEMIDE 40 MG PO TABS
40.0000 mg | ORAL_TABLET | Freq: Once | ORAL | Status: AC
  Administered 2015-03-16: 40 mg via ORAL
  Filled 2015-03-16: qty 1

## 2015-03-16 MED ORDER — TRAMADOL HCL 50 MG PO TABS: 50.0000 mg | ORAL_TABLET | Freq: Four times a day (QID) | ORAL | Status: DC | PRN

## 2015-03-16 MED ORDER — OXYCODONE HCL 5 MG PO TABS: 5.0000 mg | ORAL_TABLET | ORAL | Status: DC | PRN

## 2015-03-16 MED ORDER — METHOCARBAMOL 500 MG PO TABS: 500.0000 mg | ORAL_TABLET | Freq: Three times a day (TID) | ORAL | Status: DC | PRN

## 2015-03-16 NOTE — Telephone Encounter (Signed)
Hi  Alina  I don't schedule MRI appointments please forwarder to her pcp nurse thank you  I spoke to her last week and I told her that the [employer] discount will cover the procedure she was suppose to pick up an envelope inside is a copy of the letter she lost it .thanks

## 2015-03-16 NOTE — Telephone Encounter (Signed)
Patient was referred by the ED to [facility] off [address] for an MRI and wants to make sure her cone discount will cover. She was already referred and has an appointment tomorrow. She was just wanting to make sure since her discount expires on Wednesday.

## 2015-03-16 NOTE — Discharge Instructions (Signed)
[facility] ______[facility] °LAPAROSCOPIC SURGERY: POST OP INSTRUCTIONS °Always review your discharge instruction sheet given to you by the facility where your surgery was performed. °IF YOU HAVE DISABILITY OR FAMILY LEAVE FORMS, YOU MUST BRING THEM TO THE OFFICE FOR PROCESSING.   °DO NOT GIVE THEM TO YOUR DOCTOR. ° °1. A prescription for pain medication may be given to you upon discharge.  Take your pain medication as prescribed, if needed.  If narcotic pain medicine is not needed, then you may take acetaminophen (Tylenol) or ibuprofen (Advil) as needed. °2. Take your usually prescribed medications unless otherwise directed. °3. If you need a refill on your pain medication, please contact your pharmacy.  They will contact our office to request authorization. Prescriptions will not be filled after 5pm or on week-ends. °4. You should follow a light diet the first few days after arrival home, such as soup and crackers, etc.  Be sure to include lots of fluids daily. °5. Most patients will experience some swelling and bruising in the area of the incisions.  Ice packs will help.  Swelling and bruising can take several days to resolve.  °6. It is common to experience some constipation if taking pain medication after surgery.  Increasing fluid intake and taking a stool softener (such as Colace) will usually help or prevent this problem from occurring.  A mild laxative (Milk of Magnesia or Miralax) should be taken according to package instructions if there are no bowel movements after 48 hours. °7. Unless discharge instructions indicate otherwise, you may remove your bandages 24-48 hours after surgery, and you may shower at that time.  You may have steri-strips (small skin tapes) in place directly over the incision.  These strips should be left on the skin for 7-10 days.  If your surgeon used skin glue on the incision, you may shower in 24 hours.  The glue will flake off over the next 2-3 weeks.  Any sutures or  staples will be removed at the office during your follow-up visit. °8. ACTIVITIES:  You may resume regular (light) daily activities beginning the next day--such as daily self-care, walking, climbing stairs--gradually increasing activities as tolerated.  You may have sexual intercourse when it is comfortable.  Refrain from any heavy lifting or straining until approved by your doctor. °a. You may drive when you are no longer taking prescription pain medication, you can comfortably wear a seatbelt, and you can safely maneuver your car and apply brakes. °b. RETURN TO WORK:  __________________________________________________________ °9. You should see your doctor in the office for a follow-up appointment approximately 2-3 weeks after your surgery.  Make sure that you call for this appointment within a day or two after you arrive home to insure a convenient appointment time. °10. OTHER INSTRUCTIONS: __________________________________________________________________________________________________________________________ __________________________________________________________________________________________________________________________ °WHEN TO CALL YOUR DOCTOR: °1. Fever over 101.0 °2. Inability to urinate °3. Continued bleeding from incision. °4. Increased pain, redness, or drainage from the incision. °5. Increasing abdominal pain ° °The clinic staff is available to answer your questions during regular business hours.  Please don’t hesitate to call and ask to speak to one of the nurses for clinical concerns.  If you have a medical emergency, go to the nearest emergency room or call 911.  A surgeon from [facility] is always on call at the hospital. °[address], [address], [place], [place]  [address] ? [address] [address], [place], [place]   [address] °[phone] ? [phone] ? FAX [phone] °Web site:   [email or web address] °

## 2015-03-16 NOTE — Discharge Summary (Signed)
Patient ID: Julie Robertson MRN: [id number] DOB/AGE: Nov 07, 1975 39 y.o.  Admit date: 03/09/2015 Discharge date: 03/16/2015  Procedures: lap chole with IOC by Dr. Grandville Silos, 03-10-15 ERCP with biopsy, sphincterotomy/papillotomy, and ERCP with stent placement by Dr. Owens Loffler 03-11-15  Consults: GI  Reason for Admission: Patient is a 39 year old female with a 2 day history of abdominal pain. She states this is mainly in the right upper quadrant. The been constant over the last 2 days. This is associated with nausea and vomiting. She's had poor appetite during his last 2 days. Patient also states she has had pale-colored stools as well as darker urine.  Patient underwent ultrasound in the ER which revealed multiple gallstones and a dilated common bile duct to 12 mm. Patient also had elevated transaminases elevated bilirubin at 3.0.  Admission Diagnoses:  1. Acute cholecystitis with possible choledocholithiasis 2. DM  Hospital Course: The patient was admitted and taken to the OR the following day for cholecystitis.  She was on rocephin.  She was found to have an acute infection.  Her IOC was positive for stone vs mass.  GI was consulted and she was taken for ERCP the following day.  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  This was biopsied and a stent was able to be placed.  Her LFTs, specifically her TB began trending down after this.  She tolerated all of these procedures well.  She complained of a lot of RUQ pain post both procedures.  A lipase was order on POD 2/1.  This was negative.  Her pain was likely secondary to both procedures and possibly from spasm or pain from her stent as well.  Multiple different pain medications were added to assist with pain control.  Her diet was able to be advanced as tolerates.  We tried to get an MR abdomen w and w/o contrast with MRCP while inpatient, but due to significant atelectasis and inability to hold her breath, we had to  cancel this set this up as an outpatient.  Her biopsies returned showing no immediate malignancy or dysplasia, but malignancy deeper within the tumor could not be ruled out.  On POD 6, the patient was stable for dc home.  She will be scheduled for an MRI as an outpatient and we will have her follow up with Dr. Barry Dienes for evaluation for further management of her biliary mass.  DM: Her diabetes was initially controlled on a resistance SSI.  Once she was able to eat, she was restarted on her home medications of metformin and novolog 70/30.  Atelectasis/asthma/tobacco abuse/mild pulmonary edema: The patient developed some SOB after surgery.  She is overweight and was slouching much of the time, but was only able to pull 250-500 on her IS.  CXR was obtained that showed some mild pulmonary edema, but atelectasis as well.  She was given a dose of lasix and strongly encouraged to use IS more often, to sit up instead of slouch, and to get up and mobilize in the hallways 3-4 times a day.  Repeat CXR on day of discharge shows some improvement, but patient still with atelectasis.  She is now on RA and off her oxygen.  She still complains of some difficultly with breathing with mobilization.  She is set up to see her PCP tomorrow and we will have them follow up on this as well.  There is no evidence of PNA.  She will get one more dose of lasix today  prior to discharge to help with some peripheral edema as well.    She is otherwise stable for dc home.  PE: Abd: soft, appropriately tender, +BS, incisions c/d/i with dermabond Heart: regular Lungs: CTAB, but still with some decrease BS as posterior bases  Discharge Diagnoses:  Active Problems:   Acute cholecystitis Partially obstructing CBD neoplasm S/p lap chole S/p ERCP with biopsy and stent placement DM Atelectasis, mild pulmonary edema  Discharge Medications:   Medication List    STOP taking these medications        azithromycin 250 MG tablet   Commonly known as:  ZITHROMAX Z-PAK     cephALEXin 500 MG capsule  Commonly known as:  KEFLEX     guaiFENesin-codeine 100-10 MG/5ML syrup     magnesium hydroxide 400 MG/5ML suspension  Commonly known as:  MILK OF MAGNESIA     nicotine 14 mg/24hr patch  Commonly known as:  NICODERM CQ - dosed in mg/24 hours      TAKE these medications        albuterol 108 (90 BASE) MCG/ACT inhaler  Commonly known as:  PROVENTIL HFA;VENTOLIN HFA  Inhale 2 puffs into the lungs every 6 (six) hours as needed for wheezing or shortness of breath.     atorvastatin 10 MG tablet  Commonly known as:  LIPITOR  Take 1 tablet (10 mg total) by mouth daily.     gabapentin 300 MG capsule  Commonly known as:  NEURONTIN  Take 1 capsule (300 mg total) by mouth 3 (three) times daily.     glucose monitoring kit monitoring kit  1 each by Does not apply route 4 (four) times daily - after meals and at bedtime. 1 month Diabetic Testing Supplies for QAC-QHS accuchecks.     ibuprofen 200 MG tablet  Commonly known as:  ADVIL,MOTRIN  Take 400 mg by mouth every 6 (six) hours as needed for moderate pain.     insulin NPH-regular Human (70-30) 100 UNIT/ML injection  Commonly known as:  HUMULIN 70/30  INJECT 17 UNITS UNDER THE SKIN 2 TIMES DAILY     Insulin Pen Needle 31G X 5 MM Misc  Inject 10 units subcutaneous at bedtime     metFORMIN 1000 MG tablet  Commonly known as:  GLUCOPHAGE  Take 1 tablet (1,000 mg total) by mouth 2 (two) times daily with a meal.     methocarbamol 500 MG tablet  Commonly known as:  ROBAXIN  Take 1-2 tablets (500-1,000 mg total) by mouth every 8 (eight) hours as needed for muscle spasms.     oxyCODONE 5 MG immediate release tablet  Commonly known as:  Oxy IR/ROXICODONE  Take 1-4 tablets (5-20 mg total) by mouth every 4 (four) hours as needed for moderate pain.     traMADol 50 MG tablet  Commonly known as:  ULTRAM  Take 1 tablet (50 mg total) by mouth every 6 (six) hours as needed for  moderate pain.        Discharge Instructions:     Follow-up Information    Follow up with [facility], MD On 03/27/2015.   Specialty:  General Surgery   Why:  11:30am, arrive at 11:00am for paperwork   Contact information:   [place] [id number] [phone]       Follow up with [facility]     On 03/17/2015.   Why:  4:00pm   Contact information:   [place]  [place] [id number] [phone]      Follow up with [facility].   Contact information:   [phone] ask to speak to Ena Dawley      Signed: Corianna Avallone E 03/16/2015, 9:50 AM

## 2015-03-16 NOTE — Discharge Planning (Signed)
Discharge instructions reviewed with pt and Graciela, interpreter. Discharged home in stable condition.

## 2015-03-16 NOTE — Care Management Note (Signed)
Case Management Note  Patient Details  Name: Makalyn Lennox MRN: [id number] Date of Birth: 02-02-1976  Subjective/Objective:                    Action/Plan: [place] letter given and explained , 14 day exception for ultram , robaxin and oxycodone entered .   [facility] [employer] information given .   Above explained by interpreter . Patient voiced understanding.   Expected Discharge Date:                  Expected Discharge Plan:  Home/Self Care  In-House Referral:     Discharge planning Services  CM Consult, [facility], [facility]  Post Acute Care Choice:    Choice offered to:     DME Arranged:    DME Agency:     HH Arranged:    [place] Agency:     Status of Service:  Completed, signed off  Medicare Important Message Given:    Date Medicare IM Given:    Medicare IM give by:    Date Additional Medicare IM Given:    Additional Medicare Important Message give by:     If discussed at [place] of Stay Meetings, dates discussed:    Additional Comments:  Marilu Favre, RN 03/16/2015, 10:45 AM

## 2015-03-16 NOTE — Progress Notes (Signed)
Interpreter Lesle Chris for [employer]

## 2015-03-16 NOTE — Telephone Encounter (Signed)
I talk to her today and I explain that the cafa will work for her mri make sure she show it at registration  Thank you

## 2015-03-16 NOTE — Telephone Encounter (Signed)
Patient called stating that she needs an MRI for tomorrow but they didn't have any appointments available so they have referred her to another hospital and wants to make sure that she is covered through her insurance. Please follow up with pt.

## 2015-03-17 ENCOUNTER — Encounter: Payer: Self-pay | Admitting: Internal Medicine

## 2015-03-17 ENCOUNTER — Other Ambulatory Visit: Payer: Self-pay

## 2015-03-17 ENCOUNTER — Ambulatory Visit: Payer: Self-pay | Attending: Internal Medicine | Admitting: Internal Medicine

## 2015-03-17 ENCOUNTER — Ambulatory Visit
Admission: RE | Admit: 2015-03-17 | Discharge: 2015-03-17 | Disposition: A | Discharge status: Home / Self Care | Payer: No Typology Code available for payment source | Source: Ambulatory Visit | Attending: General Surgery | Admitting: General Surgery

## 2015-03-17 ENCOUNTER — Ambulatory Visit (HOSPITAL_COMMUNITY)
Admission: RE | Admit: 2015-03-17 | Discharge: 2015-03-17 | Disposition: A | Discharge status: Home / Self Care | Payer: No Typology Code available for payment source | Source: Ambulatory Visit | Attending: Internal Medicine | Admitting: Internal Medicine

## 2015-03-17 VITALS — BP 134/87 | HR 84 | Temp 98.0°F | Resp 16 | Ht 65.0 in | Wt 266.0 lb

## 2015-03-17 DIAGNOSIS — R1033 Periumbilical pain: Secondary | ICD-10-CM | POA: Insufficient documentation

## 2015-03-17 DIAGNOSIS — E785 Hyperlipidemia, unspecified: Secondary | ICD-10-CM | POA: Insufficient documentation

## 2015-03-17 DIAGNOSIS — R079 Chest pain, unspecified: Secondary | ICD-10-CM | POA: Insufficient documentation

## 2015-03-17 DIAGNOSIS — Z794 Long term (current) use of insulin: Secondary | ICD-10-CM | POA: Insufficient documentation

## 2015-03-17 DIAGNOSIS — D490 Neoplasm of unspecified behavior of digestive system: Secondary | ICD-10-CM

## 2015-03-17 DIAGNOSIS — E119 Type 2 diabetes mellitus without complications: Secondary | ICD-10-CM | POA: Insufficient documentation

## 2015-03-17 DIAGNOSIS — R0602 Shortness of breath: Secondary | ICD-10-CM | POA: Insufficient documentation

## 2015-03-17 LAB — GLUCOSE, POCT (MANUAL RESULT ENTRY): POC GLUCOSE: 241 mg/dL — AB (ref 70–99)

## 2015-03-17 MED ORDER — INSULIN NPH ISOPHANE & REGULAR (70-30) 100 UNIT/ML ~~LOC~~ SUSP: SUBCUTANEOUS | Status: DC

## 2015-03-17 MED ORDER — GADOBENATE DIMEGLUMINE 529 MG/ML IV SOLN
20.0000 mL | Freq: Once | INTRAVENOUS | Status: AC | PRN
Start: 2015-03-17 — End: 2015-03-17
  Administered 2015-03-17: 20 mL via INTRAVENOUS

## 2015-03-17 MED ORDER — METFORMIN HCL 1000 MG PO TABS: 1000.0000 mg | ORAL_TABLET | Freq: Two times a day (BID) | ORAL | Status: DC

## 2015-03-17 MED ORDER — ATORVASTATIN CALCIUM 10 MG PO TABS: 10.0000 mg | ORAL_TABLET | Freq: Every day | ORAL | Status: DC

## 2015-03-17 NOTE — Progress Notes (Signed)
Patient ID: Julie Robertson, female   DOB: [birth year], 39 y.o.   MRN: [id number]  CC: HFU  HPI: Julie Robertson is a 39 y.o. female here today for a follow up visit.  Patient has past medical history of T2DM, dyslipidemia, obesity, and neuropathy. Patient reports that last week she had her gallbladder removed and a biliary mass was found. She is due to have a MRI soon she will follow up with surgeon post MRI. Patient reports that she has been having SOB since her surgery that has not improved. She has been using her incentive spirometer but this has not improved her symptoms. She continues to have abdominal pain and is currently taking Oxycodone and Tramadol for pain. She reports that the pain is very strong and is most severe above her umbilicus near her surgical incision. She denies fever, chills, nausea, vomiting. Review of EMR reveals that patient may likely require a Whippel procedure pending results of MRI and cytology.   She is taking 20 units of insulin twice per day and does not check her sugars. Patient states that she does not check her sugars daily, only when she feels bad. She states that when she was in the hopsital her sugars were elevated and she is concerned she is not taking enough insulin.   She also c/o of right sided chest pain that has been present since her cholecystomy last week. She states that taking a deep breath exacerbates the chest pain and abdominal pain. Review of EMR reveals that patient had a chest xray daily while inpatient due to SOB and right sided chest pain that were all negative.    No Known Allergies Past Medical History  Diagnosis Date  . Diabetes mellitus without complication   . Dyslipidemia 08/2009  . Gall stones 08/2009  . 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  numbness tingling in right fingers. rx with Gabapentin.  . Obesity     BMI 41, 250# 03/2015   Current Outpatient Prescriptions on File Prior to Visit  Medication Sig  Dispense Refill  . albuterol (PROVENTIL HFA;VENTOLIN HFA) 108 (90 BASE) MCG/ACT inhaler Inhale 2 puffs into the lungs every 6 (six) hours as needed for wheezing or shortness of breath. 1 Inhaler 2  . atorvastatin (LIPITOR) 10 MG tablet Take 1 tablet (10 mg total) by mouth daily. (Patient taking differently: Take 10 mg by mouth daily at 6 PM. ) 90 tablet 3  . gabapentin (NEURONTIN) 300 MG capsule Take 1 capsule (300 mg total) by mouth 3 (three) times daily. 270 capsule 3  . glucose monitoring kit (FREESTYLE) monitoring kit 1 each by Does not apply route 4 (four) times daily - after meals and at bedtime. 1 month Diabetic Testing Supplies for QAC-QHS accuchecks. 1 each 1  . ibuprofen (ADVIL,MOTRIN) 200 MG tablet Take 400 mg by mouth every 6 (six) hours as needed for moderate pain.    Marland Kitchen insulin NPH-regular Human (HUMULIN 70/30) (70-30) 100 UNIT/ML injection INJECT 17 UNITS UNDER THE SKIN 2 TIMES DAILY (Patient taking differently: Inject 18 Units into the skin 2 (two) times daily with a meal. ) 30 mL 2  . Insulin Pen Needle 31G X 5 MM MISC Inject 10 units subcutaneous at bedtime 100 each 2  . metFORMIN (GLUCOPHAGE) 1000 MG tablet Take 1 tablet (1,000 mg total) by mouth 2 (two) times daily with a meal. 180 tablet 3  . methocarbamol (ROBAXIN) 500 MG tablet Take 1-2 tablets (500-1,000 mg total) by mouth every  8 (eight) hours as needed for muscle spasms. 30 tablet 0  . oxyCODONE (OXY IR/ROXICODONE) 5 MG immediate release tablet Take 1-4 tablets (5-20 mg total) by mouth every 4 (four) hours as needed for moderate pain. 60 tablet 0  . traMADol (ULTRAM) 50 MG tablet Take 1 tablet (50 mg total) by mouth every 6 (six) hours as needed for moderate pain. 40 tablet 0   No current facility-administered medications on file prior to visit.   Family History  Problem Relation Age of Onset  . Diabetes Mother   . Heart disease Mother   . Cancer Mother   . Cancer Father   . Diabetes Father   . Heart disease Father     Social History   Social History  . Marital Status: Legally Separated    Spouse Name: N/A  . Number of Children: N/A  . Years of Education: N/A   Occupational History  . Not on file.   Social History Main Topics  . Smoking status: Current Every Day Smoker -- 1.50 packs/day for 4 years    Types: Cigarettes  . Smokeless tobacco: Not on file  . Alcohol Use: Yes     Comment: socially  . Drug Use: No  . Sexual Activity: Not on file   Other Topics Concern  . Not on file   Social History Narrative   Patient has 2 sons one is 81, the other 1 as of 03/2015. Her kids are not the children of her current husband. As of 03/2015 she is not employed outside the home.    Review of Systems: Other than what is stated in HPI, all other systems are negative.   Objective:   Filed Vitals:   03/17/15 1624  BP: 134/87  Pulse: 84  Temp: 98 F (36.7 C)  Resp: 16    Physical Exam  Constitutional: She is oriented to person, place, and time.  Cardiovascular: Normal rate and regular rhythm.   Murmur heard. Pulmonary/Chest: Effort normal and breath sounds normal. She has no wheezes.  Abdominal: Soft. Bowel sounds are normal. She exhibits no distension. There is tenderness.  Neurological: She is alert and oriented to person, place, and time.  Skin: Skin is warm and dry.  Several healing surgical scars on abdomen     Lab Results  Component Value Date   WBC 10.6* 03/16/2015   HGB 7.7* 03/16/2015   HCT 26.5* 03/16/2015   MCV 70.3* 03/16/2015   PLT 354 03/16/2015   Lab Results  Component Value Date   CREATININE 0.81 03/15/2015   BUN 6 03/15/2015   NA 137 03/15/2015   K 3.3* 03/15/2015   CL 97* 03/15/2015   CO2 31 03/15/2015    Lab Results  Component Value Date   HGBA1C 9.5* 03/09/2015   Lipid Panel     Component Value Date/Time   CHOL 142 12/05/2013 0941   TRIG 113 12/05/2013 0941   HDL 35* 12/05/2013 0941   CHOLHDL 4.1 12/05/2013 0941   VLDL 23 12/05/2013 0941    LDLCALC 84 12/05/2013 0941       Assessment and plan:   Julie Robertson was seen today for follow-up.  Diagnoses and all orders for this visit:  Chest pain, unspecified chest pain type -     EKG 12-Lead EKG: normal EKG, normal sinus rhythm. Chest pain does not appear to be cardiac related. I believe pain is from abdomen and not taking deep breaths. Explained signs and symptoms that should warrant immediate attention.  Patient  verbalized understanding with teach back used.  Type 2 diabetes mellitus without complication -     Glucose (CBG) -     insulin NPH-regular Human (HUMULIN 70/30) (70-30) 100 UNIT/ML injection; INJECT 20 UNITS UNDER THE SKIN 2 TIMES DAILY -     metFORMIN (GLUCOPHAGE) 1000 MG tablet; Take 1 tablet (1,000 mg total) by mouth 2 (two) times daily with a meal.  SOB (shortness of breath) Last chest xray yesterday was normal. I have advised patient to splint abdomen and use incentive spirometer at least 10 inhalations per hour to prevent pnemonia or any other complications. Taking pain medication may help her breath better by enabling her to take deep inhalations.  Explained signs and symptoms that should warrant immediate attention.  Patient verbalized understanding with teach back used.  Periumbilical abdominal pain  From surgical site. Patient is due to have MRI tomorrow. She has a f/u with surgeon on 9/23. I have explained that she address chest pain and SOB with surgeon upon her follow up.   Hyperlipidemia -    Refill atorvastatin (LIPITOR) 10 MG tablet; Take 1 tablet (10 mg total) by mouth daily at 6 PM.  Due to language barrier, an interpreter was present during the history-taking and subsequent discussion (and for part of the physical exam) with this patient.   Return in about 1 week (around 03/24/2015) for Nurse Visit--log review and 3 mo PCP DM.       Lance Bosch, [facility] [phone] 03/17/2015, 4:33 PM

## 2015-03-17 NOTE — Progress Notes (Signed)
Patient here with interpreter Patient states this is a follow up from the hospital Patient had her gallbladder removed last Tuesday Patient states she is still having SOB but this has been since The surgery and patient does have a spirometer that she is using at home

## 2015-03-23 ENCOUNTER — Ambulatory Visit: Payer: Self-pay | Attending: Family Medicine

## 2015-03-25 ENCOUNTER — Other Ambulatory Visit (HOSPITAL_COMMUNITY): Payer: Self-pay

## 2015-03-25 ENCOUNTER — Ambulatory Visit: Payer: Self-pay | Attending: Family Medicine | Admitting: Pharmacist

## 2015-03-25 DIAGNOSIS — E119 Type 2 diabetes mellitus without complications: Secondary | ICD-10-CM | POA: Insufficient documentation

## 2015-03-25 DIAGNOSIS — Z794 Long term (current) use of insulin: Secondary | ICD-10-CM | POA: Insufficient documentation

## 2015-03-25 MED ORDER — INSULIN NPH ISOPHANE & REGULAR (70-30) 100 UNIT/ML ~~LOC~~ SUSP: SUBCUTANEOUS | Status: DC

## 2015-03-25 NOTE — Progress Notes (Signed)
S:    Patient arrives in good spirits.  Presents for diabetes follow up. [place] interpreter was used for the entirety of the visit [place] [phone] and [place]).  Patient reports adherence with medications. Current diabetes medications include Humulin 70/30 20 units BID and metformin 1000 mg BID.   Patient denies hypoglycemic events.  Patient reported dietary habits: cereal for breakfast and vegetables and chicken for dinner. Typically drinks water throughout the day and a small glass of cranberry juice.   Patient reported exercise habits: has started walking more this week but didn't do anything the previous week due to the surgery.   Patient reports nocturia 3-4 times per night.  Patient denies neuropathy. Patient reports chronic but not acute visual changes. Patient reports self foot exams.   Patient reports that she may have to have another surgery and is following up with the surgeon next week.   O:  Lab Results  Component Value Date   HGBA1C 9.5* 03/09/2015    Home fasting CBG: 127-238 2 hour post-prandial/random CBG: 155-415 (mostly 180s-200s).  A/P: Diabetes currently uncontrolled based off of A1c of 9.5 and home blood glucose log. Patient denies hypoglycemic events and is able to verbalize appropriate hypoglycemia management plan.  Patient reports adherence with medication. Control is suboptimal due to stress (recent surgery) and sedentary lifestyle.  Increased Humulin 70/30 to 22 units BID. I will slowly titrate up as patient has had recent surgery and I think as the stress of that resolves, her blood glucose will decrease and I think that it will also decrease with more walking. I also only had a week of readings and they were very labile and difficult to trend though they were all mostly above goal. Explained to patient how her insulin works and how she would closely follow with me to increase until they are at goal. Patient is working with financial aid  to renew the [employer]. Will consider changing to basal and bolus insulin if approved and once she is done with surgery and less overwhelmed.  Next A1C anticipated December 2016.    Written patient instructions provided.  Total time in face to face counseling 30 minutes.   Follow up in Pharmacist Clinic Visit in 1-2 weeks.

## 2015-03-25 NOTE — Patient Instructions (Signed)
Come back and see me in 1-2 weeks with your blood sugar log.  Recuento bsico de carbohidratos para la diabetes mellitus (Basic Carbohydrate Counting for Diabetes Mellitus) El recuento de carbohidratos es un mtodo destinado a calcular la cantidad de carbohidratos en la dieta. El consumo de carbohidratos aumenta naturalmente el nivel de azcar (glucosa) en la sangre, por lo que es importante que sepa la cantidad que debe incluir en cada comida. El recuento de carbohidratos ayuda a Advertising account executive de glucosa en la sangre dentro de los lmites normales. La cantidad permitida de carbohidratos es diferente para cada persona. Un nutricionista puede ayudarlo a calcular la cantidad adecuada para usted. Una vez que sepa la cantidad de carbohidratos que puede consumir, podr calcular los carbohidratos de los alimentos que desea comer. Los siguientes alimentos incluyen carbohidratos:  Granos, como panes y cereales.  Frijoles secos y productos con soja.  Vegetales almidonados, como papas, guisantes y maz.  Lambert Mody y jugos de frutas.  Leche y Estate agent.  Dulces y bocadillos, como pastel, galletas, caramelos, papas fritas de bolsa, refrescos y bebidas frutales con azcar. RECUENTO DE CARBOHIDRATOS [employer] de calcular los carbohidratos de los alimentos. Puede usar cualquiera de los dos mtodos o [place] combinacin de [place]. Leer la etiqueta de informacin nutricional de los alimentos envasados La informacin nutricional es una etiqueta incluida en casi todas las bebidas y los alimentos envasados de los [place]. Indica el tamao de la porcin de ese alimento o bebida e informacin sobre los nutrientes de cada porcin, incluso los gramos (g) de carbohidratos por porcin.  Decida la cantidad de porciones que comer o tomar de este alimento o bebida. Multiplique la cantidad de porciones por el nmero de gramos de carbohidratos indicados en la etiqueta para esa porcin. El total ser la cantidad de  carbohidratos que consumir al comer ese alimento o tomar esa bebida. Conocer las porciones estndar de los alimentos Cuando coma alimentos no envasados o que no incluyan la informacin nutricional en la etiqueta, deber medir las porciones para poder calcular la cantidad de carbohidratos. Una porcin de la mayora de los alimentos ricos en carbohidratos contiene alrededor de 15g de carbohidratos. La siguiente [employer] tamaos de porcin de los alimentos ricos en carbohidratos que contienen alrededor de 15g de carbohidratos por porcin:   1rebanada de pan (1oz) o 1tortilla de seis pulgadas.  panecillo de hamburguesa o bollito tipo ingls.  4a 6galletas.   de taza de cereal sin azcar y seco.   taza de cereal caliente.   de taza de arroz o pastas.  taza de pur de papas o de una papa grande al horno.  1taza de frutas frescas o una fruta pequea.  taza de frutas o jugo de frutas enlatados o congelados.  [place].   de taza de yogur descremado sin ningn agregado o de yogur endulzado con edulcorante artificial.  taza de vegetales almidonados, como guisantes, maz o papas, o de frijoles secos cocidos. Decida la cantidad de porciones Gaffer. Multiplique la cantidad de porciones por 15 (los gramos de carbohidratos en esa porcin). Por ejemplo, si come 2tazas de fresas, habr comido 2porciones y 30g de carbohidratos (2porciones x 15g = 30g). [place] y guisos, en las que se mezcla ms de un alimento, deber [employer] carbohidratos de cada alimento incluido. EJEMPLO DE RECUENTO DE CARBOHIDRATOS Ejemplo de cena  3 onzas de pechugas de pollo.   de taza de arroz integral.  taza de [place].  1 taza de fresas con crema batida sin azcar. Clculo de [place] 1: Identifique los alimentos que contienen carbohidratos:   Arroz.  Maz.  Leche.  Hughie Closs. [place] 2: Calcule el nmero de  porciones que consumir de cada uno:   2 porciones de Occupational psychologist.  1 porcin de maz.  [place].  1 porcin de fresas. [place] 3: Multiplique cada una de esas porciones por 15g:   2 porciones de arroz x 15 g = 30 g.  1 porcin de maz x 15 g = 15 g.  1 porcin de leche x 15 g = 15 g.  1 porcin de fresas x 15 g = 15 g. [place] 4: Sume todas las cantidades para Armed forces logistics/support/administrative officer total de gramos de carbohidratos consumidos: 30 g + 15 g + 15 g + 15 g = 75 g. Document Released: 09/12/2011 Document Revised: 11/04/2013 [facility] Patient Information 2015 [place]. This information is not intended to replace advice given to you by your health care provider. Make sure you discuss any questions you have with your health care provider. Diabetes y Kandace Blitz fsica (Diabetes and Exercise) Hacer actividad fsica con regularidad es muy importante. No se trata solo de [employer]. Tiene muchos otros beneficios, como por ejemplo:  Mejorar el estado fsico, la flexibilidad y la resistencia.  Aumenta la densidad sea.  Ayuda a Technical sales engineer.  Disminuye la Air traffic controller.  Aumenta la fuerza muscular.  Reduce el estrs y las tensiones.  Mejora el estado de salud general. Las personas diabticas que realizan actividad fsica tienen beneficios adicionales debido al ejercicio:  Reduce el apetito.  El organismo mejora el uso del azcar (glucosa) de la [place].  Ayuda a disminuir o Product/process development scientist.  Disminuye la presin arterial.  Ayuda a disminuir los lpidos en la sangre (colesterol y triglicridos).  El organismo mejora el uso de la insulina porque:  Aumenta la sensibilidad del organismo a la insulina.  Reduce las necesidades de insulina del organismo.  Disminuye el riesgo de enfermedad cardaca por la actividad fsica ya que  disminuye el colesterol y [employer] triglicridos.  Aumenta los niveles de colesterol bueno (como las lipoprotenas de alta densidad  [HDL]) en el organismo.  Disminuye los niveles de glucosa en la [place]. SU PLAN DE ACTIVIDAD  Elija una actividad que disfrute y establezca objetivos realistas. Su mdico o educador en diabetes podrn ayudarlo a encontrar una actividad que lo beneficie. Haga ejercicio regularmente como se lo haya indicado el mdico. Esto incluye:  Hacer entrenamiento de [employer] a la semana, como flexiones, sentadillas, levantar peso o usar bandas de resistencia.  Practicar 119minutos de ejercicios cardiovasculares cada semana, como caminar, correr o hacer algn deporte.  Mantenerse activo y no permanecer inactivo durante ms de 6minutos seguidos. Los perodos cortos de [place] tambin son beneficiosos. Tres sesiones de 30minutos a lo largo del da son tan beneficiosas como una sola sesin de 55minutos. Estas son algunas ideas para los ejercicios:  Lleve a Probation officer.  Utilice las Clinical cytogeneticist del ascensor.  Baile su cancin favorita.  Haga los ejercicios de un video de ejercicios.  Haga sus ejercicios favoritos con Gaffer. RECOMENDACIONES PARA REALIZAR EJERCICIOS CUANDO SE TIENE DIABETES TIPO 1 O TIPO 2   Controle la glucosa en la sangre antes de comenzar. Si el nivel de glucosa en la sangre es de ms de 240 mg/dl, controle las  cetonas en la orina. No haga actividad fsica si hay cetonas.  Evite inyectarse insulina en las zonas del cuerpo que ejercitar. Por ejemplo, evite inyectarse insulina en:  Los brazos, si juega al tenis.  Las piernas, si corre.  Lleve un registro de:  Los alimentos que consume antes y despus de TEFL teacher.  Los momentos esperables de picos de accin de la insulina.  Los niveles de glucosa en la sangre antes y despus de hacer ejercicios.  El tipo y cantidad de [place] fsica que Musician.  Revise los registros con su mdico. El mdico lo ayudar a Actor pautas para ajustar la cantidad de alimento y las cantidades de  insulina antes y despus de Field seismologist ejercicios.  Si toma insulina o agentes hipoglucemiantes por va oral, observe si hay signos y sntomas de hipoglucemia. Entre los que se incluyen:  Mareos.  Temblores.  Sudoracin.  Escalofros.  Confusin.  Beba gran cantidad de agua mientras hace ejercicios para evitar la deshidratacin o los golpes de Freight forwarder. Durante la actividad fsica se pierde agua corporal que se debe reponer.  Comente con su mdico antes de comenzar un programa de actividad fsica para verificar que sea seguro para usted. Recuerde, cualquier actividad es mejor que ninguna. Document Released: 07/10/2007 Document Revised: 11/04/2013 [facility] Patient Information 2015 [place], [place]. This information is not intended to replace advice given to you by your health care provider. Make sure you discuss any questions you have with your health care provider. Hipoglucemia (Hypoglycemia) La hipoglucemia se produce cuando el nivel de glucosa en la sangre es demasiado bajo. La glucosa es un tipo de azcar, que es la principal fuente de energa del cuerpo. Hormonas, como la insulina y Secretary/administrator, Probation officer el nivel de glucosa en la [place]. La insulina reduce el nivel de la glucosa en la sangre, mientras que el glucagn lo [place]. Si tiene demasiada insulina en el torrente sanguneo o si no ingiere suficientes alimentos que contengan azcar, puede desarrollar hipoglucemia. Esta afeccin puede manifestarse en personas con o sin diabetes. Puede desarrollarse rpidamente y, como consecuencia, necesitar atencin urgente.  CAUSAS   Omitir o retrasar comidas.  No ingerir demasiados carbohidratos en las comidas.  Consumo excesivo de medicamentos para la diabetes.  No coordinar el horario de la toma de medicamentos por va oral para la diabetes o de insulina, con las comidas, las colaciones y Adult nurse.  Nuseas y vmitos.  Algunos medicamentos.  Enfermedades graves, como hepatitis,  trastornos renales y ciertos trastornos de Youth worker.  Aumento de la actividad fsica o el ejercicio, sin ingerir alimentos adicionales o ajustar los medicamentos.  Beber alcohol en exceso.  Un trastorno nervioso que afecta las funciones corporales, como la frecuencia cardaca, presin arterial y digestin (neuropata [place]).  Una afeccin en la cual los msculos del estmago no funcionan apropiadamente (gastroparesia). Por consiguiente, los medicamentos y los alimentos no pueden absorberse Personal assistant.  Pocas veces un tumor de pncreas puede producir demasiada insulina. SNTOMAS   Hambre.  Sudoraciones (diaforesis).  Cambio en la Firefighter.  Temblores.  Dolor de [place].  Ansiedad.  Aturdimiento.  Irritabilidad.  Dificultad para concentrarse.  Sequedad en la boca.  Hormigueo o adormecimiento de las manos y los pies.  Sueo agitado o alteraciones del sueo.  Alteracin en el habla y la coordinacin.  Cambio en el estado mental.  Convulsiones breves o prolongadas.  Agresividad  Somnolencia (letargo).  Debilidad.  Aumento de la frecuencia cardaca o palpitaciones.  Confusin.  Piel plida o de  tono gris.  Visin borrosa o doble.  Desmayos. DIAGNSTICO  Le harn un examen fsico y [place] historia clnica. Su mdico puede hacer un diagnstico en funcin de sus sntomas. Pueden realizarle anlisis de sangre y otras pruebas de laboratorio para Physicist, medical diagnstico. Una vez realizado el diagnstico, su mdico observar si los signos y sntomas desaparecen, una vez que aumenta el nivel de la glucosa en la [place].  TRATAMIENTO  Por lo general, la hipoglucemia puede tratarse fcilmente cuando se observan sntomas.  Controle su nivel de glucosa en la sangre. Si es menor que 70 mg/dl, tome uno de los siguientes:  3 o 4 comprimidos de glucosa.   taza de jugo.   taza de una gaseosa comn.  [place]   a 1 pomo de  glucosa en gel.  5 a 6 caramelos duros.  Evite las bebidas o los alimentos con alto contenido de grasa, que pueden retrasar el aumento de los niveles de glucosa en la [place].  No ingiera ms de la cantidad recomendada de alimentos, bebidas, gel o comprimidos que contengan azcar. Si lo hace, el nivel de glucosa en la sangre subir demasiado.  Espere de 10 a 15 minutos y vuelva a Chief Technology Officer su nivel de glucosa en la sangre. Si an es Scientist, product/process development 70 mg/dl o est por debajo del intervalo indicado, repita el tratamiento.  Ingiera una colacin si falta ms de 1 hora para su prxima comida. Es posible que, alguna vez, su nivel de glucosa en la sangre baje [place], de modo que no pueda tratarse en su casa, cuando comience a observar los sntomas. Probablemente necesite ayuda. Incluso puede desmayarse o ser incapaz de tragar. Si no puede tratarse por s solo, alguien Software engineer al hospital.  INSTRUCCIONES PARA EL CUIDADO EN EL HOGAR  Si tiene diabetes, siga su plan de control de la diabetes:  Tome los medicamentos segn las indicaciones.  Siga el plan de ejercicio.  Siga el plan de comidas. No saltee comidas. Coma a horario.  Controle su nivel de glucosa en la sangre peridicamente. Controle su nivel de glucosa en la sangre antes y despus de ejercitarse. Si hace ejercicio durante ms tiempo o de [employer] de lo habitual, asegrese de Chief Technology Officer su nivel de glucosa en la sangre con mayor frecuencia.  Use su pulsera o medalla de alerta mdica, que indica que usted tiene diabetes.  Identifique la causa de su hipoglucemia. Luego, desarrolle formas de prevenir la recurrencia de la hipoglucemia.  No tome un bao o una ducha caliente inmediatamente despus de una inyeccin de insulina.  Siempre lleve [facility]. Las pastillas de glucosa son fciles de Catering manager.  Si va a beber alcohol, bbalo solo con las comidas.  Informe a familiares y amigos qu pueden hacer para mantenerlo  seguro durante una convulsin. Esto puede incluir retirar [employer] duros o filosos del rea o colocarlo de costado.  Mantenga un peso saludable. SOLICITE ATENCIN MDICA SI:   Tiene problemas para Advertising account executive de glucosa en la sangre dentro del intervalo indicado.  Tiene episodios frecuentes de hipoglucemia.  Siente efectos secundarios por los medicamentos prescritos.  No est seguro por qu su nivel de glucosa en la sangre es tan bajo.  Nota cambios o un nuevo problema en la visin . SOLICITE ATENCIN MDICA DE INMEDIATO SI:   Presenta confusin.  Se produce un cambio en su estado mental.  Es incapaz de tragar.  Se desmaya. Document Released: 06/20/2005 Document Revised: 06/25/2013 ExitCare  Patient Information 2015 [employer]. This information is not intended to replace advice given to you by your health care provider. Make sure you discuss any questions you have with your health care provider.  

## 2015-03-27 ENCOUNTER — Other Ambulatory Visit: Payer: Self-pay | Admitting: General Surgery

## 2015-03-27 DIAGNOSIS — R0781 Pleurodynia: Secondary | ICD-10-CM

## 2015-03-30 ENCOUNTER — Ambulatory Visit
Admission: RE | Admit: 2015-03-30 | Discharge: 2015-03-30 | Disposition: A | Discharge status: Home / Self Care | Payer: No Typology Code available for payment source | Source: Ambulatory Visit | Attending: General Surgery | Admitting: General Surgery

## 2015-03-30 MED ORDER — IOPAMIDOL (ISOVUE-370) INJECTION 76%
200.0000 mL | Freq: Once | INTRAVENOUS | Status: AC | PRN
  Administered 2015-03-30: 200 mL via INTRAVENOUS

## 2015-04-01 ENCOUNTER — Encounter: Payer: Self-pay | Admitting: Pharmacist

## 2015-04-08 ENCOUNTER — Ambulatory Visit: Payer: Self-pay | Attending: Internal Medicine | Admitting: Pharmacist

## 2015-04-08 DIAGNOSIS — E119 Type 2 diabetes mellitus without complications: Secondary | ICD-10-CM | POA: Insufficient documentation

## 2015-04-08 DIAGNOSIS — Z7984 Long term (current) use of oral hypoglycemic drugs: Secondary | ICD-10-CM | POA: Insufficient documentation

## 2015-04-08 DIAGNOSIS — Z794 Long term (current) use of insulin: Secondary | ICD-10-CM | POA: Insufficient documentation

## 2015-04-08 MED ORDER — TRUE METRIX METER W/DEVICE KIT: PACK | Status: DC

## 2015-04-08 MED ORDER — TRUEPLUS LANCETS 28G MISC: Status: DC

## 2015-04-08 MED ORDER — INSULIN NPH ISOPHANE & REGULAR (70-30) 100 UNIT/ML ~~LOC~~ SUSP: 25.0000 [IU] | Freq: Two times a day (BID) | SUBCUTANEOUS | Status: DC

## 2015-04-08 MED ORDER — INSULIN NPH ISOPHANE & REGULAR (70-30) 100 UNIT/ML ~~LOC~~ SUSP: SUBCUTANEOUS | Status: DC

## 2015-04-08 MED ORDER — GLUCOSE BLOOD VI STRP: ORAL_STRIP | Status: DC

## 2015-04-08 NOTE — Progress Notes (Signed)
S:    Patient arrives in good spirits.  Presents for diabetes follow up. [place] interpreter was used for the entirety of the visit Julie Robertson [phone])  Patient reports adherence with medications. Current diabetes medications include Humulin 70/30 22 units BID and metformin 1000 mg BID.   Patient denies hypoglycemic events.  Patient reported dietary habits: patient drinks orange juice at home - 8 oz, about 1-2 days a week. She was not aware there were a lot of carbs in the juice.   Patient reported exercise habits: no much exercise due to recent surgery.   Patient reports nocturia 1-2 times per night.  Patient denies neuropathy. Patient reports chronic but not acute visual changes. Patient reports self foot exams.   Patient reports that her meter broke last week after a family member spilled juice on it. She has been borrowing a friend's meter the last few days.  Patient is not sure if she will have more surgery soon. She has not heard from the surgeons office.   O:  Lab Results  Component Value Date   HGBA1C 9.5* 03/09/2015   Home fasting CBG: 127 - 197, one excursion to 247 2 hour post-prandial/random CBG: 116 - 225, two excursions to 253, 364  A/P: Diabetes currently uncontrolled based off of A1c of 9.5 but improving based on home readings. Patient denies hypoglycemic events and is able to verbalize appropriate hypoglycemia management plan.  Patient reports adherence with medication. Control is suboptimal due to stress (recent surgery) and sedentary lifestyle.  Increased Humulin 70/30 to 25 units BID. Continue metformin 1000 mg BID. Will continue to titrate to goal with insulin with goal fasting blood glucoses <120 and goal post-prandial blood glucoses <180.Continue to monitor blood glucoses at home and record on log. Discussed how juice has a lot of sugar in it and how it can raise blood glucose - which is why it is good to have when blood glucose is too low. New  meter and strips have been ordered.   Next A1C anticipated December 2016.    Written patient instructions provided. Total time in face to face counseling 30 minutes.   Follow up in Pharmacist Clinic Visit in 2 weeks for insulin titration.

## 2015-04-08 NOTE — Patient Instructions (Addendum)
Thanks for coming to see me today!  Increase insulin to 25 units 2 times a day.  Come back and see me in 2 weeks.   Hipoglucemia (Hypoglycemia) Un bajo nivel de azcar en la sangre (hipoglucemia) implica que es menor de lo que debera ser. Algunos signos de hipoglucemia son:  Transpiracin abundante.  Aumento del apetito.  Sensacin de [employer] o debilidad.  Sentir ms sueo que lo habitual.  Nerviosismo.  Dolor de [place].  Pulsaciones rpidas. El nivel de azcar en sangre puede descender rpidamente y puede ser Engineer, maintenance (IT). Su mdico puede hacerle estudios para Public house manager. Puede ser que tenga hipoglucemia y no sea diabtico. CUIDADOS EN EL HOGAR  Controle el nivel de azcar en la sangre como lo indique su mdico. Si es menor de 70 mg / dl , o el que le indique su mdico, tome una de las siguientes medidas:  3  4 tabletas de glucosa.   taza de jugo liviano.   taza de gaseosa que no sea diettica.  [place]  5  6 caramelos duros.  Vuelva a controlarse despus de 15 minutos. Repita hasta que el nivel sea el [place].  Tome una colacin si falta ms de 1 hora hasta la prxima comida.  Tome los medicamentos tal como se los prescribi el mdico.  No saltee las comidas. Coma siempre a la misma hora.  Debe evitar las bebidas alcohlicas, excepto con las comidas.  Controle su nivel de glucosa antes de conducir.  Controle su nivel de glucosa antes y despus de hacer actividad fsica.  Lleve siempre los medicamentos con usted,como por ejemplo las pastillas para la glucosa.  Siempre use un brazalete de alerta mdica si es diabtico. SOLICITE AYUDA DE INMEDIATO SI:   El nivel de glucosa desciende por debajo de 70 mg / dl o el que le indique su mdico, y:  Se siente confundido  No puede tragar.  Se desmaya.  No puede solucionarlo por sus propios medios. Puede ser necesario que alguien lo ayude.  Este problema le ocurre con  frecuencia.  Tiene algn problema con los medicamentos.  No se siente bien despus de 3  4 das.  Observa cambios en la visin. ASEGRESE DE QUE:   Comprende estas instrucciones.  Controlar la enfermedad.  Solicitar ayuda de inmediato si no mejora o empeora.   Esta informacin no tiene Marine scientist el consejo del mdico. Asegrese de hacerle al mdico cualquier pregunta que tenga.   Document Released: 07/23/2010 Document Revised: 07/11/2014 Elsevier Interactive Patient Education [employer].

## 2015-04-09 ENCOUNTER — Other Ambulatory Visit: Payer: Self-pay | Admitting: Pharmacist

## 2015-04-09 MED ORDER — INSULIN NPH ISOPHANE & REGULAR (70-30) 100 UNIT/ML ~~LOC~~ SUSP: 25.0000 [IU] | Freq: Two times a day (BID) | SUBCUTANEOUS | Status: DC

## 2015-04-22 ENCOUNTER — Encounter: Payer: No Typology Code available for payment source | Admitting: Pharmacist

## 2015-04-23 ENCOUNTER — Ambulatory Visit: Payer: Self-pay | Attending: Internal Medicine | Admitting: Pharmacist

## 2015-04-23 DIAGNOSIS — Z794 Long term (current) use of insulin: Secondary | ICD-10-CM | POA: Insufficient documentation

## 2015-04-23 DIAGNOSIS — E119 Type 2 diabetes mellitus without complications: Secondary | ICD-10-CM | POA: Insufficient documentation

## 2015-04-23 DIAGNOSIS — Z7984 Long term (current) use of oral hypoglycemic drugs: Secondary | ICD-10-CM | POA: Insufficient documentation

## 2015-04-23 MED ORDER — INSULIN NPH ISOPHANE & REGULAR (70-30) 100 UNIT/ML ~~LOC~~ SUSP: SUBCUTANEOUS | Status: DC

## 2015-04-23 NOTE — Patient Instructions (Addendum)
Take 26 units of insulin in the morning and 30 units of insulin at night  Come back and see me in two weeks   Hipoglucemia (Hypoglycemia) Un bajo nivel de azcar en la sangre (hipoglucemia) implica que es menor de lo que debera ser. Algunos signos de hipoglucemia son:  Transpiracin abundante.  Aumento del apetito.  Sensacin de [employer] o debilidad.  Sentir ms sueo que lo habitual.  Nerviosismo.  Dolor de [place].  Pulsaciones rpidas. El nivel de azcar en sangre puede descender rpidamente y puede ser Engineer, maintenance (IT). Su mdico puede hacerle estudios para Public house manager. Puede ser que tenga hipoglucemia y no sea diabtico. CUIDADOS EN EL HOGAR  Controle el nivel de azcar en la sangre como lo indique su mdico. Si es menor de 70 mg / dl , o el que le indique su mdico, tome una de las siguientes medidas:  3  4 tabletas de glucosa.   taza de jugo liviano.   taza de gaseosa que no sea diettica.  [place]  5  6 caramelos duros.  Vuelva a controlarse despus de 15 minutos. Repita hasta que el nivel sea el [place].  Tome una colacin si falta ms de 1 hora hasta la prxima comida.  Tome los medicamentos tal como se los prescribi el mdico.  No saltee las comidas. Coma siempre a la misma hora.  Debe evitar las bebidas alcohlicas, excepto con las comidas.  Controle su nivel de glucosa antes de conducir.  Controle su nivel de glucosa antes y despus de hacer actividad fsica.  Lleve siempre los medicamentos con usted,como por ejemplo las pastillas para la glucosa.  Siempre use un brazalete de alerta mdica si es diabtico. SOLICITE AYUDA DE INMEDIATO SI:   El nivel de glucosa desciende por debajo de 70 mg / dl o el que le indique su mdico, y:  Se siente confundido  No puede tragar.  Se desmaya.  No puede solucionarlo por sus propios medios. Puede ser necesario que alguien lo ayude.  Este problema le ocurre con frecuencia.  Tiene  algn problema con los medicamentos.  No se siente bien despus de 3  4 das.  Observa cambios en la visin. ASEGRESE DE QUE:   Comprende estas instrucciones.  Controlar la enfermedad.  Solicitar ayuda de inmediato si no mejora o empeora.   Esta informacin no tiene Marine scientist el consejo del mdico. Asegrese de hacerle al mdico cualquier pregunta que tenga.   Document Released: 07/23/2010 Document Revised: 07/11/2014 Elsevier Interactive Patient Education [employer].

## 2015-04-23 NOTE — Progress Notes (Signed)
S:    Patient arrives in good spirits.  Presents for diabetes follow up. [place] interpreter was used for the entirety of the visit Julie Robertson [phone])  Patient reports adherence with medications. Current diabetes medications include Humulin 70/30 26 units BID and metformin 1000 mg BID. She reports that it was hard to see on the syringe where 25 was so she has been giving herself 26 units instead.  Patient denies hypoglycemic events.  Patient reported exercise habits: still not exercising   Patient reports nocturia 1-2 times per night.  Patient denies neuropathy. Patient reports chronic but not acute visual changes. Patient reports self foot exams.   Patient is waiting to see if she can get approved for financial aid to receive her next surgery.   O:  Lab Results  Component Value Date   HGBA1C 9.5* 03/09/2015   Home fasting CBG: 125 - 238 (most <190) 2 hour post-prandial/random CBG: 144 - 364 (most <220)  A/P: Diabetes currently uncontrolled based off of A1c of 9.5 and the last increase in insulin did not seem to change her readings much. Patient denies hypoglycemic events and is able to verbalize appropriate hypoglycemia management plan.  Patient reports adherence with medication. Control is suboptimal due to stress (recent surgery) and sedentary lifestyle.  Increased Humulin 70/30 to 26 units in the morning and 30 units in the evening with the focus of getting the fasting blood glucoses to goal first and then focusing on post-prandials. Continue metformin 1000 mg BID. Will continue to titrate to goal with insulin with goal fasting blood glucoses <120 and goal post-prandial blood glucoses <180. Continue to monitor blood glucoses at home and record on log.   Next A1C anticipated December 2016.    Written patient instructions provided. Total time in face to face counseling 30 minutes.   Follow up in Pharmacist Clinic Visit in 2 weeks for insulin titration.

## 2015-05-06 ENCOUNTER — Encounter: Payer: No Typology Code available for payment source | Admitting: Pharmacist

## 2015-05-12 ENCOUNTER — Ambulatory Visit: Payer: Self-pay | Attending: Internal Medicine | Admitting: Pharmacist

## 2015-05-12 DIAGNOSIS — Z794 Long term (current) use of insulin: Secondary | ICD-10-CM | POA: Insufficient documentation

## 2015-05-12 DIAGNOSIS — E119 Type 2 diabetes mellitus without complications: Secondary | ICD-10-CM | POA: Insufficient documentation

## 2015-05-12 MED ORDER — INSULIN NPH ISOPHANE & REGULAR (70-30) 100 UNIT/ML ~~LOC~~ SUSP: SUBCUTANEOUS | Status: DC

## 2015-05-12 NOTE — Progress Notes (Signed)
S:    Patient arrives in good spirits.  Presents for diabetes follow up. A virtual [place] Spanish interpreter was used for the entirety of the visit Julie Robertson [phone])  Patient reports adherence with medications. Current diabetes medications include Humulin 70/30 30 units BID and metformin 1000 mg BID. She reports that she forgot what she was supposed to take and has been taking 30 units twice a day.  Patient denies hypoglycemic events.  Patient reported exercise habits: still not exercising   Patient reports nocturia 1 times per night.  Patient denies neuropathy. Patient reports chronic but not acute visual changes. Patient reports self foot exams.   Patient is still waiting to see if she can get approved for financial aid to receive her next surgery.   Patient believes that she has an infection in her throat. It is sore but she denies fever. She would like an antibiotic.   O:  Lab Results  Component Value Date   HGBA1C 9.5* 03/09/2015   Home fasting CBG: 113-187  2 hour post-prandial/random CBG: 89-167  A/P: Diabetes currently uncontrolled based off of A1c of 9.5 and home CBG readings. Post-prandials are at goal of <180. Still not at goal with fasting blood glucoses. Patient denies hypoglycemic events and is able to verbalize appropriate hypoglycemia management plan.  Patient reports adherence with medication. Control is suboptimal due to stress (recent surgery) and sedentary lifestyle.  Increased Humulin 70/30 to 30 units in the morning and 32 units in the evening with the focus of getting the fasting blood glucoses to goal. Patient verbalized dosing and I hope that she will not get confused when she goes home. She may benefit from the same dose twice a day so I may try that at the next visit if her post-prandial readings allow. Continue metformin 1000 mg BID. Continue to monitor blood glucoses at home and record on log.   Patient with mild sore throat. Denies fever and chills. Asked  patient to follow up with primary care provider if it continues - I think it is a cold and an antibiotic is not warranted at this time. Patient to make appointment with Chari Manning, NP.   Next A1C anticipated December 2016.    Written patient instructions provided. Total time in face to face counseling 30 minutes.  Follow up in Pharmacist Clinic Visit in 2 weeks for insulin titration.

## 2015-05-12 NOTE — Patient Instructions (Addendum)
Increase your night time dose of insulin to 32 units   Come back and see me in 2 weeks for CBG log review.  Control del nivel de glucosa en la sangre - Adultos (Blood Glucose Monitoring, Adult) El control de la glucosa en la sangre (tambin llamada azcar en la sangre) lo ayudar a tener la diabetes bajo control. Tambin ayuda a que usted y Chief Financial Officer la diabetes y determinen si el tratamiento es Armed forces logistics/support/administrative officer. POR QU HAY QUE CONTROLAR LA GLUCOSA EN LA SANGRE?  Esto puede ayudar a comprender de [employer], la actividad fsica y los medicamentos inciden en los niveles de [place].  Le permite conocer el nivel de glucosa en la sangre en cualquier momento dado. Puede saber rpidamente si el nivel es bajo (hipoglucemia) o alto (hiperglucemia).  Puede ser de ayuda para que usted y el mdico sepan cmo Water engineer,  y para entender cmo controlar una enfermedad o ajustar los medicamentos para hacer ejercicio. CUNDO DEBE HACERSE LAS PRUEBAS? El mdico lo ayudar a decidir con qu frecuencia deber [employer] niveles de glucosa en la [place]. Esto puede depender del tipo de diabetes que tenga, su control de la diabetes o los tipos de medicamentos que tome. Asegrese de anotar todos los valores de la glucosa en la [place], de modo que esta informacin pueda ser revisada por su mdico. A continuacin puede ver ejemplos de los momentos para Optometrist la prueba que el mdico puede Event organiser. Diabetes tipo1  Mdaselo al menos 2 veces al da si la diabetes est bien controlada, si [place] una bomba de insulina o si se aplica muchas inyecciones diarias.  Si la diabetes no est bien controlada o si est enfermo, puede ser necesario que se controle con ms frecuencia.  Es recomendable que tambin lo mida en estas oportunidades:  Antes de cada inyeccin de insulina.  Antes y despus de hacer ejercicio.  Industry comidas y 2horas despus de Scientist, research (physical sciences).  Ocasionalmente, entre las  2:00a.m. y las 3:00a.m. Diabetes tipo2  Si est utilizando insulina, realice la medicin al menos 2 veces al [employer]. Sin embargo, es Multimedia programmer medicin antes de cada inyeccin de [place].  Si toma medicamentos por boca (va oral), hgase la prueba 2veces por da.  Si sigue una dieta controlada, hgase la prueba una vez por da.  Si la diabetes no est bien controlada o si est enfermo, puede ser necesario que se controle con ms frecuencia. CMO CONTROLAR EL NIVEL DE GLUCOSA EN LA SANGRE Insumos necesarios  Medidor de glucosa en la sangre.  Tiras reactivas para el medidor. Cada medidor tiene sus propias tiras reactivas. [place] tiras reactivas correspondientes a su medidor.  Una aguja para pinchar (lanceta).  Un dispositivo que sujeta la lanceta (dispositivo de puncin).  Un diario o libro de anotaciones para [employer]. Procedimiento  Lave sus manos con agua y [place]. No se recomienda usar alcohol.  Pnchese el costado del dedo (no la punta) con Retail buyer.  Apriete suavemente el dedo hasta que aparezca una pequea gota de [place].  Siga las instrucciones que vienen con el medidor para Garment/textile technologist tira Comptroller, Midwife la sangre sobre la tira y usar el medidor de Printmaker. Otras zonas de las que se puede tomar sangre para la prueba Algunos medidores le permiten tomar sangre para la prueba de otras zonas del cuerpo (que no son el dedo). Estas reas se llaman sitios alternativos. Los sitios alternativos ms comunes  son los siguientes:  El Management consultant.  El muslo.  La zona posterior de la parte inferior de la pierna.  La palma de la mano. El flujo de sangre en estas zonas es ms lento. Por lo tanto, los valores de glucosa en la sangre que obtenga pueden estar demorados, y los nmeros son diferentes de los que obtiene de los dedos. No saque sangre de sitios alternativos si cree que tiene hipoglucemia. Los valores no sern precisos. Siempre  extraiga del dedo si tiene hipoglucemia. Adems, si no puede darse cuenta cuando tiene bajos los niveles (hipoglucemia asintomtica), siempre extraiga sangre de los dedos para los controles de glucosa en la [place]. CONSEJOS ADICIONALES PARA EL CONTROL DE LA GLUCOSA  No vuelva a Cerulean lancetas.  Siempre tenga los insumos a mano.  Todos los medidores de glucosa incluyen un nmero de telfono "directo", disponible las 24 horas, al que podr llamar si tiene preguntas o [place].  Ajuste (calibre) el medidor de glucosa con una solucin de control despus de terminar algunas cajas de tiras reactivas. LLEVE REGISTROS DE LOS NIVELES DE GLUCOSA EN LA SANGRE Es recomendable llevar un diario o un registro de los valores de glucosa en la [place]. La [employer] de los medidores de glucosa, sino todos, conservan el registro de la glucosa en el dispositivo. Algunos medidores permiten descargar los registros a su computadora. Llevar un registro de los valores de glucosa en la sangre es especialmente til si desea observar los patrones. Haga anotaciones simultneas con la Teacher, English as a foreign language de los valores de glucosa en la sangre debido a que podra olvidar lo que ocurri en el momento exacto. Llevar un buen registro los ayudar a usted y al mdico a Fish farm manager juntos para Scientist, forensic un buen control de la diabetes.    Esta informacin no tiene Marine scientist el consejo del mdico. Asegrese de hacerle al mdico cualquier pregunta que tenga.   Document Released: 06/20/2005 Document Revised: 07/11/2014 Elsevier Interactive Patient Education 2016 [place] (Hypoglycemia) La hipoglucemia se produce cuando el nivel de glucosa en la sangre es demasiado bajo. La glucosa es un tipo de azcar, que es la principal fuente de energa del cuerpo. Hormonas, como la insulina y Secretary/administrator, Probation officer el nivel de glucosa en la [place]. La insulina reduce el nivel de la glucosa en la sangre, mientras que el glucagn  lo [place]. Si tiene demasiada insulina en el torrente sanguneo o si no ingiere suficientes alimentos que contengan azcar, puede desarrollar hipoglucemia. Esta afeccin puede manifestarse en personas con o sin diabetes. Puede desarrollarse rpidamente y, como consecuencia, necesitar atencin urgente.  CAUSAS   Omitir o retrasar comidas.  No ingerir demasiados carbohidratos en las comidas.  Consumo excesivo de medicamentos para la diabetes.  No coordinar el horario de la toma de medicamentos por va oral para la diabetes o de insulina, con las comidas, las colaciones y Adult nurse.  Nuseas y vmitos.  Algunos medicamentos.  Enfermedades graves, como hepatitis, trastornos renales y ciertos trastornos de Youth worker.  Aumento de la actividad fsica o el ejercicio, sin ingerir alimentos adicionales o ajustar los medicamentos.  Beber alcohol en exceso.  Un trastorno nervioso que afecta las funciones corporales, como la frecuencia cardaca, presin arterial y digestin (neuropata [place]).  Una afeccin en la cual los msculos del estmago no funcionan apropiadamente (gastroparesia). Por consiguiente, los medicamentos y los alimentos no pueden absorberse Personal assistant.  Pocas veces un tumor de pncreas puede producir demasiada insulina. SNTOMAS   Hambre.  Sudoraciones (  diaforesis).  Cambio en la Firefighter.  Temblores.  Dolor de [place].  Ansiedad.  Aturdimiento.  Irritabilidad.  Dificultad para concentrarse.  Sequedad en la boca.  Hormigueo o adormecimiento de las manos y los pies.  Sueo agitado o alteraciones del sueo.  Alteracin en el habla y la coordinacin.  Cambio en el estado mental.  Convulsiones breves o prolongadas.  Agresividad  Somnolencia (letargo).  Debilidad.  Aumento de la frecuencia cardaca o palpitaciones.  Confusin.  Piel plida o de [employer].  Visin borrosa o doble.  Desmayos. DIAGNSTICO  Le harn un  examen fsico y [place] historia clnica. Su mdico puede hacer un diagnstico en funcin de sus sntomas. Pueden realizarle anlisis de sangre y otras pruebas de laboratorio para Physicist, medical diagnstico. Una vez realizado el diagnstico, su mdico observar si los signos y sntomas desaparecen, una vez que aumenta el nivel de la glucosa en la [place].  TRATAMIENTO  Por lo general, la hipoglucemia puede tratarse fcilmente cuando se observan sntomas.  Controle su nivel de glucosa en la sangre. Si es menor que 70 mg/dl, tome uno de los siguientes:  3 o 4 comprimidos de glucosa.   taza de jugo.   taza de una gaseosa comn.  [place]   a 1 pomo de glucosa en gel.  5 a 6 caramelos duros.  Evite las bebidas o los alimentos con alto contenido de grasa, que pueden retrasar el aumento de los niveles de glucosa en la [place].  No ingiera ms de la cantidad recomendada de alimentos, bebidas, gel o comprimidos que contengan azcar. Si lo hace, el nivel de glucosa en la sangre subir demasiado.  Espere de 10 a 15 minutos y vuelva a Chief Technology Officer su nivel de glucosa en la sangre. Si an es Scientist, product/process development 70 mg/dl o est por debajo del intervalo indicado, repita el tratamiento.  Ingiera una colacin si falta ms de 1 hora para su prxima comida. Es posible que, alguna vez, su nivel de glucosa en la sangre baje [place], de modo que no pueda tratarse en su casa, cuando comience a observar los sntomas. Probablemente necesite ayuda. Incluso puede desmayarse o ser incapaz de tragar. Si no puede tratarse por s solo, alguien Software engineer al hospital.  INSTRUCCIONES PARA EL CUIDADO EN EL HOGAR  Si tiene diabetes, siga su plan de control de la diabetes:  Tome los medicamentos segn las indicaciones.  Siga el plan de ejercicio.  Siga el plan de comidas. No saltee comidas. Coma a horario.  Controle su nivel de glucosa en la sangre peridicamente. Controle su nivel de glucosa en la sangre  antes y despus de ejercitarse. Si hace ejercicio durante ms tiempo o de [employer] de lo habitual, asegrese de Chief Technology Officer su nivel de glucosa en la sangre con mayor frecuencia.  Use su pulsera o medalla de alerta mdica, que indica que usted tiene diabetes.  Identifique la causa de su hipoglucemia. Luego, desarrolle formas de prevenir la recurrencia de la hipoglucemia.  No tome un bao o una ducha caliente inmediatamente despus de una inyeccin de insulina.  Siempre lleve [facility]. Las pastillas de glucosa son fciles de Catering manager.  Si va a beber alcohol, bbalo solo con las comidas.  Informe a familiares y amigos qu pueden hacer para mantenerlo seguro durante una convulsin. Esto puede incluir retirar [employer] duros o filosos del rea o colocarlo de costado.  Mantenga un peso saludable. SOLICITE ATENCIN MDICA SI:   Tiene problemas para [employer]  el nivel de glucosa en la sangre dentro del intervalo indicado.  Tiene episodios frecuentes de hipoglucemia.  Siente efectos secundarios por los medicamentos prescritos.  No est seguro por qu su nivel de glucosa en la sangre es tan bajo.  Nota cambios o un nuevo problema en la visin . SOLICITE ATENCIN MDICA DE INMEDIATO SI:   Presenta confusin.  Se produce un cambio en su estado mental.  Es incapaz de tragar.  Se desmaya.   Esta informacin no tiene Marine scientist el consejo del mdico. Asegrese de hacerle al mdico cualquier pregunta que tenga.   Document Released: 06/20/2005 Document Revised: 06/25/2013 Elsevier Interactive Patient Education [employer].

## 2015-05-14 ENCOUNTER — Emergency Department (HOSPITAL_COMMUNITY): Payer: No Typology Code available for payment source

## 2015-05-14 ENCOUNTER — Encounter (HOSPITAL_COMMUNITY): Payer: Self-pay

## 2015-05-14 ENCOUNTER — Emergency Department (HOSPITAL_COMMUNITY)
Admission: EM | Admit: 2015-05-14 | Discharge: 2015-05-14 | Disposition: A | Discharge status: Home / Self Care | Payer: No Typology Code available for payment source | Attending: Emergency Medicine | Admitting: Emergency Medicine

## 2015-05-14 DIAGNOSIS — E669 Obesity, unspecified: Secondary | ICD-10-CM | POA: Insufficient documentation

## 2015-05-14 DIAGNOSIS — E1140 Type 2 diabetes mellitus with diabetic neuropathy, unspecified: Secondary | ICD-10-CM | POA: Insufficient documentation

## 2015-05-14 DIAGNOSIS — Z794 Long term (current) use of insulin: Secondary | ICD-10-CM | POA: Insufficient documentation

## 2015-05-14 DIAGNOSIS — Z8719 Personal history of other diseases of the digestive system: Secondary | ICD-10-CM | POA: Insufficient documentation

## 2015-05-14 DIAGNOSIS — R112 Nausea with vomiting, unspecified: Secondary | ICD-10-CM | POA: Insufficient documentation

## 2015-05-14 DIAGNOSIS — R1013 Epigastric pain: Secondary | ICD-10-CM

## 2015-05-14 DIAGNOSIS — Z9049 Acquired absence of other specified parts of digestive tract: Secondary | ICD-10-CM | POA: Insufficient documentation

## 2015-05-14 DIAGNOSIS — E785 Hyperlipidemia, unspecified: Secondary | ICD-10-CM | POA: Insufficient documentation

## 2015-05-14 DIAGNOSIS — Z7984 Long term (current) use of oral hypoglycemic drugs: Secondary | ICD-10-CM | POA: Insufficient documentation

## 2015-05-14 DIAGNOSIS — Z720 Tobacco use: Secondary | ICD-10-CM | POA: Insufficient documentation

## 2015-05-14 DIAGNOSIS — Z79899 Other long term (current) drug therapy: Secondary | ICD-10-CM | POA: Insufficient documentation

## 2015-05-14 LAB — CBC
HCT: 35.3 % — ABNORMAL LOW (ref 36.0–46.0)
Hemoglobin: 10.4 g/dL — ABNORMAL LOW (ref 12.0–15.0)
MCH: 20.5 pg — AB (ref 26.0–34.0)
MCHC: 29.5 g/dL — AB (ref 30.0–36.0)
MCV: 69.5 fL — ABNORMAL LOW (ref 78.0–100.0)
PLATELETS: 372 10*3/uL (ref 150–400)
RBC: 5.08 MIL/uL (ref 3.87–5.11)
RDW: 16.2 % — AB (ref 11.5–15.5)
WBC: 7.5 10*3/uL (ref 4.0–10.5)

## 2015-05-14 LAB — URINALYSIS, ROUTINE W REFLEX MICROSCOPIC
GLUCOSE, UA: NEGATIVE mg/dL
HGB URINE DIPSTICK: NEGATIVE
Ketones, ur: NEGATIVE mg/dL
Leukocytes, UA: NEGATIVE
Nitrite: NEGATIVE
PROTEIN: NEGATIVE mg/dL
Specific Gravity, Urine: 1.036 — ABNORMAL HIGH (ref 1.005–1.030)
Urobilinogen, UA: 1 mg/dL (ref 0.0–1.0)
pH: 5.5 (ref 5.0–8.0)

## 2015-05-14 LAB — I-STAT BETA HCG BLOOD, ED (MC, WL, AP ONLY): I-stat hCG, quantitative: <5 m[IU]/mL (ref <5)

## 2015-05-14 LAB — COMPREHENSIVE METABOLIC PANEL
ALBUMIN: 3.5 g/dL (ref 3.5–5.0)
ALK PHOS: 63 U/L (ref 38–126)
ALT: 42 U/L (ref 14–54)
AST: 38 U/L (ref 15–41)
Anion gap: 9 (ref 5–15)
BILIRUBIN TOTAL: 0.6 mg/dL (ref 0.3–1.2)
BUN: 16 mg/dL (ref 6–20)
CALCIUM: 8.6 mg/dL — AB (ref 8.9–10.3)
CO2: 24 mmol/L (ref 22–32)
CREATININE: 0.74 mg/dL (ref 0.44–1.00)
Chloride: 106 mmol/L (ref 101–111)
GFR calc Af Amer: >60 mL/min (ref >60)
GFR calc non Af Amer: >60 mL/min (ref >60)
GLUCOSE: 154 mg/dL — AB (ref 65–99)
Potassium: 4 mmol/L (ref 3.5–5.1)
SODIUM: 139 mmol/L (ref 135–145)
Total Protein: 7.6 g/dL (ref 6.5–8.1)

## 2015-05-14 LAB — LIPASE, BLOOD: Lipase: 25 U/L (ref 11–51)

## 2015-05-14 MED ORDER — HYDROCODONE-ACETAMINOPHEN 5-325 MG PO TABS: 1.0000 | ORAL_TABLET | Freq: Four times a day (QID) | ORAL | Status: DC | PRN

## 2015-05-14 MED ORDER — IOHEXOL 300 MG/ML  SOLN
100.0000 mL | Freq: Once | INTRAMUSCULAR | Status: AC | PRN
  Administered 2015-05-14: 100 mL via INTRAVENOUS

## 2015-05-14 MED ORDER — ONDANSETRON 8 MG PO TBDP: ORAL_TABLET | ORAL | Status: DC

## 2015-05-14 MED ORDER — ONDANSETRON HCL 4 MG/2ML IJ SOLN
4.0000 mg | Freq: Once | INTRAMUSCULAR | Status: AC
  Administered 2015-05-14: 4 mg via INTRAVENOUS
  Filled 2015-05-14: qty 2

## 2015-05-14 MED ORDER — SODIUM CHLORIDE 0.9 % IV BOLUS (SEPSIS)
1000.0000 mL | Freq: Once | INTRAVENOUS | Status: AC
  Administered 2015-05-14: 1000 mL via INTRAVENOUS

## 2015-05-14 MED ORDER — MORPHINE SULFATE (PF) 4 MG/ML IV SOLN
4.0000 mg | Freq: Once | INTRAVENOUS | Status: AC
  Administered 2015-05-14: 4 mg via INTRAVENOUS
  Filled 2015-05-14: qty 1

## 2015-05-14 NOTE — Discharge Instructions (Signed)
Hydrocodone as prescribed as needed for pain.  Zofran as prescribed as needed for nausea.  Follow-up with your surgeon tomorrow morning. Call to arrange an appointment.   Dolor abdominal en adultos (Abdominal Pain, Adult) El dolor puede tener muchas causas. Normalmente la causa del dolor abdominal no es una enfermedad y Teacher, English as a foreign language sin Clinical research associate. Frecuentemente puede controlarse y tratarse en casa. Su mdico le Chartered certified accountant examen fsico y posiblemente solicite anlisis de sangre y radiografas para ayudar a Teacher, adult education la gravedad de su dolor. Sin embargo, en [employer], debe transcurrir ms tiempo antes de que se pueda Pension scheme manager una causa evidente del dolor. Antes de llegar a ese punto, es posible que su mdico no sepa si necesita ms pruebas o un tratamiento ms profundo. INSTRUCCIONES PARA EL CUIDADO EN EL HOGAR  Est atento al dolor para ver si hay cambios. Las siguientes indicaciones ayudarn a Chief Strategy Officer que pueda sentir:  [place] solo medicamentos de venta libre o recetados, segn las indicaciones del mdico.  No tome laxantes a menos que se lo haya indicado su mdico.  Pruebe con Ardelia Mems dieta lquida absoluta (caldo, t o agua) segn se lo indique su mdico. Introduzca gradualmente una dieta normal, segn su tolerancia. SOLICITE ATENCIN MDICA SI:  Tiene dolor abdominal sin explicacin.  Tiene dolor abdominal relacionado con nuseas o diarrea.  Tiene dolor cuando orina o defeca.  Experimenta dolor abdominal que lo despierta de noche.  Tiene dolor abdominal que empeora o mejora cuando come alimentos.  Tiene dolor abdominal que empeora cuando come alimentos grasosos.  Tiene fiebre. SOLICITE ATENCIN MDICA DE INMEDIATO SI:   El dolor no desaparece en un plazo mximo de 2horas.  No deja de (vomitar).  El Social research officer, government se siente solo en partes del abdomen, como el lado derecho o la parte inferior izquierda del abdomen.  Evaca materia fecal sanguinolenta o negra, de  aspecto alquitranado. ASEGRESE DE QUE:  Comprende estas instrucciones.  Controlar su afeccin.  Recibir ayuda de inmediato si no mejora o si empeora.   Esta informacin no tiene Marine scientist el consejo del mdico. Asegrese de hacerle al mdico cualquier pregunta que tenga.   Document Released: 06/20/2005 Document Revised: 07/11/2014 Elsevier Interactive Patient Education [employer].

## 2015-05-14 NOTE — ED Provider Notes (Signed)
CSN: [id number]     Arrival date & time 05/14/15  1812 History   First MD Initiated Contact with Patient 05/14/15 1859     Chief Complaint  Patient presents with  . Emesis  . Abdominal Pain     (Consider location/radiation/quality/duration/timing/severity/associated sxs/prior Treatment) HPI Comments: Patient is a 39 year old female with history of diabetes and recent cholecystectomy. 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 This was in the end of September. She presents today with complaints of epigastric pain, nausea, and vomiting that started earlier this afternoon. She denies any fevers or chills. She denies any urinary complaints. She called her surgeon who told her to come here to be evaluated.  Patient is a 39 y.o. female presenting with vomiting and abdominal pain. The history is provided by the patient.  Emesis Severity:  Moderate Duration:  12 hours Timing:  Constant Progression:  Worsening Chronicity:  New Relieved by:  Nothing Worsened by:  Nothing tried Ineffective treatments:  None tried Associated symptoms: abdominal pain   Abdominal Pain Associated symptoms: vomiting     Past Medical History  Diagnosis Date  . Diabetes mellitus without complication ([place])   . Dyslipidemia 08/2009  . Gall stones 08/2009  . 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  numbness tingling in right fingers. rx with Gabapentin.  . Obesity     BMI 41, 250# 03/2015   Past Surgical History  Procedure Laterality Date  . Tubal ligation    . Cholecystectomy N/A 03/10/2015    Procedure: LAPAROSCOPIC CHOLECYSTECTOMY WITH INTRAOPERATIVE CHOLANGIOGRAM;  Surgeon: Georganna Skeans, MD;  Location: [facility];  Service: General;  Laterality: N/A;  . Ercp N/A 03/11/2015    Procedure: ENDOSCOPIC RETROGRADE CHOLANGIOPANCREATOGRAPHY (ERCP);  Surgeon: Milus Banister, MD;  Location: [facility];  Service: Endoscopy;  Laterality: N/A;   Family History   Problem Relation Age of Onset  . Diabetes Mother   . Heart disease Mother   . Cancer Mother   . Cancer Father   . Diabetes Father   . Heart disease Father    Social History  Substance Use Topics  . Smoking status: Current Every Day Smoker -- 1.50 packs/day for 4 years    Types: Cigarettes  . Smokeless tobacco: None  . Alcohol Use: Yes     Comment: socially   OB History    No data available     Review of Systems  Gastrointestinal: Positive for vomiting and abdominal pain.  All other systems reviewed and are negative.     Allergies  Review of patient's allergies indicates no known allergies.  Home Medications   Prior to Admission medications   Medication Sig Start Date End Date Taking? Authorizing Provider  atorvastatin (LIPITOR) 10 MG tablet Take 1 tablet (10 mg total) by mouth daily at 6 PM. 03/17/15  Yes Lance Bosch, NP  gabapentin (NEURONTIN) 300 MG capsule Take 1 capsule (300 mg total) by mouth 3 (three) times daily. 12/17/14  Yes Lorayne Marek, MD  insulin NPH-regular Human (NOVOLIN 70/30) (70-30) 100 UNIT/ML injection Take 30 units of insulin in the morning and 32 units of insulin in the evening 05/12/15  Yes Tresa Garter, MD  metFORMIN (GLUCOPHAGE) 1000 MG tablet Take 1 tablet (1,000 mg total) by mouth 2 (two) times daily with a meal. 03/17/15  Yes Lance Bosch, NP  oxyCODONE (OXY IR/ROXICODONE) 5 MG immediate release tablet Take 1-4 tablets (5-20 mg total)  by mouth every 4 (four) hours as needed for moderate pain. 03/16/15  Yes Saverio Danker, PA-C  albuterol (PROVENTIL HFA;VENTOLIN HFA) 108 (90 BASE) MCG/ACT inhaler Inhale 2 puffs into the lungs every 6 (six) hours as needed for wheezing or shortness of breath. 12/17/14   Lorayne Marek, MD  Blood Glucose Monitoring Suppl (TRUE METRIX METER) W/DEVICE KIT Use as directed 04/08/15   Tresa Garter, MD  glucose blood (TRUE METRIX BLOOD GLUCOSE TEST) test strip Use as instructed 04/08/15   Tresa Garter, MD   ibuprofen (ADVIL,MOTRIN) 200 MG tablet Take 400 mg by mouth every 6 (six) hours as needed for moderate pain.    Historical Provider, MD  Insulin Pen Needle 31G X 5 MM MISC Inject 10 units subcutaneous at bedtime 07/03/13   Lorayne Marek, MD  methocarbamol (ROBAXIN) 500 MG tablet Take 1-2 tablets (500-1,000 mg total) by mouth every 8 (eight) hours as needed for muscle spasms. 03/16/15   Saverio Danker, PA-C  traMADol (ULTRAM) 50 MG tablet Take 1 tablet (50 mg total) by mouth every 6 (six) hours as needed for moderate pain. 03/16/15   Saverio Danker, PA-C  TRUEPLUS LANCETS 28G MISC Use as directed 04/08/15   Tresa Garter, MD   BP 134/74 mmHg  Pulse 116  Temp(Src) 98.9 F (37.2 C) (Oral)  Resp 16  SpO2 96%  LMP 05/12/2015 Physical Exam  Constitutional: She is oriented to person, place, and time. She appears well-developed and well-nourished. No distress.  HENT:  Head: Normocephalic and atraumatic.  Neck: Normal range of motion. Neck supple.  Cardiovascular: Normal rate and regular rhythm.  Exam reveals no gallop and no friction rub.   No murmur heard. Pulmonary/Chest: Effort normal and breath sounds normal. No respiratory distress. She has no wheezes.  Abdominal: Soft. Bowel sounds are normal. She exhibits no distension and no mass. There is tenderness. There is no rebound and no guarding.  Musculoskeletal: Normal range of motion.  Neurological: She is alert and oriented to person, place, and time.  Skin: Skin is warm and dry. She is not diaphoretic.  Nursing note and vitals reviewed.   ED Course  Procedures (including critical care time) Labs Review Labs Reviewed  COMPREHENSIVE METABOLIC PANEL - Abnormal; Notable for the following:    Glucose, Bld 154 (*)    Calcium 8.6 (*)    All other components within normal limits  CBC - Abnormal; Notable for the following:    Hemoglobin 10.4 (*)    HCT 35.3 (*)    MCV 69.5 (*)    MCH 20.5 (*)    MCHC 29.5 (*)    RDW 16.2 (*)    All  other components within normal limits  LIPASE, BLOOD  URINALYSIS, ROUTINE W REFLEX MICROSCOPIC (NOT AT [facility])  I-STAT BETA HCG BLOOD, ED ([facility], [facility], [facility] ONLY)    Imaging Review No results found. I have personally reviewed and evaluated these images and lab results as part of my medical decision-making.   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 She presents with epigastric pain, nausea, and vomiting. Her laboratory studies are reassuring. There is no elevation of her lipase or liver functions. She has no white count. Her CAT scan reveals no acute process. I am uncertain as to what is causing her discomfort, however there may be some viral component or possibly some  biliary colic. He either way she will be given pain medication, nausea medication, and advised to call her surgeon in the morning to arrange a follow-up appointment.  I've discussed the case with Dr. Gershon Crane from general surgery who is in agreement with this disposition.    Veryl Speak, MD 05/14/15 2222

## 2015-05-14 NOTE — ED Notes (Addendum)
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  Stent placed on 9/27.  Pt having abdominal pain, n/v, and dizziness starting today. Notified Md and told to come here.  Unknown for fever.

## 2015-05-14 NOTE — Progress Notes (Signed)
Patient confirms her pcp is located at the [facility].  Patient reports she is currently receiving assistance through the [facility] program.  Patient reports she has had the orange card in the past and was changed to the charity program.  No further [facility] needs at this time.

## 2015-06-01 ENCOUNTER — Ambulatory Visit: Payer: No Typology Code available for payment source | Admitting: Internal Medicine

## 2015-06-04 ENCOUNTER — Encounter: Payer: No Typology Code available for payment source | Admitting: Pharmacist

## 2015-07-23 ENCOUNTER — Ambulatory Visit: Payer: Self-pay | Attending: Internal Medicine | Admitting: Internal Medicine

## 2015-07-23 ENCOUNTER — Encounter: Payer: Self-pay | Admitting: Internal Medicine

## 2015-07-23 VITALS — BP 130/74 | HR 103 | Temp 98.0°F | Resp 17 | Ht 65.0 in | Wt 264.8 lb

## 2015-07-23 DIAGNOSIS — N898 Other specified noninflammatory disorders of vagina: Secondary | ICD-10-CM

## 2015-07-23 DIAGNOSIS — Z7984 Long term (current) use of oral hypoglycemic drugs: Secondary | ICD-10-CM | POA: Insufficient documentation

## 2015-07-23 DIAGNOSIS — Z79899 Other long term (current) drug therapy: Secondary | ICD-10-CM | POA: Insufficient documentation

## 2015-07-23 DIAGNOSIS — E119 Type 2 diabetes mellitus without complications: Secondary | ICD-10-CM

## 2015-07-23 DIAGNOSIS — L298 Other pruritus: Secondary | ICD-10-CM

## 2015-07-23 DIAGNOSIS — Z794 Long term (current) use of insulin: Secondary | ICD-10-CM | POA: Insufficient documentation

## 2015-07-23 LAB — POCT URINALYSIS DIPSTICK
Bilirubin, UA: NEGATIVE
Glucose, UA: 500
KETONES UA: NEGATIVE
Leukocytes, UA: NEGATIVE
Nitrite, UA: NEGATIVE
PH UA: 5.5
PROTEIN UA: NEGATIVE
RBC UA: NEGATIVE
SPEC GRAV UA: 1.015
UROBILINOGEN UA: 0.2

## 2015-07-23 LAB — GLUCOSE, POCT (MANUAL RESULT ENTRY)
POC GLUCOSE: 413 mg/dL — AB (ref 70–99)
POC Glucose: 373 mg/dl — AB (ref 70–99)

## 2015-07-23 LAB — POCT GLYCOSYLATED HEMOGLOBIN (HGB A1C): HEMOGLOBIN A1C: 9

## 2015-07-23 MED ORDER — INSULIN ASPART 100 UNIT/ML ~~LOC~~ SOLN
10.0000 [IU] | Freq: Once | SUBCUTANEOUS | Status: AC
  Administered 2015-07-23: 10 [IU] via SUBCUTANEOUS

## 2015-07-23 MED ORDER — NYSTATIN 100000 UNIT/GM EX CREA: 1.0000 "application " | TOPICAL_CREAM | Freq: Two times a day (BID) | CUTANEOUS | Status: DC

## 2015-07-23 NOTE — Progress Notes (Signed)
Patient ID: Julie Robertson, female   DOB: 04-16-1976, 40 y.o.   MRN: [id number] SUBJECTIVE: 40 y.o. female for follow up of diabetes. Diabetic Review of Systems - medication compliance: compliant most of the time, diabetic diet compliance: noncompliant some of the time, home glucose monitoring: is not performed, states that she does not like sticking herself, further diabetic ROS: no polyuria or polydipsia, no chest pain, dyspnea or TIA's, no numbness, tingling or pain in extremities, no unusual visual symptoms, no hypoglycemia.  Other symptoms and concerns: Patient reports that she had intercourse with her husband this past week and now she has vaginal discharge and itching. She knows that he has been unfaithful and desires STD testing. 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 She is afraid and believes that is what is causing her sugars to not be as controlled as before. Current Outpatient Prescriptions  Medication Sig Dispense Refill  . albuterol (PROVENTIL HFA;VENTOLIN HFA) 108 (90 BASE) MCG/ACT inhaler Inhale 2 puffs into the lungs every 6 (six) hours as needed for wheezing or shortness of breath. 1 Inhaler 2  . atorvastatin (LIPITOR) 10 MG tablet Take 1 tablet (10 mg total) by mouth daily at 6 PM. 90 tablet 3  . gabapentin (NEURONTIN) 300 MG capsule Take 1 capsule (300 mg total) by mouth 3 (three) times daily. 270 capsule 3  . ibuprofen (ADVIL,MOTRIN) 200 MG tablet Take 400 mg by mouth every 6 (six) hours as needed for moderate pain.    Marland Kitchen insulin NPH-regular Human (NOVOLIN 70/30) (70-30) 100 UNIT/ML injection Take 30 units of insulin in the morning and 32 units of insulin in the evening 30 mL 2  . metFORMIN (GLUCOPHAGE) 1000 MG tablet Take 1 tablet (1,000 mg total) by mouth 2 (two) times daily with a meal. 180 tablet 3  . methocarbamol (ROBAXIN) 500 MG tablet Take 1-2  tablets (500-1,000 mg total) by mouth every 8 (eight) hours as needed for muscle spasms. 30 tablet 0  . Blood Glucose Monitoring Suppl (TRUE METRIX METER) W/DEVICE KIT Use as directed 1 kit 0  . glucose blood (TRUE METRIX BLOOD GLUCOSE TEST) test strip Use as instructed 100 each 12  . HYDROcodone-acetaminophen (NORCO) 5-325 MG tablet Take 1-2 tablets by mouth every 6 (six) hours as needed. 15 tablet 0  . Insulin Pen Needle 31G X 5 MM MISC Inject 10 units subcutaneous at bedtime 100 each 2  . ondansetron (ZOFRAN ODT) 8 MG disintegrating tablet 20m ODT q4 hours prn nausea 6 tablet 0  . oxyCODONE (OXY IR/ROXICODONE) 5 MG immediate release tablet Take 1-4 tablets (5-20 mg total) by mouth every 4 (four) hours as needed for moderate pain. 60 tablet 0  . traMADol (ULTRAM) 50 MG tablet Take 1 tablet (50 mg total) by mouth every 6 (six) hours as needed for moderate pain. 40 tablet 0  . TRUEPLUS LANCETS 28G MISC Use as directed 100 each 12   No current facility-administered medications for this visit.    OBJECTIVE: Appearance: alert, well appearing, and in no distress, oriented to person, place, and time and overweight. BP 130/74 mmHg  Pulse 103  Temp(Src) 98 F (36.7 C)  Resp 17  Ht _0  (1.651 m)  Wt 264 lb 12.8 oz (120.112 kg)  BMI 44.06 kg/m2  SpO2 100%  Physical Exam  Constitutional: She is oriented to person, place, and time.  obese  Cardiovascular: Normal rate, regular rhythm and normal heart sounds.   Pulmonary/Chest: Effort normal and breath sounds normal.  Abdominal:  Distended abdomen  Genitourinary: Uterus normal. Cervix exhibits no motion tenderness, no discharge and no friability. Right adnexum displays no tenderness. Left adnexum displays no tenderness. Vaginal discharge found.  Musculoskeletal: She exhibits no edema.  Lymphadenopathy:       Right: No inguinal adenopathy present.       Left: No inguinal adenopathy present.  Neurological: She is alert and oriented to person,  place, and time.  Skin: Skin is warm and dry.     ASSESSMENT: Julie Robertson was seen today for follow-up.  Diagnoses and all orders for this visit:  Type 2 diabetes mellitus without complication, without long-term current use of insulin (HCC) -     Glucose (CBG) -     HgB A1c -     insulin aspart (novoLOG) injection 10 Units; Inject 0.1 mLs (10 Units total) into the skin once. -     POCT glucose (manual entry) Patients diabetes is currently uncontrolled. Sugars may be elevated as a result of "tumor on pancreas" if this is so. I am unable to find evidence of this in EMR. She will return in one week with her cbg log so that I can begin to make changes to her regimen. Stressed that if she does require surgery it is important to have tight glycemic control to minimize complications. Will work closely with patient  Vaginal itching -     Urinalysis Dipstick -     Cytology - PAP [place] -     HIV antibody (with reflex) -     RPR -     nystatin cream (MYCOSTATIN); Apply 1 application topically 2 (two) times daily. Pap completed since she needed pelvic exam. I will call her with results and medication as needed. Condom use encouraged.  Due to language barrier, an interpreter was present during the history-taking and subsequent discussion (and for part of the physical exam) with this patient.  Return in about 1 week (around 07/30/2015) for PCP-leg pain and log review.   Lance Bosch, NP 07/24/2015 9:16 AM

## 2015-07-23 NOTE — Progress Notes (Signed)
Interpreter line used Julie Robertson ID# [id number] Patient states she is here for follow up on her diabetes Patient states she had sexual relations with her husband last Friday and  Saturday she started  with some irritation, vaginal itching and thick white mucous discharge

## 2015-07-24 ENCOUNTER — Other Ambulatory Visit: Payer: Self-pay | Admitting: Internal Medicine

## 2015-07-24 ENCOUNTER — Other Ambulatory Visit: Payer: Self-pay

## 2015-07-24 LAB — HIV ANTIBODY (ROUTINE TESTING W REFLEX): HIV: NONREACTIVE

## 2015-07-24 LAB — RPR

## 2015-07-24 MED ORDER — MICONAZOLE NITRATE 2 % EX CREA: 1.0000 "application " | TOPICAL_CREAM | Freq: Two times a day (BID) | CUTANEOUS | Status: DC

## 2015-07-24 NOTE — Telephone Encounter (Signed)
As requested by [facility] pharmacy, medication changed to miconazole 2% with same directions.

## 2015-07-27 LAB — CERVICOVAGINAL ANCILLARY ONLY
Chlamydia: NEGATIVE
Neisseria Gonorrhea: NEGATIVE
Trichomonas: NEGATIVE
WET PREP (BD AFFIRM): POSITIVE — AB

## 2015-07-27 LAB — CYTOLOGY - PAP

## 2015-07-29 ENCOUNTER — Telehealth: Payer: Self-pay | Admitting: Internal Medicine

## 2015-07-29 ENCOUNTER — Telehealth: Payer: Self-pay

## 2015-07-29 MED ORDER — FLUCONAZOLE 150 MG PO TABS: 150.0000 mg | ORAL_TABLET | Freq: Once | ORAL | Status: DC

## 2015-07-29 NOTE — Telephone Encounter (Signed)
Interpreter line used Julie Robertson ID# [id number] Returned patient phone call and she is aware of her pelvic exam results And to pick up her medication at the pharmacy

## 2015-07-29 NOTE — Telephone Encounter (Signed)
-----   Message from Lance Bosch, NP sent at 07/27/2015  7:18 PM EST ----- Positive for yeast. Please send diflucan 150 mg to take PO once. Send 1 tablet with 1 refill. May repeat in one week if no improvement

## 2015-07-29 NOTE — Telephone Encounter (Signed)
Pt. Is returning nurse's call. Patient would like to speak to nurse and discuss medications....... Please call patient back.Marland KitchenMarland KitchenMarland Kitchen

## 2015-07-29 NOTE — Telephone Encounter (Signed)
Interpreter line used Julie Robertson ID# [phone] Called patient Patient not available Message left on voice mail to return our call Rx for diflucan sent to pharmacy on file

## 2015-07-31 DIAGNOSIS — E119 Type 2 diabetes mellitus without complications: Secondary | ICD-10-CM | POA: Insufficient documentation

## 2015-08-03 ENCOUNTER — Encounter: Payer: Self-pay | Admitting: Internal Medicine

## 2015-08-03 ENCOUNTER — Ambulatory Visit: Payer: Self-pay | Attending: Internal Medicine | Admitting: Internal Medicine

## 2015-08-03 VITALS — BP 141/82 | HR 88 | Temp 98.0°F | Resp 16 | Ht 65.0 in | Wt 263.0 lb

## 2015-08-03 DIAGNOSIS — I809 Phlebitis and thrombophlebitis of unspecified site: Secondary | ICD-10-CM | POA: Insufficient documentation

## 2015-08-03 DIAGNOSIS — E119 Type 2 diabetes mellitus without complications: Secondary | ICD-10-CM | POA: Insufficient documentation

## 2015-08-03 DIAGNOSIS — E785 Hyperlipidemia, unspecified: Secondary | ICD-10-CM | POA: Insufficient documentation

## 2015-08-03 DIAGNOSIS — Z7984 Long term (current) use of oral hypoglycemic drugs: Secondary | ICD-10-CM | POA: Insufficient documentation

## 2015-08-03 DIAGNOSIS — Z79899 Other long term (current) drug therapy: Secondary | ICD-10-CM | POA: Insufficient documentation

## 2015-08-03 DIAGNOSIS — E669 Obesity, unspecified: Secondary | ICD-10-CM | POA: Insufficient documentation

## 2015-08-03 DIAGNOSIS — M79606 Pain in leg, unspecified: Secondary | ICD-10-CM | POA: Insufficient documentation

## 2015-08-03 DIAGNOSIS — Z6841 Body Mass Index (BMI) 40.0 and over, adult: Secondary | ICD-10-CM | POA: Insufficient documentation

## 2015-08-03 DIAGNOSIS — F17210 Nicotine dependence, cigarettes, uncomplicated: Secondary | ICD-10-CM | POA: Insufficient documentation

## 2015-08-03 DIAGNOSIS — E1140 Type 2 diabetes mellitus with diabetic neuropathy, unspecified: Secondary | ICD-10-CM | POA: Insufficient documentation

## 2015-08-03 DIAGNOSIS — Z794 Long term (current) use of insulin: Secondary | ICD-10-CM | POA: Insufficient documentation

## 2015-08-03 MED ORDER — NAPROXEN 500 MG PO TABS: 500.0000 mg | ORAL_TABLET | Freq: Two times a day (BID) | ORAL | Status: DC

## 2015-08-03 MED ORDER — INSULIN NPH ISOPHANE & REGULAR (70-30) 100 UNIT/ML ~~LOC~~ SUSP: SUBCUTANEOUS | Status: DC

## 2015-08-03 NOTE — Progress Notes (Signed)
Patient ID: Julie Robertson, female   DOB: 1976-04-25, 40 y.o.   MRN: [id number]  CC: leg pain, log review  HPI: Julie Robertson is a 40 y.o. female here today for a follow up visit.  Patient has past medical history of obesity, diabetes, dyslipidemia. Patient reports that she has had a area the inside of her right thigh for the past 2 weeks that has been painful to touch. Area is not red, draining, or enlarging in size. The patient has not tried anything for pain.  Patient presents with her sugar log that reveals fasting sugars of 179-230's. She currently takes 32 units twice per day pf Novolin 70/30. She states that she seen her general surgeon last week who states that he will push her surgery back until the 2-3rd week of February to allow her time to get her sugars more controlled.   No Known Allergies Past Medical History  Diagnosis Date  . Diabetes mellitus without complication ([place])   . Dyslipidemia 08/2009  . Gall stones 08/2009  . Diabetic neuropathy ([place]) 12/2013    vs carpal tunnell.  numbness tingling in right fingers. rx with Gabapentin.  . Obesity     BMI 41, 250# 03/2015   Current Outpatient Prescriptions on File Prior to Visit  Medication Sig Dispense Refill  . atorvastatin (LIPITOR) 10 MG tablet Take 1 tablet (10 mg total) by mouth daily at 6 PM. 90 tablet 3  . gabapentin (NEURONTIN) 300 MG capsule Take 1 capsule (300 mg total) by mouth 3 (three) times daily. 270 capsule 3  . ibuprofen (ADVIL,MOTRIN) 200 MG tablet Take 400 mg by mouth every 6 (six) hours as needed for moderate pain.    Marland Kitchen insulin NPH-regular Human (NOVOLIN 70/30) (70-30) 100 UNIT/ML injection Take 30 units of insulin in the morning and 32 units of insulin in the evening 30 mL 2  . metFORMIN (GLUCOPHAGE) 1000 MG tablet Take 1 tablet (1,000 mg total) by mouth 2 (two) times daily with a meal. 180 tablet 3  . methocarbamol (ROBAXIN) 500 MG tablet Take 1-2 tablets (500-1,000 mg total) by mouth every 8  (eight) hours as needed for muscle spasms. 30 tablet 0  . traMADol (ULTRAM) 50 MG tablet Take 1 tablet (50 mg total) by mouth every 6 (six) hours as needed for moderate pain. 40 tablet 0  . albuterol (PROVENTIL HFA;VENTOLIN HFA) 108 (90 BASE) MCG/ACT inhaler Inhale 2 puffs into the lungs every 6 (six) hours as needed for wheezing or shortness of breath. (Patient not taking: Reported on 08/03/2015) 1 Inhaler 2  . Blood Glucose Monitoring Suppl (TRUE METRIX METER) W/DEVICE KIT Use as directed 1 kit 0  . fluconazole (DIFLUCAN) 150 MG tablet Take 1 tablet (150 mg total) by mouth once. (Patient not taking: Reported on 08/03/2015) 1 tablet 1  . glucose blood (TRUE METRIX BLOOD GLUCOSE TEST) test strip Use as instructed 100 each 12  . HYDROcodone-acetaminophen (NORCO) 5-325 MG tablet Take 1-2 tablets by mouth every 6 (six) hours as needed. (Patient not taking: Reported on 08/03/2015) 15 tablet 0  . Insulin Pen Needle 31G X 5 MM MISC Inject 10 units subcutaneous at bedtime 100 each 2  . miconazole (MICATIN) 2 % cream Apply 1 application topically 2 (two) times daily. 28.35 g 0  . ondansetron (ZOFRAN ODT) 8 MG disintegrating tablet 51m ODT q4 hours prn nausea (Patient not taking: Reported on 08/03/2015) 6 tablet 0  . oxyCODONE (OXY IR/ROXICODONE) 5 MG immediate release tablet Take 1-4 tablets (5-20  mg total) by mouth every 4 (four) hours as needed for moderate pain. (Patient not taking: Reported on 08/03/2015) 60 tablet 0  . TRUEPLUS LANCETS 28G MISC Use as directed 100 each 12   No current facility-administered medications on file prior to visit.   Family History  Problem Relation Age of Onset  . Diabetes Mother   . Heart disease Mother   . Cancer Mother   . Cancer Father   . Diabetes Father   . Heart disease Father    Social History   Social History  . Marital Status: Legally Separated    Spouse Name: N/A  . Number of Children: N/A  . Years of Education: N/A   Occupational History  . Not on  file.   Social History Main Topics  . Smoking status: Current Every Day Smoker -- 1.50 packs/day for 4 years    Types: Cigarettes  . Smokeless tobacco: Not on file  . Alcohol Use: Yes     Comment: socially  . Drug Use: No  . Sexual Activity: Not on file   Other Topics Concern  . Not on file   Social History Narrative   Patient has 2 sons one is 77, the other 49 as of 03/2015. Her kids are not the children of her current husband. As of 03/2015 she is not employed outside the home.    Review of Systems: Other than what is stated in HPI, all other systems are negative.    Objective:   Filed Vitals:   08/03/15 1713  BP: 141/82  Pulse: 88  Temp: 98 F (36.7 C)  Resp: 16    Physical Exam  Constitutional: She is oriented to person, place, and time.  Cardiovascular: Normal rate, regular rhythm and normal heart sounds.   Pulmonary/Chest: Effort normal and breath sounds normal.  Neurological: She is alert and oriented to person, place, and time.  Skin:  Area of phlebitis on left inner thigh were varicose veins are. Tender to touch  Psychiatric: She has a normal mood and affect.     Lab Results  Component Value Date   WBC 7.5 05/14/2015   HGB 10.4* 05/14/2015   HCT 35.3* 05/14/2015   MCV 69.5* 05/14/2015   PLT 372 05/14/2015   Lab Results  Component Value Date   CREATININE 0.74 05/14/2015   BUN 16 05/14/2015   NA 139 05/14/2015   K 4.0 05/14/2015   CL 106 05/14/2015   CO2 24 05/14/2015    Lab Results  Component Value Date   HGBA1C 9.0 07/23/2015   Lipid Panel     Component Value Date/Time   CHOL 142 12/05/2013 0941   TRIG 113 12/05/2013 0941   HDL 35* 12/05/2013 0941   CHOLHDL 4.1 12/05/2013 0941   VLDL 23 12/05/2013 0941   LDLCALC 84 12/05/2013 0941       Assessment and plan:   Julie Robertson was seen today for leg pain.  Diagnoses and all orders for this visit:  Phlebitis -     Begin naproxen (NAPROSYN) 500 MG tablet; Take 1 tablet (500 mg total) by  mouth 2 (two) times daily with a meal. She may use warm compresses several times per day to help with tenderness. NSAID's for pain and inflammation  Type 2 diabetes mellitus without complication, with long-term current use of insulin (HCC) -     insulin NPH-regular Human (NOVOLIN 70/30) (70-30) 100 UNIT/ML injection; Take 34 units of insulin in the morning and 34 units of insulin in  the evening I have increased her insulin. She will return in 1 week to see PharmD for additional changes. She may come back weekly until her surgery.     Due to language barrier, an interpreter was present during the history-taking and subsequent discussion (and for part of the physical exam) with this patient.  Return if symptoms worsen or fail to improve.   Lance Bosch, [place] [facility] [phone] 08/03/2015, 5:34 PM

## 2015-08-03 NOTE — Patient Instructions (Signed)
Flebitis (Phlebitis) La flebitis es el dolor y la hinchazn (inflamacin) de una vena. Ocurre en los brazos, las piernas o el torso (tronco), como tambin en el interior del [place]. Normalmente, la flebitis no es grave cuando ocurre cerca de la superficie del cuerpo. No obstante, puede causar problemas graves cuando ocurre en una vena en el interior del [place]. CAUSAS  Existen varios factores que pueden desencadenar la flebitis, incluidos los siguientes:   Flujo sanguneo reducido en las venas. Esto puede suceder debido a lo siguiente:  Reposo en cama por un largo perodo.  Viajes de Orthoptist.  Traumatismos.  Ciruga.  Sobrepeso (obesidad) o Media planner.  Va intravenosa (IV) en la vena a travs de la cual se reciben ciertos medicamentos.  Cncer o tratamiento para el cncer.  Uso de drogas ilegales que se administran por la vena.  Enfermedades inflamatorias.  Enfermedades hereditarias (genticas) que aumentan el riesgo de sufrir cogulos sanguneos.  Tratamientos hormonales, como las pldoras anticonceptivas. [place], sensible, hinchada y dolorosa en la piel. Normalmente, la zona es larga y Musician.  Dureza a lo largo del centro de la zona afectada. Esto puede indicar que se ha formado un cogulo sanguneo.  Fiebre no muy elevada. DIAGNSTICO  Un mdico normalmente puede diagnosticar la flebitis al examinar la zona afectada y hacerle preguntas sobre sus sntomas. Para detectar la presencia de alguna infeccin o de cogulos sanguneos, el mdico puede indicarle anlisis de sangre o una ecografa de la zona. Los anlisis de [place] y sus antecedentes familiares tambin pueden indicar la presencia de una enfermedad gentica preexistente que provoca cogulos sanguneos. Ocasionalmente, se extrae del cuerpo una pieza de tejido (muestra para la biopsia) si se sospecha una causa inusual de la flebitis. TRATAMIENTO  El tratamiento depender de la  gravedad del [place] y de la zona del cuerpo afectada. El tratamiento puede incluir:  Compresas calientes o una almohadilla trmica.  Medias de compresin o vendajes.  Medicamentos antiinflamatorios.  Remocin de la va intravenosa (IV) que pueda estar provocando el problema.  Medicamentos que [employer] grmenes (antibiticos) si hay infeccin.  Anticoagulantes si hay un cogulo sanguneo o se sospecha la presencia de uno.  En casos poco frecuentes, es posible que se necesite ciruga para eliminar las secciones daadas de la vena. [place] solo medicamentos de venta libre o recetados, [place] todos los medicamentos exactamente como se los recetaron.  [place] (eleve) la zona afectada por encima del nivel del corazn, segn las indicaciones del mdico.  Aplique una compresa caliente o una almohadilla trmica en la zona afectada, segn las indicaciones del mdico. No duerma con la almohadilla trmica.  Use medias de compresin o vendajes segn las indicaciones. Estos acelerarn la curacin y evitarn que el trastorno regrese.  Si est tomando anticoagulantes, tenga en cuenta lo siguiente:  Hgase anlisis de sangre de seguimiento segn las indicaciones del mdico.  Consulte con el mdico antes de tomar algn medicamento nuevo.  Lleve una tarjeta de alerta mdica o use su brazalete de alerta mdica para demostrar que est tomando anticoagulantes.  En el caso de flebitis en las piernas, haga lo siguiente:  Government social research officer largos perodos de pie o en la cama.  [place]. Eleve las piernas cuando est sentado o recostado.  No fume.  Las mujeres, especialmente las que tienen ms de 35aos, deben [place] y los beneficios de tomar  pldoras anticonceptivas. Este tipo de tratamiento hormonal puede aumentar el riesgo de sufrir cogulos sanguneos.  Concurra a las consultas de  control con su mdico segn las indicaciones. SOLICITE ATENCIN MDICA SI:   Nota hematomas inusuales o tiene hemorragias.  La hinchazn o el dolor en la zona afectada no mejoran.  Est tomando antiinflamatorios y comienza a Public affairs consultant vientre (abdominal). SOLICITE ATENCIN MDICA DE INMEDIATO SI:   Comienza a sentir sbitamente dolor en el pecho o dificultad para respirar.  Tiene fiebre o sntomas persistentes durante ms de 2a 3das.  Tiene fiebre y los sntomas empeoran repentinamente. ASEGRESE DE QUE:  Comprende estas instrucciones.  Controlar su afeccin.  Recibir ayuda de inmediato si no mejora o si empeora.   Esta informacin no tiene Marine scientist el consejo del mdico. Asegrese de hacerle al mdico cualquier pregunta que tenga.   Document Released: 03/30/2005 Document Revised: 04/10/2013 Elsevier Interactive Patient Education [employer].

## 2015-08-03 NOTE — Progress Notes (Signed)
Interpreter line used Julie Robertson ID# [id number] Patient complains of a "lump" to the back of her right leg  That has become bothersome

## 2015-08-11 ENCOUNTER — Encounter: Payer: Self-pay | Admitting: Pharmacist

## 2015-08-11 ENCOUNTER — Ambulatory Visit: Payer: Medicaid Other | Attending: Internal Medicine | Admitting: Pharmacist

## 2015-08-11 DIAGNOSIS — E119 Type 2 diabetes mellitus without complications: Secondary | ICD-10-CM

## 2015-08-11 DIAGNOSIS — Z794 Long term (current) use of insulin: Secondary | ICD-10-CM

## 2015-08-11 MED ORDER — INSULIN NPH ISOPHANE & REGULAR (70-30) 100 UNIT/ML ~~LOC~~ SUSP: SUBCUTANEOUS | Status: DC

## 2015-08-11 MED ORDER — INSULIN NPH ISOPHANE & REGULAR (70-30) 100 UNIT/ML ~~LOC~~ SUSP: 36.0000 [IU] | Freq: Two times a day (BID) | SUBCUTANEOUS | Status: DC

## 2015-08-11 NOTE — Progress Notes (Signed)
S:    Patient arrives in good spirits.  Presents for diabetes follow up. A virtual [place] interpreter was used for the entirety of the visit Ivin Booty [phone]).  Patient reports adherence with medications. Current diabetes medications include Humulin 70/30 35 units BID and metformin 1000 mg BID. Patient was supposed to be taking 34 units BID but self titrated to 35 units BID.  Patient denies hypoglycemic events.  Patient reported exercise habits: still not exercising   Patient reports nocturia 1-2 times per night.  Patient denies neuropathy. Patient reports chronic but not acute visual changes. Patient reports self foot exams.   Patient is awaiting surgery and it has been pushed back to later in February until her blood glucose is better controlled.   O:  Lab Results  Component Value Date   HGBA1C 9.0 07/23/2015   Home fasting CBG: 129-200s (most 160 and under) 2 hour post-prandial/random CBG: 140s - 200s (all randoms, most <180)  A/P: Diabetes currently uncontrolled based off of A1c of 9.0 and home CBG readings. Closer to goal for fastings. Patient denies hypoglycemic events and is able to verbalize appropriate hypoglycemia management plan.  Patient reports adherence with medication. Control is suboptimal due to dietary indiscretion and sedentary lifestyle.  Increased Humulin 70/30 to 36 units in the morning and 36 units in the evening with the focus of getting the fasting blood glucoses to goal. I think her 2 hour post-prandial readings are likely elevated (her randoms are collected right before her next meal). Patient to continue to monitor blood glucose at home and to contact us with any hypoglycemic readings.Continue metformin 1000 mg BID. Surgery planned for September 10, 2015 - I think we will be able to get her blood glucose to goal by that time.  Next A1C anticipated April 2017.    Written patient instructions provided. Total time in face to face counseling 30 minutes.   Follow up in Pharmacist Clinic Visit in 1 weeks for insulin titration.

## 2015-08-11 NOTE — Patient Instructions (Addendum)
Increase insulin to 37 units twice daily  Let me know if you have readings less than 80  Come back in 1-2 weeks for insulin titration

## 2015-08-25 ENCOUNTER — Ambulatory Visit: Payer: Medicaid Other | Attending: Internal Medicine | Admitting: Pharmacist

## 2015-08-25 ENCOUNTER — Encounter: Payer: Self-pay | Admitting: Pharmacist

## 2015-08-25 DIAGNOSIS — Z794 Long term (current) use of insulin: Secondary | ICD-10-CM

## 2015-08-25 DIAGNOSIS — E119 Type 2 diabetes mellitus without complications: Secondary | ICD-10-CM

## 2015-08-25 LAB — POCT GLYCOSYLATED HEMOGLOBIN (HGB A1C): Hemoglobin A1C: 8.3

## 2015-08-25 NOTE — Progress Notes (Signed)
S:    Patient arrives in good spirits.  Presents for diabetes follow up. A virtual [place] interpreter was used for the entirety of the visit Wells Guiles [phone]).  Patient reports adherence with medications. Current diabetes medications include Humulin 70/30 37 units BID and metformin 1000 mg BID.  Patient denies hypoglycemic events.  Patient reported exercise habits: still not exercising   Patient denies nocturia 1-2 times per night.  Patient denies neuropathy. Patient reports chronic but not acute visual changes. Patient reports self foot exams.   Patient is awaiting surgery in March.   O:  Lab Results  Component Value Date   HGBA1C 8.3 08/25/2015   Home fasting CBG: 100s-140s 2 hour post-prandial/random CBG: 100s-190s  7 day average = 143 14 day average = 139 30 day average = 150  A/P: Diabetes currently uncontrolled based off of A1c of 8.3 but under improved control based on home CBGs and improved A1c. Closer to goal for fastings. Patient denies hypoglycemic events and is able to verbalize appropriate hypoglycemia management plan.  Patient reports adherence with medication. Control is suboptimal due to dietary indiscretion and sedentary lifestyle.  Continued Humulin 70/30  37 units in the morning and 37 units in the evening and metformin 1000 mg BID. Patient to continue to monitor blood glucose at home and return if she has any hypoglycemia or if her blood glucose is elevated to the 200s again. Otherwise, she can follow up with Chari Manning, NP, as directed and proceed with surgery in March.  Next A1C anticipated May 2017. Patient requests one today so will obtain for the purposes of her surgery. I expect her next A1c to be <8 if she continues to maintain this level of control.  Written patient instructions provided. Total time in face to face counseling 30 minutes.  Follow up in Pharmacist Clinic Visit as needed.

## 2015-08-25 NOTE — Patient Instructions (Signed)
Your blood sugars look great!  Follow up with Julie Robertson as directed  Hipoglucemia (Hypoglycemia) Un bajo nivel de azcar en la sangre (hipoglucemia) implica que es menor de lo que debera ser. Algunos signos de hipoglucemia son:  Transpiracin abundante.  Aumento del apetito.  Sensacin de [employer] o debilidad.  Sentir ms sueo que lo habitual.  Nerviosismo.  Dolor de [place].  Pulsaciones rpidas. El nivel de azcar en sangre puede descender rpidamente y puede ser Engineer, maintenance (IT). Su mdico puede hacerle estudios para Public house manager. Puede ser que tenga hipoglucemia y no sea diabtico. CUIDADOS EN EL HOGAR  Controle el nivel de azcar en la sangre como lo indique su mdico. Si es menor de 70 mg / dl , o el que le indique su mdico, tome una de las siguientes medidas:  3  4 tabletas de glucosa.   taza de jugo liviano.   taza de gaseosa que no sea diettica.  [place]  5  6 caramelos duros.  Vuelva a controlarse despus de 15 minutos. Repita hasta que el nivel sea el [place].  Tome una colacin si falta ms de 1 hora hasta la prxima comida.  Tome los medicamentos tal como se los prescribi el mdico.  No saltee las comidas. Coma siempre a la misma hora.  Debe evitar las bebidas alcohlicas, excepto con las comidas.  Controle su nivel de glucosa antes de conducir.  Controle su nivel de glucosa antes y despus de hacer actividad fsica.  Lleve siempre los medicamentos con usted,como por ejemplo las pastillas para la glucosa.  Siempre use un brazalete de alerta mdica si es diabtico. SOLICITE AYUDA DE INMEDIATO SI:   El nivel de glucosa desciende por debajo de 70 mg / dl o el que le indique su mdico, y:  Se siente confundido  No puede tragar.  Se desmaya.  No puede solucionarlo por sus propios medios. Puede ser necesario que alguien lo ayude.  Este problema le ocurre con frecuencia.  Tiene algn problema con los medicamentos.  No  se siente bien despus de 3  4 das.  Observa cambios en la visin. ASEGRESE DE QUE:   Comprende estas instrucciones.  Controlar la enfermedad.  Solicitar ayuda de inmediato si no mejora o empeora.   Esta informacin no tiene Marine scientist el consejo del mdico. Asegrese de hacerle al mdico cualquier pregunta que tenga.   Document Released: 07/23/2010 Document Revised: 07/11/2014 Elsevier Interactive Patient Education [employer].

## 2015-08-28 DIAGNOSIS — J45909 Unspecified asthma, uncomplicated: Secondary | ICD-10-CM | POA: Insufficient documentation

## 2015-09-01 DIAGNOSIS — D649 Anemia, unspecified: Secondary | ICD-10-CM | POA: Insufficient documentation

## 2015-09-02 DIAGNOSIS — D509 Iron deficiency anemia, unspecified: Secondary | ICD-10-CM | POA: Insufficient documentation

## 2015-09-02 HISTORY — PX: OTHER SURGICAL HISTORY: SHX169

## 2015-09-08 ENCOUNTER — Telehealth: Payer: Self-pay | Admitting: Internal Medicine

## 2015-09-08 DIAGNOSIS — E119 Type 2 diabetes mellitus without complications: Secondary | ICD-10-CM

## 2015-09-08 DIAGNOSIS — E139 Other specified diabetes mellitus without complications: Secondary | ICD-10-CM

## 2015-09-08 DIAGNOSIS — E785 Hyperlipidemia, unspecified: Secondary | ICD-10-CM

## 2015-09-08 MED ORDER — GABAPENTIN 300 MG PO CAPS: 300.0000 mg | ORAL_CAPSULE | Freq: Three times a day (TID) | ORAL | Status: DC

## 2015-09-08 MED ORDER — METFORMIN HCL 1000 MG PO TABS: 1000.0000 mg | ORAL_TABLET | Freq: Two times a day (BID) | ORAL | Status: DC

## 2015-09-08 MED ORDER — ATORVASTATIN CALCIUM 10 MG PO TABS
10.0000 mg | ORAL_TABLET | Freq: Every day | ORAL | Status: DC
Start: 2015-09-08 — End: 2016-04-28

## 2015-09-08 NOTE — Telephone Encounter (Signed)
Patient called requesting a medication refill for Insulin, metformin, gabapentin, atorvastatin.  Patient would like medicine by Thursday

## 2015-09-10 DIAGNOSIS — D135 Benign neoplasm of extrahepatic bile ducts: Secondary | ICD-10-CM | POA: Insufficient documentation

## 2015-09-22 ENCOUNTER — Ambulatory Visit: Payer: Medicaid Other | Attending: Internal Medicine

## 2015-09-23 ENCOUNTER — Ambulatory Visit: Payer: Self-pay | Attending: Internal Medicine | Admitting: Internal Medicine

## 2015-09-23 ENCOUNTER — Ambulatory Visit (HOSPITAL_COMMUNITY)
Admission: RE | Admit: 2015-09-23 | Discharge: 2015-09-23 | Disposition: A | Discharge status: Home / Self Care | Payer: Self-pay | Source: Ambulatory Visit | Attending: Internal Medicine | Admitting: Internal Medicine

## 2015-09-23 ENCOUNTER — Encounter: Payer: Self-pay | Admitting: Internal Medicine

## 2015-09-23 VITALS — BP 110/75 | HR 68 | Temp 98.0°F | Resp 16 | Ht 65.0 in | Wt 255.0 lb

## 2015-09-23 DIAGNOSIS — J9811 Atelectasis: Secondary | ICD-10-CM | POA: Insufficient documentation

## 2015-09-23 DIAGNOSIS — J810 Acute pulmonary edema: Secondary | ICD-10-CM

## 2015-09-23 IMAGING — CR DG CHEST 2V
2 series · 2 of 2 positions shown · non-contrast
Comparison: CT angio chest of [DATE] and chest x-ray of
[DATE]

CLINICAL DATA: Shortness of breath, history of abdominal surgery 2
weeks ago

EXAM:
CHEST  2 VIEW

[chest pa]
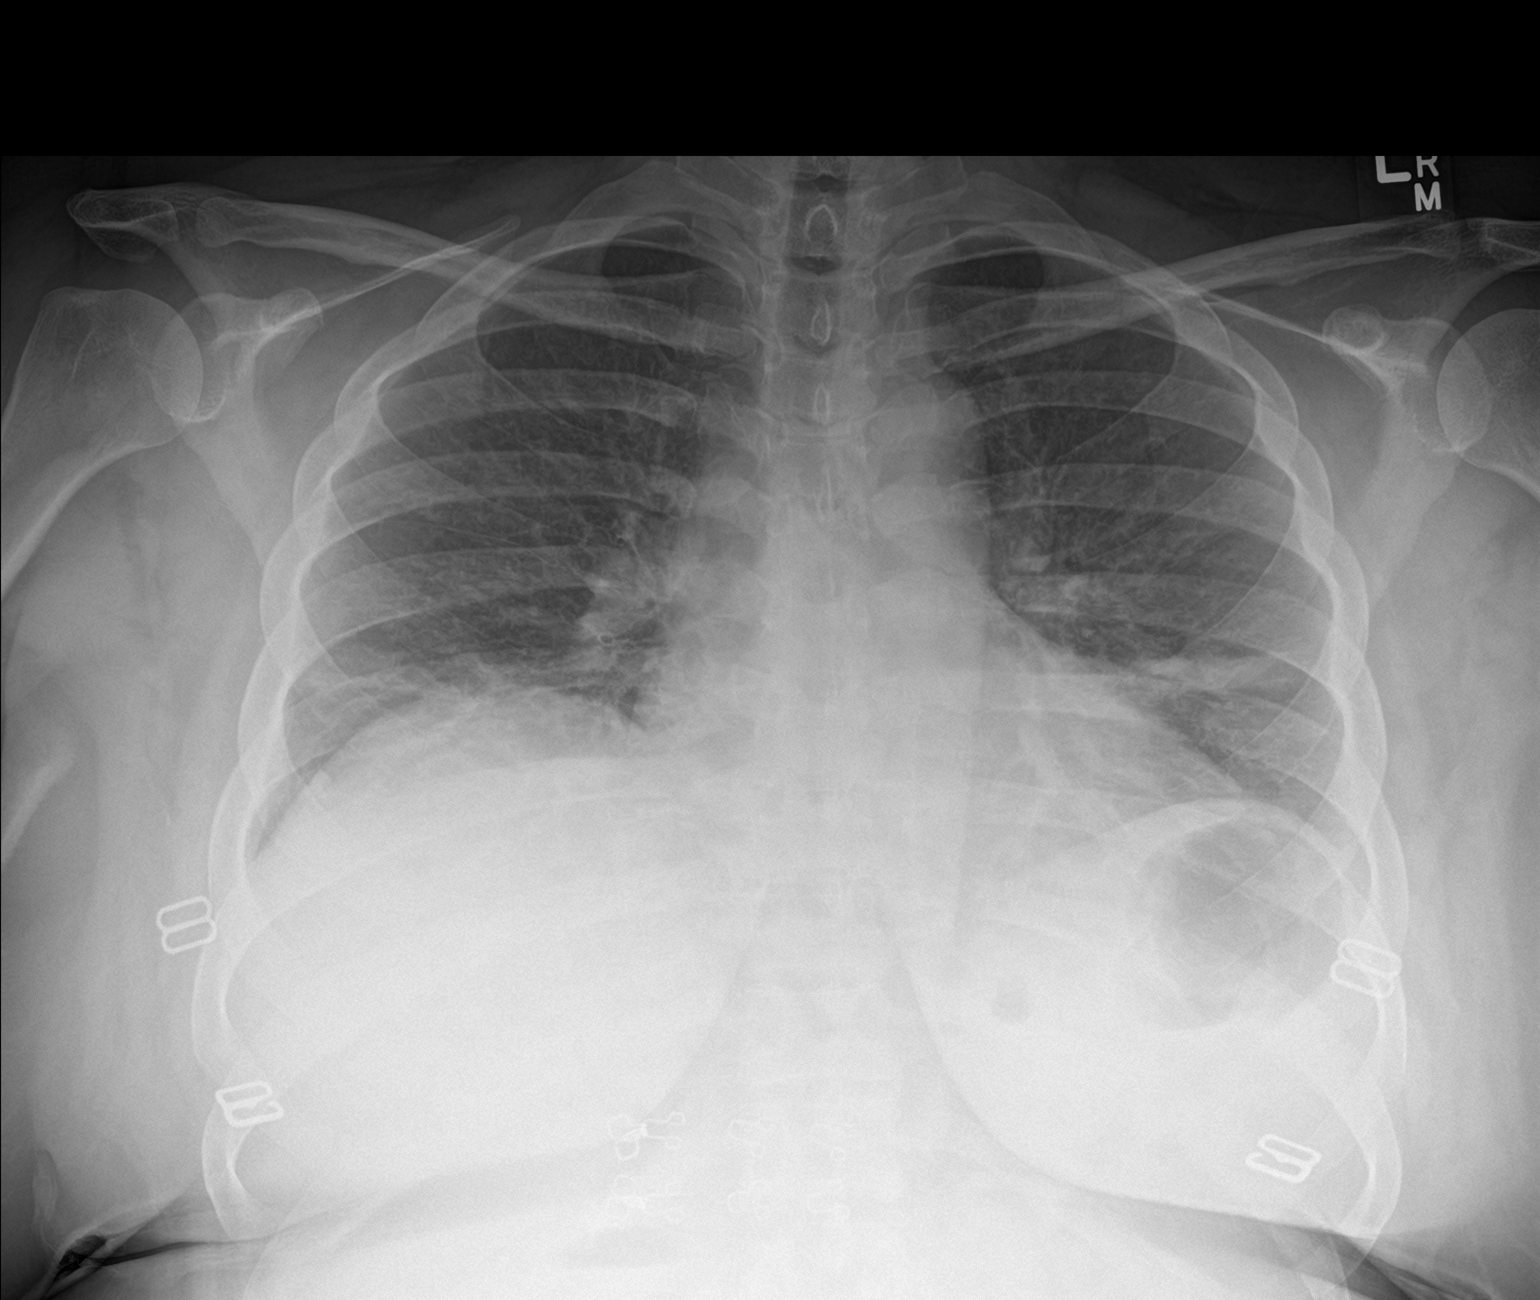

[chest lat]
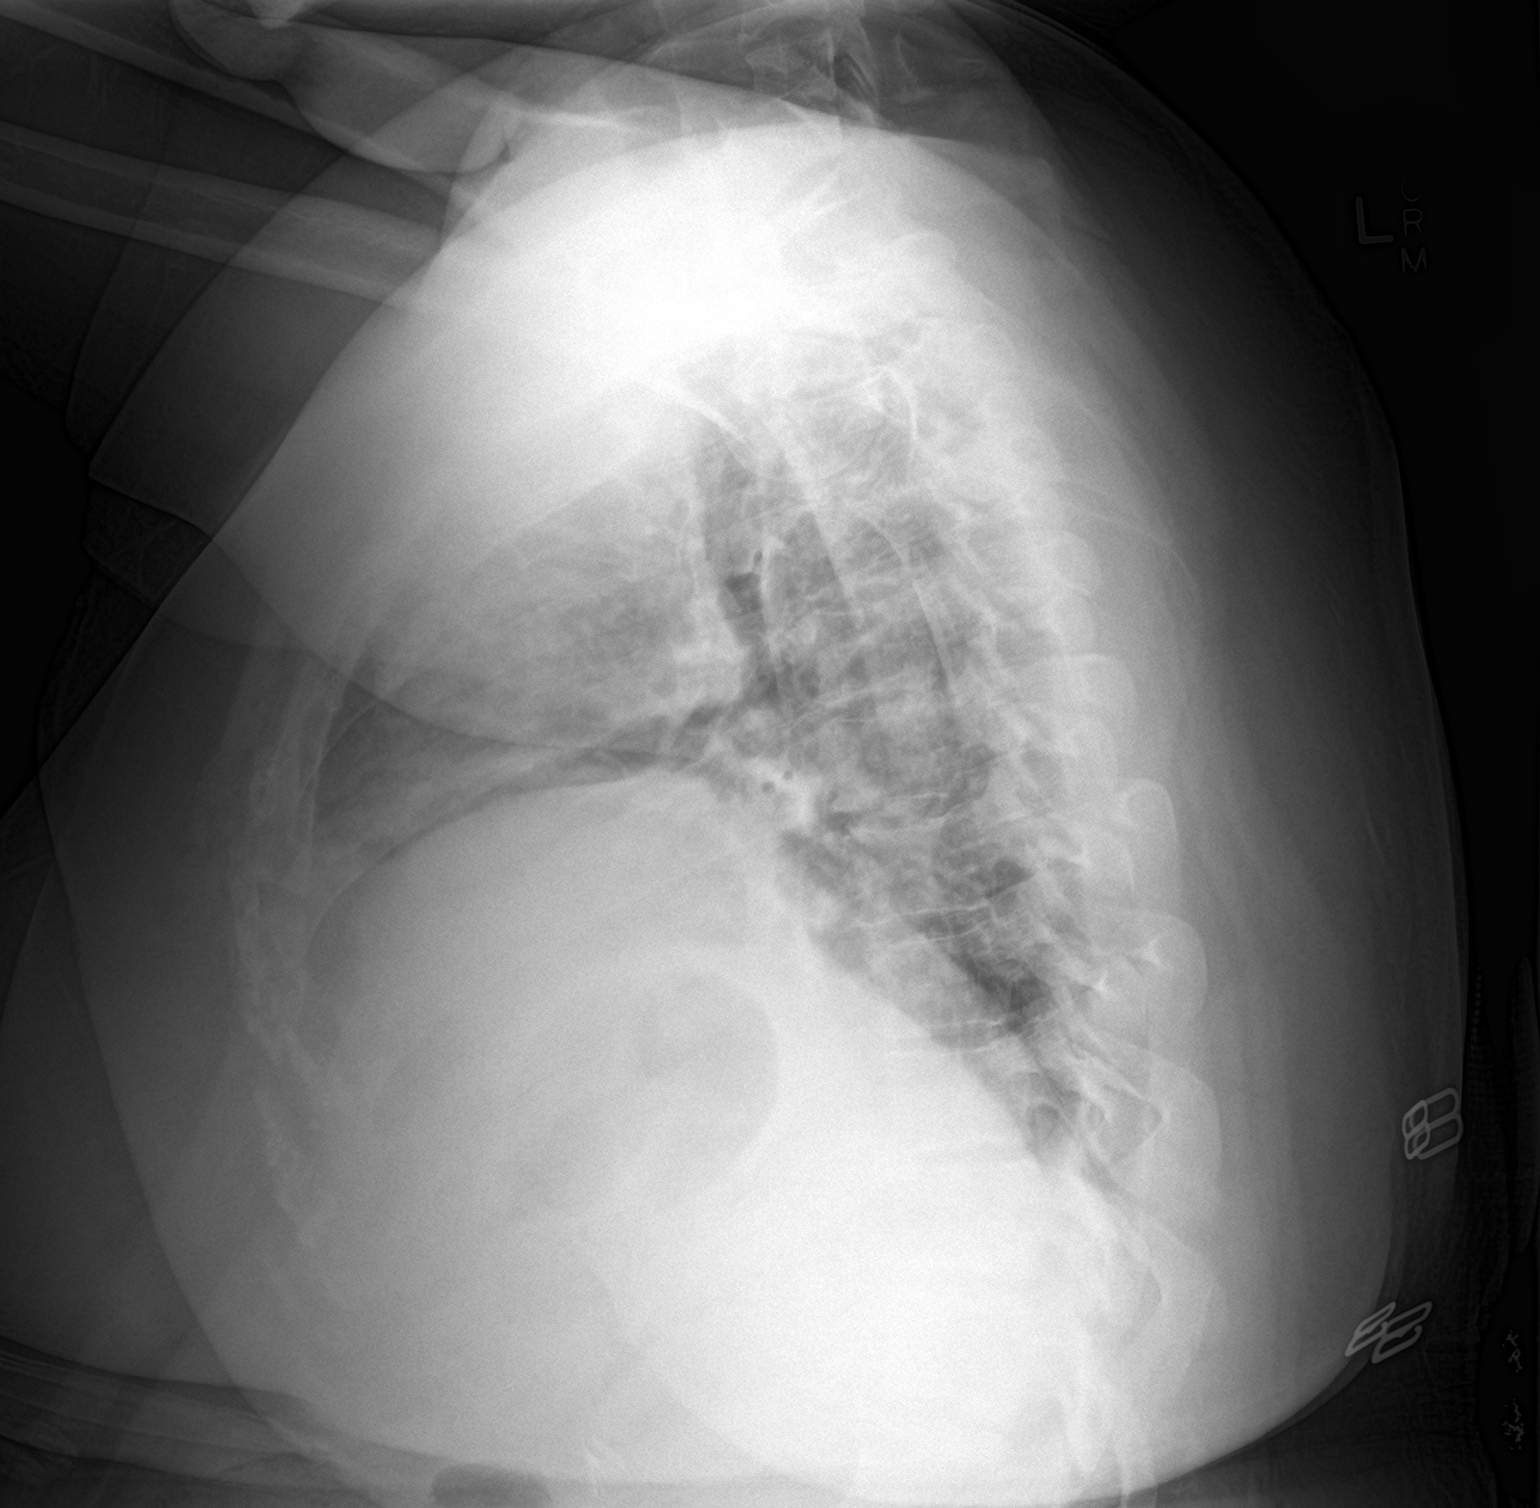

[2 of 2 positions shown; findings below may reference images not displayed]

FINDINGS: The lungs are not optimally aerated and there is bibasilar linear
atelectasis present. No definite pneumonia or effusion is seen.
Mediastinal and hilar contours are unremarkable. The heart is within
normal limits in size. No bony abnormality is seen.
IMPRESSION: Suboptimal inspiration with increase in bibasilar linear
atelectasis.

## 2015-09-23 NOTE — Progress Notes (Signed)
Patient ID: Julie Robertson, female   DOB: Oct 22, 1975, 40 y.o.   MRN: [id number]  CC: HFU  HPI: Julie Robertson is a 40 y.o. female here today for a follow up visit.  Patient has past medical history of diabetes, dyslipidemia, neuropathy, and obesity. 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 She still has the surgical dressing in place. Review of EMR reveals that patients hospital course was complicated by pulmonary edema and she was later discharged on home oxygen.  She states that she was told she needed to follow up with her PCP to have the oxygen discontinued. She reports that she has not been using her spirometer and only feels pain when she yarns. She has no SOB or chest pain currently. She is currently on 2 L of oxygen. Review of EMR also reveals that she has a pulmonology referral in place with a scheduled appointment on 10/28/15.   No Known Allergies Past Medical History  Diagnosis Date  . Diabetes mellitus without complication ([place])   . Dyslipidemia 08/2009  . Gall stones 08/2009  . 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  numbness tingling in right fingers. rx with Gabapentin.  . Obesity     BMI 41, 250# 03/2015   Current Outpatient Prescriptions on File Prior to Visit  Medication Sig Dispense Refill  . albuterol (PROVENTIL HFA;VENTOLIN HFA) 108 (90 BASE) MCG/ACT inhaler Inhale 2 puffs into the lungs every 6 (six) hours as needed for wheezing or shortness of breath. (Patient not taking: Reported on 08/03/2015) 1 Inhaler 2  . atorvastatin (LIPITOR) 10 MG tablet Take 1 tablet (10 mg total) by mouth daily at 6 PM. 90 tablet 3  . Blood Glucose Monitoring Suppl (TRUE METRIX METER) W/DEVICE KIT Use as directed 1 kit 0  . gabapentin (NEURONTIN) 300 MG capsule Take 1 capsule (300 mg total) by mouth 3 (three) times daily. 270 capsule 3  . glucose blood (TRUE METRIX BLOOD GLUCOSE  TEST) test strip Use as instructed 100 each 12  . ibuprofen (ADVIL,MOTRIN) 200 MG tablet Take 400 mg by mouth every 6 (six) hours as needed for moderate pain.    Marland Kitchen insulin NPH-regular Human (NOVOLIN 70/30) (70-30) 100 UNIT/ML injection Take 37 units of insulin in the morning and 37 units of insulin in the evening 30 mL 2  . Insulin Pen Needle 31G X 5 MM MISC Inject 10 units subcutaneous at bedtime 100 each 2  . metFORMIN (GLUCOPHAGE) 1000 MG tablet Take 1 tablet (1,000 mg total) by mouth 2 (two) times daily with a meal. 180 tablet 3  . miconazole (MICATIN) 2 % cream Apply 1 application topically 2 (two) times daily. 28.35 g 0  . naproxen (NAPROSYN) 500 MG tablet Take 1 tablet (500 mg total) by mouth 2 (two) times daily with a meal. 30 tablet 0  . TRUEPLUS LANCETS 28G MISC Use as directed 100 each 12   No current facility-administered medications on file prior to visit.   Family History  Problem Relation Age of Onset  . Diabetes Mother   . Heart disease Mother   . Cancer Mother   . Cancer Father   . Diabetes Father   . Heart disease Father    Social History   Social History  . Marital Status: Legally Separated    Spouse Name: N/A  . Number of Children: N/A  . Years of Education: N/A  Occupational History  . Not on file.   Social History Main Topics  . Smoking status: Current Every Day Smoker -- 1.50 packs/day for 4 years    Types: Cigarettes  . Smokeless tobacco: Not on file  . Alcohol Use: Yes     Comment: socially  . Drug Use: No  . Sexual Activity: Not on file   Other Topics Concern  . Not on file   Social History Narrative   Patient has 2 sons one is 93, the other 25 as of 03/2015. Her kids are not the children of her current husband. As of 03/2015 she is not employed outside the home.    Review of Systems: Constitutional: Negative for fever, chills, diaphoresis, activity change, appetite change and fatigue. HENT: Negative for ear pain, nosebleeds, congestion,  facial swelling, rhinorrhea, neck pain, neck stiffness and ear discharge.  Eyes: Negative for pain, discharge, redness, itching and visual disturbance. Respiratory: Negative for cough, choking, chest tightness, shortness of breath, wheezing and stridor.  Cardiovascular: Negative for chest pain, palpitations and leg swelling. Gastrointestinal: Negative for abdominal distention. Genitourinary: Negative for dysuria, urgency, frequency, hematuria, flank pain, decreased urine volume, difficulty urinating and dyspareunia.  Musculoskeletal: Negative for back pain, joint swelling, arthralgias and gait problem. Neurological: Negative for dizziness, tremors, seizures, syncope, facial asymmetry, speech difficulty, weakness, light-headedness, numbness and headaches.  Hematological: Negative for adenopathy. Does not bruise/bleed easily. Psychiatric/Behavioral: Negative for hallucinations, behavioral problems, confusion, dysphoric mood, decreased concentration and agitation.    Objective:   Filed Vitals:   09/23/15 1458  BP: 110/75  Pulse: 68  Temp: 98 F (36.7 C)  Resp: 16    Physical Exam  Cardiovascular: Normal rate, regular rhythm and normal heart sounds.   Pulmonary/Chest: Effort normal and breath sounds normal. She has no wheezes. She has no rales.  Abdominal: There is tenderness.  Musculoskeletal: She exhibits no edema.  Skin: Skin is warm and dry.     Midline surgical dressing in place  Psychiatric: She has a normal mood and affect.     Lab Results  Component Value Date   WBC 7.5 05/14/2015   HGB 10.4* 05/14/2015   HCT 35.3* 05/14/2015   MCV 69.5* 05/14/2015   PLT 372 05/14/2015   Lab Results  Component Value Date   CREATININE 0.74 05/14/2015   BUN 16 05/14/2015   NA 139 05/14/2015   K 4.0 05/14/2015   CL 106 05/14/2015   CO2 24 05/14/2015    Lab Results  Component Value Date   HGBA1C 8.3 08/25/2015   Lipid Panel     Component Value Date/Time   CHOL 142  12/05/2013 0941   TRIG 113 12/05/2013 0941   HDL 35* 12/05/2013 0941   CHOLHDL 4.1 12/05/2013 0941   VLDL 23 12/05/2013 0941   LDLCALC 84 12/05/2013 0941       Assessment and plan:   Julie Robertson was seen today for follow-up.  Diagnoses and all orders for this visit:  Acute pulmonary edema ([place]) -     DG Chest 2 View; Future 6 minute walk test on room air---Oxygen sat only dropped to 95%. Let patient sit throughout exam on room air and she maintained at a 98%. I will repeat chest xray and d/c oxygen if it is shows resolved pulmonary edema. I have encouraged patient to use her incentive spirometer at home 10 times per hour to help with lung expansion. Patient verbalizes understanding  Return in about 6 weeks (around 11/04/2015) for Diabetes Mellitus.  Lance Bosch, [facility] [phone] 09/23/2015, 3:15 PM

## 2015-09-23 NOTE — Progress Notes (Signed)
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

## 2015-09-24 ENCOUNTER — Telehealth: Payer: Self-pay

## 2015-09-24 NOTE — Telephone Encounter (Signed)
-----   Message from Lance Bosch, NP sent at 09/24/2015  1:41 PM EDT ----- Chest xray still shows that she is not expanding her lungs fully but pulmonary edema has resolved. I would like her to work on the spirometer 10 times every hour for the next 2 weeks. She should be fine to discontinue oxygen use

## 2015-09-24 NOTE — Telephone Encounter (Signed)
Interpreter line used Julie Robertson ID# [phone] Called and spoke with patient  Patient is aware of  Her x ray results and to continue to use The spirometer ten times every hour for the next two weeks

## 2015-09-28 ENCOUNTER — Ambulatory Visit: Payer: Self-pay | Attending: Internal Medicine | Admitting: Pharmacist

## 2015-09-28 DIAGNOSIS — Z794 Long term (current) use of insulin: Secondary | ICD-10-CM | POA: Insufficient documentation

## 2015-09-28 DIAGNOSIS — E119 Type 2 diabetes mellitus without complications: Secondary | ICD-10-CM | POA: Insufficient documentation

## 2015-09-28 DIAGNOSIS — Z79899 Other long term (current) drug therapy: Secondary | ICD-10-CM | POA: Insufficient documentation

## 2015-09-28 NOTE — Patient Instructions (Signed)
Thanks for coming to see me!  Stop the insulin - let me know if you see blood sugars over 200 - we will need a lower dose of the insulin then  Continue metformin  Come back in 2 weeks with your meter  Hipoglucemia (Hypoglycemia) Un bajo nivel de azcar en la sangre (hipoglucemia) implica que es menor de lo que debera ser. Algunos signos de hipoglucemia son:  Transpiracin abundante.  Aumento del apetito.  Sensacin de [employer] o debilidad.  Sentir ms sueo que lo habitual.  Nerviosismo.  Dolor de [place].  Pulsaciones rpidas. El nivel de azcar en sangre puede descender rpidamente y puede ser Engineer, maintenance (IT). Su mdico puede hacerle estudios para Public house manager. Puede ser que tenga hipoglucemia y no sea diabtico. CUIDADOS EN EL HOGAR  Controle el nivel de azcar en la sangre como lo indique su mdico. Si es menor de 70 mg / dl , o el que le indique su mdico, tome una de las siguientes medidas:  3  4 tabletas de glucosa.   taza de jugo liviano.   taza de gaseosa que no sea diettica.  [place]  5  6 caramelos duros.  Vuelva a controlarse despus de 15 minutos. Repita hasta que el nivel sea el [place].  Tome una colacin si falta ms de 1 hora hasta la prxima comida.  Tome los medicamentos tal como se los prescribi el mdico.  No saltee las comidas. Coma siempre a la misma hora.  Debe evitar las bebidas alcohlicas, excepto con las comidas.  Controle su nivel de glucosa antes de conducir.  Controle su nivel de glucosa antes y despus de hacer actividad fsica.  Lleve siempre los medicamentos con usted,como por ejemplo las pastillas para la glucosa.  Siempre use un brazalete de alerta mdica si es diabtico. SOLICITE AYUDA DE INMEDIATO SI:   El nivel de glucosa desciende por debajo de 70 mg / dl o el que le indique su mdico, y:  Se siente confundido  No puede tragar.  Se desmaya.  No puede solucionarlo por sus propios medios.  Puede ser necesario que alguien lo ayude.  Este problema le ocurre con frecuencia.  Tiene algn problema con los medicamentos.  No se siente bien despus de 3  4 das.  Observa cambios en la visin. ASEGRESE DE QUE:   Comprende estas instrucciones.  Controlar la enfermedad.  Solicitar ayuda de inmediato si no mejora o empeora.   Esta informacin no tiene Marine scientist el consejo del mdico. Asegrese de hacerle al mdico cualquier pregunta que tenga.   Document Released: 07/23/2010 Document Revised: 07/11/2014 Elsevier Interactive Patient Education [employer].

## 2015-09-28 NOTE — Progress Notes (Signed)
S:    Patient arrives in good spirits.  Presents for diabetes follow up. A virtual [place] Spanish interpreter was used for the entirety of the visit Neill Loft [phone]).  Patient denies adherence with medications. Current diabetes medications include Humulin 70/30 37 units BID and metformin 1000 mg BID.  Patient denies hypoglycemic events.  Patient reported exercise habits: still not exercising   Patient denies nocturia.  Patient denies neuropathy. Patient reports chronic but not acute visual changes. Patient reports self foot exams.   She had her surgery and her blood sugars have greatly decreased since then. She has taken her insulin a few times over the last week (but would only take 17 units or maybe 15 units - never the 37 units) or two but she has gone many days without taking it. She has also not taken her metformin every day. She has been worried that she would get too low.    O:  Lab Results  Component Value Date   HGBA1C 8.3 08/25/2015   Home fasting CBG: 80s-110s 2 hour post-prandial/random CBG: 100s-150s  A/P: Diabetes currently uncontrolled based off of A1c of 8.3 but under improved control based on home CBGs. Patient denies hypoglycemic events and is able to verbalize appropriate hypoglycemia management plan.  Patient denies adherence with medication.   Patient's blood glucose has been controlled without the insulin the last week or so. I do understand her fear of hypoglycemia since she is controlled without the insulin. I would be surprised if she didn't need it at all but maybe the adenoma and the stress of the surgery was really driving her blood glucose high.   Because she has been controlled off of insulin the last few days and she hasn't been taking her metformin - patient will continue to hold insulin and restart metformin. She will monitor her blood glucose over the next week and then come back to see me for a CBG review. She will let me know if she has hypoglycemia or  CBGs>200 at which point we will restart the insulin. She verbalized understanding and was able to teach-back the plan to me.   Next A1C anticipated May 2017. I expect her next A1c to be <8 if she continues to maintain this level of control.  Written patient instructions provided. Total time in face to face counseling 30 minutes.  Follow up in Pharmacist Clinic Visit in 2 weeks. Marland Kitchen

## 2015-10-06 ENCOUNTER — Encounter: Payer: Self-pay | Admitting: Pharmacist

## 2015-10-08 ENCOUNTER — Ambulatory Visit: Payer: Self-pay | Attending: Internal Medicine | Admitting: Pharmacist

## 2015-10-08 DIAGNOSIS — E119 Type 2 diabetes mellitus without complications: Secondary | ICD-10-CM | POA: Insufficient documentation

## 2015-10-08 DIAGNOSIS — Z7984 Long term (current) use of oral hypoglycemic drugs: Secondary | ICD-10-CM | POA: Insufficient documentation

## 2015-10-08 NOTE — Patient Instructions (Signed)
Thanks for coming to see [place]!  Come back in 2 weeks for a blood sugar log review  Recuento bsico de carbohidratos para la diabetes mellitus (Basic Carbohydrate Counting for Diabetes Mellitus) El recuento de carbohidratos es un mtodo destinado a calcular la cantidad de carbohidratos en la dieta. El consumo de carbohidratos aumenta naturalmente el nivel de azcar (glucosa) en la sangre, por lo que es importante que sepa la cantidad que debe incluir en cada comida. El recuento de carbohidratos ayuda a Advertising account executive de glucosa en la sangre dentro de los lmites normales. La cantidad permitida de carbohidratos es diferente para cada persona. Un nutricionista puede ayudarlo a calcular la cantidad adecuada para usted. Una vez que sepa la cantidad de carbohidratos que puede consumir, podr calcular los carbohidratos de los alimentos que desea comer. Los siguientes alimentos incluyen carbohidratos:  Granos, como panes y cereales.  Frijoles secos y productos con soja.  Vegetales almidonados, como papas, guisantes y maz.  Lambert Mody y jugos de frutas.  [place] y Estate agent.  Dulces y bocadillos, como pastel, galletas, caramelos, papas fritas de bolsa, refrescos y bebidas frutales con azcar. RECUENTO DE CARBOHIDRATOS [employer] de calcular los carbohidratos de los alimentos. Puede usar cualquiera de los dos mtodos o [place] combinacin de [place]. Leer la etiqueta de informacin nutricional de los alimentos envasados La informacin nutricional es una etiqueta incluida en casi todas las bebidas y los alimentos envasados de los [place]. Indica el tamao de la porcin de ese alimento o bebida e informacin sobre los nutrientes de cada porcin, incluso los gramos (g) de carbohidratos por porcin.  Decida la cantidad de porciones que comer o tomar de este alimento o bebida. Multiplique la cantidad de porciones por el nmero de gramos de carbohidratos indicados en la etiqueta para esa porcin. El total  ser la cantidad de carbohidratos que consumir al comer ese alimento o tomar esa bebida. Conocer las porciones estndar de los alimentos Cuando coma alimentos no envasados o que no incluyan la informacin nutricional en la etiqueta, deber medir las porciones para poder calcular la cantidad de carbohidratos. Una porcin de la mayora de los alimentos ricos en carbohidratos contiene alrededor de 15g de carbohidratos. La siguiente [employer] tamaos de porcin de los alimentos ricos en carbohidratos que contienen alrededor de 15g de carbohidratos por porcin:   1rebanada de pan (1oz) o 1tortilla de seis pulgadas.  panecillo de hamburguesa o bollito tipo ingls.  4a 6galletas.   de taza de cereal sin azcar y seco.   taza de cereal caliente.   de taza de arroz o pastas.  taza de pur de papas o de una papa grande al horno.  1taza de frutas frescas o una fruta pequea.  taza de frutas o jugo de frutas enlatados o congelados.  [place].   de taza de yogur descremado sin ningn agregado o de yogur endulzado con edulcorante artificial.  taza de vegetales almidonados, como guisantes, maz o papas, o de frijoles secos cocidos. Decida la cantidad de porciones [employer]. Multiplique la cantidad de porciones por 15 (los gramos de carbohidratos en esa porcin). Por ejemplo, si come 2tazas de fresas, habr comido 2porciones y 30g de carbohidratos (2porciones x 15g = 30g). [place] y guisos, en las que se mezcla ms de un alimento, deber [employer] carbohidratos de cada alimento incluido. EJEMPLO DE RECUENTO DE CARBOHIDRATOS Ejemplo de cena  3 onzas de pechugas de pollo.   de  taza de [place].  1 taza de fresas con crema batida sin azcar. Clculo de [place] 1: Identifique los alimentos que contienen carbohidratos:   Arroz.  Maz.  [place].  Hughie Closs. [place] 2:  Calcule el nmero de porciones que consumir de cada uno:   2 porciones de Occupational psychologist.  1 porcin de maz.  [place].  1 porcin de fresas. [place] 3: Multiplique cada una de esas porciones por 15g:   2 porciones de arroz x 15 g = 30 g.  1 porcin de maz x 15 g = 15 g.  1 porcin de leche x 15 g = 15 g.  1 porcin de fresas x 15 g = 15 g. [place] 4: Sume todas las cantidades para Armed forces logistics/support/administrative officer total de gramos de carbohidratos consumidos: 30 g + 15 g + 15 g + 15 g = 75 g.   Esta informacin no tiene Marine scientist el consejo del mdico. Asegrese de hacerle al mdico cualquier pregunta que tenga.   Document Released: 09/12/2011 Document Revised: 07/11/2014 Elsevier Interactive Patient Education [employer].

## 2015-10-08 NOTE — Progress Notes (Signed)
S:    Patient arrives in good spirits.  Presents for diabetes follow up. A virtual [place] Spanish interpreter was used for the entirety of the visit Julie Robertson [phone]).  Patient denies adherence with medications. Current diabetes medications include metformin 1000 mg BID. She has not been on insulin since her last visit.   Patient denies hypoglycemic events.  Patient reported exercise habits: still not exercising due to pain from the surgery.   Patient denies nocturia.  Patient denies neuropathy. Patient reports chronic but not acute visual changes. Patient reports self foot exams.   Overall, she reports that she is feeling very well.   O:  Lab Results  Component Value Date   HGBA1C 8.3 08/25/2015   Home fasting CBG: 132, 148, 132, 178, 173, 154 2 hour post-prandial/random CBG: 145, 175, 152, 168  A/P: Diabetes currently uncontrolled based off of A1c of 8.3 but under improved control based on home CBGs. Patient denies hypoglycemic events and is able to verbalize appropriate hypoglycemia management plan.  Patient reports adherence with medication. 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  Continue metformin 1000 mg BID. Continue to hold the insulin for now. Patient to focus on diet modification. She will monitor her blood glucose over the next week and then come back to see me for a CBG review. She will let me know if she has hypoglycemia or CBGs>200 at which point we will restart the insulin or consider other medications. She verbalized understanding and was able to teach-back the plan to me.   Next A1C anticipated May 2017. I expect her next A1c to be <8 if she continues to maintain this level of control.  Written patient instructions provided, including information on basic carbohydrate counting. Total time in face to face counseling 30 minutes.  Follow up in Pharmacist Clinic Visit in 2 weeks.  Patient seen with Jamie Brookes, PharmD Candidate

## 2015-10-15 ENCOUNTER — Other Ambulatory Visit: Payer: Self-pay | Admitting: Internal Medicine

## 2015-10-29 ENCOUNTER — Ambulatory Visit: Payer: Self-pay | Admitting: Pharmacist

## 2016-04-28 ENCOUNTER — Telehealth: Payer: Self-pay | Admitting: Internal Medicine

## 2016-04-28 ENCOUNTER — Other Ambulatory Visit: Payer: Self-pay | Admitting: Internal Medicine

## 2016-04-28 DIAGNOSIS — Z794 Long term (current) use of insulin: Principal | ICD-10-CM

## 2016-04-28 DIAGNOSIS — E119 Type 2 diabetes mellitus without complications: Secondary | ICD-10-CM

## 2016-04-28 DIAGNOSIS — E118 Type 2 diabetes mellitus with unspecified complications: Secondary | ICD-10-CM

## 2016-04-28 DIAGNOSIS — E785 Hyperlipidemia, unspecified: Secondary | ICD-10-CM

## 2016-04-28 MED ORDER — INSULIN NPH ISOPHANE & REGULAR (70-30) 100 UNIT/ML ~~LOC~~ SUSP: SUBCUTANEOUS | 0 refills | Status: DC

## 2016-04-28 MED ORDER — INSULIN PEN NEEDLE 31G X 5 MM MISC: 0 refills | Status: DC

## 2016-04-28 MED ORDER — ATORVASTATIN CALCIUM 10 MG PO TABS: 10.0000 mg | ORAL_TABLET | Freq: Every day | ORAL | 0 refills | Status: DC

## 2016-04-28 MED ORDER — GABAPENTIN 300 MG PO CAPS: 300.0000 mg | ORAL_CAPSULE | Freq: Three times a day (TID) | ORAL | 0 refills | Status: DC

## 2016-04-28 MED ORDER — METFORMIN HCL 1000 MG PO TABS: 1000.0000 mg | ORAL_TABLET | Freq: Two times a day (BID) | ORAL | 0 refills | Status: DC

## 2016-04-28 NOTE — Telephone Encounter (Signed)
Patient called to request medication refill for   metFORMIN (GLUCOPHAGE) 1000 MG tablet  atorvastatin (LIPITOR) 10 MG tablet  gabapentin (NEURONTIN) 300 MG capsule  insulin NPH-regular Human (NOVOLIN 70/30) (70-30) 100 UNIT/ML injection  Insulin Pen Needle 31G X 5 MM MISC    Please call prescription to our pharmacy [facility])  Thank you.

## 2016-04-28 NOTE — Telephone Encounter (Signed)
Refilled requested medications - patient must have office visit with new primary care provider for any further refills.

## 2016-05-23 ENCOUNTER — Ambulatory Visit: Payer: Self-pay | Attending: Internal Medicine

## 2016-05-31 ENCOUNTER — Ambulatory Visit: Payer: Self-pay | Admitting: Internal Medicine

## 2016-08-25 ENCOUNTER — Encounter: Payer: Self-pay | Admitting: Licensed Clinical Social Worker

## 2016-08-25 ENCOUNTER — Ambulatory Visit: Payer: Self-pay | Attending: Family Medicine | Admitting: Family Medicine

## 2016-08-25 VITALS — BP 138/84 | HR 91 | Temp 98.3°F | Resp 18 | Ht 65.0 in | Wt 262.0 lb

## 2016-08-25 DIAGNOSIS — E119 Type 2 diabetes mellitus without complications: Secondary | ICD-10-CM | POA: Insufficient documentation

## 2016-08-25 DIAGNOSIS — Z8709 Personal history of other diseases of the respiratory system: Secondary | ICD-10-CM

## 2016-08-25 DIAGNOSIS — J45909 Unspecified asthma, uncomplicated: Secondary | ICD-10-CM | POA: Insufficient documentation

## 2016-08-25 DIAGNOSIS — Z794 Long term (current) use of insulin: Secondary | ICD-10-CM | POA: Insufficient documentation

## 2016-08-25 DIAGNOSIS — Z9114 Patient's other noncompliance with medication regimen: Secondary | ICD-10-CM | POA: Insufficient documentation

## 2016-08-25 DIAGNOSIS — L600 Ingrowing nail: Secondary | ICD-10-CM | POA: Insufficient documentation

## 2016-08-25 LAB — POCT GLYCOSYLATED HEMOGLOBIN (HGB A1C): HEMOGLOBIN A1C: 10

## 2016-08-25 LAB — GLUCOSE, POCT (MANUAL RESULT ENTRY): POC Glucose: 213 mg/dl — AB (ref 70–99)

## 2016-08-25 MED ORDER — TRUEPLUS LANCETS 28G MISC: 12 refills | Status: DC

## 2016-08-25 MED ORDER — METFORMIN HCL 1000 MG PO TABS: 1000.0000 mg | ORAL_TABLET | Freq: Two times a day (BID) | ORAL | 3 refills | Status: DC

## 2016-08-25 MED ORDER — GLUCOSE BLOOD VI STRP: ORAL_STRIP | 12 refills | Status: DC

## 2016-08-25 MED ORDER — INSULIN SYRINGES (DISPOSABLE) U-100 1 ML MISC: 1.0000 | Freq: Once | 11 refills | Status: AC

## 2016-08-25 MED ORDER — ALBUTEROL SULFATE HFA 108 (90 BASE) MCG/ACT IN AERS: 2.0000 | INHALATION_SPRAY | Freq: Four times a day (QID) | RESPIRATORY_TRACT | 2 refills | Status: DC | PRN

## 2016-08-25 MED ORDER — INSULIN NPH ISOPHANE & REGULAR (70-30) 100 UNIT/ML ~~LOC~~ SUSP: 37.0000 [IU] | Freq: Two times a day (BID) | SUBCUTANEOUS | 11 refills | Status: DC

## 2016-08-25 NOTE — Progress Notes (Signed)
Patient is her for DM  Patient has eaten today  Patient has not taking any meds today  Patient complain about leg pain  Patient needs refill  on all medication

## 2016-08-25 NOTE — Patient Instructions (Signed)
Come back in 2 weeks for diabetes follow up with clinical pharmacist. Apply for orange card to complete referral process Diabetes mellitus tipo2 en los adultos, cuidados personales (Type 2 Diabetes Mellitus, Self Care, Adult) Las personas que tienen diabetes tipo2 (diabetes mellitus tipo2) deben Contractor nivel de glucosa en la sangre bajo control. Es posible hacerlo a travs de lo siguiente:  Nutricin.  Actividad fsica.  Cambios en el estilo de vida.  Medicamentos o insulina, si es necesario.  El 50 de los mdicos y de Producer, television/film/video. CMO ME CONTROLO EL NIVEL DE GLUCOSA EN LA SANGRE?  Contrlese la glucemia todos los [place], con la frecuencia que le hayan indicado.  Llame al mdico si el nivel de glucosa en la sangre est por encima de las cifras ideales en 2anlisis seguidos.  Contrlese el nivel de hemoglobina A1c al [employer] al ao. Realcese controles ms frecuentes si el mdico se lo indica. El mdico fijar los objetivos del tratamiento para usted. Generalmente, los resultados de los niveles de glucosa en la sangre deben ser los siguientes:  Antes de las comidas (preprandial): de 80 a 120m/dl (4,4 a 7,231ml/l).  Despus de las comidas (posprandial): menos de 18044ml (62m39ml).  Nivel de A1c: menos del 7%. QU DEBO SABER ACERCA DE LA GLUCEMIA ALTA? El nivel alto de glucosa en la sangre se denomina hiperglucemia. Conozca los signos de la hiperglucemia. Los signos pueden incluir lo siguiente:  Sentir que tiene lo siguiente:  Sed.  Apetito.  Mucho cansancio.  Necesidad de orinGarment/textile technologist mayor frecuencia que lo habitual.  Visin borrosa. QU DEBO SABER ACERCA DE LA GLUCEMIA BAJA? El nivel bajo de glucosa en la sangre se denomina hipoglucemia. Este cuadro ocurre cuando el nivel de glucosa en la sangre es igual o menor que 70mg51m(3,9mmol11m. Entre los sntomas se pueden incluir los siguientes:  Sentir que tiene lo  siguiente:  Apetito.  Preocupacin o nervios (ansioso).  Sudoracin y piel hmeda.  Confusin.  Mareos.  Somnolencia.  Nuseas.  Tener lo siguiente:  Latidos cardacos rpidos (palpitaciones).  Dolor de cabezaNetherlandsbios en la visin.  Una crisis de movimientos que no puede controlar (convulsiones).  Pesadillas.  Hormigueo y falta de sensibilidad (adormecimiento) alrededor de la boca, los labios o la lenguaDundarrachicultades para hacer lo siguiente:  Hablar.  Prestar atencin (concentrarse).  Moverse (coordinacin).  Dormir.  Temblores.  Desmayos.  Molestarse con facilidad (irritabilidad). Tratamiento de la glucemia baja  Para tratar la glucemia baja, ingiera un alimento o una bebida azucarada de inmediato. Si puede pensar con claridad y tragar de manera segura, siga la regla 15/15, que consiste en lo siguiente:  Consumir 15gramos de un hidrato de carbono de accin rpida. Algunos hidratos de carbono de accin rpida son los siguientes:  1pomo de glucosa en gel.  3comprimidos de azcar (comprimidos de glucosa).  6 a 8unidades de caramelos duros.  4onzas (120ml) 29mugo de frutas.  4onzas (120ml) d5mseosa comn (no diettica).  Contrlese la glucemia 15minuto3mspus de ingerir el hidrato de carbono.  Si el nivel de glucosa en la sangre todava es igual o menor que 70mg/dl (58mmol/l), 95miera nuevamente 15gramos de un hidrato de carbono.  Si el nivel de glucosa en la sangre no supera los 70mg/dl (3,92ml/l) des79m de 3intentos, solicite ayuda de inmediato.  Ingiera una comida o una colacin en el transcurso de 1hora despus de que la glucemia se haya normalizado. Tratamiento de la glucemia muy baja  Si el nivel de  glucosa en la sangre es igual o menor que 18m/dl (324ml/l), significa que est muy bajo (hipoglucemia grave). Esto es unEngineer, maintenance (IT)No espere hasta que los sntomas desaparezcan. Solicite atencin mdica de inmediato.  Comunquese con el servicio de emergencias de su localidad (911 en los [place]). No conduzca por sus propios medios haPrincipal FinancialSi su nivel de glucosa en la sangre muy bajo y no puede ingerir ningn alimento ni bebida, tal vez deba aplicarse una inyeccin de glucagn. Un familiar o un amigo deben aprender a controlarle la glucemia y a aplicarle una inyeccin de glucagn. Pregntele al mdico si debe tener un kit de inyecciones de glucagn en su casa. QU OTRAS COSAS SON IMPORTANTES PARA CONTROLAR LA DIABETES? Medicamentos  Siga estas indicaciones respecto de la insulina y los medicamentos para la diabetes:  Tmelos como se lo haya indicado el mdico.  Ajstelos como se lo haya indicado el mdico.  No se quede sin medicamentos. La diabetes puede aumentar el riesgo de tener otras enfermedades a laBarrister's clerkEstas incluyen las cardiopatas coronarias o enfermedades renales. El mdico puede recetar medicamentos para evitar los problemas causados por la diabetes. Alimento   Opte por opciones de alimentos saludables. Estos incluyen los siguientes:  Pollo, pescado, claras de huevos y frijoles.  Avena, salvado, trigo burgol, arroz integral, quinua y mijo.  FrLambert Mody verduras frescas.  Productos lcteos descremados.  Frutos secos, aguacate, aceite de olSchooner Bay aceite de canola.  Arme un plan de alimentacin con un especialista (nutricionista).  Siga las indicaciones del mdico respecto de lo que no puede comer o beber.  Beba suficiente lquido para mantener el pis (orina) claro o de color amarillo plido.  Ingiera colaciones saludables entre comidas saludables.  Lleve un registro de los hidratos de carbono que consume. Lea las etiquetas de los alimentos. Conozca el tamao de las porciones de los alimentos.  Siga el plan para los das de enfermedad cuando no pueda comer o beber normalmente. Arme este plan con el mdico, de modo que est listo para usarlo. Actividad    Practique ejercicios al menos 3veces por semana.  No deje pasar ms de 2das sin hacer actividad fsica.  Hable con el mdico antes de comenzar un ejercicio nuevo. Es posible que el mdico deba hacer ajustes en la inWhitewrightlos medicamentos o los alimentos. Estilo de vida   No use productos que contengan tabaco. Estos incluyen cigarrillos, tabaco de maHigher education careers adviser cigarrillos electrnicos. Si necesita ayuda para dejar de fumar, consulte al mdico.  Pregntele a su mdico qu cantidad de alcohol puede tomar.  Aprenda a sobrellevar el estrs. Si necesita ayuda para lograrlo, consulte al mdMeadWestvacoCuidado del cuerpo   Mantngase al da con las vacunas.  Hgase controlar los ojos y los pies por un mdico con la frecuencia que le hayan indicado.  Revsese la piel y loConstellation BrandsExamine si hay cortes, moretones, enrojecimiento, ampollas o llagas.  Cepllese los dientes y laEast Cathlamet Use el hilo dental al menos una vez al da.  Vaya al dentista al menos una vez cada 10m5ms.  Mantenga un peso saludable. Instrucciones generales   TomDelphi venta libre y los recetados solamente como se lo haya indicado el mdico.  Comparta su plan de atencin de la diabetes con estas personas:  Compaeros de trabajo o de la escuela.  Aquellas con las que conSurpriseHgase anlisis de orina para detProduct manageresencia de cetonas:  Cuando est enfermo.  Como se lo haya indicado el mdico.  Lleve consigo una tarjeta, o use un brazalete o una medalla que indiquen que es diabtico.  Pregntele al mdico lo siguiente:  Debo reunirme con Radio broadcast assistant para el cuidado de la diabetes?  Dnde puedo encontrar un grupo de apoyo para personas diabticas?  Concurra a todas las visitas de control como se lo haya indicado el mdico. Esto es importante. DNDE ENCONTRAR MS INFORMACIN: Para obtener ms informacin sobre la diabetes, visite los siguientes  sitios:  Asociacin Americana de la Diabetes (American Diabetes Association): www.diabetes.org  Asociacin Norteamericana de Instructores para el Cuidado de la Diabetes (American Association of Diabetes Educators): www.diabeteseducator.org/patient-resources Esta informacin no tiene Marine scientist el consejo del mdico. Asegrese de hacerle al mdico cualquier pregunta que tenga. Document Released: 10/12/2015 Document Revised: 10/12/2015 Document Reviewed: 07/24/2015 Elsevier Interactive Patient Education  2017 [employer].
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
## 2016-08-26 LAB — BASIC METABOLIC PANEL WITH GFR
BUN: 11 mg/dL (ref 7–25)
CALCIUM: 8.7 mg/dL (ref 8.6–10.2)
CO2: 24 mmol/L (ref 20–31)
CREATININE: 0.69 mg/dL (ref 0.50–1.10)
Chloride: 102 mmol/L (ref 98–110)
GFR, Est Non African American: >89 mL/min (ref >=60)
Glucose, Bld: 153 mg/dL — ABNORMAL HIGH (ref 65–99)
Potassium: 4.3 mmol/L (ref 3.5–5.3)
Sodium: 136 mmol/L (ref 135–146)

## 2016-08-26 LAB — LIPID PANEL
Cholesterol: 180 mg/dL (ref <200)
HDL: 38 mg/dL — ABNORMAL LOW (ref >50)
LDL CALC: 104 mg/dL — AB (ref <100)
Total CHOL/HDL Ratio: 4.7 Ratio (ref <5.0)
Triglycerides: 192 mg/dL — ABNORMAL HIGH (ref <150)
VLDL: 38 mg/dL — AB (ref <30)

## 2016-08-26 LAB — MICROALBUMIN / CREATININE URINE RATIO
CREATININE, URINE: 208 mg/dL (ref 20–320)
MICROALB UR: 2.9 mg/dL
MICROALB/CREAT RATIO: 14 ug/mg{creat} (ref <30)

## 2016-09-01 ENCOUNTER — Telehealth: Payer: Self-pay

## 2016-09-01 ENCOUNTER — Other Ambulatory Visit: Payer: Self-pay | Admitting: Family Medicine

## 2016-09-01 DIAGNOSIS — E782 Mixed hyperlipidemia: Secondary | ICD-10-CM

## 2016-09-01 DIAGNOSIS — Z794 Long term (current) use of insulin: Principal | ICD-10-CM

## 2016-09-01 DIAGNOSIS — E119 Type 2 diabetes mellitus without complications: Secondary | ICD-10-CM

## 2016-09-01 MED ORDER — ATORVASTATIN CALCIUM 20 MG PO TABS: 20.0000 mg | ORAL_TABLET | Freq: Every day | ORAL | 2 refills | Status: DC

## 2016-09-01 NOTE — Telephone Encounter (Signed)
-----   Message from Alfonse Spruce, [place] sent at 09/01/2016  3:39 AM EST ----- Microalbumin/creatinine ratio level was normal. This tests for protein in your urine that can indicate early signs of kidney damage.  Lipid levels were elevated. This can increase your risk of heart disease. Your dose of atorvastatin has been increased Kidney function normal HgbA1c is  10. HgbA1c gives an average of your blood sugar levels over the last 3 months. Goal is to lower your HgbA1c levels to 7 or below. Continue to take your diabetic medications as prescribed. Recheck levels in 3 month.

## 2016-09-01 NOTE — Telephone Encounter (Signed)
CMA call to go over lab results  Patient did not answer CMA left a VM stating the reason of the call & to call us back

## 2016-09-05 ENCOUNTER — Ambulatory Visit: Payer: Self-pay | Admitting: Family Medicine

## 2016-09-05 ENCOUNTER — Telehealth: Payer: Self-pay | Admitting: Family Medicine

## 2016-09-05 NOTE — Telephone Encounter (Signed)
CMA call patient to inform lab results  Patient was aware and understood   

## 2016-09-05 NOTE — Telephone Encounter (Signed)
Patient returned nurse's call regarding her results. Please follow up.  Thank you.

## 2016-09-08 ENCOUNTER — Ambulatory Visit: Payer: Self-pay | Attending: Family Medicine | Admitting: Pharmacist

## 2016-09-08 ENCOUNTER — Ambulatory Visit (HOSPITAL_BASED_OUTPATIENT_CLINIC_OR_DEPARTMENT_OTHER): Payer: Self-pay | Admitting: Family Medicine

## 2016-09-08 ENCOUNTER — Ambulatory Visit (HOSPITAL_COMMUNITY)
Admission: RE | Admit: 2016-09-08 | Discharge: 2016-09-08 | Disposition: A | Discharge status: Home / Self Care | Payer: Self-pay | Source: Ambulatory Visit | Attending: Family Medicine | Admitting: Family Medicine

## 2016-09-08 VITALS — BP 129/81 | HR 83 | Temp 98.2°F | Resp 18 | Ht 65.0 in | Wt 268.8 lb

## 2016-09-08 DIAGNOSIS — Z0001 Encounter for general adult medical examination with abnormal findings: Secondary | ICD-10-CM | POA: Insufficient documentation

## 2016-09-08 DIAGNOSIS — Z794 Long term (current) use of insulin: Secondary | ICD-10-CM

## 2016-09-08 DIAGNOSIS — S335XXA Sprain of ligaments of lumbar spine, initial encounter: Secondary | ICD-10-CM

## 2016-09-08 DIAGNOSIS — S339XXA Sprain of unspecified parts of lumbar spine and pelvis, initial encounter: Secondary | ICD-10-CM

## 2016-09-08 DIAGNOSIS — X58XXXA Exposure to other specified factors, initial encounter: Secondary | ICD-10-CM | POA: Insufficient documentation

## 2016-09-08 DIAGNOSIS — S3992XA Unspecified injury of lower back, initial encounter: Secondary | ICD-10-CM

## 2016-09-08 DIAGNOSIS — M545 Low back pain: Secondary | ICD-10-CM | POA: Insufficient documentation

## 2016-09-08 DIAGNOSIS — M5136 Other intervertebral disc degeneration, lumbar region: Secondary | ICD-10-CM | POA: Insufficient documentation

## 2016-09-08 DIAGNOSIS — Z79899 Other long term (current) drug therapy: Secondary | ICD-10-CM | POA: Insufficient documentation

## 2016-09-08 DIAGNOSIS — E119 Type 2 diabetes mellitus without complications: Secondary | ICD-10-CM | POA: Insufficient documentation

## 2016-09-08 LAB — GLUCOSE, POCT (MANUAL RESULT ENTRY): POC Glucose: 190 mg/dl — AB (ref 70–99)
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
## 2016-09-08 MED ORDER — METHOCARBAMOL 750 MG PO TABS: 750.0000 mg | ORAL_TABLET | Freq: Three times a day (TID) | ORAL | 0 refills | Status: DC | PRN

## 2016-09-08 MED ORDER — NAPROXEN 500 MG PO TABS: 500.0000 mg | ORAL_TABLET | Freq: Two times a day (BID) | ORAL | 0 refills | Status: DC

## 2016-09-08 NOTE — Patient Instructions (Signed)
Thanks for coming to see me!  Try to pick up the strips from the pharmacy  If you cannot get them here, you can get strips and a meter for around $10 at [place] back 2 weeks after you start checking your blood sugar.   Control del nivel sanguneo de glucosa en los adultos (Blood Glucose Monitoring, Adult) El control del nivel de azcar (glucosa) en la sangre lo ayuda a tener la diabetes bajo control. Tambin ayuda a que usted y el mdico controlen si el tratamiento de la diabetes es Armed forces logistics/support/administrative officer. El control del nivel sanguneo de glucosa implica realizar controles regulares como lo indique el mdico y Catering manager registro de los resultados (registro diario). POR QU DEBO [place] DE GLUCOSA? Si controla su nivel sanguneo de glucosa con regularidad, podr:  Comprender de [employer], la actividad fsica, las enfermedades y los medicamentos inciden en los niveles sanguneos de [place].  Conocer el nivel sanguneo de glucosa en cualquier momento dado. Saber rpidamente si el nivel es bajo (hipoglucemia) o alto (hiperglucemia).  Puede ser de ayuda para que usted y el mdico sepan cmo ajustar los medicamentos. [place] DE GLUCOSA? Siga las indicaciones del mdico acerca de la frecuencia con la que debe controlar el nivel sanguneo de glucosa. La frecuencia puede depender de:  El tipo de diabetes que tenga.  Si su diabetes est bajo control.  Los medicamentos que toma. Si usted tiene diabetes tipo 1:  Controle su nivel sanguneo de glucosa al [employer] al da.  Tambin controle su nivel sanguneo de glucosa:  Antes de cada inyeccin de insulina.  Antes y despus de hacer ejercicio.  [place] comidas.  Dos horas despus de una comida.  Ocasionalmente, entre las 2:00a.m. y las 3:00a.m., como se lo hayan indicado.  Antes de Optometrist tareas peligrosas, English as a second language teacher o usar maquinaria pesada.  A la hora de  acostarse.  Es posible que Geophysicist/field seismologist con ms frecuencia los niveles sanguneos de [place], [place] 6 a 10veces por da:  Si [place] una bomba de [place].  Si necesita varias inyecciones diarias.  Si su diabetes no est bien controlada.  Si est enfermo.  Si tiene antecedentes de hipoglucemia grave.  Si tiene antecedentes de no darse cuenta cundo est bajando su nivel sanguneo de glucosa (hipoglucemia asintomtica). Si usted tiene diabetes tipo 2:  Si recibe insulina u otro medicamento para la diabetes, controle el nivel sanguneo de glucosa al [employer] veces al da.  Mientras reciba tratamiento intensivo con insulina, debe medirse el nivel sanguneo de glucosa al menos 4veces al [employer]. Ocasionalmente, es posible que deba controlarse entre las 2:00a.m. y las 3:00a.m., segn se lo indiquen.  Tambin controle su nivel sanguneo de glucosa:  Antes y despus de hacer ejercicio.  Antes de Optometrist tareas peligrosas, English as a second language teacher o usar maquinaria pesada.  Es posible que Geophysicist/field seismologist con ms frecuencia los niveles sanguneos de glucosa si:  Es necesario ajustar la dosis de sus medicamentos.  Su diabetes no est bien controlada.  Est enfermo. QU ES UN REGISTRO [place] DE GLUCOSA?  Un registro diario es un registro de los valores de glucosa en la [place]. Puede ayudarles a usted y a su mdico a:  Clinical biochemist su nivel sanguneo de glucosa durante el transcurso del [place].  Ajustar su plan de control de la diabetes como sea necesario.  Cada vez que controle su nivel sanguneo de glucosa, anote  el resultado y [employer] factores que pueden estar afectando su nivel sanguneo de glucosa, como la dieta y la actividad fsica Designer, multimedia.  La [employer] de los medidores de glucosa guardan un registro de las lecturas realizadas con el medidor. Algunos permiten descargar sus registros en una computadora. [place] DE  GLUCOSA? Siga los siguientes pasos para obtener lecturas precisas de su glucemia: Materiales necesarios  Medidor de Printmaker.  Tiras reactivas para el medidor. Cada medidor tiene sus propias tiras reactivas. Debe usar las tiras reactivas que trae su medidor.  Una aguja para pincharse el dedo (lanceta). No utilice la misma lanceta en ms de una ocasin.  Un dispositivo que sujeta la lanceta (dispositivo de puncin).  Un diario o libro de anotaciones para [employer]. Procedimiento  [employer] con agua y [place].  Pnchese el costado del dedo (no la punta) con Retail buyer. Use un dedo diferente cada vez.  Frote suavemente el dedo [employer] aparezca una pequea gota de [place].  Siga las instrucciones que vienen con el medidor para Garment/textile technologist tira Comptroller, Midwife la sangre sobre la tira y usar el medidor de Printmaker.  Registre el resultado y las observaciones que desee. Zonas del cuerpo alternativas para realizar las pruebas  Algunos medidores le permiten tomar sangre para la prueba de otras zonas del cuerpo que no son el dedo (zonas alternativas).  Si cree que tiene hipoglucemia o si tiene hipoglucemia asintomtica, no utilice las zonas alternativas del cuerpo. En su lugar, use los dedos.  Es posible que las zonas alternativas no sean tan precisas como los dedos porque el flujo de sangre es ms lento en esas zonas. Esto significa que el resultado que obtiene de estas zonas puede estar retrasado y ser un poco diferente del resultado que obtendra del dedo.  Los sitios alternativos ms comunes son los siguientes:  Los antebrazos.  Los muslos.  La palma de la mano. Consejos adicionales  Siempre tenga los insumos a mano.  Todos los medidores de glucosa incluyen un nmero de telfono "directo", disponible las 24 horas, al que podr llamar si tiene preguntas o [place]. Tambin puede consultar a su mdico.  Despus de usar algunas  cajas de tiras reactivas, ajuste (calibre) el medidor de glucemia segn las instrucciones del medidor. Esta informacin no tiene Marine scientist el consejo del mdico. Asegrese de hacerle al mdico cualquier pregunta que tenga. Document Released: 06/20/2005 Document Revised: 10/12/2015 Document Reviewed: 11/30/2015 Elsevier Interactive Patient Education  2017 [employer].

## 2016-09-08 NOTE — Progress Notes (Signed)
Patient is here for back pain complain about two weeks ago  Patient is taking aleeve for the pain

## 2016-09-08 NOTE — Progress Notes (Signed)
Subjective:  Patient ID: Julie Robertson, female    DOB: [birth year]  Age: 41 y.o. MRN: [id number]  CC: No chief complaint on file.   HPI Julie Robertson presents for   Back pain: 2 weeks. History of heavy lifting 2 weeks ago grinding stone for corn in low kitchen cabinet. Reports taking Advil and Aleve with minimal relief of symptoms.    DM: Reports adherence with current diabetic medications.   Outpatient Medications Prior to Visit  Medication Sig Dispense Refill  . albuterol (PROVENTIL HFA;VENTOLIN HFA) 108 (90 Base) MCG/ACT inhaler Inhale 2 puffs into the lungs every 6 (six) hours as needed for wheezing or shortness of breath. 1 Inhaler 2  . atorvastatin (LIPITOR) 20 MG tablet Take 1 tablet (20 mg total) by mouth daily. 30 tablet 2  . Blood Glucose Monitoring Suppl (TRUE METRIX METER) W/DEVICE KIT Use as directed 1 kit 0  . gabapentin (NEURONTIN) 300 MG capsule Take 1 capsule (300 mg total) by mouth 3 (three) times daily. 90 capsule 0  . glucose blood (TRUE METRIX BLOOD GLUCOSE TEST) test strip Use as instructed 100 each 12  . ibuprofen (ADVIL,MOTRIN) 200 MG tablet Take 400 mg by mouth every 6 (six) hours as needed for moderate pain.    Marland Kitchen insulin NPH-regular Human (NOVOLIN 70/30) (70-30) 100 UNIT/ML injection Inject 37 Units into the skin 2 (two) times daily with a meal. 10 mL 11  . Insulin Pen Needle 31G X 5 MM MISC Inject 10 units subcutaneous at bedtime 100 each 0  . metFORMIN (GLUCOPHAGE) 1000 MG tablet Take 1 tablet (1,000 mg total) by mouth 2 (two) times daily with a meal. 60 tablet 3  . TRUEPLUS LANCETS 28G MISC Use as directed 100 each 12   No facility-administered medications prior to visit.     ROS Review of Systems  Respiratory: Negative.   Cardiovascular: Negative.   Musculoskeletal: Positive for back pain.  Neurological: Negative.     Objective:  BP 129/81 (BP Location: Left Arm, Patient Position: Sitting, Cuff Size: Normal)   Pulse 83    Temp 98.2 F (36.8 C) (Oral)   Resp 18   Ht 5' 5"  (1.651 m)   Wt 268 lb 12.8 oz (121.9 kg)   SpO2 97%   BMI 44.73 kg/m   BP/Weight 09/08/2016 08/25/2016 7/82/9562  Systolic BP 130 865 784  Diastolic BP 81 84 75  Wt. (Lbs) 268.8 262 255  BMI 44.73 43.6 42.43   Physical Exam  Cardiovascular: Normal rate, regular rhythm, normal heart sounds and intact distal pulses.   Pulmonary/Chest: Effort normal and breath sounds normal.  Abdominal: Soft. Bowel sounds are normal.  Musculoskeletal:       Lumbar back: She exhibits tenderness and pain (flexion/extension).  Neurological: She has normal reflexes.  Nursing note and vitals reviewed.  Assessment & Plan:   Problem List Items Addressed This Visit      Endocrine   DM (diabetes mellitus) ([place])   Relevant Orders   Glucose (CBG) (Completed)    Other Visit Diagnoses    Sprain of low back, initial encounter    -  Primary   Relevant Medications   naproxen (NAPROSYN) 500 MG tablet   methocarbamol (ROBAXIN) 750 MG tablet   Other Relevant Orders   DG Lumbar Spine Complete (Completed)   Lower back injury, initial encounter       Relevant Orders   DG Lumbar Spine Complete (Completed)      Meds ordered this encounter  Medications  . naproxen (NAPROSYN) 500 MG tablet    Sig: Take 1 tablet (500 mg total) by mouth 2 (two) times daily with a meal.    Dispense:  40 tablet    Refill:  0    Order Specific Question:   Supervising Provider    Answer:   Tresa Garter [id number]  . methocarbamol (ROBAXIN) 750 MG tablet    Sig: Take 1 tablet (750 mg total) by mouth every 8 (eight) hours as needed for muscle spasms.    Dispense:  30 tablet    Refill:  0    Order Specific Question:   Supervising Provider    Answer:   Tresa Garter [id number]    Follow-up: Return if symptoms worsen or fail to improve.   Alfonse Spruce FNP

## 2016-09-08 NOTE — Patient Instructions (Signed)
Distensin lumbar con rehabilitacin (Low Back Strain With Rehab) Una distensin es un estiramiento o un desgarro en un msculo o los fuertes cordones de tejido que adhieren el msculo al hueso (tendones). Las distensiones en la parte inferior de la espalda (columna lumbar) son una causa frecuente de dolor lumbar. Una distensin ocurre cuando los msculos o tendones se desgarran o se estiran ms all de su lmite. Los msculos se pueden inflamar y, por lo tanto, provocar dolor y contraccin repentina del msculo (espasmos). Una distensin puede ocurrir de repente debido a una lesin (traumatismo) o se puede desarrollar gradualmente debido al uso excesivo. Hay tres tipos de distensiones:  El grado1 es una distensin leve que se caracteriza por un desgarro menor de las fibras o tendones musculares. Esto puede provocar un poco de dolor, pero no hay prdida de fuerza muscular.  El grado2 es una distensin moderada producida por un desgarro parcial de las fibras o tendones musculares. Esto provoca un dolor ms intenso y cierta prdida de fuerza muscular.  El grado3 es una distensin grave e implica la ruptura completa del msculo o del tendn. Esto causa un dolor intenso y la prdida completa o casi completa de la fuerza muscular. CAUSAS Esta afeccin puede ser causada por lo siguiente:  Traumatismo, debido, por ejemplo, a una cada o a un golpe en el cuerpo.  Torsin o hiperdistensin de la espalda. Esto puede ser el resultado de realizar actividades que requieren mucha energa, como levantar objetos pesados. FACTORES DE RIESGO Los siguientes factores pueden aumentar el riesgo de sufrir esta afeccin:  Practicar deportes de contacto.  Participar en deportes o actividades que sobrecargan la espalda e implican mucha flexin y torsin, por ejemplo: ? Levantar pesas u objetos pesados. ? Gimnasia. ? Ftbol. ? Patinaje artstico. ? Snowboard.  Tener exceso de peso u obesidad.  Tener poca  fuerza y flexibilidad. SNTOMAS Los sntomas de esta afeccin pueden incluir los siguientes:  Dolor agudo o sordo en la parte inferior de la espalda que no desaparece. El dolor se puede extender hacia las nalgas.  Rigidez.  Amplitud de movimientos limitada.  Incapacidad para pararse derecho debido a la rigidez o al dolor.  Espasmos musculares. DIAGNSTICO Esta afeccin se puede diagnosticar en funcin de lo siguiente:  Sus sntomas.  Sus antecedentes mdicos.  Un examen fsico. ? El mdico puede presionar sobre ciertas zonas de la espalda para determinar el origen de su dolor. ? Le puede pedir que se incline hacia adelante, hacia atrs y de un lado al otro para evaluar la intensidad del dolor y la amplitud de movimiento.  Pruebas de diagnstico por imgenes, como: ? Radiografas. ? Resonancia magntica (RM). TRATAMIENTO El tratamiento de esta afeccin puede incluir lo siguiente:  Aplicar calor y fro en la zona afectada.  Tomar medicamentos para aliviar el dolor y relajar los msculos (relajantes musculares).  Tomar antiinflamatorios no esteroides (AINE) para ayudar a reducir la hinchazn y las molestias.  Realizar fisioterapia. Cuando los sntomas mejoran, es importante volver a su rutina habitual tan pronto como sea posible para reducir el dolor, y evitar la rigidez y la prdida de fuerza muscular. En general, los sntomas deberan mejorar en 6semanas de tratamiento. Sin embargo, el tiempo de recuperacin vara. INSTRUCCIONES PARA EL CUIDADO EN EL HOGAR Control del dolor, de la rigidez y de la hinchazn  Si se lo indic el mdico, aplique hielo en la zona afectada durante las primeras 24horas despus de la lesin. ? Ponga el hielo en una bolsa plstica. ?   Coloque una toalla entre la piel y la bolsa de hielo. ? Coloque el hielo durante 20minutos, 2 a 3veces por da.  Si se lo indican, aplique calor en la zona afectada tan frecuentemente como se lo haya indicado el  mdico. Use la fuente de calor que el mdico le recomiende, como una compresa de calor hmedo o una almohadilla trmica. ? Coloque una toalla entre la piel y la fuente de calor. ? Aplique el calor durante 20 a 30minutos. ? Retire la fuente de calor si la piel se le pone de color rojo brillante. Esto es muy importante si no puede sentir el dolor, el calor o el fro. Puede correr un riesgo mayor de sufrir quemaduras. Actividad  Descanse y retome sus actividades normales como se lo haya indicado el mdico. Pregntele al mdico qu actividades son seguras para usted.  Evite las actividades que demandan mucho esfuerzo (que son extenuantes) durante el tiempo que le haya indicado el mdico.  Haga ejercicios como se lo haya indicado el mdico. Instrucciones generales  Tome los medicamentos de venta libre y los recetados solamente como se lo haya indicado el mdico.  Si tiene alguna pregunta o inquietud sobre la seguridad mientras toma analgsicos, hable con el mdico.  No conduzca ni opere maquinaria pesada hasta saber cmo lo afectan los analgsicos.  No consuma ningn producto que contenga tabaco, lo que incluye cigarrillos, tabaco de mascar y cigarrillos electrnicos. El tabaco puede retrasar el proceso de curacin. Si necesita ayuda para dejar de fumar, consulte al mdico.  Concurra a todas las visitas de control como se lo haya indicado el mdico. Esto es importante. PREVENCIN  Precaliente y elongue adecuadamente antes de la actividad.  Reljese y elongue despus de realizar una actividad.  Dele a su cuerpo tiempo para descansar entre los perodos de actividad.  Evite lo siguiente: ? Estar fsicamente inactivo durante perodos prolongados. ? Hacer ejercicio o practicar deportes cuando est cansado o dolorido.  Practique deportes y levante objetos pesados de la forma correcta.  Adopte una buena postura mientras est sentado y de pie.  Mantenga un peso saludable.  Duerma en un  colchn de firmeza media para apoyar la columna.  Asegrese de utilizar un equipo apto para usted, incluidos zapatos que calcen bien.  Tome medidas de seguridad y sea responsable al hacer una actividad, para evitar las cadas.  Haga por lo menos 150minutos de ejercicios de intensidad moderada cada semana, como caminar a paso ligero o hacer gimnasia acutica. Pruebe alguna forma de ejercicio que le quite tensin de la espalda, como nadar o utilizar la bicicleta fija.  Mantenga un buen estado fsico, esto incluye lo siguiente: ? La fuerza. ? La flexibilidad. ? La capacidad cardiovascular. ? La resistencia.  SOLICITE ATENCIN MDICA SI:  El dolor de espalda no mejora despus de 6semanas de tratamiento.  Los sntomas empeoran.  SOLICITE ATENCIN MDICA DE INMEDIATO SI:  El dolor de espalda es muy intenso.  Nota que no puede pararse o caminar.  Siente dolor en las piernas.  Siente debilidad en las nalgas o en las piernas.  Tiene problemas para controlar la miccin o los movimientos intestinales.  Esta informacin no tiene como fin reemplazar el consejo del mdico. Asegrese de hacerle al mdico cualquier pregunta que tenga. Document Released: 04/06/2006 Document Revised: 11/04/2014 Document Reviewed: 04/01/2015 Elsevier Interactive Patient Education  2017 Elsevier Inc.  

## 2016-09-08 NOTE — Progress Notes (Signed)
    S:     Chief Complaint  Patient presents with  . Medication Management    Patient arrives in good spirits.  Presents for diabetes evaluation, education, and management at the request of Fredia Beets NP/Dr. Doreene Burke. Patient was referred on 08/25/16.  Patient was last seen by Primary Care Provider on 08/25/16 . Interpreter Raquel Sarna was used for the entirety of the visit.   Patient reports adherence with medications.  Current diabetes medications include: Novolin 70/30 37 units twice daily and metformin 1000 mg BID.  Patient reports that she is not able to check her blood sugars at home because she is unable to afford the strips. She is unable to work because she has to care for her son.  O:  Physical Exam   ROS   Lab Results  Component Value Date   HGBA1C 10.0 08/25/2016   There were no vitals filed for this visit.  Home fasting CBG: none 2 hour post-prandial/random CBG: none   A/P: Diabetes longstanding currently uncontrolled based on A1c of 10. Patient denies hypoglycemic events and is able to verbalize appropriate hypoglycemia management plan. Patient reports adherence with medication. Control is suboptimal due to dietary indiscretion and sedentary lifestyle.  Stressed the importance of taking care of herself in order to care for her son and the importance of getting the blood glucose strips. If she cannot afford the strips here, she can get a meter and strips at [facility] for $10. Also encouragedd her to follow up with financial assistance here as I believe her pharmacy benefits have expired which might be why her medications as so expensive. Patient verbalized understanding. Once I have blood glucose readings from home, I will be able to adjust her insulin.   Next A1C anticipated may 2018.    Written patient instructions provided.  Total time in face to face counseling 20 minutes.   Follow up in Pharmacist Clinic Visit in 2 weeks.   Patient seen with Maryan Char, PharmD  Candidate

## 2016-09-12 ENCOUNTER — Other Ambulatory Visit: Payer: Self-pay | Admitting: Family Medicine

## 2016-09-13 ENCOUNTER — Other Ambulatory Visit: Payer: Self-pay | Admitting: Family Medicine

## 2016-09-13 DIAGNOSIS — M5134 Other intervertebral disc degeneration, thoracic region: Secondary | ICD-10-CM

## 2016-09-13 DIAGNOSIS — M5136 Other intervertebral disc degeneration, lumbar region: Secondary | ICD-10-CM

## 2016-09-14 ENCOUNTER — Telehealth: Payer: Self-pay

## 2016-09-14 NOTE — Telephone Encounter (Signed)
CMA call patient to inform x ray results  Patient verify DOB  Patient was aware and understood

## 2016-09-14 NOTE — Telephone Encounter (Signed)
-----   Message from Alfonse Spruce, [place] sent at 09/13/2016 10:09 PM EDT ----- Odette Horns shows degenerative disc disease of the lower spina column.You will be referred to orthopedics.

## 2016-09-14 NOTE — Telephone Encounter (Signed)
CMA call to inform patient about x ray results  Patient did not answer but left a VM stating the reason of the call & to call me back

## 2016-09-14 NOTE — Telephone Encounter (Signed)
Pt returned call to get results. Please call again.

## 2016-09-22 ENCOUNTER — Ambulatory Visit: Payer: Self-pay | Admitting: Pharmacist

## 2016-09-29 ENCOUNTER — Ambulatory Visit: Payer: Self-pay | Admitting: Pharmacist

## 2016-09-29 NOTE — Progress Notes (Deleted)
    S:     No chief complaint on file.   Patient arrives in good spirits.  Presents for diabetes evaluation, education, and management at the request of Fredia Beets NP/Dr. Doreene Burke. Patient was referred on 08/25/16.  Patient was last seen by Primary Care Provider on 08/25/16 . Interpreter Raquel Sarna was used for the entirety of the visit.   Patient reports adherence with medications.  Current diabetes medications include: Novolin 70/30 37 units twice daily and metformin 1000 mg BID.  Patient reports that she is not able to check her blood sugars at home because she is unable to afford the strips. She is unable to work because she has to care for her son.  O:  Physical Exam   ROS   Lab Results  Component Value Date   HGBA1C 10.0 08/25/2016   There were no vitals filed for this visit.  Home fasting CBG: none 2 hour post-prandial/random CBG: none   A/P: Diabetes longstanding currently uncontrolled based on A1c of 10. Patient denies hypoglycemic events and is able to verbalize appropriate hypoglycemia management plan. Patient reports adherence with medication. Control is suboptimal due to dietary indiscretion and sedentary lifestyle.  Stressed the importance of taking care of herself in order to care for her son and the importance of getting the blood glucose strips. If she cannot afford the strips here, she can get a meter and strips at [facility] for $10. Also encouragedd her to follow up with financial assistance here as I believe her pharmacy benefits have expired which might be why her medications as so expensive. Patient verbalized understanding. Once I have blood glucose readings from home, I will be able to adjust her insulin.   Next A1C anticipated may 2018.    Written patient instructions provided.  Total time in face to face counseling 20 minutes.   Follow up in Pharmacist Clinic Visit in 2 weeks.   Patient seen with Maryan Char, PharmD Candidate

## 2016-10-12 ENCOUNTER — Other Ambulatory Visit: Payer: Self-pay | Admitting: Internal Medicine

## 2016-10-18 ENCOUNTER — Encounter (INDEPENDENT_AMBULATORY_CARE_PROVIDER_SITE_OTHER): Payer: Self-pay

## 2016-10-18 ENCOUNTER — Ambulatory Visit (INDEPENDENT_AMBULATORY_CARE_PROVIDER_SITE_OTHER): Payer: Self-pay | Admitting: Orthopaedic Surgery

## 2016-10-18 ENCOUNTER — Encounter (INDEPENDENT_AMBULATORY_CARE_PROVIDER_SITE_OTHER): Payer: Self-pay | Admitting: Orthopaedic Surgery

## 2016-10-18 VITALS — BP 133/89 | HR 86 | Ht 65.0 in | Wt 260.0 lb

## 2016-10-18 DIAGNOSIS — G8929 Other chronic pain: Secondary | ICD-10-CM

## 2016-10-18 DIAGNOSIS — M545 Low back pain, unspecified: Secondary | ICD-10-CM
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
## 2016-10-21 ENCOUNTER — Ambulatory Visit: Payer: Self-pay | Attending: Family Medicine

## 2016-10-31 ENCOUNTER — Other Ambulatory Visit: Payer: Self-pay | Admitting: *Deleted

## 2016-10-31 MED ORDER — INSULIN NPH ISOPHANE & REGULAR (70-30) 100 UNIT/ML ~~LOC~~ SUSP: 37.0000 [IU] | Freq: Two times a day (BID) | SUBCUTANEOUS | 3 refills | Status: DC

## 2016-10-31 NOTE — Telephone Encounter (Signed)
PRINTED FOR PASS PROGRAM 

## 2016-12-05 ENCOUNTER — Other Ambulatory Visit: Payer: Self-pay | Admitting: Family Medicine

## 2016-12-05 DIAGNOSIS — E119 Type 2 diabetes mellitus without complications: Secondary | ICD-10-CM

## 2016-12-05 DIAGNOSIS — E782 Mixed hyperlipidemia: Secondary | ICD-10-CM

## 2016-12-05 DIAGNOSIS — Z794 Long term (current) use of insulin: Principal | ICD-10-CM

## 2016-12-05 NOTE — Telephone Encounter (Signed)
Patient called requesting medication refill on  metFORMIN (GLUCOPHAGE) 1000 MG tablet, insulin and atorvastatin (LIPITOR) 20 MG tablet. Patient states she is completely out of refill, please f/up

## 2016-12-19 ENCOUNTER — Ambulatory Visit: Payer: Self-pay | Attending: Family Medicine

## 2017-01-13 ENCOUNTER — Ambulatory Visit: Payer: Self-pay | Attending: Family Medicine | Admitting: Family Medicine

## 2017-01-13 ENCOUNTER — Encounter: Payer: Self-pay | Admitting: Family Medicine

## 2017-01-13 ENCOUNTER — Other Ambulatory Visit (HOSPITAL_COMMUNITY)
Admission: RE | Admit: 2017-01-13 | Discharge: 2017-01-13 | Disposition: A | Discharge status: Home / Self Care | Payer: Medicaid Other | Source: Ambulatory Visit | Attending: Family Medicine | Admitting: Family Medicine

## 2017-01-13 VITALS — BP 141/85 | HR 91 | Temp 98.3°F | Resp 16 | Wt 259.0 lb

## 2017-01-13 DIAGNOSIS — Z79899 Other long term (current) drug therapy: Secondary | ICD-10-CM | POA: Insufficient documentation

## 2017-01-13 DIAGNOSIS — B373 Candidiasis of vulva and vagina: Secondary | ICD-10-CM | POA: Insufficient documentation

## 2017-01-13 DIAGNOSIS — B9689 Other specified bacterial agents as the cause of diseases classified elsewhere: Secondary | ICD-10-CM | POA: Insufficient documentation

## 2017-01-13 DIAGNOSIS — I10 Essential (primary) hypertension: Secondary | ICD-10-CM | POA: Insufficient documentation

## 2017-01-13 DIAGNOSIS — Z0000 Encounter for general adult medical examination without abnormal findings: Secondary | ICD-10-CM

## 2017-01-13 DIAGNOSIS — E1165 Type 2 diabetes mellitus with hyperglycemia: Secondary | ICD-10-CM | POA: Insufficient documentation

## 2017-01-13 DIAGNOSIS — E782 Mixed hyperlipidemia: Secondary | ICD-10-CM

## 2017-01-13 DIAGNOSIS — Z794 Long term (current) use of insulin: Secondary | ICD-10-CM | POA: Insufficient documentation

## 2017-01-13 DIAGNOSIS — N3000 Acute cystitis without hematuria: Secondary | ICD-10-CM | POA: Insufficient documentation

## 2017-01-13 DIAGNOSIS — E785 Hyperlipidemia, unspecified: Secondary | ICD-10-CM | POA: Insufficient documentation

## 2017-01-13 DIAGNOSIS — R300 Dysuria: Secondary | ICD-10-CM

## 2017-01-13 DIAGNOSIS — Z23 Encounter for immunization: Secondary | ICD-10-CM

## 2017-01-13 LAB — POCT URINALYSIS DIPSTICK
BILIRUBIN UA: NEGATIVE
Blood, UA: NEGATIVE
GLUCOSE UA: NEGATIVE
Ketones, UA: NEGATIVE
NITRITE UA: NEGATIVE
PH UA: 5 (ref 5.0–8.0)
Spec Grav, UA: 1.025 (ref 1.010–1.025)
UROBILINOGEN UA: 0.2 U/dL

## 2017-01-13 LAB — GLUCOSE, POCT (MANUAL RESULT ENTRY): POC Glucose: 141 mg/dl — AB (ref 70–99)

## 2017-01-13 LAB — POCT GLYCOSYLATED HEMOGLOBIN (HGB A1C): Hemoglobin A1C: 9.1

## 2017-01-13 MED ORDER — ATORVASTATIN CALCIUM 20 MG PO TABS: 20.0000 mg | ORAL_TABLET | Freq: Every day | ORAL | 2 refills | Status: DC

## 2017-01-13 MED ORDER — ALCOHOL PREP 70 % PADS: 1.0000 | MEDICATED_PAD | Freq: Once | 11 refills | Status: AC

## 2017-01-13 MED ORDER — NITROFURANTOIN MONOHYD MACRO 100 MG PO CAPS: 100.0000 mg | ORAL_CAPSULE | Freq: Two times a day (BID) | ORAL | 0 refills | Status: DC

## 2017-01-13 MED ORDER — INSULIN NPH ISOPHANE & REGULAR (70-30) 100 UNIT/ML ~~LOC~~ SUSP: 37.0000 [IU] | Freq: Two times a day (BID) | SUBCUTANEOUS | 3 refills | Status: DC

## 2017-01-13 MED ORDER — METFORMIN HCL 1000 MG PO TABS: ORAL_TABLET | ORAL | 2 refills | Status: DC

## 2017-01-13 MED ORDER — INSULIN SYRINGES (DISPOSABLE) U-100 1 ML MISC: 1.0000 | Freq: Once | 11 refills | Status: AC

## 2017-01-13 NOTE — Patient Instructions (Addendum)
Diabetes mellitus tipo2 en los adultos, cuidados personales (Type 2 Diabetes Mellitus, Self Care, Adult) El cuidado personal despus del diagnstico de diabetes tipo2 (diabetes mellitus tipo2) implica mantener el nivel de glucosa en la sangre bajo control a travs del equilibrio de los siguientes factores:  Nutricin.  Actividad fsica.  Cambios en el estilo de vida.  Medicamentos o insulina, si es necesario.  El apoyo del equipo de mdicos y de otras personas. La siguiente informacin explica lo que debe saber para mantener la diabetes bajo control en su casa. QU DEBO SABER PARA MANTENER LA GLUCEMIA BAJO CONTROL?  Contrlese la glucemia todos los das, con la frecuencia que le haya indicado el mdico.  Comunquese con el mdico si la glucemia est por encima del nivel ideal en 2anlisis seguidos.  Hgase controlar la hemoglobinaA1c al menos dos veces al ao o con la frecuencia que le haya indicado el mdico. El mdico establecer los objetivos personalizados de su tratamiento. Generalmente, el objetivo del tratamiento es mantener los siguientes niveles de glucosa en la sangre:  Antes de las comidas (preprandial): de 80 a 130mg/dl (4,4 a 7,2mmol/l).  Despus de las comidas (posprandial): por debajo de 180mg/dl (10mmol/l).  Nivel de A1c: menos del 7%. QU DEBO SABER SOBRE LA HIPERGLUCEMIA Y LA HIPOGLUCEMIA? Qu es la hiperglucemia? La hiperglucemia, tambin llamada glucemia alta, ocurre cuando el nivel de glucosa en la sangre es muy elevado.Asegrese de conocer los signos tempranos de hiperglucemia, por ejemplo:  Aumento de la sed.  Hambre.  Mucho cansancio.  Necesidad de orinar con mayor frecuencia que lo habitual.  Visin borrosa. Qu es la hipoglucemia? La hipoglucemia, tambin llamada glucemia baja, ocurre cuando el nivel de glucosa en la sangre es igual o menor que 70mg/dl (3,9mmol/l). El riesgo de hipoglucemia aumenta durante o despus de realizar  actividad fsica, mientras duerme, cuando est enfermo o si se saltea comidas o no come durante mucho tiempo (ayuna). Es importante conocer los sntomas de la hipoglucemia y tratarla de inmediato. Lleve siempre consigo una colacin de 15gramos hidratos de carbono de accin rpida para tratar la glucemia baja. Los familiares y los amigos cercanos tambin deben conocer los sntomas, y comprender cmo tratar la hipoglucemia, en caso de que usted no pueda tratarse a s mismo. Cules son los sntomas de la hipoglucemia? Los sntomas de hipoglucemia pueden incluir los siguientes:  Hambre.  Ansiedad.  Sudoracin y piel hmeda.  Confusin.  Mareos o sensacin de desvanecimiento.  Somnolencia.  Nuseas.  Aumento de la frecuencia cardaca.  Dolor de cabeza.  Visin borrosa.  Convulsiones.  Pesadillas.  Hormigueo o adormecimiento alrededor de la boca, los labios o la lengua.  Cambios en el habla.  Disminucin de la capacidad de concentracin.  Cambios en la coordinacin.  Sueo agitado.  Temblores o sacudidas.  Desmayos.  Irritabilidad. Cmo se trata la hipoglucemia? Si est alerta y puede tragar con seguridad, siga la regla de 15/15, que consiste en lo siguiente:  Tome 15gramos de hidratos de carbono de accin rpida. Las opciones de accin rpida incluyen lo siguiente: ? 1pomo de glucosa en gel. ? 3comprimidos de glucosa. ? 6 a 8unidades de caramelos duros. ? 4onzas (120ml) de jugo de frutas. ? 4onzas (120ml) de gaseosa comn (no diettica).  Contrlese la glucemia 15minutos despus de ingerir el hidrato de carbono.  Si este nuevo nivel de glucosa en la sangre an es de 70mg/dl o menor (3,9mmol/l), vuelva a ingerir 15gramos de un hidrato de carbono.  Si la glucemia no aumenta por   encima de 60m/dl (3,940ml/l) despus de 3intentos, solicite ayuda mdica de emergencia.  Ingiera una comida o una colacin en el transcurso de 1hora despus de que la  glucemia se haya normalizado. Cmo se trata la hipoglucemia grave? La hipoglucemia grave ocurre cuando la glucemia es igual o menor que 5421ml (3mm56ml). La hipoglucemia grave es una Engineer, maintenance (IT) espere hasta que los sntomas desaparezcan. Solicite atencin mdica de inmediato. Comunquese con el servicio de emergencias de su localidad (911 en los [place]). No conduzca por sus propios medios hastPrincipal Financial tiene hipoglucemia grave y no puede ingerir alimentos o bebidas, tal vez deba aplicarse una inyeccin de glucagn. Un familiar o un amigo cercano deben aprender a controlarle la glucemia y a aplicarle una inyeccin de glucagn. Pregntele al mdico si debe tener disponible un kit de inyecciones de glucagn de emerFreight forwarder posible que la hipoglucemia grave deba tratarse en un hospital. El tratamiento puede incluir la administracin de glucosa a travs de una va intravenosa (IV). Tambin puede necesitar un tratamiento para tratar la afeccin que est causando la hipoglucemia. TENER DIABETES PUEDE PONERME EN RIESGO DE SUFRIR OTRAS AFECCIONES? Tener diabetes puede ponerlo en riesgo de sufrir otras afecciones a largo plazo (crnicas), como cardiopata coronaria y enfermedades renales. El mdico puede recetar medicamentos para evitar las complicaciones causadas por la diabetes. Estos medicamentos pueden incluir los siguientes:  Aspirina.  Medicamentos para bajaAdvertising copywriteredicamentos para controlar la presin arterial. QU OTRAS COSAS PUEDO HACER PARA CONTROLAR LA DIABETES? Tome los medicamentos para la diabetes como se lo hayan indicado  Si el mdico le recet insulina o medicamentos para la diabetes, tmelos todos los [place] se quede sin insulina ni cualquier otro medicamento para la diabetes que tome. Planifique con antelacin para tenerlos siempre a su disposicin.  Si usa [place]ulina, ajuste las dosis en funcin de la cantidad de actividad fsica que realiza y de  los alimentos que consume. El mdico le indicar cmo ajustar las dosis. Opte por opciones de alimentos saludables Lo que come y bebe incide en la glucemia y las dosis de insuAubreycer buenas elecciones ayuda a mantener la diabetes bajo control y a evitTax inspectorsalud. Un plan de alimentacin saludable incluye consumir protenas magras, hidratos de carbono complejos, frutas y verduras frescas, productos lcteos con bajo contenido de grasDjiboutirasas saludables. Programe una cita con un especialista en alimentacin y nutricin (nutricionista certificado) para que lo ayude a armaJournalist, newspaperalimentacin adecuado para usted. Asegrese de lo siguiente:  Siga las indicaciones del mdico respecto de las restricciones para las comidas o las bebidas.  Beba suficiente lquido para mantConsulting civil engineerna clara o de color amarillo plido.  Ingiera colaciones saludables entre comidas nutritivas.  Haga un seguimiento de los hidratos de carbono que consume. Para hacerlo, lea las etiquetas de informacin nutricional y aprenda cules son los tamaos de las porciones estndar de los alimentos.  Siga el plan para los das de enfermedad cuando no pueda comer o beber normalmente. Arme este plan por adelantado con el mdico. Mantngase activo Haga actividad fsica habitualmente como se lo haya indicado el mdico. Esto puede incluir lo siguiente:  RealOptometristrcicios de elongacin y de fortalecimiento, como yoga o levantamiento de pesas, por lo menos 2veces por semana.  Hacer por lo menos 150mi43ms semanales de ejercicios de intensidad moderada o alta. Estos podran ser caminatas dinmicas, ciclismo o gimnaBenineparta la actividad en al menos 3das de la  semana. ? No deje pasar ms de 2das seguidos sin hacer algn tipo de actividad fsica. Cuando comience un ejercicio o una actividad nuevos, trabaje con el mdico para ajustar la [place], los medicamentos o la ingesta de comidas segn sea  necesario. Opte por un estilo de vida saludable  No consuma ningn producto que contenga tabaco, lo que incluye cigarrillos, tabaco de Higher education careers adviser y Psychologist, sport and exercise. Si necesita ayuda para dejar de fumar, consulte al mdico.  Si el mdico afirma que no corre riesgos al beber alcohol, limite su consumo a no ms de 87mdida por da si es mujer y no est e[place] y 285midas si es hombre. Una medida equivale a 12onzas de cerveza, 5onzas de vino o 1onzas de bebidas alcohlicas de alta graduacin.  Aprenda a maEngineer, maintenance (IT)Si necesita ayuda para lograrlo, consulte a su mdico. Cuide su cuerpo  Mantngase al da con las vacunas. Adems de aplicarse las vacunas como se lo haya indicado el mdico, es recomendable que se vacune contra las siguientes enfermedades: ? La gripe (influenza). Colquese la vacuna antigripal todos los aoE[place]? [place]. ? HepatitisB.  Programe un examen ocular inmediatamente despus del diagnstico y luego una vez por ao.  Examnese la piel y los pies en busca de cortes, moretones, enrojecimiento, ampollas o llagas. Programe una cita para que el mdViacomontrole los pies una vez por ao.  Cepllese los dientes y laPalmetto Bay use hilo dental al menos una vez por da. Visite al dentista al menos una vez cada 1m17ms.  Mantenga un peso saludable. Instrucciones generales  TomDelphi venta libre y los recetados solamente como se lo haya indicado el mdico.  Comparta su plan de control de la diabetes con sus compaeros de trabajo y de la Cytogeneticist con las personas con las que conTaylorContrlese las cetonas en la orina cuando est enfermo y como se lo haya indicado el mdico.  Pregntele al mdico: ? Debo reunirme con un Radio broadcast assistantra el cuidado de la diabetes? ? Dnde puedo encontrar un grupo de apoyo para personas diabticas?  Lleve una tarjeta de alerta mdica o use un brazalete o medalla de alerta  mdica.  Concurra a todas las visitas de control como se lo haya indicado el mdico. Esto es importante. DNDE ENCONTRAR MS INFORMACIN: Para obtener ms informacin sobre la diabetes, visite los siguientes sitios:  Asociacin Americana de la Diabetes (American Diabetes Association, ADA): www.diabetes.org  Asociacin Norteamericana de Instructores para el Cuidado de la Diabetes (American Association of Diabetes Educators, AADE): www.diabeteseducator.org/patient-resources Esta informacin no tiene comMarine scientist consejo del mdico. Asegrese de hacerle al mdico cualquier pregunta que tenga. Document Released: 10/12/2015 Document Revised: 10/12/2015 Document Reviewed: 07/24/2015 Elsevier Interactive Patient Education  2017 ElsOpp los adultos (Urinary Tract Infection, Adult) Una infeccin urinaria (IU) puede ocurrir en cuaClinical cytogeneticist las vas urinarias. Las vas urinarias incluyen lo siguiente:  Riones.  Urteres.  Vejiga.  Uretra. Estos rganos fabrican, almBuyer, retaileliminan la orina del organismo. CUIFinneys medicamentos de venta libre y los recetados solamente como se lo haya indicado el mdico.  Si le recetaron un antibitico, tmelo como se lo haya indicado el mdico. No deje de tomar los antibiticos aunque comience a sentirse mejor.  Evite beber lo siguiente: ? Alcohol. ? Cafena. ? T. ? Bebidas con gas.  Beba suficiente lquido para mantener el pis claro o de  color amarillo plido.  Concurra a todas las visitas de control como se lo haya indicado el mdico. Esto es importante.  Asegrese de lo siguiente: ? Vaciar la vejiga con frecuencia y en su totalidad. No contener la orina durante largos perodos. ? Vaciar la vejiga antes y despus de Clinical biochemist. ? Limpiar de adelante hacia atrs despus de defecar, si es mujer. Usar cada trozo de papel una vez cuando se limpie. SOLICITE AYUDA  SI:  Siente dolor en la espalda.  Tiene fiebre.  Siente malestar estomacal (nuseas).  Vomita.  Los sntomas no mejoran despus de 3das de tratamiento.  Los sntomas desaparecen y Teacher, adult education. SOLICITE AYUDA DE INMEDIATO SI:  Siente un dolor muy intenso en la espalda.  Siente un dolor muy intenso en la parte inferior del abdomen.  Tiene vmitos y no puede retener los medicamentos ni el agua. Esta informacin no tiene Marine scientist el consejo del mdico. Asegrese de hacerle al mdico cualquier pregunta que tenga. Document Released: 12/08/2009 Document Revised: 10/12/2015 Document Reviewed: 05/11/2015 Elsevier Interactive Patient Education  2018 [place] Td (contra la difteria y el ttanos): Lo que debe saber (Td Vaccine Margaretmary Eddy and Diphtheria]: What You Need to Know) 1. Por qu vacunarse? El ttanos y la difteria son enfermedades muy graves. Son Futures trader frecuentes en los [place] actualmente, pero las personas que se infectan suelen tener complicaciones graves. La vacuna Td se [place] para proteger a los adolescentes y a los adultos de ambas enfermedades. Tanto el ttanos como la difteria son infecciones causadas por bacterias. La difteria se transmite de persona a persona a travs de la tos o el estornudo. La bacteria que causa el ttanos entra al cuerpo a travs de cortes, raspones o heridas. El TTANOS (trismo) provoca entumecimiento y Writer dolorosa de los msculos, por lo general, en todo el cuerpo.  Puede causar el endurecimiento de los msculos de la cabeza y el cuello, de modo que impide abrir la boca, tragar y en algunos casos, Ambulance person. El ttanos es causa de muerte en aproximadamente 1de cada 10personas que contraen la infeccin, incluso despus de que reciben la mejor atencin mdica. La DIFTERIA puede hacer que se forme una membrana gruesa en la parte posterior de la garganta.  Puede causar problemas respiratorios, parlisis,  insuficiencia cardaca e incluso la muerte. Antes de las vacunas, en los [place] se informaban 200000 casos de difteria y cientos de casos de ttanos cada ao. Desde que comenz la vacunacin, los informes de casos de ambas enfermedades se han reducido en un 99%. 2. Edward Jolly Td La vacuna Td protege a adolescentes y adultos contra el ttanos y la difteria. La vacuna Td habitualmente se aplica como dosis de refuerzo cada 10aos, pero tambin puede administrarse antes si la persona sufre una [place] o herida sucia y grave. A veces, en lugar de la vacuna Td, se recomienda una vacuna llamada Tdap, que protege contra la tosferina, adems de proteger contra el ttanos y la difteria. El mdico o la persona que le aplique la vacuna puede darle ms informacin al [employer]. La Td puede administrarse de manera segura simultneamente con otras vacunas. 3. Algunas personas no deben recibir la vacuna  Una persona que alguna vez ha tenido una reaccin alrgica potencialmente mortal a una dosis anterior de cualquier vacuna contra el ttanos o la difteria, O que tenga una alergia grave a cualquier parte de esta vacuna, no debe recibir la vacuna Td. Informe a la Engineer, maintenance (IT)  vacuna si usted tiene cualquier alergia grave.  Consulte con su mdico si: ? tuvo hinchazn o dolor intenso despus de recibir cualquier vacuna contra la difteria o el ttanos, ? alguna vez ha sufrido el sndrome de Pension scheme manager, ? no se siente Research scientist (life sciences) en que se ha programado la vacuna.  4. Riesgos de [place] reaccin a la vacuna Con cualquier medicamento, incluyendo las vacunas, existe la posibilidad de que aparezcan efectos secundarios. Suelen ser leves y desaparecen por s solos. Tambin son posibles las reacciones graves, pero en raras ocasiones. La [employer] de las personas a las que se les aplica la vacuna Td no tienen ningn problema. Problemas leves despus de la vacuna Td: (No interfirieron en otras  actividades)  Dolor en el lugar donde se aplic la vacuna (alrededor de 8de cada 10personas)  Enrojecimiento o hinchazn en el lugar donde se aplic la vacuna (alrededor de 1de cada 4personas)  Fiebre leve (poco frecuente)  Dolor de Training and development officer (alrededor de 1de cada 4personas)  Cansancio (alrededor de 1de cada 4personas) Problemas moderados despus de la vacuna Td: (Interfirieron en otras actividades, pero no requirieron atencin mdica)  Fiebre superior a 102F (38,8C) (poco frecuente) Problemas graves despus de la vacuna Td: (Impidieron Education officer, environmental las actividades habituales; requirieron atencin mdica)  Buyer, retail, dolor intenso, sangrado o enrojecimiento en el brazo en que se aplic la vacuna (poco frecuente). Problemas que podran ocurrir despus de cualquier vacuna:  Las personas a veces se desmayan despus de un procedimiento mdico, incluida la vacunacin. Si permanece sentado o recostado durante 15 minutos puede ayudar a [employer] y las lesiones causadas por las cadas. Informe al mdico si se siente mareado, tiene cambios en la visin o zumbidos en los odos.  Algunas personas sienten un dolor intenso en el hombro y tienen dificultad para mover el brazo donde se coloc la vacuna. Esto sucede con muy poca frecuencia.  Cualquier medicamento puede causar una reaccin alrgica grave. Dichas reacciones son Lynnae Sandhoff poco frecuentes con una vacuna (se calcula que menos de 1en un milln de dosis) y se producen de unos minutos a unas horas despus de Arts development officer. Al igual que con cualquier [employer], existe una probabilidad muy remota de que una vacuna cause una lesin grave o la [place]. Se controla permanentemente la seguridad de las vacunas. Para obtener ms informacin, visite: http://floyd.org/. 5. Qu pasa si hay una reaccin grave? A qu signos debo estar atento?  Observe todo lo que le preocupe, como signos de una reaccin alrgica grave, fiebre  muy alta o comportamiento fuera de lo normal. Los signos de una reaccin alrgica grave pueden incluir ronchas, hinchazn de la cara y la garganta, dificultad para respirar, latidos cardacos acelerados, mareos y debilidad. Generalmente, estos comenzaran entre unos pocos minutos y algunas horas despus de la vacunacin. Qu debo hacer?  Si usted piensa que se trata de una reaccin alrgica grave o de otra emergencia que no puede esperar, llame al 911 o dirjase al hospital ms cercano. Sino, llame a su mdico.  Despus, la reaccin debe informarse al [address] de Informacin sobre Efectos Adversos de las [place] (Vaccine Adverse Event Reporting System, VAERS). Su mdico puede presentar este informe, o puede hacerlo usted mismo a travs del sitio web de VAERS, en www.vaers.LAgents.no, o llamando al [phone]. VAERS no brinda recomendaciones mdicas. 6. [employer] de Compensacin de Daos por [employer] [place] de Compensacin de Daos por Administrator, arts (National Vaccine Injury Compensation Program, VICP) es un programa federal que fue creado para Patent examiner  a las [place] [employer] puedan haber sufrido daos al recibir ciertas vacunas. Aquellas personas que consideren que han sufrido un dao como consecuencia de una vacuna y [place] saber ms acerca del programa y de cmo presentar [place]hel [place], pueden llamar al [phone] o visitar su sitio web en GoldCloset.[email or web address].ee. Hay un lmite de tiempo para presentar un reclamo de compensacin. 7. Cmo puedo obtener ms informacin?  Consulte a su mdico. Este puede darle el prospecto de la vacuna o recomendarle otras fuentes de informacin.  Comunquese con el servicio de salud de su localidad o su estado.  Comunquese con los Centros para Building surveyor y la Prevencin de Probation officer for Disease Control and Prevention , CDC). ? Llame al [phone] (1-800-CDC-INFO). ? Visite el sitio Biomedical engineer en  http://hunter.com/. Declaracin de informacin sobre la vacuna contra la difteria y el ttanos (Td) de los CDC (10/13/15) Esta informacin no tiene Marine scientist el consejo del mdico. Asegrese de hacerle al mdico cualquier pregunta que tenga. Document Released: 10/06/2008 Document Revised: 07/11/2014 Document Reviewed: 10/13/2015 Elsevier Interactive Patient Education  2017 [employer].

## 2017-01-13 NOTE — Progress Notes (Signed)
Subjective:  Patient ID: Julie Robertson, female    DOB: 1976/01/30  Age: 41 y.o. MRN: [id number]  CC: Diabetes and Medication Refill  Interpreter services used: Minna Merritts [id number]  HPI Sharlot Gowda Laurin Coder presents for follow up for DM and Hypertension. History of diabetes. Symptoms: hyperglycemia. Patient denies foot ulcerations, nausea, paresthesia of the feet, polydipsia, polyuria, visual disturbances and vomitting.  Evaluation to date has been included: fasting blood sugar, fasting lipid panel, hemoglobin A1C and microalbuminuria.  Home sugars: BGs are running  consistent with Hgb A1C. Treatment to date: Metformin and insulin. She reports only taking Humulin insulin 38 units once daily.She is not exercising and is not adherent to low salt diet.  She doesn't check BP at home. Cardiac symptoms none. Patient denies chest pain, chest pressure/discomfort, claudication, dyspnea, near-syncope, palpitations and syncope.  Cardiovascular risk factors: diabetes mellitus, dyslipidemia, hypertension, obesity (BMI >= 30 kg/m2) and sedentary lifestyle. Use of agents associated with hypertension: none. History of target organ damage: none. She also complaints of dysuria. Symptoms include burning with urination, dysuria and incomplete bladder emptying She has had symptoms for 2 weeks. Patient denies back pain, fever and vaginal discharge. Patient does not have a history of recurrent UTI.  Patient does not have a history of pyelonephritis.   Outpatient Medications Prior to Visit  Medication Sig Dispense Refill  . albuterol (PROVENTIL HFA;VENTOLIN HFA) 108 (90 Base) MCG/ACT inhaler Inhale 2 puffs into the lungs every 6 (six) hours as needed for wheezing or shortness of breath. 1 Inhaler 2  . Blood Glucose Monitoring Suppl (TRUE METRIX METER) W/DEVICE KIT Use as directed 1 kit 0  . gabapentin (NEURONTIN) 300 MG capsule TAKE 1 CAPSULE BY MOUTH 3 TIMES DAILY. 90 capsule 2  . glucose blood (TRUE METRIX  BLOOD GLUCOSE TEST) test strip Use as instructed 100 each 12  . ibuprofen (ADVIL,MOTRIN) 200 MG tablet Take 400 mg by mouth every 6 (six) hours as needed for moderate pain.    . Insulin Pen Needle 31G X 5 MM MISC Inject 10 units subcutaneous at bedtime 100 each 0  . methocarbamol (ROBAXIN) 750 MG tablet Take 1 tablet (750 mg total) by mouth every 8 (eight) hours as needed for muscle spasms. (Patient not taking: Reported on 10/18/2016) 30 tablet 0  . naproxen (NAPROSYN) 500 MG tablet Take 1 tablet (500 mg total) by mouth 2 (two) times daily with a meal. (Patient not taking: Reported on 10/18/2016) 40 tablet 0  . TRUEPLUS LANCETS 28G MISC Use as directed 100 each 12  . atorvastatin (LIPITOR) 20 MG tablet TAKE 1 TABLET BY MOUTH DAILY. 30 tablet 0  . insulin NPH-regular Human (HUMULIN 70/30) (70-30) 100 UNIT/ML injection Inject 37 Units into the skin 2 (two) times daily with a meal. 90 mL 3  . insulin NPH-regular Human (NOVOLIN 70/30) (70-30) 100 UNIT/ML injection Inject 37 Units into the skin 2 (two) times daily with a meal. 10 mL 11  . metFORMIN (GLUCOPHAGE) 1000 MG tablet TAKE 1 TABLET BY MOUTH 2 TIMES DAILY WITH A MEAL. 60 tablet 0   No facility-administered medications prior to visit.   i  ROS Review of Systems  Constitutional: Negative.   Eyes: Negative.   Respiratory: Negative.   Cardiovascular: Negative.   Gastrointestinal: Negative.   Genitourinary: Positive for decreased urine volume and dysuria.  Skin: Negative.    Objective:  BP (!) 141/85   Pulse 91   Temp 98.3 F (36.8 C) (Oral)   Resp 16   Wt  259 lb (117.5 kg)   SpO2 99%   BMI 43.10 kg/m   BP/Weight 01/13/2017 2/67/1245 8/0/9983  Systolic BP 382 505 397  Diastolic BP 85 89 81  Wt. (Lbs) 259 260 268.8  BMI 43.1 43.27 44.73    Physical Exam  Constitutional: She appears well-developed and well-nourished.  Eyes: Pupils are equal, round, and reactive to light. Conjunctivae are normal.  Neck: No JVD present.    Cardiovascular: Normal rate, regular rhythm, normal heart sounds and intact distal pulses.   Pulmonary/Chest: Effort normal and breath sounds normal.  Abdominal: Soft. Bowel sounds are normal. There is no tenderness.  Skin: Skin is warm and dry.  Psychiatric: She has a normal mood and affect.  Nursing note and vitals reviewed.   Assessment & Plan:   Problem List Items Addressed This Visit      Other   Hyperlipidemia   Relevant Orders   Lipid Panel (Completed)    Other Visit Diagnoses    Uncontrolled type 2 diabetes mellitus with hyperglycemia, with long-term current use of insulin ([place])    -  Primary   Check CBG TID bring glucometer and log to next office visit   Follow up with clinical pharmacist in 2 weeks for DM/HTN   Follow up with PCP in  3 months.   Relevant Medications   insulin NPH-regular Human (HUMULIN 70/30) (70-30) 100 UNIT/ML injection   metFORMIN (GLUCOPHAGE) 1000 MG tablet   Other Relevant Orders   POCT glycosylated hemoglobin (Hb A1C) (Completed)   Glucose (CBG) (Completed)   POCT urinalysis dipstick (Completed)   Ambulatory referral to Ophthalmology   Acute cystitis without hematuria       Relevant Medications   nitrofurantoin, macrocrystal-monohydrate, (MACROBID) 100 MG capsule   Dysuria       Relevant Orders   POCT urinalysis dipstick (Completed)   Urine cytology ancillary only (Completed)   Essential hypertension       Healthcare maintenance       Relevant Orders   Tdap vaccine greater than or equal to 7yo IM (Completed)      Meds ordered this encounter  Medications  . insulin NPH-regular Human (HUMULIN 70/30) (70-30) 100 UNIT/ML injection    Sig: Inject 37 Units into the skin 2 (two) times daily with a meal.    Dispense:  90 mL    Refill:  3    Order Specific Question:   Supervising Provider    Answer:   Tresa Garter [id number]  . metFORMIN (GLUCOPHAGE) 1000 MG tablet    Sig: TAKE 1 TABLET BY MOUTH 2 TIMES DAILY WITH A MEAL.     Dispense:  60 tablet    Refill:  2    Order Specific Question:   Supervising Provider    Answer:   Tresa Garter [id number]  . nitrofurantoin, macrocrystal-monohydrate, (MACROBID) 100 MG capsule    Sig: Take 1 capsule (100 mg total) by mouth 2 (two) times daily.    Dispense:  10 capsule    Refill:  0    Order Specific Question:   Supervising Provider    Answer:   Tresa Garter [id number]  . Insulin Syringes, Disposable, U-100 1 ML MISC    Sig: 1 kit by Does not apply route once.    Dispense:  100 each    Refill:  11    Order Specific Question:   Supervising Provider    Answer:   Tresa Garter [id number]  . Alcohol Swabs (ALCOHOL PREP) 70 %  PADS    Sig: 1 kit by Does not apply route once.    Dispense:  100 each    Refill:  11    Order Specific Question:   Supervising Provider    Answer:   Tresa Garter [id number]  . DISCONTD: atorvastatin (LIPITOR) 20 MG tablet    Sig: Take 1 tablet (20 mg total) by mouth daily.    Dispense:  30 tablet    Refill:  2    Order Specific Question:   Supervising Provider    Answer:   Tresa Garter [id number]    Follow-up: Return in about 2 weeks (around 01/27/2017) for DM / BP check with clinical pharmacist.   Alfonse Spruce FNP

## 2017-01-16 ENCOUNTER — Ambulatory Visit: Payer: Self-pay | Attending: Family Medicine

## 2017-01-16 DIAGNOSIS — E782 Mixed hyperlipidemia: Secondary | ICD-10-CM

## 2017-01-17 LAB — URINE CYTOLOGY ANCILLARY ONLY
CHLAMYDIA, DNA PROBE: NEGATIVE
NEISSERIA GONORRHEA: NEGATIVE
TRICH (WINDOWPATH): NEGATIVE

## 2017-01-18 ENCOUNTER — Other Ambulatory Visit: Payer: Self-pay | Admitting: Family Medicine

## 2017-01-18 DIAGNOSIS — E782 Mixed hyperlipidemia: Secondary | ICD-10-CM

## 2017-01-18 LAB — LIPID PANEL
CHOLESTEROL TOTAL: 128 mg/dL (ref 100–199)
Chol/HDL Ratio: 4 ratio (ref 0.0–4.4)
HDL: 32 mg/dL — AB (ref >39)
LDL Calculated: 64 mg/dL (ref 0–99)
TRIGLYCERIDES: 161 mg/dL — AB (ref 0–149)
VLDL Cholesterol Cal: 32 mg/dL (ref 5–40)

## 2017-01-18 MED ORDER — ATORVASTATIN CALCIUM 40 MG PO TABS: 40.0000 mg | ORAL_TABLET | Freq: Every day | ORAL | 2 refills | Status: DC

## 2017-01-19 LAB — URINE CYTOLOGY ANCILLARY ONLY
Bacterial vaginitis: NEGATIVE
CANDIDA VAGINITIS: POSITIVE — AB

## 2017-01-20 ENCOUNTER — Other Ambulatory Visit: Payer: Self-pay | Admitting: Family Medicine

## 2017-01-20 ENCOUNTER — Telehealth: Payer: Self-pay

## 2017-01-20 DIAGNOSIS — B373 Candidiasis of vulva and vagina: Secondary | ICD-10-CM

## 2017-01-20 DIAGNOSIS — B3731 Acute candidiasis of vulva and vagina: Secondary | ICD-10-CM

## 2017-01-20 MED ORDER — FLUCONAZOLE 150 MG PO TABS: 150.0000 mg | ORAL_TABLET | Freq: Once | ORAL | 0 refills | Status: AC

## 2017-01-20 NOTE — Telephone Encounter (Signed)
CMA call regarding lab results   Patient verify DOB  Patient was aware and understood  

## 2017-01-20 NOTE — Telephone Encounter (Signed)
-----   Message from Alfonse Spruce, [place] sent at 01/20/2017 12:10 PM EDT ----- Yeast was positive. You will be prescribed diflucan to treat. To reduce your risk of developing yeast don't douche, don't use scented soap or sprays, and wear cotton undergarments.

## 2017-01-20 NOTE — Telephone Encounter (Signed)
-----   Message from Alfonse Spruce, [place] sent at 01/18/2017  4:52 PM EDT ----- Gonorrhea, Chlamydia, and Trichomonas were all negative. Triglyceride levels improved but are still elevated. You dose of atorvastatin will be increased.

## 2017-01-26 ENCOUNTER — Ambulatory Visit: Payer: Self-pay | Attending: Family Medicine | Admitting: Pharmacist

## 2017-01-26 VITALS — BP 131/87 | HR 85

## 2017-01-26 DIAGNOSIS — R030 Elevated blood-pressure reading, without diagnosis of hypertension: Secondary | ICD-10-CM | POA: Insufficient documentation

## 2017-01-26 DIAGNOSIS — Z794 Long term (current) use of insulin: Secondary | ICD-10-CM | POA: Insufficient documentation

## 2017-01-26 DIAGNOSIS — E1165 Type 2 diabetes mellitus with hyperglycemia: Secondary | ICD-10-CM

## 2017-01-26 DIAGNOSIS — E119 Type 2 diabetes mellitus without complications: Secondary | ICD-10-CM | POA: Insufficient documentation

## 2017-01-26 DIAGNOSIS — E782 Mixed hyperlipidemia: Secondary | ICD-10-CM

## 2017-01-26 DIAGNOSIS — G629 Polyneuropathy, unspecified: Secondary | ICD-10-CM | POA: Insufficient documentation

## 2017-01-26 DIAGNOSIS — Z8709 Personal history of other diseases of the respiratory system: Secondary | ICD-10-CM

## 2017-01-26 DIAGNOSIS — Z79899 Other long term (current) drug therapy: Secondary | ICD-10-CM | POA: Insufficient documentation

## 2017-01-26 DIAGNOSIS — R351 Nocturia: Secondary | ICD-10-CM | POA: Insufficient documentation

## 2017-01-26 LAB — GLUCOSE, POCT (MANUAL RESULT ENTRY): POC Glucose: 222 mg/dl — AB (ref 70–99)

## 2017-01-26 MED ORDER — GABAPENTIN 300 MG PO CAPS: 300.0000 mg | ORAL_CAPSULE | Freq: Three times a day (TID) | ORAL | 0 refills | Status: DC

## 2017-01-26 MED ORDER — METFORMIN HCL 1000 MG PO TABS: ORAL_TABLET | ORAL | 0 refills | Status: DC

## 2017-01-26 MED ORDER — INSULIN NPH ISOPHANE & REGULAR (70-30) 100 UNIT/ML ~~LOC~~ SUSP: 37.0000 [IU] | Freq: Two times a day (BID) | SUBCUTANEOUS | 3 refills | Status: DC

## 2017-01-26 MED ORDER — ALBUTEROL SULFATE HFA 108 (90 BASE) MCG/ACT IN AERS: 2.0000 | INHALATION_SPRAY | Freq: Four times a day (QID) | RESPIRATORY_TRACT | 0 refills | Status: DC | PRN

## 2017-01-26 MED ORDER — ATORVASTATIN CALCIUM 40 MG PO TABS: 40.0000 mg | ORAL_TABLET | Freq: Every day | ORAL | 0 refills | Status: DC

## 2017-01-26 NOTE — Progress Notes (Signed)
    S:     Chief Complaint  Patient presents with  . Medication Management    Patient arrives in good spirits.  Presents for diabetes evaluation, education, and management at the request of [facility] [place]/Dr. Jegede. Patient was referred on 01/13/17.  Patient was last seen by Primary Care Provider on 01/13/17. Spanish interpreter Nevin Bloodgood [phone] was used for the entirety of the visit.  Patient reports adherence with medications. However she does not check her blood sugar at home. She reports having a meter but does not use it. Patient reports that she is leaving the country for 2 months and needs enough medication to last her for her trip.  Current diabetes medications include: Novolin 70/30 37 units twice daily and metformin 1000 mg BID. Current blood pressure medications include: none  Patient denies hypoglycemic events.  Patient reported dietary habits: doesn't follow a particular diet. Admits to eating something sweet before her visit today.  Patient reported exercise habits: none   Patient reports nocturia about 1 time per night.  Patient reports neuropathy. Patient denies visual changes. Patient reports self foot exams.    O:  Physical Exam   ROS   Lab Results  Component Value Date   HGBA1C 9.1 01/13/2017   There were no vitals filed for this visit.  POCT glucose = 222 (post-prandial)  A/P: Diabetes longstanding currently uncontrolled based on A1c.of 9.1. Patient denies hypoglycemic events and is able to verbalize appropriate hypoglycemia management plan. Patient reports adherence with medication. Control is suboptimal due to dietary indiscretion and nonadherence to medications.   Unable to access home CBG control. Denies s/sx of hypo. Stressed the importance of checking blood glucose at home while on insulin. Patient agreed to start checking. No changes to her medications at this time. Patient is leaving the country for 2 months so ordered a 90 day supply on  medications.   Next A1C anticipated October 2018.    Recent elevated blood pressure currently <140/90.  Will continue to monitor.  Written patient instructions provided.  Total time in face to face counseling 15 minutes.   Follow up in Pharmacist Clinic Visit PRN, next visit with PCP when she returns from [place].

## 2017-01-26 NOTE — Patient Instructions (Signed)
Thank you for coming to see me  Follow up with Fredia Beets when you come back from your trip   Control del nivel sanguneo de glucosa en los adultos (Blood Glucose Monitoring, Adult) El control del nivel de azcar (glucosa) en la sangre lo ayuda a tener la diabetes bajo control. Tambin ayuda a que usted y el mdico controlen si el tratamiento de la diabetes es Armed forces logistics/support/administrative officer. El control del nivel sanguneo de glucosa implica realizar controles regulares como lo indique el mdico y Catering manager registro de los resultados (registro diario). POR QU DEBO [place] DE GLUCOSA? Si controla su nivel sanguneo de glucosa con regularidad, podr:  Comprender de [employer], la actividad fsica, las enfermedades y los medicamentos inciden en los niveles sanguneos de [place].  Conocer el nivel sanguneo de glucosa en cualquier momento dado. Saber rpidamente si el nivel es bajo (hipoglucemia) o alto (hiperglucemia).  Puede ser de ayuda para que usted y el mdico sepan cmo ajustar los medicamentos. [place] DE GLUCOSA? Siga las indicaciones del mdico acerca de la frecuencia con la que debe controlar el nivel sanguneo de glucosa. La frecuencia puede depender de:  El tipo de diabetes que tenga.  Si su diabetes est bajo control.  Los medicamentos que toma. Si usted tiene diabetes tipo 1:  Controle su nivel sanguneo de glucosa al [employer] al da.  Tambin controle su nivel sanguneo de glucosa: ? Antes de cada inyeccin de insulina. ? Antes y despus de hacer ejercicio. ? [place] comidas. ? Dos horas despus de una comida. ? Ocasionalmente, entre las 2:00a.m. y las 3:00a.m., como se lo hayan indicado. ? Antes de Optometrist tareas peligrosas, English as a second language teacher o usar maquinaria pesada. ? A la hora de acostarse.  Es posible que Geophysicist/field seismologist con ms frecuencia los niveles sanguneos de [place], [place] 6 a 10veces por  da: ? Si [place] una bomba de [place]. ? Si necesita varias inyecciones diarias. ? Si su diabetes no est bien controlada. ? Si est enfermo. ? Si tiene antecedentes de hipoglucemia grave. ? Si tiene antecedentes de no darse cuenta cundo est bajando su nivel sanguneo de glucosa (hipoglucemia asintomtica). Si usted tiene diabetes tipo 2:  Si recibe insulina u otro medicamento para la diabetes, controle el nivel sanguneo de glucosa al [employer] veces al da.  Mientras reciba tratamiento intensivo con insulina, debe medirse el nivel sanguneo de glucosa al menos 4veces al [employer]. Ocasionalmente, es posible que deba controlarse entre las 2:00a.m. y las 3:00a.m., segn se lo indiquen.  Tambin controle su nivel sanguneo de glucosa: ? Antes y despus de hacer ejercicio. ? Antes de Optometrist tareas peligrosas, English as a second language teacher o usar maquinaria pesada.  Es posible que Geophysicist/field seismologist con ms frecuencia los niveles sanguneos de glucosa si: ? Es necesario ajustar la dosis de sus medicamentos. ? Su diabetes no est bien controlada. ? Est enfermo. QU ES UN REGISTRO [place] DE GLUCOSA?  Un registro diario es un registro de los valores de glucosa en la [place]. Puede ayudarles a usted y a su mdico a: ? Health visitor en su nivel sanguneo de glucosa durante el transcurso del [place]. ? Ajustar su plan de control de la diabetes como sea necesario.  Cada vez que controle su nivel sanguneo de glucosa, anote el resultado y aquellos factores que pueden estar afectando su nivel sanguneo de glucosa, como la dieta y la actividad fsica Designer, multimedia.  La  mayora de los medidores de glucosa guardan un registro de las lecturas realizadas con el medidor. Algunos permiten descargar sus registros en una computadora.  [place] DE GLUCOSA? Siga los siguientes pasos para obtener lecturas precisas de su glucemia: Materiales necesarios  Medidor de  Printmaker.  Tiras reactivas para el medidor. Cada medidor tiene sus propias tiras reactivas. Debe usar las tiras reactivas que trae su medidor.  Una aguja para pincharse el dedo (lanceta). No utilice la misma lanceta en ms de una ocasin.  Un dispositivo que sujeta la lanceta (dispositivo de puncin).  Un diario o libro de anotaciones para [employer]. Procedimiento  [employer] con agua y [place].  Pnchese el costado del dedo (no la punta) con Retail buyer. Use un dedo diferente cada vez.  Frote suavemente el dedo [employer] aparezca una pequea gota de [place].  Siga las instrucciones que vienen con el medidor para Garment/textile technologist tira Comptroller, Midwife la sangre sobre la tira y usar el medidor de Printmaker.  Registre el resultado y las observaciones que desee. Zonas del cuerpo alternativas para realizar las pruebas  Algunos medidores le permiten tomar sangre para la prueba de otras zonas del cuerpo que no son el dedo (zonas alternativas).  Si cree que tiene hipoglucemia o si tiene hipoglucemia asintomtica, no utilice las zonas alternativas del cuerpo. En su lugar, use los dedos.  Es posible que las zonas alternativas no sean tan precisas como los dedos porque el flujo de sangre es ms lento en esas zonas. Esto significa que el resultado que obtiene de estas zonas puede estar retrasado y ser un poco diferente del resultado que obtendra del dedo.  Los sitios alternativos ms comunes son los siguientes: ? Los antebrazos. ? Los muslos. ? La palma de [employer]. Consejos adicionales  Siempre tenga los insumos a mano.  Todos los medidores de glucosa incluyen un nmero de telfono "directo", disponible las 24 horas, al que podr llamar si tiene preguntas o [place]. Tambin puede consultar a su mdico.  Despus de usar algunas cajas de tiras reactivas, ajuste (calibre) el medidor de glucemia segn las instrucciones del medidor. Esta informacin no  tiene Marine scientist el consejo del mdico. Asegrese de hacerle al mdico cualquier pregunta que tenga. Document Released: 06/20/2005 Document Revised: 10/12/2015 Document Reviewed: 11/30/2015 Elsevier Interactive Patient Education  2017 [employer].

## 2017-03-23 ENCOUNTER — Ambulatory Visit: Payer: Self-pay

## 2017-03-24 ENCOUNTER — Ambulatory Visit: Payer: Self-pay | Attending: Family Medicine

## 2017-03-30 ENCOUNTER — Ambulatory Visit: Payer: Self-pay

## 2017-04-12 ENCOUNTER — Encounter: Payer: Self-pay | Admitting: Family Medicine

## 2017-04-12 ENCOUNTER — Ambulatory Visit: Payer: Self-pay | Attending: Family Medicine | Admitting: Family Medicine

## 2017-04-12 VITALS — BP 131/82 | HR 81 | Temp 98.3°F | Resp 18 | Ht 65.0 in | Wt 262.4 lb

## 2017-04-12 DIAGNOSIS — E1159 Type 2 diabetes mellitus with other circulatory complications: Secondary | ICD-10-CM | POA: Insufficient documentation

## 2017-04-12 DIAGNOSIS — E782 Mixed hyperlipidemia: Secondary | ICD-10-CM | POA: Insufficient documentation

## 2017-04-12 DIAGNOSIS — H60503 Unspecified acute noninfective otitis externa, bilateral: Secondary | ICD-10-CM | POA: Insufficient documentation

## 2017-04-12 DIAGNOSIS — Z794 Long term (current) use of insulin: Secondary | ICD-10-CM | POA: Insufficient documentation

## 2017-04-12 DIAGNOSIS — E669 Obesity, unspecified: Secondary | ICD-10-CM | POA: Insufficient documentation

## 2017-04-12 DIAGNOSIS — E1165 Type 2 diabetes mellitus with hyperglycemia: Secondary | ICD-10-CM | POA: Insufficient documentation

## 2017-04-12 DIAGNOSIS — I10 Essential (primary) hypertension: Secondary | ICD-10-CM | POA: Insufficient documentation

## 2017-04-12 DIAGNOSIS — E119 Type 2 diabetes mellitus without complications: Secondary | ICD-10-CM

## 2017-04-12 DIAGNOSIS — M791 Myalgia, unspecified site: Secondary | ICD-10-CM | POA: Insufficient documentation

## 2017-04-12 DIAGNOSIS — Z6830 Body mass index (BMI) 30.0-30.9, adult: Secondary | ICD-10-CM | POA: Insufficient documentation

## 2017-04-12 DIAGNOSIS — Z79899 Other long term (current) drug therapy: Secondary | ICD-10-CM | POA: Insufficient documentation

## 2017-04-12 DIAGNOSIS — Z23 Encounter for immunization: Secondary | ICD-10-CM | POA: Insufficient documentation

## 2017-04-12 LAB — GLUCOSE, POCT (MANUAL RESULT ENTRY): POC Glucose: 197 mg/dl — AB (ref 70–99)

## 2017-04-12 LAB — POCT GLYCOSYLATED HEMOGLOBIN (HGB A1C): Hemoglobin A1C: 10.4

## 2017-04-12 MED ORDER — METFORMIN HCL 500 MG PO TABS: 1000.0000 mg | ORAL_TABLET | Freq: Two times a day (BID) | ORAL | 3 refills | Status: DC

## 2017-04-12 MED ORDER — GLIPIZIDE 5 MG PO TABS: 5.0000 mg | ORAL_TABLET | Freq: Every day | ORAL | 2 refills | Status: DC

## 2017-04-12 MED ORDER — OFLOXACIN 0.3 % OT SOLN: 10.0000 [drp] | Freq: Every day | OTIC | 0 refills | Status: DC

## 2017-04-12 MED ORDER — TRUE METRIX METER W/DEVICE KIT: PACK | 0 refills | Status: DC

## 2017-04-12 MED ORDER — INSULIN NPH ISOPHANE & REGULAR (70-30) 100 UNIT/ML ~~LOC~~ SUSP: 39.0000 [IU] | Freq: Two times a day (BID) | SUBCUTANEOUS | 3 refills | Status: DC

## 2017-04-12 MED ORDER — LISINOPRIL 5 MG PO TABS: 5.0000 mg | ORAL_TABLET | Freq: Every day | ORAL | 3 refills | Status: DC

## 2017-04-12 MED ORDER — GLUCOSE BLOOD VI STRP: ORAL_STRIP | 12 refills | Status: DC

## 2017-04-12 MED ORDER — METFORMIN HCL ER 500 MG PO TB24: 1000.0000 mg | ORAL_TABLET | Freq: Two times a day (BID) | ORAL | 3 refills | Status: DC

## 2017-04-12 MED ORDER — ATORVASTATIN CALCIUM 40 MG PO TABS: 40.0000 mg | ORAL_TABLET | Freq: Every day | ORAL | 3 refills | Status: DC

## 2017-04-12 MED ORDER — TRUEPLUS LANCETS 28G MISC: 12 refills | Status: DC

## 2017-04-12 NOTE — Progress Notes (Signed)
Subjective:  Patient ID: Julie Robertson, female    DOB: 01/16/1976  Age: 41 y.o. MRN: [id number]  CC: Diabetes   HPI Julie Robertson presents for follow up for DM and Hypertension. History of diabetes. Symptoms: hyperglycemia. Patient denies foot ulcerations, nausea, paresthesia of the feet, polydipsia, polyuria, visual disturbances and vomitting.  Evaluation to date has been included: fasting blood sugar, fasting lipid panel, hemoglobin A1C and microalbuminuria.  Home sugars: patient is not consistently checking blood sugars. She reports only taking metformin daily instead of twice a day as prescribed.She is not adherent with modified carbohydrate diet. Treatment to date: Metformin and insulin. She is not exercising and is not adherent to low salt diet.  She doesn't check BP at home. Cardiac symptoms none. Patient denies chest pain, chest pressure/discomfort, claudication, dyspnea, near-syncope, palpitations and syncope.  Cardiovascular risk factors: diabetes mellitus, dyslipidemia, hypertension, obesity (BMI >= 30 kg/m2) and sedentary lifestyle. Use of agents associated with hypertension: none. History of target organ damage: none. She complains of bilateral lower muscle aches. She does report history of  prolonged travel. She denies any redness, swelling, or pain. She also complains of bilateral ear pain. Associated symptoms include itching. She denies any history of fluid in the ear. She denies any decreased hearing or fullness in the ear.  Outpatient Medications Prior to Visit  Medication Sig Dispense Refill  . albuterol (PROVENTIL HFA;VENTOLIN HFA) 108 (90 Base) MCG/ACT inhaler Inhale 2 puffs into the lungs every 6 (six) hours as needed for wheezing or shortness of breath. 3 Inhaler 0  . gabapentin (NEURONTIN) 300 MG capsule Take 1 capsule (300 mg total) by mouth 3 (three) times daily. 270 capsule 0  . ibuprofen (ADVIL,MOTRIN) 200 MG tablet Take 400 mg by mouth every 6 (six)  hours as needed for moderate pain.    . Insulin Pen Needle 31G X 5 MM MISC Inject 10 units subcutaneous at bedtime 100 each 0  . atorvastatin (LIPITOR) 40 MG tablet Take 1 tablet (40 mg total) by mouth daily. 90 tablet 0  . Blood Glucose Monitoring Suppl (TRUE METRIX METER) W/DEVICE KIT Use as directed 1 kit 0  . glucose blood (TRUE METRIX BLOOD GLUCOSE TEST) test strip Use as instructed 100 each 12  . insulin NPH-regular Human (HUMULIN 70/30) (70-30) 100 UNIT/ML injection Inject 37 Units into the skin 2 (two) times daily with a meal. 90 mL 3  . metFORMIN (GLUCOPHAGE) 1000 MG tablet TAKE 1 TABLET BY MOUTH 2 TIMES DAILY WITH A MEAL. 180 tablet 0  . TRUEPLUS LANCETS 28G MISC Use as directed 100 each 12   No facility-administered medications prior to visit.   i  ROS Review of Systems  Constitutional: Negative.   HENT: Positive for ear pain.   Eyes: Negative.   Respiratory: Negative.   Cardiovascular: Negative.   Gastrointestinal: Negative.   Musculoskeletal: Positive for myalgias.  Skin: Negative.    Objective:  BP 131/82 (BP Location: Left Arm, Patient Position: Sitting, Cuff Size: Normal)   Pulse 81   Temp 98.3 F (36.8 C) (Oral)   Resp 18   Ht 5' 5"  (1.651 m)   Wt 262 lb 6.4 oz (119 kg)   SpO2 99%   BMI 43.67 kg/m   BP/Weight 04/12/2017 01/26/2017 2/87/8676  Systolic BP 720 947 096  Diastolic BP 82 87 85  Wt. (Lbs) 262.4 - 259  BMI 43.67 - 43.1    Physical Exam  Constitutional: She appears well-developed and well-nourished.  HENT:  Right  Ear: Hearing and tympanic membrane normal. There is tenderness.  Left Ear: Hearing and tympanic membrane normal. There is tenderness.  Eyes: Pupils are equal, round, and reactive to light. Conjunctivae are normal.  Neck: No JVD present.  Cardiovascular: Normal rate, regular rhythm, normal heart sounds and intact distal pulses.   Pulses:      Radial pulses are 2+ on the right side, and 2+ on the left side.       Dorsalis pedis pulses  are 2+ on the right side, and 2+ on the left side.  Pulmonary/Chest: Effort normal and breath sounds normal.  Abdominal: Soft. Bowel sounds are normal. There is no tenderness.  Musculoskeletal: She exhibits no edema or tenderness.  Negative Homans sign.  Skin: Skin is warm and dry. No erythema.  Psychiatric: She has a normal mood and affect.  Nursing note and vitals reviewed.   Assessment & Plan:   1. Uncontrolled type 2 diabetes mellitus with hyperglycemia, with long-term current use of insulin (HCC)  - Glucose (CBG) - HgB A1c - insulin NPH-regular Human (HUMULIN 70/30) (70-30) 100 UNIT/ML injection; Inject 39 Units into the skin 2 (two) times daily with a meal.  Dispense: 90 mL; Refill: 3 - metFORMIN (GLUCOPHAGE XR) 500 MG 24 hr tablet; Take 2 tablets (1,000 mg total) by mouth 2 (two) times daily.  Dispense: 120 tablet; Refill: 3 - glipiZIDE (GLUCOTROL) 5 MG tablet; Take 1 tablet (5 mg total) by mouth daily before breakfast.  Dispense: 30 tablet; Refill: 2 - Blood Glucose Monitoring Suppl (TRUE METRIX METER) w/Device KIT; Use as directed  Dispense: 1 kit; Refill: 0 - glucose blood (TRUE METRIX BLOOD GLUCOSE TEST) test strip; Use as instructed  Dispense: 100 each; Refill: 12 - TRUEPLUS LANCETS 28G MISC; Use as directed  Dispense: 100 each; Refill: 12 - Ambulatory referral to Ophthalmology  2. Essential hypertension  - lisinopril (PRINIVIL,ZESTRIL) 5 MG tablet; Take 1 tablet (5 mg total) by mouth daily.  Dispense: 30 tablet; Refill: 3  3. Myalgia Patient given strict return precautions. Take OTC ibuprofen.    4. Acute otitis externa of both ears, unspecified type  - ofloxacin (FLOXIN) 0.3 % OTIC solution; Place 10 drops into both ears daily. For 7 days  Dispense: 10 mL; Refill: 0  5. Needs flu shot  - Flu Vaccine QUAD 6+ mos PF IM (Fluarix Quad PF)  6. Mixed hyperlipidemia  - atorvastatin (LIPITOR) 40 MG tablet; Take 1 tablet (40 mg total) by mouth daily.  Dispense: 30  tablet; Refill: 3   Meds ordered this encounter  Medications  . insulin NPH-regular Human (HUMULIN 70/30) (70-30) 100 UNIT/ML injection    Sig: Inject 39 Units into the skin 2 (two) times daily with a meal.    Dispense:  90 mL    Refill:  3    Order Specific Question:   Supervising Provider    Answer:   Tresa Garter [id number]  . DISCONTD: metFORMIN (GLUCOPHAGE) 500 MG tablet    Sig: Take 2 tablets (1,000 mg total) by mouth 2 (two) times daily with a meal.    Dispense:  120 tablet    Refill:  3    Order Specific Question:   Supervising Provider    Answer:   Tresa Garter [id number]  . metFORMIN (GLUCOPHAGE XR) 500 MG 24 hr tablet    Sig: Take 2 tablets (1,000 mg total) by mouth 2 (two) times daily.    Dispense:  120 tablet    Refill:  3  Order Specific Question:   Supervising Provider    Answer:   Tresa Garter [id number]  . DISCONTD: ofloxacin (FLOXIN) 0.3 % OTIC solution    Sig: Place 10 drops into both ears daily. For 7 days    Dispense:  5 mL    Refill:  0    Order Specific Question:   Supervising Provider    Answer:   Tresa Garter [id number]  . glipiZIDE (GLUCOTROL) 5 MG tablet    Sig: Take 1 tablet (5 mg total) by mouth daily before breakfast.    Dispense:  30 tablet    Refill:  2    Order Specific Question:   Supervising Provider    Answer:   Tresa Garter [id number]  . Blood Glucose Monitoring Suppl (TRUE METRIX METER) w/Device KIT    Sig: Use as directed    Dispense:  1 kit    Refill:  0    Order Specific Question:   Supervising Provider    Answer:   Tresa Garter [id number]  . glucose blood (TRUE METRIX BLOOD GLUCOSE TEST) test strip    Sig: Use as instructed    Dispense:  100 each    Refill:  12    Order Specific Question:   Supervising Provider    Answer:   Tresa Garter [[id number]]  . TRUEPLUS LANCETS 28G MISC    Sig: Use as directed    Dispense:  100 each    Refill:  12    Order Specific Question:    Supervising Provider    Answer:   Tresa Garter [[id number]]  . lisinopril (PRINIVIL,ZESTRIL) 5 MG tablet    Sig: Take 1 tablet (5 mg total) by mouth daily.    Dispense:  30 tablet    Refill:  3    Order Specific Question:   Supervising Provider    Answer:   Tresa Garter [id number]  . atorvastatin (LIPITOR) 40 MG tablet    Sig: Take 1 tablet (40 mg total) by mouth daily.    Dispense:  30 tablet    Refill:  3    Order Specific Question:   Supervising Provider    Answer:   Tresa Garter [id number]  . ofloxacin (FLOXIN) 0.3 % OTIC solution    Sig: Place 10 drops into both ears daily. For 7 days    Dispense:  10 mL    Refill:  0    Follow-up: Return in about 3 weeks (around 05/03/2017) for DM/HTN with Marzetta Board.   Alfonse Spruce FNP

## 2017-04-12 NOTE — Patient Instructions (Addendum)
Otitis externa (Otitis Externa) La otitis externa es una infeccin del canal auditivo externo. El canal auditivo externo se ubica entre la parte externa del odo y la membrana del tmpano. A veces a la otitis externa se la conoce como "odo del nadador". CUIDADOS EN EL HOGAR  Si le indicaron que se coloque gotas para los odos con antibitico, pngaselas como se lo haya indicado el mdico. No deje de usarlas aunque la afeccin mejore.  Tome los medicamentos de venta libre y los recetados solamente como se lo haya indicado el mdico.  Consulting civil engineer a todas las visitas de control como se lo haya indicado el mdico. Esto es importante. PREVENCIN  Mantenga el odo seco. Use la punta de una toalla para secarse el odo despus de nadar o darse un bao.  Intente no rascarse ni ponerse cosas en el odo. Esto facilita el ingreso de los grmenes al odo.  No nade en lagos, agua sucia o piscinas que pueden no contener la cantidad correcta de una sustancia qumica llamada cloro.  Considere la posibilidad de preparar gotas ticas y ponerse 3 o 4gotas en cada odo despus de nadar. Pregntele al mdico cmo puede preparar las gotas para los odos. SOLICITE AYUDA SI:  Tiene fiebre.  El odo sigue estando rojo, hinchado o le duele despus de 3das.  Sigue teniendo secrecin de pus despus de 3das.  El dolor, la hinchazn o el enrojecimiento empeoran.  Siente un dolor de cabeza muy intenso.  Tiene enrojecimiento, hinchazn, dolor o sensibilidad detrs de Investment banker, operational. Esta informacin no tiene Marine scientist el consejo del mdico. Asegrese de hacerle al mdico cualquier pregunta que tenga. Document Released: 09/12/2011 Document Revised: 07/11/2014 Document Reviewed: 03/30/2015 Elsevier Interactive Patient Education  2018 [employer].     Hipertensin Hypertension El trmino hipertensin es otra forma de denominar a la presin arterial elevada. La presin arterial elevada fuerza al  corazn a trabajar ms para bombear la sangre. Esto puede causar problemas con el paso del [place]. Una lectura de presin arterial est compuesta por 2 nmeros. Hay un nmero superior (sistlico) sobre un nmero inferior (diastlico). Lo ideal es tener la presin arterial por debajo de 120/80. Las decisiones saludables pueden ayudarle a disminuir su presin arterial. Es posible que necesite medicamentos que le ayuden a disminuir su presin arterial si:  Su presin arterial no disminuye mediante decisiones saludables.  Su presin arterial est por encima de 130/80.  Siga estas instrucciones en su casa: Comida y bebida  Si se lo indican, siga el plan de alimentacin de DASH (Dietary Approaches to Stop Hypertension, Maneras de alimentarse para detener la hipertensin). Esta dieta incluye: ? Que la mitad del plato de cada comida sea de frutas y verduras. ? Que un cuarto del plato de cada comida sea de cereales integrales. Los cereales integrales incluyen pasta integral, arroz integral y pan integral. ? Comer y beber productos lcteos con bajo contenido de [place], como leche descremada o yogur bajo en grasas. ? Que un cuarto del plato de cada comida sea de protenas bajas en grasa (magras). Las protenas bajas en grasa incluyen pescado, pollo sin piel, huevos, frijoles y tofu. ? Evitar consumir carne grasa, carne curada y procesada, o pollo con piel. ? Evitar consumir alimentos prehechos o procesados.  Consuma menos de 1500 mg de sal (sodio) por da.  Limite el consumo de alcohol a no ms de 1 medida por da si es mujer y no est Music therapist y a 2 medidas por Training and development officer  si es hombre. Una medida equivale a 12onzas de cerveza, 5onzas de vino o 1onzas de bebidas alcohlicas de alta graduacin. Estilo de vida  Trabaje con su mdico para mantenerse en un peso saludable o para perder peso. Pregntele a su mdico cul es el peso recomendable para usted.  Realice al menos 30 minutos de ejercicio que haga que  se acelere su corazn (ejercicio Arboriculturist) la [employer] de la [place]. Estos pueden incluir caminar, nadar o andar en bicicleta.  Realice al menos 30 minutos de ejercicio que fortalezca sus msculos (ejercicios de resistencia) al menos 3 das a la [place]. Estos pueden incluir levantar pesas o hacer pilates.  No consuma ningn producto que contenga nicotina o tabaco. Esto incluye cigarrillos y cigarrillos electrnicos. Si necesita ayuda para dejar de fumar, consulte al [employer].  Controle su presin arterial en su casa tal como le indic el mdico.  Concurra a todas las visitas de control como se lo haya indicado el mdico. Esto es importante. Medicamentos  [employer] de venta libre y los recetados solamente como se lo haya indicado el mdico. Siga cuidadosamente las indicaciones.  No omita las dosis de medicamentos para la presin arterial. Los medicamentos pierden eficacia si omite dosis. El hecho de omitir las dosis tambin [place] el riesgo de otros problemas.  Pregntele a su mdico a qu efectos secundarios o reacciones a los Careers information officer. Comunquese con un mdico si:  Piensa que tiene [place] reaccin a los medicamentos que est tomando.  Tiene dolores de cabeza frecuentes (recurrentes).  Siente mareos.  Tiene hinchazn en los tobillos.  Tiene problemas de visin. Solicite ayuda de inmediato si:  Siente un dolor de cabeza muy intenso.  Comienza a sentirse confundido.  Se siente dbil o adormecido.  Siente que va a desmayarse.  Siente un dolor muy intenso en: ? El pecho. ? El vientre (abdomen).  Devuelve (vomita) ms de una vez.  Tiene dificultad para respirar. Resumen  El trmino hipertensin es otra forma de denominar a la presin arterial elevada.  Las decisiones saludables pueden ayudarle a disminuir su presin arterial. Si no puede controlar su presin arterial mediante decisiones saludables, es posible que deba tomar  medicamentos. Esta informacin no tiene Marine scientist el consejo del mdico. Asegrese de hacerle al mdico cualquier pregunta que tenga. Document Released: 12/08/2009 Document Revised: 06/01/2016 Document Reviewed: 06/01/2016 Elsevier Interactive Patient Education  [employer].

## 2017-04-12 NOTE — Progress Notes (Signed)
Patient is here for f/up  

## 2017-05-03 ENCOUNTER — Ambulatory Visit: Payer: Self-pay | Admitting: Pharmacist

## 2017-07-07 ENCOUNTER — Ambulatory Visit: Payer: Self-pay | Attending: Family Medicine

## 2017-10-24 ENCOUNTER — Telehealth: Payer: Self-pay | Admitting: Family Medicine

## 2017-10-24 DIAGNOSIS — Z794 Long term (current) use of insulin: Principal | ICD-10-CM

## 2017-10-24 DIAGNOSIS — E1165 Type 2 diabetes mellitus with hyperglycemia: Secondary | ICD-10-CM

## 2017-10-24 MED ORDER — METFORMIN HCL ER 500 MG PO TB24: 1000.0000 mg | ORAL_TABLET | Freq: Two times a day (BID) | ORAL | 0 refills | Status: DC

## 2017-10-24 NOTE — Telephone Encounter (Signed)
Pt last seen in 04/2017. Needs f/u DM appointment prior to refill authorization. Will RF x 1 month with no additional RF's.

## 2017-10-24 NOTE — Telephone Encounter (Signed)
Patient came to the facility requesting a refill for Metformin. Pt. Uses [facility] pharmacy and pt was advised to schedule an appt with the new PCP. Pt. Stated that once she receives her  CAFA she was going to schedule an appt. Please f/u

## 2017-12-04 ENCOUNTER — Ambulatory Visit: Payer: Self-pay | Attending: Family Medicine

## 2018-01-05 ENCOUNTER — Ambulatory Visit: Payer: Medicaid Other | Admitting: Internal Medicine

## 2018-01-10 ENCOUNTER — Other Ambulatory Visit: Payer: Self-pay | Admitting: Family Medicine

## 2018-01-10 DIAGNOSIS — E1165 Type 2 diabetes mellitus with hyperglycemia: Secondary | ICD-10-CM

## 2018-01-10 DIAGNOSIS — Z794 Long term (current) use of insulin: Principal | ICD-10-CM

## 2018-01-24 ENCOUNTER — Ambulatory Visit: Payer: Medicaid Other | Admitting: Nurse Practitioner

## 2018-02-22 ENCOUNTER — Other Ambulatory Visit: Payer: Self-pay | Admitting: Nurse Practitioner

## 2018-02-22 ENCOUNTER — Other Ambulatory Visit: Payer: Self-pay

## 2018-02-22 DIAGNOSIS — E1165 Type 2 diabetes mellitus with hyperglycemia: Secondary | ICD-10-CM

## 2018-02-22 DIAGNOSIS — I10 Essential (primary) hypertension: Secondary | ICD-10-CM

## 2018-02-22 DIAGNOSIS — E782 Mixed hyperlipidemia: Secondary | ICD-10-CM

## 2018-02-22 DIAGNOSIS — Z794 Long term (current) use of insulin: Principal | ICD-10-CM

## 2018-02-28 ENCOUNTER — Other Ambulatory Visit: Payer: Self-pay | Admitting: Nurse Practitioner

## 2018-02-28 DIAGNOSIS — Z794 Long term (current) use of insulin: Principal | ICD-10-CM

## 2018-02-28 DIAGNOSIS — E1165 Type 2 diabetes mellitus with hyperglycemia: Secondary | ICD-10-CM

## 2018-03-06 ENCOUNTER — Other Ambulatory Visit: Payer: Self-pay | Admitting: Family Medicine

## 2018-03-06 DIAGNOSIS — I10 Essential (primary) hypertension: Secondary | ICD-10-CM

## 2018-03-06 DIAGNOSIS — Z794 Long term (current) use of insulin: Secondary | ICD-10-CM

## 2018-03-06 DIAGNOSIS — E1165 Type 2 diabetes mellitus with hyperglycemia: Secondary | ICD-10-CM

## 2018-03-06 MED ORDER — LISINOPRIL 5 MG PO TABS: 5.0000 mg | ORAL_TABLET | Freq: Every day | ORAL | 0 refills | Status: DC

## 2018-03-06 MED ORDER — METFORMIN HCL ER 500 MG PO TB24: 1000.0000 mg | ORAL_TABLET | Freq: Two times a day (BID) | ORAL | 0 refills | Status: DC

## 2018-03-06 NOTE — Telephone Encounter (Signed)
1) Medication(s) Requested (by name): metFORMIN (GLUCOPHAGE-XR) 500 MG 24 hr tablet [[id number]]  lisinopril (PRINIVIL,ZESTRIL) 5 MG tablet [[id number]]  2) Pharmacy of Choice: [facility]  3) Special Requests: Patient has appointment scheduled 03/22/18  Approved medications will be sent to the pharmacy, we will reach out if there is an issue.  Requests made after 3pm may not be addressed until the following business day!  If a patient is unsure of the name of the medication(s) please note and ask patient to call back when they are able to provide all info, do not send to responsible party until all information is available!

## 2018-03-22 ENCOUNTER — Ambulatory Visit: Payer: Self-pay | Attending: Family Medicine | Admitting: Family Medicine

## 2018-03-22 ENCOUNTER — Other Ambulatory Visit: Payer: Self-pay

## 2018-03-22 VITALS — BP 134/84 | HR 80 | Temp 98.5°F | Resp 18 | Ht 66.0 in | Wt 288.0 lb

## 2018-03-22 DIAGNOSIS — Z794 Long term (current) use of insulin: Secondary | ICD-10-CM

## 2018-03-22 DIAGNOSIS — F17210 Nicotine dependence, cigarettes, uncomplicated: Secondary | ICD-10-CM | POA: Insufficient documentation

## 2018-03-22 DIAGNOSIS — Z8249 Family history of ischemic heart disease and other diseases of the circulatory system: Secondary | ICD-10-CM | POA: Insufficient documentation

## 2018-03-22 DIAGNOSIS — Z79899 Other long term (current) drug therapy: Secondary | ICD-10-CM

## 2018-03-22 DIAGNOSIS — E1140 Type 2 diabetes mellitus with diabetic neuropathy, unspecified: Secondary | ICD-10-CM | POA: Insufficient documentation

## 2018-03-22 DIAGNOSIS — E1165 Type 2 diabetes mellitus with hyperglycemia: Secondary | ICD-10-CM

## 2018-03-22 DIAGNOSIS — K219 Gastro-esophageal reflux disease without esophagitis: Secondary | ICD-10-CM

## 2018-03-22 DIAGNOSIS — E782 Mixed hyperlipidemia: Secondary | ICD-10-CM

## 2018-03-22 DIAGNOSIS — I10 Essential (primary) hypertension: Secondary | ICD-10-CM

## 2018-03-22 DIAGNOSIS — Z23 Encounter for immunization: Secondary | ICD-10-CM

## 2018-03-22 DIAGNOSIS — Z6841 Body Mass Index (BMI) 40.0 and over, adult: Secondary | ICD-10-CM | POA: Insufficient documentation

## 2018-03-22 DIAGNOSIS — R05 Cough: Secondary | ICD-10-CM

## 2018-03-22 DIAGNOSIS — R053 Chronic cough: Secondary | ICD-10-CM

## 2018-03-22 DIAGNOSIS — E669 Obesity, unspecified: Secondary | ICD-10-CM | POA: Insufficient documentation

## 2018-03-22 LAB — POCT GLYCOSYLATED HEMOGLOBIN (HGB A1C)
HbA1c POC (<> result, manual entry): 10.9 %
HbA1c, POC (controlled diabetic range): 10.9 % — AB (ref 0.0–7.0)
HbA1c, POC (prediabetic range): 10.9 % — AB (ref 5.7–6.4)
Hemoglobin A1C: 10.9 % — AB (ref 4.0–5.6)

## 2018-03-22 LAB — GLUCOSE, POCT (MANUAL RESULT ENTRY): POC Glucose: 219 mg/dL — AB (ref 70–99)

## 2018-03-22 MED ORDER — TRUEPLUS LANCETS 28G MISC: 12 refills | Status: DC

## 2018-03-22 MED ORDER — GLUCOSE BLOOD VI STRP: ORAL_STRIP | 12 refills | Status: DC

## 2018-03-22 MED ORDER — GABAPENTIN 300 MG PO CAPS: 300.0000 mg | ORAL_CAPSULE | Freq: Three times a day (TID) | ORAL | 6 refills | Status: DC

## 2018-03-22 MED ORDER — PANTOPRAZOLE SODIUM 40 MG PO TBEC: 40.0000 mg | DELAYED_RELEASE_TABLET | Freq: Every day | ORAL | 3 refills | Status: DC

## 2018-03-22 MED ORDER — METFORMIN HCL ER 500 MG PO TB24: 1000.0000 mg | ORAL_TABLET | Freq: Two times a day (BID) | ORAL | 6 refills | Status: DC

## 2018-03-22 MED ORDER — GLIPIZIDE 5 MG PO TABS: 5.0000 mg | ORAL_TABLET | Freq: Every day | ORAL | 2 refills | Status: DC

## 2018-03-22 MED ORDER — INSULIN GLARGINE 100 UNIT/ML SOLOSTAR PEN: 50.0000 [IU] | PEN_INJECTOR | Freq: Every day | SUBCUTANEOUS | 11 refills | Status: DC

## 2018-03-22 MED ORDER — ATORVASTATIN CALCIUM 40 MG PO TABS: 40.0000 mg | ORAL_TABLET | Freq: Every day | ORAL | 6 refills | Status: DC

## 2018-03-22 NOTE — Progress Notes (Signed)
Subjective:    Patient ID: Julie Robertson, female    DOB: Jul 20, 1975, 42 y.o.   MRN: [id number]  HPI 42 yo female who returns for follow-up of chronic medical issues and continued medical management.  Patient reports that she has been out of her medications for about 2.  Patient was last seen at this office on April 12, 2017 for her diabetes and hypertension.  Patient has not been monitoring her blood sugars.  Patient has history of uncontrolled type 2 diabetes, acid reflux, hypertension, hyperlipidemia and tobacco use.  Patient reports that he continues to smoke about 1 pack/day of cigarettes.  Patient states that she has had a recurrent call since June or July.  The cough is generally nonproductive and tends to occur in the morning and at night.  Patient is not currently taking lisinopril so she does not think that this is contributing to her cough.  Patient has been prescribed an albuterol inhaler in the past and she states that this sometimes but not always helps with the cough.  Patient does not really feel as if she is having any shortness of breath.  Patient states that she is not interested in being on insulin for control of her diabetes.  Patient prefers oral medication if possible.  Patient reports that she needs refills of all of her prescribed medications.  Patient denies chest pain or palpitations.  Patient has sometimes felt as if she is having some numbness in her feet but this goes away.  Patient denies any chest pain or palpitations.  No abdominal pain.  Patient with occasional urinary frequency.  Patient denies dysuria. Past Medical History:  Diagnosis Date  . Diabetes mellitus without complication ([place])   . Diabetic neuropathy ([place]) 12/2013   vs carpal tunnell.  numbness tingling in right fingers. rx with Gabapentin.  Marland Kitchen Dyslipidemia 08/2009  . Gall stones 08/2009  . Obesity    BMI 41, 250# 03/2015   Past Surgical History:  Procedure Laterality Date  . CHOLECYSTECTOMY  N/A 03/10/2015   Procedure: LAPAROSCOPIC CHOLECYSTECTOMY WITH INTRAOPERATIVE CHOLANGIOGRAM;  Surgeon: Georganna Skeans, MD;  Location: [facility];  Service: General;  Laterality: N/A;  . ERCP N/A 03/11/2015   Procedure: ENDOSCOPIC RETROGRADE CHOLANGIOPANCREATOGRAPHY (ERCP);  Surgeon: Milus Banister, MD;  Location: [facility];  Service: Endoscopy;  Laterality: N/A;  . TUBAL LIGATION     Family History  Problem Relation Age of Onset  . Diabetes Mother   . Heart disease Mother   . Cancer Mother   . Cancer Father   . Diabetes Father   . Heart disease Father    Social History   Tobacco Use  . Smoking status: Current Every Day Smoker    Packs/day: 1.50    Years: 4.00    Pack years: 6.00    Types: Cigarettes  . Smokeless tobacco: Never Used  Substance Use Topics  . Alcohol use: Yes    Comment: socially  . Drug use: No  No Known Allergies   Review of Systems  Constitutional: Positive for fatigue. Negative for chills and fever.  Respiratory: Positive for cough. Negative for shortness of breath.   Cardiovascular: Negative for chest pain, palpitations and leg swelling.  Gastrointestinal: Negative for abdominal pain and blood in stool.       Patient with some burping/belching and backwash of bad tasting fluid into her mouth  Genitourinary: Positive for frequency. Negative for dysuria.  Musculoskeletal: Negative for gait problem and joint swelling.  Neurological: Negative for  dizziness and headaches.       Objective:   Physical Exam BP 134/84   Pulse 80   Temp 98.5 F (36.9 C) (Oral)   Resp 18   Ht 5\' 6"  (1.676 m)   Wt 288 lb (130.6 kg)   SpO2 96%   BMI 46.48 kg/m  Vital sign's and nurse's note reviewed General- well-nourished, well-developed, obese older female in no acute distress ENT-TMs gray, mild edema of the nasal turbinates, mild posterior pharynx erythema Neck-supple, no lymphadenopathy, no adenopathy, no carotid bruit Lungs-clear to auscultation  bilaterally Cardiovascular-regular rate and rhythm Abdomen-soft, nontender Back-no CVA tenderness Extremities-no edema Foot exam- some thickening of the toenails, no active skin breakdown on the feet with the exception of some mild cracking of the skin of the heels and drying/callus which can lead to infection on the heels.  1+ dorsalis pedis and posterior tibial pulses.  Normal monofilament exam with exception of absent monofilament sensation on the heels     Assessment & Plan:  1. Uncontrolled type 2 diabetes mellitus with hyperglycemia, with long-term current use of insulin ([place]) Patient's hemoglobin A1c at today's visit is 10.9 with a glucose of 219 random.  Patient reports that she has been out of her medication for about 2 months.  Compliance with medications was stressed as well as monitoring.  Patient was provided with refill of glipizide and metformin.  Patient also given refill of Neurontin.  Patient does have abnormal monofilament exam.  Foot care was discussed with the patient at today's visit.  Patient is encouraged to do regular foot care as well as moisturizing heels to prevent cracking of the heels which can lead to infection/ulceration.  Patient states that she is not able to do twice daily insulins.  Patient admitted that she is afraid to stick herself with a longer needle that she uses when she has to use a syringe and vial of insulin.  Patient reports that she has used an insulin pen in the past with good success.  I spoke with the pharmacist and patient should be able to obtain Lantus Solostar pen.  Patient reports that she has used 50 units of Lantus in the past with good success.  Patient is aware that this is a long acting insulin and she must eat 3 meals daily and not skip meals and monitor her blood sugars and report in my clinic episodes so that her doses of Lantus and other diabetic medications can be adjusted as needed.  Patient will also be referred for diabetic eye exam -  HgB A1c - Glucose (CBG) - Comprehensive metabolic panel - gabapentin (NEURONTIN) 300 MG capsule; Take 1 capsule (300 mg total) by mouth 3 (three) times daily.  Dispense: 90 capsule; Refill: 6 - glipiZIDE (GLUCOTROL) 5 MG tablet; Take 1 tablet (5 mg total) by mouth daily before breakfast.  Dispense: 30 tablet; Refill: 2 - metFORMIN (GLUCOPHAGE-XR) 500 MG 24 hr tablet; Take 2 tablets (1,000 mg total) by mouth 2 (two) times daily.  Dispense: 60 tablet; Refill: 6 - TRUEPLUS LANCETS 28G MISC; Use as directed  Dispense: 100 each; Refill: 12 - glucose blood (TRUE METRIX BLOOD GLUCOSE TEST) test strip; Use as instructed  Dispense: 100 each; Refill: 12 - Insulin Glargine (LANTUS SOLOSTAR) 100 UNIT/ML Solostar Pen; Inject 50 Units into the skin daily.  Dispense: 5 pen; Refill: 11  2. Essential hypertension Patient provided with refill of blood pressure medication at today's visit.  Patient may need to be changed from lisinopril if  her cough continues however patient has had cough but has been out of her lisinopril for about 2 months  3. Mixed hyperlipidemia Patient with history of mixed hyperlipidemia and patient was provided with refill of atorvastatin and patient is encouraged to remain compliant as well as follow a low-fat diet and patient will have  lipid panel at today's visit - TSH - Lipid panel - atorvastatin (LIPITOR) 40 MG tablet; Take 1 tablet (40 mg total) by mouth daily.  Dispense: 30 tablet; Refill: 6  4. Chronic cough Patient with tobacco use and reports chronic cough.  Patient will be placed back on her reflux medications as reflux may be contributing to her cough but patient does not have chest x-ray.  Patient states that she is not ready to try smoking cessation. - DG Chest 2 View; Future  5. Encounter for long-term (current) use of medications Patient will have CMP and CBC in follow-up of long-term use of medications - Comprehensive metabolic panel - CBC with Differential  6.  Gastroesophageal reflux disease, esophagitis presence not specified Patient is being placed on Protonix to help with acid reflux and to see if this also decreases or stops patient's chronic cough - pantoprazole (PROTONIX) 40 MG tablet; Take 1 tablet (40 mg total) by mouth daily.  Dispense: 30 tablet; Refill: 3  7. Need for immunization against influenza Patient was offered and agreed to receive influenza immunization at today's visit.  Educational handout regarding influenza immunization was also provided - Flu Vaccine QUAD 36+ mos IM  An After Visit Summary was printed and given to the patient.  Return in about 4 weeks (around 04/19/2018).

## 2018-03-22 NOTE — Progress Notes (Signed)
Pain:0 Flu shot: yes All other medications have run out needs all refilled [facility]

## 2018-03-23 ENCOUNTER — Ambulatory Visit: Payer: Self-pay | Attending: Family Medicine | Admitting: Pharmacist

## 2018-03-23 ENCOUNTER — Encounter: Payer: Self-pay | Admitting: Pharmacist

## 2018-03-23 ENCOUNTER — Other Ambulatory Visit: Payer: Self-pay

## 2018-03-23 DIAGNOSIS — Z79899 Other long term (current) drug therapy: Secondary | ICD-10-CM | POA: Insufficient documentation

## 2018-03-23 DIAGNOSIS — E782 Mixed hyperlipidemia: Secondary | ICD-10-CM

## 2018-03-23 LAB — COMPREHENSIVE METABOLIC PANEL WITH GFR
ALT: 109 IU/L — ABNORMAL HIGH (ref 0–32)
AST: 113 IU/L — ABNORMAL HIGH (ref 0–40)
Albumin/Globulin Ratio: 1.4 (ref 1.2–2.2)
Albumin: 4 g/dL (ref 3.5–5.5)
Alkaline Phosphatase: 92 IU/L (ref 39–117)
BUN/Creatinine Ratio: 13 (ref 9–23)
BUN: 9 mg/dL (ref 6–24)
Bilirubin Total: 0.2 mg/dL (ref 0.0–1.2)
CO2: 22 mmol/L (ref 20–29)
Calcium: 8.8 mg/dL (ref 8.7–10.2)
Chloride: 103 mmol/L (ref 96–106)
Creatinine, Ser: 0.68 mg/dL (ref 0.57–1.00)
GFR calc Af Amer: 124 mL/min/1.73
GFR calc non Af Amer: 107 mL/min/1.73
Globulin, Total: 2.9 g/dL (ref 1.5–4.5)
Glucose: 207 mg/dL — ABNORMAL HIGH (ref 65–99)
Potassium: 4.3 mmol/L (ref 3.5–5.2)
Sodium: 140 mmol/L (ref 134–144)
Total Protein: 6.9 g/dL (ref 6.0–8.5)

## 2018-03-23 LAB — CBC WITH DIFFERENTIAL/PLATELET
Basophils Absolute: 0 x10E3/uL (ref 0.0–0.2)
Basos: 0 %
EOS (ABSOLUTE): 0.3 x10E3/uL (ref 0.0–0.4)
Eos: 5 %
Hematocrit: 37.7 % (ref 34.0–46.6)
Hemoglobin: 10.6 g/dL — ABNORMAL LOW (ref 11.1–15.9)
Immature Grans (Abs): 0 x10E3/uL (ref 0.0–0.1)
Immature Granulocytes: 0 %
Lymphocytes Absolute: 2.6 x10E3/uL (ref 0.7–3.1)
Lymphs: 40 %
MCH: 19.4 pg — ABNORMAL LOW (ref 26.6–33.0)
MCHC: 28.1 g/dL — ABNORMAL LOW (ref 31.5–35.7)
MCV: 69 fL — ABNORMAL LOW (ref 79–97)
Monocytes Absolute: 0.5 x10E3/uL (ref 0.1–0.9)
Monocytes: 7 %
Neutrophils Absolute: 3.1 x10E3/uL (ref 1.4–7.0)
Neutrophils: 48 %
Platelets: 397 x10E3/uL (ref 150–450)
RBC: 5.47 x10E6/uL — ABNORMAL HIGH (ref 3.77–5.28)
RDW: 17 % — ABNORMAL HIGH (ref 12.3–15.4)
WBC: 6.5 x10E3/uL (ref 3.4–10.8)

## 2018-03-23 LAB — LIPID PANEL
Chol/HDL Ratio: 4.9 ratio — ABNORMAL HIGH (ref 0.0–4.4)
Cholesterol, Total: 172 mg/dL (ref 100–199)
HDL: 35 mg/dL — ABNORMAL LOW
LDL Calculated: 103 mg/dL — ABNORMAL HIGH (ref 0–99)
Triglycerides: 168 mg/dL — ABNORMAL HIGH (ref 0–149)
VLDL Cholesterol Cal: 34 mg/dL (ref 5–40)

## 2018-03-23 LAB — TSH: TSH: 3.09 u[IU]/mL (ref 0.450–4.500)

## 2018-03-23 NOTE — Progress Notes (Signed)
Patient was educated on the use of the Lantus pen. Reviewed necessary supplies and operation of the pen. Also reviewed goal blood glucose levels. Patient was able to demonstrate use. All questions and concerns were addressed.  

## 2018-03-28 ENCOUNTER — Ambulatory Visit (HOSPITAL_COMMUNITY)
Admission: RE | Admit: 2018-03-28 | Discharge: 2018-03-28 | Disposition: A | Discharge status: Home / Self Care | Payer: Medicaid Other | Source: Ambulatory Visit | Attending: Family Medicine | Admitting: Family Medicine

## 2018-03-28 DIAGNOSIS — R05 Cough: Secondary | ICD-10-CM | POA: Insufficient documentation

## 2018-03-28 DIAGNOSIS — R053 Chronic cough: Secondary | ICD-10-CM

## 2018-03-30 ENCOUNTER — Telehealth: Payer: Self-pay

## 2018-03-30 NOTE — Telephone Encounter (Signed)
[place] interpreters victor  Id#  [phone] contacted pt to go over xray results pt is aware and doesn't have any questions or concerns

## 2018-04-19 ENCOUNTER — Ambulatory Visit: Payer: Self-pay | Attending: Family Medicine | Admitting: Family Medicine

## 2018-04-19 ENCOUNTER — Other Ambulatory Visit: Payer: Self-pay

## 2018-04-19 ENCOUNTER — Encounter: Payer: Self-pay | Admitting: Family Medicine

## 2018-04-19 VITALS — BP 134/87 | HR 92 | Temp 98.3°F | Resp 18 | Ht 65.0 in | Wt 261.2 lb

## 2018-04-19 DIAGNOSIS — E782 Mixed hyperlipidemia: Secondary | ICD-10-CM

## 2018-04-19 DIAGNOSIS — Z794 Long term (current) use of insulin: Secondary | ICD-10-CM

## 2018-04-19 DIAGNOSIS — Z9049 Acquired absence of other specified parts of digestive tract: Secondary | ICD-10-CM | POA: Insufficient documentation

## 2018-04-19 DIAGNOSIS — Z833 Family history of diabetes mellitus: Secondary | ICD-10-CM | POA: Insufficient documentation

## 2018-04-19 DIAGNOSIS — Z6841 Body Mass Index (BMI) 40.0 and over, adult: Secondary | ICD-10-CM

## 2018-04-19 DIAGNOSIS — R05 Cough: Secondary | ICD-10-CM

## 2018-04-19 DIAGNOSIS — R059 Cough, unspecified: Secondary | ICD-10-CM

## 2018-04-19 DIAGNOSIS — Z8709 Personal history of other diseases of the respiratory system: Secondary | ICD-10-CM

## 2018-04-19 DIAGNOSIS — Z9889 Other specified postprocedural states: Secondary | ICD-10-CM | POA: Insufficient documentation

## 2018-04-19 DIAGNOSIS — D135 Benign neoplasm of extrahepatic bile ducts: Secondary | ICD-10-CM

## 2018-04-19 DIAGNOSIS — Z8249 Family history of ischemic heart disease and other diseases of the circulatory system: Secondary | ICD-10-CM | POA: Insufficient documentation

## 2018-04-19 DIAGNOSIS — F17210 Nicotine dependence, cigarettes, uncomplicated: Secondary | ICD-10-CM | POA: Insufficient documentation

## 2018-04-19 DIAGNOSIS — Z9851 Tubal ligation status: Secondary | ICD-10-CM | POA: Insufficient documentation

## 2018-04-19 DIAGNOSIS — M549 Dorsalgia, unspecified: Secondary | ICD-10-CM

## 2018-04-19 DIAGNOSIS — K219 Gastro-esophageal reflux disease without esophagitis: Secondary | ICD-10-CM

## 2018-04-19 DIAGNOSIS — E1165 Type 2 diabetes mellitus with hyperglycemia: Secondary | ICD-10-CM

## 2018-04-19 DIAGNOSIS — Z86018 Personal history of other benign neoplasm: Secondary | ICD-10-CM | POA: Insufficient documentation

## 2018-04-19 LAB — POCT URINALYSIS DIP (CLINITEK)
Bilirubin, UA: NEGATIVE
Blood, UA: NEGATIVE
Glucose, UA: 250 mg/dL — AB
Ketones, POC UA: NEGATIVE mg/dL
Leukocytes, UA: NEGATIVE
Nitrite, UA: NEGATIVE
POC,PROTEIN,UA: NEGATIVE
Spec Grav, UA: <=1.005 — AB
Urobilinogen, UA: 0.2 U/dL
pH, UA: 5.5

## 2018-04-19 LAB — GLUCOSE, POCT (MANUAL RESULT ENTRY): POC Glucose: 253 mg/dL — AB (ref 70–99)

## 2018-04-19 MED ORDER — GLIPIZIDE 5 MG PO TABS: 5.0000 mg | ORAL_TABLET | Freq: Every day | ORAL | 6 refills | Status: DC

## 2018-04-19 MED ORDER — ALBUTEROL SULFATE HFA 108 (90 BASE) MCG/ACT IN AERS: 2.0000 | INHALATION_SPRAY | Freq: Four times a day (QID) | RESPIRATORY_TRACT | 3 refills | Status: DC | PRN

## 2018-04-19 MED ORDER — ATORVASTATIN CALCIUM 40 MG PO TABS: 40.0000 mg | ORAL_TABLET | Freq: Every day | ORAL | 6 refills | Status: DC

## 2018-04-19 MED ORDER — PANTOPRAZOLE SODIUM 40 MG PO TBEC: 40.0000 mg | DELAYED_RELEASE_TABLET | Freq: Every day | ORAL | 3 refills | Status: DC

## 2018-04-19 MED ORDER — METFORMIN HCL ER 500 MG PO TB24: 1000.0000 mg | ORAL_TABLET | Freq: Two times a day (BID) | ORAL | 6 refills | Status: DC

## 2018-04-19 MED ORDER — LOSARTAN POTASSIUM 25 MG PO TABS: 25.0000 mg | ORAL_TABLET | Freq: Every day | ORAL | 11 refills | Status: DC

## 2018-04-19 MED ORDER — INSULIN GLARGINE 100 UNIT/ML SOLOSTAR PEN: 50.0000 [IU] | PEN_INJECTOR | Freq: Every day | SUBCUTANEOUS | 11 refills | Status: DC

## 2018-04-19 NOTE — Progress Notes (Signed)
Patient requested a refill on all medications she is currently taking and inhaler.   Patient complained of a cough she has had since July and cant get rid of   Patient also stated she doesn't understand why she is taking the lisinopril.

## 2018-04-19 NOTE — Patient Instructions (Signed)
Tos en los adultos (Cough, Adult) La tos ayuda a limpiar la garganta y los pulmones. La tos puede durar solo 2 o 3semanas (aguda) o ms de 8semanas (crnica). Las causas de la tos son [place]. Puede ser el signo de [place] enfermedad o de otro trastorno. CUIDADOS EN EL HOGAR  Est atento a cualquier cambio en la tos.  Tome los medicamentos solamente como se lo haya indicado el mdico. ? Si le recetaron un antibitico, tmelo como se lo haya indicado el mdico. No deje de tomarlo aunque comience a sentirse mejor. ? Hable con el mdico antes de probar un medicamento para la tos.  Beba suficiente lquido para mantener el pis (orina) claro o de color amarillo plido.  Si el aire est seco, use un vaporizador o un humidificador con vapor fro en su casa.  Mantngase alejado de las cosas que lo hacen toser en el trabajo o en su casa.  Si la tos aumenta durante la noche, haga la prueba de usar almohadas adicionales para Theatre manager la cabeza ms elevada mientras duerme.  No fume e intente no estar cerca de humo. Si necesita ayuda para dejar de fumar, consulte al mdico.  No consuma cafena.  No beba alcohol.  Descanse todo lo que sea necesario. SOLICITE AYUDA SI:  Le aparecen problemas (sntomas) nuevos.  Expectora un lquido amarillento (pus) cuando tose.  La tos no mejora despus de 2 o 3semanas, o empeora.  Los medicamentos no [employer] tos, y no Programmer, applications.  Siente un dolor que se vuelve ms intenso o que no se [employer].  Tiene fiebre.  Est bajando de peso y no sabe por qu.  Tiene transpiracin nocturna. SOLICITE AYUDA DE INMEDIATO SI:  Tose y escupe sangre.  Tiene dificultad para respirar.  Los latidos cardacos son muy rpidos. Esta informacin no tiene Marine scientist el consejo del mdico. Asegrese de hacerle al mdico cualquier pregunta que tenga. Document Released: 03/03/2011 Document Revised: 03/11/2015 Document Reviewed:  08/27/2014 Elsevier Interactive Patient Education  2018 [place] de alimentos para pacientes con reflujo gastroesofgico - Adultos (Food Choices for Gastroesophageal Reflux Disease, Adult) Cuando se tiene reflujo gastroesofgico (ERGE), los alimentos que se ingieren y los hbitos de alimentacin son Theatre stage manager. Elegir los alimentos adecuados puede ayudar a [employer]. [place]?  Elija las frutas, los vegetales, los cereales integrales y los productos lcteos con bajo contenido de [place].  [place], de pescado y de ave con bajo contenido de grasas.  Limite las grasas, como los [place], los aderezos para [place], la [place], los frutos secos y Publishing copy.  Lleve un registro de alimentos. Esto ayuda a identificar los alimentos que ocasionan sntomas.  Evite los alimentos que le ocasionen sntomas. Pueden ser distintos para cada persona.  Haga comidas pequeas durante Psychiatrist de 3 comidas abundantes.  Coma lentamente, en un lugar donde est distendido.  Limite el consumo de alimentos fritos.  Cocine los alimentos utilizando mtodos que no sean la fritura.  Evite el consumo alcohol.  Evite beber grandes cantidades de lquidos con las comidas.  Evite agacharse o recostarse hasta despus de 2 o 3horas de haber comido.  QU ALIMENTOS NO SE RECOMIENDAN? Estos son algunos alimentos y bebidas que pueden empeorar los sntomas: Astronomer. Jugo de tomate. Salsa de tomate y espagueti. Ajes. [place] y [place]. Rbano picante. Frutas [place], pomelos y limn (fruta y [place]). Carnes Carnes de [place],  de pescado y de ave con gran contenido de grasas. Esto incluye los perros calientes, las [place], el [place], la salchicha, el salame y el tocino. Lcteos [place] entera y [place]. R[employer] Aid. Crema. [place]. Hel[place]. Queso crema. Bebidas T o caf. Bebidas gaseosas o bebidas  energizantes. Condimentos Salsa picante. Salsa barbacoa. Dulces/postres Chocolate y cacao. [place]. Menta y mentol. Grasas y Albertson's. Esto incluye las papas fritas. Otros [place]. Especias picantes. Esto incluye la pimienta negra, la pimienta blanca, la pimienta roja, la pimienta de cayena, el curry en polvo, los clavos de [place], el jengibre y el [place] en polvo. Esta no es [employer] de los alimentos y las bebidas que se Higher education careers adviser. Comunquese con el nutricionista para recibir ms informacin. Esta informacin no tiene Marine scientist el consejo del mdico. Asegrese de hacerle al mdico cualquier pregunta que tenga. Document Released: 12/20/2011 Document Revised: 07/11/2014 Document Reviewed: 04/24/2013 Elsevier Interactive Patient Education  2017 [employer].

## 2018-04-19 NOTE — Progress Notes (Signed)
Subjective:    Patient ID: Julie Robertson, female    DOB: 10/20/1975, 42 y.o.   MRN: [id number]  Due to a language barrier, a video interpreter service was used at today's visit  HPI 42 year old female seen in follow-up of poorly controlled diabetes and recurrent cough.  Since her last visit, patient did meet with the clinical pharmacist regarding use of insulin/Lantus.  Patient reports that she is now using the Lantus on a daily basis and she reports that she feels better.  Patient however is not monitoring her home blood sugars.  Patient states that she continues to have a dry cough that has occurred since late June/early July.  Patient denies any fever or chills, patient denies postnasal drainage.  Patient has had a recurrent sore throat.  Patient does report that since she started the acid reflux medicine prescribed at her last visit that she does have less acid reflux symptoms but still occasionally has a burning sensation in the back of her throat/sore throat.  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  Patient states that she tumor was discovered when she was in the hospital to have her gallbladder removed.  Patient states that she had this done in [place].  Patient states that recently, over the past few weeks, when she coughs, she feels a strange sensation in her mid abdomen and patient is concerned that she may have had a recurrence of the prior tumor.  Patient states that she was told that the tumor was benign.      Patient also states that she is not sure why she is on lisinopril.  Patient would also like a refill of all of her medications at today's visit.  Patient also would like to have an albuterol inhaler to use as needed for cough. Past Medical History:  Diagnosis Date  . Diabetes mellitus without complication ([place])   . Diabetic neuropathy ([place]) 12/2013   vs carpal tunnell.  numbness tingling in right fingers. rx with Gabapentin.  Marland Kitchen Dyslipidemia 08/2009  . Gall stones 08/2009  . Obesity    BMI 41, 250# 03/2015   Past Surgical History:  Procedure Laterality Date  . CHOLECYSTECTOMY N/A 03/10/2015   Procedure: LAPAROSCOPIC CHOLECYSTECTOMY WITH INTRAOPERATIVE CHOLANGIOGRAM;  Surgeon: Georganna Skeans, MD;  Location: [facility];  Service: General;  Laterality: N/A;  . ERCP N/A 03/11/2015   Procedure: ENDOSCOPIC RETROGRADE CHOLANGIOPANCREATOGRAPHY (ERCP);  Surgeon: Milus Banister, MD;  Location: [facility];  Service: Endoscopy;  Laterality: N/A;  . TUBAL LIGATION     Family History  Problem Relation Age of Onset  . Diabetes Mother   . Heart disease Mother   . Cancer Mother   . Cancer Father   . Diabetes Father   . Heart disease Father    Social History   Tobacco Use  . Smoking status: Current Every Day Smoker    Packs/day: 1.50    Years: 4.00    Pack years: 6.00    Types: Cigarettes  . Smokeless tobacco: Never Used  Substance Use Topics  . Alcohol use: Yes    Comment: socially  . Drug use: No  No Known Allergies    Review of Systems  Constitutional: Positive for fatigue. Negative for chills, diaphoresis and fever.  HENT: Positive for sore throat. Negative for congestion, trouble swallowing and voice change.   Respiratory: Positive for  cough. Negative for shortness of breath.   Gastrointestinal: Positive for abdominal pain (abnormal sensation in her stomach with coughing). Negative for nausea.  Endocrine: Negative for polydipsia, polyphagia and polyuria.  Genitourinary: Negative for dysuria and frequency.  Musculoskeletal: Positive for back pain. Negative for gait problem and joint swelling.  Neurological: Negative for dizziness and headaches.       Objective:   Physical Exam BP 134/87   Pulse 92   Temp 98.3 F (36.8 C) (Oral)   Resp 18   Ht 5\' 5"  (1.651 m)   Wt 261 lb 3.2 oz (118.5 kg)   LMP 03/28/2018   SpO2 95%   BMI  43.47 kg/m  nurses notes and vital signs reviewed General-well-nourished, well-developed overweight older female in no acute distress but patient with occasional recurrent dry cough while in the exam room. Neck-supple, no lymphadenopathy, no thyromegaly. Oropharynx-patient with posterior pharynx/tonsillar arch erythema Lungs-clear to auscultation bilaterally Cardiovascular-regular rate and rhythm Abdomen-soft, no epigastric tenderness but patient with some mild suprapubic discomfort to palpation Back-patient with mild bilateral CVA tenderness right greater than left Extremities-no edema        Assessment & Plan:  1. Uncontrolled type 2 diabetes mellitus with hyperglycemia, with long-term current use of insulin ([place]) Patient with a random glucose at today's visit of 253.  Patient states that she has started the Lantus that was prescribed at the last visit but patient is not monitoring her home blood sugars.  Patient has been encouraged to monitor her home blood sugars.  Patient is also given refill of her Glucophage and Glucotrol.  Discussed with the patient via the interpreter that lisinopril had been prescribed to help protect her kidneys from the effects of diabetes.  Patient will be changed to Cozaar 25 mg and lisinopril discontinued secondary to patient's complaint of recurrent cough but at her last visit, patient stated that the cough started prior to her initial prescription for lisinopril.  Patient will also have urine microalbumin to assess her renal function. - Glucose (CBG) - Microalbumin/Creatinine Ratio, Urine - losartan (COZAAR) 25 MG tablet; Take 1 tablet (25 mg total) by mouth daily.  Dispense: 30 tablet; Refill: 11 - glipiZIDE (GLUCOTROL) 5 MG tablet; Take 1 tablet (5 mg total) by mouth daily before breakfast.  Dispense: 30 tablet; Refill: 6 - Insulin Glargine (LANTUS SOLOSTAR) 100 UNIT/ML Solostar Pen; Inject 50 Units into the skin daily.  Dispense: 5 pen; Refill: 11 -  metFORMIN (GLUCOPHAGE-XR) 500 MG 24 hr tablet; Take 2 tablets (1,000 mg total) by mouth 2 (two) times daily.  Dispense: 60 tablet; Refill: 6  2. Cough Patient with recurrent dry cough.  Patient reports that taking reflux medication did not decrease her cough but did help with her acid reflux symptoms.  Patient request a prescription for albuterol which was provided at today's visit.  Patient is also being referred to pulmonology for further evaluation and treatment.  Patient's lisinopril is being discontinued and she will be placed on losartan. Recent CXR was normal. Smoking cessation encouraged. - Ambulatory referral to Pulmonology - albuterol (PROVENTIL HFA;VENTOLIN HFA) 108 (90 Base) MCG/ACT inhaler; Inhale 2 puffs into the lungs every 6 (six) hours as needed for wheezing or shortness of breath.  Dispense: 1 Inhaler; Refill: 3  3. Mid back pain Patient with mid back pain on examination as well as mild suprapubic discomfort.  Patient had urinalysis done at today's visit which was negative for blood, leukocytes or nitrites.  Patient should remain well-hydrated.  Patient may take  over-the-counter pain medication as needed for back pain but should avoid use of NSAIDs due to her acid reflux. 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  Patient also with continued issues with acid reflux as well as sore throat related to her acid reflux.  Patient will be referred to GI for further evaluation and treatment.  Handout provided regarding food choices for acid reflux as part of her AVS - Ambulatory referral to Gastroenterology  5. Gastroesophageal reflux disease, esophagitis presence not specified Patient will continue use of the pantoprazole for treatment of acid reflux but the patient will also be referred to gastroenterology for further evaluation and treatment - Ambulatory referral to Gastroenterology - pantoprazole  (PROTONIX) 40 MG tablet; Take 1 tablet (40 mg total) by mouth daily.  Dispense: 30 tablet; Refill: 3   6. Mixed hyperlipidemia Patient provided with refill of atorvastatin for treatment of hyperlipidemia and patient encouraged to follow a low-fat diet - atorvastatin (LIPITOR) 40 MG tablet; Take 1 tablet (40 mg total) by mouth daily.  Dispense: 30 tablet; Refill: 6  7. Morbid Obesity with BMI of 40-44.9, adult Low-fat diet and regular exercise with goal of weight loss encouraged  An After Visit Summary was printed and given to the patient.  Return in about 2 months (around 06/19/2018) for diabetes/cough.

## 2018-04-20 ENCOUNTER — Encounter: Payer: Self-pay | Admitting: Gastroenterology

## 2018-04-20 LAB — MICROALBUMIN / CREATININE URINE RATIO
Creatinine, Urine: 25.6 mg/dL
Microalb/Creat Ratio: 14.8 mg/g{creat} (ref 0.0–30.0)
Microalbumin, Urine: 3.8 ug/mL

## 2018-04-20 NOTE — Progress Notes (Signed)
Patient was called, no answer, unable to leave a message. If the patient returns called please inform the patient of a normal urine/creatinine result.

## 2018-04-23 ENCOUNTER — Ambulatory Visit: Payer: Self-pay | Attending: Critical Care Medicine | Admitting: Critical Care Medicine

## 2018-04-23 ENCOUNTER — Encounter: Payer: Self-pay | Admitting: Critical Care Medicine

## 2018-04-23 VITALS — BP 120/80 | HR 88 | Temp 98.8°F | Resp 18 | Ht 62.0 in | Wt 261.0 lb

## 2018-04-23 DIAGNOSIS — F17200 Nicotine dependence, unspecified, uncomplicated: Secondary | ICD-10-CM

## 2018-04-23 DIAGNOSIS — J4520 Mild intermittent asthma, uncomplicated: Secondary | ICD-10-CM

## 2018-04-23 MED ORDER — NICOTINE POLACRILEX 4 MG MT LOZG: LOZENGE | OROMUCOSAL | 3 refills | Status: DC

## 2018-04-23 NOTE — Assessment & Plan Note (Signed)
Mild intermittent asthma exacerbated by ongoing tobacco use and probable reflux disease Plan Smoking cessation counseling was given to the patient 10 minutes smoking cessation counseling was given to the patient via [place] interpreter The patient will use a short acting rescue inhaler albuterol 2 puffs every 6 hours as needed A pulmonary function study will be obtained pre-and postbronchodilator with spirometry Nicotine replacement therapy with nicotine 4 mg lozenge 3 times daily was recommended to be obtained over-the-counter

## 2018-04-23 NOTE — Progress Notes (Signed)
Subjective:    Patient ID: Julie Robertson, female    DOB: 05-18-1976, 42 y.o.   MRN: [id number]  42 y.o.F referred for evaluation of recurrent cough.    Pt smokes: 1PPD in 2days. Also passive smoke exposure at work. Pt cleans house of smokers   Cough  This is a new (started July 30th. no prodrome illness) problem. The current episode started more than 1 month ago. The problem has been unchanged. The cough is non-productive. Associated symptoms include wheezing. Pertinent negatives include no chest pain, ear congestion, ear pain, fever, headaches, heartburn, hemoptysis, nasal congestion, postnasal drip, rhinorrhea, sore throat, shortness of breath or sweats. Associated symptoms comments: If coughs a lot, feels something jumping in the abdomen.. The symptoms are aggravated by lying down. Risk factors for lung disease include smoking/tobacco exposure. She has tried OTC cough suppressant for the symptoms. The treatment provided mild relief. Her past medical history is significant for asthma and pneumonia. There is no history of environmental allergies. hx of stomach tumor 20yr ago ? if related to cough    Past Medical History:  Diagnosis Date  . Diabetes mellitus without complication ([place]   . Diabetic neuropathy ([place] 12/2013   vs carpal tunnell.  numbness tingling in right fingers. rx with Gabapentin.  .Marland KitchenDyslipidemia 08/2009  . Gall stones 08/2009  . Obesity    BMI 41, 250# 03/2015     Family History  Problem Relation Age of Onset  . Diabetes Mother   . Heart disease Mother   . Cancer Mother   . Cancer Father   . Diabetes Father   . Heart disease Father      Social History   Socioeconomic History  . Marital status: Legally Separated    Spouse name: Not on file  . Number of children: Not on file  . Years of education: Not on file  . Highest education level: Not on file  Occupational History  . Not on file  Social Needs  . Financial resource strain: Not on file  .  Food insecurity:    Worry: Not on file    Inability: Not on file  . Transportation needs:    Medical: Not on file    Non-medical: Not on file  Tobacco Use  . Smoking status: Current Every Day Smoker    Packs/day: 1.50    Years: 4.00    Pack years: 6.00    Types: Cigarettes  . Smokeless tobacco: Never Used  Substance and Sexual Activity  . Alcohol use: Yes    Comment: socially  . Drug use: No  . Sexual activity: Not on file  Lifestyle  . Physical activity:    Days per week: Not on file    Minutes per session: Not on file  . Stress: Not on file  Relationships  . Social connections:    Talks on phone: Not on file    Gets together: Not on file    Attends religious service: Not on file    Active member of club or organization: Not on file    Attends meetings of clubs or organizations: Not on file    Relationship status: Not on file  . Intimate partner violence:    Fear of current or ex partner: Not on file    Emotionally abused: Not on file    Physically abused: Not on file    Forced sexual activity: Not on file  Other Topics Concern  . Not on file  Social History  Narrative   Patient has 2 sons one is 42, the other 57 as of 03/2015. Her kids are not the children of her current husband. As of 03/2015 she is not employed outside the home.     No Known Allergies   Outpatient Medications Prior to Visit  Medication Sig Dispense Refill  . albuterol (PROVENTIL HFA;VENTOLIN HFA) 108 (90 Base) MCG/ACT inhaler Inhale 2 puffs into the lungs every 6 (six) hours as needed for wheezing or shortness of breath. 1 Inhaler 3  . atorvastatin (LIPITOR) 40 MG tablet Take 1 tablet (40 mg total) by mouth daily. 30 tablet 6  . Blood Glucose Monitoring Suppl (TRUE METRIX METER) w/Device KIT Use as directed 1 kit 0  . gabapentin (NEURONTIN) 300 MG capsule Take 1 capsule (300 mg total) by mouth 3 (three) times daily. 90 capsule 6  . glipiZIDE (GLUCOTROL) 5 MG tablet Take 1 tablet (5 mg total) by  mouth daily before breakfast. 30 tablet 6  . glucose blood (TRUE METRIX BLOOD GLUCOSE TEST) test strip Use as instructed 100 each 12  . ibuprofen (ADVIL,MOTRIN) 200 MG tablet Take 400 mg by mouth every 6 (six) hours as needed for moderate pain.    . Insulin Glargine (LANTUS SOLOSTAR) 100 UNIT/ML Solostar Pen Inject 50 Units into the skin daily. 5 pen 11  . Insulin Pen Needle 31G X 5 MM MISC Inject 10 units subcutaneous at bedtime 100 each 0  . losartan (COZAAR) 25 MG tablet Take 1 tablet (25 mg total) by mouth daily. 30 tablet 11  . metFORMIN (GLUCOPHAGE-XR) 500 MG 24 hr tablet Take 2 tablets (1,000 mg total) by mouth 2 (two) times daily. 60 tablet 6  . pantoprazole (PROTONIX) 40 MG tablet Take 1 tablet (40 mg total) by mouth daily. 30 tablet 3  . TRUEPLUS LANCETS 28G MISC Use as directed 100 each 12  . ofloxacin (FLOXIN) 0.3 % OTIC solution Place 10 drops into both ears daily. For 7 days (Patient not taking: Reported on 03/22/2018) 10 mL 0   No facility-administered medications prior to visit.      Review of Systems  Constitutional: Negative for fever.  HENT: Negative for ear pain, postnasal drip, rhinorrhea and sore throat.   Respiratory: Positive for cough and wheezing. Negative for hemoptysis and shortness of breath.   Cardiovascular: Negative for chest pain.  Gastrointestinal: Negative for heartburn.  Allergic/Immunologic: Negative for environmental allergies.  Neurological: Negative for headaches.       Objective:   Physical Exam Vitals:   04/23/18 1618  BP: 120/80  Pulse: 88  Resp: 18  Temp: 98.8 F (37.1 C)  TempSrc: Oral  SpO2: 100%  Weight: 261 lb (118.4 kg)  Height: 5' 2"  (1.575 m)    Gen: Pleasant, obese, in no distress,  normal affect  ENT: No lesions,  mouth clear,  oropharynx clear, no postnasal drip  Neck: No JVD, no TMG, no carotid bruits  Lungs: No use of accessory muscles, no dullness to percussion, few expiratory wheezes at bases, no  rales  Cardiovascular: RRR, heart sounds normal, no murmur or gallops, no peripheral edema  Abdomen: soft and NT, no HSM,  BS normal  Musculoskeletal: No deformities, no cyanosis or clubbing  Neuro: alert, non focal  Skin: Warm, no lesions or rashes  No results found.        Assessment & Plan:  I personally reviewed all images and lab data in [facility] as well as any outside material available during this office  visit and agree with the  radiology impressions.   Asthma Mild intermittent asthma exacerbated by ongoing tobacco use and probable reflux disease Plan Smoking cessation counseling was given to the patient 10 minutes smoking cessation counseling was given to the patient via [place] interpreter The patient will use a short acting rescue inhaler albuterol 2 puffs every 6 hours as needed A pulmonary function study will be obtained pre-and postbronchodilator with spirometry Nicotine replacement therapy with nicotine 4 mg lozenge 3 times daily was recommended to be obtained over-the-counter   Smoking Ongoing tobacco use exacerbating cyclic cough Plan Smoking cessation counseling was given to the patient Nicotine replacement therapy was also prescribed Referral to pharmacist for follow-up smoking cessation counseling was given   Mikesha was seen today for cough.  Diagnoses and all orders for this visit:  Mild intermittent asthma without complication -     Pulmonary Function Test; Future  Smoking -     Amb Referral to Clinical Pharmacist  Other orders -     nicotine polacrilex (NICOTINE MINI) 4 MG lozenge; Dissolve one in mouth three times daily    I had an extended discussion with the patient and or family lasting 10 minutes of a 25 minute visit including: Diagnosis of asthma, need for smoking cessation along with smoking cessation counseling

## 2018-04-23 NOTE — Patient Instructions (Addendum)
Stop smoking, use nicotine lozenge '4mg'$  Dissolve one in mouth three times a day, obtain over the counter Stop Lisinopril, start losartan one daily (this is at the pharmacy) Start Albuterol 2 puff every 6 hours for shortness of breath or cough A lung function test will be obtained, Dr Joya Gaskins will call with results An appointment with our smoking cessation counselor will be obtained. Return for recheck in one month with Dr Joya Gaskins    Asma - Adultos (Asthma, Adult) El asma es una enfermedad de los pulmones en la que las vas respiratorias se Investment banker, corporate y [place]. El asma puede causar dificultad para respirar. El asma no puede curarse, pero los [employer] y los cambios en el estilo de vida pueden ayudar a Therapist, sports. Los siguientes factores pueden iniciar (desencadenar) el asma:  Escamas de la piel de los animales (caspa).  Polvo.  Cucarachas.  Polen.  Moho.  Humo.  Productos de limpieza.  Aerosoles para el cabello.  Vapores de pintura u olores fuertes.  Aire fro, cambios climticos y vientos.  Llanto o risa intensos.  Estrs.  Ciertos medicamentos o drogas.  Alimentos, como frutas secas, papas fritas y vinos espumantes.  Infecciones o afecciones (resfros, gripe).  Haga actividad fsica.  Ciertas enfermedades o afecciones.  Ejercicio o actividades extenuantes. CUIDADOS EN EL HOGAR  Tome los Chief Technology Officer.  Use un medidor de flujo espiratorio mximo como le indic su mdico. El medidor de flujo espiratorio mximo es una herramienta que mide el funcionamiento de los pulmones.  Anote y lleve un registro de los valores del medidor de flujo espiratorio mximo.  Conozca el plan de accin para el asma y selo. El plan de accin para el asma es una planificacin por escrito para el control y tratamiento de sus crisis asmticas.  Para prevenir las crisis asmticas: ? No fume. Aljese del humo de otros fumadores. ? Cambie el filtro de la  calefaccin y del aire acondicionado con frecuencia. ? Limite el uso de hogares o estufas a lea. ? Elimine las plagas (como cucarachas, ratones) y sus excrementos. ? Elimine las plantas si observa moho en ellas. ? Limpie los pisos. Elimine el polvo regularmente. Use productos de limpieza que sean inoloros. ? Pdale a alguien que pase la aspiradora cuando usted no se encuentre en casa. Utilice una aspiradora con filtros HEPA, siempre que le sea posible. ? Reemplace las alfombras por pisos de [place], baldosas o vinilo. Las alfombras pueden retener las escamas de la piel de los animales y [place]. ? Use almohadas, mantas y cubre colchones antialrgicos. ? [place] sbanas y las mantas todas las semanas con agua caliente y squelas con aire caliente. ? Use mantas de polister o algodn. ? Limpie baos y cocinas con lavandina. Si fuera posible, pdale a alguien que vuelva a pintar las paredes de estas habitaciones con Ardelia Mems pintura resistente a los hongos. Aljese de las habitaciones que se estn limpiando y pintando. ? Lvese las manos con frecuencia.  SOLICITE AYUDA SI:  Hace un silbido al respirar (sibilancias), tiene falta de aire o tiene tos incluso despus de tomar los medicamentos para prevenir crisis.  El moco coloreado que elimina (esputo) es ms denso de lo normal.  El moco coloreado que elimina cambia de trasparente o blanco a [place], [place], gris o sanguinolento.  Tiene problemas causados por el medicamento que toma, por ejemplo: ? Una erupcin. ? Picazn. ? Hinchazn. ? Problemas respiratorios.  Necesita un medicamento de alivio ms de 2 a 3veces  por semana.  El flujo espiratorio mximo an est en 89 a 79% del Pharmacist, hospital personal despus de seguir el plan de accin durante 1hora.  Tiene fiebre.  SOLICITE AYUDA DE INMEDIATO SI:  Parece empeorar y no responde al medicamento durante una crisis asmtica.  Le falta el aire, incluso en reposo.  Le falta el aire incluso  cuando hace muy poca actividad fsica.  Tiene dificultad para comer, beber o hablar.  Siente dolor en el pecho.  La frecuencia cardaca est acelerada.  Sus labios o uas comienzan a Environmental education officer.  Usted se siente mareado, dbil o se desmaya.  Su flujo mximo es Garment/textile technologist del 50% del Pharmacist, hospital personal.  Esta informacin no tiene Marine scientist el consejo del mdico. Asegrese de hacerle al mdico cualquier pregunta que tenga. Document Released: 09/16/2008 Document Revised: 03/11/2015 Document Reviewed: 01/17/2013 Elsevier Interactive Patient Education  2017 [place]   Informacin sobre fumar tabaco Smoking Tobacco Information Fumar tabaco muy probablemente daar su salud. El tabaco contiene un qumico incoloro venenoso (txico) llamado nicotina. La nicotina afecta el cerebro y hace que el tabaco sea [place]. Este cambio en el cerebro puede hacer que sea difcil dejar de fumar. El tabaco tambin tiene otros qumicos txicos que pueden daar el cuerpo y aumentar el riesgo de contraer muchos tipos de cnceres. De qu forma me puede afectar fumar tabaco? Fumar tabaco puede aumentar las probabilidades de tener afecciones de salud graves, tales como:  Hotel manager. Fumar tabaco se asocia con mayor frecuencia al cncer de pulmn, pero puede provocar cncer en otras partes del cuerpo.  Enfermedad pulmonar obstructiva crnica (EPOC). Esta es una afeccin pulmonar a largo plazo que dificulta la respiracin. Tambin empeora con [employer].  Presin arterial alta (hipertensin), enfermedad cardaca, accidente cerebrovascular o infarto de miocardio.  Infecciones pulmonares, como la neumona.  Cataratas. Esto es cuando el lente natural del ojo se nubla.  Problemas digestivos. Esto puede incluir lceras ppticas, acidez estomacal y enfermedad de reflujo gastroesofgico (ERGE).  Problemas de salud bucal, como enfermedad de las encas y prdida de dientes.  Prdida del gusto y del  olfato.  Fumar puede afectar su aspecto al hacer que tenga:  [place].  Dientes, dedos y uas amarillos o [place].  Fumar tabaco tambin puede afectar su vida social.  En muchos lugares de [place], restaurantes, hoteles y sitios pblicos est prohibido fumar. Esto significa que puede tener dificultades para Pension scheme manager un sitio donde pueda fumar cuando no est en su casa.  El hbito de fumar puede ser [facility]. Los gastos para alguien que fuma [employer] cosas: ? El dinero que gasta regularmente para comprar tabaco. ? El costo de la atencin mdica a largo plazo es mayor si fuma.  El humo del tabaco tambin puede afectar la salud de quienes lo rodean. Los hijos de fumadores tienen ms probabilidades de padecer: ? Sndrome de muerte sbita del lactante (SMSL). ? Infecciones en los odos. ? Infecciones pulmonares.  Qu cambios en el estilo de vida se pueden realizar?  No comience a fumar. Deje de fumar si ya lo hace.  Para dejar de fumar: ? Haga un plan para dejar de fumar y compromtase a llevarlo a cabo. Busque programas que lo ayuden y Engineer, materials e ideas a su mdico. ? Hable con su mdico sobre el uso de medicamentos de reemplazo de la nicotina para que lo ayuden a Personal assistant de fumar. Los medicamentos de reemplazo de la nicotina incluyen goma de [place], [place], parches, [place] o pldoras. ?  No reemplace los cigarrillos comunes por cigarrillos electrnicos. La seguridad de los cigarrillos electrnicos no se conoce, y podran tener qumicos txicos. ? Evite los lugares, personas o situaciones que lo tienten a Management consultant. ? Si al intentar dejar el hbito vuelve a fumar, no pierda la esperanza. Es muy comn intentar varias veces antes de lograr dejar el hbito completamente. Cuando est listo, intntelo nuevamente.  Dejar de fumar podra afectar la forma en que come y Psychologist, forensic. Est preparado para tener que controlar sus hbitos alimenticios. [place] para  planificar y seguir una dieta saludable.  Consulte a su mdico acerca de realizarse exmenes peridicos (pruebas de deteccin) para la deteccin del cncer. Estas pueden incluir anlisis de [place], estudios de diagnstico por imgenes y otros exmenes.  Haga ejercicios regularmente. Considere realizar caminatas, unirse a un gimnasio o tomar clases de yoga o de ejercicio.  Desarrolle las habilidades para manejar su estrs. Estas habilidades incluyen la meditacin. Cules son los beneficios que se obtienen al dejar de fumar? Al dejar de fumar, puede:  Reducir el riesgo de Optician, dispensing y otras enfermedades que causa fumar tabaco.  Vivir ms tiempo.  Respirar mejor.  Reducir la frecuencia cardaca y la presin arterial.  Detener su adiccin al tabaco.  Dejar de crear humo de segunda mano que lastima a Producer, television/film/video.  Mejorar el sentido del gusto y Armed forces logistics/support/administrative officer.  Mejorar su aspecto con [employer], debido a que tendr [employer] y [employer].  Qu puede suceder si no se hacen cambios? Si no deja de fumar, puede:  Optician, dispensing y [employer].  Desarrollar EPOC u otras enfermedades de pulmn a largo plazo (crnicas).  Desarrollar problemas graves en el corazn y en los vasos sanguneos (sistema cardiovascular).  Necesitar realizarse ms exmenes para Hydrographic surveyor problemas de salud causados por el cigarrillo.  Tener costos de atencin mdica a Production designer, theatre/television/film plazo ms altos debido a los medicamentos o tratamientos relacionados con el tabaquismo.  Seguir teniendo [employer], boca y Lawyer.  Dnde encontrar apoyo: Para conseguir apoyo para dejar de fumar, considere:  Pedir al mdico que le brinde ms informacin y recursos.  Tomar clases para aprender ms sobre cmo dejar de fumar.  Buscar organizaciones locales que ofrezcan recursos para dejar de fumar.  Unirse a un grupo de apoyo para personas que quieren dejar de fumar en su comunidad  local.  Dnde encontrar ms informacin: Puede encontrar ms informacin sobre cmo dejar de fumar de las siguientes organizaciones:  HelpGuide.org: www.helpguide.org/articles/addictions/how-to-quit-smoking.htm  https://hall.com/: smokefree.gov  Asociacin Estadounidense del Pulmn (American Lung Association): www.lung.org  Comunquese con un mdico si:  Tiene dificultad para respirar.  Sus labios, nariz o dedos comienzan a ponerse azules.  Siente dolor en el pecho.  Elimina sangre al toser.  Sufre mareos o se desmaya.  Tiene otros cambios notables que lo preocupan. Resumen  Fumar tabaco puede afectar negativamente su salud, la salud de quienes lo rodean, sus finanzas y su vida social.  No comience a fumar. Deje de fumar si ya lo hace. Si necesita ayuda para dejar de fumar, consulte al [employer].  Piense en unirse a un grupo de apoyo para personas que quieren dejar de fumar en su comunidad local. Existen muchos programas efectivos que lo ayudarn a Mining engineer hbito. Esta informacin no tiene Marine scientist el consejo del mdico. Asegrese de hacerle al mdico cualquier pregunta que tenga. Document Released: 11/01/2016 Document Revised: 11/01/2016 Document Reviewed: 11/01/2016 Elsevier Interactive Patient Education  2018  [employer].

## 2018-04-23 NOTE — Assessment & Plan Note (Signed)
Ongoing tobacco use exacerbating cyclic cough Plan Smoking cessation counseling was given to the patient Nicotine replacement therapy was also prescribed Referral to pharmacist for follow-up smoking cessation counseling was given

## 2018-05-08 ENCOUNTER — Ambulatory Visit: Payer: Medicaid Other | Admitting: Pharmacist

## 2018-05-08 NOTE — Progress Notes (Deleted)
S:  Patient was reviewed by clinical pharmacist for assistance with tobacco cessation.   Tobacco Use History  Age when started using tobacco on a daily basis ***.  Type: {Nicotine:3044014::"cigarettes","cigar","pipe","electronic cigarettes","snus"}.  Number of cigarettes per day ***, brand ***.  Estimated nicotine content: *** mg per day.   Smokes first cigarette *** minutes after waking.  {Does/does not:3044014::"Does","Does not"} wake at night to smoke  Fagerstrom Score ***/10.  Triggers include {hx nicotine triggers:311123}.  Quit Attempt History   Most recent quit attempt ***.  Longest time ever been tobacco free ***.  Methods tried in the past include {CHL AMB PCMH MEDICATIONS FOR SMOKING CESSATION:20759}.   Rates IMPORTANCE of quitting tobacco on 1-10 scale of ***.  Rates READINESS of quitting tobacco on 1-10 scale of ***.  Rates CONFIDENCE of quitting tobacco on 1-10 scale of ***.  Motivators to quitting include ***; barriers include {smoking cessation barriers:18118}   A/P: Nicotine dependence: {DESC; MILD/MOD/SEVERE:15682} , *** years duration in a patient who is {excellent/good/fair/poor:19665} candidate for success b/c of ***.     Reviewed options and patient reports ***. Initiated {CHL AMB PCMH MEDICATIONS FOR SMOKING CESSATION:20759}. Treatment was reviewed with the patient, including name, instructions, goals of therapy, potential side effects, importance of adherence, and safe use.  Reviewed potential challenges and coping skills/strategies with patient. Provided information on 1 800-QUIT NOW support program and advised patient to contact me if questions/concerns arise. Patient verbalized understanding of information by repeating back.  Follow up {follow up:15908}  Karren Cobble [place] 11:38 AM 05/08/2018

## 2018-05-18 ENCOUNTER — Ambulatory Visit (INDEPENDENT_AMBULATORY_CARE_PROVIDER_SITE_OTHER): Payer: Self-pay | Admitting: Gastroenterology

## 2018-05-18 ENCOUNTER — Encounter: Payer: Self-pay | Admitting: Gastroenterology

## 2018-05-18 VITALS — BP 124/80 | HR 82 | Ht 65.0 in | Wt 261.0 lb

## 2018-05-18 DIAGNOSIS — K5909 Other constipation: Secondary | ICD-10-CM

## 2018-05-18 DIAGNOSIS — K219 Gastro-esophageal reflux disease without esophagitis: Secondary | ICD-10-CM

## 2018-05-18 DIAGNOSIS — K625 Hemorrhage of anus and rectum: Secondary | ICD-10-CM

## 2018-05-18 MED ORDER — FAMOTIDINE 20 MG PO TABS: 20.0000 mg | ORAL_TABLET | Freq: Every day | ORAL | 3 refills | Status: DC

## 2018-05-18 NOTE — Patient Instructions (Addendum)
You have been scheduled for an endoscopy and colonoscopy. Please follow the written instructions given to you at your visit today. Please pick up your prep supplies at the pharmacy within the next 1-3 days. If you use inhalers (even only as needed), please bring them with you on the day of your procedure.   We have given you a sample Suprep kit today  Take Pepcid 20 mg at bedtime  Continue Protonix  Take Miralax 1 capful daily as needed  Quit Smoking   Gastroesophageal Reflux Disease, Adult Normally, food travels down the esophagus and stays in the stomach to be digested. However, when a person has gastroesophageal reflux disease (GERD), food and stomach acid move back up into the esophagus. When this happens, the esophagus becomes sore and inflamed. Over time, GERD can create small holes (ulcers) in the lining of the esophagus. What are the causes? This condition is caused by a problem with the muscle between the esophagus and the stomach (lower esophageal sphincter, or LES). Normally, the LES muscle closes after food passes through the esophagus to the stomach. When the LES is weakened or abnormal, it does not close properly, and that allows food and stomach acid to go back up into the esophagus. The LES can be weakened by certain dietary substances, medicines, and medical conditions, including:  Tobacco use.  Pregnancy.  Having a hiatal hernia.  Heavy alcohol use.  Certain foods and beverages, such as coffee, chocolate, onions, and peppermint.  What increases the risk? This condition is more likely to develop in:  People who have an increased body weight.  People who have connective tissue disorders.  People who use NSAID medicines.  What are the signs or symptoms? Symptoms of this condition include:  Heartburn.  Difficult or painful swallowing.  The feeling of having a lump in the throat.  Abitter taste in the mouth.  Bad breath.  Having a large amount of  saliva.  Having an upset or bloated stomach.  Belching.  Chest pain.  Shortness of breath or wheezing.  Ongoing (chronic) cough or a night-time cough.  Wearing away of tooth enamel.  Weight loss.  Different conditions can cause chest pain. Make sure to see your health care provider if you experience chest pain. How is this diagnosed? Your health care provider will take a medical history and perform a physical exam. To determine if you have mild or severe GERD, your health care provider may also monitor how you respond to treatment. You may also have other tests, including:  An endoscopy toexamine your stomach and esophagus with a small camera.  A test thatmeasures the acidity level in your esophagus.  A test thatmeasures how much pressure is on your esophagus.  A barium swallow or modified barium swallow to show the shape, size, and functioning of your esophagus.  How is this treated? The goal of treatment is to help relieve your symptoms and to prevent complications. Treatment for this condition may vary depending on how severe your symptoms are. Your health care provider may recommend:  Changes to your diet.  Medicine.  Surgery.  Follow these instructions at home: Diet  Follow a diet as recommended by your health care provider. This may involve avoiding foods and drinks such as: ? Coffee and tea (with or without caffeine). ? Drinks that containalcohol. ? Energy drinks and sports drinks. ? Carbonated drinks or sodas. ? Chocolate and cocoa. ? Peppermint and mint flavorings. ? Garlic and onions. ? Horseradish. ? Spicy and  acidic foods, including peppers, chili powder, curry powder, vinegar, hot sauces, and barbecue sauce. ? Citrus fruit juices and citrus fruits, such as oranges, lemons, and limes. ? Tomato-based foods, such as red sauce, chili, salsa, and pizza with red sauce. ? Fried and fatty foods, such as donuts, french fries, potato chips, and high-fat  dressings. ? High-fat meats, such as hot dogs and fatty cuts of red and white meats, such as rib eye steak, sausage, ham, and bacon. ? High-fat dairy items, such as whole milk, butter, and cream cheese.  Eat small, frequent meals instead of large meals.  Avoid drinking large amounts of liquid with your meals.  Avoid eating meals during the 2-3 hours before bedtime.  Avoid lying down right after you eat.  Do not exercise right after you eat. General instructions  Pay attention to any changes in your symptoms.  Take over-the-counter and prescription medicines only as told by your health care provider. Do not take aspirin, ibuprofen, or other NSAIDs unless your health care provider told you to do so.  Do not use any tobacco products, including cigarettes, chewing tobacco, and e-cigarettes. If you need help quitting, ask your health care provider.  Wear loose-fitting clothing. Do not wear anything tight around your waist that causes pressure on your abdomen.  Raise (elevate) the head of your bed 6 inches (15cm).  Try to reduce your stress, such as with yoga or meditation. If you need help reducing stress, ask your health care provider.  If you are overweight, reduce your weight to an amount that is healthy for you. Ask your health care provider for guidance about a safe weight loss goal.  Keep all follow-up visits as told by your health care provider. This is important. Contact a health care provider if:  You have new symptoms.  You have unexplained weight loss.  You have difficulty swallowing, or it hurts to swallow.  You have wheezing or a persistent cough.  Your symptoms do not improve with treatment.  You have a hoarse voice. Get help right away if:  You have pain in your arms, neck, jaw, teeth, or back.  You feel sweaty, dizzy, or light-headed.  You have chest pain or shortness of breath.  You vomit and your vomit looks like blood or coffee grounds.  You  faint.  Your stool is bloody or black.  You cannot swallow, drink, or eat. This information is not intended to replace advice given to you by your health care provider. Make sure you discuss any questions you have with your health care provider. Document Released: 03/30/2005 Document Revised: 11/18/2015 Document Reviewed: 10/15/2014 Elsevier Interactive Patient Education  [employer].

## 2018-05-18 NOTE — Progress Notes (Signed)
Julie Robertson    [id number]    [birth year]  Primary Care Physician:Fulp, Ander Gaster, MD  Referring Physician: Antony Blackbird, MD [place], Julie Robertson [id number]  Chief complaint:  Ampullary adenoma, GERD/heartburn  HPI: 42 year old female here to establish care.  She was previously followed at [facility]. 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  She was recommended surveillance EGD in 1 year,, is past due. Denies any abdominal pain, dysphagia, nausea, vomiting, change in bowel habits, melena, loss of appetite or weight loss.  She has intermittent bright red blood per rectum worse when she strains with hard stool She has intermittent heartburn, regurgitation and acid taste in mouth.  Symptoms are better with pantoprazole. No history of colonoscopy.  No family history of colon cancer.   Outpatient Encounter Medications as of 05/18/2018  Medication Sig  . albuterol (PROVENTIL HFA;VENTOLIN HFA) 108 (90 Base) MCG/ACT inhaler Inhale 2 puffs into the lungs every 6 (six) hours as needed for wheezing or shortness of breath.  Marland Kitchen atorvastatin (LIPITOR) 40 MG tablet Take 1 tablet (40 mg total) by mouth daily.  . Blood Glucose Monitoring Suppl (TRUE METRIX METER) w/Device KIT Use as directed  . gabapentin (NEURONTIN) 300 MG capsule Take 1 capsule (300 mg total) by mouth 3 (three) times daily.  Marland Kitchen glipiZIDE (GLUCOTROL) 5 MG tablet Take 1 tablet (5 mg total) by mouth daily before breakfast.  . glucose blood (TRUE METRIX BLOOD GLUCOSE TEST) test strip Use as instructed  . ibuprofen (ADVIL,MOTRIN) 200 MG tablet Take 400 mg by mouth every 6 (six) hours as needed for moderate pain.  . Insulin Glargine (LANTUS SOLOSTAR) 100 UNIT/ML Solostar Pen Inject 50 Units into the skin daily.  . Insulin Pen Needle 31G X 5 MM MISC Inject 10 units subcutaneous at bedtime  . losartan (COZAAR) 25 MG tablet Take 1 tablet (25 mg total)  by mouth daily.  . metFORMIN (GLUCOPHAGE-XR) 500 MG 24 hr tablet Take 2 tablets (1,000 mg total) by mouth 2 (two) times daily.  . pantoprazole (PROTONIX) 40 MG tablet Take 1 tablet (40 mg total) by mouth daily.  . TRUEPLUS LANCETS 28G MISC Use as directed  . nicotine polacrilex (NICOTINE MINI) 4 MG lozenge Dissolve one in mouth three times daily (Patient not taking: Reported on 05/18/2018)   No facility-administered encounter medications on file as of 05/18/2018.     Allergies as of 05/18/2018  . (No Known Allergies)    Past Medical History:  Diagnosis Date  . Diabetes mellitus without complication ([place])   . Diabetic neuropathy ([place]) 12/2013   vs carpal tunnell.  numbness tingling in right fingers. rx with Gabapentin.  Marland Kitchen Dyslipidemia 08/2009  . Gall stones 08/2009  . Obesity    BMI 41, 250# 03/2015    Past Surgical History:  Procedure Laterality Date  . CHOLECYSTECTOMY N/A 03/10/2015   Procedure: LAPAROSCOPIC CHOLECYSTECTOMY WITH INTRAOPERATIVE CHOLANGIOGRAM;  Surgeon: Georganna Skeans, MD;  Location: [facility];  Service: General;  Laterality: N/A;  . ERCP N/A 03/11/2015   Procedure: ENDOSCOPIC RETROGRADE CHOLANGIOPANCREATOGRAPHY (ERCP);  Surgeon: Milus Banister, MD;  Location: [employer] [facility];  Service: Endoscopy;  Laterality: N/A;  . TUBAL LIGATION      Family History  Problem Relation Age of Onset  . Diabetes Mother   . Heart disease Mother   . Hypertension Mother   . Diabetes Father   . Bone cancer Maternal Grandfather   .  Kidney disease Maternal Uncle   . Other Son        had kidney removed due to gun shot wound    Social History   Socioeconomic History  . Marital status: Divorced    Spouse name: Not on file  . Number of children: 2  . Years of education: Not on file  . Highest education level: Not on file  Occupational History  . Occupation: Development worker, community  Social Needs  . Financial resource strain: Not on file  . Food insecurity:    Worry: Not on file    Inability:  Not on file  . Transportation needs:    Medical: Not on file    Non-medical: Not on file  Tobacco Use  . Smoking status: Current Every Day Smoker    Packs/day: 1.50    Years: 4.00    Pack years: 6.00    Types: Cigarettes  . Smokeless tobacco: Never Used  Substance and Sexual Activity  . Alcohol use: Yes    Comment: rare  . Drug use: No  . Sexual activity: Not on file  Lifestyle  . Physical activity:    Days per week: Not on file    Minutes per session: Not on file  . Stress: Not on file  Relationships  . Social connections:    Talks on phone: Not on file    Gets together: Not on file    Attends religious service: Not on file    Active member of club or organization: Not on file    Attends meetings of clubs or organizations: Not on file    Relationship status: Not on file  . Intimate partner violence:    Fear of current or ex partner: Not on file    Emotionally abused: Not on file    Physically abused: Not on file    Forced sexual activity: Not on file  Other Topics Concern  . Not on file  Social History Narrative   Patient has 2 sons one is 47, the other 58 as of 03/2015. Julie Robertson are not the children of Julie current husband. As of 03/2015 she is not employed outside the home.      Review of systems: Review of Systems  Constitutional: Negative for fever and chills.  HENT: Negative.   Eyes: Negative for blurred vision.  Respiratory: Negative for cough, shortness of breath and wheezing.   Cardiovascular: Negative for chest pain and palpitations.  Gastrointestinal: as per HPI Genitourinary: Negative for dysuria, urgency, frequency and hematuria.  Musculoskeletal: Negative for myalgias, back pain and joint pain.  Skin: Negative for itching and rash.  Neurological: Negative for dizziness, tremors, focal weakness, seizures and loss of consciousness.  Endo/Heme/Allergies: Positive for seasonal allergies.  Psychiatric/Behavioral: Negative for depression, suicidal ideas and  hallucinations.  All other systems reviewed and are negative.   Physical Exam: Vitals:   05/18/18 1013  BP: 124/80  Pulse: 82   Body mass index is 43.43 kg/m. Gen:      No acute distress HEENT:  EOMI, sclera anicteric Neck:     No masses; no thyromegaly Lungs:    Clear to auscultation bilaterally; normal respiratory effort CV:         Regular rate and rhythm; no murmurs Abd:      + bowel sounds; soft, non-tender; no palpable masses, no distension Ext:    No edema; adequate peripheral perfusion Skin:      Warm and dry; no rash Neuro: alert and oriented x  3 Psych: normal mood and affect  Data Reviewed:  Reviewed labs, radiology imaging, old records and pertinent past GI work up   Assessment and Plan/Recommendations:  42 year old female with history of ampullary adenoma status post resection in March 2017 here with complaints of intermittent heartburn, constipation and bright red blood per rectum Past due for surveillance EGD We will schedule for EGD and also colonoscopy for evaluation of blood per rectum and exclude any colo rectal polyps or malignancy given history of ampullary adenoma Continue Protonix 40 mg daily, 30 minutes before breakfast for GERD Add Pepcid 20 mg at bedtime as needed Antireflux measures Start MiraLAX 1 capful daily constipation Avoid excessive straining with defecation Increase dietary fiber and fluid intake The risks and benefits as well as alternatives of endoscopic procedure(s) have been discussed and reviewed. All questions answered. The patient agrees to proceed.    Damaris Hippo , MD [phone]    CC: Antony Blackbird, MD

## 2018-05-24 ENCOUNTER — Encounter: Payer: Self-pay | Admitting: Gastroenterology

## 2018-06-07 ENCOUNTER — Ambulatory Visit: Payer: Self-pay | Attending: Family Medicine

## 2018-06-11 ENCOUNTER — Ambulatory Visit (AMBULATORY_SURGERY_CENTER): Payer: Self-pay | Admitting: Gastroenterology

## 2018-06-11 ENCOUNTER — Encounter: Payer: Self-pay | Admitting: Gastroenterology

## 2018-06-11 VITALS — BP 99/63 | HR 64 | Temp 98.2°F | Resp 15 | Ht 65.0 in | Wt 261.0 lb

## 2018-06-11 DIAGNOSIS — K621 Rectal polyp: Secondary | ICD-10-CM

## 2018-06-11 DIAGNOSIS — K2970 Gastritis, unspecified, without bleeding: Secondary | ICD-10-CM

## 2018-06-11 DIAGNOSIS — D135 Benign neoplasm of extrahepatic bile ducts: Secondary | ICD-10-CM

## 2018-06-11 DIAGNOSIS — K3189 Other diseases of stomach and duodenum: Secondary | ICD-10-CM

## 2018-06-11 DIAGNOSIS — D128 Benign neoplasm of rectum: Secondary | ICD-10-CM

## 2018-06-11 DIAGNOSIS — D129 Benign neoplasm of anus and anal canal: Secondary | ICD-10-CM

## 2018-06-11 DIAGNOSIS — K625 Hemorrhage of anus and rectum: Secondary | ICD-10-CM

## 2018-06-11 DIAGNOSIS — D123 Benign neoplasm of transverse colon: Secondary | ICD-10-CM

## 2018-06-11 MED ORDER — SODIUM CHLORIDE 0.9 % IV SOLN: 500.0000 mL | Freq: Once | INTRAVENOUS | Status: DC

## 2018-06-11 NOTE — Op Note (Signed)
[facility] Patient Name: Julie Robertson Procedure Date: 06/11/2018 3:48 PM MRN: [id number] Endoscopist: Mauri Pole , MD Age: 42 Referring MD:  Date of Birth: 06/28/1976 Gender: Female Account #: [id number] Procedure:                Colonoscopy Indications:              Evaluation of unexplained GI bleeding Medicines:                Monitored Anesthesia Care Procedure:                Pre-Anesthesia Assessment:                           - Prior to the procedure, a History and Physical                            was performed, and patient medications and                            allergies were reviewed. The patient's tolerance of                            previous anesthesia was also reviewed. The risks                            and benefits of the procedure and the sedation                            options and risks were discussed with the patient.                            All questions were answered, and informed consent                            was obtained. Prior Anticoagulants: The patient has                            taken no previous anticoagulant or antiplatelet                            agents. ASA Grade Assessment: III - A patient with                            severe systemic disease. After reviewing the risks                            and benefits, the patient was deemed in                            satisfactory condition to undergo the procedure.                           After obtaining informed consent, the colonoscope  was passed under direct vision. Throughout the                            procedure, the patient's blood pressure, pulse, and                            oxygen saturations were monitored continuously. The                            Colonoscope was introduced through the anus and                            advanced to the the cecum, identified by                            appendiceal  orifice and ileocecal valve. The                            colonoscopy was performed without difficulty. The                            patient tolerated the procedure well. The quality                            of the bowel preparation was excellent. The                            terminal ileum, ileocecal valve, appendiceal                            orifice, and rectum were photographed. Scope In: 4:11:30 PM Scope Out: 4:25:31 PM Scope Withdrawal Time: 0 hours 8 minutes 25 seconds  Total Procedure Duration: 0 hours 14 minutes 1 second  Findings:                 The perianal and digital rectal examinations were                            normal.                           Two sessile polyps were found in the transverse                            colon. The polyps were 4 to 6 mm in size. These                            polyps were removed with a cold snare. Resection                            and retrieval were complete.                           Two sessile polyps were found in the rectum. The  polyps were 1 to 2 mm in size. These polyps were                            removed with a cold biopsy forceps. Resection and                            retrieval were complete.                           Non-bleeding internal hemorrhoids were found during                            retroflexion. The hemorrhoids were small. Complications:            No immediate complications. Estimated Blood Loss:     Estimated blood loss was minimal. Impression:               - Two 4 to 6 mm polyps in the transverse colon,                            removed with a cold snare. Resected and retrieved.                           - Two 1 to 2 mm polyps in the rectum, removed with                            a cold biopsy forceps. Resected and retrieved.                           - Non-bleeding internal hemorrhoids. Recommendation:           - Patient has a contact number available for                             emergencies. The signs and symptoms of potential                            delayed complications were discussed with the                            patient. Return to normal activities tomorrow.                            Written discharge instructions were provided to the                            patient.                           - Resume previous diet.                           - Continue present medications.                           - Await  pathology results.                           - Repeat colonoscopy in 3 - 5 years for                            surveillance based on pathology results. Mauri Pole, MD 06/11/2018 4:37:50 PM This report has been signed electronically.

## 2018-06-11 NOTE — Progress Notes (Signed)
PT taken to PACU. Monitors in place. VSS. Report given to RN. 

## 2018-06-11 NOTE — Progress Notes (Signed)
Called to room to assist during endoscopic procedure.  Patient ID and intended procedure confirmed with present staff. Received instructions for my participation in the procedure from the performing physician.  

## 2018-06-11 NOTE — Progress Notes (Signed)
Pt's states no medical or surgical changes since previsit or office visit. 

## 2018-06-11 NOTE — Patient Instructions (Signed)
   Gastric biopsies were done -  Colon polyps were removed -  Await pathology results on both  INFORMATION ON POLYPS AND HEMORRHOIDS GIVEN TO YOU TODAY  YOUR BLOOD SUGAR WAS 111 IN RECOVERY ROOM   YOU HAD AN ENDOSCOPIC PROCEDURE TODAY AT [facility]:   Refer to the procedure report that was given to you for any specific questions about what was found during the examination.  If the procedure report does not answer your questions, please call your gastroenterologist to clarify.  If you requested that your care partner not be given the details of your procedure findings, then the procedure report has been included in a sealed envelope for you to review at your convenience later.  YOU SHOULD EXPECT: Some feelings of bloating in the abdomen. Passage of more gas than usual.  Walking can help get rid of the air that was put into your GI tract during the procedure and reduce the bloating. If you had a lower endoscopy (such as a colonoscopy or flexible sigmoidoscopy) you may notice spotting of blood in your stool or on the toilet paper. If you underwent a bowel prep for your procedure, you may not have a normal bowel movement for a few days.  Please Note:  You might notice some irritation and congestion in your nose or some drainage.  This is from the oxygen used during your procedure.  There is no need for concern and it should clear up in a day or so.  SYMPTOMS TO REPORT IMMEDIATELY:   Following lower endoscopy (colonoscopy or flexible sigmoidoscopy):  Excessive amounts of blood in the stool  Significant tenderness or worsening of abdominal pains  Swelling of the abdomen that is new, acute  Fever of 100F or higher    For urgent or emergent issues, a gastroenterologist can be reached at any hour by calling [phone].   DIET:  We do recommend a small meal at first, but then you may proceed to your regular diet.  Drink plenty of fluids but you should avoid alcoholic  beverages for 24 hours.  ACTIVITY:  You should plan to take it easy for the rest of today and you should NOT DRIVE or use heavy machinery until tomorrow (because of the sedation medicines used during the test).    FOLLOW UP: Our staff will call the number listed on your records the next business day following your procedure to check on you and address any questions or concerns that you may have regarding the information given to you following your procedure. If we do not reach you, we will leave a message.  However, if you are feeling well and you are not experiencing any problems, there is no need to return our call.  We will assume that you have returned to your regular daily activities without incident.  If any biopsies were taken you will be contacted by phone or by letter within the next 1-3 weeks.  Please call us at [phone] if you have not heard about the biopsies in 3 weeks.    SIGNATURES/CONFIDENTIALITY: You and/or your care partner have signed paperwork which will be entered into your electronic medical record.  These signatures attest to the fact that that the information above on your After Visit Summary has been reviewed and is understood.  Full responsibility of the confidentiality of this discharge information lies with you and/or your care-partner.

## 2018-06-11 NOTE — Op Note (Signed)
Julie Robertson Patient Name: Julie Robertson Procedure Date: 06/11/2018 3:46 PM MRN: [id number] Endoscopist: Mauri Pole , MD Age: 42 Referring MD:  Date of Birth: [birth year] Gender: Female Account #: [id number] Procedure:                Upper GI endoscopy Indications:              Follow-up of polyps in the duodenum ( Ampullary                            adenoma s/p resection) Medicines:                Monitored Anesthesia Care Procedure:                Pre-Anesthesia Assessment:                           - Prior to the procedure, a History and Physical                            was performed, and patient medications and                            allergies were reviewed. The patient's tolerance of                            previous anesthesia was also reviewed. The risks                            and benefits of the procedure and the sedation                            options and risks were discussed with the patient.                            All questions were answered, and informed consent                            was obtained. Prior Anticoagulants: The patient has                            taken no previous anticoagulant or antiplatelet                            agents. ASA Grade Assessment: III - A patient with                            severe systemic disease. After reviewing the risks                            and benefits, the patient was deemed in                            satisfactory condition to undergo the procedure.  After obtaining informed consent, the endoscope was                            passed under direct vision. Throughout the                            procedure, the patient's blood pressure, pulse, and                            oxygen saturations were monitored continuously. The                            Endoscope was introduced through the mouth, and                            advanced to the  second part of duodenum. The upper                            GI endoscopy was accomplished without difficulty.                            The patient tolerated the procedure well. Scope In: Scope Out: Findings:                 The Z-line was regular and was found 35 cm from the                            incisors.                           A single 12 mm sessile polypoid subepithelial                            lesion was found in the gastric pre pyloric region.                            Biopsies were taken with a cold forceps for                            histology.                           The cardia and gastric fundus were normal on                            retroflexion.                           The first portion of the duodenum and second                            portion of the duodenum were normal.                           Unable to visualize Ampulla adequately Complications:  No immediate complications. Estimated Blood Loss:     Estimated blood loss was minimal. Impression:               - Z-line regular, 35 cm from the incisors.                           - A single gastric polyp. Biopsied.                           - Normal first portion of the duodenum and second                            portion of the duodenum. Recommendation:           - Resume previous diet.                           - Continue present medications.                           - Await pathology results.                           - Repeat upper endoscopy after reviewing path                            results, will need to consider EGD with side                            viewing scope to better visualize the ampulla.                           - Return to GI clinic in 3 months. Mauri Pole, MD 06/11/2018 4:35:39 PM This report has been signed electronically.

## 2018-06-12 ENCOUNTER — Telehealth: Payer: Self-pay

## 2018-06-12 NOTE — Telephone Encounter (Signed)
  Follow up Call-  Call back number 06/11/2018  Post procedure Call Back phone  #  [phone]  Permission to leave phone message Yes  Some recent data might be hidden     Patient questions:  Do you have a fever, pain , or abdominal swelling? No. Pain Score  0 *  Have you tolerated food without any problems? Yes.    Have you been able to return to your normal activities? Yes.    Do you have any questions about your discharge instructions: Diet   No. Medications  No. Follow up visit  No.  Do you have questions or concerns about your Care? No.  Actions: * If pain score is 4 or above: No action needed, pain <4.

## 2018-06-18 ENCOUNTER — Encounter: Payer: Self-pay | Admitting: Gastroenterology

## 2018-06-18 ENCOUNTER — Telehealth: Payer: Self-pay | Admitting: Gastroenterology

## 2018-06-18 ENCOUNTER — Other Ambulatory Visit: Payer: Self-pay

## 2018-06-18 DIAGNOSIS — D135 Benign neoplasm of extrahepatic bile ducts: Secondary | ICD-10-CM

## 2018-06-18 NOTE — Telephone Encounter (Signed)
Barbie Haggis can you go over these instructions with her. I will mail them as well. Thank you for your help.    Julie Robertson   DOB: 1976-06-02 MRN: [id number]  Procedure Date: 07/25/18 Arrival Time: 9 am Procedure Time: 3810 am   Location of Procedure: [facility] Registration  PREPARATION FOR ENDOSCOPY  On 07/25/18 THE DAY OF THE PROCEDURE:  1. No solid foods, milk or milk products are allowed after midnight the night before your procedure.  2. Do not drink anything colored red or purple. Avoid juices with pulp. No orange juice.  3. You may drink clear liquids until 630 am, which is 4 hours before your procedure.  CLEAR LIQUIDS INCLUDE: Water Jello  Ice Popsicles  Tea (sugar ok, no milk/cream) Powdered fruit flavored drinks  Coffee (sugar ok, no milk/cream) Gatorade  Juice: apple, white grape, white cranberry Lemonade  Clear bullion, consomme, broth Carbonated beverages (any kind)  Strained chicken noodle soup Hard Candy    MEDICATION INSTRUCTIONS  Unless otherwise instructed, you should take regular prescription medications with a small sip of water as early as possible the morning of your procedure.  Diabetic patients - 1/2 the regular dose of insulin the night prior to the procedure and NO diabetes medications the morning of the procedure.  OTHER INSTRUCTIONS  You will need a responsible adult at least 43 years of age to accompany you and drive you home. This person must remain in the waiting room during your procedure.  Wear loose fitting clothing that is easily removed.  Leave jewelry and other valuables at home. However, you may wish to bring a book to read or an iPod/MP3 player to listen to music as you wait for your procedure to start.  Remove all body piercing jewelry and leave at home.  Total time from sign-in until discharge is approximately 2-3 hours.  You should go home directly after your procedure and rest. You can resume normal activities  the day after your procedure.  The day of your procedure you should not:  Drive  Make legal decisions  Operate machinery  Drink alcohol  Return to work  You will receive specific instructions about eating, activities and medications before you leave.

## 2018-06-19 ENCOUNTER — Ambulatory Visit: Payer: Medicaid Other | Admitting: Family Medicine

## 2018-07-25 ENCOUNTER — Ambulatory Visit (HOSPITAL_COMMUNITY): Payer: Self-pay | Admitting: Anesthesiology

## 2018-07-25 ENCOUNTER — Other Ambulatory Visit: Payer: Self-pay

## 2018-07-25 ENCOUNTER — Encounter (HOSPITAL_COMMUNITY)
Admission: RE | Disposition: A | Discharge status: Home / Self Care | Payer: Self-pay | Source: Home / Self Care | Attending: Gastroenterology

## 2018-07-25 ENCOUNTER — Encounter (HOSPITAL_COMMUNITY): Payer: Self-pay | Admitting: Gastroenterology

## 2018-07-25 ENCOUNTER — Ambulatory Visit (HOSPITAL_COMMUNITY)
Admission: RE | Admit: 2018-07-25 | Discharge: 2018-07-25 | Disposition: A | Discharge status: Home / Self Care | Payer: Self-pay | Attending: Gastroenterology | Admitting: Gastroenterology

## 2018-07-25 DIAGNOSIS — Z09 Encounter for follow-up examination after completed treatment for conditions other than malignant neoplasm: Secondary | ICD-10-CM | POA: Insufficient documentation

## 2018-07-25 DIAGNOSIS — K259 Gastric ulcer, unspecified as acute or chronic, without hemorrhage or perforation: Secondary | ICD-10-CM | POA: Insufficient documentation

## 2018-07-25 DIAGNOSIS — K317 Polyp of stomach and duodenum: Secondary | ICD-10-CM

## 2018-07-25 DIAGNOSIS — K219 Gastro-esophageal reflux disease without esophagitis: Secondary | ICD-10-CM

## 2018-07-25 DIAGNOSIS — K3189 Other diseases of stomach and duodenum: Secondary | ICD-10-CM

## 2018-07-25 DIAGNOSIS — F17210 Nicotine dependence, cigarettes, uncomplicated: Secondary | ICD-10-CM | POA: Insufficient documentation

## 2018-07-25 DIAGNOSIS — Z6841 Body Mass Index (BMI) 40.0 and over, adult: Secondary | ICD-10-CM | POA: Insufficient documentation

## 2018-07-25 DIAGNOSIS — Z8719 Personal history of other diseases of the digestive system: Secondary | ICD-10-CM

## 2018-07-25 DIAGNOSIS — D135 Benign neoplasm of extrahepatic bile ducts: Secondary | ICD-10-CM

## 2018-07-25 DIAGNOSIS — I10 Essential (primary) hypertension: Secondary | ICD-10-CM | POA: Insufficient documentation

## 2018-07-25 DIAGNOSIS — E1140 Type 2 diabetes mellitus with diabetic neuropathy, unspecified: Secondary | ICD-10-CM | POA: Insufficient documentation

## 2018-07-25 HISTORY — PX: ESOPHAGOGASTRODUODENOSCOPY (EGD) WITH PROPOFOL: SHX5813

## 2018-07-25 LAB — GLUCOSE, CAPILLARY
Glucose-Capillary: 274 mg/dL — ABNORMAL HIGH (ref 70–99)
Glucose-Capillary: 243 mg/dL — ABNORMAL HIGH (ref 70–99)

## 2018-07-25 LAB — PREGNANCY, URINE: Preg Test, Ur: NEGATIVE

## 2018-07-25 SURGERY — ESOPHAGOGASTRODUODENOSCOPY (EGD) WITH PROPOFOL
Anesthesia: Monitor Anesthesia Care

## 2018-07-25 MED ORDER — INSULIN ASPART 100 UNIT/ML ~~LOC~~ SOLN
5.0000 [IU] | Freq: Once | SUBCUTANEOUS | Status: AC
  Administered 2018-07-25: 5 [IU] via SUBCUTANEOUS

## 2018-07-25 MED ORDER — INSULIN ASPART 100 UNIT/ML ~~LOC~~ SOLN
SUBCUTANEOUS | Status: AC
  Filled 2018-07-25: qty 1

## 2018-07-25 MED ORDER — PROPOFOL 10 MG/ML IV BOLUS
INTRAVENOUS | Status: DC | PRN
  Administered 2018-07-25: 40 mg via INTRAVENOUS

## 2018-07-25 MED ORDER — PROPOFOL 10 MG/ML IV BOLUS
INTRAVENOUS | Status: AC
  Filled 2018-07-25: qty 20

## 2018-07-25 MED ORDER — PROPOFOL 500 MG/50ML IV EMUL
INTRAVENOUS | Status: DC | PRN
  Administered 2018-07-25: 140 ug/kg/min via INTRAVENOUS

## 2018-07-25 MED ORDER — PROPOFOL 10 MG/ML IV BOLUS
INTRAVENOUS | Status: AC
  Filled 2018-07-25: qty 60

## 2018-07-25 MED ORDER — LIDOCAINE HCL (CARDIAC) PF 100 MG/5ML IV SOSY
PREFILLED_SYRINGE | INTRAVENOUS | Status: DC | PRN
  Administered 2018-07-25: 100 mg via INTRAVENOUS

## 2018-07-25 MED ORDER — LACTATED RINGERS IV SOLN
INTRAVENOUS | Status: DC
  Administered 2018-07-25: 10:00:00 via INTRAVENOUS

## 2018-07-25 MED ORDER — PANTOPRAZOLE SODIUM 40 MG PO TBEC: 40.0000 mg | DELAYED_RELEASE_TABLET | Freq: Two times a day (BID) | ORAL | 3 refills | Status: DC

## 2018-07-25 MED ORDER — SODIUM CHLORIDE 0.9 % IV SOLN: INTRAVENOUS | Status: DC

## 2018-07-25 MED ORDER — PROPOFOL 10 MG/ML IV BOLUS
INTRAVENOUS | Status: AC
  Filled 2018-07-25: qty 40

## 2018-07-25 MED ORDER — GLYCOPYRROLATE PF 0.2 MG/ML IJ SOSY
PREFILLED_SYRINGE | INTRAMUSCULAR | Status: DC | PRN
  Administered 2018-07-25: .1 mg via INTRAVENOUS

## 2018-07-25 SURGICAL SUPPLY — 14 items

## 2018-07-25 NOTE — H&P (Signed)
GASTROENTEROLOGY OUTPATIENT PROCEDURE H&P NOTE   Primary Care Physician: [facility]  HPI: Julie Robertson is a 43 y.o. female who presents for EGD with side-viewing examination.  Past Medical History:  Diagnosis Date  . Diabetes mellitus without complication ([place])   . Diabetic neuropathy ([place]) 12/2013   vs carpal tunnell.  numbness tingling in right fingers. rx with Gabapentin.  Marland Kitchen Dyslipidemia 08/2009  . Gall stones 08/2009  . Obesity    BMI 41, 250# 03/2015   Past Surgical History:  Procedure Laterality Date  . CHOLECYSTECTOMY N/A 03/10/2015   Procedure: LAPAROSCOPIC CHOLECYSTECTOMY WITH INTRAOPERATIVE CHOLANGIOGRAM;  Surgeon: Georganna Skeans, MD;  Location: [place];  Service: General;  Laterality: N/A;  . ERCP N/A 03/11/2015   Procedure: ENDOSCOPIC RETROGRADE CHOLANGIOPANCREATOGRAPHY (ERCP);  Surgeon: Milus Banister, MD;  Location: [place];  Service: Endoscopy;  Laterality: N/A;  . TUBAL LIGATION     No current facility-administered medications for this encounter.    No Known Allergies Family History  Problem Relation Age of Onset  . Diabetes Mother   . Heart disease Mother   . Hypertension Mother   . Diabetes Father   . Bone cancer Maternal Grandfather   . Kidney disease Maternal Uncle   . Other Son        had kidney removed due to gun shot wound   Social History   Socioeconomic History  . Marital status: Divorced    Spouse name: Not on file  . Number of children: 2  . Years of education: Not on file  . Highest education level: Not on file  Occupational History  . Occupation: Development worker, community  Social Needs  . Financial resource strain: Not on file  . Food insecurity:    Worry: Not on file    Inability: Not on file  . Transportation needs:    Medical: Not on file    Non-medical: Not on file  Tobacco Use  . Smoking status: Current Every Day Smoker    Packs/day: 1.50    Years: 4.00    Pack years: 6.00    Types: Cigarettes  .  Smokeless tobacco: Never Used  Substance and Sexual Activity  . Alcohol use: Yes    Comment: rare  . Drug use: No  . Sexual activity: Not on file  Lifestyle  . Physical activity:    Days per week: Not on file    Minutes per session: Not on file  . Stress: Not on file  Relationships  . Social connections:    Talks on phone: Not on file    Gets together: Not on file    Attends religious service: Not on file    Active member of club or organization: Not on file    Attends meetings of clubs or organizations: Not on file    Relationship status: Not on file  . Intimate partner violence:    Fear of current or ex partner: Not on file    Emotionally abused: Not on file    Physically abused: Not on file    Forced sexual activity: Not on file  Other Topics Concern  . Not on file  Social History Narrative   Patient has 2 sons one is 29, the other 60 as of 03/2015. Her kids are not the children of her current husband. As of 03/2015 she is not employed outside the home.    Physical Exam: Vital signs in last 24 hours:     GEN: NAD EYE:  Sclerae anicteric ENT: MMM CV: RR without R/Gs  RESP: CTAB posteriorly GI: Soft, NT/ND NEURO:  Alert & Oriented x 3  Lab Results: No results for input(s): WBC, HGB, HCT, PLT in the last 72 hours. BMET No results for input(s): NA, K, CL, CO2, GLUCOSE, BUN, CREATININE, CALCIUM in the last 72 hours. LFT No results for input(s): PROT, ALBUMIN, AST, ALT, ALKPHOS, BILITOT, BILIDIR, IBILI in the last 72 hours. PT/INR No results for input(s): LABPROT, INR in the last 72 hours.   Impression / Plan: This is a 43 y.o.female who presents for EGD with side-viewing endoscopy.  The risks and benefits of endoscopic evaluation were discussed with the patient; these include but are not limited to the risk of perforation, infection, bleeding, missed lesions, lack of diagnosis, severe illness requiring hospitalization, as well as anesthesia and sedation related  illnesses.  The patient is agreeable to proceed.    Justice Britain, MD [facility] Advanced [facility]oscopy Office # [phone]

## 2018-07-25 NOTE — Anesthesia Postprocedure Evaluation (Addendum)
Anesthesia Post Note  Patient: Julie Robertson  Procedure(s) Performed: ESOPHAGOGASTRODUODENOSCOPY (EGD) WITH PROPOFOL (N/A )     Patient location during evaluation: Endoscopy Anesthesia Type: MAC Level of consciousness: awake and alert Pain management: pain level controlled Vital Signs Assessment: post-procedure vital signs reviewed and stable Respiratory status: spontaneous breathing and respiratory function stable Cardiovascular status: stable Postop Assessment: no apparent nausea or vomiting Anesthetic complications: no    Last Vitals:  Vitals:   07/25/18 1115 07/25/18 1120  BP:  120/75  Pulse: 80 77  Resp: (!) 32 (!) 23  Temp:    SpO2: 98% 100%    Last Pain:  Vitals:   07/25/18 1115  TempSrc:   PainSc: 4                  Jamorian Dimaria DANIEL

## 2018-07-25 NOTE — Anesthesia Preprocedure Evaluation (Addendum)
Anesthesia Evaluation  Patient identified by MRN, date of birth, ID band Patient awake    Reviewed: Allergy & Precautions, NPO status , Patient's Chart, lab work & pertinent test results  History of Anesthesia Complications Negative for: history of anesthetic complications  Airway Mallampati: II  TM Distance: >3 FB Neck ROM: Full    Dental  (+) Dental Advisory Given, Partial Lower,    Pulmonary asthma , Current Smoker,    Pulmonary exam normal        Cardiovascular Exercise Tolerance: Good hypertension, Pt. on medications Normal cardiovascular exam     Neuro/Psych negative neurological ROS     GI/Hepatic negative GI ROS, Neg liver ROS,   Endo/Other  diabetesMorbid obesity  Renal/GU negative Renal ROS     Musculoskeletal negative musculoskeletal ROS (+)   Abdominal   Peds  Hematology  (+) Blood dyscrasia, anemia ,   Anesthesia Other Findings Day of surgery medications reviewed with the patient.  Reproductive/Obstetrics                            Anesthesia Physical  Anesthesia Plan  ASA: III  Anesthesia Plan: MAC   Post-op Pain Management:    Induction:   PONV Risk Score and Plan: Ondansetron and Propofol infusion  Airway Management Planned: Natural Airway  Additional Equipment:   Intra-op Plan:   Post-operative Plan:   Informed Consent: I have reviewed the patients History and Physical, chart, labs and discussed the procedure including the risks, benefits and alternatives for the proposed anesthesia with the patient or authorized representative who has indicated his/her understanding and acceptance.     Dental advisory given  Plan Discussed with: CRNA, Anesthesiologist and Surgeon  Anesthesia Plan Comments:        Anesthesia Quick Evaluation

## 2018-07-25 NOTE — Transfer of Care (Signed)
Immediate Anesthesia Transfer of Care Note  Patient: Julie Robertson  Procedure(s) Performed: ESOPHAGOGASTRODUODENOSCOPY (EGD) WITH PROPOFOL (N/A )  Patient Location: PACU and Endoscopy Unit  Anesthesia Type:MAC  Level of Consciousness: awake and drowsy  Airway & Oxygen Therapy: Patient Spontanous Breathing and Patient connected to nasal cannula oxygen  Post-op Assessment: Report given to RN and Post -op Vital signs reviewed and stable  Post vital signs: Reviewed and stable  Last Vitals:  Vitals Value Taken Time  BP    Temp    Pulse    Resp    SpO2      Last Pain:  Vitals:   07/25/18 1005  TempSrc: Oral  PainSc: 0-No pain         Complications: No apparent anesthesia complications

## 2018-07-25 NOTE — Addendum Note (Signed)
Addendum  created 07/25/18 1256 by Duane Boston, MD   Clinical Note Signed

## 2018-07-25 NOTE — Op Note (Addendum)
[facility] Patient Name: Julie Robertson Procedure Date: 07/25/2018 MRN: [id number] Attending MD: Justice Britain , MD Date of Birth: 1976/06/13 CSN: [id number] Age: 43 Admit Type: Outpatient Procedure:                Upper GI endoscopy Indications:              Surveillance procedure, Surveillance for malignancy                            due to personal history of premalignant condition,                            Postoperative assessment, Polyps in the duodenum,                            Follow-up of polyps in the duodenum Providers:                Justice Britain, MD, Cleda Daub, RN, Jeanella Cara, RN, William Dalton, Technician Referring MD:             Mauri Pole, MD Medicines:                Monitored Anesthesia Care Complications:            No immediate complications. Estimated Blood Loss:     Estimated blood loss: none. Procedure:                Pre-Anesthesia Assessment:                           - Prior to the procedure, a History and Physical                            was performed, and patient medications and                            allergies were reviewed. The patient's tolerance of                            previous anesthesia was also reviewed. The risks                            and benefits of the procedure and the sedation                            options and risks were discussed with the patient.                            All questions were answered, and informed consent                            was obtained. Prior Anticoagulants: The patient has  taken no previous anticoagulant or antiplatelet                            agents. ASA Grade Assessment: II - A patient with                            mild systemic disease. After reviewing the risks                            and benefits, the patient was deemed in                            satisfactory  condition to undergo the procedure.                           After obtaining informed consent, the endoscope was                            passed under direct vision. Throughout the                            procedure, the patient's blood pressure, pulse, and                            oxygen saturations were monitored continuously. The                            GIF-H190 ([id number]) Olympus gastroscope was                            introduced through the mouth, and advanced to the                            second part of duodenum. The [id number]-Q180V ([id number])                            Olympus duodenoscope was introduced through the                            mouth, and advanced to the second part of duodenum.                            The upper GI endoscopy was accomplished without                            difficulty. The patient tolerated the procedure. Scope In: Scope Out: Findings:      No gross lesions were noted in the entire esophagus.      The Z-line was regular and was found 36 cm from the incisors.      A single 14 mm papule (nodule) was found in the prepyloric region of the       stomach with a small erosion noted - previously biopsied.      Striped moderately erythematous mucosa without bleeding was found in the  gastric antrum.      No gross lesions were noted in the duodenal bulb.      There was evidence of a widely patent surgical ampullectomy; the biliary       orifice was patent and visible. The was characterized by healthy       appearing mucosa and no evidence of recurrent polypoid tissue.      No other gross lesions were noted in the second portion of the duodenum. Impression:               - No gross lesions in esophagus. Z-line regular, 36                            cm from the incisors.                           - A single papule (nodule) found in the stomach -                            previously biopsied with small erosion present.                            - Erythematous mucosa in the antrum.                           - No gross lesions in the duodenal bulb.                           - Widely patent ampullectomy, characterized by                            healthy appearing mucosa was found.                           - No other gross lesions in the second portion of                            the duodenum. Moderate Sedation:      Not Applicable - Patient had care per Anesthesia. Recommendation:           - The patient will be observed post-procedure,                            until all discharge criteria are met.                           - Discharge patient to home.                           - Patient has a contact number available for                            emergencies. The signs and symptoms of potential                            delayed complications were discussed with the  patient. Return to normal activities tomorrow.                            Written discharge instructions were provided to the                            patient.                           - Resume previous diet.                           - Would increase PPI to 40 mg BID for next 2-3                            months.                           - Minimize NSAIDs as much as able (Medication list                            includes Naproxen).                           - Repeat EGD with side-viewing endoscopy in 2-3                            years for surveillance.                           - Will set up follow up with Dr. Silverio Decamp in                            regards to her GI symptoms but also in regards to                            considering changing dose of PPI after a period to                            see if any changes occur in the prepyloric                            nodularity. If persistence, then consider possible                            role of EUS in future or cross-sectional imaging to                             see if anything is found in the region.                           - The findings and recommendations were discussed  with the patient.                           - The findings and recommendations were discussed                            with the patient's family. Procedure Code(s):        --- Professional ---                           [phone], Esophagogastroduodenoscopy, flexible,                            transoral; diagnostic, including collection of                            specimen(s) by brushing or washing, when performed                            (separate procedure) Diagnosis Code(s):        --- Professional ---                           K31.89, Other diseases of stomach and duodenum                           Z98.890, Other specified postprocedural states                           Z87.19, Personal history of other diseases of the                            digestive system                           Z09, Encounter for follow-up examination after                            completed treatment for conditions other than                            malignant neoplasm                           K31.7, Polyp of stomach and duodenum CPT copyright 2018 American Medical Association. All rights reserved. The codes documented in this report are preliminary and upon coder review may  be revised to meet current compliance requirements. Justice Britain, MD 07/25/2018 11:07:46 AM Number of Addenda: 0

## 2018-07-25 NOTE — Discharge Instructions (Signed)
YOU HAD AN ENDOSCOPIC PROCEDURE TODAY: Refer to the procedure report and other information in the discharge instructions given to you for any specific questions about what was found during the examination. If this information does not answer your questions, please call [place] office at [phone] to clarify.   YOU SHOULD EXPECT: Some feelings of bloating in the abdomen. Passage of more gas than usual. Walking can help get rid of the air that was put into your GI tract during the procedure and reduce the bloating.  DIET: Your first meal following the procedure should be a light meal and then it is ok to progress to your normal diet. A half-sandwich or bowl of soup is an example of a good first meal. Heavy or fried foods are harder to digest and may make you feel nauseous or bloated. Drink plenty of fluids but you should avoid alcoholic beverages for 24 hours. If you had a esophageal dilation, please see attached instructions for diet.    ACTIVITY: Your care partner should take you home directly after the procedure. You should plan to take it easy, moving slowly for the rest of the day. You can resume normal activity the day after the procedure however YOU SHOULD NOT DRIVE, use power tools, machinery or perform tasks that involve climbing or major physical exertion for 24 hours (because of the sedation medicines used during the test).   SYMPTOMS TO REPORT IMMEDIATELY: A gastroenterologist can be reached at any hour. Please call [phone]  for any of the following symptoms:  Following upper endoscopy (EGD, EUS, ERCP, esophageal dilation) Vomiting of blood or coffee ground material  New, significant abdominal pain  New, significant chest pain or pain under the shoulder blades  Painful or persistently difficult swallowing  New shortness of breath  Black, tarry-looking or red, bloody stools  FOLLOW UP:  If any biopsies were taken you will be contacted by phone or by letter within the next 1-3  weeks. Call [phone]  if you have not heard about the biopsies in 3 weeks.  Please also call with any specific questions about appointments or follow up tests.  Endoscopa alta en los adultos, cuidados posteriores Upper Endoscopy, Adult, Care After Esta hoja le brinda informacin sobre cmo cuidarse despus del procedimiento. El mdico tambin podr darle indicaciones ms especficas. Comunquese con el mdico si tiene problemas o preguntas. Qu puedo esperar despus del procedimiento? Despus del procedimiento, es comn [employer] siguientes sntomas:  Dolor de Investment banker, operational.  Leve dolor o molestias en el estmago.  Meteorismo.  Nuseas. Siga estas indicaciones en su casa:   Siga las indicaciones del mdico respecto de qu comer o beber despus del procedimiento.  Retome sus actividades normales como se lo haya indicado el mdico. Pregntele al mdico qu actividades son seguras para usted.  Tome los medicamentos de venta libre y los recetados solamente como se lo haya indicado el mdico.  No conduzca durante 24horas si le administraron un sedante durante el procedimiento.  Concurra a todas las visitas de seguimiento como se lo haya indicado el mdico. Esto es importante. Comunquese con un mdico si tiene:  Dolor de garganta que dura ms de Optician, dispensing.  Dificultad para tragar. Solicite ayuda inmediatamente si:  Vomita sangre o el vmito tiene un aspecto similar al poso del caf.  Tiene los siguientes sntomas: ? Cristy Hilts. ? Heces con sangre o de aspecto negro alquitranado. ? Dolor de garganta muy intenso o no puede tragar. ? Dificultad para respirar. ? Dolorintenso en el  pecho o el abdomen. Resumen  Despus del procedimiento, es frecuente sentir dolor de garganta, molestias leves en el estmago, distensin abdominal y nuseas.  No conduzca durante 24horas si le administraron un sedante durante el procedimiento.  Siga las indicaciones del mdico respecto de qu comer o  beber despus del procedimiento.  Retome sus actividades normales como se lo haya indicado el mdico. Esta informacin no tiene Marine scientist el consejo del mdico. Asegrese de hacerle al mdico cualquier pregunta que tenga. Document Released: 12/20/2011 Document Revised: 12/28/2017 Document Reviewed: 12/28/2017 Elsevier Interactive Patient Education  2019 [employer].

## 2018-09-27 ENCOUNTER — Telehealth: Payer: Self-pay

## 2018-09-27 NOTE — Telephone Encounter (Signed)
Please advise via telephone.Marland KitchenMarland Kitchen

## 2018-09-27 NOTE — Telephone Encounter (Signed)
Patient called wanting to schedule an appointment because she is a diabetic and that her throat and ears hurt. Patient states she has a fever as well. Patient would like to know if she can be seen.  Please follow up.

## 2018-09-27 NOTE — Telephone Encounter (Signed)
Please have patient go to urgent care to be seen due to her fever as well as her other acute symptoms

## 2018-09-28 NOTE — Telephone Encounter (Signed)
Attempt to call patient. No answer. Left message on voicemail.

## 2018-09-28 NOTE — Telephone Encounter (Signed)
Can you follow-up on if patient was instructed to go to urgent care

## 2018-09-29 ENCOUNTER — Ambulatory Visit (HOSPITAL_COMMUNITY)
Admission: EM | Admit: 2018-09-29 | Discharge: 2018-09-29 | Disposition: A | Discharge status: Home / Self Care | Payer: Medicaid Other | Attending: Family Medicine | Admitting: Family Medicine

## 2018-09-29 ENCOUNTER — Other Ambulatory Visit: Payer: Self-pay

## 2018-09-29 ENCOUNTER — Encounter (HOSPITAL_COMMUNITY): Payer: Self-pay

## 2018-09-29 DIAGNOSIS — J069 Acute upper respiratory infection, unspecified: Secondary | ICD-10-CM

## 2018-09-29 MED ORDER — AMOXICILLIN-POT CLAVULANATE 875-125 MG PO TABS: 1.0000 | ORAL_TABLET | Freq: Two times a day (BID) | ORAL | 0 refills | Status: AC

## 2018-09-29 MED ORDER — BENZONATATE 100 MG PO CAPS: 100.0000 mg | ORAL_CAPSULE | Freq: Three times a day (TID) | ORAL | 0 refills | Status: DC | PRN

## 2018-09-29 MED ORDER — LORATADINE 10 MG PO TABS: 10.0000 mg | ORAL_TABLET | Freq: Every day | ORAL | 0 refills | Status: DC

## 2018-09-29 MED ORDER — IPRATROPIUM BROMIDE 0.03 % NA SOLN: 2.0000 | Freq: Two times a day (BID) | NASAL | 0 refills | Status: DC

## 2018-09-29 NOTE — ED Provider Notes (Signed)
[place]    CSN: [id number] Arrival date & time: 09/29/18  1505     History   Chief Complaint Chief Complaint  Patient presents with  . Sore Throat    HPI Julie Robertson is a 43 y.o. female.  [employer] Interpreter Service used for encounter  HPI  Complains of cough, sore throat, and ear pain ongoing for 2-3 days. She reports symptoms have recently worsened and cough is more persistent. Endorses an occasional wheeze with non-productive cough. She endorses ear pain bilaterally. She is uncertain of fever, although endorses feeling warm. She denies shortness of breath, wheezing, or dizziness. Endorses good fluid hydration and poor food intake. She has a history of asthma, endorses no recent use of albuterol.  Past Medical History:  Diagnosis Date  . Diabetes mellitus without complication ([place])   . Diabetic neuropathy ([place]) 12/2013   vs carpal tunnell.  numbness tingling in right fingers. rx with Gabapentin.  Marland Kitchen Dyslipidemia 08/2009  . Gall stones 08/2009  . Obesity    BMI 41, 250# 03/2015    Patient Active Problem List   Diagnosis Date Noted  . Uncontrolled type 2 diabetes mellitus with hyperglycemia, with long-term current use of insulin ([place]) 04/12/2017  . Essential hypertension 04/12/2017  . Morbid (severe) obesity due to excess calories ([place]) 10/18/2016  . 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 Iron deficiency anemia 09/02/2015  . Asthma 08/28/2015  . Type 2 diabetes mellitus ([place]) 07/31/2015  . Hyperlipidemia 12/04/2013  . Numbness and tingling in right hand 12/04/2013  . Smoking 12/04/2013  . Elevated BP 12/04/2013  . Pap smear, high-risk (screening, no prior abnormality) 02/13/2013  . Exposure to STD 02/13/2013    Past Surgical History:  Procedure Laterality Date  . CHOLECYSTECTOMY N/A 03/10/2015   Procedure: LAPAROSCOPIC CHOLECYSTECTOMY WITH INTRAOPERATIVE CHOLANGIOGRAM;  Surgeon: Georganna Skeans, MD;  Location: [facility];  Service: General;  Laterality:  N/A;  . ERCP N/A 03/11/2015   Procedure: ENDOSCOPIC RETROGRADE CHOLANGIOPANCREATOGRAPHY (ERCP);  Surgeon: Milus Banister, MD;  Location: [facility];  Service: Endoscopy;  Laterality: N/A;  . ESOPHAGOGASTRODUODENOSCOPY (EGD) WITH PROPOFOL N/A 07/25/2018   Procedure: ESOPHAGOGASTRODUODENOSCOPY (EGD) WITH PROPOFOL;  Surgeon: Rush Landmark Telford Nab., MD;  Location: [facility];  Service: Gastroenterology;  Laterality: N/A;  . TUBAL LIGATION      OB History   No obstetric history on file.      Home Medications    Prior to Admission medications   Medication Sig Start Date End Date Taking? Authorizing Provider  albuterol (PROVENTIL HFA;VENTOLIN HFA) 108 (90 Base) MCG/ACT inhaler Inhale 2 puffs into the lungs every 6 (six) hours as needed for wheezing or shortness of breath. 04/19/18   Fulp, Cammie, MD  atorvastatin (LIPITOR) 40 MG tablet Take 1 tablet (40 mg total) by mouth daily. Patient taking differently: Take 40 mg by mouth at bedtime.  04/19/18   Fulp, Cammie, MD  Blood Glucose Monitoring Suppl (TRUE METRIX METER) w/Device KIT Use as directed 04/12/17   Alfonse Spruce, FNP  gabapentin (NEURONTIN) 300 MG capsule Take 1 capsule (300 mg total) by mouth 3 (three) times daily. Patient taking differently: Take 300 mg by mouth See admin instructions. Take 1 capsule (300 mg) by mouth up to 3 times daily for circulation. (typically twice daily) 03/22/18   Fulp, Cammie, MD  glipiZIDE (GLUCOTROL) 5 MG tablet Take 1 tablet (5 mg total) by mouth daily before breakfast. 04/19/18   Fulp, Cammie, MD  glucose blood (TRUE METRIX BLOOD GLUCOSE TEST) test strip  Use as instructed 03/22/18   Fulp, Cammie, MD  ibuprofen (ADVIL,MOTRIN) 200 MG tablet Take 400 mg by mouth every 6 (six) hours as needed for moderate pain.    [provider]  Insulin Glargine (LANTUS SOLOSTAR) 100 UNIT/ML Solostar Pen Inject 50 Units into the skin daily. Patient taking differently: Inject 50 Units into the skin at  bedtime.  04/19/18   Fulp, Cammie, MD  Insulin Pen Needle 31G X 5 MM MISC Inject 10 units subcutaneous at bedtime 04/28/16   Tresa Garter, MD  losartan (COZAAR) 25 MG tablet Take 1 tablet (25 mg total) by mouth daily. 04/19/18   Fulp, Cammie, MD  metFORMIN (GLUCOPHAGE-XR) 500 MG 24 hr tablet Take 2 tablets (1,000 mg total) by mouth 2 (two) times daily. Patient taking differently: Take 500 mg by mouth 2 (two) times daily.  04/19/18   Fulp, Cammie, MD  naproxen sodium (ALEVE) 220 MG tablet Take 220 mg by mouth 2 (two) times daily as needed (pain).    [provider]  pantoprazole (PROTONIX) 40 MG tablet Take 1 tablet (40 mg total) by mouth 2 (two) times daily. 07/25/18   Mansouraty, Telford Nab., MD  TRUEPLUS LANCETS 28G MISC Use as directed 03/22/18   Antony Blackbird, MD    Family History Family History  Problem Relation Age of Onset  . Diabetes Mother   . Heart disease Mother   . Hypertension Mother   . Diabetes Father   . Bone cancer Maternal Grandfather   . Kidney disease Maternal Uncle   . Other Son        had kidney removed due to gun shot wound    Social History Social History   Tobacco Use  . Smoking status: Current Every Day Smoker    Packs/day: 1.50    Years: 4.00    Pack years: 6.00    Types: Cigarettes  . Smokeless tobacco: Never Used  Substance Use Topics  . Alcohol use: Yes    Comment: rare  . Drug use: No     Allergies   Patient has no known allergies.   Review of Systems Review of Systems Pertinent negatives listed in HPI Physical Exam Triage Vital Signs ED Triage Vitals  Enc Vitals Group     BP 09/29/18 1547 (!) 122/57     Pulse Rate 09/29/18 1547 93     Resp 09/29/18 1547 17     Temp 09/29/18 1547 99.2 F (37.3 C)     Temp Source 09/29/18 1547 Oral     SpO2 09/29/18 1547 99 %     Weight --      Height --      Head Circumference --      Peak Flow --      Pain Score 09/29/18 1545 0     Pain Loc --      Pain Edu? --      Excl.  in [place]? --    No data found.  Updated Vital Signs BP (!) 122/57 (BP Location: Left Arm)   Pulse 93   Temp 99.2 F (37.3 C) (Oral)   Resp 17   SpO2 99%   Visual Acuity Right Eye Distance:   Left Eye Distance:   Bilateral Distance:    Right Eye Near:   Left Eye Near:    Bilateral Near:     Physical Exam General appearance: alert, well developed, well nourished, cooperative and in no distress Head: Normocephalic, without obvious abnormality, atraumatic ENT: bilateral nares  erythematous and edematous  Respiratory: Respirations even and unlabored, normal respiratory rate, persistent cough  Heart: rate and rhythm normal. No gallop or murmurs noted on exam  Extremities: No gross deformities Skin: Skin color, texture, turgor normal. No rashes seen  Psych: Appropriate mood and affect. Neurologic: Mental status: Alert, oriented to person, place, and time, thought content appropriate. UC Treatments / Results  Labs (all labs ordered are listed, but only abnormal results are displayed) Labs Reviewed - No data to display  EKG None  Radiology No results found.  Procedures Procedures (including critical care time)  Medications Ordered in UC Medications - No data to display  Initial Impression / Assessment and Plan / UC Course  I have reviewed the triage vital signs and the nursing notes.  Pertinent labs & imaging results that were available during my care of the patient were reviewed by me and considered in my medical decision making (see chart for details).    Patient is a 43 year old female with a history of diabetes and asthma presents with a complaint of URI symptoms. Will treat symptomatically for now, however will also prescribe an antibiotic to start if symptoms do not improve within 72 hours. Red Flags discussed. Patient verbalized understanding and agreement with plan. Final Clinical Impressions(s) / UC Diagnoses   Final diagnoses:  Upper respiratory tract infection,  unspecified type   Discharge Instructions   None    ED Prescriptions    Medication Sig Dispense Auth. Provider   amoxicillin-clavulanate (AUGMENTIN) 875-125 MG tablet Take 1 tablet by mouth 2 (two) times daily for 10 days. 20 tablet Scot Jun, FNP   ipratropium (ATROVENT) 0.03 % nasal spray Place 2 sprays into both nostrils 2 (two) times daily. 30 mL Scot Jun, FNP   loratadine (CLARITIN) 10 MG tablet Take 1 tablet (10 mg total) by mouth daily. 30 tablet Scot Jun, FNP   benzonatate (TESSALON) 100 MG capsule Take 1-2 capsules (100-200 mg total) by mouth 3 (three) times daily as needed for cough. 60 capsule Scot Jun, FNP     Controlled Substance Prescriptions [place] Controlled Substance Registry consulted? Not Applicable    Scot Jun, [place] 09/30/18 [id number]

## 2018-09-29 NOTE — ED Triage Notes (Signed)
Patient presents to Urgent Care with complaints of sore throat and ear pain, and also nasal congestion worse at night since 2-3 days. Patient states she feels feverish, has been taking medication for her cough but ran out and cannot find any more in the pharmacy.

## 2018-09-29 NOTE — ED Notes (Signed)
Patient requesting [id number] testing at this time. Informed pt that this facility is not testing people for COVID at this time. Patient also reports she cannot get any more of her diabetes medications from the pharmacy and needs them to be mailed to her. Interpreting services used.

## 2018-09-29 NOTE — ED Notes (Signed)
Patient verbalizes understanding of discharge instructions. Opportunity for questioning and answers were provided. Patient discharged from [facility] by NP.  

## 2018-10-02 ENCOUNTER — Telehealth: Payer: Self-pay

## 2018-10-02 NOTE — Telephone Encounter (Signed)
Patient called to check on the status of their cafa letter please follow up.

## 2018-10-04 NOTE — Telephone Encounter (Signed)
Pt was informed that the [facility] dept is close at this time

## 2018-10-26 ENCOUNTER — Encounter: Payer: Self-pay | Admitting: Gastroenterology

## 2018-11-26 ENCOUNTER — Other Ambulatory Visit: Payer: Self-pay

## 2018-11-26 ENCOUNTER — Encounter (HOSPITAL_COMMUNITY): Payer: Self-pay | Admitting: Emergency Medicine

## 2018-11-26 ENCOUNTER — Emergency Department (HOSPITAL_COMMUNITY): Payer: Self-pay

## 2018-11-26 ENCOUNTER — Emergency Department (HOSPITAL_COMMUNITY)
Admission: EM | Admit: 2018-11-26 | Discharge: 2018-11-26 | Disposition: A | Discharge status: Home / Self Care | Payer: Self-pay | Attending: Emergency Medicine | Admitting: Emergency Medicine

## 2018-11-26 DIAGNOSIS — Z794 Long term (current) use of insulin: Secondary | ICD-10-CM | POA: Insufficient documentation

## 2018-11-26 DIAGNOSIS — F17210 Nicotine dependence, cigarettes, uncomplicated: Secondary | ICD-10-CM | POA: Insufficient documentation

## 2018-11-26 DIAGNOSIS — M79604 Pain in right leg: Secondary | ICD-10-CM | POA: Insufficient documentation

## 2018-11-26 DIAGNOSIS — Z79899 Other long term (current) drug therapy: Secondary | ICD-10-CM | POA: Insufficient documentation

## 2018-11-26 DIAGNOSIS — J45909 Unspecified asthma, uncomplicated: Secondary | ICD-10-CM | POA: Insufficient documentation

## 2018-11-26 DIAGNOSIS — E119 Type 2 diabetes mellitus without complications: Secondary | ICD-10-CM | POA: Insufficient documentation

## 2018-11-26 DIAGNOSIS — W19XXXA Unspecified fall, initial encounter: Secondary | ICD-10-CM

## 2018-11-26 LAB — CBG MONITORING, ED: Glucose-Capillary: 129 mg/dL — ABNORMAL HIGH (ref 70–99)

## 2018-11-26 IMAGING — CR DG HIP (WITH OR WITHOUT PELVIS) 2-3V RIGHT
3 series · 3 of 3 positions shown · non-contrast
Comparison: None.

CLINICAL DATA: 42-year-old female with a history of fall and right
ankle injury

EXAM:
DG HIP (WITH OR WITHOUT PELVIS) 2-3V RIGHT

[pelvis ap]
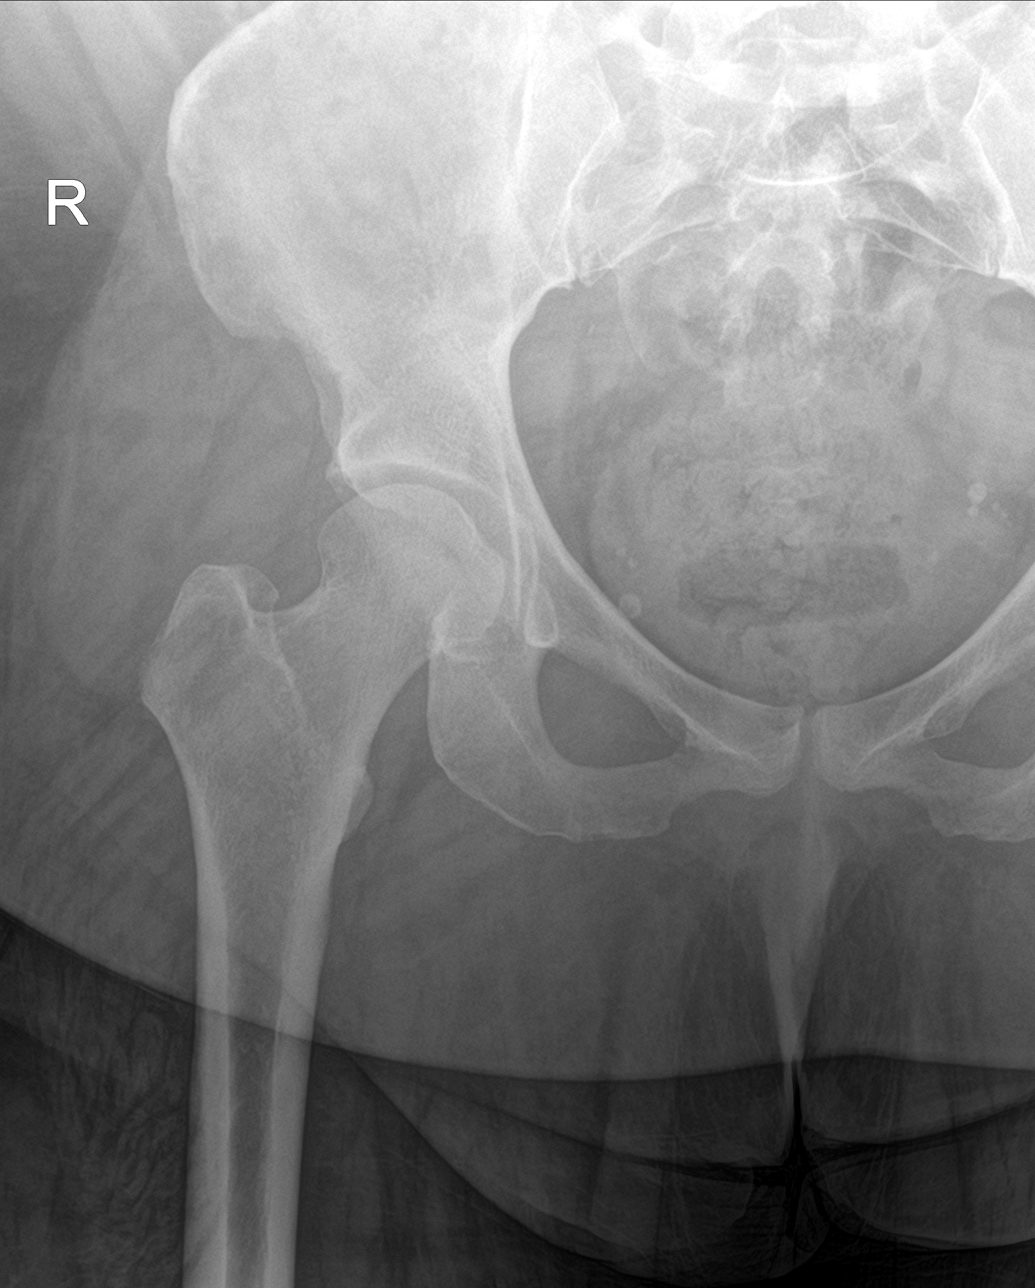

[hip ap]
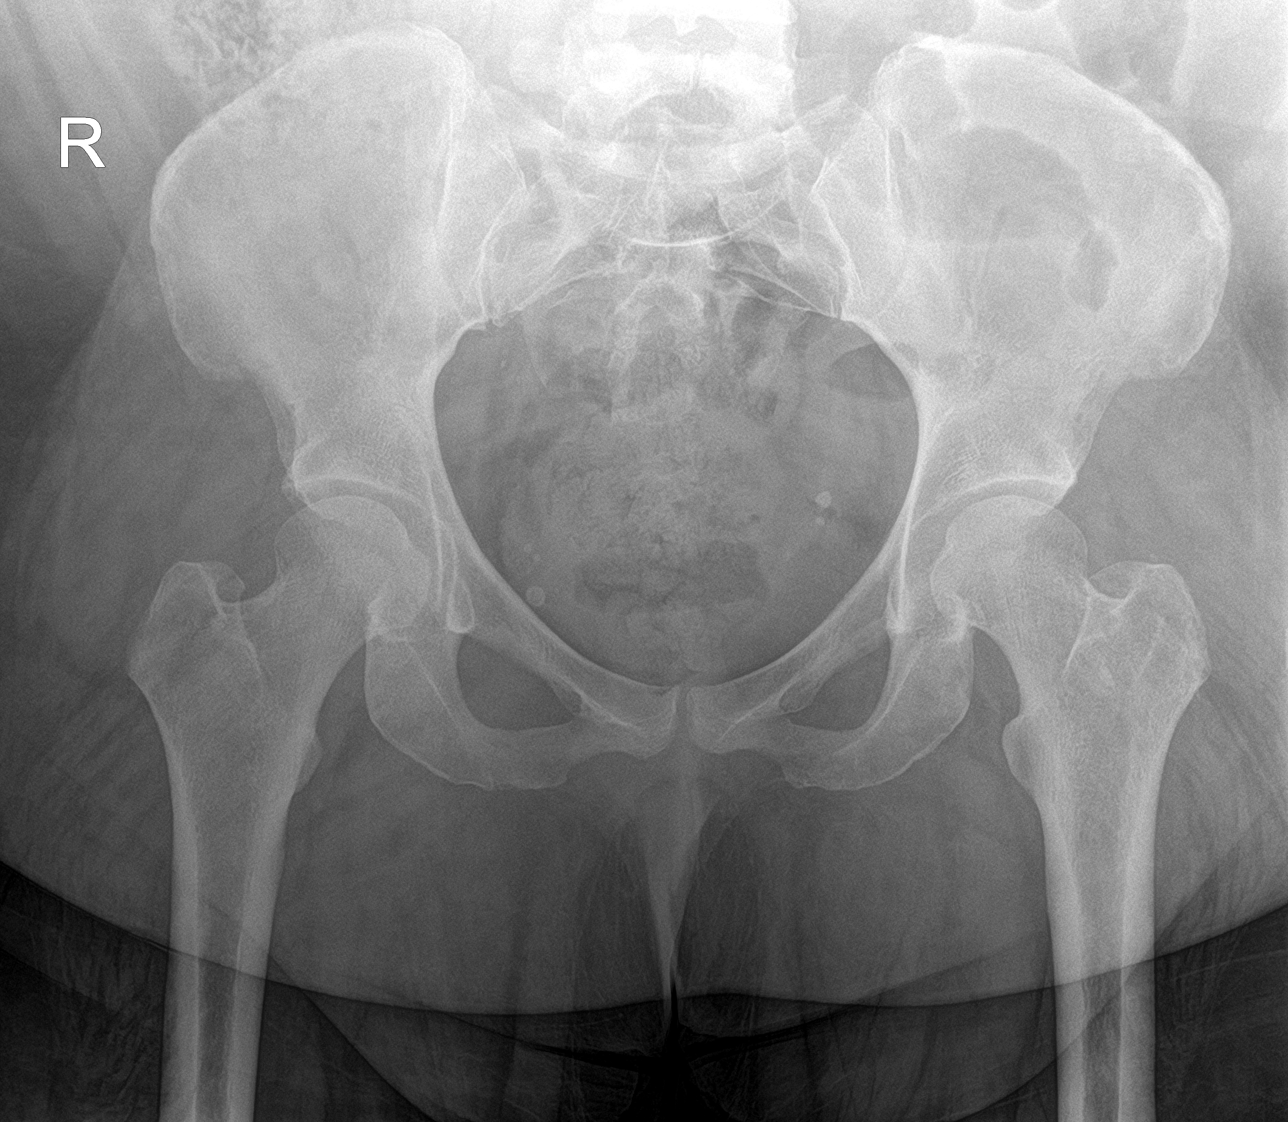

[hip lat]
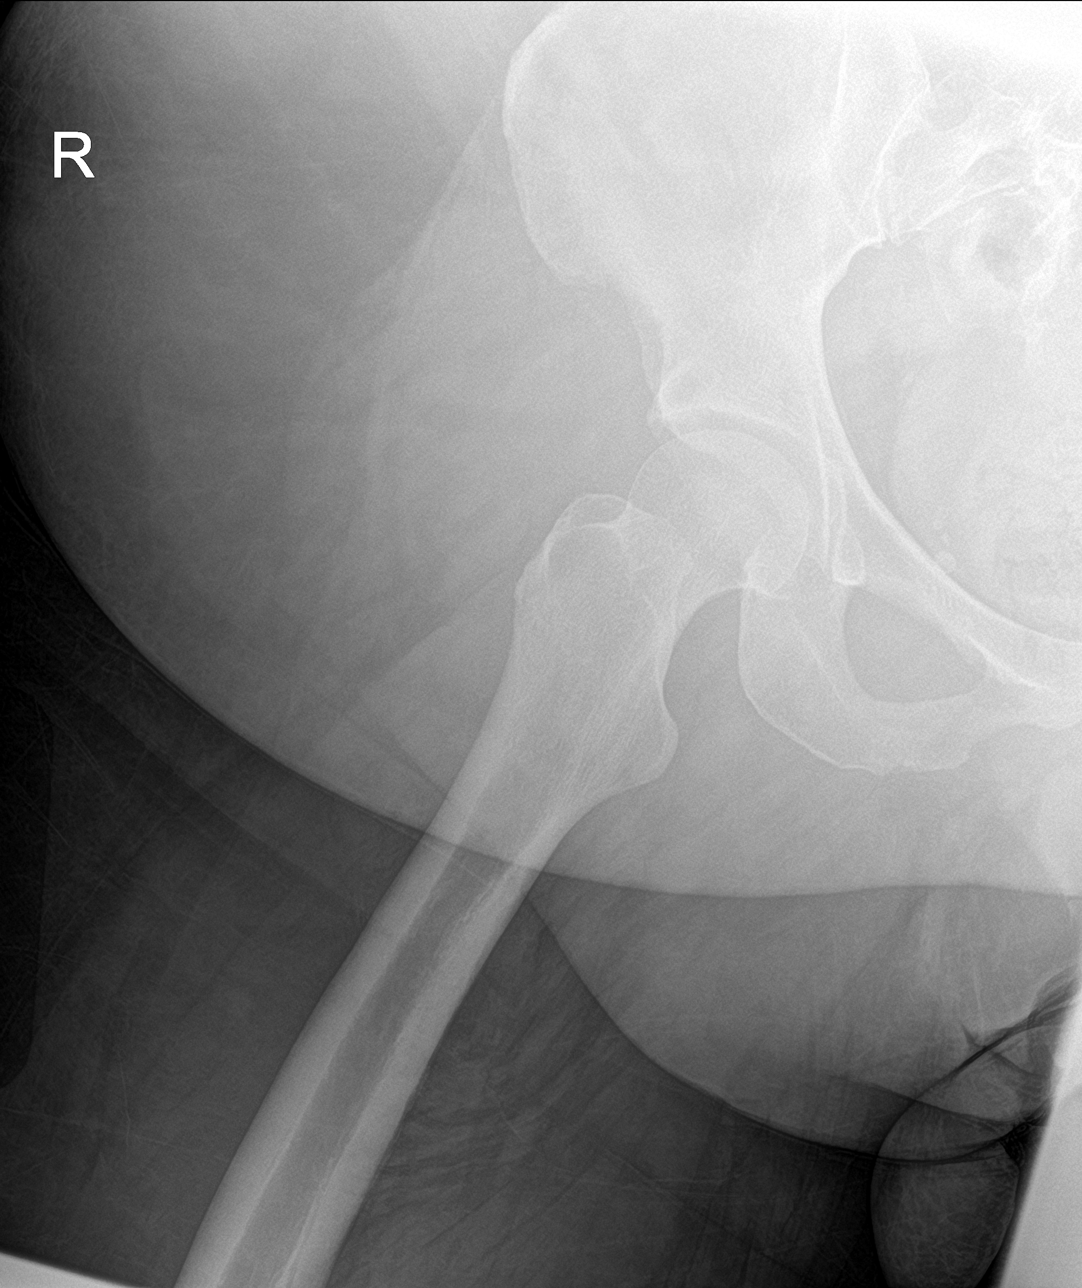

[3 of 3 positions shown; findings below may reference images not displayed]

FINDINGS: Bony pelvic ring intact. No acute displaced fracture. Bilateral hips
projects normally over the acetabula. No proximal femoral fracture
identified. Pelvic phleboliths. Minimal degenerative changes at the
hips.
IMPRESSION: Negative for acute bony abnormality

## 2018-11-26 IMAGING — CR RIGHT FOOT COMPLETE - 3+ VIEW
3 series · 3 of 3 positions shown · non-contrast
Comparison: None.

CLINICAL DATA: Fall, RIGHT foot pain.

EXAM:
RIGHT FOOT COMPLETE - 3+ VIEW

[foot ap]
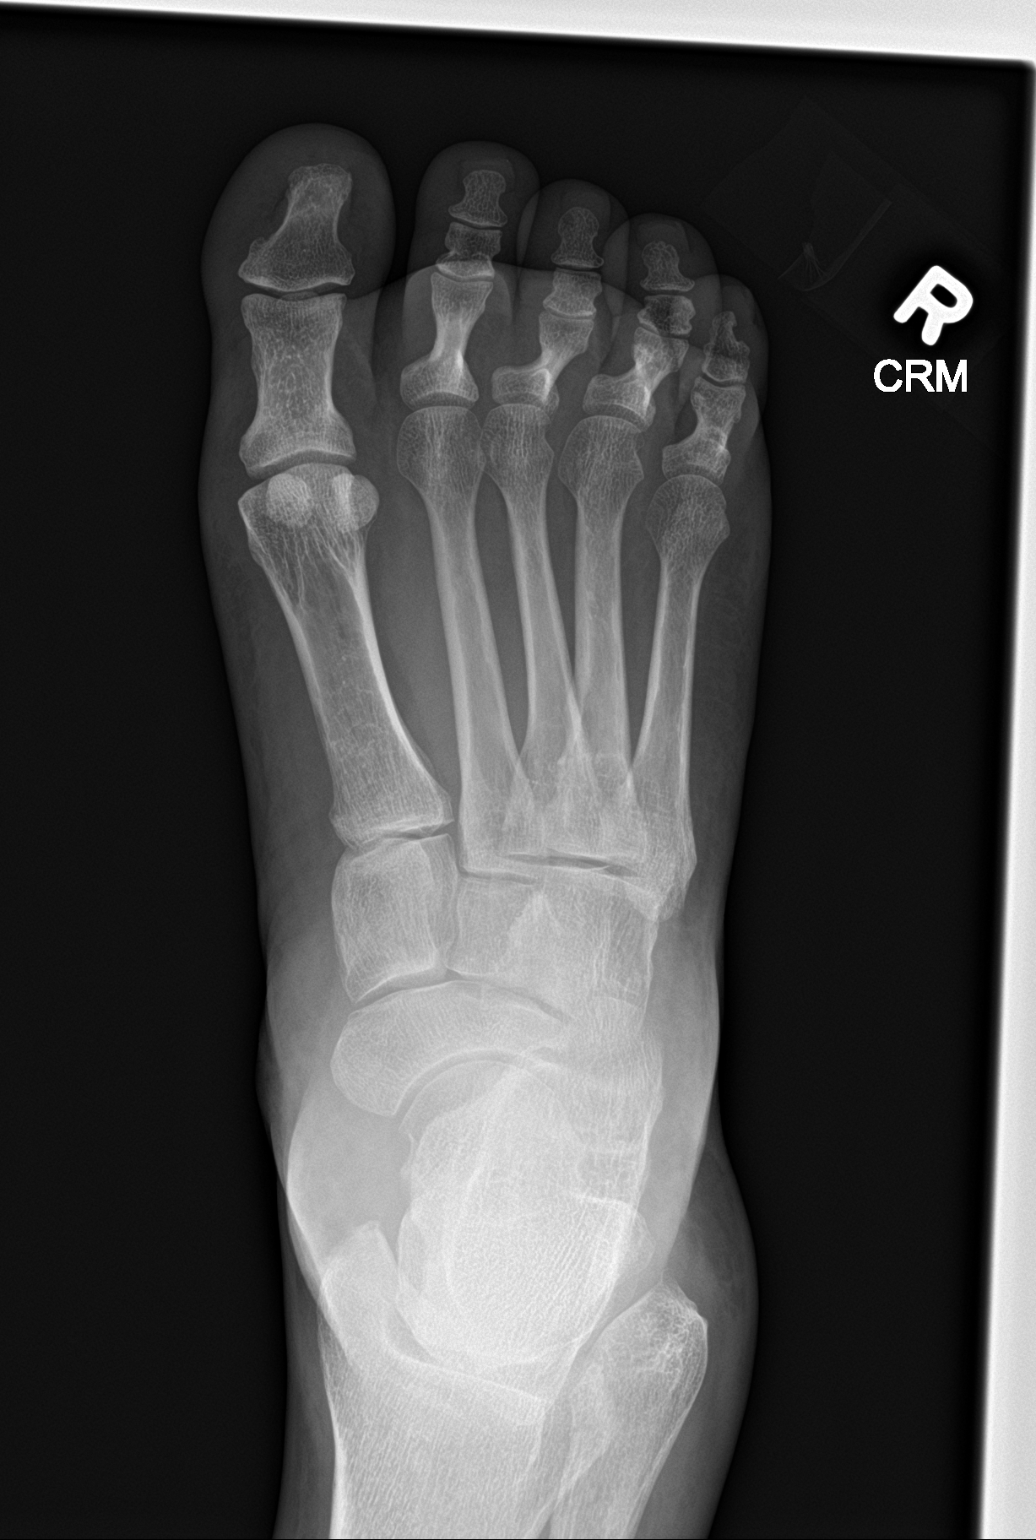

[foot obl]
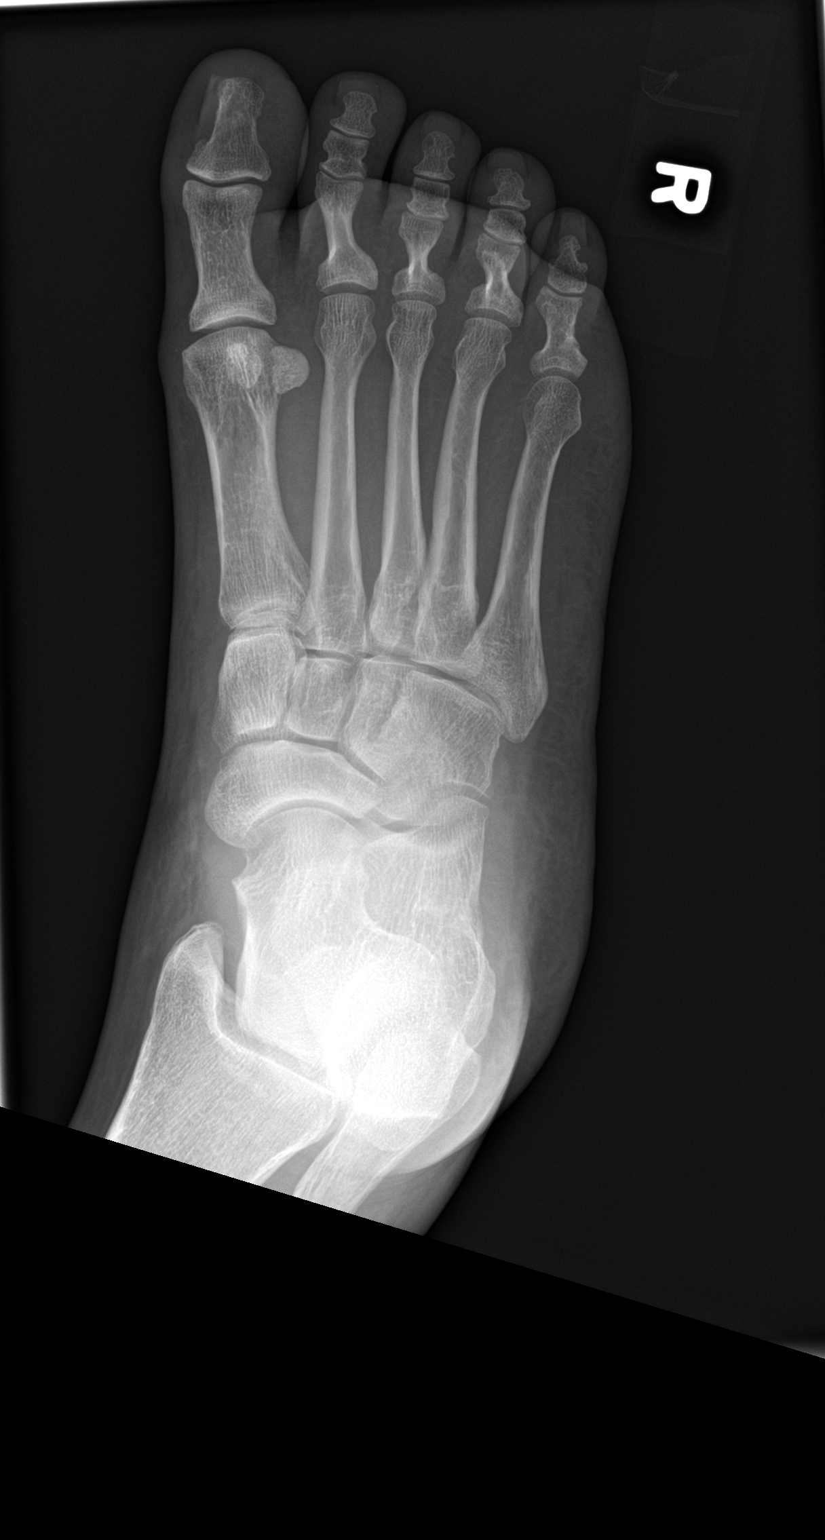

[foot lat]
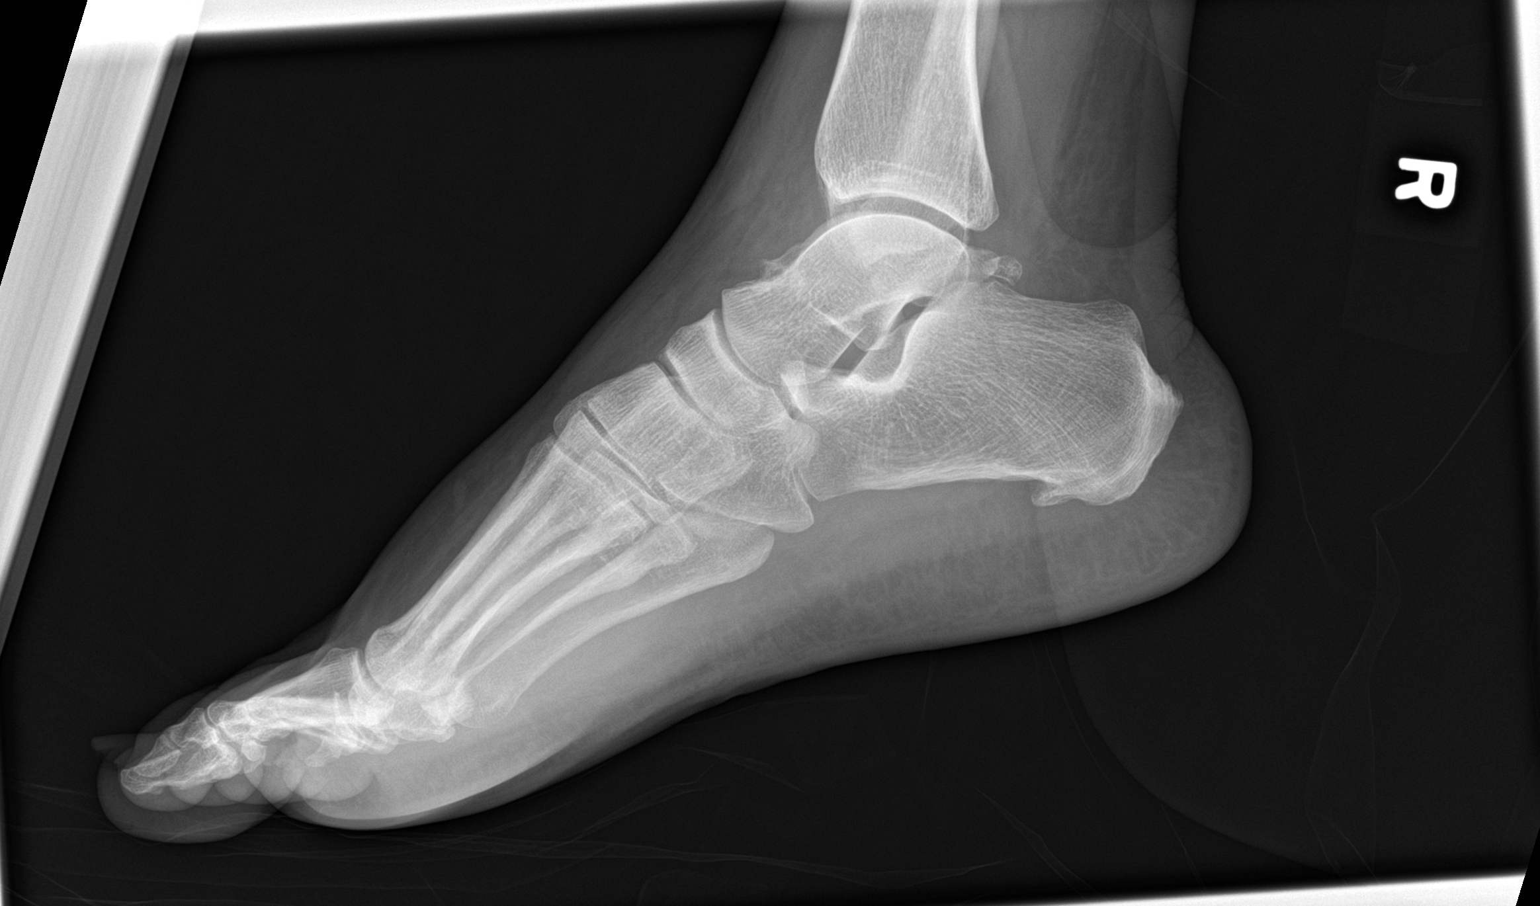

[3 of 3 positions shown; findings below may reference images not displayed]

FINDINGS: There is no evidence of fracture or dislocation. There is no
evidence of arthropathy or other focal bone abnormality. Soft
tissues are unremarkable.
IMPRESSION: Negative.

## 2018-11-26 MED ORDER — MELOXICAM 7.5 MG PO TABS: 7.5000 mg | ORAL_TABLET | Freq: Every day | ORAL | 0 refills | Status: DC

## 2018-11-26 MED ORDER — FENTANYL CITRATE (PF) 100 MCG/2ML IJ SOLN
25.0000 ug | Freq: Once | INTRAMUSCULAR | Status: AC
  Administered 2018-11-26: 25 ug via INTRAVENOUS
  Filled 2018-11-26: qty 2

## 2018-11-26 MED ORDER — METHOCARBAMOL 500 MG PO TABS: 500.0000 mg | ORAL_TABLET | Freq: Every evening | ORAL | 0 refills | Status: DC | PRN

## 2018-11-26 MED ORDER — SODIUM CHLORIDE 0.9 % IV BOLUS
500.0000 mL | Freq: Once | INTRAVENOUS | Status: AC
  Administered 2018-11-26: 14:00:00 500 mL via INTRAVENOUS

## 2018-11-26 NOTE — ED Notes (Signed)
Patient verbalizes understanding of discharge instructions . Opportunity for questions and answers were provided . Armband removed by staff ,Pt discharged from ED. W/C  offered at D/C  and Declined W/C at D/C and was escorted to lobby by RN.  

## 2018-11-26 NOTE — ED Triage Notes (Signed)
Pt slipped in ran yesterday and injured right ankle and right knee. Pt is unable to bear weight on leg without severe pain. Knee and ankle both tender to touch and swollen. Denies other symptoms. Did not hit head, No LOC. Interpreter used.

## 2018-11-26 NOTE — ED Provider Notes (Signed)
[facility] EMERGENCY DEPARTMENT Provider Note   CSN: [id number] Arrival date & time: 11/26/18  1220    History   Chief Complaint Chief Complaint  Patient presents with  . Knee Pain  . Ankle Pain    HPI Julie Robertson is a 43 y.o. female presenting evaluation of right leg pain after falling yesterday.  Patient states yesterday she was walking in the rain when she slipped, and her right leg went back behind her.  She reports acute onset of pain that starts in her right low back and extends to her foot.  Pain is constant, worse with palpation and movement.  She has been unable to walk due to pain.  She took 2 Aleve just prior to arrival without improvement of symptoms, she has not taken anything else for pain.  She denies numbness or tingling.  She denies hitting her head or loss of consciousness during the fall.  She denies neck or upper back pain.  She has a history of diabetes, obesity, dyslipidemia.  She takes medication for these, has no other medical problems.  Denies pain on her left side.  She has been using her son's cane to help her get around.      HPI  Past Medical History:  Diagnosis Date  . Diabetes mellitus without complication ([place])   . Diabetic neuropathy ([place]) 12/2013   vs carpal tunnell.  numbness tingling in right fingers. rx with Gabapentin.  Marland Kitchen Dyslipidemia 08/2009  . Gall stones 08/2009  . Obesity    BMI 41, 250# 03/2015    Patient Active Problem List   Diagnosis Date Noted  . Uncontrolled type 2 diabetes mellitus with hyperglycemia, with long-term current use of insulin ([place]) 04/12/2017  . Essential hypertension 04/12/2017  . Morbid (severe) obesity due to excess calories ([place]) 10/18/2016  . 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 Iron deficiency anemia 09/02/2015  . Asthma 08/28/2015  . Type 2 diabetes mellitus ([place]) 07/31/2015  . Hyperlipidemia 12/04/2013  . Numbness and tingling in right hand 12/04/2013  . Smoking 12/04/2013  .  Elevated BP 12/04/2013  . Pap smear, high-risk (screening, no prior abnormality) 02/13/2013  . Exposure to STD 02/13/2013    Past Surgical History:  Procedure Laterality Date  . CHOLECYSTECTOMY N/A 03/10/2015   Procedure: LAPAROSCOPIC CHOLECYSTECTOMY WITH INTRAOPERATIVE CHOLANGIOGRAM;  Surgeon: Georganna Skeans, MD;  Location: [facility];  Service: General;  Laterality: N/A;  . ERCP N/A 03/11/2015   Procedure: ENDOSCOPIC RETROGRADE CHOLANGIOPANCREATOGRAPHY (ERCP);  Surgeon: Milus Banister, MD;  Location: [facility];  Service: Endoscopy;  Laterality: N/A;  . ESOPHAGOGASTRODUODENOSCOPY (EGD) WITH PROPOFOL N/A 07/25/2018   Procedure: ESOPHAGOGASTRODUODENOSCOPY (EGD) WITH PROPOFOL;  Surgeon: Rush Landmark Telford Nab., MD;  [facility];  Service: Gastroenterology;  Laterality: N/A;  . TUBAL LIGATION       OB History   No obstetric history on file.      Home Medications    Prior to Admission medications   Medication Sig Start Date End Date Taking? Authorizing Provider  albuterol (PROVENTIL HFA;VENTOLIN HFA) 108 (90 Base) MCG/ACT inhaler Inhale 2 puffs into the lungs every 6 (six) hours as needed for wheezing or shortness of breath. 04/19/18   Fulp, Cammie, MD  atorvastatin (LIPITOR) 40 MG tablet Take 1 tablet (40 mg total) by mouth daily. Patient taking differently: Take 40 mg by mouth at bedtime.  04/19/18   Fulp, Cammie, MD  benzonatate (TESSALON) 100 MG capsule Take 1-2 capsules (100-200 mg total) by mouth 3 (three) times  daily as needed for cough. 09/29/18   Scot Jun, FNP  Blood Glucose Monitoring Suppl (TRUE METRIX METER) w/Device KIT Use as directed 04/12/17   Alfonse Spruce, FNP  gabapentin (NEURONTIN) 300 MG capsule Take 1 capsule (300 mg total) by mouth 3 (three) times daily. Patient taking differently: Take 300 mg by mouth See admin instructions. Take 1 capsule (300 mg) by mouth up to 3 times daily for circulation. (typically twice daily) 03/22/18   Fulp, Cammie, MD   glipiZIDE (GLUCOTROL) 5 MG tablet Take 1 tablet (5 mg total) by mouth daily before breakfast. 04/19/18   Fulp, Cammie, MD  glucose blood (TRUE METRIX BLOOD GLUCOSE TEST) test strip Use as instructed 03/22/18   Fulp, Cammie, MD  ibuprofen (ADVIL,MOTRIN) 200 MG tablet Take 400 mg by mouth every 6 (six) hours as needed for moderate pain.    [provider]  Insulin Glargine (LANTUS SOLOSTAR) 100 UNIT/ML Solostar Pen Inject 50 Units into the skin daily. Patient taking differently: Inject 50 Units into the skin at bedtime.  04/19/18   Fulp, Cammie, MD  Insulin Pen Needle 31G X 5 MM MISC Inject 10 units subcutaneous at bedtime 04/28/16   Jegede, Olugbemiga E, MD  ipratropium (ATROVENT) 0.03 % nasal spray Place 2 sprays into both nostrils 2 (two) times daily. 09/29/18   Scot Jun, FNP  loratadine (CLARITIN) 10 MG tablet Take 1 tablet (10 mg total) by mouth daily. 09/29/18   Scot Jun, FNP  losartan (COZAAR) 25 MG tablet Take 1 tablet (25 mg total) by mouth daily. 04/19/18   Fulp, Cammie, MD  meloxicam (MOBIC) 7.5 MG tablet Take 1 tablet (7.5 mg total) by mouth daily. 11/26/18   Charlise Giovanetti, PA-C  metFORMIN (GLUCOPHAGE-XR) 500 MG 24 hr tablet Take 2 tablets (1,000 mg total) by mouth 2 (two) times daily. Patient taking differently: Take 500 mg by mouth 2 (two) times daily.  04/19/18   Fulp, Cammie, MD  methocarbamol (ROBAXIN) 500 MG tablet Take 1 tablet (500 mg total) by mouth at bedtime as needed for muscle spasms. 11/26/18   Franchot Pollitt, PA-C  naproxen sodium (ALEVE) 220 MG tablet Take 220 mg by mouth 2 (two) times daily as needed (pain).    [provider]  pantoprazole (PROTONIX) 40 MG tablet Take 1 tablet (40 mg total) by mouth 2 (two) times daily. 07/25/18   Mansouraty, Telford Nab., MD  TRUEPLUS LANCETS 28G MISC Use as directed 03/22/18   Antony Blackbird, MD    Family History Family History  Problem Relation Age of Onset  . Diabetes Mother   . Heart disease  Mother   . Hypertension Mother   . Diabetes Father   . Bone cancer Maternal Grandfather   . Kidney disease Maternal Uncle   . Other Son        had kidney removed due to gun shot wound    Social History Social History   Tobacco Use  . Smoking status: Current Every Day Smoker    Packs/day: 1.50    Years: 4.00    Pack years: 6.00    Types: Cigarettes  . Smokeless tobacco: Never Used  Substance Use Topics  . Alcohol use: Yes    Comment: rare  . Drug use: No     Allergies   Patient has no known allergies.   Review of Systems Review of Systems  Musculoskeletal: Positive for arthralgias and myalgias.  Neurological: Negative for numbness.  Hematological: Does not bruise/bleed easily.  All  other systems reviewed and are negative.    Physical Exam Updated Vital Signs BP 102/64   Pulse 68   Temp 98.9 F (37.2 C) (Oral)   Resp 16   SpO2 97%   Physical Exam Vitals signs and nursing note reviewed.  Constitutional:      General: She is not in acute distress.    Appearance: She is well-developed.     Comments: Appears nontoxic  HENT:     Head: Normocephalic and atraumatic.     Comments: No head trauma Eyes:     Extraocular Movements: Extraocular movements intact.     Conjunctiva/sclera: Conjunctivae normal.     Pupils: Pupils are equal, round, and reactive to light.  Neck:     Musculoskeletal: Normal range of motion and neck supple.  Cardiovascular:     Rate and Rhythm: Normal rate and regular rhythm.     Pulses: Normal pulses.  Pulmonary:     Effort: Pulmonary effort is normal. No respiratory distress.     Breath sounds: Normal breath sounds. No wheezing.  Abdominal:     General: There is no distension.     Palpations: Abdomen is soft. There is no mass.     Tenderness: There is no abdominal tenderness. There is no guarding or rebound.     Comments: No ttp of the abd  Musculoskeletal:        General: Swelling and tenderness present.     Comments: Tenderness  palpation of the left posterior hip and buttock.  Tenderness palpation of the lateral left thigh.  Generalized tenderness palpation of the knee, worse on the medial and lateral joint lines.  Minimal tenderness palpation of the right calf.  Swelling of the right lateral malleolus with tenderness over the area of swelling.  No deformity noted elsewhere.  Pedal pulses intact bilaterally.  Good cap refill bilaterally.  Patient able to wiggle toes without difficulty.  Sensation intact.  Patient states she is unable to move her leg due to pain.  After pain control, patient able to lift her leg, bend her knee, and move her ankle with minimal pain.  Skin:    General: Skin is warm and dry.     Capillary Refill: Capillary refill takes less than 2 seconds.  Neurological:     Mental Status: She is alert and oriented to person, place, and time.      ED Treatments / Results  Labs (all labs ordered are listed, but only abnormal results are displayed) Labs Reviewed  CBG MONITORING, ED - Abnormal; Notable for the following components:      Result Value   Glucose-Capillary 129 (*)    All other components within normal limits    EKG None  Radiology Dg Ankle Complete Right  Result Date: 11/26/2018 CLINICAL DATA:  Fall yesterday, RIGHT ankle pain. EXAM: RIGHT ANKLE - COMPLETE 3+ VIEW COMPARISON:  None. FINDINGS: Osseous alignment is normal. Ankle mortise is symmetric. No fracture line or displaced fracture fragment seen. No degenerative change. Visualized portions of the hindfoot and midfoot appear intact and normally aligned. Incidental note made of spurring at the plantar margin of the calcaneus. IMPRESSION: No acute findings. No osseous fracture or dislocation. Electronically Signed   By: Franki Cabot M.D.   On: 11/26/2018 13:30   Dg Knee Complete 4 Views Right  Result Date: 11/26/2018 CLINICAL DATA:  Fall, RIGHT knee pain. EXAM: RIGHT KNEE - COMPLETE 4+ VIEW COMPARISON:  None. FINDINGS: No evidence of  fracture, dislocation, or joint  effusion. No evidence of arthropathy or other focal bone abnormality. Soft tissues are unremarkable. IMPRESSION: Negative. Electronically Signed   By: Franki Cabot M.D.   On: 11/26/2018 13:29   Dg Foot Complete Right  Result Date: 11/26/2018 CLINICAL DATA:  Fall, RIGHT foot pain. EXAM: RIGHT FOOT COMPLETE - 3+ VIEW COMPARISON:  None. FINDINGS: There is no evidence of fracture or dislocation. There is no evidence of arthropathy or other focal bone abnormality. Soft tissues are unremarkable. IMPRESSION: Negative. Electronically Signed   By: Franki Cabot M.D.   On: 11/26/2018 13:33   Dg Hip Unilat W Or Wo Pelvis 2-3 Views Right  Result Date: 11/26/2018 CLINICAL DATA:  43 year old female with a history of fall and right ankle injury EXAM: DG HIP (WITH OR WITHOUT PELVIS) 2-3V RIGHT COMPARISON:  None. FINDINGS: Bony pelvic ring intact. No acute displaced fracture. Bilateral hips projects normally over the acetabula. No proximal femoral fracture identified. Pelvic phleboliths. Minimal degenerative changes at the hips. IMPRESSION: Negative for acute bony abnormality Electronically Signed   By: Corrie Mckusick D.O.   On: 11/26/2018 13:30    Procedures Procedures (including critical care time)  Medications Ordered in ED Medications  sodium chloride 0.9 % bolus 500 mL (0 mLs Intravenous Stopped 11/26/18 1433)  fentaNYL (SUBLIMAZE) injection 25 mcg (25 mcg Intravenous Given 11/26/18 1350)     Initial Impression / Assessment and Plan / ED Course  I have reviewed the triage vital signs and the nursing notes.  Pertinent labs & imaging results that were available during my care of the patient were reviewed by me and considered in my medical decision making (see chart for details).        Patient presenting for evaluation of right leg pain after a fall.  Patient with pain and swelling over the lateral right ankle, but also pain in her right leg.  She was unable to walk due  to pain, will obtain x-rays for further evaluation.  Additionally, patient's blood pressure soft on arrival, 99 systolic.  Per chart review, she is often between upper 90s and low 100s, however in order to control her pain, will give fluids along with some pain medication.  X-rays viewed and interpreted by me, no fractures or dislocation.  Upon reassessment of patient, patient reports pain is improved with medication.  After pain control, she is able to move range her leg, knee, foot, and ankle.  Discussed with patient likely MSK cause, will treat symptomatically with pain control, ankle brace, knee sleeve for the next week.  If pain does not improve, follow-up with orthopedics. At this time, pt appears safe for discharge.  Return precautions given.  Patient states she understands and agrees plan.   Final Clinical Impressions(s) / ED Diagnoses   Final diagnoses:  Right leg pain  Fall, initial encounter    ED Discharge Orders         Ordered    methocarbamol (ROBAXIN) 500 MG tablet  At bedtime PRN     11/26/18 1427    meloxicam (MOBIC) 7.5 MG tablet  Daily     11/26/18 1427           Rolanda Campa, PA-C 11/26/18 1520    Lajean Saver, MD 11/27/18 1426

## 2018-11-26 NOTE — Discharge Instructions (Signed)
Toma meloxicam cada dia por dolor e inflamacion Toma robaxin para relajar los muculos. Ten cuidado, puede causar cansado [employer] no esta mejorando en [place], da una cita con el doctor ortopedico (direccions debajo) Regresa a la sala de emergencia si tiene nuevas sintomas

## 2018-11-26 NOTE — ED Notes (Signed)
Ortho tech paged  

## 2018-11-26 NOTE — ED Notes (Signed)
ED Provider at bedside. 

## 2018-11-27 ENCOUNTER — Other Ambulatory Visit: Payer: Self-pay | Admitting: Family Medicine

## 2018-11-27 ENCOUNTER — Telehealth: Payer: Self-pay

## 2018-11-27 DIAGNOSIS — E782 Mixed hyperlipidemia: Secondary | ICD-10-CM

## 2018-11-27 NOTE — Telephone Encounter (Signed)
Patient states she doesn't know the names of the pills but that there are more she needs

## 2018-11-27 NOTE — Telephone Encounter (Signed)
Pt has refills on all meds with the pharmacy. No further action is required.

## 2018-11-27 NOTE — Telephone Encounter (Signed)
1) Medication(s) Requested (by name): Metformin bp Stomach medicine   2) Pharmacy of Choice: [email or web address]

## 2018-12-04 ENCOUNTER — Encounter: Payer: Self-pay | Admitting: Primary Care

## 2018-12-04 ENCOUNTER — Ambulatory Visit: Payer: Self-pay | Attending: Primary Care | Admitting: Primary Care

## 2018-12-04 ENCOUNTER — Other Ambulatory Visit: Payer: Self-pay

## 2018-12-04 VITALS — BP 137/76 | HR 86 | Temp 98.7°F | Resp 18

## 2018-12-04 DIAGNOSIS — W19XXXD Unspecified fall, subsequent encounter: Secondary | ICD-10-CM

## 2018-12-04 DIAGNOSIS — R059 Cough, unspecified: Secondary | ICD-10-CM

## 2018-12-04 DIAGNOSIS — Z794 Long term (current) use of insulin: Secondary | ICD-10-CM

## 2018-12-04 DIAGNOSIS — Z09 Encounter for follow-up examination after completed treatment for conditions other than malignant neoplasm: Secondary | ICD-10-CM

## 2018-12-04 DIAGNOSIS — E782 Mixed hyperlipidemia: Secondary | ICD-10-CM

## 2018-12-04 DIAGNOSIS — R05 Cough: Secondary | ICD-10-CM

## 2018-12-04 DIAGNOSIS — I10 Essential (primary) hypertension: Secondary | ICD-10-CM

## 2018-12-04 DIAGNOSIS — E1165 Type 2 diabetes mellitus with hyperglycemia: Secondary | ICD-10-CM

## 2018-12-04 MED ORDER — IBUPROFEN 800 MG PO TABS: 600.0000 mg | ORAL_TABLET | Freq: Three times a day (TID) | ORAL | 1 refills | Status: DC | PRN

## 2018-12-04 MED ORDER — ATORVASTATIN CALCIUM 40 MG PO TABS: 40.0000 mg | ORAL_TABLET | Freq: Every day | ORAL | 2 refills | Status: DC

## 2018-12-04 MED ORDER — GABAPENTIN 300 MG PO CAPS: 300.0000 mg | ORAL_CAPSULE | Freq: Three times a day (TID) | ORAL | 6 refills | Status: DC

## 2018-12-04 MED ORDER — LORATADINE 10 MG PO TABS: 10.0000 mg | ORAL_TABLET | Freq: Every day | ORAL | 11 refills | Status: DC

## 2018-12-04 MED ORDER — IPRATROPIUM BROMIDE 0.03 % NA SOLN: 2.0000 | Freq: Two times a day (BID) | NASAL | 3 refills | Status: DC

## 2018-12-04 MED ORDER — ALBUTEROL SULFATE HFA 108 (90 BASE) MCG/ACT IN AERS: 2.0000 | INHALATION_SPRAY | Freq: Four times a day (QID) | RESPIRATORY_TRACT | 1 refills | Status: DC | PRN

## 2018-12-04 MED ORDER — GLIPIZIDE 5 MG PO TABS: 5.0000 mg | ORAL_TABLET | Freq: Every day | ORAL | 3 refills | Status: DC

## 2018-12-04 MED ORDER — METFORMIN HCL ER 500 MG PO TB24: 1000.0000 mg | ORAL_TABLET | Freq: Two times a day (BID) | ORAL | 6 refills | Status: DC

## 2018-12-04 NOTE — Progress Notes (Signed)
Established Patient Office Visit  Subjective:  Patient ID: Julie Robertson, female    DOB: [birth year]  Age: 43 y.o. MRN: [id number]  CC: No chief complaint on file.   HPI Tyna Huertas presents today as a emergency room follow-up status post fall.  Past medical history listed below  Past Medical History:  Diagnosis Date  . Diabetes mellitus without complication ([place])   . Diabetic neuropathy ([place]) 12/2013   vs carpal tunnell.  numbness tingling in right fingers. rx with Gabapentin.  Marland Kitchen Dyslipidemia 08/2009  . Gall stones 08/2009  . Obesity    BMI 41, 250# 03/2015    Past Surgical History:  Procedure Laterality Date  . CHOLECYSTECTOMY N/A 03/10/2015   Procedure: LAPAROSCOPIC CHOLECYSTECTOMY WITH INTRAOPERATIVE CHOLANGIOGRAM;  Surgeon: Georganna Skeans, MD;  Location: [place];  Service: General;  Laterality: N/A;  . ERCP N/A 03/11/2015   Procedure: ENDOSCOPIC RETROGRADE CHOLANGIOPANCREATOGRAPHY (ERCP);  Surgeon: Milus Banister, MD;  Location: [place];  Service: Endoscopy;  Laterality: N/A;  . ESOPHAGOGASTRODUODENOSCOPY (EGD) WITH PROPOFOL N/A 07/25/2018   Procedure: ESOPHAGOGASTRODUODENOSCOPY (EGD) WITH PROPOFOL;  Surgeon: Rush Landmark Telford Nab., MD;  Location: [facility];  Service: Gastroenterology;  Laterality: N/A;  . TUBAL LIGATION      Family History  Problem Relation Age of Onset  . Diabetes Mother   . Heart disease Mother   . Hypertension Mother   . Diabetes Father   . Bone cancer Maternal Grandfather   . Kidney disease Maternal Uncle   . Other Son        had kidney removed due to gun shot wound    Social History   Socioeconomic History  . Marital status: Divorced    Spouse name: Not on file  . Number of children: 2  . Years of education: Not on file  . Highest education level: Not on file  Occupational History  . Occupation: Development worker, community  Social Needs  . Financial resource strain: Not on file  . Food insecurity:    Worry: Not on file   Inability: Not on file  . Transportation needs:    Medical: Not on file    Non-medical: Not on file  Tobacco Use  . Smoking status: Current Every Day Smoker    Packs/day: 1.50    Years: 4.00    Pack years: 6.00    Types: Cigarettes  . Smokeless tobacco: Never Used  Substance and Sexual Activity  . Alcohol use: Yes    Comment: rare  . Drug use: No  . Sexual activity: Not on file  Lifestyle  . Physical activity:    Days per week: Not on file    Minutes per session: Not on file  . Stress: Not on file  Relationships  . Social connections:    Talks on phone: Not on file    Gets together: Not on file    Attends religious service: Not on file    Active member of club or organization: Not on file    Attends meetings of clubs or organizations: Not on file    Relationship status: Not on file  . Intimate partner violence:    Fear of current or ex partner: Not on file    Emotionally abused: Not on file    Physically abused: Not on file    Forced sexual activity: Not on file  Other Topics Concern  . Not on file  Social History Narrative   Patient has 2 sons one is 88, the other 26  as of 03/2015. Her kids are not the children of her current husband. As of 03/2015 she is not employed outside the home.    Outpatient Medications Prior to Visit  Medication Sig Dispense Refill  . benzonatate (TESSALON) 100 MG capsule Take 1-2 capsules (100-200 mg total) by mouth 3 (three) times daily as needed for cough. 60 capsule 0  . Blood Glucose Monitoring Suppl (TRUE METRIX METER) w/Device KIT Use as directed 1 kit 0  . glucose blood (TRUE METRIX BLOOD GLUCOSE TEST) test strip Use as instructed 100 each 12  . Insulin Glargine (LANTUS SOLOSTAR) 100 UNIT/ML Solostar Pen Inject 50 Units into the skin daily. (Patient taking differently: Inject 50 Units into the skin at bedtime. ) 5 pen 11  . Insulin Pen Needle 31G X 5 MM MISC Inject 10 units subcutaneous at bedtime 100 each 0  . losartan (COZAAR) 25 MG  tablet Take 1 tablet (25 mg total) by mouth daily. 30 tablet 11  . meloxicam (MOBIC) 7.5 MG tablet Take 1 tablet (7.5 mg total) by mouth daily. 14 tablet 0  . methocarbamol (ROBAXIN) 500 MG tablet Take 1 tablet (500 mg total) by mouth at bedtime as needed for muscle spasms. 10 tablet 0  . pantoprazole (PROTONIX) 40 MG tablet Take 1 tablet (40 mg total) by mouth 2 (two) times daily. 60 tablet 3  . TRUEPLUS LANCETS 28G MISC Use as directed 100 each 12  . albuterol (PROVENTIL HFA;VENTOLIN HFA) 108 (90 Base) MCG/ACT inhaler Inhale 2 puffs into the lungs every 6 (six) hours as needed for wheezing or shortness of breath. 1 Inhaler 3  . atorvastatin (LIPITOR) 40 MG tablet Take 1 tablet (40 mg total) by mouth at bedtime. 30 tablet 2  . gabapentin (NEURONTIN) 300 MG capsule Take 1 capsule (300 mg total) by mouth 3 (three) times daily. (Patient taking differently: Take 300 mg by mouth See admin instructions. Take 1 capsule (300 mg) by mouth up to 3 times daily for circulation. (typically twice daily)) 90 capsule 6  . glipiZIDE (GLUCOTROL) 5 MG tablet Take 1 tablet (5 mg total) by mouth daily before breakfast. 30 tablet 6  . ibuprofen (ADVIL,MOTRIN) 200 MG tablet Take 400 mg by mouth every 6 (six) hours as needed for moderate pain.    Marland Kitchen ipratropium (ATROVENT) 0.03 % nasal spray Place 2 sprays into both nostrils 2 (two) times daily. 30 mL 0  . loratadine (CLARITIN) 10 MG tablet Take 1 tablet (10 mg total) by mouth daily. 30 tablet 0  . metFORMIN (GLUCOPHAGE-XR) 500 MG 24 hr tablet Take 2 tablets (1,000 mg total) by mouth 2 (two) times daily. (Patient taking differently: Take 500 mg by mouth 2 (two) times daily. ) 60 tablet 6  . naproxen sodium (ALEVE) 220 MG tablet Take 220 mg by mouth 2 (two) times daily as needed (pain).     Facility-Administered Medications Prior to Visit  Medication Dose Route Frequency Provider Last Rate Last Dose  . 0.9 %  sodium chloride infusion  500 mL Intravenous Once Nandigam,  Kavitha V, MD        No Known Allergies  ROS Review of Systems  Constitutional: Negative.   HENT: Negative.   Eyes: Negative.   Respiratory: Negative.   Cardiovascular: Negative.   Gastrointestinal: Negative.   Endocrine: Negative.   Genitourinary: Negative.   Musculoskeletal: Negative.   Skin: Negative.   Allergic/Immunologic: Negative.   Neurological: Negative.   Hematological: Negative.   Psychiatric/Behavioral: Negative.  Objective:    Physical Exam  Constitutional: She is oriented to person, place, and time. She appears well-developed and well-nourished.  HENT:  Head: Normocephalic and atraumatic.  Eyes: EOM are normal.  Neck: Normal range of motion. Neck supple.  Cardiovascular: Normal rate and regular rhythm.  Pulmonary/Chest: Effort normal and breath sounds normal.  Abdominal: Soft. She exhibits distension.  Musculoskeletal:        General: Edema present.  Neurological: She is alert and oriented to person, place, and time.  Skin: Skin is warm and dry.  Psychiatric: She has a normal mood and affect.    BP 137/76 (BP Location: Left Arm, Patient Position: Sitting, Cuff Size: Normal)   Pulse 86   Temp 98.7 F (37.1 C) (Oral)   Resp 18   SpO2 97%  Wt Readings from Last 3 Encounters:  07/25/18 262 lb (118.8 kg)  06/11/18 261 lb (118.4 kg)  05/18/18 261 lb (118.4 kg)     Health Maintenance Due  Topic Date Due  . OPHTHALMOLOGY EXAM  12/01/1984  . FOOT EXAM  08/25/2017  . PAP SMEAR-Modifier  07/22/2018  . HEMOGLOBIN A1C  09/20/2018    There are no preventive care reminders to display for this patient.  Lab Results  Component Value Date   TSH 3.090 03/22/2018   Lab Results  Component Value Date   WBC 7.2 12/04/2018   HGB 10.1 (L) 12/04/2018   HCT 34.0 12/04/2018   MCV 69 (L) 12/04/2018   PLT 379 12/04/2018   Lab Results  Component Value Date   NA 135 12/04/2018   K 4.6 12/04/2018   CO2 21 12/04/2018   GLUCOSE 229 (H) 12/04/2018    BUN 16 12/04/2018   CREATININE 0.75 12/04/2018   BILITOT 0.3 12/04/2018   ALKPHOS 83 12/04/2018   AST 29 12/04/2018   ALT 37 (H) 12/04/2018   PROT 6.7 12/04/2018   ALBUMIN 3.8 12/04/2018   CALCIUM 9.3 12/04/2018   ANIONGAP 9 05/14/2015   Lab Results  Component Value Date   CHOL 143 12/04/2018   Lab Results  Component Value Date   HDL 37 (L) 12/04/2018   Lab Results  Component Value Date   LDLCALC 61 12/04/2018   Lab Results  Component Value Date   TRIG 224 (H) 12/04/2018   Lab Results  Component Value Date   CHOLHDL 3.9 12/04/2018   Lab Results  Component Value Date   HGBA1C 10.2 (H) 12/04/2018      Assessment & Plan:   Problem List Items Addressed This Visit    Hyperlipidemia   Relevant Medications   atorvastatin (LIPITOR) 40 MG tablet   Other Relevant Orders   Lipid panel (Completed)   Uncontrolled type 2 diabetes mellitus with hyperglycemia, with long-term current use of insulin ([facility]) - Primary   Relevant Medications   atorvastatin (LIPITOR) 40 MG tablet   gabapentin (NEURONTIN) 300 MG capsule   glipiZIDE (GLUCOTROL) 5 MG tablet   metFORMIN (GLUCOPHAGE-XR) 500 MG 24 hr tablet   Other Relevant Orders   Hemoglobin A1c (Completed)   Essential hypertension   Relevant Medications   atorvastatin (LIPITOR) 40 MG tablet   Other Relevant Orders   CBC with Differential (Completed)   Comprehensive metabolic panel (Completed)    Other Visit Diagnoses    Fall, subsequent encounter       Hospital discharge follow-up       Cough       Relevant Medications   albuterol (VENTOLIN HFA) 108 (90 Base) MCG/ACT  inhaler    Diagnoses and all orders for this visit:  Uncontrolled type 2 diabetes mellitus with hyperglycemia, with long-term current use of insulin ([facility]) -     Hemoglobin A1c 10.2 Currently patient is on insulin and oral diabetic medication.  Unknown compliance a factor with a A1c of 10.2.  Needs tighter control will refer to clinical pharmacist to evaluate,  teach, change medication if warranted.  Essential hypertension Blood pressure today is acceptable currently on losartan for blood pressure control and renal protection. -     CBC with Differential -     Comprehensive metabolic panel  Mixed hyperlipidemia On statin will check lipid panel for appropriate dosing on atorvastatin. -     Lipid panel  Fall, subsequent encounter Patient fell on Nov 25, 2018 patient fell when walking in the rain she slipped right leg went back then she fell.  She complains of difficulty walking with pain.  Review imaging indicated no fractures, anethopathy or dislocation.  Hospital discharge follow-up ED follow-up from initial fall difficulty walking explained inflammatory process causing the pain   Tobacco Abuse Each visit d/w cessation    Meds ordered this encounter  Medications  . albuterol (VENTOLIN HFA) 108 (90 Base) MCG/ACT inhaler    Sig: Inhale 2 puffs into the lungs every 6 (six) hours as needed for wheezing or shortness of breath.    Dispense:  1 Inhaler    Refill:  1  . atorvastatin (LIPITOR) 40 MG tablet    Sig: Take 1 tablet (40 mg total) by mouth at bedtime.    Dispense:  30 tablet    Refill:  2  . gabapentin (NEURONTIN) 300 MG capsule    Sig: Take 1 capsule (300 mg total) by mouth 3 (three) times daily.    Dispense:  90 capsule    Refill:  6  . glipiZIDE (GLUCOTROL) 5 MG tablet    Sig: Take 1 tablet (5 mg total) by mouth daily before breakfast.    Dispense:  30 tablet    Refill:  3  . ipratropium (ATROVENT) 0.03 % nasal spray    Sig: Place 2 sprays into both nostrils 2 (two) times daily.    Dispense:  30 mL    Refill:  3  . loratadine (CLARITIN) 10 MG tablet    Sig: Take 1 tablet (10 mg total) by mouth daily.    Dispense:  30 tablet    Refill:  11  . metFORMIN (GLUCOPHAGE-XR) 500 MG 24 hr tablet    Sig: Take 2 tablets (1,000 mg total) by mouth 2 (two) times daily.    Dispense:  60 tablet    Refill:  6  . ibuprofen (ADVIL) 800  MG tablet    Sig: Take 1 tablet (800 mg total) by mouth every 8 (eight) hours as needed for up to 30 days for mild pain.    Dispense:  60 tablet    Refill:  1    Follow-up: Return in about 3 months (around 03/06/2019) for DM,HTN, Hyperlipidemia.    Kerin Perna, NP

## 2018-12-04 NOTE — Progress Notes (Signed)
Patient verified DOB Patient has taken medication today. Patient has not eaten today. Patient complains of knee and and right foot pain from the fall scaled currently at an 8. Patient states she twisted the ankle and has bruises.

## 2018-12-05 LAB — CBC WITH DIFFERENTIAL/PLATELET
Basophils Absolute: 0 10*3/uL (ref 0.0–0.2)
Basos: 0 %
EOS (ABSOLUTE): 0.3 10*3/uL (ref 0.0–0.4)
Eos: 5 %
Hematocrit: 34 % (ref 34.0–46.6)
Hemoglobin: 10.1 g/dL — ABNORMAL LOW (ref 11.1–15.9)
Immature Grans (Abs): 0 10*3/uL (ref 0.0–0.1)
Immature Granulocytes: 0 %
Lymphocytes Absolute: 2.3 10*3/uL (ref 0.7–3.1)
Lymphs: 33 %
MCH: 20.5 pg — ABNORMAL LOW (ref 26.6–33.0)
MCHC: 29.7 g/dL — ABNORMAL LOW (ref 31.5–35.7)
MCV: 69 fL — ABNORMAL LOW (ref 79–97)
Monocytes Absolute: 0.6 10*3/uL (ref 0.1–0.9)
Monocytes: 8 %
Neutrophils Absolute: 3.9 10*3/uL (ref 1.4–7.0)
Neutrophils: 54 %
Platelets: 379 10*3/uL (ref 150–450)
RBC: 4.93 x10E6/uL (ref 3.77–5.28)
RDW: 16.3 % — ABNORMAL HIGH (ref 11.7–15.4)
WBC: 7.2 10*3/uL (ref 3.4–10.8)

## 2018-12-05 LAB — COMPREHENSIVE METABOLIC PANEL
ALT: 37 IU/L — ABNORMAL HIGH (ref 0–32)
AST: 29 IU/L (ref 0–40)
Albumin/Globulin Ratio: 1.3 (ref 1.2–2.2)
Albumin: 3.8 g/dL (ref 3.8–4.8)
Alkaline Phosphatase: 83 IU/L (ref 39–117)
BUN/Creatinine Ratio: 21 (ref 9–23)
BUN: 16 mg/dL (ref 6–24)
Bilirubin Total: 0.3 mg/dL (ref 0.0–1.2)
CO2: 21 mmol/L (ref 20–29)
Calcium: 9.3 mg/dL (ref 8.7–10.2)
Chloride: 99 mmol/L (ref 96–106)
Creatinine, Ser: 0.75 mg/dL (ref 0.57–1.00)
GFR calc Af Amer: 112 mL/min/{1.73_m2} (ref >59)
GFR calc non Af Amer: 97 mL/min/{1.73_m2} (ref >59)
Globulin, Total: 2.9 g/dL (ref 1.5–4.5)
Glucose: 229 mg/dL — ABNORMAL HIGH (ref 65–99)
Potassium: 4.6 mmol/L (ref 3.5–5.2)
Sodium: 135 mmol/L (ref 134–144)
Total Protein: 6.7 g/dL (ref 6.0–8.5)

## 2018-12-05 LAB — HEMOGLOBIN A1C
Est. average glucose Bld gHb Est-mCnc: 246 mg/dL
Hgb A1c MFr Bld: 10.2 % — ABNORMAL HIGH (ref 4.8–5.6)

## 2018-12-05 LAB — LIPID PANEL
Chol/HDL Ratio: 3.9 ratio (ref 0.0–4.4)
Cholesterol, Total: 143 mg/dL (ref 100–199)
HDL: 37 mg/dL — ABNORMAL LOW (ref >39)
LDL Calculated: 61 mg/dL (ref 0–99)
Triglycerides: 224 mg/dL — ABNORMAL HIGH (ref 0–149)
VLDL Cholesterol Cal: 45 mg/dL — ABNORMAL HIGH (ref 5–40)

## 2018-12-12 ENCOUNTER — Other Ambulatory Visit: Payer: Self-pay

## 2018-12-12 ENCOUNTER — Ambulatory Visit: Payer: Self-pay | Attending: Family Medicine

## 2018-12-12 ENCOUNTER — Telehealth: Payer: Self-pay | Admitting: Primary Care

## 2018-12-12 NOTE — Telephone Encounter (Signed)
I called Pt since she had a TELE visit for financial, she was informed that we need to reschedule since she did not submitted the application any papers

## 2018-12-20 ENCOUNTER — Ambulatory Visit: Payer: Medicaid Other | Admitting: Primary Care

## 2018-12-31 ENCOUNTER — Other Ambulatory Visit: Payer: Self-pay

## 2018-12-31 ENCOUNTER — Ambulatory Visit (INDEPENDENT_AMBULATORY_CARE_PROVIDER_SITE_OTHER): Payer: Self-pay | Admitting: Primary Care

## 2018-12-31 ENCOUNTER — Encounter (INDEPENDENT_AMBULATORY_CARE_PROVIDER_SITE_OTHER): Payer: Self-pay | Admitting: Primary Care

## 2018-12-31 VITALS — BP 105/71 | HR 82 | Temp 98.2°F | Ht 65.0 in | Wt 252.4 lb

## 2018-12-31 DIAGNOSIS — T1490XA Injury, unspecified, initial encounter: Secondary | ICD-10-CM

## 2018-12-31 DIAGNOSIS — K219 Gastro-esophageal reflux disease without esophagitis: Secondary | ICD-10-CM

## 2018-12-31 DIAGNOSIS — W19XXXA Unspecified fall, initial encounter: Secondary | ICD-10-CM

## 2018-12-31 DIAGNOSIS — Z794 Long term (current) use of insulin: Secondary | ICD-10-CM

## 2018-12-31 DIAGNOSIS — E1165 Type 2 diabetes mellitus with hyperglycemia: Secondary | ICD-10-CM

## 2018-12-31 DIAGNOSIS — Y9302 Activity, running: Secondary | ICD-10-CM

## 2018-12-31 DIAGNOSIS — I10 Essential (primary) hypertension: Secondary | ICD-10-CM

## 2018-12-31 DIAGNOSIS — W010XXA Fall on same level from slipping, tripping and stumbling without subsequent striking against object, initial encounter: Secondary | ICD-10-CM

## 2018-12-31 LAB — GLUCOSE, POCT (MANUAL RESULT ENTRY): POC Glucose: 148 mg/dl — AB (ref 70–99)

## 2018-12-31 MED ORDER — IBUPROFEN 800 MG PO TABS: 600.0000 mg | ORAL_TABLET | Freq: Three times a day (TID) | ORAL | 1 refills | Status: AC | PRN

## 2018-12-31 NOTE — Progress Notes (Signed)
Patient is not taking insulin. She thought it was causing the numbness in her hands. She also stopped taking it because her glucose levels were low.  She is on a juice cleanse for 21 days and in that time has noticed that her glucose is lower. Today is the last day of the cleanse.

## 2018-12-31 NOTE — Progress Notes (Signed)
Established Patient Office Visit  Subjective:  Patient ID: Julie Robertson, female    DOB: [birth year]  Age: 43 y.o. MRN: [id number]  CC:  Chief Complaint  Patient presents with  . Numbness    foot and hands  . Pain    knee and ankle     HPI [name] presents for pain in her right knee and ankle. She fell approximately a month ago it was raining and she was running to get out of the rain she slipped and fell.Denies shortness of breath, headaches, chest pain or lower extremity edema  Past Medical History:  Diagnosis Date  . Diabetes mellitus without complication ([place])   . Diabetic neuropathy ([place]) 12/2013   vs carpal tunnell.  numbness tingling in right fingers. rx with Gabapentin.  Marland Kitchen Dyslipidemia 08/2009  . Gall stones 08/2009  . Obesity    BMI 41, 250# 03/2015    Past Surgical History:  Procedure Laterality Date  . CHOLECYSTECTOMY N/A 03/10/2015   Procedure: LAPAROSCOPIC CHOLECYSTECTOMY WITH INTRAOPERATIVE CHOLANGIOGRAM;  Surgeon: Georganna Skeans, MD;  Location: [place];  Service: General;  Laterality: N/A;  . ERCP N/A 03/11/2015   Procedure: ENDOSCOPIC RETROGRADE CHOLANGIOPANCREATOGRAPHY (ERCP);  Surgeon: Milus Banister, MD;  Location: [place] [facility];  Service: Endoscopy;  Laterality: N/A;  . ESOPHAGOGASTRODUODENOSCOPY (EGD) WITH PROPOFOL N/A 07/25/2018   Procedure: ESOPHAGOGASTRODUODENOSCOPY (EGD) WITH PROPOFOL;  Surgeon: Rush Landmark Telford Nab., MD;  Location[facility];  Service: Gastroenterology;  Laterality: N/A;  . TUBAL LIGATION      Family History  Problem Relation Age of Onset  . Diabetes Mother   . Heart disease Mother   . Hypertension Mother   . Diabetes Father   . Bone cancer Maternal Grandfather   . Kidney disease Maternal Uncle   . Other Son        had kidney removed due to gun shot wound    Social History   Socioeconomic History  . Marital status: Divorced    Spouse name: Not on file  . Number of children: 2  . Years of  education: Not on file  . Highest education level: Not on file  Occupational History  . Occupation: Development worker, community  Social Needs  . Financial resource strain: Not on file  . Food insecurity    Worry: Not on file    Inability: Not on file  . Transportation needs    Medical: Not on file    Non-medical: Not on file  Tobacco Use  . Smoking status: Current Every Day Smoker    Packs/day: 1.50    Years: 4.00    Pack years: 6.00    Types: Cigarettes  . Smokeless tobacco: Never Used  Substance and Sexual Activity  . Alcohol use: Yes    Comment: rare  . Drug use: No  . Sexual activity: Not on file  Lifestyle  . Physical activity    Days per week: Not on file    Minutes per session: Not on file  . Stress: Not on file  Relationships  . Social Herbalist on phone: Not on file    Gets together: Not on file    Attends religious service: Not on file    Active member of club or organization: Not on file    Attends meetings of clubs or organizations: Not on file    Relationship status: Not on file  . Intimate partner violence    Fear of current or ex partner: Not on file  Emotionally abused: Not on file    Physically abused: Not on file    Forced sexual activity: Not on file  Other Topics Concern  . Not on file  Social History Narrative   Patient has 2 sons one is 29, the other 25 as of 03/2015. Her kids are not the children of her current husband. As of 03/2015 she is not employed outside the home.    Outpatient Medications Prior to Visit  Medication Sig Dispense Refill  . albuterol (VENTOLIN HFA) 108 (90 Base) MCG/ACT inhaler Inhale 2 puffs into the lungs every 6 (six) hours as needed for wheezing or shortness of breath. 1 Inhaler 1  . atorvastatin (LIPITOR) 40 MG tablet Take 1 tablet (40 mg total) by mouth at bedtime. 30 tablet 2  . Blood Glucose Monitoring Suppl (TRUE METRIX METER) w/Device KIT Use as directed 1 kit 0  . gabapentin (NEURONTIN) 300 MG capsule Take 1  capsule (300 mg total) by mouth 3 (three) times daily. 90 capsule 6  . glipiZIDE (GLUCOTROL) 5 MG tablet Take 1 tablet (5 mg total) by mouth daily before breakfast. 30 tablet 3  . glucose blood (TRUE METRIX BLOOD GLUCOSE TEST) test strip Use as instructed 100 each 12  . Insulin Pen Needle 31G X 5 MM MISC Inject 10 units subcutaneous at bedtime 100 each 0  . ipratropium (ATROVENT) 0.03 % nasal spray Place 2 sprays into both nostrils 2 (two) times daily. 30 mL 3  . loratadine (CLARITIN) 10 MG tablet Take 1 tablet (10 mg total) by mouth daily. 30 tablet 11  . losartan (COZAAR) 25 MG tablet Take 1 tablet (25 mg total) by mouth daily. 30 tablet 11  . meloxicam (MOBIC) 7.5 MG tablet Take 1 tablet (7.5 mg total) by mouth daily. 14 tablet 0  . metFORMIN (GLUCOPHAGE-XR) 500 MG 24 hr tablet Take 2 tablets (1,000 mg total) by mouth 2 (two) times daily. 60 tablet 6  . methocarbamol (ROBAXIN) 500 MG tablet Take 1 tablet (500 mg total) by mouth at bedtime as needed for muscle spasms. 10 tablet 0  . TRUEPLUS LANCETS 28G MISC Use as directed 100 each 12  . ibuprofen (ADVIL) 800 MG tablet Take 1 tablet (800 mg total) by mouth every 8 (eight) hours as needed for up to 30 days for mild pain. 60 tablet 1  . pantoprazole (PROTONIX) 40 MG tablet Take 1 tablet (40 mg total) by mouth 2 (two) times daily. (Patient not taking: Reported on 12/31/2018) 60 tablet 3  . benzonatate (TESSALON) 100 MG capsule Take 1-2 capsules (100-200 mg total) by mouth 3 (three) times daily as needed for cough. 60 capsule 0  . Insulin Glargine (LANTUS SOLOSTAR) 100 UNIT/ML Solostar Pen Inject 50 Units into the skin daily. (Patient not taking: Reported on 12/31/2018) 5 pen 11   Facility-Administered Medications Prior to Visit  Medication Dose Route Frequency Provider Last Rate Last Dose  . 0.9 %  sodium chloride infusion  500 mL Intravenous Once Nandigam, Kavitha V, MD        No Known Allergies  ROS Review of Systems  Neurological: Positive  for numbness.  All other systems reviewed and are negative.     Objective:    Physical Exam  Constitutional: She is oriented to person, place, and time. She appears well-developed and well-nourished.  HENT:  Head: Atraumatic.  Neck: Normal range of motion. Neck supple.  Abdominal: Soft. Bowel sounds are normal. She exhibits distension.  Musculoskeletal: Normal range of motion.  Neurological: She is oriented to person, place, and time.  Skin: Skin is warm.  Psychiatric: She has a normal mood and affect.  Sensory exam of the foot is normal, tested with the monofilament. Good pulses, no lesions or ulcers, good peripheral pulses.  BP 105/71 (BP Location: Right Arm, Patient Position: Sitting, Cuff Size: Large)   Pulse 82   Temp 98.2 F (36.8 C) (Oral)   Ht 5' 5" (1.651 m)   Wt 252 lb 6.4 oz (114.5 kg)   LMP 12/24/2018 (Approximate)   SpO2 97%   BMI 42.00 kg/m  Wt Readings from Last 3 Encounters:  12/31/18 252 lb 6.4 oz (114.5 kg)  07/25/18 262 lb (118.8 kg)  06/11/18 261 lb (118.4 kg)     Health Maintenance Due  Topic Date Due  . OPHTHALMOLOGY EXAM  12/01/1984  . FOOT EXAM  08/25/2017  . PAP SMEAR-Modifier  07/22/2018    There are no preventive care reminders to display for this patient.  Lab Results  Component Value Date   TSH 3.090 03/22/2018   Lab Results  Component Value Date   WBC 7.2 12/04/2018   HGB 10.1 (L) 12/04/2018   HCT 34.0 12/04/2018   MCV 69 (L) 12/04/2018   PLT 379 12/04/2018   Lab Results  Component Value Date   NA 135 12/04/2018   K 4.6 12/04/2018   CO2 21 12/04/2018   GLUCOSE 229 (H) 12/04/2018   BUN 16 12/04/2018   CREATININE 0.75 12/04/2018   BILITOT 0.3 12/04/2018   ALKPHOS 83 12/04/2018   AST 29 12/04/2018   ALT 37 (H) 12/04/2018   PROT 6.7 12/04/2018   ALBUMIN 3.8 12/04/2018   CALCIUM 9.3 12/04/2018   ANIONGAP 9 05/14/2015   Lab Results  Component Value Date   CHOL 143 12/04/2018   Lab Results  Component Value Date    HDL 37 (L) 12/04/2018   Lab Results  Component Value Date   LDLCALC 61 12/04/2018   Lab Results  Component Value Date   TRIG 224 (H) 12/04/2018   Lab Results  Component Value Date   CHOLHDL 3.9 12/04/2018   Lab Results  Component Value Date   HGBA1C 10.2 (H) 12/04/2018      Assessment & Plan:   Problem List Items Addressed This Visit    Uncontrolled type 2 diabetes mellitus with hyperglycemia, with long-term current use of insulin ([place]) - Primary   Relevant Orders   Glucose (CBG) (Completed)   Essential hypertension    Other Visit Diagnoses    Injury while fell running       Relevant Orders   Ambulatory referral to Orthopedic Surgery      Meds ordered this encounter  Medications  . ibuprofen (ADVIL) 800 MG tablet    Sig: Take 1 tablet (800 mg total) by mouth every 8 (eight) hours as needed for up to 30 days for mild pain.    Dispense:  60 tablet    Refill:  1   [place] was seen today for numbness and pain.  Diagnoses and all orders for this visit:  Uncontrolled type 2 diabetes mellitus with hyperglycemia, with long-term current use of insulin ([place]) The ADA recommends the following therapeutic goals for glycemic control related to A1c measurements: Goal of therapy: Less than 6.5 hemoglobin A1c.  Reference clinical practice recommendations. Foods that are high in carbohydrates are the following rice, potatoes, breads, sugars, and pastas.  Reduction in the intake (eating) will assist in lowering your blood sugars. -  A1C 10.2 stopped her insulin due to she started this detox drink ,no food consumption and Blood sugars in the AM ranged from 70-100. Also only taking metformin once a day   Essential hypertension Unremarkable asked why she was on Bp meds and she does   Injury while fell running she HPI  -     Ambulatory referral to Orthopedic Surgery  Other orders -     ibuprofen (ADVIL) 800 MG tablet; Take 1 tablet (800 mg total) by mouth every 8 (eight) hours as  needed for up to 30 days for mild pain.   Follow-up: Return in about 6 weeks (around 02/11/2019) for Review diabetes cbg .    Kerin Perna, NP

## 2018-12-31 NOTE — Patient Instructions (Signed)
La diabetes mellitus y el cuidado de los pies Diabetes Mellitus and Foot Care El cuidado de los pies es un aspecto importante de la salud, especialmente si tiene diabetes. La diabetes puede generar problemas debido a que el flujo sanguneo (circulacin) es deficiente en las piernas y los pies, y esto puede hacer que la piel:  Se torne ms fina y seca.  Se resquebraje ms fcilmente.  Cicatrice ms lentamente.  Se descame y agriete. Tambin pueden estar daados los nervios (neuropata) de las piernas y de los pies, lo que provoca una disminucin de la sensibilidad. En consecuencia, es posible que no advierta heridas pequeas en los pies que pueden causar problemas ms graves. Identificar y tratar cualquier complicacin lo antes posible es la mejor manera de evitar futuros problemas de pie. Cmo cuidar los pies Higiene de los pies  Lvese los pies todos los das con agua tibia y un jabn suave. No use agua caliente. Luego squese los pies y entre los dedos dando palmaditas, hasta que estn completamente secos. No remoje los pies, ya que esto puede resecar la piel.  Crtese las uas de los pies en lnea recta. No escarbe debajo de las uas o alrededor de las cutculas. Lime los bordes de las uas con una lima o esmeril.  Aplique una locin hidratante o vaselina en la piel de los pies y en las uas secas y quebradizas. Use una locin que no contenga alcohol ni fragancias. No aplique locin entre los dedos. Zapatos y calcetines  Use calcetines de algodn o medias limpias todos los das. Asegrese de que no le ajusten demasiado. No use calcetines que le lleguen a las rodillas, ya que podran disminuir el flujo de sangre a las piernas.  Use zapatos de cuero que le queden bien y que sean acolchados. Revise siempre los zapatos antes de ponerlos para asegurarse de que no haya objetos en su interior.  Para amoldar los zapatos, clcelos solo algunas horas por da. Esto evitar lesiones en los pies.  Heridas, rasguos, durezas y callosidades  Controle sus pies diariamente para observar si hay ampollas, cortes, moretones, llagas o enrojecimiento. Si no puede ver la planta del pie, use un espejo o pdale ayuda a otra persona.  No corte las durezas o callosidades, ni trate de quitarlas con medicamentos.  Si algo le ha raspado, cortado o lastimado la piel de los pies, mantenga la piel de esa zona limpia y seca. Puede higienizar estas zonas con agua y un jabn suave. No limpie la zona con agua oxigenada, alcohol ni yodo.  Si tiene una herida, un rasguo, una dureza o una callosidad en el pie, revsela varias veces al da para asegurarse de que se est curando y no se infecte. Est atento a los siguientes signos: ? Dolor, hinchazn o enrojecimiento. ? Lquido o sangre. ? Calor. ? Pus o mal olor. Instrucciones generales  No se cruce de piernas. Esto puede disminuir el flujo de sangre a los pies.  No use bolsas de agua caliente ni almohadillas trmicas en los pies. Podran causar quemaduras. Si ha perdido la sensibilidad en los pies o las piernas, no sabr lo que le est sucediendo hasta que sea demasiado tarde.  Proteja sus pies del calor y del fro con calzado, en la playa o sobre el pavimento caliente.  Programe una cita para un examen completo de los pies por lo menos una vez al ao (anualmente) o con ms frecuencia si tiene problemas en los pies. Si tiene problemas en los   pies, infrmele al mdico de inmediato sobre los cortes, las llagas o los moretones. Comunquese con un mdico si:  Tiene una afeccin que aumenta su riesgo de tener infecciones y tiene cortes, llagas o moretones en los pies.  Tiene una lesin que no se cura.  Tiene una zona irritada en las piernas o los pies.  Siente una sensacin de ardor u hormigueo en las piernas o los pies.  Siente dolor o calambres en las piernas o los pies.  Las piernas o los pies estn adormecidos.  Siente los pies siempre fros.   Siente dolor alrededor de una ua del pie. Solicite ayuda de inmediato si:  Tiene una herida, un rasguo, una dureza o una callosidad en el pie y: ? Tiene dolor, hinchazn o enrojecimiento que empeora. ? Le sale lquido o sangre de la herida, el rasguo, la dureza o la callosidad. ? La herida, el rasguo, la dureza o la callosidad est caliente al tacto. ? Le sale pus o mal olor de la herida, el rasguo, la dureza o la callosidad. ? Tiene fiebre. ? Tiene una lnea roja que sube por la pierna. Resumen  Controle todos los das el estado de sus pies para observar si hay cortes, llagas, manchas rojas, hinchazn o ampollas.  Humctese los pies y las piernas a diario.  Use zapatos de cuero que le queden bien y que sean acolchados.  Si tiene problemas en los pies, infrmele al mdico de inmediato sobre los cortes, las llagas o los moretones.  Programe una cita para un examen completo de los pies por lo menos una vez al ao (anualmente) o con ms frecuencia si tiene problemas en los pies. Esta informacin no tiene como fin reemplazar el consejo del mdico. Asegrese de hacerle al mdico cualquier pregunta que tenga. Document Released: 06/20/2005 Document Revised: 02/10/2017 Document Reviewed: 02/10/2017 Elsevier Patient Education  2020 [employer] Inc.  

## 2019-01-08 ENCOUNTER — Other Ambulatory Visit: Payer: Self-pay

## 2019-01-08 ENCOUNTER — Ambulatory Visit: Payer: Self-pay | Attending: Family Medicine

## 2019-01-09 ENCOUNTER — Encounter (HOSPITAL_COMMUNITY): Payer: Self-pay

## 2019-01-09 ENCOUNTER — Emergency Department (HOSPITAL_COMMUNITY): Payer: HRSA Program

## 2019-01-09 ENCOUNTER — Emergency Department (HOSPITAL_COMMUNITY)
Admission: EM | Admit: 2019-01-09 | Discharge: 2019-01-09 | Disposition: A | Discharge status: Home / Self Care | Payer: HRSA Program | Attending: Emergency Medicine | Admitting: Emergency Medicine

## 2019-01-09 ENCOUNTER — Other Ambulatory Visit: Payer: Self-pay

## 2019-01-09 DIAGNOSIS — Z7984 Long term (current) use of oral hypoglycemic drugs: Secondary | ICD-10-CM | POA: Insufficient documentation

## 2019-01-09 DIAGNOSIS — R197 Diarrhea, unspecified: Secondary | ICD-10-CM | POA: Diagnosis not present

## 2019-01-09 DIAGNOSIS — U071 COVID-19: Secondary | ICD-10-CM | POA: Insufficient documentation

## 2019-01-09 DIAGNOSIS — E119 Type 2 diabetes mellitus without complications: Secondary | ICD-10-CM | POA: Diagnosis not present

## 2019-01-09 DIAGNOSIS — R509 Fever, unspecified: Secondary | ICD-10-CM | POA: Diagnosis not present

## 2019-01-09 DIAGNOSIS — M7918 Myalgia, other site: Secondary | ICD-10-CM | POA: Diagnosis not present

## 2019-01-09 DIAGNOSIS — R05 Cough: Secondary | ICD-10-CM | POA: Diagnosis present

## 2019-01-09 DIAGNOSIS — R51 Headache: Secondary | ICD-10-CM | POA: Insufficient documentation

## 2019-01-09 DIAGNOSIS — F17210 Nicotine dependence, cigarettes, uncomplicated: Secondary | ICD-10-CM | POA: Insufficient documentation

## 2019-01-09 DIAGNOSIS — Z20822 Contact with and (suspected) exposure to covid-19: Secondary | ICD-10-CM

## 2019-01-09 LAB — CBG MONITORING, ED: Glucose-Capillary: 90 mg/dL (ref 70–99)

## 2019-01-09 MED ORDER — ACETAMINOPHEN 500 MG PO TABS
1000.0000 mg | ORAL_TABLET | Freq: Once | ORAL | Status: AC
  Administered 2019-01-09: 1000 mg via ORAL
  Filled 2019-01-09: qty 2

## 2019-01-09 MED ORDER — ALBUTEROL SULFATE HFA 108 (90 BASE) MCG/ACT IN AERS
2.0000 | INHALATION_SPRAY | Freq: Once | RESPIRATORY_TRACT | Status: AC
  Administered 2019-01-09: 17:00:00 2 via RESPIRATORY_TRACT
  Filled 2019-01-09: qty 6.7

## 2019-01-09 NOTE — ED Provider Notes (Signed)
[facility] EMERGENCY DEPARTMENT Provider Note   CSN: [id number] Arrival date & time: 01/09/19  1506    History   Chief Complaint Chief Complaint  Patient presents with   Fever   Cough    HPI Julie Robertson is a 43 y.o. female.     Julie Robertson is a 43 y.o. female with a history of diabetes, diabetic neuropathy, asthma, obesity and gallstones, who presents for evaluation of fever, body aches, cough, headaches and diarrhea.  Patient had known contact with a family member who they found out yesterday tested positive for coronavirus.  The patient symptoms started 2 days ago.  She denies associated chest pain or shortness of breath aside from a short few episodes of shortness of breath at night that were relieved with albuterol.  She denies any abdominal pain, nausea or vomiting.  No blood in her stools.  She has not taken any medications to treat her symptoms prior to arrival aside from albuterol.  Her husband is here with similar symptoms.  Patient also notes that her blood sugar has been a bit higher than usual over the past 2 days, ranging from 160-180.  She has been taking her diabetes medications regularly as directed.  No other aggravating or alleviating factors.     Past Medical History:  Diagnosis Date   Diabetes mellitus without complication ([place])    Diabetic neuropathy ([place]) 12/2013   vs carpal tunnell.  numbness tingling in right fingers. rx with Gabapentin.   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   Iron deficiency anemia 09/02/2015   Asthma 08/28/2015   Type 2 diabetes mellitus  ([place]) 07/31/2015   Hyperlipidemia 12/04/2013   Numbness and tingling in right hand 12/04/2013   Smoking 12/04/2013   Elevated BP 12/04/2013   Pap smear, high-risk (screening, no prior abnormality) 02/13/2013   Exposure to STD 02/13/2013    Past Surgical History:  Procedure Laterality Date   CHOLECYSTECTOMY N/A 03/10/2015   Procedure: LAPAROSCOPIC CHOLECYSTECTOMY WITH INTRAOPERATIVE CHOLANGIOGRAM;  Surgeon: Georganna Skeans, MD;  Location: [place];  Service: General;  Laterality: N/A;   ERCP N/A 03/11/2015   Procedure: ENDOSCOPIC RETROGRADE CHOLANGIOPANCREATOGRAPHY (ERCP);  Surgeon: Milus Banister, MD;  Location: [place];  Service: Endoscopy;  Laterality: N/A;   ESOPHAGOGASTRODUODENOSCOPY (EGD) WITH PROPOFOL N/A 07/25/2018   Procedure: ESOPHAGOGASTRODUODENOSCOPY (EGD) WITH PROPOFOL;  Surgeon: Rush Landmark Telford Nab., MD;  Location: [facility];  Service: Gastroenterology;  Laterality: N/A;   TUBAL LIGATION       OB History   No obstetric history on file.      Home Medications    Prior to Admission medications   Medication Sig Start Date End Date Taking? Authorizing Provider  albuterol (VENTOLIN HFA) 108 (90 Base) MCG/ACT inhaler Inhale 2 puffs into the lungs every 6 (six) hours as needed for wheezing or shortness of breath. 12/04/18   Kerin Perna, NP  atorvastatin (LIPITOR) 40 MG tablet Take 1 tablet (40 mg total) by mouth at bedtime. 12/04/18   Kerin Perna, NP  Blood Glucose Monitoring Suppl (TRUE METRIX METER) w/Device KIT Use as directed 04/12/17   Fredia Beets  R, FNP  gabapentin (NEURONTIN) 300 MG capsule Take 1 capsule (300 mg total) by mouth 3 (three) times daily. 12/04/18   Kerin Perna, NP  glipiZIDE (GLUCOTROL) 5 MG tablet Take 1 tablet (5 mg total) by mouth daily before breakfast. 12/04/18   Kerin Perna, NP  glucose blood (TRUE METRIX BLOOD GLUCOSE TEST) test strip Use as instructed 03/22/18   Fulp, Cammie, MD  ibuprofen (ADVIL) 800 MG  tablet Take 1 tablet (800 mg total) by mouth every 8 (eight) hours as needed for up to 30 days for mild pain. 12/31/18 01/30/19  Kerin Perna, NP  Insulin Pen Needle 31G X 5 MM MISC Inject 10 units subcutaneous at bedtime 04/28/16   Angelica Chessman E, MD  ipratropium (ATROVENT) 0.03 % nasal spray Place 2 sprays into both nostrils 2 (two) times daily. 12/04/18   Kerin Perna, NP  loratadine (CLARITIN) 10 MG tablet Take 1 tablet (10 mg total) by mouth daily. 12/04/18   Kerin Perna, NP  losartan (COZAAR) 25 MG tablet Take 1 tablet (25 mg total) by mouth daily. 04/19/18   Fulp, Cammie, MD  meloxicam (MOBIC) 7.5 MG tablet Take 1 tablet (7.5 mg total) by mouth daily. 11/26/18   Caccavale, Sophia, PA-C  metFORMIN (GLUCOPHAGE-XR) 500 MG 24 hr tablet Take 2 tablets (1,000 mg total) by mouth 2 (two) times daily. 12/04/18   Kerin Perna, NP  methocarbamol (ROBAXIN) 500 MG tablet Take 1 tablet (500 mg total) by mouth at bedtime as needed for muscle spasms. 11/26/18   Caccavale, Sophia, PA-C  pantoprazole (PROTONIX) 40 MG tablet Take 1 tablet (40 mg total) by mouth 2 (two) times daily. Patient not taking: Reported on 12/31/2018 07/25/18   Mansouraty, Telford Nab., MD  TRUEPLUS LANCETS 28G MISC Use as directed 03/22/18   Antony Blackbird, MD    Family History Family History  Problem Relation Age of Onset   Diabetes Mother    Heart disease Mother    Hypertension Mother    Diabetes Father    Bone cancer Maternal Grandfather    Kidney disease Maternal Uncle    Other Son        had kidney removed due to gun shot wound    Social History Social History   Tobacco Use   Smoking status: Current Every Day Smoker    Packs/day: 1.50    Years: 4.00    Pack years: 6.00    Types: Cigarettes   Smokeless tobacco: Never Used  Substance Use Topics   Alcohol use: Yes    Comment: rare   Drug use: No     Allergies   Patient has no known allergies.   Review of Systems Review of  Systems  Constitutional: Positive for chills and fever.  HENT: Negative for congestion, rhinorrhea and sore throat.   Eyes: Negative for visual disturbance.  Respiratory: Positive for cough. Negative for shortness of breath.   Cardiovascular: Negative for chest pain.  Gastrointestinal: Positive for diarrhea. Negative for abdominal pain, constipation, nausea and vomiting.  Genitourinary: Negative for dysuria and frequency.  Musculoskeletal: Positive for myalgias. Negative for arthralgias and neck pain.  Skin: Negative for color change and rash.  Neurological: Positive for headaches. Negative for dizziness, syncope and light-headedness.     Physical Exam Updated Vital Signs BP (!) 141/59 (BP Location: Left Arm)    Pulse (!) 103    Temp 99 F (37.2 C) (Oral)    Resp 19    LMP 12/24/2018 (Approximate)  SpO2 100%   Physical Exam Vitals signs and nursing note reviewed.  Constitutional:      General: She is not in acute distress.    Appearance: Normal appearance. She is well-developed. She is obese. She is not ill-appearing or diaphoretic.  HENT:     Head: Normocephalic and atraumatic.     Nose: Nose normal.     Mouth/Throat:     Mouth: Mucous membranes are moist.     Pharynx: Oropharynx is clear.     Comments: Posterior oropharynx clear and moist, no erythema, edema or exudates Eyes:     General:        Right eye: No discharge.        Left eye: No discharge.  Neck:     Musculoskeletal: Neck supple.     Comments: No rigidity Cardiovascular:     Rate and Rhythm: Normal rate and regular rhythm.     Heart sounds: Normal heart sounds.  Pulmonary:     Effort: Pulmonary effort is normal. No respiratory distress.     Breath sounds: Normal breath sounds.     Comments: Respirations equal and unlabored, patient able to speak in full sentences, lungs clear to auscultation bilaterally Abdominal:     General: Bowel sounds are normal. There is no distension.     Palpations: Abdomen is  soft. There is no mass.     Tenderness: There is no abdominal tenderness. There is no guarding.     Comments: Abdomen soft, nondistended, nontender to palpation in all quadrants without guarding or peritoneal signs  Musculoskeletal:        General: No deformity.  Lymphadenopathy:     Cervical: No cervical adenopathy.  Skin:    General: Skin is warm and dry.     Capillary Refill: Capillary refill takes less than 2 seconds.  Neurological:     Mental Status: She is alert and oriented to person, place, and time.  Psychiatric:        Mood and Affect: Mood normal.        Behavior: Behavior normal.      ED Treatments / Results  Labs (all labs ordered are listed, but only abnormal results are displayed) Labs Reviewed  NOVEL CORONAVIRUS, NAA (HOSPITAL ORDER, SEND-OUT TO REF LAB)  CBG MONITORING, ED    EKG None  Radiology Dg Chest Port 1 View  Result Date: 01/09/2019 CLINICAL DATA:  Cough. Pt reports chills/ fever, generalized fatigue, headache and cough since Monday. Highest reported fever 103, pt. Also states she had been in contact with a covid+ family member. EXAM: PORTABLE CHEST 1 VIEW COMPARISON:  03/28/2018 FINDINGS: The heart, mediastinum and hila are within normal limits. Stable granuloma in the right lateral lung base. Lungs otherwise clear. No pleural effusion or pneumothorax. Skeletal structures are grossly intact. IMPRESSION: No active disease. Electronically Signed   By: Lajean Manes M.D.   On: 01/09/2019 17:33    Procedures Procedures (including critical care time)  Medications Ordered in ED Medications  acetaminophen (TYLENOL) tablet 1,000 mg (1,000 mg Oral Given 01/09/19 1703)  albuterol (VENTOLIN HFA) 108 (90 Base) MCG/ACT inhaler 2 puff (2 puffs Inhalation Given 01/09/19 1703)     Initial Impression / Assessment and Plan / ED Course  I have reviewed the triage vital signs and the nursing notes.  Pertinent labs & imaging results that were available during my care  of the patient were reviewed by me and considered in my medical decision making (see chart for details).  Patient presents with fevers, cough, body aches, diarrhea and headaches.  Known contact with family member which recently tested positive for coronavirus.  On arrival mildly tachycardic with low-grade fever, all other vitals normal and patient is well-appearing.  Lungs clear on auscultation with no increased work of breathing.  Suspect patient has coronavirus infection but feel she will be stable for discharge home.  Outpatient coronavirus testing sent.  Chest x-ray completed here in the ED which shows no evidence of infiltrates or other active cardiopulmonary disease.  Discussed supportive care at home, as well as home quarantine recommendations with the patient.  Discussed strict return precautions and recommended purchase of home pulse ox.  Patient expresses understanding and agreement with plan.  Discharged home in good condition.  Julie Robertson was evaluated in Emergency Department on 01/09/2019 for the symptoms described in the history of present illness. She was evaluated in the context of the global COVID-19 pandemic, which necessitated consideration that the patient might be at risk for infection with the SARS-CoV-2 virus that causes COVID-19. Institutional protocols and algorithms that pertain to the evaluation of patients at risk for COVID-19 are in a state of rapid change based on information released by regulatory bodies including the CDC and federal and state organizations. These policies and algorithms were followed during the patient's care in the ED.   Final Clinical Impressions(s) / ED Diagnoses   Final diagnoses:  Suspected Covid-19 Virus Infection    ED Discharge Orders    None       Jacqlyn Larsen, [place] 01/09/19 1810    Gareth Morgan, MD 01/11/19 2036

## 2019-01-09 NOTE — ED Notes (Signed)
Pt also reports recent exposure to positive COVID family member.

## 2019-01-09 NOTE — Discharge Instructions (Signed)
You likely have the COVID-19 virus, you have a test pending will be called with results in about 2 days.  Please continue to quarantine at home and monitor your symptoms closely. You chest x-ray was clear. Antibiotics are not helpful in treating viral infection, the virus should run its course in about 10 days. Please make sure you are drinking plenty of fluids. You can treat your symptoms supportively with tylenol for fevers and pains, and over the counter cough syrups and throat lozenges to help with cough. If your symptoms are not improving please follow up with you Primary doctor.   I recommend that you purchase a home pulse ox to help better monitor your oxygen at home, if you start to have increased work of breathing or shortness of breath or your oxygen drops below 90% please immediately return to the hospital for reevaluation.  If you develop persistent fevers, shortness of breath or difficulty breathing, chest pain, severe headache and neck pain, persistent nausea and vomiting or other new or concerning symptoms return to the Emergency department.

## 2019-01-09 NOTE — ED Notes (Signed)
Patient verbalizes understanding of discharge instructions. Opportunity for questioning and answering were provided.  patient discharged from ED.  

## 2019-01-09 NOTE — ED Triage Notes (Signed)
Spanish interpreter used for triage. Pt reports chills/ fever, generalized fatigue, headache and cough since Monday. Highest reported fever 103. Pt a.o, nad noted

## 2019-01-11 LAB — NOVEL CORONAVIRUS, NAA (HOSP ORDER, SEND-OUT TO REF LAB; TAT 18-24 HRS): SARS-CoV-2, NAA: DETECTED — AB

## 2019-02-05 ENCOUNTER — Ambulatory Visit: Payer: Medicaid Other | Admitting: Orthopedic Surgery

## 2019-02-11 ENCOUNTER — Other Ambulatory Visit: Payer: Self-pay

## 2019-02-11 ENCOUNTER — Encounter (INDEPENDENT_AMBULATORY_CARE_PROVIDER_SITE_OTHER): Payer: Self-pay | Admitting: Primary Care

## 2019-02-11 ENCOUNTER — Ambulatory Visit (INDEPENDENT_AMBULATORY_CARE_PROVIDER_SITE_OTHER): Payer: Medicaid Other | Admitting: Primary Care

## 2019-02-11 VITALS — BP 139/85 | HR 87 | Ht 65.0 in

## 2019-02-11 DIAGNOSIS — K4090 Unilateral inguinal hernia, without obstruction or gangrene, not specified as recurrent: Secondary | ICD-10-CM

## 2019-02-11 DIAGNOSIS — Z794 Long term (current) use of insulin: Secondary | ICD-10-CM

## 2019-02-11 DIAGNOSIS — E782 Mixed hyperlipidemia: Secondary | ICD-10-CM

## 2019-02-11 DIAGNOSIS — Z6841 Body Mass Index (BMI) 40.0 and over, adult: Secondary | ICD-10-CM

## 2019-02-11 DIAGNOSIS — E119 Type 2 diabetes mellitus without complications: Secondary | ICD-10-CM

## 2019-02-11 DIAGNOSIS — F17210 Nicotine dependence, cigarettes, uncomplicated: Secondary | ICD-10-CM

## 2019-02-11 DIAGNOSIS — I10 Essential (primary) hypertension: Secondary | ICD-10-CM

## 2019-02-11 DIAGNOSIS — E1165 Type 2 diabetes mellitus with hyperglycemia: Secondary | ICD-10-CM

## 2019-02-11 MED ORDER — GLIPIZIDE 5 MG PO TABS: 5.0000 mg | ORAL_TABLET | Freq: Every day | ORAL | 3 refills | Status: DC

## 2019-02-11 MED ORDER — GABAPENTIN 300 MG PO CAPS: 300.0000 mg | ORAL_CAPSULE | Freq: Three times a day (TID) | ORAL | 6 refills | Status: DC

## 2019-02-11 MED ORDER — METFORMIN HCL ER 500 MG PO TB24: 1000.0000 mg | ORAL_TABLET | Freq: Two times a day (BID) | ORAL | 3 refills | Status: DC

## 2019-02-11 MED ORDER — LOSARTAN POTASSIUM 25 MG PO TABS: 25.0000 mg | ORAL_TABLET | Freq: Every day | ORAL | 3 refills | Status: DC

## 2019-02-11 MED ORDER — LANTUS SOLOSTAR 100 UNIT/ML ~~LOC~~ SOPN: 25.0000 [IU] | PEN_INJECTOR | Freq: Every day | SUBCUTANEOUS | 99 refills | Status: DC

## 2019-02-11 MED ORDER — ATORVASTATIN CALCIUM 40 MG PO TABS: 40.0000 mg | ORAL_TABLET | Freq: Every day | ORAL | 3 refills | Status: DC

## 2019-02-11 NOTE — Progress Notes (Signed)
Established Patient Office Visit  Subjective:  Patient ID: Julie Robertson, female    DOB: Jun 20, 1976  Age: 43 y.o. MRN: [id number]  CC:  Chief Complaint  Patient presents with  . review diabetes log    HPI Julie Robertson presents for follow up diabetes she thought today was Julie Robertson A1C was due. She also complaints of abdominal pain. She denies shortness of breath, headaches, chest pain or lower extremity edema  Past Medical History:  Diagnosis Date  . Diabetes mellitus without complication ([place])   . Diabetic neuropathy ([place]) 12/2013   vs carpal tunnell.  numbness tingling in right fingers. rx with Gabapentin.  Marland Kitchen Dyslipidemia 08/2009  . Gall stones 08/2009  . Obesity    BMI 41, 250# 03/2015    Past Surgical History:  Procedure Laterality Date  . CHOLECYSTECTOMY N/A 03/10/2015   Procedure: LAPAROSCOPIC CHOLECYSTECTOMY WITH INTRAOPERATIVE CHOLANGIOGRAM;  Surgeon: Georganna Skeans, MD;  Location: [facility];  Service: General;  Laterality: N/A;  . ERCP N/A 03/11/2015   Procedure: ENDOSCOPIC RETROGRADE CHOLANGIOPANCREATOGRAPHY (ERCP);  Surgeon: Milus Banister, MD;  Location: [facility];  Service: Endoscopy;  Laterality: N/A;  . ESOPHAGOGASTRODUODENOSCOPY (EGD) WITH PROPOFOL N/A 07/25/2018   Procedure: ESOPHAGOGASTRODUODENOSCOPY (EGD) WITH PROPOFOL;  Surgeon: Rush Landmark Telford Nab., MD;  Location: [facility];  Service: Gastroenterology;  Laterality: N/A;  . TUBAL LIGATION      Family History  Problem Relation Age of Onset  . Diabetes Mother   . Heart disease Mother   . Hypertension Mother   . Diabetes Father   . Bone cancer Maternal Grandfather   . Kidney disease Maternal Uncle   . Other Son        had kidney removed due to gun shot wound    Social History   Socioeconomic History  . Marital status: Divorced    Spouse name: Not on file  . Number of children: 2  . Years of education: Not on file  . Highest education level: Not on file  Occupational History  .  Occupation: Development worker, community  Social Needs  . Financial resource strain: Not on file  . Food insecurity    Worry: Not on file    Inability: Not on file  . Transportation needs    Medical: Not on file    Non-medical: Not on file  Tobacco Use  . Smoking status: Current Every Day Smoker    Packs/day: 1.50    Years: 4.00    Pack years: 6.00    Types: Cigarettes  . Smokeless tobacco: Never Used  Substance and Sexual Activity  . Alcohol use: Yes    Comment: rare  . Drug use: No  . Sexual activity: Not on file  Lifestyle  . Physical activity    Days per week: Not on file    Minutes per session: Not on file  . Stress: Not on file  Relationships  . Social Herbalist on phone: Not on file    Gets together: Not on file    Attends religious service: Not on file    Active member of club or organization: Not on file    Attends meetings of clubs or organizations: Not on file    Relationship status: Not on file  . Intimate partner violence    Fear of current or ex partner: Not on file    Emotionally abused: Not on file    Physically abused: Not on file    Forced sexual activity: Not on file  Other Topics Concern  . Not on file  Social History Narrative   Patient has 2 sons one is 59, the other 22 as of 03/2015. Julie Robertson kids are not the children of Julie Robertson current husband. As of 03/2015 she is not employed outside the home.    Outpatient Medications Prior to Visit  Medication Sig Dispense Refill  . Blood Glucose Monitoring Suppl (TRUE METRIX METER) w/Device KIT Use as directed 1 kit 0  . glucose blood (TRUE METRIX BLOOD GLUCOSE TEST) test strip Use as instructed 100 each 12  . Insulin Pen Needle 31G X 5 MM MISC Inject 10 units subcutaneous at bedtime 100 each 0  . ipratropium (ATROVENT) 0.03 % nasal spray Place 2 sprays into both nostrils 2 (two) times daily. 30 mL 3  . loratadine (CLARITIN) 10 MG tablet Take 1 tablet (10 mg total) by mouth daily. 30 tablet 11  . meloxicam (MOBIC)  7.5 MG tablet Take 1 tablet (7.5 mg total) by mouth daily. 14 tablet 0  . methocarbamol (ROBAXIN) 500 MG tablet Take 1 tablet (500 mg total) by mouth at bedtime as needed for muscle spasms. 10 tablet 0  . TRUEPLUS LANCETS 28G MISC Use as directed 100 each 12  . atorvastatin (LIPITOR) 40 MG tablet Take 1 tablet (40 mg total) by mouth at bedtime. 30 tablet 2  . gabapentin (NEURONTIN) 300 MG capsule Take 1 capsule (300 mg total) by mouth 3 (three) times daily. 90 capsule 6  . glipiZIDE (GLUCOTROL) 5 MG tablet Take 1 tablet (5 mg total) by mouth daily before breakfast. 30 tablet 3  . losartan (COZAAR) 25 MG tablet Take 1 tablet (25 mg total) by mouth daily. 30 tablet 11  . metFORMIN (GLUCOPHAGE-XR) 500 MG 24 hr tablet Take 2 tablets (1,000 mg total) by mouth 2 (two) times daily. 60 tablet 6  . albuterol (VENTOLIN HFA) 108 (90 Base) MCG/ACT inhaler Inhale 2 puffs into the lungs every 6 (six) hours as needed for wheezing or shortness of breath. 1 Inhaler 1  . pantoprazole (PROTONIX) 40 MG tablet Take 1 tablet (40 mg total) by mouth 2 (two) times daily. (Patient not taking: Reported on 12/31/2018) 60 tablet 3   Facility-Administered Medications Prior to Visit  Medication Dose Route Frequency Provider Last Rate Last Dose  . 0.9 %  sodium chloride infusion  500 mL Intravenous Once Nandigam, Kavitha V, MD        No Known Allergies  ROS Review of Systems  All other systems reviewed and are negative.     Objective:    Physical Exam  Constitutional: She is oriented to person, place, and time. She appears well-developed and well-nourished.  HENT:  Head: Normocephalic.  Neck: Normal range of motion. Neck supple.  Cardiovascular: Normal rate and regular rhythm.  Pulmonary/Chest: Effort normal and breath sounds normal.  Abdominal: Soft. Bowel sounds are normal. She exhibits distension.  Umbilical hernia   Musculoskeletal: Normal range of motion.  Neurological: She is oriented to person, place, and  time.  Skin: Skin is warm and dry.  Psychiatric: She has a normal mood and affect.    BP 139/85 (BP Location: Right Arm, Patient Position: Sitting, Cuff Size: Normal)   Pulse 87   Ht _0  (1.651 m)   LMP 01/16/2019 (Approximate)   SpO2 97%   BMI 42.00 kg/m  Wt Readings from Last 3 Encounters:  12/31/18 252 lb 6.4 oz (114.5 kg)  07/25/18 262 lb (118.8 kg)  06/11/18 261 lb (118.4 kg)  Health Maintenance Due  Topic Date Due  . OPHTHALMOLOGY EXAM  12/01/1984  . FOOT EXAM  08/25/2017  . PAP SMEAR-Modifier  07/22/2018  . INFLUENZA VACCINE  02/02/2019    There are no preventive care reminders to display for this patient.  Lab Results  Component Value Date   TSH 3.090 03/22/2018   Lab Results  Component Value Date   WBC 7.2 12/04/2018   HGB 10.1 (L) 12/04/2018   HCT 34.0 12/04/2018   MCV 69 (L) 12/04/2018   PLT 379 12/04/2018   Lab Results  Component Value Date   NA 135 12/04/2018   K 4.6 12/04/2018   CO2 21 12/04/2018   GLUCOSE 229 (H) 12/04/2018   BUN 16 12/04/2018   CREATININE 0.75 12/04/2018   BILITOT 0.3 12/04/2018   ALKPHOS 83 12/04/2018   AST 29 12/04/2018   ALT 37 (H) 12/04/2018   PROT 6.7 12/04/2018   ALBUMIN 3.8 12/04/2018   CALCIUM 9.3 12/04/2018   ANIONGAP 9 05/14/2015   Lab Results  Component Value Date   CHOL 143 12/04/2018   Lab Results  Component Value Date   HDL 37 (L) 12/04/2018   Lab Results  Component Value Date   LDLCALC 61 12/04/2018   Lab Results  Component Value Date   TRIG 224 (H) 12/04/2018   Lab Results  Component Value Date   CHOLHDL 3.9 12/04/2018   Lab Results  Component Value Date   HGBA1C 10.2 (H) 12/04/2018      Assessment & Plan:   Problem List Items Addressed This Visit    Hyperlipidemia   Relevant Medications   atorvastatin (LIPITOR) 40 MG tablet   losartan (COZAAR) 25 MG tablet   Morbid (severe) obesity due to excess calories (HCC)   Relevant Medications   glipiZIDE (GLUCOTROL) 5 MG tablet    metFORMIN (GLUCOPHAGE-XR) 500 MG 24 hr tablet   Insulin Glargine (LANTUS SOLOSTAR) 100 UNIT/ML Solostar Pen   Uncontrolled type 2 diabetes mellitus with hyperglycemia, with long-term current use of insulin (HCC) - Primary   Relevant Medications   glipiZIDE (GLUCOTROL) 5 MG tablet   atorvastatin (LIPITOR) 40 MG tablet   gabapentin (NEURONTIN) 300 MG capsule   losartan (COZAAR) 25 MG tablet   metFORMIN (GLUCOPHAGE-XR) 500 MG 24 hr tablet   Insulin Glargine (LANTUS SOLOSTAR) 100 UNIT/ML Solostar Pen   Other Relevant Orders   Ambulatory referral to Ophthalmology   Essential hypertension   Relevant Medications   atorvastatin (LIPITOR) 40 MG tablet   losartan (COZAAR) 25 MG tablet    Other Visit Diagnoses    Encounter for diabetic foot exam ([place])       Relevant Medications   glipiZIDE (GLUCOTROL) 5 MG tablet   atorvastatin (LIPITOR) 40 MG tablet   losartan (COZAAR) 25 MG tablet   metFORMIN (GLUCOPHAGE-XR) 500 MG 24 hr tablet   Insulin Glargine (LANTUS SOLOSTAR) 100 UNIT/ML Solostar Pen   Unilateral inguinal hernia without obstruction or gangrene, recurrence not specified       Relevant Orders   Ambulatory referral to [facility] was seen today for review diabetes log.  Diagnoses and all orders for this visit:  Uncontrolled type 2 diabetes mellitus with hyperglycemia, with long-term current use of insulin ([place]) ADA recommends the following therapeutic goals for glycemic control related to A1c measurements: Goal of therapy: Less than 6.5 hemoglobin A1c.  Reference clinical practice recommendations. Foods that are high in carbohydrates are the following rice, potatoes, breads, sugars, and pastas.  Reduction in the intake (eating) will assist in lowering your blood sugars. -     Ambulatory referral to Ophthalmology -     glipiZIDE (GLUCOTROL) 5 MG tablet; Take 1 tablet (5 mg total) by mouth daily before breakfast. -     gabapentin (NEURONTIN) 300 MG capsule; Take 1 capsule  (300 mg total) by mouth 3 (three) times daily. -     losartan (COZAAR) 25 MG tablet; Take 1 tablet (25 mg total) by mouth daily. -     metFORMIN (GLUCOPHAGE-XR) 500 MG 24 hr tablet; Take 2 tablets (1,000 mg total) by mouth 2 (two) times daily.  Encounter for diabetic foot exam ([place]) Sensory exam of the foot is normal, tested with the monofilament. Good pulses, no lesions or ulcers, good peripheral pulses.  Essential hypertension Counseled on blood pressure goal of less than 130/80, low-sodium, DASH diet, medication compliance, 150 minutes of moderate intensity exercise per week. Discussed medication compliance, adverse effects.  Morbid (severe) obesity due to excess calories (HCC) Morbid Obesity is 42  indicating an excess in caloric intake or underlining conditions. This may lead to other co-morbidities. Lifestyle modifications of diet and exercise may reduce obesity.   Mixed hyperlipidemia Avoid foods high in cholesterol or liver, fatty meats,cheese, butter avocados, nuts and seeds, chocolate and fried foods. -     atorvastatin (LIPITOR) 40 MG tablet; Take 1 tablet (40 mg total) by mouth at bedtime.  Other orders -     Insulin Glargine (LANTUS SOLOSTAR) 100 UNIT/ML Solostar Pen; Inject 25 Units into the skin daily.    Meds ordered this encounter  Medications  . glipiZIDE (GLUCOTROL) 5 MG tablet    Sig: Take 1 tablet (5 mg total) by mouth daily before breakfast.    Dispense:  30 tablet    Refill:  3  . atorvastatin (LIPITOR) 40 MG tablet    Sig: Take 1 tablet (40 mg total) by mouth at bedtime.    Dispense:  30 tablet    Refill:  3  . gabapentin (NEURONTIN) 300 MG capsule    Sig: Take 1 capsule (300 mg total) by mouth 3 (three) times daily.    Dispense:  90 capsule    Refill:  6  . losartan (COZAAR) 25 MG tablet    Sig: Take 1 tablet (25 mg total) by mouth daily.    Dispense:  30 tablet    Refill:  3  . metFORMIN (GLUCOPHAGE-XR) 500 MG 24 hr tablet    Sig: Take 2 tablets  (1,000 mg total) by mouth 2 (two) times daily.    Dispense:  60 tablet    Refill:  3  . Insulin Glargine (LANTUS SOLOSTAR) 100 UNIT/ML Solostar Pen    Sig: Inject 25 Units into the skin daily.    Dispense:  5 pen    Refill:  PRN    Follow-up: Return in about 4 weeks (around 03/11/2019) for Diabetes A1C , HTN, fasting labs.    Kerin Perna, NP

## 2019-02-11 NOTE — Patient Instructions (Signed)
Hernia umbilical en los adultos Umbilical Hernia, Adult  Una hernia es un bulto de tejido que sobresale a travs de una abertura [employer]. Una hernia umbilical se produce en el abdomen, cerca del ombligo. La hernia puede contener tejidos del intestino delgado, el intestino grueso o tejido graso que recubre el intestino (epipln). Las hernias [employer] en los adultos suelen empeorar con el tiempo y requieren tratamiento quirrgico. Hay varios tipos de hernias umbilicales. Es posible que tenga lo siguiente:  Una hernia ubicada justo por debajo o por arriba del ombligo (hernia indirecta). Es el tipo de hernia umbilical ms frecuente en los adultos.  Una hernia que se forma a travs de una abertura hecha por el ombligo (hernia directa).  Una hernia que aparece y desaparece (hernia reducible). Una hernia reducible puede ser visible solo al hacer fuerza, levantar objetos pesados o toser. Este tipo de hernia se puede reintroducir en el abdomen (reducir).  Una hernia que aprisiona tejido abdominal (hernia encarcelada). Este tipo de hernia es irreducible.  Una hernia que interrumpe el flujo de sangre a los tejidos en su interior (hernia estrangulada). Si esto ocurre, los tejidos Radio broadcast assistant a Pharmacologist. Este tipo de hernia requiere tratamiento de Freight forwarder. Cules son las causas? Una hernia umbilical se produce cuando el tejido dentro del abdomen ejerce presin sobre una zona debilitada de los msculos abdominales. Qu incrementa el riesgo? Puede correr un mayor riesgo de Best boy esta afeccin en los siguientes casos:  Tiene obesidad.  Tuvo varios embarazos.  Tiene una acumulacin de lquido dentro del abdomen (ascitis).  Se someti a una ciruga que [employer] abdominales. Cules son los signos o sntomas? El principal sntoma de esta afeccin es un bulto en el ombligo o cerca de este que no causa dolor. Una hernia reducible puede ser visible solo al hacer fuerza, levantar  objetos pesados o toser. Otros sntomas pueden incluir lo siguiente:  Dolor sordo.  Sensacin de opresin. Los sntomas de una hernia estrangulada pueden incluir los siguientes:  Dolor que se vuelve cada vez ms intenso.  Nuseas y vmitos.  Dolor al ejercer presin sobre la hernia.  Cambio de color de la piel que recubre la hernia que se torna roja o violcea.  Estreimiento.  Sangre en las heces. Cmo se diagnostica? Esta afeccin se puede diagnosticar en funcin de lo siguiente:  Un examen fsico. Pueden pedirle que tosa o que haga fuerza mientras est de pie. Estas acciones aumentan la presin dentro del abdomen y empujan la hernia a travs de la abertura en los msculos. El mdico puede ejercer presin sobre la hernia para tratar de reducirla.  Los sntomas y los antecedentes mdicos. Cmo se trata? La ciruga es el nico tratamiento para una hernia umbilical. En el caso de que la hernia est estrangulada, esta se realiza lo antes posible. Si tiene una hernia pequea que no est encarcelada, tal vez tenga que bajar de peso antes de la [place]. Siga estas indicaciones en su casa:  Baje de peso, si se lo indic el mdico.  No trate de reintroducir la hernia.  Observe si la hernia cambia de color o de tamao. Infrmele al mdico si se producen cambios.  Tal vez deba evitar las actividades que aumentan la presin sobre la hernia.  No levante objetos que pesen ms de 10libras (4.5kg) hasta que el mdico le diga que es seguro.  Tome los medicamentos de venta libre y los recetados solamente como se lo haya indicado el mdico.  [place] a  todas las visitas de control como se lo haya indicado el mdico. Esto es importante. Comunquese con un mdico si:  La hernia se agranda.  La hernia le causa dolor. Solicite ayuda de inmediato si:  Tiene un dolor intenso y repentino cerca de la zona de la hernia.  Tiene dolor, as como nuseas o vmitos.  Tiene dolor y la piel  que recubre la hernia cambia de color.  Presenta fiebre. Esta informacin no tiene Marine scientist el consejo del mdico. Asegrese de hacerle al mdico cualquier pregunta que tenga. Document Released: 02/25/2016 Document Revised: 10/02/2017 Document Reviewed: 03/27/2017 Elsevier Patient Education  2020 [employer].

## 2019-02-12 ENCOUNTER — Other Ambulatory Visit: Payer: Self-pay | Admitting: Pharmacist

## 2019-02-12 DIAGNOSIS — E1165 Type 2 diabetes mellitus with hyperglycemia: Secondary | ICD-10-CM

## 2019-02-12 MED ORDER — BASAGLAR KWIKPEN 100 UNIT/ML ~~LOC~~ SOPN: 50.0000 [IU] | PEN_INJECTOR | Freq: Every day | SUBCUTANEOUS | 2 refills | Status: DC

## 2019-03-04 ENCOUNTER — Other Ambulatory Visit: Payer: Self-pay | Admitting: Family Medicine

## 2019-03-04 DIAGNOSIS — K219 Gastro-esophageal reflux disease without esophagitis: Secondary | ICD-10-CM

## 2019-03-05 ENCOUNTER — Other Ambulatory Visit: Payer: Self-pay | Admitting: Family Medicine

## 2019-03-05 DIAGNOSIS — K219 Gastro-esophageal reflux disease without esophagitis: Secondary | ICD-10-CM

## 2019-03-06 ENCOUNTER — Ambulatory Visit (INDEPENDENT_AMBULATORY_CARE_PROVIDER_SITE_OTHER): Payer: Medicaid Other | Admitting: Primary Care

## 2019-03-12 ENCOUNTER — Ambulatory Visit (INDEPENDENT_AMBULATORY_CARE_PROVIDER_SITE_OTHER): Payer: Self-pay | Admitting: Surgery

## 2019-03-12 ENCOUNTER — Other Ambulatory Visit: Payer: Self-pay

## 2019-03-12 ENCOUNTER — Other Ambulatory Visit
Admission: RE | Admit: 2019-03-12 | Discharge: 2019-03-12 | Disposition: A | Discharge status: Home / Self Care | Payer: Medicaid Other | Source: Ambulatory Visit | Attending: Surgery | Admitting: Surgery

## 2019-03-12 ENCOUNTER — Encounter: Payer: Self-pay | Admitting: Surgery

## 2019-03-12 VITALS — BP 120/76 | HR 83 | Temp 97.9°F | Resp 16 | Ht 62.0 in | Wt 247.2 lb

## 2019-03-12 DIAGNOSIS — K429 Umbilical hernia without obstruction or gangrene: Secondary | ICD-10-CM

## 2019-03-12 LAB — BUN: BUN: 13 mg/dL (ref 6–20)

## 2019-03-12 LAB — CREATININE, SERUM
Creatinine, Ser: 0.67 mg/dL (ref 0.44–1.00)
GFR calc Af Amer: >60 mL/min (ref >60)
GFR calc non Af Amer: >60 mL/min (ref >60)

## 2019-03-12 NOTE — Patient Instructions (Addendum)
CT scheduled 03/22/2019 @ 9 am. Please see location address below. Please go to the lab today at [facility] entrance to have blood drawn. Please do not have food/drink 4 hours prior to the test. Please go to the Radiology department to pick up your prep kit.  [facility] [place] [id number]  Please see your follow up appointment listed below.   Umbilical Hernia, Adult  A hernia is a bulge of tissue that pushes through an opening between muscles. An umbilical hernia happens in the abdomen, near the belly button (umbilicus). The hernia may contain tissues from the small intestine, large intestine, or fatty tissue covering the intestines (omentum). Umbilical hernias in adults tend to get worse over time, and they require surgical treatment. There are several types of umbilical hernias. You may have:  A hernia located just above or below the umbilicus (indirect hernia). This is the most common type of umbilical hernia in adults.  A hernia that forms through an opening formed by the umbilicus (direct hernia).  A hernia that comes and goes (reducible hernia). A reducible hernia may be visible only when you strain, lift something heavy, or cough. This type of hernia can be pushed back into the abdomen (reduced).  A hernia that traps abdominal tissue inside the hernia (incarcerated hernia). This type of hernia cannot be reduced.  A hernia that cuts off blood flow to the tissues inside the hernia (strangulated hernia). The tissues can start to die if this happens. This type of hernia requires emergency treatment. What are the causes? An umbilical hernia happens when tissue inside the abdomen presses on a weak area of the abdominal muscles. What increases the risk? You may have a greater risk of this condition if you:  Are obese.  Have had several pregnancies.  Have a buildup of fluid inside your abdomen (ascites).  Have had surgery that weakens the  abdominal muscles. What are the signs or symptoms? The main symptom of this condition is a painless bulge at or near the belly button. A reducible hernia may be visible only when you strain, lift something heavy, or cough. Other symptoms may include:  Dull pain.  A feeling of pressure. Symptoms of a strangulated hernia may include:  Pain that gets increasingly worse.  Nausea and vomiting.  Pain when pressing on the hernia.  Skin over the hernia becoming red or purple.  Constipation.  Blood in the stool. How is this diagnosed? This condition may be diagnosed based on:  A physical exam. You may be asked to cough or strain while standing. These actions increase the pressure inside your abdomen and force the hernia through the opening in your muscles. Your health care provider may try to reduce the hernia by pressing on it.  Your symptoms and medical history. How is this treated? Surgery is the only treatment for an umbilical hernia. Surgery for a strangulated hernia is done as soon as possible. If you have a small hernia that is not incarcerated, you may need to lose weight before having surgery. Follow these instructions at home:  Lose weight, if told by your health care provider.  Do not try to push the hernia back in.  Watch your hernia for any changes in color or size. Tell your health care provider if any changes occur.  You may need to avoid activities that increase pressure on your hernia.  Do not lift anything that is heavier than 10 lb (4.5 kg) until your health care  provider says that this is safe.  Take over-the-counter and prescription medicines only as told by your health care provider.  Keep all follow-up visits as told by your health care provider. This is important. Contact a health care provider if:  Your hernia gets larger.  Your hernia becomes painful. Get help right away if:  You develop sudden, severe pain near the area of your hernia.  You have  pain as well as nausea or vomiting.  You have pain and the skin over your hernia changes color.  You develop a fever. This information is not intended to replace advice given to you by your health care provider. Make sure you discuss any questions you have with your health care provider. Document Released: 11/20/2015 Document Revised: 08/02/2017 Document Reviewed: 12/19/2016 Elsevier Patient Education  2020 [employer].

## 2019-03-12 NOTE — Progress Notes (Signed)
03/13/2019  Reason for Visit:  Umbilical hernia  Referring Provider:  Juluis Mire, NP  History of Present Illness: Julie Robertson is a 43 y.o. female presenting for evaluation of an umbilical hernia.  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  Intraoperatively, she was found to have an umbilical hernia which was repaired in the same incision.    She presents today for evaluation of an umbilical hernia.  The patient reports that she's noted a bulging area superior to the umbilicus for about 3 months now.  When the area bulges, she feels some discomfort and burning sensation.  Denies any nausea or vomiting, constipation or diarrhea.  She used to have constipation but drinks "teas" to help keep her regular.  She reports that when she coughs or sneezes, the bulge protrudes more and she feels the discomfort.  She also reports feeling discomfort in the lower abdomen, and attributes this to excess skin that pulls down on her.  She has been interested in plastic surgery but knows that it would be expensive as it is cosmetic surgery.  Past Medical History: Past Medical History:  Diagnosis Date  . Diabetes mellitus without complication ([place])   . Diabetic neuropathy ([place]) 12/2013   vs carpal tunnell.  numbness tingling in right fingers. rx with Gabapentin.  Marland Kitchen Dyslipidemia 08/2009  . Gall stones 08/2009  . Obesity    BMI 41, 250# 03/2015     Past Surgical History: Past Surgical History:  Procedure Laterality Date  . CHOLECYSTECTOMY N/A 03/10/2015   Procedure: LAPAROSCOPIC CHOLECYSTECTOMY WITH INTRAOPERATIVE CHOLANGIOGRAM;  Surgeon: Georganna Skeans, MD;  Location: [place];  Service: General;  Laterality: N/A;  . ERCP N/A 03/11/2015   Procedure: ENDOSCOPIC RETROGRADE CHOLANGIOPANCREATOGRAPHY (ERCP);  Surgeon: Milus Banister, MD;  Location: [place];  Service: Endoscopy;  Laterality: N/A;  .  ESOPHAGOGASTRODUODENOSCOPY (EGD) WITH PROPOFOL N/A 07/25/2018   Procedure: ESOPHAGOGASTRODUODENOSCOPY (EGD) WITH PROPOFOL;  Surgeon: Rush Landmark Telford Nab., MD;  [facility];  Service: Gastroenterology;  Laterality: N/A;  . Transduodenal Ampullectomy  09/2015  . TUBAL LIGATION      Home Medications: Prior to Admission medications   Medication Sig Start Date End Date Taking? Authorizing Provider  albuterol (VENTOLIN HFA) 108 (90 Base) MCG/ACT inhaler Inhale 2 puffs into the lungs every 6 (six) hours as needed for wheezing or shortness of breath. 12/04/18  Yes Kerin Perna, NP  atorvastatin (LIPITOR) 40 MG tablet Take 1 tablet (40 mg total) by mouth at bedtime. 02/11/19  Yes Kerin Perna, NP  Blood Glucose Monitoring Suppl (TRUE METRIX METER) w/Device KIT Use as directed 04/12/17  Yes Hairston, Mandesia R, FNP  gabapentin (NEURONTIN) 300 MG capsule Take 1 capsule (300 mg total) by mouth 3 (three) times daily. 02/11/19  Yes Kerin Perna, NP  glipiZIDE (GLUCOTROL) 5 MG tablet Take 1 tablet (5 mg total) by mouth daily before breakfast. 02/11/19  Yes Kerin Perna, NP  glucose blood (TRUE METRIX BLOOD GLUCOSE TEST) test strip Use as instructed 03/22/18  Yes Fulp, Cammie, MD  Insulin Glargine (BASAGLAR KWIKPEN) 100 UNIT/ML SOPN Inject 0.5 mLs (50 Units total) into the skin at bedtime. 02/12/19  Yes Charlott Rakes, MD  Insulin Pen Needle 31G X 5 MM MISC Inject 10 units subcutaneous at bedtime 04/28/16  Yes Jegede, Olugbemiga E, MD  ipratropium (ATROVENT) 0.03 % nasal spray Place 2 sprays into both nostrils 2 (two) times daily. 12/04/18  Yes Kerin Perna, NP  loratadine (CLARITIN) 10 MG tablet Take 1 tablet (10 mg total) by mouth daily. 12/04/18  Yes Kerin Perna, NP  losartan (COZAAR) 25 MG tablet Take 1 tablet (25 mg total) by mouth daily. 02/11/19  Yes Kerin Perna, NP  meloxicam (MOBIC) 7.5 MG tablet Take 1 tablet (7.5 mg total) by mouth daily. 11/26/18   Yes Caccavale, Sophia, PA-C  metFORMIN (GLUCOPHAGE-XR) 500 MG 24 hr tablet Take 2 tablets (1,000 mg total) by mouth 2 (two) times daily. 02/11/19  Yes Kerin Perna, NP  methocarbamol (ROBAXIN) 500 MG tablet Take 1 tablet (500 mg total) by mouth at bedtime as needed for muscle spasms. 11/26/18  Yes Caccavale, Sophia, PA-C  pantoprazole (PROTONIX) 40 MG tablet TAKE 1 TABLET (40 MG TOTAL) BY MOUTH DAILY. 03/06/19  Yes Charlott Rakes, MD  TRUEPLUS LANCETS 28G MISC Use as directed 03/22/18  Yes Fulp, Cammie, MD    Allergies: No Known Allergies  Social History:  reports that she has been smoking cigarettes. She has a 6.00 pack-year smoking history. She has never used smokeless tobacco. She reports current alcohol use. She reports that she does not use drugs.   Family History: Family History  Problem Relation Age of Onset  . Diabetes Mother   . Heart disease Mother   . Hypertension Mother   . Diabetes Father   . Bone cancer Maternal Grandfather   . Kidney disease Maternal Uncle   . Other Son        had kidney removed due to gun shot wound    Review of Systems: Review of Systems  Constitutional: Negative for chills and fever.  HENT: Negative for hearing loss.   Eyes: Negative for blurred vision.  Respiratory: Negative for shortness of breath.   Cardiovascular: Negative for chest pain.  Gastrointestinal: Positive for abdominal pain. Negative for constipation, diarrhea, nausea and vomiting.  Genitourinary: Negative for dysuria.  Musculoskeletal: Negative for myalgias.  Skin: Negative for rash.  Neurological: Negative for dizziness.  Psychiatric/Behavioral: Negative for depression.    Physical Exam BP 120/76   Pulse 83   Temp 97.9 F (36.6 C) (Temporal)   Resp 16   Ht 5' 2"  (1.575 m)   Wt 247 lb 3.2 oz (112.1 kg)   SpO2 99%   BMI 45.21 kg/m  CONSTITUTIONAL: No acute distress HEENT:  Normocephalic, atraumatic, extraocular motion intact. NECK: Trachea is midline, and there  is no jugular venous distension.  RESPIRATORY:  Lungs are clear, and breath sounds are equal bilaterally. Normal respiratory effort without pathologic use of accessory muscles. CARDIOVASCULAR: Heart is regular without murmurs, gallops, or rubs. GI: The abdomen is soft, obese, non-distended, with mild discomfort to palpation of her supraumbilical incisional hernia.  Well healed upper midline incision.  The patient has a reducible hernia, superior to the umbilicus, in the midline.  It protrudes with coughing, but is easily reducible.  Lower abdomen has large pannus, but no palpable defects.  No palpable inguinal hernias on either side.  However, her body habitus does impede a thorough assessment. MUSCULOSKELETAL:  Normal muscle strength and tone in all four extremities.  No peripheral edema or cyanosis. SKIN: Skin turgor is normal. There are no pathologic skin lesions.  NEUROLOGIC:  Motor and sensation is grossly normal.  Cranial nerves are grossly intact. PSYCH:  Alert and oriented to person, place and time. Affect is normal.  Laboratory Analysis: No results found for this or any previous visit (from the past 24 hour(s)).  Imaging: No results found.  Assessment and Plan: This is a 43 y.o. female with incisional hernia just superior to the umbilicus.  Discussed with the patient that she has an incisional hernia at the inferior portion of her midline incision, just superior to the umbilicus.  Though multifactorial in etiology, discussed that exertion and her obesity does put her at risk for post-op hernias.  Given her body habitus, I am unable to fully determine the size of the hernia and if there is anything of concern in the lower abdomen where she mentions discomfort at the pannus level.  Discussed with her that to better assess for any other hernias or other issues, will obtain CT scan of abdomen and pelvis with contrast.  She will then follow up with me after CT is done to discuss results and  possible surgery.  Face-to-face time spent with the patient and care providers was 60 minutes, with more than 50% of the time spent counseling, educating, and coordinating care of the patient.     Melvyn Neth, [facility]

## 2019-03-13 ENCOUNTER — Encounter: Payer: Self-pay | Admitting: Surgery

## 2019-03-22 ENCOUNTER — Ambulatory Visit
Admission: RE | Admit: 2019-03-22 | Discharge: 2019-03-22 | Disposition: A | Discharge status: Home / Self Care | Payer: Self-pay | Source: Ambulatory Visit | Attending: Surgery | Admitting: Surgery

## 2019-03-22 ENCOUNTER — Other Ambulatory Visit: Payer: Self-pay

## 2019-03-22 DIAGNOSIS — K429 Umbilical hernia without obstruction or gangrene: Secondary | ICD-10-CM | POA: Insufficient documentation

## 2019-03-22 HISTORY — DX: Unspecified asthma, uncomplicated: J45.909

## 2019-03-22 MED ORDER — IOHEXOL 300 MG/ML  SOLN
100.0000 mL | Freq: Once | INTRAMUSCULAR | Status: AC | PRN
  Administered 2019-03-22: 10:00:00 100 mL via INTRAVENOUS

## 2019-03-26 ENCOUNTER — Other Ambulatory Visit: Payer: Self-pay

## 2019-03-26 ENCOUNTER — Encounter (INDEPENDENT_AMBULATORY_CARE_PROVIDER_SITE_OTHER): Payer: Self-pay | Admitting: Primary Care

## 2019-03-26 ENCOUNTER — Ambulatory Visit (INDEPENDENT_AMBULATORY_CARE_PROVIDER_SITE_OTHER): Payer: Self-pay | Admitting: Primary Care

## 2019-03-26 VITALS — BP 131/85 | HR 63 | Temp 97.3°F | Ht 65.0 in | Wt 252.8 lb

## 2019-03-26 DIAGNOSIS — E119 Type 2 diabetes mellitus without complications: Secondary | ICD-10-CM

## 2019-03-26 DIAGNOSIS — E782 Mixed hyperlipidemia: Secondary | ICD-10-CM

## 2019-03-26 DIAGNOSIS — Z23 Encounter for immunization: Secondary | ICD-10-CM

## 2019-03-26 DIAGNOSIS — K219 Gastro-esophageal reflux disease without esophagitis: Secondary | ICD-10-CM

## 2019-03-26 DIAGNOSIS — Z794 Long term (current) use of insulin: Secondary | ICD-10-CM

## 2019-03-26 DIAGNOSIS — F17200 Nicotine dependence, unspecified, uncomplicated: Secondary | ICD-10-CM

## 2019-03-26 DIAGNOSIS — B351 Tinea unguium: Secondary | ICD-10-CM

## 2019-03-26 DIAGNOSIS — E1165 Type 2 diabetes mellitus with hyperglycemia: Secondary | ICD-10-CM

## 2019-03-26 LAB — POCT GLYCOSYLATED HEMOGLOBIN (HGB A1C): Hemoglobin A1C: 7.9 % — AB (ref 4.0–5.6)

## 2019-03-26 LAB — GLUCOSE, POCT (MANUAL RESULT ENTRY): POC Glucose: 78 mg/dl (ref 70–99)

## 2019-03-26 MED ORDER — OMEPRAZOLE 20 MG PO CPDR: 20.0000 mg | DELAYED_RELEASE_CAPSULE | Freq: Every day | ORAL | 3 refills | Status: DC

## 2019-03-26 NOTE — Progress Notes (Signed)
Established Patient Office Visit  Subjective:  Patient ID: Julie Robertson, female    DOB: Mar 22, 1976  Age: 43 y.o. MRN: [id number]  CC:  Chief Complaint  Patient presents with  . Follow-up    DM  . Gastroesophageal Reflux    HPI Julie Robertson presents for further management of her type 2 diabetes, diabetes neuropathy dyslipidemia and complaints of abdominal pain.  Hypertension today 131/85 She denies shortness of breath, headaches, chest pain or lower extremity edema.  Past Medical History:  Diagnosis Date  . Asthma   . Diabetes mellitus without complication ([place])   . Diabetic neuropathy ([place]) 12/2013   vs carpal tunnell.  numbness tingling in right fingers. rx with Gabapentin.  Marland Kitchen Dyslipidemia 08/2009  . Gall stones 08/2009  . Obesity    BMI 41, 250# 03/2015    Past Surgical History:  Procedure Laterality Date  . CHOLECYSTECTOMY N/A 03/10/2015   Procedure: LAPAROSCOPIC CHOLECYSTECTOMY WITH INTRAOPERATIVE CHOLANGIOGRAM;  Surgeon: Georganna Skeans, MD;  Location: [place];  Service: General;  Laterality: N/A;  . ERCP N/A 03/11/2015   Procedure: ENDOSCOPIC RETROGRADE CHOLANGIOPANCREATOGRAPHY (ERCP);  Surgeon: Milus Banister, MD;  Location: [place];  Service: Endoscopy;  Laterality: N/A;  . ESOPHAGOGASTRODUODENOSCOPY (EGD) WITH PROPOFOL N/A 07/25/2018   Procedure: ESOPHAGOGASTRODUODENOSCOPY (EGD) WITH PROPOFOL;  Surgeon: Rush Landmark Telford Nab., MD;  [facility];  Service: Gastroenterology;  Laterality: N/A;  . Transduodenal Ampullectomy  09/2015  . TUBAL LIGATION      Family History  Problem Relation Age of Onset  . Diabetes Mother   . Heart disease Mother   . Hypertension Mother   . Diabetes Father   . Bone cancer Maternal Grandfather   . Kidney disease Maternal Uncle   . Other Son        had kidney removed due to gun shot wound    Social History   Socioeconomic History  . Marital status: Divorced    Spouse name: Not on file  . Number of  children: 2  . Years of education: Not on file  . Highest education level: Not on file  Occupational History  . Occupation: Development worker, community  Social Needs  . Financial resource strain: Not on file  . Food insecurity    Worry: Not on file    Inability: Not on file  . Transportation needs    Medical: Not on file    Non-medical: Not on file  Tobacco Use  . Smoking status: Current Every Day Smoker    Packs/day: 1.50    Years: 4.00    Pack years: 6.00    Types: Cigarettes  . Smokeless tobacco: Never Used  Substance and Sexual Activity  . Alcohol use: Yes    Comment: rare  . Drug use: No  . Sexual activity: Not on file  Lifestyle  . Physical activity    Days per week: Not on file    Minutes per session: Not on file  . Stress: Not on file  Relationships  . Social Herbalist on phone: Not on file    Gets together: Not on file    Attends religious service: Not on file    Active member of club or organization: Not on file    Attends meetings of clubs or organizations: Not on file    Relationship status: Not on file  . Intimate partner violence    Fear of current or ex partner: Not on file    Emotionally abused: Not on  file    Physically abused: Not on file    Forced sexual activity: Not on file  Other Topics Concern  . Not on file  Social History Narrative   Patient has 2 sons one is 75, the other 61 as of 03/2015. Her kids are not the children of her current husband. As of 03/2015 she is not employed outside the home.    Outpatient Medications Prior to Visit  Medication Sig Dispense Refill  . albuterol (VENTOLIN HFA) 108 (90 Base) MCG/ACT inhaler Inhale 2 puffs into the lungs every 6 (six) hours as needed for wheezing or shortness of breath. 1 Inhaler 1  . atorvastatin (LIPITOR) 40 MG tablet Take 1 tablet (40 mg total) by mouth at bedtime. 30 tablet 3  . Blood Glucose Monitoring Suppl (TRUE METRIX METER) w/Device KIT Use as directed 1 kit 0  . gabapentin (NEURONTIN)  300 MG capsule Take 1 capsule (300 mg total) by mouth 3 (three) times daily. 90 capsule 6  . glipiZIDE (GLUCOTROL) 5 MG tablet Take 1 tablet (5 mg total) by mouth daily before breakfast. 30 tablet 3  . glucose blood (TRUE METRIX BLOOD GLUCOSE TEST) test strip Use as instructed 100 each 12  . Insulin Glargine (BASAGLAR KWIKPEN) 100 UNIT/ML SOPN Inject 0.5 mLs (50 Units total) into the skin at bedtime. 15 mL 2  . Insulin Pen Needle 31G X 5 MM MISC Inject 10 units subcutaneous at bedtime 100 each 0  . losartan (COZAAR) 25 MG tablet Take 1 tablet (25 mg total) by mouth daily. 30 tablet 3  . metFORMIN (GLUCOPHAGE-XR) 500 MG 24 hr tablet Take 2 tablets (1,000 mg total) by mouth 2 (two) times daily. 60 tablet 3  . TRUEPLUS LANCETS 28G MISC Use as directed 100 each 12  . meloxicam (MOBIC) 7.5 MG tablet Take 1 tablet (7.5 mg total) by mouth daily. 14 tablet 0   Facility-Administered Medications Prior to Visit  Medication Dose Route Frequency Provider Last Rate Last Dose  . 0.9 %  sodium chloride infusion  500 mL Intravenous Once Nandigam, Kavitha V, MD        No Known Allergies  ROS Review of Systems  Gastrointestinal: Positive for abdominal distention and abdominal pain.  All other systems reviewed and are negative.     Objective:    Physical Exam  Constitutional: She is oriented to person, place, and time. She appears well-developed and well-nourished.  obesed  HENT:  Head: Normocephalic.  Eyes: Pupils are equal, round, and reactive to light. EOM are normal.  Neck: Normal range of motion. Neck supple.  Cardiovascular: Normal rate and regular rhythm.  Pulmonary/Chest: Effort normal and breath sounds normal.  Abdominal: Soft. She exhibits distension and mass.  Hernia umbilical  Musculoskeletal: Normal range of motion.  Neurological: She is alert and oriented to person, place, and time.  Skin: Skin is warm and dry.    BP 131/85 (BP Location: Right Arm, Patient Position: Sitting, Cuff  Size: Normal)   Pulse 63   Temp (!) 97.3 F (36.3 C) (Tympanic)   Ht 5' 5"  (1.651 m)   Wt 252 lb 12.8 oz (114.7 kg)   LMP 03/19/2019 (Approximate)   SpO2 95%   BMI 42.07 kg/m  Wt Readings from Last 3 Encounters:  03/29/19 254 lb 9.6 oz (115.5 kg)  03/26/19 252 lb 12.8 oz (114.7 kg)  03/12/19 247 lb 3.2 oz (112.1 kg)     Health Maintenance Due  Topic Date Due  . OPHTHALMOLOGY EXAM  12/01/1984  . FOOT EXAM  08/25/2017  . PAP SMEAR-Modifier  07/22/2018    There are no preventive care reminders to display for this patient.  Lab Results  Component Value Date   TSH 3.090 03/22/2018   Lab Results  Component Value Date   WBC 8.6 03/26/2019   HGB 13.1 03/26/2019   HCT 42.4 03/26/2019   MCV 81 03/26/2019   PLT 289 03/26/2019   Lab Results  Component Value Date   NA 141 03/26/2019   K 4.2 03/26/2019   CO2 23 03/26/2019   GLUCOSE 65 03/26/2019   BUN 12 03/26/2019   CREATININE 0.68 03/26/2019   BILITOT <0.2 03/26/2019   ALKPHOS 87 03/26/2019   AST 31 03/26/2019   ALT 43 (H) 03/26/2019   PROT 7.1 03/26/2019   ALBUMIN 4.1 03/26/2019   CALCIUM 9.1 03/26/2019   ANIONGAP 9 05/14/2015   Lab Results  Component Value Date   CHOL 141 03/26/2019   Lab Results  Component Value Date   HDL 37 (L) 03/26/2019   Lab Results  Component Value Date   LDLCALC 61 12/04/2018   Lab Results  Component Value Date   TRIG 160 (H) 03/26/2019   Lab Results  Component Value Date   CHOLHDL 3.8 03/26/2019   Lab Results  Component Value Date   HGBA1C 7.9 (A) 03/26/2019      Assessment & Plan:  Inge was seen today for follow-up and gastroesophageal reflux.  Diagnoses and all orders for this visit:  Uncontrolled type 2 diabetes mellitus with hyperglycemia, with long-term current use of insulin ([place]) ADA recommends the following therapeutic goals for glycemic control related to A1c measurements: Goal of therapy: Less than 6.5 hemoglobin A1c.  Reference clinical practice  recommendations. Foods that are high in carbohydrates are the following rice, potatoes, breads, sugars, and pastas.  Reduction in the intake (eating) will assist in lowering your blood sugars. -     HgB A1c -     Glucose (CBG) -     CBC with Differential -     Comprehensive metabolic panel  Encounter for diabetic foot exam ([place]) Sensory exam of the foot is normal, tested with the monofilament. Good pulses, no lesions or ulcers, good peripheral pulses.  Mixed hyperlipidemia Reduce foods high in cholesterol or liver, fatty meats,cheese, butter avocados, nuts and seeds, chocolate and fried foods.  -     Lipid Panel  Need for immunization against influenza -     Flu Vaccine QUAD 36+ mos IM CDC recommends influenza vaccine yearly.  Flu intercanthus respiratory illness caused by influenza virus that affects the nose throat and sometimes the lungs.  Experts believe that liver spread managed by tract was made mainly by tiny droplets made when people with the flu cough sneeze or talk.  Tobacco dependence Nicotine affect every organ in the body second leading cause of death.  Increased risk for lung cancer and other respiratory diseases recommend cessation.  This will be reminded at each clinical visit.  Onychomycosis Onychomycosis is a fungal infection of the nail can cause disfigurement of the nail, pain and may increase risk for soft tissue bacteria infection.  Dermatophytes, non-dermatophyte molds, and candidemia albicans are causes of Onychomycosis. Treatments are antifungal  vary from topical to oral .  Recommended for diabetic patients to be followed by podiatry. -     Ambulatory referral to Podiatry  Gastroesophageal reflux disease without esophagitis Discussed eating small frequent meal, reduction in acidic foods, fried foods ,spicy foods,  alcohol caffeine and tobacco and certain medications. Avoid laying down after eating 50mns-1hour, elevated head of the bed.   Other orders -      omeprazole (PRILOSEC) 20 MG capsule; Take 1 capsule (20 mg total) by mouth daily.    Meds ordered this encounter  Medications  . omeprazole (PRILOSEC) 20 MG capsule    Sig: Take 1 capsule (20 mg total) by mouth daily.    Dispense:  30 capsule    Refill:  3    Follow-up: Return in about 3 months (around 06/25/2019) for DM.    MKerin Perna NP

## 2019-03-26 NOTE — Patient Instructions (Addendum)
Diabetes mellitus y actividad fsica Diabetes Mellitus and Exercise Hacer actividad fsica habitualmente es importante para el estado de salud general, en especial si tiene diabetes (diabetes mellitus). La actividad fsica no solo se reduce a bajar de peso. Aporta muchos beneficios para la salud, como aumento de la fuerza muscular y la densidad sea, y reduccin de las grasas corporales y el estrs. Esto mejora el estado fsico, la flexibilidad y la resistencia, y todo ello redunda en un mejor estado de salud general. La actividad fsica tiene beneficios adicionales para los diabticos, entre ellos:  Disminuye el apetito.  Ayuda a bajar y mantener la glucemia bajo control.  Baja la presin arterial.  Ayuda a controlar las cantidades de sustancias grasas (lpidos) en la sangre, como el colesterol y los triglicridos.  Mejora la respuesta del cuerpo a la insulina (optimizacin de la sensibilidad a la insulina).  Reduce la cantidad de insulina que el cuerpo necesita.  Reduce el riesgo de sufrir cardiopata coronaria de la siguiente forma: ? Baja los niveles de colesterol y triglicridos. ? Aumenta los niveles de colesterol bueno. ? Disminuye la glucemia. Cul es mi plan de actividad? El mdico o un educador para la diabetes certificado pueden ayudarlo a elaborar un plan respecto del tipo y de la frecuencia de actividad fsica (plan de actividades) adecuado para usted. Asegrese de lo siguiente:  Haga por lo menos 150minutos semanales de ejercicios de intensidad moderada o vigorosa. Estos podran ser caminatas dinmicas, ciclismo o gimnasia acutica. ? Haga ejercicios de elongacin y de fortalecimiento, como yoga o levantamiento de pesas, por lo menos 2veces por semana. ? Reparta la actividad en al menos 3das de la semana.  Haga algn tipo de actividad fsica todos los das. ? No deje pasar ms de 2das seguidos sin hacer algn tipo de actividad fsica. ? Evite permanecer inactivo  durante ms de 30minutos seguidos. Tmese descansos frecuentes para caminar o estirarse.  Elija un tipo de ejercicio o de actividad que disfrute y establezca objetivos realistas.  Comience lentamente y aumente de manera gradual la intensidad del ejercicio con el correr del tiempo. Qu debo saber acerca del control de la diabetes?   Contrlese la glucemia antes y despus de ejercitarse. ? Si la glucemia es de 240mg/dl (13,3mmol/l) o ms antes de comenzar a hacer actividad fsica, controle la orina para detectar la presencia de cetonas. Si tiene cetonas en la orina, no haga ejercicio hasta que la glucemia se normalice. ? Si la glucemia es de 100 mg/dl (5.6 mmol/l) o menos, tome una colacin que contenga entre 15 y 20 gramos de carbohidratos. Controle la glucemia 15 minutos despus de la colacin para asegurarse de que el nivel est por encima de 100 mg/dl (5.6 mmol/l) antes de comenzar a hacer actividad fsica.  Conozca los sntomas de la glucemia baja (hipoglucemia) y aprenda cmo tratarla. El riesgo de tener hipoglucemia aumenta durante y despus de hacer actividad fsica. Los sntomas frecuentes de hipoglucemia pueden incluir los siguientes: ? Hambre. ? Ansiedad. ? Sudoracin y piel hmeda. ? Confusin. ? Mareos o sensacin de desvanecimiento. ? Aumento de la frecuencia cardaca o palpitaciones. ? Visin borrosa. ? Hormigueo o adormecimiento alrededor de la boca, los labios o la lengua. ? Estremecimientos y temblores. ? Irritabilidad.  Tenga una colacin de carbohidratos de accin rpida disponible antes, durante y despus de ejercitarse, a fin de evitar o tratar la hipoglucemia.  Evite inyectarse insulina en las zonas del cuerpo que ejercitar. Por ejemplo, evite inyectarse insulina en: ? Los brazos,   si juega al tenis. ? Las piernas, si corre.  Lleve registros de sus hbitos de actividad fsica. Esto puede ayudarlos a usted y al mdico a Tax adviser de control de la diabetes  segn sea necesario. Escriba los siguientes datos: ? Los alimentos que consume antes y despus de Field seismologist actividad fsica. ? Los niveles de glucosa en la sangre antes y despus de hacer [place]. ? El tipo y cantidad de [place] fsica que Musician. ? Cuando se prev que la insulina alcance su valor mximo, si [place] insulina. No haga actividad fsica en los momentos en que insulina alcanza su valor mximo.  Cuando comience un ejercicio o una actividad nuevos, trabaje con el mdico para asegurarse de que la actividad sea segura para usted y para Secretary/administrator la [place], los medicamentos o la ingesta de alimentos segn sea necesario.  Beba gran cantidad de agua mientras hace ejercicio para evitar la deshidratacin o los golpes de Freight forwarder. Beba suficiente lquido como para mantener la orina clara o de color amarillo plido. Resumen  Hacer actividad fsica habitualmente es importante para el estado de salud general, en especial si tiene diabetes (diabetes mellitus).  La actividad fsica aporta muchos beneficios para la salud, como aumentar la fuerza muscular y la densidad sea, y reducir las grasas corporales y Dealer.  El mdico o un educador para la diabetes certificado pueden ayudarlo a Engineer, petroleum del tipo y de la frecuencia de actividad fsica (plan de actividades) adecuado para usted.  Cuando comience un ejercicio o una actividad nuevos, trabaje con el mdico para asegurarse de que la actividad sea segura para usted y para Secretary/administrator la [place], los medicamentos o la ingesta de alimentos segn sea necesario. Esta informacin no tiene Marine scientist el consejo del mdico. Asegrese de hacerle al mdico cualquier pregunta que tenga. Document Released: 07/10/2007 Document Revised: 04/17/2017 Document Reviewed: 11/30/2015 Elsevier Patient Education  [place] uterinos Uterine Fibroids  Los fibromas uterinos son bultos de tejido (tumores) en el vientre (tero).  No son cancerosos (son benignos). Lastrup mujeres con esta afeccin no Producer, television/film/video. A veces, los fibromas pueden afectar la capacidad de tener hijos (la fertilidad). Si esto ocurre, es posible que necesite una ciruga para que le extirpen los fibromas. Siga estas indicaciones en su casa:  Tome los medicamentos de venta libre y los recetados solamente como se lo haya indicado el mdico. Su mdico puede sugerir antiinflamatorios no esteroideos (AINE), como aspirina o ibuprofeno, para Glass blower/designer.  Pregntele al mdico si debe hacer lo siguiente: ? Tomar comprimidos de hierro. ? Comer ms alimentos que contengan hierro, como las verduras de hojas de color verde oscuro.  Si se lo indican, aplique calor en la espalda o el vientre para Best boy. Use la fuente de calor que el mdico le recomiende, como una compresa de calor hmedo o una almohadilla trmica. ? Ponga una toalla entre la piel y la fuente de Freight forwarder. ? Aplique el calor durante 20 a 38minutos. ? Retire la fuente de calor si la piel se pone de color rojo brillante. Esto es muy importante si no puede Education officer, environmental, calor o fro. Puede correr un riesgo mayor de sufrir quemaduras.  Preste mucha atencin a su perodo (ciclo menstrual). Informe al mdico si hay algn cambio, por ejemplo: ? Un flujo de sangre ms abundante de lo normal. ? Necesidad de usar ms apsitos o tampones que lo normal. ? Un cambio en la  cantidad de [employer] dura la [place]. ? Un cambio en los sntomas que aparecen con la menstruacin, como dolor de espalda o clicos.  Concurra a todas las visitas de seguimiento como se lo haya indicado el mdico. Esto es importante. Es posible que el mdico deba controlar sus fibromas con el tiempo para ver si hay cambios. Comunquese con un mdico si usted:  Siente un dolor que no mejora con los medicamentos o al Presenter, broadcasting, tales como dolor o calambres en: ? La espalda. ? La zona que se  encuentra [employer] de las caderas (zona plvica). ? El vientre.  Tiene nuevo sangrado [employer].  Observa ms sangrado durante o entre perodos [place].  Se siente muy cansada o dbil.  Tiene sensacin de desvanecimiento. Solicite ayuda inmediatamente si:  Pierde el conocimiento (se desmaya).  Tiene dolor en la zona [employer] huesos de la cadera que empeora sbitamente.  Tiene un sangrado que empapa un tampn o un apsito en 30 minutos o menos. Resumen  Los fibromas uterinos son bultos de tejido (tumores) en el vientre (tero). No son formaciones cancerosas.  El nico tratamiento que necesita la [place] de las mujeres es tomar aspirina o ibuprofeno para Conservation officer, historic buildings.  Comunquese con un mdico si tiene dolor o clicos que no mejoran con los medicamentos.  Asegrese de saber cules son los sntomas por los que debe obtener ayuda de inmediato. Esta informacin no tiene Marine scientist el consejo del mdico. Asegrese de hacerle al mdico cualquier pregunta que tenga. Document Released: 10/05/2010 Document Revised: 09/01/2017 Document Reviewed: 09/01/2017 Elsevier Patient Education  2020 [employer].

## 2019-03-27 ENCOUNTER — Ambulatory Visit (INDEPENDENT_AMBULATORY_CARE_PROVIDER_SITE_OTHER): Payer: Medicaid Other | Admitting: Primary Care

## 2019-03-27 LAB — COMPREHENSIVE METABOLIC PANEL
ALT: 43 IU/L — ABNORMAL HIGH (ref 0–32)
AST: 31 IU/L (ref 0–40)
Albumin/Globulin Ratio: 1.4 (ref 1.2–2.2)
Albumin: 4.1 g/dL (ref 3.8–4.8)
Alkaline Phosphatase: 87 IU/L (ref 39–117)
BUN/Creatinine Ratio: 18 (ref 9–23)
BUN: 12 mg/dL (ref 6–24)
Bilirubin Total: <0.2 mg/dL (ref 0.0–1.2)
CO2: 23 mmol/L (ref 20–29)
Calcium: 9.1 mg/dL (ref 8.7–10.2)
Chloride: 101 mmol/L (ref 96–106)
Creatinine, Ser: 0.68 mg/dL (ref 0.57–1.00)
GFR calc Af Amer: 123 mL/min/{1.73_m2} (ref >59)
GFR calc non Af Amer: 107 mL/min/{1.73_m2} (ref >59)
Globulin, Total: 3 g/dL (ref 1.5–4.5)
Glucose: 65 mg/dL (ref 65–99)
Potassium: 4.2 mmol/L (ref 3.5–5.2)
Sodium: 141 mmol/L (ref 134–144)
Total Protein: 7.1 g/dL (ref 6.0–8.5)

## 2019-03-27 LAB — CBC WITH DIFFERENTIAL/PLATELET
Basophils Absolute: 0 10*3/uL (ref 0.0–0.2)
Basos: 0 %
EOS (ABSOLUTE): 0.4 10*3/uL (ref 0.0–0.4)
Eos: 5 %
Hematocrit: 42.4 % (ref 34.0–46.6)
Hemoglobin: 13.1 g/dL (ref 11.1–15.9)
Immature Grans (Abs): 0 10*3/uL (ref 0.0–0.1)
Immature Granulocytes: 0 %
Lymphocytes Absolute: 3.7 10*3/uL — ABNORMAL HIGH (ref 0.7–3.1)
Lymphs: 44 %
MCH: 25 pg — ABNORMAL LOW (ref 26.6–33.0)
MCHC: 30.9 g/dL — ABNORMAL LOW (ref 31.5–35.7)
MCV: 81 fL (ref 79–97)
Monocytes Absolute: 0.6 10*3/uL (ref 0.1–0.9)
Monocytes: 7 %
Neutrophils Absolute: 3.8 10*3/uL (ref 1.4–7.0)
Neutrophils: 44 %
Platelets: 289 10*3/uL (ref 150–450)
RBC: 5.24 x10E6/uL (ref 3.77–5.28)
RDW: 15.8 % — ABNORMAL HIGH (ref 11.7–15.4)
WBC: 8.6 10*3/uL (ref 3.4–10.8)

## 2019-03-27 LAB — LIPID PANEL
Chol/HDL Ratio: 3.8 ratio (ref 0.0–4.4)
Cholesterol, Total: 141 mg/dL (ref 100–199)
HDL: 37 mg/dL — ABNORMAL LOW (ref >39)
LDL Chol Calc (NIH): 76 mg/dL (ref 0–99)
Triglycerides: 160 mg/dL — ABNORMAL HIGH (ref 0–149)
VLDL Cholesterol Cal: 28 mg/dL (ref 5–40)

## 2019-03-29 ENCOUNTER — Ambulatory Visit (INDEPENDENT_AMBULATORY_CARE_PROVIDER_SITE_OTHER): Payer: Self-pay | Admitting: Surgery

## 2019-03-29 ENCOUNTER — Telehealth (INDEPENDENT_AMBULATORY_CARE_PROVIDER_SITE_OTHER): Payer: Self-pay

## 2019-03-29 ENCOUNTER — Other Ambulatory Visit: Payer: Self-pay

## 2019-03-29 ENCOUNTER — Encounter: Payer: Self-pay | Admitting: Surgery

## 2019-03-29 VITALS — BP 158/92 | HR 88 | Temp 97.7°F | Resp 12 | Ht 65.0 in | Wt 254.6 lb

## 2019-03-29 DIAGNOSIS — K432 Incisional hernia without obstruction or gangrene: Secondary | ICD-10-CM

## 2019-03-29 NOTE — Patient Instructions (Addendum)
Our surgery scheduler will call you to schedule your surgery, please have the [facility] suregery sheet available when speaking with her.    Hernia ventral Ventral Hernia  Una hernia ventral es un bulto de tejido del interior del abdomen que empuja y sale a travs de una zona dbil de los msculos que forman la pared abdominal frontal. Los tejidos dentro del abdomen se encuentran en un saco (peritoneo). Estos tejidos incluyen el intestino delgado, el intestino grueso y el tejido graso que recubre el intestino (epipln). Algunas veces, el bulto que forma una hernia contiene intestinos. Otras hernias contienen solo grasa. Las hernias ventrales no desaparecen sin tratamiento quirrgico. Hay varios tipos de hernias ventrales. Es posible que tenga lo siguiente:  Una hernia en el lugar de una incisin de Ardelia Mems ciruga abdominal previa (hernia incisional).  Una hernia justo por encima del ombligo (hernia epigstrica), o en el ombligo (hernia umbilical). Estos tipos de hernias pueden desarrollarse por levantar objetos pesados o hacer mucha fuerza.  Una hernia que aparece y desaparece (hernia reducible). Puede ser visible solamente cuando usted levanta algo o hace fuerza. Este tipo de hernia se puede reintroducir en el abdomen (reducir).  Una hernia que aprisiona tejido abdominal (hernia encarcelada). Este tipo de hernia es irreducible.  Una hernia que interrumpe el flujo de sangre a los tejidos en su interior (hernia estrangulada). Si esto ocurre, los tejidos Radio broadcast assistant a Pharmacologist. Se trata de un bulto muy doloroso e irreducible. Una hernia estrangulada es una emergencia mdica. Cules son las causas? Esta afeccin sucede cuando el tejido abdominal presiona una zona dbil de los msculos abdominales. Qu incrementa el riesgo? Los siguientes factores pueden hacer que usted sea ms propenso a Public librarian afeccin:  Ser hombre.  Tener 60 aos o ms.  Tener exceso de [place] u obesidad.  Haberse sometido a  ciruga abdominal en el pasado, especialmente si se produjo una infeccin luego de la [place].  Haber sufrido una lesin en la pared abdominal.  [place] tenido varios embarazos.  Tener una acumulacin de lquido dentro del abdomen (ascitis). Cules son los signos o sntomas? El nico sntoma de una hernia ventral puede ser un bulto indoloro en el abdomen. Una hernia reducible puede ser visible solo al [employer], toser o Surveyor, quantity. Otros sntomas pueden incluir lo siguiente:  Dolor sordo.  Sensacin de opresin. Los signos y sntomas de una hernia estrangulada pueden incluir los siguientes:  Aumento del Social research officer, government.  Nuseas y vmitos.  Dolor al ejercer presin sobre la hernia.  La piel que recubre la hernia se torna roja o violcea.  Estreimiento.  Sangre en la materia fecal (heces). Cmo se diagnostica? Esta afeccin se puede diagnosticar en funcin de lo siguiente:  Sus sntomas.  Sus antecedentes mdicos.  Un examen fsico. Pueden pedirle que tosa o que haga fuerza mientras est de pie. Estas acciones aumentan la presin dentro del abdomen y empujan la hernia a travs de la abertura en los msculos. El mdico puede ejercer presin sobre la hernia para tratar de reducirla.  Estudios de diagnstico por imgenes, como una ecografa o una exploracin por tomografa computarizada (TC). Cmo se trata? Esta afeccin se trata con ciruga. Si la hernia est estrangulada, la ciruga se realiza lo antes posible. Si la hernia es pequea y no est encarcelada, es posible que se le pida que pierda algo de peso antes de la [place]. Siga estas indicaciones en su casa:  Siga las indicaciones del mdico respecto de las restricciones de comidas o  bebidas.  Si tiene sobrepeso, el mdico puede recomendarle que aumente su nivel de [place] y que coma una dieta saludable.  No levante ningn objeto que pese ms de 10libras (4.5kg).  Reanude sus actividades normales segn lo indicado  por el mdico. Pregntele al mdico qu actividades son seguras para usted. Tal vez deba evitar las actividades que aumentan la presin sobre la hernia.  Tome los medicamentos de venta libre y los recetados solamente como se lo haya indicado el mdico.  Consulting civil engineer a todas las visitas de control como se lo haya indicado el mdico. Esto es importante. Comunquese con un mdico si:  La hernia se agranda.  La hernia le causa dolor. Solicite ayuda de inmediato si:  La hernia duele cada vez ms.  Tiene dolor junto con cualquiera de los siguientes sntomas: ? Cambios en el color de la piel en la zona de la hernia. ? Nuseas. ? Vmitos. ? Fiebre. Resumen  Una hernia ventral es un bulto de tejido del interior del abdomen que empuja y sale a travs de una zona dbil de los msculos que forman la pared abdominal frontal.  Esta afeccin se trata con ciruga, que puede ser de urgencia segn la hernia.  No levante nada que pese ms de 10 libras (4.5 kg), y siga las indicaciones del mdico sobre la [place]. Esta informacin no tiene Marine scientist el consejo del mdico. Asegrese de hacerle al mdico cualquier pregunta que tenga. Document Released: 10/15/2012 Document Revised: 10/02/2017 Document Reviewed: 04/21/2017 Elsevier Patient Education  2020 [employer].

## 2019-03-29 NOTE — Telephone Encounter (Signed)
Call placed using pacific interpreter [phone]) patient aware that A1c has vastly improved from 10.2 to 7.9 no changes to medications.Triglycerides are improved, continue taking atorvastatin every night and decreasing fried/fatty foods, cheese, eggs and butter. Liver enzymes slightly elevated, not to be alarmed, we will monitor. Congratulated patient on the good work and encouraged her to keep it up. Patient expressed understanding of all results. Nat Christen, CMA

## 2019-03-29 NOTE — Telephone Encounter (Signed)
-----   Message from Kerin Perna, NP sent at 03/29/2019 12:34 PM EDT ----- Improvement in TG continue to take atorvastin at night and decrease fatty foods fried foods, cheese, eggs , butter.  ALT liver enzyme is slightly elevated not to be alarm but will monitor . A1C vast improvement from 10.2 to 7.9 no changes on medication keep up the good work

## 2019-03-29 NOTE — Progress Notes (Signed)
03/29/2019  History of Present Illness: Julie Robertson is a 43 y.o. female presenting for follow up of an umbilical hernia. 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  This was via an upper midline incision.  She was seen on 9/8 on initial visit and CT scan was ordered to better evaluate her hernia, given her body habitus.  CT scan was done on 9/18 which actually showed multiple upper midline ventral hernias.  I have independently viewed the images.  She reports today that she will still have the same discomfort over the umbilical area, but has not worsened, and the bulging has not gotten bigger.  She has been working on glucose control and weight loss.  Her A1C was drastically improved from 10.2 in June to 7.9 just two days ago.    Past Medical History: Past Medical History:  Diagnosis Date  . Asthma   . Diabetes mellitus without complication ([place])   . 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  numbness tingling in right fingers. rx with Gabapentin.  Marland Kitchen Dyslipidemia 08/2009  . Gall stones 08/2009  . Obesity    BMI 41, 250# 03/2015     Past Surgical History: Past Surgical History:  Procedure Laterality Date  . CHOLECYSTECTOMY N/A 03/10/2015   Procedure: LAPAROSCOPIC CHOLECYSTECTOMY WITH INTRAOPERATIVE CHOLANGIOGRAM;  Surgeon: Georganna Skeans, MD;  Location: [facility];  Service: General;  Laterality: N/A;  . ERCP N/A 03/11/2015   Procedure: ENDOSCOPIC RETROGRADE CHOLANGIOPANCREATOGRAPHY (ERCP);  Surgeon: Milus Banister, MD;  Location: [facility];  Service: Endoscopy;  Laterality: N/A;  . ESOPHAGOGASTRODUODENOSCOPY (EGD) WITH PROPOFOL N/A 07/25/2018   Procedure: ESOPHAGOGASTRODUODENOSCOPY (EGD) WITH PROPOFOL;  Surgeon: Rush Landmark Telford Nab., MD;  Location: [facility];  Service: Gastroenterology;  Laterality: N/A;  . Transduodenal Ampullectomy  09/2015  . TUBAL LIGATION      Home Medications: Prior to Admission medications    Medication Sig Start Date End Date Taking? Authorizing Provider  albuterol (VENTOLIN HFA) 108 (90 Base) MCG/ACT inhaler Inhale 2 puffs into the lungs every 6 (six) hours as needed for wheezing or shortness of breath. 12/04/18  Yes Kerin Perna, NP  atorvastatin (LIPITOR) 40 MG tablet Take 1 tablet (40 mg total) by mouth at bedtime. 02/11/19  Yes Kerin Perna, NP  Blood Glucose Monitoring Suppl (TRUE METRIX METER) w/Device KIT Use as directed 04/12/17  Yes Hairston, Mandesia R, FNP  gabapentin (NEURONTIN) 300 MG capsule Take 1 capsule (300 mg total) by mouth 3 (three) times daily. 02/11/19  Yes Kerin Perna, NP  glipiZIDE (GLUCOTROL) 5 MG tablet Take 1 tablet (5 mg total) by mouth daily before breakfast. 02/11/19  Yes Kerin Perna, NP  glucose blood (TRUE METRIX BLOOD GLUCOSE TEST) test strip Use as instructed 03/22/18  Yes Fulp, Cammie, MD  Insulin Glargine (BASAGLAR KWIKPEN) 100 UNIT/ML SOPN Inject 0.5 mLs (50 Units total) into the skin at bedtime. 02/12/19  Yes Charlott Rakes, MD  Insulin Pen Needle 31G X 5 MM MISC Inject 10 units subcutaneous at bedtime 04/28/16  Yes Jegede, Olugbemiga E, MD  losartan (COZAAR) 25 MG tablet Take 1 tablet (25 mg total) by mouth daily. 02/11/19  Yes Kerin Perna, NP  metFORMIN (GLUCOPHAGE-XR) 500 MG 24 hr tablet Take 2 tablets (1,000 mg total) by mouth 2 (two) times daily. 02/11/19  Yes Kerin Perna, NP  omeprazole (PRILOSEC) 20 MG capsule Take 1 capsule (20 mg total) by mouth daily. 03/26/19  Yes Edwards,  Milford Cage, NP  TRUEPLUS LANCETS 28G MISC Use as directed 03/22/18  Yes Fulp, Cammie, MD    Allergies: No Known Allergies  Review of Systems: Review of Systems  Constitutional: Negative for chills and fever.  Respiratory: Negative for shortness of breath.   Cardiovascular: Negative for chest pain.  Gastrointestinal: Positive for abdominal pain (discomfort at umbilical portion of her hernias). Negative for nausea and  vomiting.    Physical Exam BP (!) 158/92   Pulse 88   Temp 97.7 F (36.5 C) (Temporal)   Resp 12   Ht _0  (1.651 m)   Wt 254 lb 9.6 oz (115.5 kg)   LMP 03/19/2019 (Approximate)   SpO2 98%   BMI 42.37 kg/m  CONSTITUTIONAL: No acute distress HEENT:  Normocephalic, atraumatic, extraocular motion intact. RESPIRATORY:  Lungs are clear, and breath sounds are equal bilaterally. Normal respiratory effort without pathologic use of accessory muscles. CARDIOVASCULAR: Heart is regular without murmurs, gallops, or rubs. GI: The abdomen is soft, obese, non-distended, non-tender to palpation except for mild discomfort when pressing on the umbilical portion.  This is easily reducible. Superior to this is another hernia that can be palpated that is also reducible.  The rest are not able to palpate well due to body habitus. NEUROLOGIC:  Motor and sensation is grossly normal.  Cranial nerves are grossly intact. PSYCH:  Alert and oriented to person, place and time. Affect is normal.  Labs/Imaging: CT scan abdomen/pelvis 03/22/19: IMPRESSION: 1.  No acute findings in the abdomen/pelvis. 2. Multiple midline ventral hernias with the largest over the periumbilical region. These all contain only peritoneal fat and are new since the previous exam. 3. Subcentimeter left renal cortical hypodensity too small to characterize, but likely a cyst and unchanged. 4.  Stable uterine fibroid. 5.  Aortic Atherosclerosis (ICD10-I70.0).  Assessment and Plan: This is a 43 y.o. female with multiple incisional hernias along prior midline incision from surgery in 2017  Discussed with the patient the different hernias that she has.  Given the extent of the hernias, she would benefit more from an abdominal wall reconstruction.  I think given the size of the defects, would be reasonable to do via posterior rectus sheath release or Rives Stoppa hernia repair.  Discussed with her that we would also use mesh to reinforce the  repair.  Discussed with her the risks of bleeding, injury to surrounding structures, infection, need for post-operative drains, post-op resctrictions of no heavy lifting or pushing of no more than 10-15 lbs for 4-6 weeks, that she would stay in the hospital for a few days post-op to await return of bowel function and pain control.  She's willing to proceed.  Discussed with her the importance of continue to work on her glucose control as well as loosing weight, as these steps would only help her with better wound healing and less risk of complications.  She will work on her weight loss and glucose control.   Her daughter's 21st birthday is mid October, and patient wants to wait until after to have surgery.  I think this will help so she can continue losing weight and controlling her glucose.  Given the complexity of the repair, I discussed with her that I would want my partner, Dr. Dahlia Byes, to assist in the surgery.  We would tentatively schedule her surgery for November 5th.  She will come to office towards the end of October to check on her progress and update H&P.  Face-to-face time spent with the patient  and care providers was 25 minutes, with more than 50% of the time spent counseling, educating, and coordinating care of the patient.     Melvyn Neth, [facility]

## 2019-04-04 ENCOUNTER — Ambulatory Visit (INDEPENDENT_AMBULATORY_CARE_PROVIDER_SITE_OTHER): Payer: Self-pay

## 2019-04-04 ENCOUNTER — Encounter: Payer: Self-pay | Admitting: Orthopedic Surgery

## 2019-04-04 ENCOUNTER — Ambulatory Visit (INDEPENDENT_AMBULATORY_CARE_PROVIDER_SITE_OTHER): Payer: Self-pay | Admitting: Orthopedic Surgery

## 2019-04-04 VITALS — Ht 65.0 in | Wt 254.0 lb

## 2019-04-04 DIAGNOSIS — G8929 Other chronic pain: Secondary | ICD-10-CM

## 2019-04-04 DIAGNOSIS — M25561 Pain in right knee: Secondary | ICD-10-CM

## 2019-04-04 DIAGNOSIS — M25571 Pain in right ankle and joints of right foot: Secondary | ICD-10-CM

## 2019-04-07 ENCOUNTER — Encounter: Payer: Self-pay | Admitting: Orthopedic Surgery

## 2019-04-07 DIAGNOSIS — G8929 Other chronic pain: Secondary | ICD-10-CM

## 2019-04-07 DIAGNOSIS — M25561 Pain in right knee: Secondary | ICD-10-CM

## 2019-04-07 MED ORDER — METHYLPREDNISOLONE ACETATE 40 MG/ML IJ SUSP
40.0000 mg | INTRAMUSCULAR | Status: AC | PRN
  Administered 2019-04-07: 40 mg via INTRA_ARTICULAR

## 2019-04-07 MED ORDER — LIDOCAINE HCL 1 % IJ SOLN
5.0000 mL | INTRAMUSCULAR | Status: AC | PRN
  Administered 2019-04-07: 12:00:00 5 mL

## 2019-04-07 NOTE — Progress Notes (Signed)
Office Visit Note   Patient: Julie Robertson           Date of Birth: Aug 19, 1975           MRN: [id number] Visit Date: 04/04/2019              Requested by: Kerin Perna, NP [address] [place],  [place] [address] PCP: Kerin Perna, NP  Chief Complaint  Patient presents with  . Right Knee - Pain  . Right Ankle - Pain      HPI: Patient is a 43 year old woman who presents complaining of right knee medial joint line pain and swelling.  She also states she twisted her right ankle and is been having pain and swelling with ambulation.  She states she does have an ankle stabilizing orthosis but is not wearing it.  Patient has tried a neoprene sleeve for the right knee without improvement.  Patient does smoke.  Assessment & Plan: Visit Diagnoses:  1. Chronic pain of right knee   2. Chronic pain of right ankle     Plan: The importance of smoking cessation was discussed the right knee was injected recommended strengthening and aerobic exercises for her joints.  Recommended smoking cessation.  Follow-Up Instructions: Return if symptoms worsen or fail to improve.   Ortho Exam  Patient is alert, oriented, no adenopathy, well-dressed, normal affect, normal respiratory effort. Examination of the right knee collaterals and cruciates are stable there is no effusion she does have crepitation in the patellofemoral joint medial lateral joint lines are nontender to palpation.  There is no redness no cellulitis.  Examination of the right ankle she has a negative anterior drawer but is tender to palpation over the anterior talofibular ligament.  Imaging: No results found. No images are attached to the encounter.  Labs: Lab Results  Component Value Date   HGBA1C 7.9 (A) 03/26/2019   HGBA1C 10.2 (H) 12/04/2018   HGBA1C 10.9 (A) 03/22/2018   HGBA1C 10.9 03/22/2018   HGBA1C 10.9 (A) 03/22/2018   HGBA1C 10.9 (A) 03/22/2018   REPTSTATUS 04/16/2010 FINAL 04/15/2010   CULT  04/15/2010    Multiple bacterial morphotypes present, none predominant. Suggest appropriate recollection if clinically indicated.     Lab Results  Component Value Date   ALBUMIN 4.1 03/26/2019   ALBUMIN 3.8 12/04/2018   ALBUMIN 4.0 03/22/2018    No results found for: MG No results found for: VD25OH  No results found for: PREALBUMIN CBC EXTENDED Latest Ref Rng & Units 03/26/2019 12/04/2018 03/22/2018  WBC 3.4 - 10.8 x10E3/uL 8.6 7.2 6.5  RBC 3.77 - 5.28 x10E6/uL 5.24 4.93 5.47(H)  HGB 11.1 - 15.9 g/dL 13.1 10.1(L) 10.6(L)  HCT 34.0 - 46.6 % 42.4 34.0 37.7  PLT 150 - 450 x10E3/uL 289 379 397  NEUTROABS 1.4 - 7.0 x10E3/uL 3.8 3.9 3.1  LYMPHSABS 0.7 - 3.1 x10E3/uL 3.7(H) 2.3 2.6     Body mass index is 42.27 kg/m.  Orders:  Orders Placed This Encounter  Procedures  . XR Knee 1-2 Views Right  . XR Ankle 2 Views Right   No orders of the defined types were placed in this encounter.    Procedures: Large Joint Inj: R knee on 04/07/2019 11:56 AM Indications: pain and diagnostic evaluation Details: 22 G 1.5 in needle, anteromedial approach  Arthrogram: No  Medications: 5 mL lidocaine 1 %; 40 mg methylPREDNISolone acetate 40 MG/ML Outcome: tolerated well, no immediate complications Procedure, treatment alternatives, risks and benefits explained, specific risks  discussed. Consent was given by the patient. Immediately prior to procedure a time out was called to verify the correct patient, procedure, equipment, support staff and site/side marked as required. Patient was prepped and draped in the usual sterile fashion.      Clinical Data: No additional findings.  ROS:  All other systems negative, except as noted in the HPI. Review of Systems  Objective: Vital Signs: Ht 5\' 5"  (1.651 m)   Wt 254 lb (115.2 kg)   LMP 03/19/2019 (Approximate)   BMI 42.27 kg/m   Specialty Comments:  No specialty comments available.  PMFS History: Patient Active Problem List    Diagnosis Date Noted  . Uncontrolled type 2 diabetes mellitus with hyperglycemia, with long-term current use of insulin ([place]) 04/12/2017  . Essential hypertension 04/12/2017  . Morbid (severe) obesity due to excess calories ([place]) 10/18/2016  . 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 Iron deficiency anemia 09/02/2015  . Asthma 08/28/2015  . Type 2 diabetes mellitus ([place]) 07/31/2015  . Hyperlipidemia 12/04/2013  . Numbness and tingling in right hand 12/04/2013  . Smoking 12/04/2013  . Elevated BP 12/04/2013  . Pap smear, high-risk (screening, no prior abnormality) 02/13/2013  . Exposure to STD 02/13/2013   Past Medical History:  Diagnosis Date  . Asthma   . Diabetes mellitus without complication ([place])   . Diabetic neuropathy ([place]) 12/2013   vs carpal tunnell.  numbness tingling in right fingers. rx with Gabapentin.  Marland Kitchen Dyslipidemia 08/2009  . Gall stones 08/2009  . Obesity    BMI 41, 250# 03/2015    Family History  Problem Relation Age of Onset  . Diabetes Mother   . Heart disease Mother   . Hypertension Mother   . Diabetes Father   . Bone cancer Maternal Grandfather   . Kidney disease Maternal Uncle   . Other Son        had kidney removed due to gun shot wound    Past Surgical History:  Procedure Laterality Date  . CHOLECYSTECTOMY N/A 03/10/2015   Procedure: LAPAROSCOPIC CHOLECYSTECTOMY WITH INTRAOPERATIVE CHOLANGIOGRAM;  Surgeon: Georganna Skeans, MD;  Location: [facility];  Service: General;  Laterality: N/A;  . ERCP N/A 03/11/2015   Procedure: ENDOSCOPIC RETROGRADE CHOLANGIOPANCREATOGRAPHY (ERCP);  Surgeon: Milus Banister, MD;  Location: [facility];  Service: Endoscopy;  Laterality: N/A;  . ESOPHAGOGASTRODUODENOSCOPY (EGD) WITH PROPOFOL N/A 07/25/2018   Procedure: ESOPHAGOGASTRODUODENOSCOPY (EGD) WITH PROPOFOL;  Surgeon: Rush Landmark Telford Nab., MD;  [facility];  Service: Gastroenterology;  Laterality: N/A;  . Transduodenal Ampullectomy  09/2015  . TUBAL LIGATION     Social  History   Occupational History  . Occupation: house cleaning  Tobacco Use  . Smoking status: Current Every Day Smoker    Packs/day: 1.50    Years: 4.00    Pack years: 6.00    Types: Cigarettes  . Smokeless tobacco: Never Used  Substance and Sexual Activity  . Alcohol use: Yes    Comment: rare  . Drug use: No  . Sexual activity: Not on file

## 2019-04-08 ENCOUNTER — Telehealth: Payer: Self-pay

## 2019-04-08 ENCOUNTER — Ambulatory Visit (INDEPENDENT_AMBULATORY_CARE_PROVIDER_SITE_OTHER): Payer: Self-pay | Admitting: Podiatry

## 2019-04-08 ENCOUNTER — Telehealth: Payer: Self-pay | Admitting: Primary Care

## 2019-04-08 ENCOUNTER — Other Ambulatory Visit: Payer: Self-pay

## 2019-04-08 DIAGNOSIS — L600 Ingrowing nail: Secondary | ICD-10-CM

## 2019-04-08 DIAGNOSIS — B351 Tinea unguium: Secondary | ICD-10-CM

## 2019-04-08 MED ORDER — GENTAMICIN SULFATE 0.1 % EX CREA: 1.0000 "application " | TOPICAL_CREAM | Freq: Two times a day (BID) | CUTANEOUS | 1 refills | Status: DC

## 2019-04-08 MED ORDER — TERBINAFINE HCL 250 MG PO TABS: 250.0000 mg | ORAL_TABLET | Freq: Every day | ORAL | 0 refills | Status: DC

## 2019-04-08 NOTE — Telephone Encounter (Signed)
Pt was informed that she has CAFA

## 2019-04-08 NOTE — Telephone Encounter (Signed)
Medical clearance faxed to Juluis Mire at this time [phone].

## 2019-04-08 NOTE — Telephone Encounter (Signed)
Patient called wanting to speak with you. Please follow up.

## 2019-04-11 NOTE — Progress Notes (Signed)
   Subjective: Patient with PMHx of T2DM presents today for evaluation of intermittent pain to the medial border of the right hallux that began a few years ago. Patient is concerned for possible ingrown nail. Applying pressure to the nail increases the pain. She has been trimming the nail out which has helped temporarily in the past. Patient presents today for further treatment and evaluation.  Past Medical History:  Diagnosis Date  . Asthma   . Diabetes mellitus without complication ([place])   . Diabetic neuropathy ([place]) 12/2013   vs carpal tunnell.  numbness tingling in right fingers. rx with Gabapentin.  Marland Kitchen Dyslipidemia 08/2009  . Gall stones 08/2009  . Obesity    BMI 41, 250# 03/2015    Objective:  General: Well developed, nourished, in no acute distress, alert and oriented x3   Dermatology: Skin is warm, dry and supple bilateral. Medial border right hallux appears to be erythematous with evidence of an ingrowing nail. Pain on palpation noted to the border of the nail fold. Hyperkeratotic, discolored, thickened, onychodystrophy of the right great toenail also noted. The remaining nails appear unremarkable at this time. There are no open sores, lesions.  Vascular: Dorsalis Pedis artery and Posterior Tibial artery pedal pulses palpable. No lower extremity edema noted.   Neruologic: Grossly intact via light touch bilateral.  Musculoskeletal: Muscular strength within normal limits in all groups bilateral. Normal range of motion noted to all pedal and ankle joints.   Assesement: #1 Paronychia with ingrowing nail medial border right hallux #2 Pain in toe #3 Incurvated nail #4 Onychomycosis right great toenail   Plan of Care:  1. Patient evaluated.  2. Discussed treatment alternatives and plan of care. Explained nail avulsion procedure and post procedure course to patient. 3. Patient opted for permanent partial nail avulsion of the medial border right hallux.  4. Prior to procedure, local  anesthesia infiltration utilized using 3 ml of a 50:50 mixture of 2% plain lidocaine and 0.5% plain marcaine in a normal hallux block fashion and a betadine prep performed.  5. Partial permanent nail avulsion with chemical matrixectomy performed using [id number] applications of phenol followed by alcohol flush.  6. Light dressing applied. 7. Prescription for Gentamicin cream provided to patient to use daily with a bandage.  8. Prescription for Lamisil 250 mg #90 provided to patient.  9. Return to clinic in 3 weeks.  Edrick Kins, DPM [facility]  Dr. Edrick Kins, [address]                                        [place], [place] [address]                Office [phone]  Fax [phone]

## 2019-04-12 ENCOUNTER — Telehealth: Payer: Self-pay

## 2019-04-12 NOTE — Telephone Encounter (Signed)
Medical clearance received from [facility]- needs EKG, A1C 7.9.

## 2019-04-12 NOTE — Telephone Encounter (Signed)
Spoke with Juluis Mire NP [phone] Regarding medical Clearance -per Sharyn Lull EKG to be ordered and done in her office, A1C is 7.9. Once EKG is done she will update medical clearance and fax back to [place].   Spoke with interpreter to let patient know that Juluis Mire is requesting EKG an to be expecting a call from Juluis Mire to schedule this. Patient was provided phone number to Juluis Mire and was asked to call her office to schedule.

## 2019-04-15 ENCOUNTER — Ambulatory Visit (INDEPENDENT_AMBULATORY_CARE_PROVIDER_SITE_OTHER): Payer: Self-pay

## 2019-04-15 ENCOUNTER — Other Ambulatory Visit: Payer: Self-pay

## 2019-04-15 DIAGNOSIS — Z008 Encounter for other general examination: Secondary | ICD-10-CM

## 2019-04-15 DIAGNOSIS — Z01810 Encounter for preprocedural cardiovascular examination: Secondary | ICD-10-CM

## 2019-04-16 ENCOUNTER — Telehealth: Payer: Self-pay

## 2019-04-16 NOTE — Telephone Encounter (Signed)
Medical Clearance faxed to Juluis Mire to update clearance due to EKG. Re faxed

## 2019-04-16 NOTE — Telephone Encounter (Signed)
Left message asking for a return call on appointment date and time for EKG request on medical clearance form.   Also left message for patient to call office -through interpreter services.

## 2019-04-18 ENCOUNTER — Telehealth: Payer: Self-pay

## 2019-04-18 NOTE — Telephone Encounter (Signed)
Medical Clearance obtained from [facility] facter medium due to secondary type 2 diabetes. Normal Ekg . A1c improved-7.9 EKG in epic.

## 2019-04-19 ENCOUNTER — Telehealth: Payer: Self-pay | Admitting: Surgery

## 2019-04-19 NOTE — Telephone Encounter (Signed)
Please call patient via interpreter services-Spanish speaking-and go over surgery info below.   Surgery Date: 05/09/19 with Dr Piscoya/Dr Pabon-abdominal wall reconstruction with posterior rectus release and mesh placement-incisional ventral hernia repair with mesh.  Preadmission Testing Date: 05/06/19-office interview-Pt will need to arrive at the [facility] and will be escorted to preadmission testing office.  Covid Testing Date: 05/06/19 following appointment with Preadmission Testing - patient advised to go to the [facility] ([place])  [employer] Video sent via [employer] and [employer].  Make patient aware to call [phone], between 1-3:00pm the day before surgery, to find out what time to arrive.

## 2019-04-19 NOTE — Telephone Encounter (Signed)
Left message for patient to call the office back, may need to call language line on this patient

## 2019-04-29 NOTE — Telephone Encounter (Signed)
Via interpreter services patient notified of surgery and pre admit testing time and dates.

## 2019-05-03 ENCOUNTER — Ambulatory Visit: Payer: Self-pay | Admitting: Surgery

## 2019-05-06 ENCOUNTER — Other Ambulatory Visit: Payer: Self-pay

## 2019-05-06 ENCOUNTER — Encounter
Admission: RE | Admit: 2019-05-06 | Discharge: 2019-05-06 | Disposition: A | Discharge status: Home / Self Care | Payer: Medicaid Other | Source: Ambulatory Visit | Attending: Surgery | Admitting: Surgery

## 2019-05-06 DIAGNOSIS — Z01812 Encounter for preprocedural laboratory examination: Secondary | ICD-10-CM | POA: Insufficient documentation

## 2019-05-06 DIAGNOSIS — Z20828 Contact with and (suspected) exposure to other viral communicable diseases: Secondary | ICD-10-CM | POA: Insufficient documentation

## 2019-05-06 HISTORY — DX: Dyspnea, unspecified: R06.00

## 2019-05-06 HISTORY — DX: Gastro-esophageal reflux disease without esophagitis: K21.9

## 2019-05-06 HISTORY — DX: Anxiety disorder, unspecified: F41.9

## 2019-05-06 LAB — SARS CORONAVIRUS 2 (TAT 6-24 HRS): SARS Coronavirus 2: NEGATIVE

## 2019-05-06 NOTE — Patient Instructions (Signed)
INSTRUCTIONS FOR SURGERY     Your surgery is scheduled for:   Thursday, November 5TH     To find out your arrival time for the day of surgery,          please call [phone] between 1 pm and 3 pm on :   Wednesday, November 4TH     When you arrive for surgery, report to the [facility].       Do NOT stop on the first floor to register.    REMEMBER: Instructions that are not followed completely may result in serious medical risk,  up to and including death, or upon the discretion of your surgeon and anesthesiologist,            your surgery may need to be rescheduled.  __X__ 1. Do not eat food after midnight the night before your procedure.                    No gum, candy, lozenger, tic tacs, tums or hard candies.                  ABSOLUTELY NOTHING SOLID IN YOUR MOUTH AFTER MIDNIGHT                    You may drink unlimited clear liquids up to 2 hours before you are scheduled to arrive for surgery.                   Do not drink anything within those 2 hours unless you need to take medicine, then take the                   smallest amount you need.  Clear liquids include:  water, apple juice without pulp,                   any flavor Gatorade, Black coffee, black tea.  Sugar may be added but no dairy/ honey /lemon.                        Broth and jello is not considered a clear liquid.  __x__  2. On the morning of surgery, please brush your teeth with toothpaste and water. You may rinse with                  mouthwash if you wish but DO NOT SWALLOW TOOTHPASTE OR MOUTHWASH  __X___3. NO alcohol for 24 hours before or after surgery.  __x___ 4.  Do NOT smoke or use e-cigarettes for 24 HOURS PRIOR TO SURGERY.                      DO NOT Use any chewable tobacco products for at least 6 hours prior to surgery.  __x___ 5. If you start any new medication after this appointment and prior to surgery, please             Bring it with you on the day of surgery.  ___x__ 6. Notify your doctor if there is any change in your medical condition, such as fever,  infection, vomitting,                   Diarrhea or any open sores.  __x___ 7.  USE the CHG SOAP as instructed, the night before surgery and the day of surgery.                   Once you have washed with this soap, do NOT use any of the following: Powders, perfumes                    or lotions. Please do not wear make up, hairpins, clips or nail polish. You MAY wear deodorant.                   Men may shave their face and neck.  Women need to shave 48 hours prior to surgery.                   DO NOT wear ANY jewelry on the day of surgery. If there are rings that are too tight to                    remove easily, please address this prior to the surgery day. Piercings need to be removed.                                                                     NO METAL ON YOUR BODY.                    Do NOT bring any valuables.  If you came to Pre-Admit testing then you will not need license,                     insurance card or credit card.  If you will be staying overnight, please either leave your things in                     the car or have your family be responsible for these items.                     [facility] IS NOT RESPONSIBLE FOR BELONGINGS OR VALUABLES.  ___X__ 8. DO NOT wear contact lenses on surgery day.  You may not have dentures,                     Hearing aides, contacts or glasses in the operating room. These items can be                    Placed in the Recovery Room to receive immediately after surgery.  __x___ 9. IF YOU ARE SCHEDULED TO GO HOME ON THE SAME DAY, YOU MUST                   Have someone to drive you home and to stay with you  for the first 24 hours.                    Have an arrangement prior to arriving on surgery day.  ___x__ 10. Take the following medications on the morning of surgery with a  sip of water:                               1.  ALBUTEROL INHALER                     2.  PRILOSEC                     3.                     4.                     _____ 11.  Follow any instructions provided to you by your surgeon.                        Such as enema, clear liquid bowel prep  __X__  12. STOP  ASPIRIN AS OF:  TODAY                       THIS INCLUDES BC POWDERS / GOODIES POWDER  __x___ 13. STOP Anti-inflammatories as of: TODAY                      This includes IBUPROFEN / MOTRIN / ADVIL / ALEVE/ NAPROXYN                    YOU MAY TAKE TYLENOL ANY TIME PRIOR TO SURGERY.  ___X__ 14.  Stop supplements until after surgery.                     This includes:  VITAMINS                 You may continue taking Vitamin B12 / Vitamin D3 but do not take on the morning of surgery.  _____ 15. Bring your CPAP machine into preop with you on the morning of surgery.  ___X___16.  Stop Metformin 2 full days prior to surgery.  Stop on: November 2ND.                      TAKE 1/2 OF USUAL INSULIN DOSE ON THE EVENING PRIOR TO SURGERY.                     Do NOT take any diabetes medications on surgery day. NO GLIPIZIDE  __X____17.  Continue to take the following medications but do not take on the morning of surgery:                           GLIPIZIDE // METFORMIN  __X____18. If staying overnight, please have appropriate shoes to wear to be able to walk around the unit.                   Wear clean and comfortable clothing to the hospital.  [facility] NEW MEDICINE (FOR YOUR TOE) TO [place].

## 2019-05-07 ENCOUNTER — Other Ambulatory Visit: Payer: Self-pay

## 2019-05-07 ENCOUNTER — Ambulatory Visit (INDEPENDENT_AMBULATORY_CARE_PROVIDER_SITE_OTHER): Payer: Self-pay | Admitting: Surgery

## 2019-05-07 ENCOUNTER — Encounter: Payer: Self-pay | Admitting: Surgery

## 2019-05-07 VITALS — BP 126/78 | HR 76 | Temp 97.5°F | Resp 12 | Ht 65.0 in | Wt 255.6 lb

## 2019-05-07 DIAGNOSIS — K432 Incisional hernia without obstruction or gangrene: Secondary | ICD-10-CM

## 2019-05-07 NOTE — H&P (View-Only) (Signed)
05/07/2019  History of Present Illness: Julie Robertson is a 43 y.o. female presenting for follow-up of her incisional hernias.  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  She was seen on 9/25 after CT scan had been done showing an incisional hernia with multiple hernia defects and Swiss cheese type fashion under her upper midline incision.  Surgery was scheduled for 05/09/2019 she presents today for H&P update.  She denies any new or worsening issues and reports of feeling the palpable hernias in her upper abdominal wall.  Denies any episodes of obstruction or incarceration or strangulation.  Past Medical History: Past Medical History:  Diagnosis Date  . Anxiety   . Asthma   . Diabetes mellitus without complication ([place])   . Diabetic neuropathy ([place]) 12/2013   vs carpal tunnell.  numbness tingling in right fingers. rx with Gabapentin.  Marland Kitchen Dyslipidemia 08/2009  . Dyspnea   . Gall stones 08/2009  . GERD (gastroesophageal reflux disease)   . Obesity    BMI 41, 250# 03/2015     Past Surgical History: Past Surgical History:  Procedure Laterality Date  . CHOLECYSTECTOMY N/A 03/10/2015   Procedure: LAPAROSCOPIC CHOLECYSTECTOMY WITH INTRAOPERATIVE CHOLANGIOGRAM;  Surgeon: Georganna Skeans, MD;  Location: [facility];  Service: General;  Laterality: N/A;  . ERCP N/A 03/11/2015   Procedure: ENDOSCOPIC RETROGRADE CHOLANGIOPANCREATOGRAPHY (ERCP);  Surgeon: Milus Banister, MD;  Location: [facility];  Service: Endoscopy;  Laterality: N/A;  . ESOPHAGOGASTRODUODENOSCOPY (EGD) WITH PROPOFOL N/A 07/25/2018   Procedure: ESOPHAGOGASTRODUODENOSCOPY (EGD) WITH PROPOFOL;  Surgeon: Rush Landmark Telford Nab., MD;  [facility];  Service: Gastroenterology;  Laterality: N/A;  . Transduodenal Ampullectomy  09/2015  . TUBAL LIGATION      Home Medications: Prior to Admission medications   Medication Sig Start Date End Date Taking? Authorizing Provider  albuterol  (VENTOLIN HFA) 108 (90 Base) MCG/ACT inhaler Inhale 2 puffs into the lungs every 6 (six) hours as needed for wheezing or shortness of breath. 12/04/18  Yes Kerin Perna, NP  atorvastatin (LIPITOR) 40 MG tablet Take 1 tablet (40 mg total) by mouth at bedtime. 02/11/19  Yes Kerin Perna, NP  Blood Glucose Monitoring Suppl (TRUE METRIX METER) w/Device KIT Use as directed 04/12/17  Yes Hairston, Mandesia R, FNP  gabapentin (NEURONTIN) 300 MG capsule Take 1 capsule (300 mg total) by mouth 3 (three) times daily. 02/11/19  Yes Kerin Perna, NP  gentamicin cream (GARAMYCIN) 0.1 % Apply 1 application topically 2 (two) times daily. 04/08/19  Yes Edrick Kins, DPM  glipiZIDE (GLUCOTROL) 5 MG tablet Take 1 tablet (5 mg total) by mouth daily before breakfast. 02/11/19  Yes Kerin Perna, NP  glucose blood (TRUE METRIX BLOOD GLUCOSE TEST) test strip Use as instructed 03/22/18  Yes Fulp, Cammie, MD  Insulin Glargine (BASAGLAR KWIKPEN) 100 UNIT/ML SOPN Inject 0.5 mLs (50 Units total) into the skin at bedtime. 02/12/19  Yes Newlin, Charlane Ferretti, MD  insulin glargine (LANTUS) 100 UNIT/ML injection Inject 50 Units into the skin at bedtime.   Yes [provider]  Insulin Pen Needle 31G X 5 MM MISC Inject 10 units subcutaneous at bedtime 04/28/16  Yes Jegede, Olugbemiga E, MD  losartan (COZAAR) 25 MG tablet Take 1 tablet (25 mg total) by mouth daily. 02/11/19  Yes Kerin Perna, NP  metFORMIN (GLUCOPHAGE-XR) 500 MG 24 hr tablet Take 2 tablets (1,000 mg total) by mouth 2 (two) times daily. 02/11/19  Yes Kerin Perna,  NP  omeprazole (PRILOSEC) 20 MG capsule Take 1 capsule (20 mg total) by mouth daily. 03/26/19  Yes Kerin Perna, NP  terbinafine (LAMISIL) 250 MG tablet Take 1 tablet (250 mg total) by mouth daily. 04/08/19  Yes Edrick Kins, DPM  TRUEPLUS LANCETS 28G MISC Use as directed 03/22/18  Yes Fulp, Cammie, MD    Allergies: No Known Allergies  Review of Systems: Review  of Systems  Constitutional: Negative for chills and fever.  Respiratory: Negative for shortness of breath.   Cardiovascular: Negative for chest pain.  Gastrointestinal: Negative for abdominal pain, nausea and vomiting.  Skin: Negative for rash.    Physical Exam BP 126/78   Pulse 76   Temp (!) 97.5 F (36.4 C) (Temporal)   Resp 12   Ht _0  (1.651 m)   Wt 255 lb 9.6 oz (115.9 kg)   LMP 04/08/2019 (Approximate)   SpO2 95%   BMI 42.53 kg/m  CONSTITUTIONAL: No acute distress HEENT:  Normocephalic, atraumatic, extraocular motion intact. RESPIRATORY:  Lungs are clear, and breath sounds are equal bilaterally. Normal respiratory effort without pathologic use of accessory muscles. CARDIOVASCULAR: Heart is regular without murmurs, gallops, or rubs. GI: The abdomen is soft, obese, nondistended, with no tenderness to palpation.  Patient has a well-healed upper midline scar with some palpable hernia defects particular in the lower aspect.  These were reducible.  NEUROLOGIC:  Motor and sensation is grossly normal.  Cranial nerves are grossly intact. PSYCH:  Alert and oriented to person, place and time. Affect is normal.  Labs/Imaging: None recently  Assessment and Plan: This is a 43 y.o. female with an incisional hernia status post open transduodenal ampullectomy in March 2017.  -Patient scheduled for open incisional hernia repair with mesh on 05/09/2019.  Discussed with her that we would do this is a retrorectus approach with mesh placement.  Discussed with her that we would excise her previous midline scar but she will also have small stab wounds in the lateral aspect of the rectus muscle for sutures to fix the mesh in place.  Discussed with her that we will be admitting her to the hospital postoperatively for pain control and monitoring of her incision.  Discussed with her that based on the amount of scarring and manipulation of her small bowel she may end up staying in the hospital longer  until her bowels wake up after surgery.  Discussed the restrictions of no heavy lifting or pushing of nothing more than 10 to 15 pounds for period of 6 weeks after surgery to make sure everything heals well so that this does not recur in the future.  Discussed again risks of bleeding, infection, injury to surrounding structures and she is willing to proceed.  Covid test was done yesterday and it is negative.  Face-to-face time spent with the patient and care providers was 25 minutes, with more than 50% of the time spent counseling, educating, and coordinating care of the patient.     Melvyn Neth, [facility]

## 2019-05-07 NOTE — Progress Notes (Signed)
05/07/2019  History of Present Illness: Julie Robertson is a 43 y.o. female presenting for follow-up of her incisional hernias.  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  She was seen on 9/25 after CT scan had been done showing an incisional hernia with multiple hernia defects and Swiss cheese type fashion under her upper midline incision.  Surgery was scheduled for 05/09/2019 she presents today for H&P update.  She denies any new or worsening issues and reports of feeling the palpable hernias in her upper abdominal wall.  Denies any episodes of obstruction or incarceration or strangulation.  Past Medical History: Past Medical History:  Diagnosis Date  . Anxiety   . Asthma   . Diabetes mellitus without complication ([place])   . Diabetic neuropathy ([place]) 12/2013   vs carpal tunnell.  numbness tingling in right fingers. rx with Gabapentin.  Marland Kitchen Dyslipidemia 08/2009  . Dyspnea   . Gall stones 08/2009  . GERD (gastroesophageal reflux disease)   . Obesity    BMI 41, 250# 03/2015     Past Surgical History: Past Surgical History:  Procedure Laterality Date  . CHOLECYSTECTOMY N/A 03/10/2015   Procedure: LAPAROSCOPIC CHOLECYSTECTOMY WITH INTRAOPERATIVE CHOLANGIOGRAM;  Surgeon: Georganna Skeans, MD;  Location: [facility];  Service: General;  Laterality: N/A;  . ERCP N/A 03/11/2015   Procedure: ENDOSCOPIC RETROGRADE CHOLANGIOPANCREATOGRAPHY (ERCP);  Surgeon: Milus Banister, MD;  Location: [facility];  Service: Endoscopy;  Laterality: N/A;  . ESOPHAGOGASTRODUODENOSCOPY (EGD) WITH PROPOFOL N/A 07/25/2018   Procedure: ESOPHAGOGASTRODUODENOSCOPY (EGD) WITH PROPOFOL;  Surgeon: Rush Landmark Telford Nab., MD;  Location: [facility];  Service: Gastroenterology;  Laterality: N/A;  . Transduodenal Ampullectomy  09/2015  . TUBAL LIGATION      Home Medications: Prior to Admission medications   Medication Sig Start Date End Date Taking? Authorizing Provider  albuterol  (VENTOLIN HFA) 108 (90 Base) MCG/ACT inhaler Inhale 2 puffs into the lungs every 6 (six) hours as needed for wheezing or shortness of breath. 12/04/18  Yes Kerin Perna, NP  atorvastatin (LIPITOR) 40 MG tablet Take 1 tablet (40 mg total) by mouth at bedtime. 02/11/19  Yes Kerin Perna, NP  Blood Glucose Monitoring Suppl (TRUE METRIX METER) w/Device KIT Use as directed 04/12/17  Yes Hairston, Mandesia R, FNP  gabapentin (NEURONTIN) 300 MG capsule Take 1 capsule (300 mg total) by mouth 3 (three) times daily. 02/11/19  Yes Kerin Perna, NP  gentamicin cream (GARAMYCIN) 0.1 % Apply 1 application topically 2 (two) times daily. 04/08/19  Yes Edrick Kins, DPM  glipiZIDE (GLUCOTROL) 5 MG tablet Take 1 tablet (5 mg total) by mouth daily before breakfast. 02/11/19  Yes Kerin Perna, NP  glucose blood (TRUE METRIX BLOOD GLUCOSE TEST) test strip Use as instructed 03/22/18  Yes Fulp, Cammie, MD  Insulin Glargine (BASAGLAR KWIKPEN) 100 UNIT/ML SOPN Inject 0.5 mLs (50 Units total) into the skin at bedtime. 02/12/19  Yes Newlin, Charlane Ferretti, MD  insulin glargine (LANTUS) 100 UNIT/ML injection Inject 50 Units into the skin at bedtime.   Yes [provider]  Insulin Pen Needle 31G X 5 MM MISC Inject 10 units subcutaneous at bedtime 04/28/16  Yes Jegede, Olugbemiga E, MD  losartan (COZAAR) 25 MG tablet Take 1 tablet (25 mg total) by mouth daily. 02/11/19  Yes Kerin Perna, NP  metFORMIN (GLUCOPHAGE-XR) 500 MG 24 hr tablet Take 2 tablets (1,000 mg total) by mouth 2 (two) times daily. 02/11/19  Yes Kerin Perna,  NP  omeprazole (PRILOSEC) 20 MG capsule Take 1 capsule (20 mg total) by mouth daily. 03/26/19  Yes Kerin Perna, NP  terbinafine (LAMISIL) 250 MG tablet Take 1 tablet (250 mg total) by mouth daily. 04/08/19  Yes Edrick Kins, DPM  TRUEPLUS LANCETS 28G MISC Use as directed 03/22/18  Yes Fulp, Cammie, MD    Allergies: No Known Allergies  Review of Systems: Review  of Systems  Constitutional: Negative for chills and fever.  Respiratory: Negative for shortness of breath.   Cardiovascular: Negative for chest pain.  Gastrointestinal: Negative for abdominal pain, nausea and vomiting.  Skin: Negative for rash.    Physical Exam BP 126/78   Pulse 76   Temp (!) 97.5 F (36.4 C) (Temporal)   Resp 12   Ht _0  (1.651 m)   Wt 255 lb 9.6 oz (115.9 kg)   LMP 04/08/2019 (Approximate)   SpO2 95%   BMI 42.53 kg/m  CONSTITUTIONAL: No acute distress HEENT:  Normocephalic, atraumatic, extraocular motion intact. RESPIRATORY:  Lungs are clear, and breath sounds are equal bilaterally. Normal respiratory effort without pathologic use of accessory muscles. CARDIOVASCULAR: Heart is regular without murmurs, gallops, or rubs. GI: The abdomen is soft, obese, nondistended, with no tenderness to palpation.  Patient has a well-healed upper midline scar with some palpable hernia defects particular in the lower aspect.  These were reducible.  NEUROLOGIC:  Motor and sensation is grossly normal.  Cranial nerves are grossly intact. PSYCH:  Alert and oriented to person, place and time. Affect is normal.  Labs/Imaging: None recently  Assessment and Plan: This is a 43 y.o. female with an incisional hernia status post open transduodenal ampullectomy in March 2017.  -Patient scheduled for open incisional hernia repair with mesh on 05/09/2019.  Discussed with her that we would do this is a retrorectus approach with mesh placement.  Discussed with her that we would excise her previous midline scar but she will also have small stab wounds in the lateral aspect of the rectus muscle for sutures to fix the mesh in place.  Discussed with her that we will be admitting her to the hospital postoperatively for pain control and monitoring of her incision.  Discussed with her that based on the amount of scarring and manipulation of her small bowel she may end up staying in the hospital longer  until her bowels wake up after surgery.  Discussed the restrictions of no heavy lifting or pushing of nothing more than 10 to 15 pounds for period of 6 weeks after surgery to make sure everything heals well so that this does not recur in the future.  Discussed again risks of bleeding, infection, injury to surrounding structures and she is willing to proceed.  Covid test was done yesterday and it is negative.  Face-to-face time spent with the patient and care providers was 25 minutes, with more than 50% of the time spent counseling, educating, and coordinating care of the patient.     Melvyn Neth, [facility]

## 2019-05-08 MED ORDER — CEFAZOLIN SODIUM-DEXTROSE 2-4 GM/100ML-% IV SOLN
2.0000 g | INTRAVENOUS | Status: AC
  Administered 2019-05-09 (×2): 2 g via INTRAVENOUS

## 2019-05-09 ENCOUNTER — Other Ambulatory Visit: Payer: Self-pay

## 2019-05-09 ENCOUNTER — Inpatient Hospital Stay: Payer: Self-pay | Admitting: Certified Registered"

## 2019-05-09 ENCOUNTER — Encounter: Payer: Self-pay | Admitting: *Deleted

## 2019-05-09 ENCOUNTER — Encounter
Admission: RE | Disposition: A | Discharge status: Home Health | Payer: Self-pay | Source: Home / Self Care | Attending: Surgery

## 2019-05-09 ENCOUNTER — Inpatient Hospital Stay
Admission: RE | Admit: 2019-05-09 | Discharge: 2019-05-15 | DRG: 354 | Disposition: A | Discharge status: Home Health | Payer: Self-pay | Attending: Surgery | Admitting: Surgery

## 2019-05-09 DIAGNOSIS — E119 Type 2 diabetes mellitus without complications: Secondary | ICD-10-CM | POA: Diagnosis present

## 2019-05-09 DIAGNOSIS — Z79899 Other long term (current) drug therapy: Secondary | ICD-10-CM

## 2019-05-09 DIAGNOSIS — K432 Incisional hernia without obstruction or gangrene: Principal | ICD-10-CM

## 2019-05-09 DIAGNOSIS — K219 Gastro-esophageal reflux disease without esophagitis: Secondary | ICD-10-CM | POA: Diagnosis present

## 2019-05-09 DIAGNOSIS — E669 Obesity, unspecified: Secondary | ICD-10-CM | POA: Diagnosis present

## 2019-05-09 DIAGNOSIS — Z794 Long term (current) use of insulin: Secondary | ICD-10-CM

## 2019-05-09 DIAGNOSIS — E785 Hyperlipidemia, unspecified: Secondary | ICD-10-CM | POA: Diagnosis present

## 2019-05-09 DIAGNOSIS — J45909 Unspecified asthma, uncomplicated: Secondary | ICD-10-CM | POA: Diagnosis present

## 2019-05-09 DIAGNOSIS — R519 Headache, unspecified: Secondary | ICD-10-CM | POA: Diagnosis not present

## 2019-05-09 DIAGNOSIS — Z6841 Body Mass Index (BMI) 40.0 and over, adult: Secondary | ICD-10-CM

## 2019-05-09 DIAGNOSIS — F17210 Nicotine dependence, cigarettes, uncomplicated: Secondary | ICD-10-CM | POA: Diagnosis present

## 2019-05-09 DIAGNOSIS — Z9049 Acquired absence of other specified parts of digestive tract: Secondary | ICD-10-CM

## 2019-05-09 HISTORY — PX: INSERTION OF MESH: SHX5868

## 2019-05-09 HISTORY — PX: APPLICATION OF WOUND VAC: SHX5189

## 2019-05-09 HISTORY — PX: VENTRAL HERNIA REPAIR: SHX424

## 2019-05-09 LAB — GLUCOSE, CAPILLARY
Glucose-Capillary: 145 mg/dL — ABNORMAL HIGH (ref 70–99)
Glucose-Capillary: 183 mg/dL — ABNORMAL HIGH (ref 70–99)
Glucose-Capillary: 248 mg/dL — ABNORMAL HIGH (ref 70–99)

## 2019-05-09 LAB — POCT PREGNANCY, URINE: Preg Test, Ur: NEGATIVE

## 2019-05-09 SURGERY — REPAIR, HERNIA, VENTRAL
Anesthesia: General | Site: Abdomen

## 2019-05-09 MED ORDER — INSULIN ASPART 100 UNIT/ML ~~LOC~~ SOLN
0.0000 [IU] | Freq: Every day | SUBCUTANEOUS | Status: DC
  Administered 2019-05-09: 23:00:00 3 [IU] via SUBCUTANEOUS
  Administered 2019-05-12 – 2019-05-13 (×2): 2 [IU] via SUBCUTANEOUS
  Filled 2019-05-09 (×4): qty 1

## 2019-05-09 MED ORDER — INSULIN ASPART 100 UNIT/ML ~~LOC~~ SOLN
0.0000 [IU] | Freq: Three times a day (TID) | SUBCUTANEOUS | Status: DC
  Administered 2019-05-10: 18:00:00 3 [IU] via SUBCUTANEOUS
  Administered 2019-05-10 (×2): 4 [IU] via SUBCUTANEOUS
  Administered 2019-05-11: 13:00:00 7 [IU] via SUBCUTANEOUS
  Administered 2019-05-11 (×2): 3 [IU] via SUBCUTANEOUS
  Administered 2019-05-12: 18:00:00 4 [IU] via SUBCUTANEOUS
  Administered 2019-05-12: 12:00:00 7 [IU] via SUBCUTANEOUS
  Administered 2019-05-13 – 2019-05-14 (×4): 4 [IU] via SUBCUTANEOUS
  Administered 2019-05-14: 14:00:00 7 [IU] via SUBCUTANEOUS
  Administered 2019-05-14: 09:00:00 3 [IU] via SUBCUTANEOUS
  Administered 2019-05-15 (×2): 4 [IU] via SUBCUTANEOUS
  Filled 2019-05-09 (×16): qty 1

## 2019-05-09 MED ORDER — ONDANSETRON HCL 4 MG/2ML IJ SOLN
INTRAMUSCULAR | Status: AC
  Filled 2019-05-09: qty 2

## 2019-05-09 MED ORDER — ALBUTEROL SULFATE (2.5 MG/3ML) 0.083% IN NEBU
3.0000 mL | INHALATION_SOLUTION | Freq: Four times a day (QID) | RESPIRATORY_TRACT | Status: DC | PRN
  Administered 2019-05-10: 19:00:00 3 mL via RESPIRATORY_TRACT
  Filled 2019-05-09: qty 3

## 2019-05-09 MED ORDER — EPHEDRINE SULFATE 50 MG/ML IJ SOLN
INTRAMUSCULAR | Status: AC
  Filled 2019-05-09: qty 1

## 2019-05-09 MED ORDER — BUPIVACAINE LIPOSOME 1.3 % IJ SUSP: 20.0000 mL | Freq: Once | INTRAMUSCULAR | Status: DC

## 2019-05-09 MED ORDER — ROCURONIUM BROMIDE 50 MG/5ML IV SOLN
INTRAVENOUS | Status: AC
  Filled 2019-05-09: qty 1

## 2019-05-09 MED ORDER — CHLORHEXIDINE GLUCONATE CLOTH 2 % EX PADS: 6.0000 | MEDICATED_PAD | Freq: Once | CUTANEOUS | Status: DC

## 2019-05-09 MED ORDER — DEXAMETHASONE SODIUM PHOSPHATE 10 MG/ML IJ SOLN
INTRAMUSCULAR | Status: DC | PRN
  Administered 2019-05-09: 5 mg via INTRAVENOUS

## 2019-05-09 MED ORDER — DEXMEDETOMIDINE HCL IN NACL 200 MCG/50ML IV SOLN
INTRAVENOUS | Status: DC | PRN
  Administered 2019-05-09: 12 ug via INTRAVENOUS

## 2019-05-09 MED ORDER — FENTANYL CITRATE (PF) 100 MCG/2ML IJ SOLN
INTRAMUSCULAR | Status: AC
  Filled 2019-05-09: qty 2

## 2019-05-09 MED ORDER — KETAMINE HCL 50 MG/ML IJ SOLN
INTRAMUSCULAR | Status: AC
  Filled 2019-05-09: qty 10

## 2019-05-09 MED ORDER — ROCURONIUM BROMIDE 100 MG/10ML IV SOLN
INTRAVENOUS | Status: DC | PRN
  Administered 2019-05-09 (×2): 10 mg via INTRAVENOUS
  Administered 2019-05-09: 30 mg via INTRAVENOUS
  Administered 2019-05-09: 10 mg via INTRAVENOUS
  Administered 2019-05-09: 20 mg via INTRAVENOUS
  Administered 2019-05-09: 15 mg via INTRAVENOUS
  Administered 2019-05-09: 5 mg via INTRAVENOUS
  Administered 2019-05-09: 20 mg via INTRAVENOUS

## 2019-05-09 MED ORDER — KETOROLAC TROMETHAMINE 30 MG/ML IJ SOLN: 30.0000 mg | Freq: Once | INTRAMUSCULAR | Status: DC | PRN

## 2019-05-09 MED ORDER — ENOXAPARIN SODIUM 40 MG/0.4ML ~~LOC~~ SOLN
40.0000 mg | SUBCUTANEOUS | Status: DC
  Administered 2019-05-10 – 2019-05-15 (×6): 40 mg via SUBCUTANEOUS
  Filled 2019-05-09 (×6): qty 0.4

## 2019-05-09 MED ORDER — DEXAMETHASONE SODIUM PHOSPHATE 4 MG/ML IJ SOLN
5.0000 mg | Freq: Once | INTRAMUSCULAR | Status: AC
  Administered 2019-05-09: 19:00:00 5 mg via INTRAVENOUS

## 2019-05-09 MED ORDER — ACETAMINOPHEN 500 MG PO TABS
1000.0000 mg | ORAL_TABLET | Freq: Four times a day (QID) | ORAL | Status: DC
  Administered 2019-05-09 – 2019-05-15 (×21): 1000 mg via ORAL
  Filled 2019-05-09 (×23): qty 2

## 2019-05-09 MED ORDER — IPRATROPIUM-ALBUTEROL 0.5-2.5 (3) MG/3ML IN SOLN
3.0000 mL | Freq: Once | RESPIRATORY_TRACT | Status: AC
  Administered 2019-05-09: 18:00:00 3 mL via RESPIRATORY_TRACT

## 2019-05-09 MED ORDER — ALBUMIN HUMAN 5 % IV SOLN
INTRAVENOUS | Status: DC | PRN
  Administered 2019-05-09 (×2): via INTRAVENOUS

## 2019-05-09 MED ORDER — GABAPENTIN 300 MG PO CAPS
ORAL_CAPSULE | ORAL | Status: AC
  Administered 2019-05-09: 11:00:00 300 mg via ORAL
  Filled 2019-05-09: qty 1

## 2019-05-09 MED ORDER — MEPERIDINE HCL 50 MG/ML IJ SOLN: 6.2500 mg | INTRAMUSCULAR | Status: DC | PRN

## 2019-05-09 MED ORDER — SUCCINYLCHOLINE CHLORIDE 20 MG/ML IJ SOLN
INTRAMUSCULAR | Status: AC
  Filled 2019-05-09: qty 1

## 2019-05-09 MED ORDER — KETOROLAC TROMETHAMINE 30 MG/ML IJ SOLN
30.0000 mg | Freq: Four times a day (QID) | INTRAMUSCULAR | Status: AC
  Administered 2019-05-09 – 2019-05-14 (×20): 30 mg via INTRAVENOUS
  Filled 2019-05-09 (×20): qty 1

## 2019-05-09 MED ORDER — SODIUM CHLORIDE 0.9 % IV SOLN
INTRAVENOUS | Status: DC | PRN
  Administered 2019-05-09: 17:00:00 100 mL

## 2019-05-09 MED ORDER — EPINEPHRINE PF 1 MG/ML IJ SOLN
INTRAMUSCULAR | Status: AC
  Filled 2019-05-09: qty 1

## 2019-05-09 MED ORDER — RACEPINEPHRINE HCL 2.25 % IN NEBU
0.5000 mL | INHALATION_SOLUTION | Freq: Once | RESPIRATORY_TRACT | Status: AC
  Administered 2019-05-09: 19:00:00 0.5 mL via RESPIRATORY_TRACT

## 2019-05-09 MED ORDER — GABAPENTIN 300 MG PO CAPS
300.0000 mg | ORAL_CAPSULE | Freq: Three times a day (TID) | ORAL | Status: DC
  Administered 2019-05-10 – 2019-05-15 (×17): 300 mg via ORAL
  Filled 2019-05-09 (×17): qty 1
  Filled 2019-05-09: qty 3

## 2019-05-09 MED ORDER — ACETAMINOPHEN 160 MG/5ML PO SOLN
325.0000 mg | ORAL | Status: DC | PRN
  Filled 2019-05-09: qty 20.3

## 2019-05-09 MED ORDER — EPHEDRINE SULFATE 50 MG/ML IJ SOLN
INTRAMUSCULAR | Status: DC | PRN
  Administered 2019-05-09 (×3): 10 mg via INTRAVENOUS
  Administered 2019-05-09: 5 mg via INTRAVENOUS

## 2019-05-09 MED ORDER — POLYETHYLENE GLYCOL 3350 17 G PO PACK
17.0000 g | PACK | Freq: Every day | ORAL | Status: DC | PRN
  Administered 2019-05-12 – 2019-05-13 (×2): 17 g via ORAL
  Filled 2019-05-09 (×2): qty 1

## 2019-05-09 MED ORDER — OXYCODONE HCL 5 MG PO TABS
5.0000 mg | ORAL_TABLET | ORAL | Status: DC | PRN
  Administered 2019-05-10 – 2019-05-13 (×12): 10 mg via ORAL
  Filled 2019-05-09 (×12): qty 2

## 2019-05-09 MED ORDER — KETOROLAC TROMETHAMINE 30 MG/ML IJ SOLN
INTRAMUSCULAR | Status: DC | PRN
  Administered 2019-05-09: 30 mg via INTRAVENOUS

## 2019-05-09 MED ORDER — HYDROMORPHONE HCL 1 MG/ML IJ SOLN
INTRAMUSCULAR | Status: DC | PRN
  Administered 2019-05-09: .5 mg via INTRAVENOUS
  Administered 2019-05-09: 1 mg via INTRAVENOUS
  Administered 2019-05-09: .5 mg via INTRAVENOUS

## 2019-05-09 MED ORDER — IPRATROPIUM-ALBUTEROL 0.5-2.5 (3) MG/3ML IN SOLN
RESPIRATORY_TRACT | Status: AC
  Filled 2019-05-09: qty 3

## 2019-05-09 MED ORDER — TERBINAFINE HCL 250 MG PO TABS
250.0000 mg | ORAL_TABLET | Freq: Every day | ORAL | Status: DC
  Administered 2019-05-10 – 2019-05-15 (×6): 250 mg via ORAL
  Filled 2019-05-09 (×6): qty 1

## 2019-05-09 MED ORDER — PHENYLEPHRINE HCL (PRESSORS) 10 MG/ML IV SOLN
INTRAVENOUS | Status: DC | PRN
  Administered 2019-05-09 (×6): 100 ug via INTRAVENOUS

## 2019-05-09 MED ORDER — SODIUM CHLORIDE 0.9 % IV SOLN
INTRAVENOUS | Status: DC
  Administered 2019-05-09 (×3): via INTRAVENOUS

## 2019-05-09 MED ORDER — LOSARTAN POTASSIUM 25 MG PO TABS
25.0000 mg | ORAL_TABLET | Freq: Every day | ORAL | Status: DC
  Administered 2019-05-10 – 2019-05-15 (×6): 25 mg via ORAL
  Filled 2019-05-09 (×6): qty 1

## 2019-05-09 MED ORDER — SUCCINYLCHOLINE CHLORIDE 20 MG/ML IJ SOLN
INTRAMUSCULAR | Status: DC | PRN
  Administered 2019-05-09: 120 mg via INTRAVENOUS

## 2019-05-09 MED ORDER — CEFAZOLIN SODIUM 1 G IJ SOLR
INTRAMUSCULAR | Status: AC
  Filled 2019-05-09: qty 20

## 2019-05-09 MED ORDER — GABAPENTIN 300 MG PO CAPS
300.0000 mg | ORAL_CAPSULE | ORAL | Status: AC
  Administered 2019-05-09: 11:00:00 300 mg via ORAL

## 2019-05-09 MED ORDER — HYDROMORPHONE HCL 1 MG/ML IJ SOLN
0.2500 mg | INTRAMUSCULAR | Status: DC | PRN
  Administered 2019-05-09 (×5): 0.25 mg via INTRAVENOUS

## 2019-05-09 MED ORDER — SODIUM CHLORIDE 0.9 % IV SOLN
INTRAVENOUS | Status: DC | PRN
  Administered 2019-05-09: 25 ug/min via INTRAVENOUS

## 2019-05-09 MED ORDER — HYDROMORPHONE HCL 1 MG/ML IJ SOLN
INTRAMUSCULAR | Status: AC
  Filled 2019-05-09: qty 1

## 2019-05-09 MED ORDER — FENTANYL CITRATE (PF) 100 MCG/2ML IJ SOLN
25.0000 ug | INTRAMUSCULAR | Status: DC | PRN
  Administered 2019-05-09 (×2): 50 ug via INTRAVENOUS

## 2019-05-09 MED ORDER — IPRATROPIUM-ALBUTEROL 0.5-2.5 (3) MG/3ML IN SOLN
RESPIRATORY_TRACT | Status: AC
  Administered 2019-05-09: 3 mL via RESPIRATORY_TRACT
  Filled 2019-05-09: qty 3

## 2019-05-09 MED ORDER — PANTOPRAZOLE SODIUM 40 MG IV SOLR
40.0000 mg | Freq: Every day | INTRAVENOUS | Status: DC
  Administered 2019-05-09 – 2019-05-10 (×2): 40 mg via INTRAVENOUS
  Filled 2019-05-09: qty 40

## 2019-05-09 MED ORDER — LIDOCAINE HCL (CARDIAC) PF 100 MG/5ML IV SOSY
PREFILLED_SYRINGE | INTRAVENOUS | Status: DC | PRN
  Administered 2019-05-09: 100 mg via INTRAVENOUS

## 2019-05-09 MED ORDER — FENTANYL CITRATE (PF) 100 MCG/2ML IJ SOLN
INTRAMUSCULAR | Status: DC | PRN
  Administered 2019-05-09 (×4): 50 ug via INTRAVENOUS

## 2019-05-09 MED ORDER — BUPIVACAINE LIPOSOME 1.3 % IJ SUSP
INTRAMUSCULAR | Status: AC
  Filled 2019-05-09: qty 20

## 2019-05-09 MED ORDER — DEXAMETHASONE SODIUM PHOSPHATE 4 MG/ML IJ SOLN
INTRAMUSCULAR | Status: AC
  Administered 2019-05-09: 5 mg via INTRAVENOUS
  Filled 2019-05-09: qty 2

## 2019-05-09 MED ORDER — LACTATED RINGERS IV SOLN
INTRAVENOUS | Status: DC
  Administered 2019-05-09 – 2019-05-12 (×5): via INTRAVENOUS

## 2019-05-09 MED ORDER — CEFAZOLIN SODIUM-DEXTROSE 2-4 GM/100ML-% IV SOLN
INTRAVENOUS | Status: AC
  Filled 2019-05-09: qty 100

## 2019-05-09 MED ORDER — HYDROCODONE-ACETAMINOPHEN 7.5-325 MG PO TABS
1.0000 | ORAL_TABLET | Freq: Once | ORAL | Status: DC | PRN
  Filled 2019-05-09: qty 1

## 2019-05-09 MED ORDER — KETOROLAC TROMETHAMINE 30 MG/ML IJ SOLN
INTRAMUSCULAR | Status: AC
  Filled 2019-05-09: qty 1

## 2019-05-09 MED ORDER — SUGAMMADEX SODIUM 200 MG/2ML IV SOLN
INTRAVENOUS | Status: DC | PRN
  Administered 2019-05-09: 300 mg via INTRAVENOUS

## 2019-05-09 MED ORDER — HYDROMORPHONE HCL 1 MG/ML IJ SOLN
0.5000 mg | INTRAMUSCULAR | Status: DC | PRN
  Administered 2019-05-09 – 2019-05-10 (×4): 0.5 mg via INTRAVENOUS
  Filled 2019-05-09 (×4): qty 0.5

## 2019-05-09 MED ORDER — SODIUM CHLORIDE (PF) 0.9 % IJ SOLN
INTRAMUSCULAR | Status: AC
  Filled 2019-05-09: qty 50

## 2019-05-09 MED ORDER — DEXAMETHASONE SODIUM PHOSPHATE 10 MG/ML IJ SOLN
INTRAMUSCULAR | Status: AC
  Filled 2019-05-09: qty 1

## 2019-05-09 MED ORDER — ACETAMINOPHEN 500 MG PO TABS
1000.0000 mg | ORAL_TABLET | ORAL | Status: AC
  Administered 2019-05-09: 11:00:00 1000 mg via ORAL

## 2019-05-09 MED ORDER — BUPIVACAINE HCL (PF) 0.5 % IJ SOLN
INTRAMUSCULAR | Status: AC
  Filled 2019-05-09: qty 30

## 2019-05-09 MED ORDER — FENTANYL CITRATE (PF) 100 MCG/2ML IJ SOLN
INTRAMUSCULAR | Status: AC
  Administered 2019-05-09: 18:00:00 50 ug via INTRAVENOUS
  Filled 2019-05-09: qty 2

## 2019-05-09 MED ORDER — ONDANSETRON 4 MG PO TBDP
4.0000 mg | ORAL_TABLET | Freq: Four times a day (QID) | ORAL | Status: DC | PRN
  Administered 2019-05-10 – 2019-05-15 (×3): 4 mg via ORAL
  Filled 2019-05-09 (×3): qty 1

## 2019-05-09 MED ORDER — PROPOFOL 10 MG/ML IV BOLUS
INTRAVENOUS | Status: DC | PRN
  Administered 2019-05-09: 150 mg via INTRAVENOUS

## 2019-05-09 MED ORDER — LIDOCAINE HCL (PF) 2 % IJ SOLN
INTRAMUSCULAR | Status: AC
  Filled 2019-05-09: qty 10

## 2019-05-09 MED ORDER — ONDANSETRON HCL 4 MG/2ML IJ SOLN
4.0000 mg | Freq: Four times a day (QID) | INTRAMUSCULAR | Status: DC | PRN
  Administered 2019-05-13: 13:00:00 4 mg via INTRAVENOUS
  Filled 2019-05-09: qty 2

## 2019-05-09 MED ORDER — MIDAZOLAM HCL 2 MG/2ML IJ SOLN
INTRAMUSCULAR | Status: AC
  Filled 2019-05-09: qty 2

## 2019-05-09 MED ORDER — ACETAMINOPHEN 325 MG PO TABS: 325.0000 mg | ORAL_TABLET | ORAL | Status: DC | PRN

## 2019-05-09 MED ORDER — KETAMINE HCL 50 MG/ML IJ SOLN
INTRAMUSCULAR | Status: DC | PRN
  Administered 2019-05-09: 50 mg via INTRAMUSCULAR
  Administered 2019-05-09: 50 mg via INTRAVENOUS

## 2019-05-09 MED ORDER — PROMETHAZINE HCL 25 MG/ML IJ SOLN: 6.2500 mg | INTRAMUSCULAR | Status: DC | PRN

## 2019-05-09 MED ORDER — ONDANSETRON HCL 4 MG/2ML IJ SOLN
INTRAMUSCULAR | Status: DC | PRN
  Administered 2019-05-09: 4 mg via INTRAVENOUS

## 2019-05-09 MED ORDER — ACETAMINOPHEN 500 MG PO TABS
ORAL_TABLET | ORAL | Status: AC
  Administered 2019-05-09: 11:00:00 1000 mg via ORAL
  Filled 2019-05-09: qty 2

## 2019-05-09 MED ORDER — HYDROMORPHONE HCL 1 MG/ML IJ SOLN
INTRAMUSCULAR | Status: AC
  Administered 2019-05-09: 18:00:00 0.25 mg via INTRAVENOUS
  Filled 2019-05-09: qty 1

## 2019-05-09 MED ORDER — MIDAZOLAM HCL 2 MG/2ML IJ SOLN
INTRAMUSCULAR | Status: DC | PRN
  Administered 2019-05-09: 2 mg via INTRAVENOUS

## 2019-05-09 MED ORDER — RACEPINEPHRINE HCL 2.25 % IN NEBU
INHALATION_SOLUTION | RESPIRATORY_TRACT | Status: AC
  Filled 2019-05-09: qty 0.5

## 2019-05-09 SURGICAL SUPPLY — 48 items
BARRIER ADH SEPRAFILM 3INX5IN (MISCELLANEOUS) ×2 IMPLANT
BLADE CLIPPER SURG (BLADE) ×2 IMPLANT
BULB RESERV EVAC DRAIN JP 100C (MISCELLANEOUS) ×4 IMPLANT
CANISTER SUCT 1200ML W/VALVE (MISCELLANEOUS) ×4 IMPLANT
CANISTER WOUND CARE 500ML ATS (WOUND CARE) ×2 IMPLANT
CHLORAPREP W/TINT 26 (MISCELLANEOUS) ×4 IMPLANT
COVER WAND RF STERILE (DRAPES) IMPLANT
DERMABOND ADVANCED (GAUZE/BANDAGES/DRESSINGS) ×2
DERMABOND ADVANCED .7 DNX12 (GAUZE/BANDAGES/DRESSINGS) ×2 IMPLANT
DRAIN CHANNEL JP 19F (MISCELLANEOUS) ×4 IMPLANT
DRAPE LAPAROTOMY 100X77 ABD (DRAPES) ×4 IMPLANT
ELECT BLADE 6.5 EXT (BLADE) ×2 IMPLANT
ELECT CAUTERY BLADE 6.4 (BLADE) ×4 IMPLANT
ELECT REM PT RETURN 9FT ADLT (ELECTROSURGICAL) ×3
ELECTRODE REM PT RTRN 9FT ADLT (ELECTROSURGICAL) ×2 IMPLANT
GLOVE PROTEXIS LATEX SZ 7.5 (GLOVE) ×15 IMPLANT
GLOVE SURG LATEX 7.5 PF (GLOVE) ×2 IMPLANT
GLOVE SURG SYN 7.0 (GLOVE) ×3 IMPLANT
GLOVE SURG SYN 7.0 PF PI (GLOVE) ×2 IMPLANT
GOWN STRL REUS W/ TWL LRG LVL3 (GOWN DISPOSABLE) ×4 IMPLANT
GOWN STRL REUS W/TWL LRG LVL3 (GOWN DISPOSABLE) ×6
KIT PREVENA INCISION MGT 13 (CANNISTER) ×2 IMPLANT
MESH SOFT 12X12IN BARD (Mesh General) ×2 IMPLANT
NEEDLE HYPO 22GX1.5 SAFETY (NEEDLE) ×6 IMPLANT
NS IRRIG 500ML POUR BTL (IV SOLUTION) ×4 IMPLANT
PACK BASIN MINOR ARMC (MISCELLANEOUS) ×4 IMPLANT
RETAINER VISCERA MED (MISCELLANEOUS) ×2 IMPLANT
SPONGE KITTNER 5P (MISCELLANEOUS) ×2 IMPLANT
SPONGE LAP 18X18 RF (DISPOSABLE) ×16 IMPLANT
STAPLER SKIN PROX 35W (STAPLE) ×2 IMPLANT
SUT ETHIBOND 0 (SUTURE) ×2 IMPLANT
SUT ETHIBOND 0 MO6 C/R (SUTURE) ×2 IMPLANT
SUT ETHILON 3-0 FS-10 30 BLK (SUTURE) ×9
SUT MNCRL AB 4-0 PS2 18 (SUTURE) ×2 IMPLANT
SUT PDS AB 0 CT1 27 (SUTURE) ×6 IMPLANT
SUT PDS AB 1 CT1 36 (SUTURE) ×8 IMPLANT
SUT PROLENE 2 0 SH DA (SUTURE) ×8 IMPLANT
SUT SILK 0 (SUTURE) ×2
SUT SILK 0 30XBRD TIE 6 (SUTURE) ×2 IMPLANT
SUT SILK 0 SH 30 (SUTURE) ×2 IMPLANT
SUT VIC AB 0 SH 27 (SUTURE) ×2 IMPLANT
SUT VIC AB 2-0 SH 27 (SUTURE) ×4
SUT VIC AB 2-0 SH 27XBRD (SUTURE) IMPLANT
SUT VIC AB 3-0 SH 27 (SUTURE)
SUT VIC AB 3-0 SH 27X BRD (SUTURE) ×6 IMPLANT
SUTURE EHLN 3-0 FS-10 30 BLK (SUTURE) IMPLANT
SYR 10ML LL (SYRINGE) ×4 IMPLANT
SYR BULB IRRIG 60ML STRL (SYRINGE) ×2 IMPLANT

## 2019-05-09 NOTE — Transfer of Care (Signed)
Immediate Anesthesia Transfer of Care Note  Patient: Julie Robertson  Procedure(s) Performed: HERNIA REPAIR VENTRAL ADULT with MESH (N/A Abdomen) INSERTION OF MESH (N/A Abdomen) APPLICATION OF WOUND VAC (N/A Abdomen)  Patient Location: PACU  Anesthesia Type:General  Level of Consciousness: sedated  Airway & Oxygen Therapy: Patient Spontanous Breathing and Patient connected to face mask oxygen  Post-op Assessment: Report given to RN and Post -op Vital signs reviewed and stable  Post vital signs: Reviewed  Last Vitals:  Vitals Value Taken Time  BP 109/54 05/09/19 1733  Temp    Pulse 106 05/09/19 1734  Resp 22 05/09/19 1734  SpO2 99 % 05/09/19 1734  Vitals shown include unvalidated device data.  Last Pain:  Vitals:   05/09/19 1030  TempSrc: Temporal  PainSc: 0-No pain         Complications: No apparent anesthesia complications

## 2019-05-09 NOTE — Interval H&P Note (Signed)
History and Physical Interval Note:  05/09/2019 11:11 AM  Julie Robertson  has presented today for surgery, with the diagnosis of incisional hernia.  The various methods of treatment have been discussed with the patient and family. After consideration of risks, benefits and other options for treatment, the patient has consented to  Procedure(s): HERNIA REPAIR VENTRAL ADULT with MESH (N/A) INSERTION OF MESH (N/A) as a surgical intervention.  The patient's history has been reviewed, patient examined, no change in status, stable for surgery.  I have reviewed the patient's chart and labs.  Questions were answered to the patient's satisfaction.     Roth Ress

## 2019-05-09 NOTE — Anesthesia Procedure Notes (Signed)
Procedure Name: Intubation Date/Time: 05/09/2019 11:53 AM Performed by: Rolla Plate, CRNA Pre-anesthesia Checklist: Patient identified, Patient being monitored, Timeout performed, Emergency Drugs available and Suction available Patient Re-evaluated:Patient Re-evaluated prior to induction Oxygen Delivery Method: Circle system utilized Preoxygenation: Pre-oxygenation with 100% oxygen Induction Type: IV induction Ventilation: Mask ventilation without difficulty Laryngoscope Size: Mac and 3 Grade View: Grade I Tube type: Oral Tube size: 7.0 mm Number of attempts: 1 Airway Equipment and Method: Stylet Placement Confirmation: ETT inserted through vocal cords under direct vision,  positive ETCO2 and breath sounds checked- equal and bilateral Secured at: 22 cm Tube secured with: Tape Dental Injury: Teeth and Oropharynx as per pre-operative assessment

## 2019-05-09 NOTE — Anesthesia Postprocedure Evaluation (Signed)
Anesthesia Post Note  Patient: Julie Robertson  Procedure(s) Performed: HERNIA REPAIR VENTRAL ADULT with MESH (N/A Abdomen) INSERTION OF MESH (N/A Abdomen) APPLICATION OF WOUND VAC (N/A Abdomen)  Patient location during evaluation: PACU Anesthesia Type: General Level of consciousness: awake and alert Pain management: pain level controlled Vital Signs Assessment: post-procedure vital signs reviewed and stable Respiratory status: spontaneous breathing, nonlabored ventilation and respiratory function stable Cardiovascular status: blood pressure returned to baseline and stable Postop Assessment: no apparent nausea or vomiting Comments: Pt developed stridor of unclear cause shortly after arrival to PACU, unclear if related to pain or anxiety or other organic cause.  Given racemic ephinephrine and IV Decadron with resolution of symptoms.       Last Vitals:  Vitals:   05/09/19 2130 05/09/19 2145  BP:    Pulse: (!) 102 100  Resp: (!) 21 (!) 22  Temp:    SpO2: 95% 95%    Last Pain:  Vitals:   05/09/19 2115  TempSrc:   PainSc: [place]

## 2019-05-09 NOTE — Anesthesia Post-op Follow-up Note (Signed)
Anesthesia QCDR form completed.        

## 2019-05-09 NOTE — Op Note (Signed)
Procedure Date:  05/09/2019  Pre-operative Diagnosis:   Incisional hernia  Post-operative Diagnosis:  Incisional hernia  Procedure:  Open Incisional Hernia Repair, Right sided transverse abdominus release, left sided retrorectus dissection, mesh placement, wound vac Prevena placement.  Surgeon:  Melvyn Neth, MD  Assistant:  Caroleen Hamman, MD.  His assistance was needed due to the complexity of the case, requiring two surgeons for dissection and hernia repair.  Anesthesia:  General endotracheal  Estimated Blood Loss:  50 ml  Specimens:  None  Complications:  None  Drains:  Left 19 Fr Blake drain above mesh, Right 19 Fr. Blake drain in subcutaneous space.  Indications for Procedure:  This is a 43 y.o. female who presents with an incisiona hernia in the upper abdomen, s/p prior open transduodenal ampullectomy in 2017.  The options of surgery versus observation were reviewed with the patient and/or family. The risks of bleeding, abscess or infection, recurrence of symptoms, injury to surrounding structures, and chronic pain were all discussed with the patient and was willing to proceed.  Description of Procedure: The patient was correctly identified in the preoperative area and brought into the operating room.  The patient was placed supine with VTE prophylaxis in place.  Appropriate time-outs were performed.  Anesthesia was induced and the patient was intubated.  Appropriate antibiotics were infused.  Foley catheter was placed.  The abdomen was prepped and draped in a sterile fashion.  A midline incision was created over the patient's prior midline incision, removing the prior scar.  Dissection was carried down the subcutaneous tissue using electrocautery reaching the patient's multiple incisional hernias.  The hernia sacs were identified and dissected carefully.  One of the hernia defects was entered carefully and the fascia was then opened along the incision without any injury to  bowel.  Adhesions to the omentum, bowel, and liver were taken down using cautery. The inferior margin of the incision was extended to go inferior to the umbilicus.  Once all the adhesions were taken down, we started with retrorectus dissection.  The posterior sheath was entered on the left side about 1 cm from the midline and the incision was extended proximally and distally.  The same was done on the right side.  The posterior sheath was then dissected off the rectus muscle laterally, trying to preserve the perforator vessels as much as possible.  Once the dissection was completed, the posterior sheaths were attempted to be closed with running 0 PDS suture, but it was noted that the closure would be too tight and not tension-free.  It was then decided to proceed with a right sided transverse abdominus release. The fascia for the transverse abdominus was incised and the incision was extended proximally and laterally.  The muscle was then bluntly dissected off the peritoneum laterally along the entire length of the right side.  In doing so, two tears into the peritoneum were created, and these were closed using 2-0 Vicryl suture without complications.  Careful dissection was done to maintain good hemostasis.  Dissection was carried laterally all the way to the posterior axillary line.  At that point, the edges of the posterior sheaths were easily approximated and the closure with 0 PDS suture was completed.  A Bard soft mesh, size 30.5 x 30.5 cm was brought into the field and placed over the peritoneum in diamond configuration.  The mesh laid down without wrinkles or folds.  A 19 Fr. Blake drain was introduced from left lateral wall and placed over the  inferior portion of the mesh. 100 ml of Exparel solution mixed with 0.5% Bupivacaine with epi and saline was infiltrated onto the fascia and subcutaneous tissues.  The hernia sacs over the subcutenaous tissue were excised.  The anterior fascia was then closed using  running #1 PDS sutures.  The wound cavity was then irrigated thoroughly.  A 19 Fr. Blake drain was introduced from the right lateral wall and placed in the subcutaneous space.  The skin was then closed with staples.  The two drains were secured to skin using 3-0 Nylon.  A Prevena Plus wound vac was cut to size and placed over the midline incision and connected to suction.  The drains were dressed with 4x4 gauze and Tegaderm.  The patient was emerged from anesthesia and extubated and brought to the recovery room for further management.  The patient tolerated the procedure well and all counts were correct at the end of the case.   Melvyn Neth, MD

## 2019-05-09 NOTE — Anesthesia Preprocedure Evaluation (Addendum)
Anesthesia Evaluation  Patient identified by MRN, date of birth, ID band Patient awake    Reviewed: Allergy & Precautions, H&P , NPO status , reviewed documented beta blocker date and time   Airway Mallampati: II  TM Distance: >3 FB Neck ROM: full    Dental  (+) Partial Lower, Implants Perm implant, upper:   Pulmonary shortness of breath, asthma , Current Smoker and Patient abstained from smoking.,    Pulmonary exam normal        Cardiovascular hypertension, Normal cardiovascular exam     Neuro/Psych Anxiety    GI/Hepatic GERD  Controlled,  Endo/Other  diabetes  Renal/GU      Musculoskeletal   Abdominal   Peds  Hematology  (+) Blood dyscrasia, anemia ,   Anesthesia Other Findings Past Medical History: No date: Anxiety No date: Asthma No date: Diabetes mellitus without complication ([place]) [id number]: Diabetic neuropathy ([facility])     Comment:  vs carpal tunnell.  numbness tingling in right fingers.               rx with Gabapentin. 08/2009: Dyslipidemia No date: Dyspnea 08/2009: Gall stones No date: GERD (gastroesophageal reflux disease) No date: Obesity     Comment:  BMI 41, 250# 03/2015 Past Surgical History: 03/10/2015: CHOLECYSTECTOMY; N/A     Comment:  Procedure: LAPAROSCOPIC CHOLECYSTECTOMY WITH               INTRAOPERATIVE CHOLANGIOGRAM;  Surgeon: Georganna Skeans,               MD;  Location: [facility];  Service: General;  Laterality:               N/A; 03/11/2015: ERCP; N/A     Comment:  Procedure: ENDOSCOPIC RETROGRADE               CHOLANGIOPANCREATOGRAPHY (ERCP);  Surgeon: Milus Banister, MD;  Location: [place];  Service: Endoscopy;              Laterality: N/A; 07/25/2018: ESOPHAGOGASTRODUODENOSCOPY (EGD) WITH PROPOFOL; N/A     Comment:  Procedure: ESOPHAGOGASTRODUODENOSCOPY (EGD) WITH               PROPOFOL;  Surgeon: Rush Landmark Telford Nab., MD;                Location[facility];   Service: Gastroenterology;                Laterality: N/A; 09/2015: Transduodenal Ampullectomy No date: TUBAL LIGATION BMI    Body Mass Index: 42.53 kg/m     Reproductive/Obstetrics                            Anesthesia Physical Anesthesia Plan  ASA: III  Anesthesia Plan: General   Post-op Pain Management:    Induction: Intravenous  PONV Risk Score and Plan: 3 and Midazolam, Dexamethasone, Ondansetron and Treatment may vary due to age or medical condition  Airway Management Planned: Oral ETT  Additional Equipment:   Intra-op Plan:   Post-operative Plan: Extubation in OR  Informed Consent: I have reviewed the patients History and Physical, chart, labs and discussed the procedure including the risks, benefits and alternatives for the proposed anesthesia with the patient or authorized representative who has indicated his/her understanding and acceptance.     Dental Advisory Given  Plan Discussed with: CRNA  Anesthesia Plan  Comments:         Anesthesia Quick Evaluation

## 2019-05-10 ENCOUNTER — Encounter: Payer: Self-pay | Admitting: Surgery

## 2019-05-10 LAB — CBC WITH DIFFERENTIAL/PLATELET
Abs Immature Granulocytes: 0.04 10*3/uL (ref 0.00–0.07)
Basophils Absolute: 0 10*3/uL (ref 0.0–0.1)
Basophils Relative: 0 %
Eosinophils Absolute: 0 10*3/uL (ref 0.0–0.5)
Eosinophils Relative: 0 %
HCT: 36.6 % (ref 36.0–46.0)
Hemoglobin: 11.3 g/dL — ABNORMAL LOW (ref 12.0–15.0)
Immature Granulocytes: 0 %
Lymphocytes Relative: 12 %
Lymphs Abs: 1.5 10*3/uL (ref 0.7–4.0)
MCH: 25.7 pg — ABNORMAL LOW (ref 26.0–34.0)
MCHC: 30.9 g/dL (ref 30.0–36.0)
MCV: 83.4 fL (ref 80.0–100.0)
Monocytes Absolute: 0.9 10*3/uL (ref 0.1–1.0)
Monocytes Relative: 7 %
Neutro Abs: 10.1 10*3/uL — ABNORMAL HIGH (ref 1.7–7.7)
Neutrophils Relative %: 81 %
Platelets: 229 10*3/uL (ref 150–400)
RBC: 4.39 MIL/uL (ref 3.87–5.11)
RDW: 14.4 % (ref 11.5–15.5)
WBC: 12.6 10*3/uL — ABNORMAL HIGH (ref 4.0–10.5)
nRBC: 0 % (ref 0.0–0.2)

## 2019-05-10 LAB — BASIC METABOLIC PANEL
Anion gap: 7 (ref 5–15)
BUN: 12 mg/dL (ref 6–20)
CO2: 22 mmol/L (ref 22–32)
Calcium: 7.1 mg/dL — ABNORMAL LOW (ref 8.9–10.3)
Chloride: 111 mmol/L (ref 98–111)
Creatinine, Ser: 0.84 mg/dL (ref 0.44–1.00)
GFR calc Af Amer: >60 mL/min (ref >60)
GFR calc non Af Amer: >60 mL/min (ref >60)
Glucose, Bld: 218 mg/dL — ABNORMAL HIGH (ref 70–99)
Potassium: 4.7 mmol/L (ref 3.5–5.1)
Sodium: 140 mmol/L (ref 135–145)

## 2019-05-10 LAB — MAGNESIUM: Magnesium: 1.9 mg/dL (ref 1.7–2.4)

## 2019-05-10 LAB — GLUCOSE, CAPILLARY
Glucose-Capillary: 161 mg/dL — ABNORMAL HIGH (ref 70–99)
Glucose-Capillary: 168 mg/dL — ABNORMAL HIGH (ref 70–99)
Glucose-Capillary: 138 mg/dL — ABNORMAL HIGH (ref 70–99)
Glucose-Capillary: 140 mg/dL — ABNORMAL HIGH (ref 70–99)

## 2019-05-10 LAB — HIV ANTIBODY (ROUTINE TESTING W REFLEX): HIV Screen 4th Generation wRfx: NONREACTIVE

## 2019-05-10 MED ORDER — ALBUTEROL SULFATE (2.5 MG/3ML) 0.083% IN NEBU
3.0000 mL | INHALATION_SOLUTION | Freq: Four times a day (QID) | RESPIRATORY_TRACT | Status: DC
  Administered 2019-05-11 – 2019-05-12 (×4): 3 mL via RESPIRATORY_TRACT
  Filled 2019-05-10 (×5): qty 3

## 2019-05-10 MED ORDER — PHENOL 1.4 % MT LIQD
1.0000 | OROMUCOSAL | Status: DC | PRN
  Administered 2019-05-10 – 2019-05-11 (×4): 1 via OROMUCOSAL
  Filled 2019-05-10: qty 177

## 2019-05-10 MED ORDER — NICOTINE 21 MG/24HR TD PT24
21.0000 mg | MEDICATED_PATCH | Freq: Every day | TRANSDERMAL | Status: DC
  Administered 2019-05-10 – 2019-05-15 (×6): 21 mg via TRANSDERMAL
  Filled 2019-05-10 (×6): qty 1

## 2019-05-10 MED ORDER — SIMETHICONE 80 MG PO CHEW
80.0000 mg | CHEWABLE_TABLET | Freq: Four times a day (QID) | ORAL | Status: DC | PRN
  Filled 2019-05-10: qty 1

## 2019-05-10 MED ORDER — HYDROMORPHONE HCL 1 MG/ML IJ SOLN
0.5000 mg | INTRAMUSCULAR | Status: DC | PRN
  Administered 2019-05-11 – 2019-05-14 (×8): 0.5 mg via INTRAVENOUS
  Filled 2019-05-10 (×8): qty 0.5

## 2019-05-10 MED ORDER — ALBUTEROL SULFATE (2.5 MG/3ML) 0.083% IN NEBU: 2.5000 mg | INHALATION_SOLUTION | RESPIRATORY_TRACT | Status: DC | PRN

## 2019-05-10 NOTE — Progress Notes (Signed)
C/o chest pain, sob, respiration was 32 and wheezing. Albuterol nebs given and seemed to help. MD was at bedside and assess pt as well.

## 2019-05-10 NOTE — Progress Notes (Signed)
[facility] Hospital Day(s): 1.   Post op day(s): 1 Day Post-Op.   Interval History:  Patient seen and examined no acute events or new complaints overnight.  Patient reports abdominal soreness around her incision and intermittent nausea. No fever, chills, emesis. No significant bowel function yet Mild leukocytosis (12k) JP (Left) - 115 ccs  JP (Right) - 25 ccs Prevena in place On CLD   Vital signs in last 24 hours: [min-max] current  Temp:  [97.6 F (36.4 C)-99.2 F (37.3 C)] 98 F (36.7 C) (11/06 0416) Pulse Rate:  [77-115] 80 (11/06 0416) Resp:  [11-25] 20 (11/06 0416) BP: (104-127)/(52-72) 113/72 (11/06 0416) SpO2:  [92 %-99 %] 97 % (11/06 0416) Weight:  [115.9 kg] 115.9 kg (11/05 1030)     Height: 5\' 5"  (165.1 cm) Weight: 115.9 kg BMI (Calculated): 42.53   Intake/Output last 2 shifts:  11/05 0701 - 11/06 0700 In: 3500 [I.V.:3000; IV Piggyback:500] Out: 1440 [Urine:1250; Drains:140; Blood:50]   Physical Exam:  Constitutional: alert, cooperative and no distress  Respiratory: breathing non-labored at rest, on [place]  Cardiovascular: regular rate and sinus rhythm  Gastrointestinal: soft, incisional soreness, and non-distended, JP in right and left abdomen both with serosanguinous output Integumentary: Midline laparotomy with Prevena in place, good seal   Labs:  CBC Latest Ref Rng & Units 05/10/2019 03/26/2019 12/04/2018  WBC 4.0 - 10.5 K/uL 12.6(H) 8.6 7.2  Hemoglobin 12.0 - 15.0 g/dL 11.3(L) 13.1 10.1(L)  Hematocrit 36.0 - 46.0 % 36.6 42.4 34.0  Platelets 150 - 400 K/uL 229 289 379   CMP Latest Ref Rng & Units 05/10/2019 03/26/2019 03/12/2019  Glucose 70 - 99 mg/dL 218(H) 65 -  BUN 6 - 20 mg/dL 12 12 13   Creatinine 0.44 - 1.00 mg/dL 0.84 0.68 0.67  Sodium 135 - 145 mmol/L 140 141 -  Potassium 3.5 - 5.1 mmol/L 4.7 4.2 -  Chloride 98 - 111 mmol/L 111 101 -  CO2 22 - 32 mmol/L 22 23 -  Calcium 8.9 - 10.3 mg/dL 7.1(L) 9.1 -  Total  Protein 6.0 - 8.5 g/dL - 7.1 -  Total Bilirubin 0.0 - 1.2 mg/dL - <0.2 -  Alkaline Phos 39 - 117 IU/L - 87 -  AST 0 - 40 IU/L - 31 -  ALT 0 - 32 IU/L - 43(H) -     Imaging studies: No new pertinent imaging studies   Assessment/Plan:  43 y.o. female with likely reactive leukocytosis from surgery otherwise doing well 1 Day Post-Op s/p abdominal wall reconstruction with mesh for incisional hernia.   - Continue CLD + IVF  - pain control prn; antiemetics prn  - monitor abdominal examination; on-going bowel function  - discontinue foley    - mobilization encouraged   - wean to room air   - medical management of comorbidities; add nicotine patch   All of the above findings and recommendations were discussed with the patient, and the medical team, and all of patient's questions were answered to her expressed satisfaction.  -- Edison Simon, PA-C [facility] 05/10/2019, 7:47 AM [phone] M-F: 7am - 4pm \

## 2019-05-10 NOTE — Progress Notes (Signed)
Notified MD of patient complaint of chest pain/tightness.  VSS.  Order received for Gas-X and EKG.  Continue to monitor.

## 2019-05-11 ENCOUNTER — Other Ambulatory Visit: Payer: Self-pay

## 2019-05-11 LAB — GLUCOSE, CAPILLARY
Glucose-Capillary: 125 mg/dL — ABNORMAL HIGH (ref 70–99)
Glucose-Capillary: 211 mg/dL — ABNORMAL HIGH (ref 70–99)
Glucose-Capillary: 140 mg/dL — ABNORMAL HIGH (ref 70–99)
Glucose-Capillary: 172 mg/dL — ABNORMAL HIGH (ref 70–99)

## 2019-05-11 MED ORDER — PANTOPRAZOLE SODIUM 40 MG PO TBEC
40.0000 mg | DELAYED_RELEASE_TABLET | Freq: Every day | ORAL | Status: DC
  Administered 2019-05-11 – 2019-05-14 (×4): 40 mg via ORAL
  Filled 2019-05-11 (×4): qty 1

## 2019-05-11 MED ORDER — GUAIFENESIN ER 600 MG PO TB12
600.0000 mg | ORAL_TABLET | Freq: Two times a day (BID) | ORAL | Status: DC
  Administered 2019-05-11 – 2019-05-15 (×8): 600 mg via ORAL
  Filled 2019-05-11 (×8): qty 1

## 2019-05-11 NOTE — Progress Notes (Signed)
Called Dr. Peyton Najjar regarding patient complaints of congestion. Got an order for mucinex BID. Will continue to monitor.  Christene Slates  05/11/2019 8:01 PM

## 2019-05-11 NOTE — Progress Notes (Signed)
[facility] Hospital Day(s): 2.   Post op day(s): 2 Days Post-Op.   Interval History: Patient seen and examined, no acute events or new complaints overnight. Patient reports significant pain on the abdominal wall.  There is no radiation to other part of the body.  Alleviating factor is current pain medication.  There is no aggravating factor identified at this moment.  Patient tolerating clear liquid diet.  Denies nausea or vomiting.  She reported she feels hungry.  Vital signs in last 24 hours: [min-max] current  Temp:  [97.4 F (36.3 C)-98.5 F (36.9 C)] 98.5 F (36.9 C) (11/07 0824) Pulse Rate:  [67-81] 71 (11/07 0824) Resp:  [17-32] 19 (11/07 0824) BP: (101-106)/(62-72) 106/62 (11/07 0824) SpO2:  [94 %-100 %] 98 % (11/07 0824)     Height: 5\' 5"  (165.1 cm) Weight: 115.9 kg BMI (Calculated): 42.53   Physical Exam:  Constitutional: alert, cooperative and no distress  Respiratory: breathing non-labored at rest  Cardiovascular: regular rate and sinus rhythm  Gastrointestinal: soft, non-tender, and non-distended.  Prevena in place.  Drains with serosanguineous drainage.  Labs:  CBC Latest Ref Rng & Units 05/10/2019 03/26/2019 12/04/2018  WBC 4.0 - 10.5 K/uL 12.6(H) 8.6 7.2  Hemoglobin 12.0 - 15.0 g/dL 11.3(L) 13.1 10.1(L)  Hematocrit 36.0 - 46.0 % 36.6 42.4 34.0  Platelets 150 - 400 K/uL 229 289 379   CMP Latest Ref Rng & Units 05/10/2019 03/26/2019 03/12/2019  Glucose 70 - 99 mg/dL 218(H) 65 -  BUN 6 - 20 mg/dL 12 12 13   Creatinine 0.44 - 1.00 mg/dL 0.84 0.68 0.67  Sodium 135 - 145 mmol/L 140 141 -  Potassium 3.5 - 5.1 mmol/L 4.7 4.2 -  Chloride 98 - 111 mmol/L 111 101 -  CO2 22 - 32 mmol/L 22 23 -  Calcium 8.9 - 10.3 mg/dL 7.1(L) 9.1 -  Total Protein 6.0 - 8.5 g/dL - 7.1 -  Total Bilirubin 0.0 - 1.2 mg/dL - <0.2 -  Alkaline Phos 39 - 117 IU/L - 87 -  AST 0 - 40 IU/L - 31 -  ALT 0 - 32 IU/L - 43(H) -    Imaging studies: No new pertinent imaging  studies   Assessment/Plan:  43 y.o. female with incisional hernia 2 Days Post-Op s/p abdominal reconstruction, complicated by pertinent comorbidities including anxiety, asthma, diabetes, smoker, dyslipidemia, obesity.  Patient recovering slowly.  Still with significant pain and needs IV pain medications.  I will advance diet to full liquids since patient is tolerating clear liquids and not having nausea or vomiting.  Will assess for diet toleration.  I encouraged the patient to get out of bed and ambulate.  Patient will need to continue in the hospital until pain can be controlled with oral pain medications.  Reiffton working well.  No sign of complication at this moment.    Arnold Long, MD

## 2019-05-12 LAB — GLUCOSE, CAPILLARY
Glucose-Capillary: 117 mg/dL — ABNORMAL HIGH (ref 70–99)
Glucose-Capillary: 201 mg/dL — ABNORMAL HIGH (ref 70–99)
Glucose-Capillary: 197 mg/dL — ABNORMAL HIGH (ref 70–99)
Glucose-Capillary: 234 mg/dL — ABNORMAL HIGH (ref 70–99)

## 2019-05-12 MED ORDER — IPRATROPIUM-ALBUTEROL 0.5-2.5 (3) MG/3ML IN SOLN
3.0000 mL | Freq: Three times a day (TID) | RESPIRATORY_TRACT | Status: DC
  Administered 2019-05-12 – 2019-05-13 (×3): 3 mL via RESPIRATORY_TRACT
  Filled 2019-05-12 (×3): qty 3

## 2019-05-12 NOTE — Progress Notes (Signed)
[place] Hospital Day(s): 3.   Post op day(s): 3 Days Post-Op.   Interval History: Patient seen and examined, no acute events or new complaints overnight. Patient reports feeling better today.  She reported she is feeling hungry.  She continued having significant pain on the abdominal wall.  Pain does not radiate to other part of the body.  There is no alleviating or aggravating factor.  Vital signs in last 24 hours: [min-max] current  Temp:  [98.1 F (36.7 C)-98.8 F (37.1 C)] 98.4 F (36.9 C) (11/08 0447) Pulse Rate:  [71-101] 77 (11/08 0722) Resp:  [18-24] 18 (11/08 0722) BP: (98-107)/(54-59) 104/56 (11/08 0447) SpO2:  [92 %-98 %] 94 % (11/08 0722)     Height: 5\' 5"  (165.1 cm) Weight: 115.9 kg BMI (Calculated): 42.53   Drains: 200 mL (serosanguineous)  Physical Exam:  Constitutional: alert, cooperative and no distress  Respiratory: breathing non-labored at rest  Cardiovascular: regular rate and sinus rhythm  Gastrointestinal: soft, non-tender, and non-distended.  Prevena dressing in place and working properly.  Drain in place with serosanguineous drainage.  Labs:  CBC Latest Ref Rng & Units 05/10/2019 03/26/2019 12/04/2018  WBC 4.0 - 10.5 K/uL 12.6(H) 8.6 7.2  Hemoglobin 12.0 - 15.0 g/dL 11.3(L) 13.1 10.1(L)  Hematocrit 36.0 - 46.0 % 36.6 42.4 34.0  Platelets 150 - 400 K/uL 229 289 379   CMP Latest Ref Rng & Units 05/10/2019 03/26/2019 03/12/2019  Glucose 70 - 99 mg/dL 218(H) 65 -  BUN 6 - 20 mg/dL 12 12 13   Creatinine 0.44 - 1.00 mg/dL 0.84 0.68 0.67  Sodium 135 - 145 mmol/L 140 141 -  Potassium 3.5 - 5.1 mmol/L 4.7 4.2 -  Chloride 98 - 111 mmol/L 111 101 -  CO2 22 - 32 mmol/L 22 23 -  Calcium 8.9 - 10.3 mg/dL 7.1(L) 9.1 -  Total Protein 6.0 - 8.5 g/dL - 7.1 -  Total Bilirubin 0.0 - 1.2 mg/dL - <0.2 -  Alkaline Phos 39 - 117 IU/L - 87 -  AST 0 - 40 IU/L - 31 -  ALT 0 - 32 IU/L - 43(H) -    Imaging studies: No new pertinent imaging  studies   Assessment/Plan:  43 y.o. female with incisional hernia 3 Days Post-Op s/p abdominal reconstruction, complicated by pertinent comorbidities including anxiety, asthma, diabetes, smoker, dyslipidemia, obesity. Patient recovering slowly.  Still with significant pain in need of IV pain medications.  We will continue to try to wean off IV pain still try to control the pain with oral pain medications.  I will advance the diet since patient is tolerating full liquids and she passed significant amount of gas yesterday.  I encouraged the patient to ambulate.  We will continue with Praveena in place.  Drain with same place due to significant amount of drainage.  This is expected after surgery.  Arnold Long, MD

## 2019-05-13 LAB — GLUCOSE, CAPILLARY
Glucose-Capillary: 159 mg/dL — ABNORMAL HIGH (ref 70–99)
Glucose-Capillary: 170 mg/dL — ABNORMAL HIGH (ref 70–99)
Glucose-Capillary: 179 mg/dL — ABNORMAL HIGH (ref 70–99)
Glucose-Capillary: 202 mg/dL — ABNORMAL HIGH (ref 70–99)

## 2019-05-13 MED ORDER — DOCUSATE SODIUM 100 MG PO CAPS
100.0000 mg | ORAL_CAPSULE | Freq: Every day | ORAL | Status: DC
  Administered 2019-05-13 – 2019-05-15 (×3): 100 mg via ORAL
  Filled 2019-05-13 (×3): qty 1

## 2019-05-13 MED ORDER — SALINE SPRAY 0.65 % NA SOLN
1.0000 | NASAL | Status: DC | PRN
  Filled 2019-05-13: qty 44

## 2019-05-13 MED ORDER — HYDROMORPHONE HCL 2 MG PO TABS
2.0000 mg | ORAL_TABLET | ORAL | Status: DC | PRN
  Administered 2019-05-13 – 2019-05-15 (×9): 2 mg via ORAL
  Filled 2019-05-13 (×9): qty 1

## 2019-05-13 MED ORDER — ALBUTEROL SULFATE (2.5 MG/3ML) 0.083% IN NEBU
2.5000 mg | INHALATION_SOLUTION | RESPIRATORY_TRACT | Status: DC | PRN
  Filled 2019-05-13: qty 3

## 2019-05-13 MED ORDER — IPRATROPIUM-ALBUTEROL 0.5-2.5 (3) MG/3ML IN SOLN
3.0000 mL | Freq: Four times a day (QID) | RESPIRATORY_TRACT | Status: DC
  Administered 2019-05-13 – 2019-05-15 (×9): 3 mL via RESPIRATORY_TRACT
  Filled 2019-05-13 (×9): qty 3

## 2019-05-13 MED ORDER — CYCLOBENZAPRINE HCL 10 MG PO TABS
5.0000 mg | ORAL_TABLET | Freq: Three times a day (TID) | ORAL | Status: DC
  Administered 2019-05-13 – 2019-05-15 (×7): 5 mg via ORAL
  Filled 2019-05-13 (×7): qty 1

## 2019-05-13 NOTE — Progress Notes (Signed)
[facility] Hospital Day(s): 4.   Post op day(s): 4 Days Post-Op.   Interval History:  Patient seen and examined no acute events or new complaints overnight.  Patient reports incisional pain but this is improved. No nausea or emesis Tolerating DM diet.  Prevena in place without issues.  JP (Left) - 115 JP (Right) - 80 Mobilizing some + flatus Still requiring O2  Vital signs in last 24 hours: [min-max] current  Temp:  [98.1 F (36.7 C)-98.3 F (36.8 C)] 98.3 F (36.8 C) (11/09 0530) Pulse Rate:  [74-93] 74 (11/09 0530) Resp:  [18-20] 20 (11/09 0530) BP: (117-129)/(55-74) 123/69 (11/09 0530) SpO2:  [90 %-94 %] 94 % (11/09 0530)     Height: 5\' 5"  (165.1 cm) Weight: 115.9 kg BMI (Calculated): 42.53   Intake/Output last 2 shifts:  11/08 0701 - 11/09 0700 In: 1415.3 [P.O.:840; I.V.:575.3] Out: 2295 [Urine:2100; Drains:195]   Physical Exam:  Constitutional: alert, cooperative and no distress  Respiratory: breathing non-labored at rest, on [place]  Cardiovascular: regular rate and sinus rhythm  Gastrointestinal: soft, incisional soreness, and non-distended, JP in right and left abdomen both with serosanguinous output Integumentary: Midline laparotomy with Prevena in place, good seal  Labs:  CBC Latest Ref Rng & Units 05/10/2019 03/26/2019 12/04/2018  WBC 4.0 - 10.5 K/uL 12.6(H) 8.6 7.2  Hemoglobin 12.0 - 15.0 g/dL 11.3(L) 13.1 10.1(L)  Hematocrit 36.0 - 46.0 % 36.6 42.4 34.0  Platelets 150 - 400 K/uL 229 289 379   CMP Latest Ref Rng & Units 05/10/2019 03/26/2019 03/12/2019  Glucose 70 - 99 mg/dL 218(H) 65 -  BUN 6 - 20 mg/dL 12 12 13   Creatinine 0.44 - 1.00 mg/dL 0.84 0.68 0.67  Sodium 135 - 145 mmol/L 140 141 -  Potassium 3.5 - 5.1 mmol/L 4.7 4.2 -  Chloride 98 - 111 mmol/L 111 101 -  CO2 22 - 32 mmol/L 22 23 -  Calcium 8.9 - 10.3 mg/dL 7.1(L) 9.1 -  Total Protein 6.0 - 8.5 g/dL - 7.1 -  Total Bilirubin 0.0 - 1.2 mg/dL - <0.2 -  Alkaline  Phos 39 - 117 IU/L - 87 -  AST 0 - 40 IU/L - 31 -  ALT 0 - 32 IU/L - 43(H) -     Imaging studies: No new pertinent imaging studies   Assessment/Plan:  43 y.o. female slowly improving 4 Days Post-Op s/p abdominal wall reconstruction with mesh for incisional hernia.   - Continue diabetic diet             - pain control prn; antiemetics prn             - monitor abdominal examination; on-going bowel function                - mobilization encouraged; will engage PT              - wean to room air if feasible; she did require home O2 after her last surgery             - medical management of comorbidities; add nicotine patch   All of the above findings and recommendations were discussed with the patient, and the medical team, and all of patient's questions were answered to her expressed satisfaction.  -- Edison Simon, PA-C [facility] 05/13/2019, 7:14 AM [phone] M-F: 7am - 4pm

## 2019-05-13 NOTE — Evaluation (Signed)
Occupational Therapy Evaluation Patient Details Name: Julie Robertson MRN: [id number] DOB: [birth year] Today's Date: 05/13/2019    History of Present Illness Pt admitted for incisional hernia and is s/p open incisional hernia repair on 05/09/19. 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 Pt is spanish speaking, use of interpreter utilized this date.   Clinical Impression   Pt is 43 year old female s/p open incisional repair on 05/09/19  who lives at home with her husband and her daughter. Pt was independent in all ADLs prior to surgery and is eager to return to PLOF.  She is Spanish speaking and interpreter Julie Robertson present for full session. Pt is currently limited in functional ADLs due to pain, decreased ROM, nausea and dizziness.  Pt requires maximum assist for LB dressing and bathing skills due to pain and decreased trunk flexion and would benefit from continued skilled OT services for education in assistive devices, functional mobility, and education in recommendations for home modifications to increase safety and prevent falls.  Strongly rec a BSC for use over toilet as well as Bottom Buddy for toileting hygiene, reacher and dressing stick for LB dressing skills. Pt educated and practiced use of reacher this session and  Given AD catalog with pictures and prices but not in Spanish and her daughter can help her read it and order items. Pt is a good candidate for [facility] OT to continue rehabilitation.      Follow Up Recommendations  Home health OT    Equipment Recommendations  3 in 1 bedside commode;Tub/shower seat    Recommendations for Other Services       Precautions / Restrictions Precautions Precautions: None Precaution Comments: use pillow with coughing Restrictions Weight Bearing Restrictions: No      Mobility Bed Mobility                  Transfers                      Balance                                            ADL either performed or assessed with clinical judgement   ADL Overall ADL's : Needs assistance/impaired Eating/Feeding: Independent;Set up   Grooming: Wash/dry hands;Wash/dry face;Oral care;Applying deodorant;Brushing hair;Set up;Independent   Upper Body Bathing: Independent;Set up   Lower Body Bathing: Moderate assistance;Set up;Sit to/from stand   Upper Body Dressing : Independent;Set up   Lower Body Dressing: Set up;Moderate assistance;Sit to/from stand   Toilet Transfer: Supervision/safety;Set up;BSC   Toileting- Clothing Manipulation and Hygiene: Minimal assistance;Set up [name] Manipulation Details (indicate cue type and reason): pt unable to reach for cleaning after urinating on [facility], supervised for transfer on.off BSC and rec one at home as well as [name] for hygiene       General ADL Comments: RW for mobility with min guard and rec a walker bag; Bottom buddy, reacher and sock aid as well as BSC at home rec as well.     Vision Baseline Vision/History: No visual deficits Patient Visual Report: No change from baseline       Perception     Praxis      Pertinent Vitals/Pain Pain Assessment: 0-10 Pain Score: 5  Pain Location: abdomen Pain Descriptors / Indicators: Operative site guarding Pain  Intervention(s): Limited activity within patient's tolerance;Premedicated before session;Monitored during session     Hand Dominance Right   Extremity/Trunk Assessment Upper Extremity Assessment Upper Extremity Assessment: Overall WFL for tasks assessed   Lower Extremity Assessment Lower Extremity Assessment: Overall WFL for tasks assessed       Communication Communication Communication: Interpreter utilized   Cognition Arousal/Alertness: Awake/alert Behavior During Therapy: WFL for tasks assessed/performed Overall Cognitive Status: Within Functional Limits for tasks assessed                                      General Comments       Exercises     Shoulder Instructions      Home Living Family/patient expects to be discharged to:: Private residence Living Arrangements: Spouse/significant other;Children Available Help at Discharge: Family;Available PRN/intermittently Type of Home: House Home Access: Stairs to enter [employer] of Steps: 1 Entrance Stairs-Rails: None Home Layout: One level     Bathroom Shower/Tub: Teacher, early years/pre: Standard     Home Equipment: None          Prior Functioning/Environment Level of Independence: Independent        Comments: was previously working as Electrical engineer. Hasn't been able to work due to health concerns        OT Problem List: Decreased strength;Decreased activity tolerance;Pain;Decreased knowledge of use of DME or AE;Obesity      OT Treatment/Interventions: Self-care/ADL training;DME and/or AE instruction;Patient/family education;Therapeutic activities    OT Goals(Current goals can be found in the care plan section) Acute Rehab OT Goals Patient Stated Goal: to be able to perform ADLs independently OT Goal Formulation: With patient Time For Goal Achievement: 05/27/19 Potential to Achieve Goals: Good ADL Goals Pt Will Perform Lower Body Bathing: with set-up;with adaptive equipment;sit to/from stand;Independently Pt Will Perform Lower Body Dressing: with adaptive equipment;sit to/from stand;with supervision;with set-up Pt Will Transfer to Toilet: Independently;with set-up;regular height toilet Pt Will Perform Toileting - Clothing Manipulation and hygiene: Independently;with adaptive equipment;sit to/from stand  OT Frequency: Min 2X/week   Barriers to D/C:            Co-evaluation              AM-PAC OT "6 Clicks" Daily Activity     Outcome Measure Help from another person eating meals?: None Help from another person taking care of personal grooming?: None Help  from another person toileting, which includes using toliet, bedpan, or urinal?: A Little Help from another person bathing (including washing, rinsing, drying)?: A Little Help from another person to put on and taking off regular upper body clothing?: None Help from another person to put on and taking off regular lower body clothing?: A Lot 6 Click Score: 20   End of Session    Activity Tolerance: Patient tolerated treatment well Patient left: in bed;with call bell/phone within reach(Mod fall risk)  OT Visit Diagnosis: Other abnormalities of gait and mobility (R26.89);Pain;Muscle weakness (generalized) (M62.81) Pain - Right/Left: Right Pain - part of body: (abdomen on both sides)                Time: 1330-1414 OT Time Calculation (min): 44 min Charges:  OT General Charges $OT Visit: 1 Visit OT Evaluation $OT Eval Low Complexity: 1 Low OT Treatments $Self Care/Home Management : 23-37 mins  Chrys Racer, OTR/L, [place] ascom [phone] 05/13/19, 2:33 PM

## 2019-05-13 NOTE — Evaluation (Signed)
Physical Therapy Evaluation Patient Details Name: Julie Robertson MRN: [id number] DOB: [birth year] Today's Date: 05/13/2019   History of Present Illness  Pt admitted for incisional hernia and is s/p open incisional hernia repair on 05/09/19. 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 Pt is spanish speaking, use of interpreter utilized this date.  Clinical Impression  Pt is a pleasant 43 year old female who was admitted for open incisional hernia repair surgery. Pt performs bed mobility with cga, transfers with supervision, and ambulation with cga and RW. ALl mobility performed on RA with sats decreasing along with SOB symptoms. RN notified. Pt wishes to continue mobilizing despite SOB and dizziness. Educated on safety concerns. After return to room and pursed lip breathing, able to return O2 sats to WNL. Encouraged IS and reviewed technique. Encouraged OOB mobility, sitting up in recliner and ambulation with RN staff. Pt demonstrates deficits with activity tolerance/mobility. Does require assist for ADLs, appropriate for OT consult. Multiple requests, RN made aware. Would benefit from skilled PT to address above deficits and promote optimal return to PLOF. Recommend transition to [place] upon discharge from acute hospitalization.     Follow Up Recommendations Home health PT;Supervision for mobility/OOB    Equipment Recommendations  Rolling walker with 5" wheels;3in1 (PT)    Recommendations for Other Services       Precautions / Restrictions Precautions Precautions: None Precaution Comments: use pillow with coughing Restrictions Weight Bearing Restrictions: No      Mobility  Bed Mobility Overal bed mobility: Needs Assistance Bed Mobility: Supine to Sit     Supine to sit: Min guard     General bed mobility comments: needs assist to move B LE and trunk support. Once seated at EOB, able to sit with upright  posture  Transfers Overall transfer level: Needs assistance Equipment used: Rolling walker (2 wheeled) Transfers: Sit to/from Stand Sit to Stand: Supervision         General transfer comment: safe technique. UPright posture noted  Ambulation/Gait Ambulation/Gait assistance: Min guard Gait Distance (Feet): 200 Feet Assistive device: Rolling walker (2 wheeled) Gait Pattern/deviations: Step-through pattern     General Gait Details: ambulated with reciprocal gait pattern with slow speed noted. ALl mobility performed on RA with sats decreasing to 83% with exertion. Cues for pursed lip breathing. Post exertion, O2 sats improve to 91% with rest break.  Stairs            Wheelchair Mobility    Modified Rankin (Stroke Patients Only)       Balance Overall balance assessment: Needs assistance Sitting-balance support: Feet supported Sitting balance-Leahy Scale: Normal     Standing balance support: Bilateral upper extremity supported Standing balance-Leahy Scale: Good                               Pertinent Vitals/Pain Pain Assessment: Faces Pain Score: 5  Pain Location: abdomen Pain Descriptors / Indicators: Operative site guarding Pain Intervention(s): Limited activity within patient's tolerance;Premedicated before session;Repositioned    Home Living Family/patient expects to be discharged to:: Private residence Living Arrangements: Spouse/significant other;Children Available Help at Discharge: Family;Available PRN/intermittently Type of Home: House Home Access: Stairs to enter Entrance Stairs-Rails: None Entrance Stairs-Number of Steps: 1 Home Layout: One level Home Equipment: None      Prior Function Level of Independence: Independent         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 Hasn't been able to work due to health concerns     Hand Dominance        Extremity/Trunk Assessment   Upper Extremity Assessment Upper  Extremity Assessment: Overall WFL for tasks assessed    Lower Extremity Assessment Lower Extremity Assessment: Overall WFL for tasks assessed       Communication   Communication: Interpreter utilized  Cognition Arousal/Alertness: Awake/alert Behavior During Therapy: WFL for tasks assessed/performed Overall Cognitive Status: Within Functional Limits for tasks assessed                                        General Comments      Exercises Other Exercises Other Exercises: ambulated to [facility], needs min assist for setup and hygiene. Unable to perform with independence. Needs supervision for hand washing, reports slight dizziness with functional mobility Other Exercises: educated on IS and pt able to perfom 5 reps improving to 750. Fatigues quickly   Assessment/Plan    PT Assessment Patient needs continued PT services  PT Problem List Decreased activity tolerance;Decreased balance;Decreased mobility;Obesity;Pain;Cardiopulmonary status limiting activity       PT Treatment Interventions Gait training;Therapeutic activities;Balance training    PT Goals (Current goals can be found in the Care Plan section)  Acute Rehab PT Goals Patient Stated Goal: to be able to perform ADLs independently PT Goal Formulation: With patient Time For Goal Achievement: 05/27/19 Potential to Achieve Goals: Good    Frequency Min 2X/week   Barriers to discharge Decreased caregiver support      Co-evaluation               AM-PAC PT "6 Clicks" Mobility  Outcome Measure Help needed turning from your back to your side while in a flat bed without using bedrails?: A Little Help needed moving from lying on your back to sitting on the side of a flat bed without using bedrails?: A Little Help needed moving to and from a bed to a chair (including a wheelchair)?: A Little Help needed standing up from a chair using your arms (e.g., wheelchair or bedside chair)?: A Little Help needed to  walk in hospital room?: A Little Help needed climbing 3-5 steps with a railing? : A Little 6 Click Score: 18    End of Session   Activity Tolerance: Patient tolerated treatment well Patient left: in chair(mod fall) Nurse Communication: Mobility status PT Visit Diagnosis: Muscle weakness (generalized) (M62.81);Unsteadiness on feet (R26.81);Pain Pain - Right/Left: (abdomen)    Time: [id number] PT Time Calculation (min) (ACUTE ONLY): 45 min   Charges:   PT Evaluation $PT Eval Moderate Complexity: 1 Mod PT Treatments $Gait Training: 8-22 mins $Therapeutic Activity: 8-22 mins        Greggory Stallion, PT, DPT [phone]   Julie Robertson 05/13/2019, 11:56 AM

## 2019-05-14 LAB — GLUCOSE, CAPILLARY
Glucose-Capillary: 138 mg/dL — ABNORMAL HIGH (ref 70–99)
Glucose-Capillary: 227 mg/dL — ABNORMAL HIGH (ref 70–99)
Glucose-Capillary: 197 mg/dL — ABNORMAL HIGH (ref 70–99)
Glucose-Capillary: 196 mg/dL — ABNORMAL HIGH (ref 70–99)

## 2019-05-14 MED ORDER — SUMATRIPTAN SUCCINATE 50 MG PO TABS
50.0000 mg | ORAL_TABLET | Freq: Once | ORAL | Status: AC
  Administered 2019-05-14: 50 mg via ORAL
  Filled 2019-05-14: qty 1

## 2019-05-14 MED ORDER — BISACODYL 5 MG PO TBEC
5.0000 mg | DELAYED_RELEASE_TABLET | Freq: Once | ORAL | Status: AC
  Administered 2019-05-14: 09:00:00 5 mg via ORAL
  Filled 2019-05-14: qty 1

## 2019-05-14 MED ORDER — HYDROMORPHONE HCL 1 MG/ML IJ SOLN
INTRAMUSCULAR | Status: AC
  Filled 2019-05-14: qty 1

## 2019-05-14 MED ORDER — POLYETHYLENE GLYCOL 3350 17 G PO PACK
17.0000 g | PACK | Freq: Every day | ORAL | Status: DC
  Administered 2019-05-14 – 2019-05-15 (×2): 17 g via ORAL
  Filled 2019-05-14 (×2): qty 1

## 2019-05-14 NOTE — TOC Initial Note (Signed)
Transition of Care [facility]) - Initial/Assessment Note    Patient Details  Name: Julie Robertson MRN: [id number] Date of Birth: [birth year]  [facility] CM/SW Contact:    Beverly Sessions, RN Phone Number: 05/14/2019, 4:27 PM  Clinical Narrative:                 Patient admitted with incisional hernia s/p repair  Assessment complete with patient and spanish interpreter  Patient states that she lives a home alone.  She does have a significant other, states they are not married, and is not clear if he stays there or lives in a separate home  PCP Edwards at [employer].  Patient states that she also obtains her medications from [facility]. Patient denies any issues obtaining her medications.  Patient states that she is a diabetic and obtains all of her insulin and testing supplies from them  Patient denies issues with transportation Patient states that she was employed, however due to her hernias she had to stop working  Patient assessed by PT. PT recommends home health PT.  Patient agreeable Patient with history of asthma, and requiring acute O2.   Anticipate needs for home health RN and PT.  Charity referral made to [place] with [facility].   Referral made to [facility] with [employer] for charity BSC, RW, and O2  Will need qualifying O2 sats prior to discharge.  Bedside RN notified.  Per MD patients wound vac will be changed to prevena prior to discharge.  Expected Discharge Plan: [place] Barriers to Discharge: Continued Medical Work up   Patient Goals and CMS Choice   CMS Medicare.gov Compare Post Acute Care list provided to:: Patient Choice offered to / list presented to : Patient  Expected Discharge Plan and Services Expected Discharge Plan: [place]   Discharge Planning Services: CM Consult   Living arrangements for the past 2 months: Single Family Home                 DME Arranged:  Oxygen, Walker rolling, 3-N-1 DME Agency: [employer] Date DME Agency Contacted: 05/14/19 Time DME Agency Contacted: [id number] Representative spoke with at DME Agency: [place]: RN, PT [place] Agency: [facility] [employer] Date [place]: 05/14/19 Time [place]: 1626 Representative spoke with at [place]: [place] Arrangements/Services Living arrangements for the past 2 months: [place] with:: Self Patient language and need for interpreter reviewed:: Yes Do you feel safe going back to the place where you live?: Yes      Need for Family Participation in Patient Care: No (Comment) Care giver support system in place?: Yes (comment)   Criminal Activity/Legal Involvement Pertinent to Current Situation/Hospitalization: No - Comment as needed  Activities of Daily Living Home Assistive Devices/Equipment: None ADL Screening (condition at time of admission) Patient's cognitive ability adequate to safely complete daily activities?: Yes Is the patient deaf or have difficulty hearing?: No Does the patient have difficulty seeing, even when wearing glasses/contacts?: No Does the patient have difficulty concentrating, remembering, or making decisions?: No Patient able to express need for assistance with ADLs?: Yes Does the patient have difficulty dressing or bathing?: No Independently performs ADLs?: Yes (appropriate for developmental age) Does the patient have difficulty walking or climbing stairs?: No Weakness of Legs: None Weakness of Arms/Hands: None  Permission Sought/Granted  Emotional Assessment Appearance:: Appears stated age Attitude/Demeanor/Rapport: Gracious Affect (typically observed): Accepting Orientation: : Oriented to Self, Oriented to Place, Oriented to  Time, Oriented to Situation   Psych Involvement: No (comment)  Admission diagnosis:  incisional hernia Patient Active  Problem List   Diagnosis Date Noted  . Incisional hernia, without obstruction or gangrene 05/09/2019  . Uncontrolled type 2 diabetes mellitus with hyperglycemia, with long-term current use of insulin ([place]) 04/12/2017  . Essential hypertension 04/12/2017  . Morbid (severe) obesity due to excess calories ([place]) 10/18/2016  . 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 Iron deficiency anemia 09/02/2015  . Asthma 08/28/2015  . Type 2 diabetes mellitus ([place]) 07/31/2015  . Hyperlipidemia 12/04/2013  . Numbness and tingling in right hand 12/04/2013  . Smoking 12/04/2013  . Elevated BP 12/04/2013  . Pap smear, high-risk (screening, no prior abnormality) 02/13/2013  . Exposure to STD 02/13/2013   PCP:  Kerin Perna, NP Pharmacy:   [facility], [address] [place] [address] Phone: [phone] Fax: [facility]), [place] [id number]  ([place]) [place] [address] Phone: [phone] Fax: [phone]     Social Determinants of Health (SDOH) Interventions    Readmission Risk Interventions No flowsheet data found.

## 2019-05-14 NOTE — Progress Notes (Signed)
Pt found on RA with Sat 80%. Pt stated that since Oxygen removed she has had a Headache. Pt placed on 3L O2 with sats stabilized in low 90's, RN aware.

## 2019-05-14 NOTE — Progress Notes (Signed)
SATURATION QUALIFICATIONS: (This note is used to comply with regulatory documentation for home oxygen)  Patient Saturations on Room Air at Rest = 91%  Patient Saturations on Room Air while Ambulating = 88%  Patient Saturations on 2 Liters of oxygen while Ambulating = 93%  Please briefly explain why patient needs home oxygen: 

## 2019-05-14 NOTE — Progress Notes (Signed)
[place] Hospital Day(s): 5.   Post op day(s): 5 Days Post-Op.   Interval History: Patient seen and examined, no acute events or new complaints overnight. Patient reports some incisional soreness but improving. Biggest issue is headaches which have persisted since yesterday. No fever, chills, nausea, or emesis. She is tolerating diet. Having flatus but no BM. Mobilized with PT but not after that. Still requiring O2. No further issues.   Vital signs in last 24 hours: [min-max] current  Temp:  [97.6 F (36.4 C)-98.2 F (36.8 C)] 98.2 F (36.8 C) (11/10 0334) Pulse Rate:  [68-87] 70 (11/10 0737) Resp:  [18-20] 20 (11/10 0737) BP: (116-150)/(63-77) 126/71 (11/10 0334) SpO2:  [80 %-95 %] 91 % (11/10 0737)     Height: 5\' 5"  (165.1 cm) Weight: 115.9 kg BMI (Calculated): 42.53   Intake/Output last 2 shifts:  11/09 0701 - 11/10 0700 In: 420 [P.O.:420] Out: 3332 [Urine:3250; Drains:82]   Physical Exam:  Constitutional: alert, cooperative and no distress  Respiratory: breathing non-labored at rest, on  Cardiovascular: regular rate and sinus rhythm  Gastrointestinal: soft,incisional soreness, and non-distended, JP in right and left abdomen both with serosanguinous output Integumentary:Midline laparotomy with Prevena in place, good seal  Labs:  CBC Latest Ref Rng & Units 05/10/2019 03/26/2019 12/04/2018  WBC 4.0 - 10.5 K/uL 12.6(H) 8.6 7.2  Hemoglobin 12.0 - 15.0 g/dL 11.3(L) 13.1 10.1(L)  Hematocrit 36.0 - 46.0 % 36.6 42.4 34.0  Platelets 150 - 400 K/uL 229 289 379   CMP Latest Ref Rng & Units 05/10/2019 03/26/2019 03/12/2019  Glucose 70 - 99 mg/dL 218(H) 65 -  BUN 6 - 20 mg/dL 12 12 13   Creatinine 0.44 - 1.00 mg/dL 0.84 0.68 0.67  Sodium 135 - 145 mmol/L 140 141 -  Potassium 3.5 - 5.1 mmol/L 4.7 4.2 -  Chloride 98 - 111 mmol/L 111 101 -  CO2 22 - 32 mmol/L 22 23 -  Calcium 8.9 - 10.3 mg/dL 7.1(L) 9.1 -  Total Protein 6.0 - 8.5 g/dL - 7.1 -   Total Bilirubin 0.0 - 1.2 mg/dL - <0.2 -  Alkaline Phos 39 - 117 IU/L - 87 -  AST 0 - 40 IU/L - 31 -  ALT 0 - 32 IU/L - 43(H) -     Imaging studies: No new pertinent imaging studies   Assessment/Plan:  43 y.o. female with gradual improvement 5 Days Post-Op s/p abdominal wall reconstruction with meshfor incisional hernia.              - Continue diabetic diet - pain control prn + Imitrex once for headaches; antiemetics prn - monitor abdominal examination; on-going bowel function - mobilization encouraged; will engage PT - wean to room airif feasible; she did require home O2 after her last surgery - will ambulate to determine need for home O2 - medical management of comorbidities; add nicotine patch  All of the above findings and recommendations were discussed with the patient, and the medical team, and all of patient's questions were answered to her expressed satisfaction.  -- Edison Simon, PA-C [facility] 05/14/2019, 8:34 AM [phone] M-F: 7am - 4pm

## 2019-05-15 LAB — GLUCOSE, CAPILLARY
Glucose-Capillary: 164 mg/dL — ABNORMAL HIGH (ref 70–99)
Glucose-Capillary: 160 mg/dL — ABNORMAL HIGH (ref 70–99)

## 2019-05-15 MED ORDER — CYCLOBENZAPRINE HCL 5 MG PO TABS: 5.0000 mg | ORAL_TABLET | Freq: Three times a day (TID) | ORAL | 0 refills | Status: DC

## 2019-05-15 MED ORDER — HYDROMORPHONE HCL 1 MG/ML IJ SOLN
0.5000 mg | Freq: Once | INTRAMUSCULAR | Status: AC
  Administered 2019-05-15: 04:00:00 0.5 mg via INTRAVENOUS

## 2019-05-15 MED ORDER — HYDROMORPHONE HCL 1 MG/ML IJ SOLN: 0.5000 mg | INTRAMUSCULAR | Status: DC | PRN

## 2019-05-15 MED ORDER — IBUPROFEN 800 MG PO TABS: 800.0000 mg | ORAL_TABLET | Freq: Three times a day (TID) | ORAL | 0 refills | Status: DC | PRN

## 2019-05-15 MED ORDER — NICOTINE 21 MG/24HR TD PT24: 21.0000 mg | MEDICATED_PATCH | Freq: Every day | TRANSDERMAL | 0 refills | Status: DC

## 2019-05-15 MED ORDER — DOCUSATE SODIUM 100 MG PO CAPS: 100.0000 mg | ORAL_CAPSULE | Freq: Every day | ORAL | 0 refills | Status: DC

## 2019-05-15 MED ORDER — BISACODYL 5 MG PO TBEC
5.0000 mg | DELAYED_RELEASE_TABLET | Freq: Once | ORAL | Status: AC
  Administered 2019-05-15: 10:00:00 5 mg via ORAL
  Filled 2019-05-15: qty 1

## 2019-05-15 MED ORDER — POLYETHYLENE GLYCOL 3350 17 G PO PACK: 17.0000 g | PACK | Freq: Every day | ORAL | 0 refills | Status: DC

## 2019-05-15 MED ORDER — HYDROMORPHONE HCL 2 MG PO TABS: 2.0000 mg | ORAL_TABLET | Freq: Four times a day (QID) | ORAL | 0 refills | Status: DC | PRN

## 2019-05-15 MED ORDER — HYDROMORPHONE HCL 1 MG/ML IJ SOLN
INTRAMUSCULAR | Status: AC
  Administered 2019-05-15: 0.5 mg via INTRAVENOUS
  Filled 2019-05-15: qty 1

## 2019-05-15 NOTE — TOC Transition Note (Signed)
Transition of Care [facility]) - CM/SW Discharge Note   Patient Details  Name: Julie Robertson MRN: [id number] Date of Birth: 05/07/1976  Transition of Care [facility]) CM/SW Contact:  Beverly Sessions, RN Phone Number: 05/15/2019, 4:43 PM   Clinical Narrative:    Patient to discharge home today Patient to be followed by home health PT.  Per PA, RN not indicated at this time  Helene Kelp with [facility] notified of discharge  RW, BSC, and O2 has been delivered to bedside by [facility] Patient denies issues obtaining her medications at discharge  Wound vac has been removed prior to discharge     Final next level of care: [place] Barriers to Discharge: Barriers Resolved   Patient Goals and CMS Choice   CMS Medicare.gov Compare Post Acute Care list provided to:: Patient Choice offered to / list presented to : Patient  Discharge Placement                       Discharge Plan and Services   Discharge Planning Services: CM Consult            DME Arranged: Oxygen, Walker rolling, 3-N-1 DME Agency: [facility] Date DME Agency Contacted: 05/14/19 Time DME Agency Contacted: [phone] Representative spoke with at DME Agency: [place]: PT [place]: [facility] (formerly [employer]) Date [place]: 05/15/19 Time [place]: 1626 Representative spoke with at [place]: [place] ([place]) Interventions     Readmission Risk Interventions No flowsheet data found.

## 2019-05-15 NOTE — Discharge Summary (Addendum)
[facility] SURGICAL DISCHARGE SUMMARY  Patient ID: Julie Robertson MRN: [id number] DOB/AGE: 06-Dec-1975 43 y.o.  Admit date: 05/09/2019 Discharge date: 05/15/2019  Discharge Diagnoses Patient Active Problem List   Diagnosis Date Noted  . Incisional hernia, without obstruction or gangrene 05/09/2019  . Uncontrolled type 2 diabetes mellitus with hyperglycemia, with long-term current use of insulin ([place]) 04/12/2017  . Essential hypertension 04/12/2017  . Morbid (severe) obesity due to excess calories ([place]) 10/18/2016  . 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 Iron deficiency anemia 09/02/2015  . Asthma 08/28/2015  . Type 2 diabetes mellitus ([place]) 07/31/2015  . Hyperlipidemia 12/04/2013  . Numbness and tingling in right hand 12/04/2013  . Smoking 12/04/2013  . Elevated BP 12/04/2013  . Pap smear, high-risk (screening, no prior abnormality) 02/13/2013  . Exposure to STD 02/13/2013    Consultants None  Procedures 05/09/2019:  Open Incisional Hernia Repair, Right sided transverse abdominus release, left sided retrorectus dissection, mesh placement, wound vac Prevena placement.  HPI: Julie Robertson is a 43 y.o. female presenting for follow-up of her incisional hernias.  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  She was seen on 9/25 after CT scan had been done showing an incisional hernia with multiple hernia defects and Swiss cheese type fashion under her upper midline incision.  Surgery was scheduled for 05/09/2019 she presents today for H&P update.  She denies any new or worsening issues and reports of feeling the palpable hernias in her upper abdominal wall.  Denies any episodes of obstruction or incarceration or strangulation.  Hospital Course: Informed consent was obtained and documented, and patient underwent uneventful abdominal wall reconstruction (Dr Hampton Abbot, 05/09/2019).  Post-operatively, patient's had  issues with pain management and oxygen desaturation. Her pain gradually improved throughout the course of the admission. She did require O2 support throughout which will be documented below. Advancement of patient's diet and ambulation were well-tolerated. PT was following and recommended HHPT and DME equipment. The remainder of patient's hospital course was essentially unremarkable, and discharge planning was initiated accordingly with patient safely able to be discharged home with appropriate discharge instructions, pain control, and outpatient follow-up after all of his questions were answered to her expressed satisfaction.  O2 Requirement: SATURATION QUALIFICATIONS: (This note is used to comply with regulatory documentation for home oxygen)  Patient Saturations on Room Air at Rest = 91%  Patient Saturations on Room Air while Ambulating = 88%  Patient Saturations on 2 Liters of oxygen while Ambulating = 93%  Please briefly explain why patient needs home oxygen: Patient with a history of asthma and history of tobacco abuse whose hypoxia has failed to improve despite albuterol inhaler and nebulizer treatments.    Discharge Condition: Good   Physical Examination:  Constitutional: alert, cooperative and no distress  Respiratory: breathing non-labored at rest, on [place] Cardiovascular: regular rate and sinus rhythm  Gastrointestinal: soft,incisional soreness, and non-distended, JP in right and left abdomen both with serosanguinous output Integumentary:Midline laparotomy CDI with staples, no erythema or drainage    Allergies as of 05/15/2019   No Known Allergies     Medication List    TAKE these medications   albuterol 108 (90 Base) MCG/ACT inhaler Commonly known as: VENTOLIN HFA Inhale 2 puffs into the lungs every 6 (six) hours as needed for wheezing or shortness of breath.   atorvastatin 40 MG tablet Commonly known as: LIPITOR Take 1 tablet (40 mg total) by mouth at  bedtime.   cyclobenzaprine  5 MG tablet Commonly known as: FLEXERIL Take 1 tablet (5 mg total) by mouth 3 (three) times daily.   docusate sodium 100 MG capsule Commonly known as: COLACE Take 1 capsule (100 mg total) by mouth daily. Start taking on: May 16, 2019   gabapentin 300 MG capsule Commonly known as: NEURONTIN Take 1 capsule (300 mg total) by mouth 3 (three) times daily.   gentamicin cream 0.1 % Commonly known as: GARAMYCIN Apply 1 application topically 2 (two) times daily.   glipiZIDE 5 MG tablet Commonly known as: GLUCOTROL Take 1 tablet (5 mg total) by mouth daily before breakfast.   glucose blood test strip Commonly known as: True Metrix Blood Glucose Test Use as instructed   HYDROmorphone 2 MG tablet Commonly known as: DILAUDID Take 1 tablet (2 mg total) by mouth every 6 (six) hours as needed for severe pain (Breakthrough PAIN).   ibuprofen 800 MG tablet Commonly known as: ADVIL Take 1 tablet (800 mg total) by mouth every 8 (eight) hours as needed.   insulin glargine 100 UNIT/ML injection Commonly known as: LANTUS Inject 50 Units into the skin at bedtime.   Insulin Pen Needle 31G X 5 MM Misc Inject 10 units subcutaneous at bedtime   losartan 25 MG tablet Commonly known as: Cozaar Take 1 tablet (25 mg total) by mouth daily.   metFORMIN 500 MG 24 hr tablet Commonly known as: GLUCOPHAGE-XR Take 2 tablets (1,000 mg total) by mouth 2 (two) times daily.   nicotine 21 mg/24hr patch Commonly known as: NICODERM CQ - dosed in mg/24 hours Place 1 patch (21 mg total) onto the skin daily. Start taking on: May 16, 2019   omeprazole 20 MG capsule Commonly known as: PRILOSEC Take 1 capsule (20 mg total) by mouth daily.   polyethylene glycol 17 g packet Commonly known as: MIRALAX / GLYCOLAX Take 17 g by mouth daily. Start taking on: May 16, 2019   terbinafine 250 MG tablet Commonly known as: LamISIL Take 1 tablet (250 mg total) by mouth  daily.   True Metrix Meter w/Device Kit Use as directed   TRUEplus Lancets 28G Misc Use as directed            Durable Medical Equipment  (From admission, onward)         Start     Ordered   05/15/19 1048  For home use only DME Bedside commode  Once    Question:  Patient needs a bedside commode to treat with the following condition  Answer:  Physical deconditioning   05/15/19 1047   05/15/19 1048  For home use only DME Walker rolling  Once    Question:  Patient needs a walker to treat with the following condition  Answer:  Physical deconditioning   05/15/19 1047   05/15/19 1048  For home use only DME oxygen  Once    Question Answer Comment  Length of Need 6 Months   Mode or (Route) Nasal cannula   Liters per Minute 2   Frequency Continuous (stationary and portable oxygen unit needed)   Oxygen conserving device No   Oxygen delivery system Gas      05/15/19 1047           Follow-up Information    Olean Ree, MD. Schedule an appointment as soon as possible for a visit on 05/20/2019.   Specialty: General Surgery Why: s/p abdominal wall reconstruction, has staples, and JP drain x2 Contact information: [address] [place]  [address] [phone]            Time spent on discharge management including discussion of hospital course, clinical condition, outpatient instructions, prescriptions, and follow up with the patient and members of the medical team: >30 minutes  -- Edison Simon , PA-C [facility]  05/15/2019, 2:33 PM [phone] M-F: 7am - 4pm

## 2019-05-15 NOTE — Progress Notes (Signed)
Physical Therapy Treatment Patient Details Name: Julie Robertson MRN: [id number] DOB: 31-Aug-1975 Today's Date: 05/15/2019    History of Present Illness Pt admitted for incisional hernia and is s/p open incisional hernia repair on 05/09/19. 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 Pt is spanish speaking, use of interpreter utilized this date.    PT Comments    Pt is making good progress towards goals with increased ambulation distance this session. All mobility performed on 2L of O2 with sats slightly decreasing to 88%. Pt wished to stop ambulation to take meds. RN student agreeable to continue further laps once meds done. Pt asking about equipment to take home, notified that CM is working out the details. Will continue to progress as able.   Follow Up Recommendations  Home health PT;Supervision for mobility/OOB     Equipment Recommendations  Rolling walker with 5" wheels;3in1 (PT)    Recommendations for Other Services       Precautions / Restrictions Precautions Precautions: None Precaution Comments: use pillow with coughing Restrictions Weight Bearing Restrictions: No    Mobility  Bed Mobility Overal bed mobility: Needs Assistance Bed Mobility: Supine to Sit     Supine to sit: Min guard     General bed mobility comments: safe technique with only single UE needed for assistance  Transfers Overall transfer level: Needs assistance Equipment used: Rolling walker (2 wheeled) Transfers: Sit to/from Stand Sit to Stand: Supervision         General transfer comment: upright posture. Use of RW. Donned bedroom slippers for ambulation. All mobility performed on 2L of O2  Ambulation/Gait Ambulation/Gait assistance: Supervision Gait Distance (Feet): 200 Feet Assistive device: Rolling walker (2 wheeled) Gait Pattern/deviations: Step-through pattern     General Gait Details: ambulated with reciprocal  gait pattern and slightly increased speed. O2 sats at 90% prior to ambulation and decreasing to 88% post ambulation. Able to perform pursed lip breathing at rest with sats quickly improving to 90%. Further ambulation performed with nursing student   Stairs             Wheelchair Mobility    Modified Rankin (Stroke Patients Only)       Balance Overall balance assessment: Needs assistance Sitting-balance support: Feet supported Sitting balance-Leahy Scale: Normal     Standing balance support: Bilateral upper extremity supported Standing balance-Leahy Scale: Good                              Cognition Arousal/Alertness: Awake/alert Behavior During Therapy: WFL for tasks assessed/performed Overall Cognitive Status: Within Functional Limits for tasks assessed                                        Exercises      General Comments        Pertinent Vitals/Pain Pain Assessment: 0-10 Pain Score: 6  Pain Location: abdomen Pain Descriptors / Indicators: Operative site guarding Pain Intervention(s): Limited activity within patient's tolerance    Home Living                      Prior Function            PT Goals (current goals can now be found in the care plan section) Acute Rehab PT Goals Patient Stated Goal: to be  able to perform ADLs independently PT Goal Formulation: With patient Time For Goal Achievement: 05/27/19 Potential to Achieve Goals: Good Progress towards PT goals: Progressing toward goals    Frequency    Min 2X/week      PT Plan Current plan remains appropriate    Co-evaluation              AM-PAC PT "6 Clicks" Mobility   Outcome Measure  Help needed turning from your back to your side while in a flat bed without using bedrails?: None Help needed moving from lying on your back to sitting on the side of a flat bed without using bedrails?: A Little Help needed moving to and from a bed to a chair  (including a wheelchair)?: None Help needed standing up from a chair using your arms (e.g., wheelchair or bedside chair)?: None Help needed to walk in hospital room?: None Help needed climbing 3-5 steps with a railing? : A Little 6 Click Score: 22    End of Session Equipment Utilized During Treatment: Oxygen Activity Tolerance: Patient tolerated treatment well Patient left: in bed(seated on EOB with RN) Nurse Communication: Mobility status PT Visit Diagnosis: Muscle weakness (generalized) (M62.81);Unsteadiness on feet (R26.81);Pain Pain - Right/Left: (abdomen)     Time: 1000-1015 PT Time Calculation (min) (ACUTE ONLY): 15 min  Charges:  $Gait Training: 8-22 mins                     Greggory Stallion, PT, DPT [phone]    Julie Robertson 05/15/2019, 11:17 AM

## 2019-05-15 NOTE — Progress Notes (Signed)
Patient was very hesitant to go home but I explained to her that she will be fine when she gets home and encouraged her to walk and take deep breaths. Hospital interpreter, Ronnald Collum, assisted with interpreting. Discharge instructions reviewed and education was given verbally and visual on drain care and how to empty her drain. All questions were answered. The patient was sent out with oxygen to her husbands waiting ride

## 2019-05-15 NOTE — Progress Notes (Addendum)
Family called back several times asking about her oxygen. "She's run out and doesn't have any at home, the oxygen was supposed to be dropped off within 6 hours of her leaving".  [facility] was noted as the oxygen provider per SW notes; looked up number and family stated that it was not a working number.  AC, Lenord Carbo, notified and was unable to find an after-hours number to call.  This RN verified with family Herbert Pun) that patient's oxygen tank was on zero ("all the way to the left, in the red") and that no equipment was delivered to residence.  Instructed family, that if patient started to not feel well to go the the ED.  Acknowledged. Barbaraann Faster, RN; 11:53 PM; 05/15/2019  Herbert Pun: [phone]

## 2019-05-22 ENCOUNTER — Other Ambulatory Visit: Payer: Self-pay

## 2019-05-22 ENCOUNTER — Encounter: Payer: Self-pay | Admitting: Surgery

## 2019-05-22 ENCOUNTER — Ambulatory Visit (INDEPENDENT_AMBULATORY_CARE_PROVIDER_SITE_OTHER): Payer: Self-pay | Admitting: Surgery

## 2019-05-22 VITALS — BP 142/74 | HR 84 | Temp 97.8°F | Ht 67.0 in | Wt 255.0 lb

## 2019-05-22 DIAGNOSIS — Z09 Encounter for follow-up examination after completed treatment for conditions other than malignant neoplasm: Secondary | ICD-10-CM

## 2019-05-22 DIAGNOSIS — K432 Incisional hernia without obstruction or gangrene: Secondary | ICD-10-CM

## 2019-05-22 NOTE — Progress Notes (Signed)
05/22/2019  HPI: Julie Robertson is a 43 y.o. female s/p abdominal wall reconstruction for incisional hernia with right transversus abdominis release and left retrorectus release on 05/09/2019.  She presents today for follow-up.  She has been doing well with still some abdominal discomfort at the incision but reports otherwise tolerating a diet well with her pain control with her medications having bowel function without any nausea.  She has 2 Blake drains in place which have been draining serosanguineous fluid and she brings her records today of her daily output.  These have both been draining less than 30 cc of fluid each per day.  Vital signs: BP (!) 142/74   Pulse 84   Temp 97.8 F (36.6 C) (Skin)   Ht 5\' 7"  (1.702 m)   Wt 255 lb (115.7 kg)   LMP 05/08/2019   BMI 39.94 kg/m    Physical Exam: Constitutional: No acute distress Abdomen: Soft, obese, nondistended, with appropriate tenderness to palpation at the incision.  Midline incision with staples is healing well is clean dry and intact without any evidence of infection.  She has bilateral Blake drains with serosanguineous fluid.  The left drain is going into the retrorectus space over the mesh and the right drain is going in the subcutaneous space underneath the incision.  Both were removed at bedside today with no complications.  Her staples were also removed today with placement of Steri-Strips along her incision.  Assessment/Plan: This is a 43 y.o. female s/p incisional hernia repair.  -Bilateral Blake drains were removed and staples were DC'd and changed to Steri-Strips today.  She is recovering well at this point. -Reinforced again to continue abstaining from smoking and how this will be very important for her wound healing as well as for future risks of hernias as well as for overall health. -Patient will follow-up in 3 weeks to reassess her progress.  She reports that she may be running out of her pain medications and her  nicotine patch.  She will call us when she needs a refill.  I think that is very appropriate this point given the extent of surgery that she had.   Melvyn Neth, [facility]

## 2019-05-22 NOTE — Patient Instructions (Signed)
Return in three weeks.. 

## 2019-05-23 ENCOUNTER — Telehealth: Payer: Self-pay | Admitting: *Deleted

## 2019-05-23 NOTE — Telephone Encounter (Signed)
Patients daughter called and stated that they just noticed blood at her incision site and its opended up some and it leaked through on her shirt and they want to know what she needs to do.  She had surgery with Dr.Piscoyta on 05/09/19, ventral hernia repair and was seen yesterday in the office and had her drains removed and staples.

## 2019-05-23 NOTE — Telephone Encounter (Signed)
Patient daughter states there is fluid draining from an opened area.staples were removed yesterday.  Denies fever chills. No redness. Steri strips are intact.  Patient advised to monitor for signs or symptoms of infection to call office. Patient requested to be seen 05/24/2019. Patient added to schedule 8:30 am.

## 2019-05-24 ENCOUNTER — Encounter: Payer: Self-pay | Admitting: Surgery

## 2019-05-24 ENCOUNTER — Other Ambulatory Visit: Payer: Self-pay

## 2019-05-24 ENCOUNTER — Ambulatory Visit (INDEPENDENT_AMBULATORY_CARE_PROVIDER_SITE_OTHER): Payer: Self-pay | Admitting: Surgery

## 2019-05-24 VITALS — BP 106/73 | HR 73 | Temp 97.9°F | Resp 14 | Ht 65.0 in | Wt 255.0 lb

## 2019-05-24 DIAGNOSIS — Z09 Encounter for follow-up examination after completed treatment for conditions other than malignant neoplasm: Secondary | ICD-10-CM

## 2019-05-24 DIAGNOSIS — K432 Incisional hernia without obstruction or gangrene: Secondary | ICD-10-CM

## 2019-05-24 NOTE — Patient Instructions (Signed)
Please keep your follow up appointment

## 2019-05-24 NOTE — Progress Notes (Signed)
05/24/2019  HPI: Julie Robertson is a 43 y.o. female s/p incisional hernia repair via right TAR and left retrorectus approach.  Patient was seen two days ago at which time her drains were removed and her staples were removed and changed for steri strips.  She called yesterday because the umbilical portion of her wound opened up, about 1 inch in length and had some fluid drainage.  Denies any worsening pain, redness of the skin, purulent fluid.  Vital signs: BP 106/73   Pulse 73   Temp 97.9 F (36.6 C) (Temporal)   Resp 14   Ht 5\' 5"  (1.651 m)   Wt 255 lb (115.7 kg)   LMP 05/08/2019   SpO2 97%   BMI 42.43 kg/m    Physical Exam: Constitutional: No acute distress Abdomen:  Soft, obese, non-distended, non-tender to palpation.  Midline incision is doing well with steri strips in place, except for a 1 inch segment just lateral to the umbilicus which as opened up for a width of 3 mm and depth of 3 mm.  No purulent fluid and no active drainage at this point.  Superficial 4x4 gauze placed over the wound.  No room for packing.  Drain sites healing well, almost closed.  Assessment/Plan: This is a 44 y.o. female s/p incisional hernia repair, now with a short segment wound dehiscence.  Discussed with the patient that the wound is ok without any sign of infection or active drainage at this point.  Would not be surprised if she has some serous fluid or seroma fluid drainage still.  There is no erythema or induration or tenderness.  Discussed that she can apply dry gauze dressing daily and as needed to the keep the area clean and dry.  It is ok to shower still.  Patient will follow up in 2 weeks as currently scheduled.  Return precautions given.   Melvyn Neth, [facility]

## 2019-05-27 ENCOUNTER — Telehealth: Payer: Self-pay | Admitting: *Deleted

## 2019-05-27 NOTE — Telephone Encounter (Signed)
Patients daughter called and wanted to see about getting Julie Robertson a refill on her medication, Dilaudid, Ibuprofen and Flexeril.   Had surgery on 05/09/19 ventral hernia repair by Dr.Piscoya

## 2019-05-28 ENCOUNTER — Other Ambulatory Visit: Payer: Self-pay | Admitting: Family Medicine

## 2019-05-28 ENCOUNTER — Other Ambulatory Visit: Payer: Self-pay | Admitting: Surgery

## 2019-05-28 DIAGNOSIS — E1165 Type 2 diabetes mellitus with hyperglycemia: Secondary | ICD-10-CM

## 2019-05-28 MED ORDER — HYDROMORPHONE HCL 2 MG PO TABS: 2.0000 mg | ORAL_TABLET | Freq: Four times a day (QID) | ORAL | 0 refills | Status: DC | PRN

## 2019-05-28 MED ORDER — IBUPROFEN 800 MG PO TABS: 800.0000 mg | ORAL_TABLET | Freq: Three times a day (TID) | ORAL | 0 refills | Status: DC | PRN

## 2019-05-28 MED ORDER — CYCLOBENZAPRINE HCL 5 MG PO TABS: 5.0000 mg | ORAL_TABLET | Freq: Three times a day (TID) | ORAL | 0 refills | Status: DC

## 2019-05-28 NOTE — Telephone Encounter (Signed)
Spoke with Dr.Piscoya he will send prescriptions to [facility]. Patient notifed.

## 2019-05-29 ENCOUNTER — Encounter: Payer: Self-pay | Admitting: Surgery

## 2019-06-03 ENCOUNTER — Telehealth: Payer: Self-pay | Admitting: *Deleted

## 2019-06-03 NOTE — Telephone Encounter (Signed)
Julie Robertson spoke with patient. Patient could not provide any information as to who delivered the oxygen due to not being at home.  Patient will call back 06/04/2019.

## 2019-06-03 NOTE — Telephone Encounter (Signed)
Patients daughter called and stated that they she needs an order for someone to come pick up her oxygen, she stated that its been over a week since she has used it. Please call and advise

## 2019-06-05 ENCOUNTER — Other Ambulatory Visit: Payer: Self-pay

## 2019-06-05 ENCOUNTER — Ambulatory Visit (INDEPENDENT_AMBULATORY_CARE_PROVIDER_SITE_OTHER): Payer: Self-pay | Admitting: Surgery

## 2019-06-05 ENCOUNTER — Encounter: Payer: Self-pay | Admitting: Surgery

## 2019-06-05 VITALS — BP 97/64 | HR 82 | Temp 97.1°F | Resp 14 | Ht 65.0 in | Wt 260.0 lb

## 2019-06-05 DIAGNOSIS — K432 Incisional hernia without obstruction or gangrene: Secondary | ICD-10-CM

## 2019-06-05 DIAGNOSIS — Z09 Encounter for follow-up examination after completed treatment for conditions other than malignant neoplasm: Secondary | ICD-10-CM

## 2019-06-05 NOTE — Progress Notes (Signed)
06/05/2019  HPI: Julie Robertson is a 43 y.o. female s/p incisional hernia repair on 11/5.  This required right sided TAR and left sided retrorectus dissection.  Drains were removed on 11/18, and she had a small wound dehiscence on [id number] at the umbilical portion of her midline wound.  She reports she's doing ok but feels that the umbilical wound is not healing well yet and she's worried she's feeling suture poking through the wound.  She also reports still having soreness/tightness and some numbness over the left side of the wound.  Vital signs: BP 97/64   Pulse 82   Temp (!) 97.1 F (36.2 C) (Temporal)   Resp 14   Ht 5\' 5"  (1.651 m)   Wt 260 lb (117.9 kg)   LMP 05/08/2019   SpO2 99%   BMI 43.27 kg/m    Physical Exam: Constitutional: No acute distress Abdomen:  Soft, obese, non-distended, currently with some soreness only to deep palpation.  The midline incision is well healed, with exception of an area of remaining superficial dehiscence at the umbilicus.  There was some fibrinous material which was sharply excised at bedside.  There are no sutures poking through, but I think she feels the small punctate scabs where the staples were located.    Assessment/Plan: This is a 43 y.o. female s/p incisional hernia repair.  Discussed with the patient that the wound overall is small and more superficial compared to last time I saw her.  With the fibrinous material excised, this should continue to improve.  Continue dry gauze dressing daily.  Discussed that the soreness/tightness is still related to the surgery and closure of the fascia.  This will continue to improve.   She still has two more weeks of no heavy lifting or pushing.  She can resume normal activities at that point.  Follow up in one month to make sure everything is fully healed.   Melvyn Neth, [facility]

## 2019-06-05 NOTE — Patient Instructions (Addendum)
Please call our office if you have questions or concerns. You may shower as usual.   Continue with dressing changes daily until wound is healed.     GENERAL POST-OPERATIVE PATIENT INSTRUCTIONS   WOUND CARE INSTRUCTIONS:  Keep a dry clean dressing on the wound if there is drainage. The initial bandage may be removed after 24 hours.  Once the wound has quit draining you may leave it open to air.  If clothing rubs against the wound or causes irritation and the wound is not draining you may cover it with a dry dressing during the daytime.  Try to keep the wound dry and avoid ointments on the wound unless directed to do so.  If the wound becomes bright red and painful or starts to drain infected material that is not clear, please contact your physician immediately.  If the wound is mildly pink and has a thick firm ridge underneath it, this is normal, and is referred to as a healing ridge.  This will resolve over the next 4-6 weeks.  BATHING: You may shower if you have been informed of this by your surgeon. However, Please do not submerge in a tub, hot tub, or pool until incisions are completely sealed or have been told by your surgeon that you may do so.  DIET:  You may eat any foods that you can tolerate.  It is a good idea to eat a high fiber diet and take in plenty of fluids to prevent constipation.  If you do become constipated you may want to take a mild laxative or take ducolax tablets on a daily basis until your bowel habits are regular.  Constipation can be very uncomfortable, along with straining, after recent surgery.  ACTIVITY:  You are encouraged to cough and deep breath or use your incentive spirometer if you were given one, every 15-30 minutes when awake.  This will help prevent respiratory complications and low grade fevers post-operatively if you had a general anesthetic.  You may want to hug a pillow when coughing and sneezing to add additional support to the surgical area, if you had  abdominal or chest surgery, which will decrease pain during these times.  You are encouraged to walk and engage in light activity for the next two weeks.  You should not lift more than 20 pounds, until 06/20/2019  as it could put you at increased risk for complications.  Twenty pounds is roughly equivalent to a plastic bag of groceries. At that time- Listen to your body when lifting, if you have pain when lifting, stop and then try again in a few days. Soreness after doing exercises or activities of daily living is normal as you get back in to your normal routine.  MEDICATIONS:  Try to take narcotic medications and anti-inflammatory medications, such as tylenol, ibuprofen, naprosyn, etc., with food.  This will minimize stomach upset from the medication.  Should you develop nausea and vomiting from the pain medication, or develop a rash, please discontinue the medication and contact your physician.  You should not drive, make important decisions, or operate machinery when taking narcotic pain medication.  SUNBLOCK Use sun block to incision area over the next year if this area will be exposed to sun. This helps decrease scarring and will allow you avoid a permanent darkened area over your incision.  QUESTIONS:  Please feel free to call our office if you have any questions, and we will be glad to assist you. [phone]

## 2019-06-25 ENCOUNTER — Other Ambulatory Visit: Payer: Self-pay

## 2019-06-25 ENCOUNTER — Other Ambulatory Visit: Payer: Self-pay | Admitting: Podiatry

## 2019-06-25 ENCOUNTER — Encounter (INDEPENDENT_AMBULATORY_CARE_PROVIDER_SITE_OTHER): Payer: Self-pay | Admitting: Primary Care

## 2019-06-25 ENCOUNTER — Ambulatory Visit (INDEPENDENT_AMBULATORY_CARE_PROVIDER_SITE_OTHER): Payer: Medicaid Other | Admitting: Primary Care

## 2019-06-25 VITALS — BP 137/87 | HR 108 | Temp 97.8°F | Ht 65.0 in | Wt 255.6 lb

## 2019-06-25 DIAGNOSIS — Z760 Encounter for issue of repeat prescription: Secondary | ICD-10-CM

## 2019-06-25 DIAGNOSIS — Z794 Long term (current) use of insulin: Secondary | ICD-10-CM

## 2019-06-25 DIAGNOSIS — F17210 Nicotine dependence, cigarettes, uncomplicated: Secondary | ICD-10-CM

## 2019-06-25 DIAGNOSIS — E1165 Type 2 diabetes mellitus with hyperglycemia: Secondary | ICD-10-CM

## 2019-06-25 DIAGNOSIS — Z6841 Body Mass Index (BMI) 40.0 and over, adult: Secondary | ICD-10-CM

## 2019-06-25 DIAGNOSIS — E782 Mixed hyperlipidemia: Secondary | ICD-10-CM

## 2019-06-25 LAB — POCT GLYCOSYLATED HEMOGLOBIN (HGB A1C): Hemoglobin A1C: 9.2 % — AB (ref 4.0–5.6)

## 2019-06-25 LAB — GLUCOSE, POCT (MANUAL RESULT ENTRY): POC Glucose: 260 mg/dl — AB (ref 70–99)

## 2019-06-25 MED ORDER — LOSARTAN POTASSIUM 25 MG PO TABS: 25.0000 mg | ORAL_TABLET | Freq: Every day | ORAL | 3 refills | Status: DC

## 2019-06-25 MED ORDER — GLIPIZIDE 10 MG PO TABS: 10.0000 mg | ORAL_TABLET | Freq: Every day | ORAL | 1 refills | Status: DC

## 2019-06-25 MED ORDER — BASAGLAR KWIKPEN 100 UNIT/ML ~~LOC~~ SOPN: 50.0000 [IU] | PEN_INJECTOR | Freq: Every day | SUBCUTANEOUS | 5 refills | Status: DC

## 2019-06-25 MED ORDER — ATORVASTATIN CALCIUM 40 MG PO TABS: 40.0000 mg | ORAL_TABLET | Freq: Every day | ORAL | 3 refills | Status: DC

## 2019-06-25 MED ORDER — GABAPENTIN 300 MG PO CAPS: 300.0000 mg | ORAL_CAPSULE | Freq: Three times a day (TID) | ORAL | 6 refills | Status: DC

## 2019-06-25 MED ORDER — METFORMIN HCL ER 500 MG PO TB24: 1000.0000 mg | ORAL_TABLET | Freq: Two times a day (BID) | ORAL | 3 refills | Status: DC

## 2019-06-25 MED ORDER — INSULIN ASPART 100 UNIT/ML ~~LOC~~ SOLN: 12.0000 [IU] | Freq: Three times a day (TID) | SUBCUTANEOUS | 1 refills | Status: DC

## 2019-06-25 NOTE — Progress Notes (Signed)
Established Patient Office Visit  Subjective:  Patient ID: Julie Robertson, female    DOB: [birth year]  Age: 43 y.o. MRN: [id number]  CC:  Chief Complaint  Patient presents with  . Diabetes    HPI Harjot Zavadil presents for management of diabetes. A1C 9.2 indicating uncontrolled. She does have polyuria, and polydipsia. Blood pressure today 137/87 denies shortness of breath, headaches, chest pain or lower extremity edema. She voices no other concerns   Past Medical History:  Diagnosis Date  . Anxiety   . Asthma   . Diabetes mellitus without complication ([place])   . Diabetic neuropathy ([place]) 12/2013   vs carpal tunnell.  numbness tingling in right fingers. rx with Gabapentin.  Marland Kitchen Dyslipidemia 08/2009  . Dyspnea   . Gall stones 08/2009  . GERD (gastroesophageal reflux disease)   . Obesity    BMI 41, 250# 03/2015    Past Surgical History:  Procedure Laterality Date  . APPLICATION OF WOUND VAC N/A 05/09/2019   Procedure: APPLICATION OF WOUND VAC;  Surgeon: Olean Ree, MD;  Location: [facility];  Service: General;  Laterality: N/A;  [id number]  . CHOLECYSTECTOMY N/A 03/10/2015   Procedure: LAPAROSCOPIC CHOLECYSTECTOMY WITH INTRAOPERATIVE CHOLANGIOGRAM;  Surgeon: Georganna Skeans, MD;  Location: [place];  Service: General;  Laterality: N/A;  . ERCP N/A 03/11/2015   Procedure: ENDOSCOPIC RETROGRADE CHOLANGIOPANCREATOGRAPHY (ERCP);  Surgeon: Milus Banister, MD;  Location: [place];  Service: Endoscopy;  Laterality: N/A;  . ESOPHAGOGASTRODUODENOSCOPY (EGD) WITH PROPOFOL N/A 07/25/2018   Procedure: ESOPHAGOGASTRODUODENOSCOPY (EGD) WITH PROPOFOL;  Surgeon: Rush Landmark Telford Nab., MD;  Location: [facility];  Service: Gastroenterology;  Laterality: N/A;  . INSERTION OF MESH N/A 05/09/2019   Procedure: INSERTION OF MESH;  Surgeon: Olean Ree, MD;  Location: [facility];  Service: General;  Laterality: N/A;  . Transduodenal Ampullectomy  09/2015  . TUBAL LIGATION    . VENTRAL  HERNIA REPAIR N/A 05/09/2019   Procedure: HERNIA REPAIR VENTRAL ADULT with MESH;  Surgeon: Olean Ree, MD;  Location: [facility];  Service: General;  Laterality: N/A;    Family History  Problem Relation Age of Onset  . Diabetes Mother   . Heart disease Mother   . Hypertension Mother   . Diabetes Father   . Bone cancer Maternal Grandfather   . Kidney disease Maternal Uncle   . Other Son        had kidney removed due to gun shot wound    Social History   Socioeconomic History  . Marital status: Married    Spouse name: Not on file  . Number of children: 2  . Years of education: Not on file  . Highest education level: Not on file  Occupational History  . Occupation: house cleaning  Tobacco Use  . Smoking status: Current Every Day Smoker    Packs/day: 1.50    Years: 4.00    Pack years: 6.00    Types: Cigarettes  . Smokeless tobacco: Never Used  Substance and Sexual Activity  . Alcohol use: Yes    Comment: rare  . Drug use: No  . Sexual activity: Not on file  Other Topics Concern  . Not on file  Social History Narrative   Patient has 2 sons one is 35, the other 28 as of 03/2015. Her kids are not the children of her current husband. As of 03/2015 she is not employed outside the home.   Social Determinants of Health   Financial Resource Strain:   . Difficulty of  Paying Living Expenses: Not on file  Food Insecurity:   . Worried About Charity fundraiser in the Last Year: Not on file  . Ran Out of Food in the Last Year: Not on file  Transportation Needs:   . Lack of Transportation (Medical): Not on file  . Lack of Transportation (Non-Medical): Not on file  Physical Activity:   . Days of Exercise per Week: Not on file  . Minutes of Exercise per Session: Not on file  Stress:   . Feeling of Stress : Not on file  Social Connections:   . Frequency of Communication with Friends and Family: Not on file  . Frequency of Social Gatherings with Friends and Family: Not on file  .  Attends Religious Services: Not on file  . Active Member of Clubs or Organizations: Not on file  . Attends Archivist Meetings: Not on file  . Marital Status: Not on file  Intimate Partner Violence:   . Fear of Current or Ex-Partner: Not on file  . Emotionally Abused: Not on file  . Physically Abused: Not on file  . Sexually Abused: Not on file    Outpatient Medications Prior to Visit  Medication Sig Dispense Refill  . albuterol (VENTOLIN HFA) 108 (90 Base) MCG/ACT inhaler Inhale 2 puffs into the lungs every 6 (six) hours as needed for wheezing or shortness of breath. 1 Inhaler 1  . Blood Glucose Monitoring Suppl (TRUE METRIX METER) w/Device KIT Use as directed 1 kit 0  . docusate sodium (COLACE) 100 MG capsule Take 1 capsule (100 mg total) by mouth daily. 10 capsule 0  . glucose blood (TRUE METRIX BLOOD GLUCOSE TEST) test strip Use as instructed 100 each 12  . ibuprofen (ADVIL) 800 MG tablet Take 1 tablet (800 mg total) by mouth every 8 (eight) hours as needed. 30 tablet 0  . Insulin Pen Needle 31G X 5 MM MISC Inject 10 units subcutaneous at bedtime 100 each 0  . omeprazole (PRILOSEC) 20 MG capsule Take 1 capsule (20 mg total) by mouth daily. 30 capsule 3  . terbinafine (LAMISIL) 250 MG tablet Take 1 tablet (250 mg total) by mouth daily. 90 tablet 0  . TRUEPLUS LANCETS 28G MISC Use as directed 100 each 12  . atorvastatin (LIPITOR) 40 MG tablet Take 1 tablet (40 mg total) by mouth at bedtime. 30 tablet 3  . gabapentin (NEURONTIN) 300 MG capsule Take 1 capsule (300 mg total) by mouth 3 (three) times daily. 90 capsule 6  . glipiZIDE (GLUCOTROL) 5 MG tablet Take 1 tablet (5 mg total) by mouth daily before breakfast. 30 tablet 3  . Insulin Glargine (BASAGLAR KWIKPEN) 100 UNIT/ML SOPN INJECT 0.5 MLS (50 UNITS TOTAL) INTO THE SKIN AT BEDTIME. 15 mL 0  . losartan (COZAAR) 25 MG tablet Take 1 tablet (25 mg total) by mouth daily. 30 tablet 3  . metFORMIN (GLUCOPHAGE-XR) 500 MG 24 hr  tablet Take 2 tablets (1,000 mg total) by mouth 2 (two) times daily. 60 tablet 3  . gentamicin cream (GARAMYCIN) 0.1 % Apply 1 application topically 2 (two) times daily. (Patient not taking: Reported on 06/25/2019) 15 g 1  . HYDROmorphone (DILAUDID) 2 MG tablet Take 1 tablet (2 mg total) by mouth every 6 (six) hours as needed for severe pain (Breakthrough PAIN). (Patient not taking: Reported on 06/25/2019) 30 tablet 0  . nicotine (NICODERM CQ - DOSED IN MG/24 HOURS) 21 mg/24hr patch Place 1 patch (21 mg total) onto the skin  daily. (Patient not taking: Reported on 06/25/2019) 14 patch 0  . polyethylene glycol (MIRALAX / GLYCOLAX) 17 g packet Take 17 g by mouth daily. (Patient not taking: Reported on 06/25/2019) 14 each 0  . cyclobenzaprine (FLEXERIL) 5 MG tablet Take 1 tablet (5 mg total) by mouth 3 (three) times daily. 30 tablet 0   No facility-administered medications prior to visit.    No Known Allergies  ROS Review of Systems  Endocrine: Positive for polydipsia, polyphagia and polyuria.  All other systems reviewed and are negative.     Objective:    Physical Exam  Constitutional: She is oriented to person, place, and time. She appears well-developed and well-nourished.  Morbid obese  BMI 43.10  HENT:  Head: Normocephalic.  Cardiovascular: Normal rate and regular rhythm.  Pulmonary/Chest: Effort normal and breath sounds normal.  Abdominal: Soft. Bowel sounds are normal.  Musculoskeletal:        General: Normal range of motion.     Cervical back: Normal range of motion and neck supple.  Neurological: She is oriented to person, place, and time.  Skin: Skin is warm and dry.  Psychiatric: She has a normal mood and affect. Her behavior is normal.    BP 137/87 (BP Location: Right Arm, Patient Position: Sitting, Cuff Size: Normal)   Pulse (!) 108   Temp 97.8 F (36.6 C) (Oral)   Ht 5' 5"  (1.651 m)   Wt 255 lb 9.6 oz (115.9 kg)   LMP 06/04/2019 (Exact Date)   SpO2 98%   BMI  42.53 kg/m  Wt Readings from Last 3 Encounters:  06/25/19 255 lb 9.6 oz (115.9 kg)  06/05/19 260 lb (117.9 kg)  05/24/19 255 lb (115.7 kg)     Health Maintenance Due  Topic Date Due  . OPHTHALMOLOGY EXAM  12/01/1984  . FOOT EXAM  08/25/2017  . PAP SMEAR-Modifier  07/22/2018    There are no preventive care reminders to display for this patient.  Lab Results  Component Value Date   TSH 3.090 03/22/2018   Lab Results  Component Value Date   WBC 12.6 (H) 05/10/2019   HGB 11.3 (L) 05/10/2019   HCT 36.6 05/10/2019   MCV 83.4 05/10/2019   PLT 229 05/10/2019   Lab Results  Component Value Date   NA 140 05/10/2019   K 4.7 05/10/2019   CO2 22 05/10/2019   GLUCOSE 218 (H) 05/10/2019   BUN 12 05/10/2019   CREATININE 0.84 05/10/2019   BILITOT <0.2 03/26/2019   ALKPHOS 87 03/26/2019   AST 31 03/26/2019   ALT 43 (H) 03/26/2019   PROT 7.1 03/26/2019   ALBUMIN 4.1 03/26/2019   CALCIUM 7.1 (L) 05/10/2019   ANIONGAP 7 05/10/2019   Lab Results  Component Value Date   CHOL 141 03/26/2019   Lab Results  Component Value Date   HDL 37 (L) 03/26/2019   Lab Results  Component Value Date   LDLCALC 76 03/26/2019   Lab Results  Component Value Date   TRIG 160 (H) 03/26/2019   Lab Results  Component Value Date   CHOLHDL 3.8 03/26/2019   Lab Results  Component Value Date   HGBA1C 9.2 (A) 06/25/2019      Assessment & Plan:  Maddison was seen today for diabetes.  Diagnoses and all orders for this visit:  Uncontrolled type 2 diabetes mellitus with hyperglycemia, with long-term current use of insulin ([place]) Discussed modifiable and non modifiable risk factors diet -restricting carbohydrates, sweets, breads and rice, weight loss  and medication are all modifiable.  -     HgB A1c -     Glucose (CBG) -     glipiZIDE (GLUCOTROL) 10 MG tablet; Take 1 tablet (10 mg total) by mouth daily before breakfast. -     metFORMIN (GLUCOPHAGE-XR) 500 MG 24 hr tablet; Take 2 tablets (1,000  mg total) by mouth 2 (two) times daily. -     losartan (COZAAR) 25 MG tablet; Take 1 tablet (25 mg total) by mouth daily. -     gabapentin (NEURONTIN) 300 MG capsule; Take 1 capsule (300 mg total) by mouth 3 (three) times daily. -     Insulin Glargine (BASAGLAR KWIKPEN) 100 UNIT/ML SOPN; Inject 0.5 mLs (50 Units total) into the skin at bedtime.  Mixed hyperlipidemia Elevated  cholesterol that can lead to heart attack and stroke. Decrease your fatty foods, red meat, cheese, milk and increase fiber like whole grains and veggies. You can also add a fiber supplement like Metamucil or Benefiber.  Continue atorvastatin 29m at bedtime   -  Morbid (severe) obesity due to excess calories (HCC) Discussed diet and exercise for person with BMI >25. Instructed: You must burn more calories than you eat. Losing 5 percent of your body weight should be considered a success. In the longer term, losing more than 15 percent of your body weight and staying at this weight is an extremely good result. However, keep in mind that even losing 5 percent of your body weight leads to important health benefits, so try not to get discouraged if you're not able to lose more than this. Will recheck weight in 3-6 months.  Other order/Medication refill  atorvastatin (LIPITOR) 40 MG tablet; Take 1 tablet (40 mg total) by mouth at bedtime. -     insulin aspart (NOVOLOG) 100 UNIT/ML injection; Inject 12 Units into the skin 3 (three) times daily before meals. 4 units 200-250, 6 units 251-300, 8 units 301-350 , 10 units 51-400 , 12 units 401-450 (call if bs >450)    Meds ordered this encounter  Medications  . glipiZIDE (GLUCOTROL) 10 MG tablet    Sig: Take 1 tablet (10 mg total) by mouth daily before breakfast.    Dispense:  90 tablet    Refill:  1  . insulin aspart (NOVOLOG) 100 UNIT/ML injection    Sig: Inject 12 Units into the skin 3 (three) times daily before meals. 4 units 200-250, 6 units 251-300, 8 units 301-350 , 10  units 351-400 , 12 units 401-450 (call if bs >450)    Dispense:  30 mL    Refill:  1  . metFORMIN (GLUCOPHAGE-XR) 500 MG 24 hr tablet    Sig: Take 2 tablets (1,000 mg total) by mouth 2 (two) times daily.    Dispense:  60 tablet    Refill:  3  . losartan (COZAAR) 25 MG tablet    Sig: Take 1 tablet (25 mg total) by mouth daily.    Dispense:  30 tablet    Refill:  3  . gabapentin (NEURONTIN) 300 MG capsule    Sig: Take 1 capsule (300 mg total) by mouth 3 (three) times daily.    Dispense:  90 capsule    Refill:  6  . Insulin Glargine (BASAGLAR KWIKPEN) 100 UNIT/ML SOPN    Sig: Inject 0.5 mLs (50 Units total) into the skin at bedtime.    Dispense:  15 mL    Refill:  5  . atorvastatin (LIPITOR) 40 MG tablet  Sig: Take 1 tablet (40 mg total) by mouth at bedtime.    Dispense:  30 tablet    Refill:  3    Follow-up: Return in about 3 months (around 09/23/2019) for DM in person and fasting labs and pap.    Kerin Perna, NP

## 2019-06-25 NOTE — Patient Instructions (Signed)
Hiperglucemia Hyperglycemia La hiperglucemia se produce cuando el nivel de azcar (glucosa) en la sangre es demasiado alto. Puede suceder que no cause sntomas. Si tiene sntomas, estos pueden incluir seales de advertencia, por ejemplo, las siguientes:  Estar ms sediento que lo habitual.  Hambre.  Cansancio.  Necesidad de Garment/textile technologist con mayor frecuencia que lo normal.  Visin borrosa. Si la afeccin empeora, puede presentar otros sntomas, como los siguientes:  Morris.  Falta de hambre (inapetencia).  Aliento con [employer] a fruta.  Debilidad.  Aumento o prdida de peso no planificados. Probablemente pierda de peso muy rpidamente.  Hormigueo o adormecimiento en las manos o los pies.  Dolor de [place].  Cuando se pellizca la piel, esta no vuelve rpidamente a su lugar al soltarla (turgencia cutnea deficiente).  Dolor en el vientre (abdomen).  Cortes o moretones que tardan en curarse. La hiperglucemia puede presentarse tanto en personas que tienen diabetes como en las que no la tienen. Puede desarrollarse despacio o rpidamente y ser una emergencia mdica. Siga estas indicaciones en su casa: Instrucciones generales  [employer] de venta libre y los recetados solamente como se lo haya indicado el mdico.  No consuma productos que contengan nicotina o tabaco, como cigarrillos y Psychologist, sport and exercise. Si necesita ayuda para dejar de fumar, consulte al mdico.  Limite el consumo de alcohol a no ms de 1 medida por da si es mujer y no est Music therapist, y a 2 medidas si es hombre. Una medida equivale a 12oz (310ml) de cerveza, 5oz (156ml) de vino o 1oz (35ml) de bebidas alcohlicas de alta graduacin.  Controle el estrs. Si necesita ayuda para lograrlo, consulte al mdico.  Concurra a todas las visitas de control como se lo haya indicado el mdico. Esto es importante. Comida y bebida   Mantenga un peso saludable.  Haga ejercicios con regularidad como se  lo haya indicado el mdico.  Beba la cantidad suficiente de lquido, especialmente si: ? Realiza actividad fsica. ? Se enferma. ? El clima es caluroso.  Consuma alimentos saludables, por ejemplo: ? Protenas con bajo contenido de grasa [place]). ? Carbohidratos complejos, como panes integrales o arroz integral. ? Lambert Mody y verduras frescas. ? Productos lcteos descremados. ? Grasas saludables.  Beba suficiente lquido como para mantener el pis (orina) claro o de color amarillo plido. Si usted tiene diabetes:   Asegrese de Charity fundraiser de la hiperglucemia.  Siga el plan de control de la diabetes como se lo haya indicado el mdico. Haga lo siguiente: ? Aplquese la insulina y tome los medicamentos como se lo hayan indicado. ? Siga el plan de ejercicio. ? Siga el plan de comidas. Coma a horario. No se saltee comidas. ? Contrlese el nivel de azcar en la sangre con la frecuencia que le hayan indicado. Contrlese antes y despus de hacer actividad fsica. Si hace ejercicio durante ms tiempo o de [employer] de lo habitual, asegrese de Chief Technology Officer su nivel de azcar en la sangre con mayor frecuencia. ? Siga el plan para los das de enfermedad cuando no pueda comer o beber normalmente. Arme este plan de antemano con el mdico.  Comparta su plan de control de la diabetes con sus compaeros de trabajo y de la escuela, y con las otras personas que viven en su hogar.  Contrlese las cetonas en la orina cuando est enfermo y [place] se lo haya indicado el mdico.  Lleve consigo una tarjeta, o use un brazalete o una medalla que indiquen que es diabtico.  Comunquese con un mdico si:  El nivel de azcar en la sangre est por encima de 240mg /dl (13,72mmol/l) durante 2das seguidos.  Tiene problemas para Advertising account executive de azcar en la sangre dentro del intervalo deseado.  El nivel de azcar en la sangre le aumenta a menudo. Solicite ayuda de inmediato si:  Tiene dificultad  para respirar.  Tiene cambios en la manera de sentirse, pensar o actuar (estado mental).  Tiene ganas constantes de vomitar (nuseas).  No puede dejar de vomitar. Estos sntomas pueden Sales executive. No espere hasta que los sntomas desaparezcan. Solicite atencin mdica de inmediato. Comunquese con el servicio de emergencias de su localidad (911 en los [place]). No conduzca por sus propios medios [employer] hospital. Resumen  La hiperglucemia se produce cuando el nivel de azcar (glucosa) en la sangre es demasiado alto.  La hiperglucemia puede presentarse tanto en personas que tienen diabetes como en las que no la tienen.  Asegrese de beber la cantidad suficiente de lquido, de comer alimentos saludables y de hacer actividad fsica con regularidad.  Comunquese con el mdico si tiene problemas para Advertising account executive de azcar en la sangre dentro del intervalo deseado. Esta informacin no tiene Marine scientist el consejo del mdico. Asegrese de hacerle al mdico cualquier pregunta que tenga. Document Released: 07/23/2010 Document Revised: 02/14/2017 Document Reviewed: 02/24/2015 Elsevier Patient Education  2020 [employer].

## 2019-07-08 ENCOUNTER — Encounter: Payer: Self-pay | Admitting: Surgery

## 2019-07-09 ENCOUNTER — Other Ambulatory Visit: Payer: Self-pay

## 2019-07-09 ENCOUNTER — Ambulatory Visit: Payer: Self-pay

## 2019-07-10 ENCOUNTER — Ambulatory Visit (INDEPENDENT_AMBULATORY_CARE_PROVIDER_SITE_OTHER): Payer: Self-pay | Admitting: Surgery

## 2019-07-10 ENCOUNTER — Other Ambulatory Visit: Payer: Self-pay

## 2019-07-10 ENCOUNTER — Encounter: Payer: Self-pay | Admitting: Surgery

## 2019-07-10 VITALS — BP 131/85 | HR 87 | Temp 98.2°F | Ht 65.0 in | Wt 259.0 lb

## 2019-07-10 DIAGNOSIS — K432 Incisional hernia without obstruction or gangrene: Secondary | ICD-10-CM

## 2019-07-10 DIAGNOSIS — Z09 Encounter for follow-up examination after completed treatment for conditions other than malignant neoplasm: Secondary | ICD-10-CM

## 2019-07-10 NOTE — Progress Notes (Signed)
07/10/2019  HPI: Julie Robertson is a 44 y.o. female s/p right transverse abdominus release and left retrorectus release for incisional hernia repair on 05/09/19.  She presents today for follow up.  Was last seen on 12/2.  Since then, patient reports that her midline incision is fully healed.  However, she reports also that she still has some discomfort on both side of the abdomen depending on how she moves as well as some residual numbness over the left abdominal wall, particularly inferiorly.  She feels that sometimes she can feel the mesh depending on how she presses on her abdomen.  She also reports that she started smoking again, about 1-2 per day rather than a pack a day.  Vital signs: BP 131/85   Pulse 87   Temp 98.2 F (36.8 C)   Ht 5\' 5"  (1.651 m)   Wt 117.5 kg   SpO2 97%   BMI 43.10 kg/m    Physical Exam: Constitutional: No acute distress Abdomen:  Soft, obese, non-distended, currently with some soreness to palpation but no worsening tenderness.  Midline incision is well healed, clean, intact, without any evidence of infection or hernia recurrence.    Assessment/Plan: This is a 44 y.o. female s/p incisional hernia repair with abdominal wall reconstruction.  --Discussed with her that some of the symptoms are likely related to the scar tissue that has formed that can cause some pulling or sensation of tightness.  This will improve with time as the scar remodels.  The numbness may improve somewhat, but I do not think will go back to normal sensation.  This is likely as a result of perforating bundles/nerve that had to be divided during the dissection of the tissue planes. --She's now two months out of her surgery.  She may resume normal activities. --Encouraged the patient to again stop smoking, and this will only benefit her in the long term for wound healing and prevent any hernia recurrence. --Follow up prn.   Melvyn Neth, [facility]

## 2019-07-10 NOTE — Patient Instructions (Signed)
Seguimiento segn sea necesario. Llamenos si tiene alguna pregunta.

## 2019-08-06 ENCOUNTER — Other Ambulatory Visit (INDEPENDENT_AMBULATORY_CARE_PROVIDER_SITE_OTHER): Payer: Self-pay | Admitting: Primary Care

## 2019-09-09 ENCOUNTER — Other Ambulatory Visit (INDEPENDENT_AMBULATORY_CARE_PROVIDER_SITE_OTHER): Payer: Self-pay | Admitting: Primary Care

## 2019-09-09 DIAGNOSIS — E1165 Type 2 diabetes mellitus with hyperglycemia: Secondary | ICD-10-CM

## 2019-09-09 DIAGNOSIS — Z794 Long term (current) use of insulin: Secondary | ICD-10-CM

## 2019-09-23 ENCOUNTER — Ambulatory Visit (INDEPENDENT_AMBULATORY_CARE_PROVIDER_SITE_OTHER): Payer: Self-pay | Admitting: Family Medicine

## 2019-11-08 ENCOUNTER — Other Ambulatory Visit (INDEPENDENT_AMBULATORY_CARE_PROVIDER_SITE_OTHER): Payer: Self-pay | Admitting: Primary Care

## 2019-12-04 ENCOUNTER — Other Ambulatory Visit (INDEPENDENT_AMBULATORY_CARE_PROVIDER_SITE_OTHER): Payer: Self-pay | Admitting: Primary Care

## 2019-12-05 ENCOUNTER — Telehealth (INDEPENDENT_AMBULATORY_CARE_PROVIDER_SITE_OTHER): Payer: Self-pay | Admitting: Primary Care

## 2019-12-25 ENCOUNTER — Encounter (HOSPITAL_COMMUNITY): Payer: Self-pay | Admitting: Emergency Medicine

## 2019-12-25 ENCOUNTER — Emergency Department (HOSPITAL_COMMUNITY)
Admission: EM | Admit: 2019-12-25 | Discharge: 2019-12-26 | Disposition: A | Discharge status: Home / Self Care | Payer: Self-pay | Attending: Emergency Medicine | Admitting: Emergency Medicine

## 2019-12-25 ENCOUNTER — Other Ambulatory Visit: Payer: Self-pay

## 2019-12-25 DIAGNOSIS — S00412A Abrasion of left ear, initial encounter: Secondary | ICD-10-CM | POA: Insufficient documentation

## 2019-12-25 DIAGNOSIS — T7840XA Allergy, unspecified, initial encounter: Secondary | ICD-10-CM | POA: Insufficient documentation

## 2019-12-25 DIAGNOSIS — F17210 Nicotine dependence, cigarettes, uncomplicated: Secondary | ICD-10-CM | POA: Insufficient documentation

## 2019-12-25 DIAGNOSIS — I10 Essential (primary) hypertension: Secondary | ICD-10-CM | POA: Insufficient documentation

## 2019-12-25 DIAGNOSIS — Z79899 Other long term (current) drug therapy: Secondary | ICD-10-CM | POA: Insufficient documentation

## 2019-12-25 DIAGNOSIS — E119 Type 2 diabetes mellitus without complications: Secondary | ICD-10-CM | POA: Insufficient documentation

## 2019-12-25 DIAGNOSIS — Y9384 Activity, sleeping: Secondary | ICD-10-CM | POA: Insufficient documentation

## 2019-12-25 DIAGNOSIS — Z794 Long term (current) use of insulin: Secondary | ICD-10-CM | POA: Insufficient documentation

## 2019-12-25 DIAGNOSIS — X58XXXA Exposure to other specified factors, initial encounter: Secondary | ICD-10-CM | POA: Insufficient documentation

## 2019-12-25 DIAGNOSIS — Y92019 Unspecified place in single-family (private) house as the place of occurrence of the external cause: Secondary | ICD-10-CM | POA: Insufficient documentation

## 2019-12-25 DIAGNOSIS — J45909 Unspecified asthma, uncomplicated: Secondary | ICD-10-CM | POA: Insufficient documentation

## 2019-12-25 DIAGNOSIS — Y999 Unspecified external cause status: Secondary | ICD-10-CM | POA: Insufficient documentation

## 2019-12-25 NOTE — ED Triage Notes (Signed)
Patient reports an insect entered her left ear this evening .

## 2019-12-26 LAB — CBG MONITORING, ED: Glucose-Capillary: 284 mg/dL — ABNORMAL HIGH (ref 70–99)

## 2019-12-26 NOTE — Discharge Instructions (Addendum)
You have been treated for a foreign body in your left ear.  I have also treated you for allergies please take these medications daily.  I want you to take Flonase which is a nasal spray, place in nostril and do 1 to 2 sprays in each nostril every day.  I also want you to take Claritin daily as this will help with your allergies.  These medications you can find over-the-counter at your local pharmacy if you have issues please asked the pharmacist for help.  I want you to follow-up with your primary doctor in 1 week's time for reevaluation of your ear as well as your allergies.  I want you to come back to the emergency department if you develop fever, discharge from ear, increased ear pain, decrease in hearing, shortness of breath chest pain as he symptoms require further evaluation.

## 2019-12-26 NOTE — ED Provider Notes (Signed)
[facility] EMERGENCY DEPARTMENT Provider Note   CSN: [id number] Arrival date & time: 12/25/19  2301     History Chief Complaint  Patient presents with  . Insect inside R-ear    Julie Robertson is a 44 y.o. female.  HPI   Patient presents to the emergency department  with chief complaint of bug in left ear.  Patient states while she was laying down she felt a bug crawl into her ear.  She tried to pull the bug out but was unsuccessful, she felt the bug buzzing around.  For the last 2 hours she said she cannot hear it buzzing or feel it moving anymore but feels like there is something wedged in her ear.  Patient denies any sort of alleviating or aggravating factors.  Patient also complains of allergy-like symptoms, postnasal drip, runny nose, itchy eyes and has been unable to obtain any allergy medications for it.  patient has similar medical history of type 2 diabetes controlled with insulin, asthma, allergies, and GERD, she is not on any blood thinners, did not hit her head, does not take drugs or smoke.  Patient denies headache, change in vision, eye discharge, ear pain, cough, chest pain, shortness of breath, nausea, vomiting, urinary symptoms pedal edema.  Past Medical History:  Diagnosis Date  . Anxiety   . Asthma   . Diabetes mellitus without complication ([place])   . Diabetic neuropathy ([place]) 12/2013   vs carpal tunnell.  numbness tingling in right fingers. rx with Gabapentin.  Marland Kitchen Dyslipidemia 08/2009  . Dyspnea   . Gall stones 08/2009  . GERD (gastroesophageal reflux disease)   . Obesity    BMI 41, 250# 03/2015    Patient Active Problem List   Diagnosis Date Noted  . Incisional hernia, without obstruction or gangrene 05/09/2019  . Uncontrolled type 2 diabetes mellitus with hyperglycemia, with long-term current use of insulin ([place]) 04/12/2017  . Essential hypertension 04/12/2017  . Morbid (severe) obesity due to excess calories ([place]) 10/18/2016  .  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 Iron deficiency anemia 09/02/2015  . Asthma 08/28/2015  . Type 2 diabetes mellitus ([place]) 07/31/2015  . Hyperlipidemia 12/04/2013  . Numbness and tingling in right hand 12/04/2013  . Smoking 12/04/2013  . Elevated BP 12/04/2013  . Pap smear, high-risk (screening, no prior abnormality) 02/13/2013  . Exposure to STD 02/13/2013    Past Surgical History:  Procedure Laterality Date  . APPLICATION OF WOUND VAC N/A 05/09/2019   Procedure: APPLICATION OF WOUND VAC;  Surgeon: Olean Ree, MD;  Location: [facility] ORS;  Service: General;  Laterality: N/A;  [id number]  . CHOLECYSTECTOMY N/A 03/10/2015   Procedure: LAPAROSCOPIC CHOLECYSTECTOMY WITH INTRAOPERATIVE CHOLANGIOGRAM;  Surgeon: Georganna Skeans, MD;  Location: [place];  Service: General;  Laterality: N/A;  . ERCP N/A 03/11/2015   Procedure: ENDOSCOPIC RETROGRADE CHOLANGIOPANCREATOGRAPHY (ERCP);  Surgeon: Milus Banister, MD;  Location: [place];  Service: Endoscopy;  Laterality: N/A;  . ESOPHAGOGASTRODUODENOSCOPY (EGD) WITH PROPOFOL N/A 07/25/2018   Procedure: ESOPHAGOGASTRODUODENOSCOPY (EGD) WITH PROPOFOL;  Surgeon: Rush Landmark Telford Nab., MD;  Location: [facility];  Service: Gastroenterology;  Laterality: N/A;  . INSERTION OF MESH N/A 05/09/2019   Procedure: INSERTION OF MESH;  Surgeon: Olean Ree, MD;  Location: [facility] ORS;  Service: General;  Laterality: N/A;  . Transduodenal Ampullectomy  09/2015  . TUBAL LIGATION    . VENTRAL HERNIA REPAIR N/A 05/09/2019   Procedure: HERNIA REPAIR VENTRAL ADULT with MESH;  Surgeon: Olean Ree, MD;  Location: [facility]  ORS;  Service: General;  Laterality: N/A;     OB History   No obstetric history on file.     Family History  Problem Relation Age of Onset  . Diabetes Mother   . Heart disease Mother   . Hypertension Mother   . Diabetes Father   . Bone cancer Maternal Grandfather   . Kidney disease Maternal Uncle   . Other Son        had kidney removed due to gun shot  wound    Social History   Tobacco Use  . Smoking status: Current Every Day Smoker    Packs/day: 1.50    Years: 4.00    Pack years: 6.00    Types: Cigarettes  . Smokeless tobacco: Never Used  Vaping Use  . Vaping Use: Never used  Substance Use Topics  . Alcohol use: Yes    Comment: rare  . Drug use: No    Home Medications Prior to Admission medications   Medication Sig Start Date End Date Taking? Authorizing Provider  albuterol (VENTOLIN HFA) 108 (90 Base) MCG/ACT inhaler Inhale 2 puffs into the lungs every 6 (six) hours as needed for wheezing or shortness of breath. 12/04/18   Kerin Perna, NP  atorvastatin (LIPITOR) 40 MG tablet Take 1 tablet (40 mg total) by mouth at bedtime. 06/25/19   Kerin Perna, NP  Blood Glucose Monitoring Suppl (TRUE METRIX METER) w/Device KIT Use as directed 04/12/17   Alfonse Spruce, FNP  docusate sodium (COLACE) 100 MG capsule Take 1 capsule (100 mg total) by mouth daily. 05/16/19   Tylene Fantasia, PA-C  gabapentin (NEURONTIN) 300 MG capsule Take 1 capsule (300 mg total) by mouth 3 (three) times daily. 06/25/19   Kerin Perna, NP  gentamicin cream (GARAMYCIN) 0.1 % Apply 1 application topically 2 (two) times daily. 04/08/19   Edrick Kins, DPM  glipiZIDE (GLUCOTROL) 10 MG tablet Take 1 tablet (10 mg total) by mouth daily before breakfast. 06/25/19   Kerin Perna, NP  glucose blood (TRUE METRIX BLOOD GLUCOSE TEST) test strip Use as instructed 03/22/18   Fulp, Cammie, MD  HUMALOG 100 UNIT/ML injection INJECT 12 UNITS INTO THE SKIN 3 TIMES DAILY.4 UNITS 200-250,6UNITS251-300,8UNITS 301-350,10 UNITS 035-597,41 UNITS 401-450(CALL IF BS>450) 11/08/19   Kerin Perna, NP  HYDROmorphone (DILAUDID) 2 MG tablet Take 1 tablet (2 mg total) by mouth every 6 (six) hours as needed for severe pain (Breakthrough PAIN). 05/28/19   Olean Ree, MD  ibuprofen (ADVIL) 800 MG tablet Take 1 tablet (800 mg total) by mouth every 8 (eight)  hours as needed. 05/28/19   Piscoya, Jacqulyn Bath, MD  insulin aspart (NOVOLOG) 100 UNIT/ML injection Inject 12 Units into the skin 3 (three) times daily before meals. 4 units 200-250, 6 units 251-300, 8 units 301-350 , 10 units 351-400 , 12 units 401-450 (call if bs >450) 06/25/19   Kerin Perna, NP  Insulin Glargine (BASAGLAR KWIKPEN) 100 UNIT/ML SOPN Inject 0.5 mLs (50 Units total) into the skin at bedtime. 06/25/19   Kerin Perna, NP  Insulin Pen Needle 31G X 5 MM MISC Inject 10 units subcutaneous at bedtime 04/28/16   Tresa Garter, MD  losartan (COZAAR) 25 MG tablet Take 1 tablet (25 mg total) by mouth daily. 06/25/19   Kerin Perna, NP  metFORMIN (GLUCOPHAGE-XR) 500 MG 24 hr tablet TAKE 2 TABLETS (1,000 MG TOTAL) BY MOUTH 2 (TWO) TIMES DAILY. 09/09/19   Kerin Perna, NP  nicotine (NICODERM CQ - DOSED IN MG/24 HOURS) 21 mg/24hr patch Place 1 patch (21 mg total) onto the skin daily. 05/16/19   Tylene Fantasia, PA-C  omeprazole (PRILOSEC) 20 MG capsule TAKE 1 CAPSULE (20 MG TOTAL) BY MOUTH DAILY. 12/04/19   Kerin Perna, NP  polyethylene glycol (MIRALAX / GLYCOLAX) 17 g packet Take 17 g by mouth daily. 05/16/19   Tylene Fantasia, PA-C  terbinafine (LAMISIL) 250 MG tablet Take 1 tablet (250 mg total) by mouth daily. 04/08/19   Edrick Kins, DPM  TRUEPLUS LANCETS 28G MISC Use as directed 03/22/18   Antony Blackbird, MD    Allergies    Patient has no known allergies.  Review of Systems   Review of Systems  Constitutional: Negative for chills and fever.  HENT: Positive for congestion, ear pain, hearing loss, postnasal drip, rhinorrhea and sore throat. Negative for dental problem, ear discharge, sinus pressure, sinus pain, sneezing and trouble swallowing.        Admits to left ear pain.  Eyes: Positive for itching. Negative for visual disturbance.  Respiratory: Negative for shortness of breath.   Cardiovascular: Negative for chest pain.  Gastrointestinal: Negative  for abdominal pain, nausea and vomiting.  Genitourinary: Negative for enuresis, flank pain, genital sores and pelvic pain.  Musculoskeletal: Negative for back pain and joint swelling.  Skin: Negative for rash.  Neurological: Negative for dizziness, light-headedness and headaches.  Hematological: Does not bruise/bleed easily.    Physical Exam Updated Vital Signs BP 126/71 (BP Location: Right Arm)   Pulse 75   Temp 97.8 F (36.6 C) (Oral)   Resp 17   Ht 5' 5"  (1.651 m)   Wt 117.9 kg   LMP 12/22/2019   SpO2 98%   BMI 43.27 kg/m   Physical Exam Vitals and nursing note reviewed.  Constitutional:      General: She is not in acute distress.    Appearance: She is not ill-appearing.  HENT:     Head: Normocephalic and atraumatic.     Comments: Patient's left ear was visualized, slight erythema within the ear canal, a small lifeless bug was seen in her ear with some cerumen around it.    Right Ear: Tympanic membrane, ear canal and external ear normal.     Left Ear: Tympanic membrane and external ear normal. There is impacted cerumen.     Nose: No congestion.     Mouth/Throat:     Mouth: Mucous membranes are moist.     Pharynx: Oropharynx is clear. No oropharyngeal exudate or posterior oropharyngeal erythema.  Eyes:     General: No scleral icterus.       Right eye: No discharge.        Left eye: No discharge.     Pupils: Pupils are equal, round, and reactive to light.  Cardiovascular:     Rate and Rhythm: Normal rate and regular rhythm.     Pulses: Normal pulses.     Heart sounds: No murmur heard.  No friction rub. No gallop.   Pulmonary:     Effort: No respiratory distress.     Breath sounds: No wheezing, rhonchi or rales.  Abdominal:     General: There is no distension.     Palpations: Abdomen is soft.     Tenderness: There is no abdominal tenderness. There is no guarding.  Musculoskeletal:        General: No swelling.  Skin:    General: Skin is warm and dry.  Capillary Refill: Capillary refill takes less than 2 seconds.     Findings: No rash.  Neurological:     Mental Status: She is alert.  Psychiatric:        Mood and Affect: Mood normal.     ED Results / Procedures / Treatments   Labs (all labs ordered are listed, but only abnormal results are displayed) Labs Reviewed  CBG MONITORING, ED - Abnormal; Notable for the following components:      Result Value   Glucose-Capillary 284 (*)    All other components within normal limits    EKG None  Radiology No results found.  Procedures .Ear Cerumen Removal  Date/Time: 12/26/2019 9:54 AM Performed by: Marcello Fennel, PA-C Authorized by: Marcello Fennel, PA-C   Consent:    Consent obtained:  Verbal   Consent given by:  Patient   Risks discussed:  Bleeding, dizziness, infection, incomplete removal, TM perforation and pain   Alternatives discussed:  No treatment Procedure details:    Location:  L ear   Procedure type: irrigation   Post-procedure details:    Inspection:  TM intact   Hearing quality:  Improved   Patient tolerance of procedure:  Tolerated well, no immediate complications Comments:     A small bug was removed from the patient's left ear.  TMs were evaluated and were left intact, slight erythema around her ear canal, no discharge or drainage noted patient tolerated the procedure well with improved hearing.   (including critical care time)  Medications Ordered in ED Medications - No data to display  ED Course  I have reviewed the triage vital signs and the nursing notes.  Pertinent labs & imaging results that were available during my care of the patient were reviewed by me and considered in my medical decision making (see chart for details).    MDM Rules/Calculators/A&P                          I have personally reviewed all imaging, labs and have interpreted them.  Due to patient presentation concern for cerumen impaction versus foreign body versus  perforated eardrum.  After evaluating patient's ear, a small plug was seen about midway into the ear canal.  There was slight erythema with in the ear canal, with cerumen.  Using a curette as well as a flush the bug was removed.  Ear canal was visualized after bug removal, TMs were intact, it was nonerythematous, ear canal was visualized had slight erythema inside,  no drainage or discharge noted.  Patient tolerated the procedure well and had improved hearing.  Patient also admits to some itchy eyes as well as runny nose and states it feels like allergies.  Unlikely the patient is suffering from strep or a tonsil abscess as throat was visualized no exudates, no erythema, uvula was midline tongue was midline, was not complaining of any trouble swallowing.  Patient will be placed on allergy meds and Flonase for allergy control.  Patient appears to be resting comfortably in bed, show no signs of acute distress.  Vitals have remained stable, she does not meet criteria to be admitted to the hospital.  Likely that patient had a small bug in her left ear which was removed.  Likely patient is also suffering from allergies we will give her over-the-counter allergy medication and instructed to follow-up with her primary care doctor in 1 week.  Patient was discussed with attending who agrees with assessment  and plan.  Patient was explained the results and plan, she verbalized that she understood and agrees with the plan. Final Clinical Impression(s) / ED Diagnoses Final diagnoses:  Ear abrasion, left, initial encounter  Allergy, initial encounter    Rx / DC Orders ED Discharge Orders    None       Marcello Fennel, PA-C 12/26/19 1132    Tegeler, Gwenyth Allegra, MD 12/26/19 [id number]

## 2020-01-02 ENCOUNTER — Telehealth (INDEPENDENT_AMBULATORY_CARE_PROVIDER_SITE_OTHER): Payer: Self-pay | Admitting: Primary Care

## 2020-01-02 ENCOUNTER — Other Ambulatory Visit (INDEPENDENT_AMBULATORY_CARE_PROVIDER_SITE_OTHER): Payer: Self-pay | Admitting: Primary Care

## 2020-01-02 DIAGNOSIS — Z794 Long term (current) use of insulin: Secondary | ICD-10-CM

## 2020-01-02 NOTE — Telephone Encounter (Signed)
Requested medication (s) are due for refill today: no  Requested medication (s) are on the active medication list: yes  Last refill:  12/04/2019  Future visit scheduled: no  Notes to clinic: patient is overdue for follow up Review for refils   Requested Prescriptions  Pending Prescriptions Disp Refills   metFORMIN (GLUCOPHAGE-XR) 500 MG 24 hr tablet [Pharmacy Med Name: metFORMIN HCL ER 500 MG TB2 500 Tablet] 120 tablet 3    Sig: TAKE 2 TABLETS (1,000 MG TOTAL) BY MOUTH 2 (TWO) TIMES DAILY.      Endocrinology:  Diabetes - Biguanides Failed - 01/02/2020  1:00 PM      Failed - HBA1C is between 0 and 7.9 and within 180 days    Hemoglobin A1C  Date Value Ref Range Status  06/25/2019 9.2 (A) 4.0 - 5.6 % Final   HbA1c, POC (prediabetic range)  Date Value Ref Range Status  03/22/2018 10.9 (A) 5.7 - 6.4 % Final   HbA1c, POC (controlled diabetic range)  Date Value Ref Range Status  03/22/2018 10.9 (A) 0.0 - 7.0 % Final   HbA1c POC (<> result, manual entry)  Date Value Ref Range Status  03/22/2018 10.9 4.0 - 5.6 % Final   Hgb A1c MFr Bld  Date Value Ref Range Status  12/04/2018 10.2 (H) 4.8 - 5.6 % Final    Comment:             Prediabetes: 5.7 - 6.4          Diabetes: >6.4          Glycemic control for adults with diabetes: <7.0           Failed - Valid encounter within last 6 months    Recent Outpatient Visits           6 months ago Uncontrolled type 2 diabetes mellitus with hyperglycemia, with long-term current use of insulin ([place])   [place], Michelle P, NP   9 months ago Uncontrolled type 2 diabetes mellitus with hyperglycemia, with long-term current use of insulin ([place])   [place], Michelle P, NP   10 months ago Uncontrolled type 2 diabetes mellitus with hyperglycemia, with long-term current use of insulin ([place])   [place] Kerin Perna, NP   1 year ago Uncontrolled type  2 diabetes mellitus with hyperglycemia, with long-term current use of insulin ([place])   [place] Kerin Perna, NP   1 year ago Uncontrolled type 2 diabetes mellitus with hyperglycemia, with long-term current use of insulin ([place])   [place], [place], NP              Passed - Cr in normal range and within 360 days    Creat  Date Value Ref Range Status  08/25/2016 0.69 0.50 - 1.10 mg/dL Final   Creatinine, Ser  Date Value Ref Range Status  05/10/2019 0.84 0.44 - 1.00 mg/dL Final   Creatinine, Urine  Date Value Ref Range Status  08/25/2016 208 20 - 320 mg/dL Final          Passed - eGFR in normal range and within 360 days    GFR, Est African American  Date Value Ref Range Status  08/25/2016 >89 >=60 mL/min Final   GFR calc Af Amer  Date Value Ref Range Status  05/10/2019 >60 >60 mL/min Final   GFR, Est Non African  American  Date Value Ref Range Status  08/25/2016 >89 >=60 mL/min Final   GFR calc non Af Amer  Date Value Ref Range Status  05/10/2019 >60 >60 mL/min Final            Insulin Glargine (BASAGLAR KWIKPEN) 100 UNIT/ML [Pharmacy Med Name: BASAGLAR 100 UNIT/ML KWIKPE 100 Solution Pen-injector] 15 mL 5    Sig: Inject 0.5 mLs (50 Units total) into the skin at bedtime.      Endocrinology:  Diabetes - Insulins Failed - 01/02/2020  1:00 PM      Failed - HBA1C is between 0 and 7.9 and within 180 days    Hemoglobin A1C  Date Value Ref Range Status  06/25/2019 9.2 (A) 4.0 - 5.6 % Final   HbA1c, POC (prediabetic range)  Date Value Ref Range Status  03/22/2018 10.9 (A) 5.7 - 6.4 % Final   HbA1c, POC (controlled diabetic range)  Date Value Ref Range Status  03/22/2018 10.9 (A) 0.0 - 7.0 % Final   HbA1c POC (<> result, manual entry)  Date Value Ref Range Status  03/22/2018 10.9 4.0 - 5.6 % Final   Hgb A1c MFr Bld  Date Value Ref Range Status  12/04/2018 10.2 (H) 4.8 - 5.6 % Final     Comment:             Prediabetes: 5.7 - 6.4          Diabetes: >6.4          Glycemic control for adults with diabetes: <7.0           Failed - Valid encounter within last 6 months    Recent Outpatient Visits           6 months ago Uncontrolled type 2 diabetes mellitus with hyperglycemia, with long-term current use of insulin ([place])   [place] [facility] Juluis Mire P, NP   9 months ago Uncontrolled type 2 diabetes mellitus with hyperglycemia, with long-term current use of insulin ([place])   [place] [facility] Juluis Mire P, NP   10 months ago Uncontrolled type 2 diabetes mellitus with hyperglycemia, with long-term current use of insulin ([place])   [place] Kerin Perna, NP   1 year ago Uncontrolled type 2 diabetes mellitus with hyperglycemia, with long-term current use of insulin ([place])   [place] [facility] Kerin Perna, NP   1 year ago Uncontrolled type 2 diabetes mellitus with hyperglycemia, with long-term current use of insulin ([place])   [place], [place] P, NP                glipiZIDE (GLUCOTROL) 5 MG tablet [Pharmacy Med Name: GLIPIZIDE 5MG TABLET 5 Tablet] 30 tablet 3    Sig: Take 1 tablet (5 mg total) by mouth daily before breakfast.      Endocrinology:  Diabetes - Sulfonylureas Failed - 01/02/2020  1:00 PM      Failed - HBA1C is between 0 and 7.9 and within 180 days    Hemoglobin A1C  Date Value Ref Range Status  06/25/2019 9.2 (A) 4.0 - 5.6 % Final   HbA1c, POC (prediabetic range)  Date Value Ref Range Status  03/22/2018 10.9 (A) 5.7 - 6.4 % Final   HbA1c, POC (controlled diabetic range)  Date Value Ref Range Status  03/22/2018 10.9 (A) 0.0 - 7.0 % Final   HbA1c POC (<> result, manual entry)  Date Value  Ref Range Status  03/22/2018 10.9 4.0 - 5.6 % Final   Hgb A1c MFr Bld  Date Value Ref Range Status  12/04/2018 10.2 (H) 4.8 -  5.6 % Final    Comment:             Prediabetes: 5.7 - 6.4          Diabetes: >6.4          Glycemic control for adults with diabetes: <7.0           Failed - Valid encounter within last 6 months    Recent Outpatient Visits           6 months ago Uncontrolled type 2 diabetes mellitus with hyperglycemia, with long-term current use of insulin ([place])   [place] [facility] Juluis Mire P, NP   9 months ago Uncontrolled type 2 diabetes mellitus with hyperglycemia, with long-term current use of insulin ([place])   [place], Michelle P, NP   10 months ago Uncontrolled type 2 diabetes mellitus with hyperglycemia, with long-term current use of insulin ([place])   [place] Kerin Perna, NP   1 year ago Uncontrolled type 2 diabetes mellitus with hyperglycemia, with long-term current use of insulin ([place])   [place] [facility] Kerin Perna, NP   1 year ago Uncontrolled type 2 diabetes mellitus with hyperglycemia, with long-term current use of insulin [facility])   [place], [place] Cage, NP

## 2020-01-02 NOTE — Telephone Encounter (Signed)
Patient returned call and scheduled for 01/21/2020 with PCP. Patient did inquire about [employer]" program would like to know if she is eligible, please advise directly

## 2020-01-02 NOTE — Telephone Encounter (Signed)
Using [employer] [phone] I called and left a voice mail for her to call the office and make an appt.  I gave her a 30 day courtesy refill.

## 2020-01-09 NOTE — Telephone Encounter (Signed)
Patient made an appt for financial on 01-10-20. Patient wanted to know if her cafa was expired. Patient was inform.

## 2020-01-09 NOTE — Telephone Encounter (Signed)
Could you call patient and provide information for financial assistance.

## 2020-01-10 ENCOUNTER — Other Ambulatory Visit: Payer: Self-pay

## 2020-01-10 ENCOUNTER — Ambulatory Visit: Payer: Medicaid Other

## 2020-01-10 ENCOUNTER — Other Ambulatory Visit (INDEPENDENT_AMBULATORY_CARE_PROVIDER_SITE_OTHER): Payer: Self-pay | Admitting: Primary Care

## 2020-01-10 ENCOUNTER — Ambulatory Visit (INDEPENDENT_AMBULATORY_CARE_PROVIDER_SITE_OTHER): Payer: Self-pay

## 2020-01-10 DIAGNOSIS — Z794 Long term (current) use of insulin: Secondary | ICD-10-CM

## 2020-01-10 DIAGNOSIS — E1165 Type 2 diabetes mellitus with hyperglycemia: Secondary | ICD-10-CM

## 2020-01-10 MED ORDER — BASAGLAR KWIKPEN 100 UNIT/ML ~~LOC~~ SOPN: 50.0000 [IU] | PEN_INJECTOR | Freq: Every day | SUBCUTANEOUS | 3 refills | Status: DC

## 2020-01-10 MED ORDER — GLIPIZIDE 10 MG PO TABS: 10.0000 mg | ORAL_TABLET | Freq: Every day | ORAL | 0 refills | Status: DC

## 2020-01-10 MED ORDER — METFORMIN HCL ER 500 MG PO TB24: 1000.0000 mg | ORAL_TABLET | Freq: Two times a day (BID) | ORAL | 1 refills | Status: DC

## 2020-01-10 MED ORDER — METFORMIN HCL ER 500 MG PO TB24: 1000.0000 mg | ORAL_TABLET | Freq: Two times a day (BID) | ORAL | 0 refills | Status: DC

## 2020-01-10 MED ORDER — BASAGLAR KWIKPEN 100 UNIT/ML ~~LOC~~ SOPN: 50.0000 [IU] | PEN_INJECTOR | Freq: Every day | SUBCUTANEOUS | 0 refills | Status: DC

## 2020-01-10 MED ORDER — GLIPIZIDE 10 MG PO TABS: 10.0000 mg | ORAL_TABLET | Freq: Every day | ORAL | 1 refills | Status: DC

## 2020-01-10 NOTE — Telephone Encounter (Signed)
Patient called and states that she has been out of her medications for BS 1 week.  She states that her BS is now 479 She takes insulin and two oral medications.She is requesting refills of all. Her symptom is that she is very thirsty. Per protocol patient will go to UC. For evaluation. Call was place to [facility] not [facility]. They had no availability and states [facility] was closed.Patient agrees to plan.  Reason for Disposition . Blood glucose > 400 mg/dL (22.2 mmol/L)  Answer Assessment - Initial Assessment Questions 1. BLOOD GLUCOSE: "What is your blood glucose level?"      479 this am 2. ONSET: "When did you check the blood glucose?"     1 week without medication 3. USUAL RANGE: "What is your glucose level usually?" (e.g., usual fasting morning value, usual evening value)     lower 4. KETONES: "Do you check for ketones (urine or blood test strips)?" If yes, ask: "What does the test show now?"      no 5. TYPE 1 or 2:  "Do you know what type of diabetes you have?"  (e.g., Type 1, Type 2, Gestational; doesn't know)      unsure 6. INSULIN: "Do you take insulin?" "What type of insulin(s) do you use? What is the mode of delivery? (syringe, pen; injection or pump)?"     pen 7. DIABETES PILLS: "Do you take any pills for your diabetes?" If yes, ask: "Have you missed taking any pills recently?"    Metformin 1000 and one more 8. OTHER SYMPTOMS: "Do you have any symptoms?" (e.g., fever, frequent urination, difficulty breathing, dizziness, weakness, vomiting)     thursty 9. PREGNANCY: "Is there any chance you are pregnant?" "When was your last menstrual period?"    No come in 1 week  Protocols used: DIABETES - HIGH BLOOD SUGAR-A-AH

## 2020-01-13 MED FILL — BASAGLAR 100 UNIT/ML KWIKPE: 100 | 30 days supply | Qty: 15 | Fill #0

## 2020-01-13 MED FILL — glipiZIDE 10 MG TABS: 10 | 30 days supply | Qty: 30 | Fill #0

## 2020-01-21 ENCOUNTER — Encounter (INDEPENDENT_AMBULATORY_CARE_PROVIDER_SITE_OTHER): Payer: Self-pay | Admitting: Primary Care

## 2020-01-21 ENCOUNTER — Other Ambulatory Visit: Payer: Self-pay | Admitting: Pharmacist

## 2020-01-21 ENCOUNTER — Other Ambulatory Visit: Payer: Self-pay

## 2020-01-21 ENCOUNTER — Ambulatory Visit (INDEPENDENT_AMBULATORY_CARE_PROVIDER_SITE_OTHER): Payer: Self-pay | Admitting: Primary Care

## 2020-01-21 ENCOUNTER — Other Ambulatory Visit (INDEPENDENT_AMBULATORY_CARE_PROVIDER_SITE_OTHER): Payer: Self-pay | Admitting: Primary Care

## 2020-01-21 VITALS — BP 146/76 | HR 86 | Ht 65.0 in | Wt 268.0 lb

## 2020-01-21 DIAGNOSIS — E1165 Type 2 diabetes mellitus with hyperglycemia: Secondary | ICD-10-CM

## 2020-01-21 DIAGNOSIS — Z794 Long term (current) use of insulin: Secondary | ICD-10-CM

## 2020-01-21 DIAGNOSIS — I10 Essential (primary) hypertension: Secondary | ICD-10-CM

## 2020-01-21 DIAGNOSIS — E782 Mixed hyperlipidemia: Secondary | ICD-10-CM

## 2020-01-21 DIAGNOSIS — Z760 Encounter for issue of repeat prescription: Secondary | ICD-10-CM

## 2020-01-21 LAB — POCT GLYCOSYLATED HEMOGLOBIN (HGB A1C): Hemoglobin A1C: 11.8 % — AB (ref 4.0–5.6)

## 2020-01-21 LAB — GLUCOSE, POCT (MANUAL RESULT ENTRY): POC Glucose: 233 mg/dl — AB (ref 70–99)

## 2020-01-21 MED ORDER — METFORMIN HCL ER 500 MG PO TB24: 1000.0000 mg | ORAL_TABLET | Freq: Two times a day (BID) | ORAL | 1 refills | Status: DC

## 2020-01-21 MED ORDER — INSULIN LISPRO (1 UNIT DIAL) 100 UNIT/ML (KWIKPEN): PEN_INJECTOR | SUBCUTANEOUS | 11 refills | Status: DC

## 2020-01-21 MED ORDER — LOSARTAN POTASSIUM 25 MG PO TABS: 25.0000 mg | ORAL_TABLET | Freq: Every day | ORAL | 3 refills | Status: DC

## 2020-01-21 MED ORDER — INSULIN LISPRO 100 UNIT/ML ~~LOC~~ SOLN: SUBCUTANEOUS | 0 refills | Status: DC

## 2020-01-21 MED ORDER — ATORVASTATIN CALCIUM 40 MG PO TABS
40.0000 mg | ORAL_TABLET | Freq: Every day | ORAL | 3 refills | Status: DC
Start: 2020-01-21 — End: 2020-05-26

## 2020-01-21 MED FILL — ?HUMALOG 100 UNITS/ML KWIKP: 100 | 32 days supply | Qty: 12 | Fill #0

## 2020-01-21 MED FILL — metFORMIN HCL ER 500 MG TB2: 500 | 30 days supply | Qty: 120 | Fill #0

## 2020-01-21 MED FILL — ?ATORVASTATIN 40MG TABLET: 40 | 30 days supply | Qty: 30 | Fill #0

## 2020-01-21 MED FILL — ?BASAGLAR 100 UNITS/ML KWPE: 100 | 30 days supply | Qty: 15 | Fill #0

## 2020-01-21 MED FILL — ?GLIPIZIDE 10 MG TABLET: 10 | 30 days supply | Qty: 30 | Fill #0

## 2020-01-21 NOTE — Patient Instructions (Signed)

## 2020-01-30 NOTE — Progress Notes (Signed)
Established Patient Office Visit  Subjective:  Patient ID: Julie Robertson, female    DOB: 1976/05/06  Age: 44 y.o. MRN: [id number]  CC:  Chief Complaint  Patient presents with  . Medication Refill    Pt. is here for diabeties medication refill.     HPI Ms.Julie Robertson presents for management of type 2 diabetes and requesting medication refills.  Patient stated she had gone to the pharmacy several times and no medication was ready.  Call pharmacy while patient was in the room medication had been prepared since the beginning of the month she did not pick it up and was returned to stop.  Ask pharmacist assistant to please have medication refill and she will pick it up today.   Past Medical History:  Diagnosis Date  . Anxiety   . Asthma   . Diabetes mellitus without complication ([place])   . Diabetic neuropathy ([place]) 12/2013   vs carpal tunnell.  numbness tingling in right fingers. rx with Gabapentin.  Marland Kitchen Dyslipidemia 08/2009  . Dyspnea   . Gall stones 08/2009  . GERD (gastroesophageal reflux disease)   . Obesity    BMI 41, 250# 03/2015    Past Surgical History:  Procedure Laterality Date  . APPLICATION OF WOUND VAC N/A 05/09/2019   Procedure: APPLICATION OF WOUND VAC;  Surgeon: Olean Ree, MD;  Location: [facility];  Service: General;  Laterality: N/A;  [id number]  . CHOLECYSTECTOMY N/A 03/10/2015   Procedure: LAPAROSCOPIC CHOLECYSTECTOMY WITH INTRAOPERATIVE CHOLANGIOGRAM;  Surgeon: Georganna Skeans, MD;  Location: [place];  Service: General;  Laterality: N/A;  . ERCP N/A 03/11/2015   Procedure: ENDOSCOPIC RETROGRADE CHOLANGIOPANCREATOGRAPHY (ERCP);  Surgeon: Milus Banister, MD;  Location: [place];  Service: Endoscopy;  Laterality: N/A;  . ESOPHAGOGASTRODUODENOSCOPY (EGD) WITH PROPOFOL N/A 07/25/2018   Procedure: ESOPHAGOGASTRODUODENOSCOPY (EGD) WITH PROPOFOL;  Surgeon: Rush Landmark Telford Nab., MD;  Location: [facility];  Service: Gastroenterology;  Laterality: N/A;   . INSERTION OF MESH N/A 05/09/2019   Procedure: INSERTION OF MESH;  Surgeon: Olean Ree, MD;  Location: [facility];  Service: General;  Laterality: N/A;  . Transduodenal Ampullectomy  09/2015  . TUBAL LIGATION    . VENTRAL HERNIA REPAIR N/A 05/09/2019   Procedure: HERNIA REPAIR VENTRAL ADULT with MESH;  Surgeon: Olean Ree, MD;  Location: [facility];  Service: General;  Laterality: N/A;    Family History  Problem Relation Age of Onset  . Diabetes Mother   . Heart disease Mother   . Hypertension Mother   . Diabetes Father   . Bone cancer Maternal Grandfather   . Kidney disease Maternal Uncle   . Other Son        had kidney removed due to gun shot wound    Social History   Socioeconomic History  . Marital status: Married    Spouse name: Not on file  . Number of children: 2  . Years of education: Not on file  . Highest education level: Not on file  Occupational History  . Occupation: house cleaning  Tobacco Use  . Smoking status: Current Every Day Smoker    Packs/day: 1.50    Years: 4.00    Pack years: 6.00    Types: Cigarettes  . Smokeless tobacco: Never Used  Vaping Use  . Vaping Use: Never used  Substance and Sexual Activity  . Alcohol use: Yes    Comment: rare  . Drug use: No  . Sexual activity: Not on file  Other Topics Concern  .  Not on file  Social History Narrative   Patient has 2 sons one is 24, the other 74 as of 03/2015. Her kids are not the children of her current husband. As of 03/2015 she is not employed outside the home.   Social Determinants of Health   Financial Resource Strain:   . Difficulty of Paying Living Expenses:   Food Insecurity:   . Worried About Programme researcher, broadcasting/film/video in the Last Year:   . Barista in the Last Year:   Transportation Needs:   . Freight forwarder (Medical):   Marland Kitchen Lack of Transportation (Non-Medical):   Physical Activity:   . Days of Exercise per Week:   . Minutes of Exercise per Session:   Stress:   .  Feeling of Stress :   Social Connections:   . Frequency of Communication with Friends and Family:   . Frequency of Social Gatherings with Friends and Family:   . Attends Religious Services:   . Active Member of Clubs or Organizations:   . Attends Banker Meetings:   Marland Kitchen Marital Status:   Intimate Partner Violence:   . Fear of Current or Ex-Partner:   . Emotionally Abused:   Marland Kitchen Physically Abused:   . Sexually Abused:     Outpatient Medications Prior to Visit  Medication Sig Dispense Refill  . albuterol (VENTOLIN HFA) 108 (90 Base) MCG/ACT inhaler Inhale 2 puffs into the lungs every 6 (six) hours as needed for wheezing or shortness of breath. 1 Inhaler 1  . Blood Glucose Monitoring Suppl (TRUE METRIX METER) w/Device KIT Use as directed 1 kit 0  . docusate sodium (COLACE) 100 MG capsule Take 1 capsule (100 mg total) by mouth daily. 10 capsule 0  . gabapentin (NEURONTIN) 300 MG capsule Take 1 capsule (300 mg total) by mouth 3 (three) times daily. 90 capsule 6  . gentamicin cream (GARAMYCIN) 0.1 % Apply 1 application topically 2 (two) times daily. 15 g 1  . glipiZIDE (GLUCOTROL) 10 MG tablet Take 1 tablet (10 mg total) by mouth daily before breakfast. 90 tablet 1  . glucose blood (TRUE METRIX BLOOD GLUCOSE TEST) test strip Use as instructed 100 each 12  . HYDROmorphone (DILAUDID) 2 MG tablet Take 1 tablet (2 mg total) by mouth every 6 (six) hours as needed for severe pain (Breakthrough PAIN). 30 tablet 0  . ibuprofen (ADVIL) 800 MG tablet Take 1 tablet (800 mg total) by mouth every 8 (eight) hours as needed. 30 tablet 0  . Insulin Glargine (BASAGLAR KWIKPEN) 100 UNIT/ML Inject 0.5 mLs (50 Units total) into the skin at bedtime. 15 mL 3  . Insulin Pen Needle 31G X 5 MM MISC Inject 10 units subcutaneous at bedtime 100 each 0  . nicotine (NICODERM CQ - DOSED IN MG/24 HOURS) 21 mg/24hr patch Place 1 patch (21 mg total) onto the skin daily. 14 patch 0  . omeprazole (PRILOSEC) 20 MG  capsule TAKE 1 CAPSULE (20 MG TOTAL) BY MOUTH DAILY. 30 capsule 3  . polyethylene glycol (MIRALAX / GLYCOLAX) 17 g packet Take 17 g by mouth daily. 14 each 0  . terbinafine (LAMISIL) 250 MG tablet Take 1 tablet (250 mg total) by mouth daily. 90 tablet 0  . TRUEPLUS LANCETS 28G MISC Use as directed 100 each 12  . atorvastatin (LIPITOR) 40 MG tablet Take 1 tablet (40 mg total) by mouth at bedtime. 30 tablet 3  . HUMALOG 100 UNIT/ML injection INJECT 12 UNITS INTO THE  SKIN 3 TIMES DAILY.4 UNITS 200-250,6UNITS251-300,8UNITS 301-350,10 UNITS 351-400,12 UNITS 401-450(CALL IF BS>450) 30 mL 0  . insulin aspart (NOVOLOG) 100 UNIT/ML injection Inject 12 Units into the skin 3 (three) times daily before meals. 4 units 200-250, 6 units 251-300, 8 units 301-350 , 10 units 351-400 , 12 units 401-450 (call if bs >450) 30 mL 1  . losartan (COZAAR) 25 MG tablet TAKE 1 TABLET (25 MG TOTAL) BY MOUTH DAILY. 30 tablet 3  . metFORMIN (GLUCOPHAGE-XR) 500 MG 24 hr tablet Take 2 tablets (1,000 mg total) by mouth 2 (two) times daily. 120 tablet 1   No facility-administered medications prior to visit.    No Known Allergies  ROS Review of Systems  All other systems reviewed and are negative.     Objective:    Physical Exam Vitals reviewed.  Constitutional:      Appearance: She is obese.  HENT:     Head: Normocephalic.     Right Ear: Tympanic membrane normal.     Left Ear: Tympanic membrane normal.     Nose: Nose normal.  Cardiovascular:     Rate and Rhythm: Normal rate and regular rhythm.     Pulses: Normal pulses.     Heart sounds: Normal heart sounds.  Pulmonary:     Effort: Pulmonary effort is normal.     Breath sounds: Normal breath sounds.  Abdominal:     General: Bowel sounds are normal.  Musculoskeletal:     Cervical back: Neck supple.  Skin:    General: Skin is warm and dry.  Neurological:     Mental Status: She is alert and oriented to person, place, and time.  Psychiatric:        Mood and  Affect: Mood normal.        Behavior: Behavior normal.        Thought Content: Thought content normal.        Judgment: Judgment normal.     BP (!) 146/76 (BP Location: Left Arm, Patient Position: Sitting, Cuff Size: Normal)   Pulse 86   Ht _0  (1.651 m)   Wt 268 lb (121.6 kg)   LMP 12/22/2019   SpO2 98%   BMI 44.60 kg/m  Wt Readings from Last 3 Encounters:  01/21/20 268 lb (121.6 kg)  12/25/19 260 lb (117.9 kg)  07/10/19 259 lb (117.5 kg)     Health Maintenance Due  Topic Date Due  . OPHTHALMOLOGY EXAM  Never done  . COVID-19 Vaccine (1) Never done  . FOOT EXAM  08/25/2017  . PAP SMEAR-Modifier  07/22/2018    There are no preventive care reminders to display for this patient.  Lab Results  Component Value Date   TSH 3.090 03/22/2018   Lab Results  Component Value Date   WBC 12.6 (H) 05/10/2019   HGB 11.3 (L) 05/10/2019   HCT 36.6 05/10/2019   MCV 83.4 05/10/2019   PLT 229 05/10/2019   Lab Results  Component Value Date   NA 140 05/10/2019   K 4.7 05/10/2019   CO2 22 05/10/2019   GLUCOSE 218 (H) 05/10/2019   BUN 12 05/10/2019   CREATININE 0.84 05/10/2019   BILITOT <0.2 03/26/2019   ALKPHOS 87 03/26/2019   AST 31 03/26/2019   ALT 43 (H) 03/26/2019   PROT 7.1 03/26/2019   ALBUMIN 4.1 03/26/2019   CALCIUM 7.1 (L) 05/10/2019   ANIONGAP 7 05/10/2019   Lab Results  Component Value Date   CHOL 141 03/26/2019  Lab Results  Component Value Date   HDL 37 (L) 03/26/2019   Lab Results  Component Value Date   LDLCALC 76 03/26/2019   Lab Results  Component Value Date   TRIG 160 (H) 03/26/2019   Lab Results  Component Value Date   CHOLHDL 3.8 03/26/2019   Lab Results  Component Value Date   HGBA1C 11.8 (A) 01/21/2020      Assessment & Plan:  Alex was seen today for medication refill.  Diagnoses and all orders for this visit:  Uncontrolled type 2 diabetes mellitus with hyperglycemia, with long-term current use of insulin  ([place]) Patient's A1c is 11.87 months ago it was 9.2 states is because she was out of medication reminded patient that a A1c is over a 97-monthspan of time Added Humalog KwikPen with sliding scale between meals -     Glucose (CBG) -     HgB A1c --     metFORMIN (GLUCOPHAGE-XR) 500 MG 24 hr tablet; Take 2 tablets (1,000 mg total) by mouth 2 (two) times daily. -     losartan (COZAAR) 25 MG tablet; Take 1 tablet (25 mg total) by mouth daily.  Essential hypertension Blood pressure is also elevated at this time she will continue to take losartan 25 mg daily.  Blood pressure goal is less than or equal to 130/80.  Encourage low-sodium diet and exercising routinely    Other orders/Medication refill -     insulin lispro (HUMALOG KWIKPEN) 100 UNIT/ML KwikPen; INJECT 12 UNITS INTO THE SKIN 3 TIMES DAILY.4 UNITS 200-250,6UNITS251-300,8UNITS 301-350,10 UNITS 351-400,12 UNITS 401-450(CALL IF BS>450),  metFORMIN (GLUCOPHAGE-XR) 500 MG 24 hr tablet; Take 2 tablets (1,000 mg total) by mouth 2 (two) times daily. -     losartan (COZAAR) 25 MG tablet; Take 1 tablet (25 mg total) by mouth daily.     Meds ordered this encounter  Medications  . DISCONTD: insulin lispro (HUMALOG) 100 UNIT/ML injection    Sig: INJECT 12 UNITS INTO THE SKIN 3 TIMES DAILY.4 UNITS 200-250,6UNITS251-300,8UNITS 301-350,10 UNITS 351-400,12 UNITS 401-450(CALL IF BS>450)    Dispense:  30 mL    Refill:  0  . metFORMIN (GLUCOPHAGE-XR) 500 MG 24 hr tablet    Sig: Take 2 tablets (1,000 mg total) by mouth 2 (two) times daily.    Dispense:  120 tablet    Refill:  1  . losartan (COZAAR) 25 MG tablet    Sig: Take 1 tablet (25 mg total) by mouth daily.    Dispense:  90 tablet    Refill:  3    On 01/02/2020 given a 30 day courtesy refill.  Message left for pt to call in for an appt.  . insulin lispro (HUMALOG KWIKPEN) 100 UNIT/ML KwikPen    Sig: INJECT 12 UNITS INTO THE SKIN 3 TIMES DAILY.4 UNITS 200-250,6UNITS251-300,8UNITS 301-350,10 UNITS  351-400,12 UNITS 401-450(CALL IF BS>450),    Dispense:  15 mL    Refill:  11    Follow-up: Return in about 3 months (around 04/22/2020) for in person DM.    MKerin Perna NP

## 2020-03-02 ENCOUNTER — Other Ambulatory Visit (INDEPENDENT_AMBULATORY_CARE_PROVIDER_SITE_OTHER): Payer: Self-pay | Admitting: Primary Care

## 2020-03-02 DIAGNOSIS — Z794 Long term (current) use of insulin: Secondary | ICD-10-CM

## 2020-03-02 MED FILL — GABAPENTIN 300 MG CAPSULE: 300 | 30 days supply | Qty: 90 | Fill #0

## 2020-03-02 MED FILL — ?OMEPRAZOLE 20 MG CPDR: 20 | 30 days supply | Qty: 30 | Fill #3

## 2020-03-02 MED FILL — ?HUMALOG 100 UNITS/ML KWIKP: 100 | 32 days supply | Qty: 12 | Fill #1

## 2020-03-02 MED FILL — ?GLIPIZIDE 10 MG TABLET: 10 | 30 days supply | Qty: 30 | Fill #1

## 2020-03-02 MED FILL — ?BASAGLAR 100 UNITS/ML KWPE: 100 | 30 days supply | Qty: 15 | Fill #1

## 2020-03-02 MED FILL — ?ATORVASTATIN 40MG TABLET: 40 | 30 days supply | Qty: 30 | Fill #1

## 2020-03-02 MED FILL — metFORMIN HCL ER 500 MG TB2: 500 | 30 days supply | Qty: 120 | Fill #1

## 2020-03-02 MED FILL — LOSARTAN POTASSIUM 25 MG TA: 25 | 30 days supply | Qty: 30 | Fill #2

## 2020-03-02 NOTE — Telephone Encounter (Signed)
Requested Prescriptions  Pending Prescriptions Disp Refills   gabapentin (NEURONTIN) 300 MG capsule [Pharmacy Med Name: GABAPENTIN 300 MG CAPSULE 300 Capsule] 270 capsule 0    Sig: TAKE 1 CAPSULE (300 MG TOTAL) BY MOUTH 3 (THREE) TIMES DAILY.     Neurology: Anticonvulsants - gabapentin Passed - 03/02/2020 10:45 AM      Passed - Valid encounter within last 12 months    Recent Outpatient Visits          1 month ago Uncontrolled type 2 diabetes mellitus with hyperglycemia, with long-term current use of insulin ([place])   [place] Juluis Mire P, NP   8 months ago Uncontrolled type 2 diabetes mellitus with hyperglycemia, with long-term current use of insulin ([place])   [place] Juluis Mire P, NP   11 months ago Uncontrolled type 2 diabetes mellitus with hyperglycemia, with long-term current use of insulin ([place])   [place] Juluis Mire P, NP   1 year ago Uncontrolled type 2 diabetes mellitus with hyperglycemia, with long-term current use of insulin ([place])   [place] Kerin Perna, NP   1 year ago Uncontrolled type 2 diabetes mellitus with hyperglycemia, with long-term current use of insulin ([place])   [place] Kerin Perna, NP

## 2020-03-23 ENCOUNTER — Encounter (INDEPENDENT_AMBULATORY_CARE_PROVIDER_SITE_OTHER): Payer: Self-pay | Admitting: Primary Care

## 2020-03-23 ENCOUNTER — Ambulatory Visit (HOSPITAL_COMMUNITY): Admission: EM | Admit: 2020-03-23 | Discharge: 2020-03-23 | Discharge status: Against Medical Advice | Payer: Self-pay

## 2020-03-23 ENCOUNTER — Other Ambulatory Visit: Payer: Self-pay

## 2020-03-23 ENCOUNTER — Ambulatory Visit (INDEPENDENT_AMBULATORY_CARE_PROVIDER_SITE_OTHER): Payer: Self-pay | Admitting: Primary Care

## 2020-03-23 ENCOUNTER — Encounter (INDEPENDENT_AMBULATORY_CARE_PROVIDER_SITE_OTHER): Payer: Self-pay

## 2020-03-23 VITALS — BP 112/75 | HR 93 | Temp 97.5°F | Ht 65.0 in | Wt 268.0 lb

## 2020-03-23 DIAGNOSIS — Z794 Long term (current) use of insulin: Secondary | ICD-10-CM

## 2020-03-23 DIAGNOSIS — N3001 Acute cystitis with hematuria: Secondary | ICD-10-CM

## 2020-03-23 DIAGNOSIS — E1165 Type 2 diabetes mellitus with hyperglycemia: Secondary | ICD-10-CM

## 2020-03-23 DIAGNOSIS — R300 Dysuria: Secondary | ICD-10-CM

## 2020-03-23 LAB — POCT URINALYSIS DIP (CLINITEK)
Bilirubin, UA: NEGATIVE
Glucose, UA: >=1000 mg/dL — AB
Nitrite, UA: POSITIVE — AB
POC PROTEIN,UA: 100 — AB
Spec Grav, UA: 1.02 (ref 1.010–1.025)
Urobilinogen, UA: 0.2 E.U./dL
pH, UA: 5.5 (ref 5.0–8.0)

## 2020-03-23 LAB — GLUCOSE, POCT (MANUAL RESULT ENTRY): POC Glucose: 298 mg/dl — AB (ref 70–99)

## 2020-03-23 MED ORDER — PHENAZOPYRIDINE HCL 100 MG PO TABS: 100.0000 mg | ORAL_TABLET | Freq: Three times a day (TID) | ORAL | 0 refills | Status: DC | PRN

## 2020-03-23 MED FILL — PHENAZOPYRIDINE 100 MG TAB: 100 | 3 days supply | Qty: 10 | Fill #0

## 2020-03-23 NOTE — Patient Instructions (Signed)
Retencin urinaria aguda en las mujeres Acute Urinary Retention, Female  La retencin urinaria aguda sucede cuando no puede hacer pis (orinar), o cuando orina muy poco y su vejiga no est del todo vaca. Si no se trata, puede desencadenar en dao renal u otros problemas graves. Siga estas indicaciones en su casa:  Tome los medicamentos de venta libre y los recetados solamente como se lo haya indicado el mdico. Pregntele a su mdico qu medicamentos no debe ingerir. No use ningn medicamento excepto que el mdico lo apruebe.  Si volvi a su hogar con un tubo que drena la orina de la vejiga (catter), cudelo tal como se lo haya indicado su mdico.  Beba suficiente lquido para mantener el pis claro o de color amarillo plido.  Si le recetaron un antibitico, tmelo o aplquelo como se lo haya indicado el mdico. No deje de tomar los antibiticos aunque comience a Sports administrator.  No consuma ningn producto que contenga nicotina o tabaco, como cigarrillos y Psychologist, sport and exercise. Si necesita ayuda para dejar de fumar, consulte al mdico.  Controle los cambios en sus sntomas. Dgale a su mdico sobre ellos.  Haga un seguimiento de los cambios en la presin arterial en su hogar, si as se le indic. Dgale a su mdico sobre ellos.  Concurra a todas las visitas de control como se lo haya indicado el mdico. Esto es importante. Comunquese con un mdico si:  Tiene espasmos o pierde orina cuando tiene espasmos. Solicite ayuda de inmediato si:  Tiene escalofros o fiebre.  Observa sangre en la orina.  Tiene un tubo que drena la vejiga, y: ? El tubo no drena la orina. ? El tubo se sale. Resumen  La retencin urinaria [place] sucede cuando no puede orinar, o cuando [place] y su vejiga no est del todo vaca. Si no se trata, puede resultar en dao renal u otros problemas graves.  Si volvi a su hogar con un tubo que drena la orina de la vejiga, cudelo como se lo haya indicado su  mdico.  Est atento a cualquier [employer] sntomas. Dgale a su mdico sobre ellos. Esta informacin no tiene Marine scientist el consejo del mdico. Asegrese de hacerle al mdico cualquier pregunta que tenga. Document Revised: 04/26/2017 Document Reviewed: 12/13/2016 Elsevier Patient Education  2020 [employer].

## 2020-03-23 NOTE — Progress Notes (Signed)
[place]    CC: Burning with urination  SUBJECTIVE:  Julie Robertson is a 44 y.o. female who complains of urinary frequency, urgency and dysuria for the past 7 days.  Patient denies a precipitating event, recent sexual encounter, excessive caffeine intake.  Localizes the pain to the lower abdomen/ flank.  Pain is  constant and describes it as sharp/ burning.  Has tried OTC medications without relief.  Symptoms are made worse with urination.  Denies fever, chills, nausea, vomiting, abdominal pain, flank pain, abnormal vaginal discharge or bleeding, hematuria.    LMP: Patient's last menstrual period was 03/09/2020 (approximate).  ROS: As in HPI.  All other pertinent ROS negative.     Past Medical History:  Diagnosis Date  . Anxiety   . Asthma   . Diabetes mellitus without complication ([place])   . Diabetic neuropathy ([place]) 12/2013   vs carpal tunnell.  numbness tingling in right fingers. rx with Gabapentin.  Marland Kitchen Dyslipidemia 08/2009  . Dyspnea   . Gall stones 08/2009  . GERD (gastroesophageal reflux disease)   . Obesity    BMI 41, 250# 03/2015   Past Surgical History:  Procedure Laterality Date  . APPLICATION OF WOUND VAC N/A 05/09/2019   Procedure: APPLICATION OF WOUND VAC;  Surgeon: Olean Ree, MD;  Location: [facility];  Service: General;  Laterality: N/A;  [id number]  . CHOLECYSTECTOMY N/A 03/10/2015   Procedure: LAPAROSCOPIC CHOLECYSTECTOMY WITH INTRAOPERATIVE CHOLANGIOGRAM;  Surgeon: Georganna Skeans, MD;  Location: [place];  Service: General;  Laterality: N/A;  . ERCP N/A 03/11/2015   Procedure: ENDOSCOPIC RETROGRADE CHOLANGIOPANCREATOGRAPHY (ERCP);  Surgeon: Milus Banister, MD;  Location: [place];  Service: Endoscopy;  Laterality: N/A;  . ESOPHAGOGASTRODUODENOSCOPY (EGD) WITH PROPOFOL N/A 07/25/2018   Procedure: ESOPHAGOGASTRODUODENOSCOPY (EGD) WITH PROPOFOL;  Surgeon: Rush Landmark Telford Nab., MD;  Location: [facility];  Service: Gastroenterology;   Laterality: N/A;  . INSERTION OF MESH N/A 05/09/2019   Procedure: INSERTION OF MESH;  Surgeon: Olean Ree, MD;  Location: [facility];  Service: General;  Laterality: N/A;  . Transduodenal Ampullectomy  09/2015  . TUBAL LIGATION    . VENTRAL HERNIA REPAIR N/A 05/09/2019   Procedure: HERNIA REPAIR VENTRAL ADULT with MESH;  Surgeon: Olean Ree, MD;  Location: [facility];  Service: General;  Laterality: N/A;   No Known Allergies Current Outpatient Medications on File Prior to Visit  Medication Sig Dispense Refill  . albuterol (VENTOLIN HFA) 108 (90 Base) MCG/ACT inhaler Inhale 2 puffs into the lungs every 6 (six) hours as needed for wheezing or shortness of breath. 1 Inhaler 1  . atorvastatin (LIPITOR) 40 MG tablet Take 1 tablet (40 mg total) by mouth at bedtime. 30 tablet 3  . Blood Glucose Monitoring Suppl (TRUE METRIX METER) w/Device KIT Use as directed 1 kit 0  . docusate sodium (COLACE) 100 MG capsule Take 1 capsule (100 mg total) by mouth daily. 10 capsule 0  . gabapentin (NEURONTIN) 300 MG capsule TAKE 1 CAPSULE (300 MG TOTAL) BY MOUTH 3 (THREE) TIMES DAILY. 270 capsule 0  . glipiZIDE (GLUCOTROL) 10 MG tablet Take 1 tablet (10 mg total) by mouth daily before breakfast. 90 tablet 1  . glucose blood (TRUE METRIX BLOOD GLUCOSE TEST) test strip Use as instructed 100 each 12  . ibuprofen (ADVIL) 800 MG tablet Take 1 tablet (800 mg total) by mouth every 8 (eight) hours as needed. 30 tablet 0  . Insulin Glargine (BASAGLAR KWIKPEN) 100 UNIT/ML Inject 0.5 mLs (50 Units total)  into the skin at bedtime. 15 mL 3  . insulin lispro (HUMALOG KWIKPEN) 100 UNIT/ML KwikPen INJECT 12 UNITS INTO THE SKIN 3 TIMES DAILY.4 UNITS 200-250,6UNITS251-300,8UNITS 301-350,10 UNITS 351-400,12 UNITS 401-450(CALL IF BS>450), 15 mL 11  . Insulin Pen Needle 31G X 5 MM MISC Inject 10 units subcutaneous at bedtime 100 each 0  . losartan (COZAAR) 25 MG tablet Take 1 tablet (25 mg total) by mouth daily. 90 tablet 3  . metFORMIN  (GLUCOPHAGE-XR) 500 MG 24 hr tablet Take 2 tablets (1,000 mg total) by mouth 2 (two) times daily. 120 tablet 1  . omeprazole (PRILOSEC) 20 MG capsule TAKE 1 CAPSULE (20 MG TOTAL) BY MOUTH DAILY. 30 capsule 3  . polyethylene glycol (MIRALAX / GLYCOLAX) 17 g packet Take 17 g by mouth daily. 14 each 0  . TRUEPLUS LANCETS 28G MISC Use as directed 100 each 12   No current facility-administered medications on file prior to visit.   Social History   Socioeconomic History  . Marital status: Married    Spouse name: Not on file  . Number of children: 2  . Years of education: Not on file  . Highest education level: Not on file  Occupational History  . Occupation: house cleaning  Tobacco Use  . Smoking status: Current Every Day Smoker    Packs/day: 1.50    Years: 4.00    Pack years: 6.00    Types: Cigarettes  . Smokeless tobacco: Never Used  Vaping Use  . Vaping Use: Never used  Substance and Sexual Activity  . Alcohol use: Yes    Comment: rare  . Drug use: No  . Sexual activity: Not on file  Other Topics Concern  . Not on file  Social History Narrative   Patient has 2 sons one is 48, the other 27 as of 03/2015. Her kids are not the children of her current husband. As of 03/2015 she is not employed outside the home.   Social Determinants of Health   Financial Resource Strain:   . Difficulty of Paying Living Expenses: Not on file  Food Insecurity:   . Worried About Charity fundraiser in the Last Year: Not on file  . Ran Out of Food in the Last Year: Not on file  Transportation Needs:   . Lack of Transportation (Medical): Not on file  . Lack of Transportation (Non-Medical): Not on file  Physical Activity:   . Days of Exercise per Week: Not on file  . Minutes of Exercise per Session: Not on file  Stress:   . Feeling of Stress : Not on file  Social Connections:   . Frequency of Communication with Friends and Family: Not on file  . Frequency of Social Gatherings with Friends and  Family: Not on file  . Attends Religious Services: Not on file  . Active Member of Clubs or Organizations: Not on file  . Attends Archivist Meetings: Not on file  . Marital Status: Not on file  Intimate Partner Violence:   . Fear of Current or Ex-Partner: Not on file  . Emotionally Abused: Not on file  . Physically Abused: Not on file  . Sexually Abused: Not on file   Family History  Problem Relation Age of Onset  . Diabetes Mother   . Heart disease Mother   . Hypertension Mother   . Diabetes Father   . Bone cancer Maternal Grandfather   . Kidney disease Maternal Uncle   . Other Son  had kidney removed due to gun shot wound    OBJECTIVE:  Vitals:   03/23/20 1428  BP: 112/75  Pulse: 93  Temp: (!) 97.5 F (36.4 C)  TempSrc: Temporal  SpO2: 97%  Weight: 268 lb (121.6 kg)  Height: 5' 5" (1.651 m)   General appearance: AOx3 in no acute distress HEENT: NCAT.   Lungs: clear to auscultation bilaterally without adventitious breath sounds Heart: regular rate and rhythm.  Radial pulses 2+ symmetrical bilaterally Abdomen: soft; non-distended; no tenderness; bowel sounds present; no guarding or rebound tenderness Back: no CVA tenderness Extremities: no edema; symmetrical with no gross deformities Skin: warm and dry Neurologic: Ambulates from chair to exam table without difficulty Psychological: alert and cooperative; normal mood and affect  ASSESSMENT & PLAN: Francisca was seen today for dysuria.  Diagnoses and all orders for this visit:  Dysuria -     POCT URINALYSIS DIP (CLINITEK) Urine culture sent.  We will call you with the results.   Push fluids and get plenty of rest.   Take antibiotic as directed and to completion Take pyridium as prescribed and as needed for symptomatic relief

## 2020-03-25 ENCOUNTER — Telehealth (INDEPENDENT_AMBULATORY_CARE_PROVIDER_SITE_OTHER): Payer: Self-pay | Admitting: Primary Care

## 2020-03-25 NOTE — Telephone Encounter (Signed)
Sent to PCP ?

## 2020-03-26 ENCOUNTER — Other Ambulatory Visit (INDEPENDENT_AMBULATORY_CARE_PROVIDER_SITE_OTHER): Payer: Self-pay | Admitting: Primary Care

## 2020-03-26 LAB — URINE CULTURE

## 2020-03-26 MED ORDER — NITROFURANTOIN MONOHYD MACRO 100 MG PO CAPS: 100.0000 mg | ORAL_CAPSULE | Freq: Two times a day (BID) | ORAL | 0 refills | Status: DC

## 2020-03-27 ENCOUNTER — Telehealth (INDEPENDENT_AMBULATORY_CARE_PROVIDER_SITE_OTHER): Payer: Self-pay

## 2020-03-27 ENCOUNTER — Other Ambulatory Visit (INDEPENDENT_AMBULATORY_CARE_PROVIDER_SITE_OTHER): Payer: Self-pay | Admitting: Primary Care

## 2020-03-27 DIAGNOSIS — E1165 Type 2 diabetes mellitus with hyperglycemia: Secondary | ICD-10-CM

## 2020-03-27 MED FILL — ?BASAGLAR 100 UNITS/ML KWPE: 100 | 30 days supply | Qty: 15 | Fill #2

## 2020-03-27 MED FILL — ?GLIPIZIDE 10 MG TABLET: 10 | 30 days supply | Qty: 30 | Fill #2

## 2020-03-27 MED FILL — ?ATORVASTATIN 40MG TABLET: 40 | 30 days supply | Qty: 30 | Fill #2

## 2020-03-27 MED FILL — LOSARTAN POTASSIUM 25 MG TA: 25 | 30 days supply | Qty: 30 | Fill #3

## 2020-03-27 MED FILL — NITROFURANTOIN MONO-MCR 100: 100 | 5 days supply | Qty: 10 | Fill #0

## 2020-03-27 NOTE — Telephone Encounter (Signed)
Patient is aware that medication has been sent to pharmacy.  

## 2020-03-27 NOTE — Telephone Encounter (Signed)
-----   Message from Kerin Perna, NP sent at 03/27/2020  2:08 PM EDT ----- Patient has UTI abt's sent in

## 2020-03-27 NOTE — Telephone Encounter (Signed)
Would you call patient and let her know that medication for UTI has been sent.

## 2020-03-27 NOTE — Telephone Encounter (Signed)
Called patient with the assistance of pacific interpreter Lynette([phone]) patient is aware of UTI and antibiotic being sent to pharmacy.  Nat Christen, CMA

## 2020-03-27 NOTE — Telephone Encounter (Signed)
Patient called wanting to know lab results.

## 2020-03-28 ENCOUNTER — Other Ambulatory Visit (INDEPENDENT_AMBULATORY_CARE_PROVIDER_SITE_OTHER): Payer: Self-pay | Admitting: Primary Care

## 2020-03-30 MED FILL — ?OMEPRAZOLE 20 MG CPDR: 20 | 30 days supply | Qty: 30 | Fill #0

## 2020-03-30 MED FILL — metFORMIN HCL ER 500 MG TB2: 500 | 30 days supply | Qty: 120 | Fill #0

## 2020-03-30 MED FILL — GABAPENTIN 300 MG CAPSULE: 300 | 30 days supply | Qty: 90 | Fill #1

## 2020-04-22 ENCOUNTER — Ambulatory Visit (INDEPENDENT_AMBULATORY_CARE_PROVIDER_SITE_OTHER): Payer: Self-pay | Admitting: Primary Care

## 2020-04-29 MED FILL — GABAPENTIN 300 MG CAPSULE: 300 | 30 days supply | Qty: 90 | Fill #1

## 2020-04-29 MED FILL — ?ATORVASTATIN 40MG TABLET: 40 | 30 days supply | Qty: 30 | Fill #3

## 2020-04-29 MED FILL — ?BASAGLAR 100 UNITS/ML KWPE: 100 | 30 days supply | Qty: 15 | Fill #3

## 2020-04-29 MED FILL — ?OMEPRAZOLE 20 MG CPDR: 20 | 30 days supply | Qty: 30 | Fill #0

## 2020-04-29 MED FILL — ?LOSARTAN POTASSI 25MG TAB: 25 | 30 days supply | Qty: 30 | Fill #0

## 2020-04-29 MED FILL — ?GLIPIZIDE 10 MG TABLET: 10 | 30 days supply | Qty: 30 | Fill #3

## 2020-04-29 MED FILL — ?HUMALOG 100 UNITS/ML KWIKP: 100 | 32 days supply | Qty: 12 | Fill #2

## 2020-04-29 MED FILL — metFORMIN HCL ER 500 MG TB2: 500 | 30 days supply | Qty: 120 | Fill #0

## 2020-05-26 ENCOUNTER — Telehealth (INDEPENDENT_AMBULATORY_CARE_PROVIDER_SITE_OTHER): Payer: Self-pay

## 2020-05-26 ENCOUNTER — Other Ambulatory Visit (INDEPENDENT_AMBULATORY_CARE_PROVIDER_SITE_OTHER): Payer: Self-pay | Admitting: Primary Care

## 2020-05-26 ENCOUNTER — Other Ambulatory Visit: Payer: Self-pay | Admitting: Family Medicine

## 2020-05-26 DIAGNOSIS — E1165 Type 2 diabetes mellitus with hyperglycemia: Secondary | ICD-10-CM

## 2020-05-26 DIAGNOSIS — E782 Mixed hyperlipidemia: Secondary | ICD-10-CM

## 2020-05-26 DIAGNOSIS — Z794 Long term (current) use of insulin: Secondary | ICD-10-CM

## 2020-05-26 MED FILL — GABAPENTIN 300 MG CAPSULE: 300 | 30 days supply | Qty: 90 | Fill #2

## 2020-05-26 MED FILL — metFORMIN HCL ER 500 MG TB2: 500 | 30 days supply | Qty: 120 | Fill #1

## 2020-05-26 MED FILL — ?GLIPIZIDE 10 MG TABLET: 10 | 30 days supply | Qty: 30 | Fill #4

## 2020-05-26 MED FILL — ?HUMALOG 100 UNITS/ML KWIKP: 100 | 32 days supply | Qty: 12 | Fill #3

## 2020-05-26 MED FILL — ?OMEPRAZOLE 20 MG CPDR: 20 | 30 days supply | Qty: 30 | Fill #1

## 2020-05-26 MED FILL — ?LOSARTAN POTASSI 25MG TAB: 25 | 30 days supply | Qty: 30 | Fill #1

## 2020-05-26 MED FILL — ?ATORVASTATIN 40MG TABLET: 40 | 30 days supply | Qty: 30 | Fill #0

## 2020-05-26 MED FILL — !BASAGLAR 100 UNIT/ML KWIK: 100 | 18 days supply | Qty: 9 | Fill #0

## 2020-05-26 NOTE — Telephone Encounter (Signed)
Patient needs to schedule a morning appointment for diabetes and fasting labs. No A1c since 03/2019 and no lipids since 03/2019.

## 2020-05-26 NOTE — Telephone Encounter (Signed)
Called patient to schedule an appointment but patient did not answer. I have a left a detailed message on patient voicemail advising to call the office back to schedule an appointment for diabetes and fasting labs.

## 2020-05-26 NOTE — Telephone Encounter (Signed)
Patient called to make a medication refill for   atorvastatin (LIPITOR) 40 MG tablet   Insulin Glargine (BASAGLAR KWIKPEN) 100 UNIT/ML   Patient uses [facility]   Please advice [phone]

## 2020-06-03 ENCOUNTER — Telehealth (INDEPENDENT_AMBULATORY_CARE_PROVIDER_SITE_OTHER): Payer: Self-pay

## 2020-06-03 NOTE — Telephone Encounter (Signed)
Patient went to the pharmacy to request a refill Insulin Glargine (BASAGLAR KWIKPEN) 100 UNIT/ML   Patient told pharmacist that she is injecting 60 units at bedtime and only has 3 days worth left. Pharmacist states the she should have 10 days worth of medication left.   Patient uses [place]    Please advice  [phone]

## 2020-06-04 NOTE — Telephone Encounter (Signed)
Sent to PCP ?

## 2020-06-07 ENCOUNTER — Other Ambulatory Visit (INDEPENDENT_AMBULATORY_CARE_PROVIDER_SITE_OTHER): Payer: Self-pay | Admitting: Primary Care

## 2020-06-07 DIAGNOSIS — Z794 Long term (current) use of insulin: Secondary | ICD-10-CM

## 2020-06-07 DIAGNOSIS — E1165 Type 2 diabetes mellitus with hyperglycemia: Secondary | ICD-10-CM

## 2020-06-07 MED ORDER — BASAGLAR KWIKPEN 100 UNIT/ML ~~LOC~~ SOPN: 60.0000 [IU] | PEN_INJECTOR | Freq: Every day | SUBCUTANEOUS | 0 refills | Status: DC

## 2020-06-08 MED FILL — ?BASAGLAR 100 UNITS/ML KWPE: 100 | 25 days supply | Qty: 15 | Fill #0

## 2020-06-12 NOTE — Telephone Encounter (Signed)
Called patient with pacific interpreter [phone]) left message asking patient to return call to [facility] at [phone].

## 2020-06-17 NOTE — Telephone Encounter (Signed)
Call placed to patient with assistance of pacific interpreter [phone]) left message asking patient to return call to [facility] at [phone]. All attempts to reach patient have been unsuccessful.

## 2020-07-01 ENCOUNTER — Other Ambulatory Visit (INDEPENDENT_AMBULATORY_CARE_PROVIDER_SITE_OTHER): Payer: Self-pay | Admitting: Primary Care

## 2020-07-01 DIAGNOSIS — Z794 Long term (current) use of insulin: Secondary | ICD-10-CM

## 2020-07-01 DIAGNOSIS — E782 Mixed hyperlipidemia: Secondary | ICD-10-CM

## 2020-07-01 MED FILL — ?ATORVASTATIN 40MG TABLET: 40 | 30 days supply | Qty: 30 | Fill #0

## 2020-07-01 MED FILL — ?GLIPIZIDE 10 MG TABLET: 10 | 30 days supply | Qty: 30 | Fill #5

## 2020-07-01 MED FILL — metFORMIN HCL ER 500 MG TB2: 500 | 30 days supply | Qty: 120 | Fill #0

## 2020-07-01 MED FILL — ?LOSARTAN POTASSI 25MG TAB: 25 | 30 days supply | Qty: 30 | Fill #2

## 2020-07-01 MED FILL — ?HUMALOG 100 UNITS/ML KWIKP: 100 | 32 days supply | Qty: 12 | Fill #4

## 2020-07-01 MED FILL — ?OMEPRAZOLE 20 MG CPDR: 20 | 30 days supply | Qty: 30 | Fill #2

## 2020-07-01 MED FILL — GABAPENTIN 300 MG CAPSULE: 300 | 30 days supply | Qty: 90 | Fill #0

## 2020-07-02 ENCOUNTER — Other Ambulatory Visit (INDEPENDENT_AMBULATORY_CARE_PROVIDER_SITE_OTHER): Payer: Self-pay | Admitting: Primary Care

## 2020-07-02 DIAGNOSIS — E1165 Type 2 diabetes mellitus with hyperglycemia: Secondary | ICD-10-CM

## 2020-07-02 DIAGNOSIS — Z794 Long term (current) use of insulin: Secondary | ICD-10-CM

## 2020-07-03 MED FILL — ?BASAGLAR 100 UNITS/ML KWPE: 100 | 25 days supply | Qty: 15 | Fill #0

## 2020-07-30 ENCOUNTER — Ambulatory Visit: Payer: Self-pay

## 2020-07-30 ENCOUNTER — Telehealth: Payer: Self-pay | Admitting: Primary Care

## 2020-07-30 ENCOUNTER — Other Ambulatory Visit (INDEPENDENT_AMBULATORY_CARE_PROVIDER_SITE_OTHER): Payer: Self-pay | Admitting: Primary Care

## 2020-07-30 DIAGNOSIS — E1165 Type 2 diabetes mellitus with hyperglycemia: Secondary | ICD-10-CM

## 2020-07-30 DIAGNOSIS — E782 Mixed hyperlipidemia: Secondary | ICD-10-CM

## 2020-07-30 MED FILL — ?HUMALOG 100 UNITS/ML KWIKP: 100 | 32 days supply | Qty: 12 | Fill #5

## 2020-07-30 MED FILL — LOSARTAN POTASSIUM 25 MG TA: 25 | 30 days supply | Qty: 30 | Fill #3

## 2020-07-30 MED FILL — metFORMIN HCL ER 500 MG TB2: 500 | 30 days supply | Qty: 120 | Fill #1

## 2020-07-30 MED FILL — ?OMEPRAZOLE 20 MG CPDR: 20 | 30 days supply | Qty: 30 | Fill #3

## 2020-07-30 NOTE — Telephone Encounter (Signed)
Sent to PCP to refill.

## 2020-07-30 NOTE — Telephone Encounter (Signed)
Copied from [place] [id number]. Topic: General - Other >> Jul 29, 2020  1:03 PM Leward Quan A wrote: Reason for CRM: Patient called in needing to get some clarification on what is needed in the letter from the person who is helping financially please call patient at Ph# [phone]

## 2020-07-30 NOTE — Telephone Encounter (Signed)
I return Pt call, I spoke with the Pt to tell her what she need to bring for her appt with financial

## 2020-08-02 ENCOUNTER — Other Ambulatory Visit (INDEPENDENT_AMBULATORY_CARE_PROVIDER_SITE_OTHER): Payer: Self-pay | Admitting: Primary Care

## 2020-08-02 NOTE — Telephone Encounter (Signed)
NEEDS APPOINTMENT 

## 2020-08-03 MED FILL — glipiZIDE 10 MG TABS: 10 | 30 days supply | Qty: 30 | Fill #0

## 2020-08-03 MED FILL — !LANTUS SOLOSTAR 100UNITS/M: 100 | 25 days supply | Qty: 15 | Fill #0

## 2020-08-03 MED FILL — ATORVASTATIN CALCIUM 40 MG: 40 | 30 days supply | Qty: 30 | Fill #0

## 2020-08-07 ENCOUNTER — Ambulatory Visit: Payer: Self-pay | Attending: Primary Care

## 2020-08-07 ENCOUNTER — Other Ambulatory Visit: Payer: Self-pay

## 2020-08-11 ENCOUNTER — Ambulatory Visit (INDEPENDENT_AMBULATORY_CARE_PROVIDER_SITE_OTHER): Payer: Self-pay | Admitting: Primary Care

## 2020-08-31 ENCOUNTER — Other Ambulatory Visit (INDEPENDENT_AMBULATORY_CARE_PROVIDER_SITE_OTHER): Payer: Self-pay | Admitting: Primary Care

## 2020-08-31 ENCOUNTER — Other Ambulatory Visit: Payer: Self-pay | Admitting: Primary Care

## 2020-08-31 DIAGNOSIS — Z794 Long term (current) use of insulin: Secondary | ICD-10-CM

## 2020-08-31 DIAGNOSIS — E1165 Type 2 diabetes mellitus with hyperglycemia: Secondary | ICD-10-CM

## 2020-08-31 DIAGNOSIS — E782 Mixed hyperlipidemia: Secondary | ICD-10-CM

## 2020-08-31 MED FILL — ?HUMALOG 100 UNITS/ML KWIKP: 100 | 32 days supply | Qty: 12 | Fill #6

## 2020-08-31 MED FILL — LOSARTAN POTASSIUM 25 MG TA: 25 | 30 days supply | Qty: 30 | Fill #4

## 2020-08-31 MED FILL — ?BASAGLAR 100 UNITS/ML KWPE: 100 | 25 days supply | Qty: 15 | Fill #0

## 2020-08-31 MED FILL — ?ATORVASTATIN 40MG TABLET: 40 | 30 days supply | Qty: 30 | Fill #0

## 2020-08-31 MED FILL — ?OMEPRAZOLE 20 MG CPDR: 20 | 30 days supply | Qty: 30 | Fill #4

## 2020-08-31 MED FILL — ?glipiZIDE 10MG TABLETS: 10 | 30 days supply | Qty: 30 | Fill #0

## 2020-08-31 MED FILL — metFORMIN HCL ER 500 MG TB2: 500 | 30 days supply | Qty: 120 | Fill #0

## 2020-08-31 NOTE — Telephone Encounter (Signed)
Sent to PCP ?

## 2020-09-01 ENCOUNTER — Ambulatory Visit (INDEPENDENT_AMBULATORY_CARE_PROVIDER_SITE_OTHER): Payer: Self-pay | Admitting: Primary Care

## 2020-09-01 ENCOUNTER — Other Ambulatory Visit: Payer: Self-pay

## 2020-09-01 ENCOUNTER — Encounter (INDEPENDENT_AMBULATORY_CARE_PROVIDER_SITE_OTHER): Payer: Self-pay | Admitting: Primary Care

## 2020-09-01 ENCOUNTER — Other Ambulatory Visit: Payer: Self-pay | Admitting: Primary Care

## 2020-09-01 VITALS — BP 100/70 | HR 89 | Temp 97.5°F | Ht 65.0 in | Wt 266.2 lb

## 2020-09-01 DIAGNOSIS — Z23 Encounter for immunization: Secondary | ICD-10-CM

## 2020-09-01 DIAGNOSIS — J810 Acute pulmonary edema: Secondary | ICD-10-CM

## 2020-09-01 DIAGNOSIS — E1165 Type 2 diabetes mellitus with hyperglycemia: Secondary | ICD-10-CM

## 2020-09-01 DIAGNOSIS — C249 Malignant neoplasm of biliary tract, unspecified: Secondary | ICD-10-CM

## 2020-09-01 DIAGNOSIS — I10 Essential (primary) hypertension: Secondary | ICD-10-CM

## 2020-09-01 DIAGNOSIS — E782 Mixed hyperlipidemia: Secondary | ICD-10-CM

## 2020-09-01 DIAGNOSIS — Z794 Long term (current) use of insulin: Secondary | ICD-10-CM

## 2020-09-01 LAB — POCT GLYCOSYLATED HEMOGLOBIN (HGB A1C): Hemoglobin A1C: 12.6 % — AB (ref 4.0–5.6)

## 2020-09-01 LAB — GLUCOSE, POCT (MANUAL RESULT ENTRY): POC Glucose: 340 mg/dl — AB (ref 70–99)

## 2020-09-01 MED ORDER — LOSARTAN POTASSIUM 25 MG PO TABS: 25.0000 mg | ORAL_TABLET | Freq: Every day | ORAL | 3 refills | Status: DC

## 2020-09-01 MED ORDER — INSULIN LISPRO (1 UNIT DIAL) 100 UNIT/ML (KWIKPEN): PEN_INJECTOR | SUBCUTANEOUS | 11 refills | Status: DC

## 2020-09-01 MED ORDER — LANTUS SOLOSTAR 100 UNIT/ML ~~LOC~~ SOPN
PEN_INJECTOR | SUBCUTANEOUS | 6 refills | Status: DC
Start: 2020-09-01 — End: 2021-03-28

## 2020-09-01 MED ORDER — ATORVASTATIN CALCIUM 40 MG PO TABS: 40.0000 mg | ORAL_TABLET | Freq: Every day | ORAL | 1 refills | Status: DC

## 2020-09-01 MED ORDER — INSULIN PEN NEEDLE 31G X 5 MM MISC: 3 refills | Status: DC

## 2020-09-01 MED ORDER — METFORMIN HCL 1000 MG PO TABS: 1000.0000 mg | ORAL_TABLET | Freq: Two times a day (BID) | ORAL | 3 refills | Status: DC

## 2020-09-01 MED ORDER — GLIPIZIDE 10 MG PO TABS: 10.0000 mg | ORAL_TABLET | Freq: Two times a day (BID) | ORAL | 1 refills | Status: DC

## 2020-09-01 NOTE — Patient Instructions (Signed)
Gripe en los adultos Influenza, Adult A la gripe tambin se la conoce como "influenza". Es una [employer], la nariz y la garganta (vas respiratorias). Se transmite fcilmente de persona a persona (es contagiosa). La gripe causa sntomas que son [employer] de un resfro, junto con fiebre alta y dolores corporales. Cules son las causas? La causa de esta afeccin es el virus de la influenza. Puede contraer el virus de las siguientes maneras:  Al inhalar gotitas que quedan en el aire despus de que una persona infectada con gripe tosi o estornud.  Al tocar algo que est contaminado con el virus y [employer] mano a la boca, la nariz o los ojos. Qu incrementa el riesgo? Hay ciertas cosas que lo pueden hacer ms propenso a Nurse, adult. Estas incluyen lo siguiente:  No lavarse las manos con frecuencia.  Tener contacto cercano con [employer] durante la temporada de resfro y gripe.  Tocarse la boca, los ojos o la nariz sin antes lavarse las manos.  No recibir la [employer]. Puede correr un mayor riesgo de [place] gripe, y [place] graves, como una infeccin pulmonar (neumona), si usted:  Es mayor de 7 aos de edad.  Est embarazada.  Tiene debilitado el sistema que combate las defensas (sistema inmunitario) debido a una enfermedad o a que toma determinados medicamentos.  Tiene una afeccin a largo plazo (crnica), como las siguientes: ? Enfermedad cardaca, renal o pulmonar. ? Diabetes. ? Asma.  Tiene un trastorno heptico.  Tiene mucho sobrepeso (obesidad [place]).  Tiene anemia. Cules son los signos o sntomas? Los sntomas normalmente comienzan de repente y [place] [address] y [address] Pueden incluir los siguientes:  Cristy Hilts y [place].  Dolores de [place], dolores en el cuerpo o dolores musculares.  Dolor de Investment banker, operational.  Tos.  Secrecin o congestin nasal.  Molestias en el pecho.  No querer comer tanto como lo hace  normalmente.  Sensacin de debilidad o cansancio.  Mareos.  Malestar estomacal o vmitos. Cmo se trata? Si la gripe se detecta de forma temprana, puede recibir tratamiento con medicamentos antivirales. Esto puede ayudar a reducir la gravedad y la duracin de la enfermedad. Se los administrarn por boca o a travs de un tubo (catter) intravenoso. Cuidarse en su hogar puede ayudar a que mejoren los sntomas. El mdico puede recomendarle lo siguiente:  Tomar medicamentos de Radio broadcast assistant.  Beber abundante lquido. La gripe suele desaparecer sola. Si tiene sntomas muy graves u otros problemas, puede recibir tratamiento en un hospital. Siga estas instrucciones en su casa: Actividad  Descanse todo lo que sea necesario. Duerma lo suficiente.  Foy [place] en su casa y no concurra al [place] o a la escuela, como se lo haya indicado el mdico. ? No salga de su casa hasta que no haya tenido fiebre por 24horas sin tomar medicamentos. ? Salga de su casa solamente para ir al [employer]. Comida y bebida  Wyatt Haste SRO (solucin de rehidratacin oral). Es Ardelia Mems bebida que se vende en farmacias y tiendas.  Beba suficiente lquido como para Theatre manager la orina de color amarillo plido.  En la medida en que pueda, beba lquidos transparentes en pequeas cantidades. Los lquidos transparentes son, por ejemplo: ? Grayce Sessions. ? Trocitos de hielo. ? Jugo de frutas mezclado con agua. ? Bebidas deportivas de bajas caloras.  Coma alimentos suaves que sean fciles de digerir. En la medida que pueda, consuma pequeas cantidades. Estos alimentos incluyen: ? Bananas. ? Pur de [employer]. ?  Arroz. Ezekiel Slocumb. ? Tostadas. ? Galletas.  No coma ni beba lo siguiente: ? Lquidos con alto contenido de azcar o cafena. ? Alcohol. ? Alimentos condimentados o con alto contenido de [place]. Indicaciones generales  Use los medicamentos de venta libre y los recetados solamente como se lo haya indicado el mdico.  Use un  humidificador de aire fro para que el aire de su casa est ms hmedo. Esto puede facilitar la respiracin. ? Cuando utilice un humidificador de vapor fro, lmpielo a diario. Vace el agua y [place] por agua limpia.  Al toser o estornudar, cbrase la boca y la [place].  Lvese las manos frecuentemente con agua y [place] y durante al menos 20 segundos. Esto tambin es importante despus de toser o Brewing technologist. Si no dispone de [place] y [place], use desinfectante para manos con alcohol.  Cumpla con todas las visitas de seguimiento.      Cmo se previene?  Colquese la vacuna antigripal todos los [place]. Puede colocarse la vacuna contra la gripe a fines de verano, en otoo o en invierno. Pregntele al mdico cundo debe aplicarse la vacuna contra la gripe.  Evite el contacto con personas que estn enfermas durante el otoo y el invierno. Es la temporada del resfro y Counsellor.   Comunquese con un mdico si:  Tiene sntomas nuevos.  Tiene los siguientes sntomas: ? Dolor de Engineer, building services. ? Materia fecal lquida (diarrea). ? Fiebre.  La tos empeora.  Empieza a tener ms mucosidad.  Tiene Higher education careers adviser.  Vomita. Solicite ayuda de inmediato si:  Le falta el aire.  Tiene dificultad para respirar.  La piel o las uas se ponen de un color azulado.  Presenta dolor muy intenso o rigidez en el cuello.  Tiene dolor de cabeza repentino.  Le duele la cara o el odo de forma repentina.  No puede comer ni beber sin vomitar. Estos sntomas pueden representar un problema grave que constituye Engineer, maintenance (IT). Solicite atencin mdica de inmediato. Comunquese con el servicio de emergencias de su localidad (911 en los [place]).  No espere a ver si los sntomas desaparecen.  No conduzca por sus propios medios [employer]. Resumen  A la gripe tambin se la conoce como "influenza". Es una [employer], la nariz y Patent examiner. Se transmite fcilmente de [place] persona a  otra.  Use los medicamentos de venta libre y los recetados solamente como se lo haya indicado el mdico.  Aplicarse la vacuna contra la gripe todos los aos es la mejor manera de no contagiarse la gripe. Esta informacin no tiene Marine scientist el consejo del mdico. Asegrese de hacerle al mdico cualquier pregunta que tenga. Document Revised: 04/16/2020 Document Reviewed: 04/16/2020 Elsevier Patient Education  2021 [employer].

## 2020-09-01 NOTE — Progress Notes (Signed)
Subjective:  Patient ID: Julie Robertson, female    DOB: [birth year]  Age: 45 y.o. MRN: [id number]  CC: Diabetes   HPI Julie Robertson  Is 45 year old Hispanic female (interputor Elita Quick [id number]) presents forFollow-up of diabetes. Patient does not check blood sugar at home  Compliant with meds - No Checking CBGs? No Exercising regularly? - No Watching carbohydrate intake? - No Neuropathy ? - Yes Hypoglycemic events - No  Pertinent ROS:  Polyuria - No Polydipsia - Yes Vision problems - No  Medications as noted below. Taking them regularly without complication/adverse reaction being reported today.   History Kwana has a past medical history of Anxiety, Asthma, Diabetes mellitus without complication ([place]), Diabetic neuropathy ([place]) (12/2013), Dyslipidemia (08/2009), Dyspnea, Gall stones (08/2009), GERD (gastroesophageal reflux disease), and Obesity.   She has a past surgical history that includes Tubal ligation; Cholecystectomy (N/A, 03/10/2015); ERCP (N/A, 03/11/2015); Esophagogastroduodenoscopy (egd) with propofol (N/A, 07/25/2018); Transduodenal Ampullectomy (09/2015); Ventral hernia repair (N/A, 05/09/2019); Insertion of mesh (N/A, 28/09/6627); and Application if wound vac (N/A, 05/09/2019).   Her family history includes Bone cancer in her maternal grandfather; Diabetes in her father and mother; Heart disease in her mother; Hypertension in her mother; Kidney disease in her maternal uncle; Other in her son.She reports that she has been smoking cigarettes. She has a 6.00 pack-year smoking history. She has never used smokeless tobacco. She reports current alcohol use. She reports that she does not use drugs.  Current Outpatient Medications on File Prior to Visit  Medication Sig Dispense Refill  . albuterol (VENTOLIN HFA) 108 (90 Base) MCG/ACT inhaler Inhale 2 puffs into the lungs every 6 (six) hours as needed for wheezing or shortness of breath. 1 Inhaler 1  . atorvastatin (LIPITOR)  40 MG tablet TAKE 1 TABLET (40 MG TOTAL) BY MOUTH AT BEDTIME. 30 tablet 0  . Blood Glucose Monitoring Suppl (TRUE METRIX METER) w/Device KIT Use as directed 1 kit 0  . gabapentin (NEURONTIN) 300 MG capsule TAKE 1 CAPSULE (300 MG TOTAL) BY MOUTH 3 (THREE) TIMES DAILY. 90 capsule 0  . glipiZIDE (GLUCOTROL) 10 MG tablet TAKE 1 TABLET (10 MG TOTAL) BY MOUTH DAILY BEFORE BREAKFAST. 30 tablet 0  . glucose blood (TRUE METRIX BLOOD GLUCOSE TEST) test strip Use as instructed 100 each 12  . ibuprofen (ADVIL) 800 MG tablet Take 1 tablet (800 mg total) by mouth every 8 (eight) hours as needed. 30 tablet 0  . insulin lispro (HUMALOG KWIKPEN) 100 UNIT/ML KwikPen INJECT 12 UNITS INTO THE SKIN 3 TIMES DAILY.4 UNITS 200-250,6UNITS251-300,8UNITS 301-350,10 UNITS 351-400,12 UNITS 401-450(CALL IF BS>450), 15 mL 11  . Insulin Pen Needle 31G X 5 MM MISC Inject 10 units subcutaneous at bedtime 100 each 0  . LANTUS SOLOSTAR 100 UNIT/ML Solostar Pen INJECT 60 UNITS INTO THE SKIN AT BEDTIME. 15 mL 0  . losartan (COZAAR) 25 MG tablet Take 1 tablet (25 mg total) by mouth daily. 90 tablet 3  . nitrofurantoin, macrocrystal-monohydrate, (MACROBID) 100 MG capsule Take 1 capsule (100 mg total) by mouth 2 (two) times daily. 10 capsule 0  . omeprazole (PRILOSEC) 20 MG capsule TAKE 1 CAPSULE (20 MG TOTAL) BY MOUTH DAILY. 90 capsule 2  . polyethylene glycol (MIRALAX / GLYCOLAX) 17 g packet Take 17 g by mouth daily. (Patient not taking: Reported on 09/01/2020) 14 each 0   No current facility-administered medications on file prior to visit.    ROS Review of Systems Pertinent positive and negative noted in HPI Objective:  BP 100/70 (BP Location: Right Arm, Patient Position: Sitting, Cuff Size: Large)   Pulse 89   Temp (!) 97.5 F (36.4 C) (Temporal)   Ht 5' 5"  (1.651 m)   Wt 266 lb 3.2 oz (120.7 kg)   SpO2 96%   BMI 44.30 kg/m   BP Readings from Last 3 Encounters:  09/01/20 100/70  03/23/20 112/75  01/21/20 (!) 146/76     Wt Readings from Last 3 Encounters:  09/01/20 266 lb 3.2 oz (120.7 kg)  03/23/20 268 lb (121.6 kg)  01/21/20 268 lb (121.6 kg)    Physical Exam Vitals reviewed.  Constitutional:      Appearance: She is obese.     Comments: morbid  HENT:     Head: Normocephalic.     Nose: Nose normal.  Cardiovascular:     Rate and Rhythm: Normal rate and regular rhythm.  Pulmonary:     Effort: Pulmonary effort is normal.     Breath sounds: Normal breath sounds.  Abdominal:     General: Abdomen is flat. Bowel sounds are normal.     Palpations: Abdomen is soft.  Musculoskeletal:        General: Normal range of motion.     Cervical back: Normal range of motion and neck supple.  Skin:    General: Skin is warm and dry.  Neurological:     Mental Status: She is alert and oriented to person, place, and time.  Psychiatric:        Mood and Affect: Mood normal.        Behavior: Behavior normal.        Thought Content: Thought content normal.        Judgment: Judgment normal.     Lab Results  Component Value Date   HGBA1C 12.6 (A) 09/01/2020   HGBA1C 11.8 (A) 01/21/2020   HGBA1C 9.2 (A) 06/25/2019    Lab Results  Component Value Date   WBC 12.6 (H) 05/10/2019   HGB 11.3 (L) 05/10/2019   HCT 36.6 05/10/2019   PLT 229 05/10/2019   GLUCOSE 218 (H) 05/10/2019   CHOL 141 03/26/2019   TRIG 160 (H) 03/26/2019   HDL 37 (L) 03/26/2019   LDLCALC 76 03/26/2019   ALT 43 (H) 03/26/2019   AST 31 03/26/2019   NA 140 05/10/2019   K 4.7 05/10/2019   CL 111 05/10/2019   CREATININE 0.84 05/10/2019   BUN 12 05/10/2019   CO2 22 05/10/2019   TSH 3.090 03/22/2018   HGBA1C 12.6 (A) 09/01/2020   MICROALBUR 2.9 08/25/2016     Assessment & Plan:   Jyra was seen today for diabetes.  Diagnoses and all orders for this visit:  Uncontrolled type 2 diabetes mellitus with hyperglycemia, with long-term current use of insulin (HCC) -     Glucose (CBG) -     HgB A1c today 12.6 and 7 months ago  11.8 Discussed  co- morbidities with uncontrol diabetes  Complications -diabetic retinopathy, (close your eyes ? What do you see nothing) nephropathy decrease in kidney function- can lead to dialysis-on a machine 3 days a week to filter your kidney, neuropathy- numbness and tinging in your hands and feet,  increase risk of heart attack and stroke, and amputation due to decrease wound healing and circulation. Decrease your risk by taking medication daily as prescribed, monitor carbohydrates- foods that are high in carbohydrates are the following rice, potatoes, breads, sugars, and pastas.  Reduction in the intake (eating) will assist in lowering  your blood sugars. Exercise daily at least 30 minutes daily.  Need for immunization against influenza -     Flu Vaccine QUAD 36+ mos IM  Mixed hyperlipidemia Prescribed cholesterol that can lead to heart attack and stroke. To lower your number you can decrease your fatty foods, red meat, cheese, milk and increase fiber like whole grains and veggies. You can also add a fiber supplement like Metamucil or Benefiber.   Essential hypertension Bp well controlled on Losartan 63m daily for Bp management and renal protection  I have discontinued YJamelle HaringSoberanis Ramos's TRUEplus Lancets 28G, docusate sodium, phenazopyridine, and metFORMIN. I am also having her maintain her Insulin Pen Needle, True Metrix Meter, glucose blood, albuterol, polyethylene glycol, ibuprofen, losartan, insulin lispro, nitrofurantoin (macrocrystal-monohydrate), omeprazole, gabapentin, Lantus SoloStar, glipiZIDE, and atorvastatin.  No orders of the defined types were placed in this encounter.    Follow-up:   No follow-ups on file.  The above assessment and management plan was discussed with the patient. The patient verbalized understanding of and has agreed to the management plan. Patient is aware to call the clinic if symptoms fail to improve or worsen. Patient is aware when to return  to the clinic for a follow-up visit. Patient educated on when it is appropriate to go to the emergency department.   MJuluis Mire NP-C

## 2020-09-06 ENCOUNTER — Encounter (INDEPENDENT_AMBULATORY_CARE_PROVIDER_SITE_OTHER): Payer: Self-pay | Admitting: Primary Care

## 2020-09-06 DIAGNOSIS — J810 Acute pulmonary edema: Secondary | ICD-10-CM | POA: Insufficient documentation

## 2020-09-06 DIAGNOSIS — C249 Malignant neoplasm of biliary tract, unspecified: Secondary | ICD-10-CM | POA: Insufficient documentation

## 2020-09-10 ENCOUNTER — Other Ambulatory Visit: Payer: Self-pay

## 2020-09-10 ENCOUNTER — Other Ambulatory Visit (HOSPITAL_COMMUNITY)
Admission: RE | Admit: 2020-09-10 | Discharge: 2020-09-10 | Disposition: A | Discharge status: Home / Self Care | Payer: Self-pay | Source: Ambulatory Visit | Attending: Primary Care | Admitting: Primary Care

## 2020-09-10 ENCOUNTER — Encounter (INDEPENDENT_AMBULATORY_CARE_PROVIDER_SITE_OTHER): Payer: Self-pay | Admitting: Primary Care

## 2020-09-10 ENCOUNTER — Ambulatory Visit (INDEPENDENT_AMBULATORY_CARE_PROVIDER_SITE_OTHER): Payer: Self-pay | Admitting: Primary Care

## 2020-09-10 ENCOUNTER — Other Ambulatory Visit: Payer: Self-pay | Admitting: Primary Care

## 2020-09-10 VITALS — BP 127/83 | HR 81 | Temp 97.5°F | Ht 65.0 in | Wt 269.4 lb

## 2020-09-10 DIAGNOSIS — K649 Unspecified hemorrhoids: Secondary | ICD-10-CM

## 2020-09-10 DIAGNOSIS — N898 Other specified noninflammatory disorders of vagina: Secondary | ICD-10-CM

## 2020-09-10 DIAGNOSIS — Z124 Encounter for screening for malignant neoplasm of cervix: Secondary | ICD-10-CM

## 2020-09-10 DIAGNOSIS — K5900 Constipation, unspecified: Secondary | ICD-10-CM

## 2020-09-10 DIAGNOSIS — E782 Mixed hyperlipidemia: Secondary | ICD-10-CM

## 2020-09-10 MED ORDER — SORBITOL 70 % SOLN: 30.0000 mL | Freq: Every day | 0 refills | Status: DC | PRN

## 2020-09-10 MED ORDER — HYDROCORTISONE ACETATE 25 MG RE SUPP: 25.0000 mg | Freq: Two times a day (BID) | RECTAL | 0 refills | Status: DC

## 2020-09-10 MED FILL — SPS 15 GM/60 ML SUSPENSION: 15 | 15 days supply | Qty: 473 | Fill #0

## 2020-09-10 MED FILL — HYDROCORTISONE ACETATE 25 M: 25 | 6 days supply | Qty: 12 | Fill #0

## 2020-09-10 NOTE — Patient Instructions (Signed)
Prueba de Papanicolaou Pap Test Por qu me debo realizar esta prueba? La prueba de Papanicolaou, tambin denominada citologa vaginal, es una prueba de cribado para Hydrographic surveyor signos de:  Cncer de la vagina, del cuello uterino y del tero. El cuello uterino es la parte baja del tero que se abre hacia la vagina.  Infeccin.  Cambios que podran ser un signo de que se est desarrollando un cncer (cambios precancerosos). Las mujeres deben realizarse esta prueba con regularidad. En general, debe hacerse una prueba de Papanicolaou cada 3 aos hasta alcanzar la menopausia o hasta los 65 aos. Las [employer] 30 y 59 aos de edad pueden elegir realizarse la prueba de Papanicolaou al mismo tiempo que la prueba del VPH (virus del papiloma humano) cada 5 aos (en lugar de cada 3 aos). El mdico puede recomendarle que se realice pruebas de Papanicolaou con ms o menos frecuencia en funcin de sus afecciones mdicas y los resultados de la prueba de Papanicolaou anterior. Qu tipo de [place] se toma? El mdico recolectar una muestra de clulas de la superficie del cuello uterino. Lo har utilizando un pequeo hisopo de algodn, una esptula de plstico o un cepillo. Esta muestra se recolecta durante un examen plvico, mientras usted est recostada boca arriba sobre la mesa de examen con los pies en los descansos para pies (estribos). En algunos casos, tambin pueden recolectarse fluidos (secreciones) del cuello uterino y la vagina.   Cmo debo prepararme para este anlisis?  Tenga en cuenta en qu etapa del ciclo menstrual se encuentra. Es posible que se le pida que vuelva a Risk manager la prueba si est Forensic psychologist en que debe Radiation protection practitioner.  Si el da en que debe realizarse la prueba tiene una infeccin vaginal aparente, deber volver a Editor, commissioning prueba.  Siga las instrucciones del mdico acerca de lo siguiente: ? Quarry manager o suspender los medicamentos que [place] habitualmente. Algunos  medicamentos pueden [employer] de la prueba, como los digitlicos y Lexicographer. ? Evite las duchas vaginales o los baos de inmersin el da de la prueba o Games developer anterior. Informe al mdico acerca de lo siguiente:  Cualquier alergia que tenga.  Todos los [employer] [place], incluidos vitaminas, hierbas, gotas oftlmicas, cremas y medicamentos de venta libre.  Cualquier trastorno de la sangre que tenga.  Cirugas a las que se haya sometido.  Cualquier afeccin mdica que tenga.  Si est embarazada o podra estarlo. Cmo se informan los resultados? Los [employer] de la prueba se informarn como anormales o normales. Puede producirse un resultado positivo falso. Este tipo de resultado es incorrecto porque indica que una afeccin est presente cuando en realidad no lo est. Puede producirse un resultado negativo falso. Este tipo de resultado es incorrecto porque indica que una afeccin no est presente cuando en realidad lo est. Qu significan los [place]? Un resultado normal en la prueba significa que no tiene signos de cncer de la vagina, del cuello uterino o del tero. Un resultado anormal puede significar que tiene:  Cncer. Una prueba de Papanicolaou por s sola no es suficiente para Community education officer. En este caso, se le realizarn ms pruebas.  Cambios precancerosos en la vagina, cuello uterino o tero.  Inflamacin del cuello uterino.  Enfermedades de transmisin sexual (ETS).  Infecciones por hongos.  Infecciones por parsitos. Hable con su mdico sobre lo que significan sus [place]. Preguntas para hacerle al mdico Consulte a su mdico o pregunte en el departamento donde se realiza la prueba acerca de  lo siguiente:  Cundo estarn disponibles mis resultados?  Cmo obtendr mis resultados?  Cules son las opciones de tratamiento?  Qu otras pruebas necesito?  Cules son los prximos pasos que debo seguir? Resumen  En  general, las mujeres deben hacerse una prueba de Papanicolaou cada 3 aos Teacher, English as a foreign language la menopausia o [employer] 71 aos de [place].  El mdico recolectar una muestra de clulas de la superficie del cuello uterino. Lo har utilizando un pequeo hisopo de algodn, una esptula de plstico o un cepillo.  En algunos casos, tambin pueden recolectarse fluidos (secreciones) del cuello uterino y la vagina. Esta informacin no tiene Marine scientist el consejo del mdico. Asegrese de hacerle al mdico cualquier pregunta que tenga. Document Revised: 04/14/2020 Document Reviewed: 04/14/2020 Elsevier Patient Education  2021 [employer].

## 2020-09-10 NOTE — Progress Notes (Signed)
[facility] Patient name: Julie Robertson MRN [id number]  Date of birth: February 26, 1976 Chief Complaint:   Gynecologic Exam  History of Present Illness:   Julie Robertson is a 45 y.o. No obstetric history on file. Hispanicmorbid obese c female being seen today for a routine well-woman exam. Julie Robertson [id number] Current complaints: Vaginal discharge  PCP: Julie Robertson   does not desire labs Patient's last menstrual period was 08/20/2020 (approximate). The current method of family planning is  Tubal ligation .  Last pap unknown  Results were: normal Last mammogram: none. Results were: normal. Family h/o breast cancer: No  Review of Systems:   Pertinent items are noted in HPI Denies any headaches, blurred vision, fatigue, shortness of breath, chest pain, abdominal pain, abnormal vaginal discharge/itching/odor/irritation, problems with periods, bowel movements, urination, or intercourse unless otherwise stated above. Pertinent History Reviewed:  Reviewed past medical,surgical, social and family history.  Reviewed problem list, medications and allergies. Physical Assessment:   Vitals:   09/10/20 0841  BP: 127/83  Pulse: 81  Temp: (!) 97.5 F (36.4 C)  TempSrc: Temporal  SpO2: 100%  Weight: 269 lb 6.4 oz (122.2 kg)  Height: 5\' 5"  (1.651 m)  Body mass index is 44.83 kg/m.        Physical Examination:   General appearance - well appearing, and in no distress morbid obese female   Mental status - alert, oriented to person, place, and time  Psych:  She has a normal mood and affect  Skin - warm and dry, normal color, no suspicious lesions noted  Chest - effort normal, all lung fields clear to auscultation bilaterally  Heart - normal rate and regular rhythm  Neck:  midline trachea, no thyromegaly or nodules  Breasts - Taught SBE  Abdomen - soft, nontender, nondistended, no masses or organomegaly  Pelvic - VULVA:  normal appearing vulva with no masses, tenderness or lesions  VAGINA: normal appearing vagina with normal color and discharge, no lesions  CERVIX: normal appearing cervix without discharge or lesions, no CMT  Thin prep pap is done UTERUS: uterus is felt to be normal size, shape, consistency and nontender   ADNEXA: No adnexal masses or tenderness noted.  Extremities:  No swelling or varicosities noted  No results found for this or any previous visit (from the past 24 hour(s)).  Assessment & Plan:   Well-Woman Exam with Pap  Julie Robertson was seen today for gynecologic exam.  Diagnoses and all orders for this visit:  Cervical cancer screening -     Cytology - PAP([place])  Mixed hyperlipidemia -     Lipid panel  Vaginal discharge -     Cervicovaginal ancillary only  Hemorrhoids, unspecified hemorrhoid type -     hydrocortisone (ANUSOL-HC) 25 MG suppository; Place 1 suppository (25 mg total) rectally 2 (two) times daily.  Constipation, unspecified constipation type -     sorbitol 70 % SOLN; Take 30 mLs by mouth daily as needed for moderate constipation.   Labs/procedures today:fasting lipids    Follow-up: Return for keep schedule appt for DM.  Julie Robertson 09/10/2020

## 2020-09-11 ENCOUNTER — Other Ambulatory Visit (INDEPENDENT_AMBULATORY_CARE_PROVIDER_SITE_OTHER): Payer: Self-pay | Admitting: Primary Care

## 2020-09-11 ENCOUNTER — Telehealth (INDEPENDENT_AMBULATORY_CARE_PROVIDER_SITE_OTHER): Payer: Self-pay

## 2020-09-11 LAB — LIPID PANEL
Chol/HDL Ratio: 5.1 ratio — ABNORMAL HIGH (ref 0.0–4.4)
Cholesterol, Total: 147 mg/dL (ref 100–199)
HDL: 29 mg/dL — ABNORMAL LOW (ref >39)
LDL Chol Calc (NIH): 73 mg/dL (ref 0–99)
Triglycerides: 277 mg/dL — ABNORMAL HIGH (ref 0–149)
VLDL Cholesterol Cal: 45 mg/dL — ABNORMAL HIGH (ref 5–40)

## 2020-09-11 MED ORDER — FENOFIBRATE 145 MG PO TABS: 145.0000 mg | ORAL_TABLET | Freq: Every day | ORAL | 1 refills | Status: DC

## 2020-09-11 MED FILL — ?FENOFIBRATE 145 MG TABLET: 145 | 30 days supply | Qty: 30 | Fill #0

## 2020-09-11 NOTE — Telephone Encounter (Signed)
-----   Message from Kerin Perna, NP sent at 09/11/2020  9:38 AM EST -----  Cholesterol  is elevated and this can lead to heart attack and stroke. decrease your fatty foods, red meat, cheese, milk and increase fiber like whole grains and veggies. On atorvastatin 40 take at bed time and added tricor for  Triglycerides

## 2020-09-11 NOTE — Telephone Encounter (Signed)
Patient returned call and was informed of results with assistance of bilingual front office representative. Patient expressed understanding of results. Nat Christen, CMA

## 2020-09-13 LAB — CERVICOVAGINAL ANCILLARY ONLY
Bacterial Vaginitis (gardnerella): NEGATIVE
Candida Glabrata: POSITIVE — AB
Candida Vaginitis: NEGATIVE
Chlamydia: NEGATIVE
Comment: NEGATIVE
Comment: NEGATIVE
Comment: NEGATIVE
Comment: NEGATIVE
Comment: NEGATIVE
Comment: NORMAL
Neisseria Gonorrhea: NEGATIVE
Trichomonas: NEGATIVE

## 2020-09-15 LAB — CYTOLOGY - PAP
Adequacy: ABSENT
Diagnosis: NEGATIVE

## 2020-09-29 MED FILL — ?BASAGLAR 100 UNITS/ML KWPE: 100 | 25 days supply | Qty: 15 | Fill #0

## 2020-09-29 MED FILL — glipiZIDE 10 MG TABS: 10 | 30 days supply | Qty: 60 | Fill #0

## 2020-09-29 MED FILL — ?OMEPRAZOLE 20 MG CPDR: 20 | 30 days supply | Qty: 30 | Fill #5

## 2020-09-29 MED FILL — ?ATORVASTATIN 40MG TABLET: 40 | 30 days supply | Qty: 30 | Fill #0

## 2020-09-29 MED FILL — LOSARTAN POTASSIUM 25 MG TA: 25 | 30 days supply | Qty: 30 | Fill #5

## 2020-09-29 MED FILL — metFORMIN HCL ER 500 MG TB2: 500 | 30 days supply | Qty: 120 | Fill #1

## 2020-10-03 ENCOUNTER — Other Ambulatory Visit: Payer: Self-pay

## 2020-10-28 ENCOUNTER — Other Ambulatory Visit (INDEPENDENT_AMBULATORY_CARE_PROVIDER_SITE_OTHER): Payer: Self-pay | Admitting: Primary Care

## 2020-10-28 ENCOUNTER — Other Ambulatory Visit: Payer: Self-pay

## 2020-10-28 DIAGNOSIS — Z794 Long term (current) use of insulin: Secondary | ICD-10-CM

## 2020-10-28 DIAGNOSIS — E1165 Type 2 diabetes mellitus with hyperglycemia: Secondary | ICD-10-CM

## 2020-10-28 MED FILL — Losartan Potassium Tab 25 MG: ORAL | 30 days supply | Qty: 30 | Fill #0 | Status: AC

## 2020-10-28 MED FILL — Insulin Glargine Soln Pen-Injector 100 Unit/ML: SUBCUTANEOUS | 25 days supply | Qty: 15 | Fill #0 | Status: AC

## 2020-10-28 MED FILL — Atorvastatin Calcium Tab 40 MG (Base Equivalent): ORAL | 30 days supply | Qty: 30 | Fill #0 | Status: AC

## 2020-10-28 MED FILL — Omeprazole Cap Delayed Release 20 MG: ORAL | 30 days supply | Qty: 30 | Fill #0 | Status: AC

## 2020-10-28 MED FILL — Glipizide Tab 10 MG: ORAL | 30 days supply | Qty: 60 | Fill #0 | Status: AC

## 2020-10-28 MED FILL — Fenofibrate Tab 145 MG: ORAL | 30 days supply | Qty: 30 | Fill #0 | Status: AC

## 2020-10-29 ENCOUNTER — Other Ambulatory Visit: Payer: Self-pay

## 2020-10-29 MED FILL — Metformin HCl Tab 1000 MG: ORAL | 30 days supply | Qty: 60 | Fill #0 | Status: AC

## 2020-10-29 NOTE — Telephone Encounter (Signed)
Patient called and I connected with interpreter services. Patient asked if she should be taking both the atorvastatin and tricor. Per nurses last note I advised her that she was supposed to be taking both medications. Patient understood.

## 2020-11-26 ENCOUNTER — Other Ambulatory Visit: Payer: Self-pay

## 2020-11-26 MED FILL — Atorvastatin Calcium Tab 40 MG (Base Equivalent): ORAL | 30 days supply | Qty: 30 | Fill #1 | Status: AC

## 2020-11-26 MED FILL — Omeprazole Cap Delayed Release 20 MG: ORAL | 30 days supply | Qty: 30 | Fill #1 | Status: AC

## 2020-11-26 MED FILL — Losartan Potassium Tab 25 MG: ORAL | 30 days supply | Qty: 30 | Fill #1 | Status: AC

## 2020-11-26 MED FILL — Insulin Glargine Soln Pen-Injector 100 Unit/ML: SUBCUTANEOUS | 25 days supply | Qty: 15 | Fill #1 | Status: AC

## 2020-11-26 MED FILL — Glipizide Tab 10 MG: ORAL | 30 days supply | Qty: 60 | Fill #1 | Status: AC

## 2020-11-26 MED FILL — Metformin HCl Tab 1000 MG: ORAL | 30 days supply | Qty: 60 | Fill #1 | Status: AC

## 2020-11-26 MED FILL — Fenofibrate Tab 145 MG: ORAL | 30 days supply | Qty: 30 | Fill #1 | Status: AC

## 2020-11-27 ENCOUNTER — Other Ambulatory Visit: Payer: Self-pay

## 2020-12-02 ENCOUNTER — Ambulatory Visit (INDEPENDENT_AMBULATORY_CARE_PROVIDER_SITE_OTHER): Payer: Self-pay | Admitting: Primary Care

## 2020-12-24 ENCOUNTER — Other Ambulatory Visit: Payer: Self-pay

## 2020-12-24 MED FILL — Insulin Pen Needle 32 G X 4 MM (1/6" or 5/32"): 25 days supply | Qty: 100 | Fill #0 | Status: CN

## 2020-12-24 MED FILL — Glipizide Tab 10 MG: ORAL | 30 days supply | Qty: 60 | Fill #2 | Status: AC

## 2020-12-24 MED FILL — Omeprazole Cap Delayed Release 20 MG: ORAL | 30 days supply | Qty: 30 | Fill #2 | Status: AC

## 2020-12-24 MED FILL — Metformin HCl Tab 1000 MG: ORAL | 30 days supply | Qty: 60 | Fill #2 | Status: AC

## 2020-12-24 MED FILL — Losartan Potassium Tab 25 MG: ORAL | 30 days supply | Qty: 30 | Fill #2 | Status: AC

## 2020-12-24 MED FILL — Atorvastatin Calcium Tab 40 MG (Base Equivalent): ORAL | 30 days supply | Qty: 30 | Fill #2 | Status: CN

## 2020-12-24 MED FILL — Fenofibrate Tab 145 MG: ORAL | 30 days supply | Qty: 30 | Fill #2 | Status: AC

## 2020-12-24 MED FILL — Insulin Glargine Soln Pen-Injector 100 Unit/ML: SUBCUTANEOUS | 25 days supply | Qty: 15 | Fill #2 | Status: AC

## 2020-12-24 MED FILL — Atorvastatin Calcium Tab 40 MG (Base Equivalent): ORAL | 30 days supply | Qty: 30 | Fill #2 | Status: AC

## 2021-01-26 ENCOUNTER — Other Ambulatory Visit: Payer: Self-pay

## 2021-01-26 MED FILL — Losartan Potassium Tab 25 MG: ORAL | 30 days supply | Qty: 30 | Fill #0 | Status: AC

## 2021-01-26 MED FILL — Atorvastatin Calcium Tab 40 MG (Base Equivalent): ORAL | 30 days supply | Qty: 30 | Fill #3 | Status: AC

## 2021-01-26 MED FILL — Glipizide Tab 10 MG: ORAL | 30 days supply | Qty: 60 | Fill #3 | Status: AC

## 2021-01-26 MED FILL — Insulin Glargine Soln Pen-Injector 100 Unit/ML: SUBCUTANEOUS | 25 days supply | Qty: 15 | Fill #3 | Status: CN

## 2021-01-26 MED FILL — Fenofibrate Tab 145 MG: ORAL | 30 days supply | Qty: 30 | Fill #3 | Status: AC

## 2021-01-26 MED FILL — Metformin HCl Tab 1000 MG: ORAL | 30 days supply | Qty: 60 | Fill #3 | Status: AC

## 2021-01-27 ENCOUNTER — Other Ambulatory Visit: Payer: Self-pay

## 2021-01-27 ENCOUNTER — Other Ambulatory Visit (INDEPENDENT_AMBULATORY_CARE_PROVIDER_SITE_OTHER): Payer: Self-pay | Admitting: Primary Care

## 2021-01-27 MED ORDER — OMEPRAZOLE 20 MG PO CPDR
20.0000 mg | DELAYED_RELEASE_CAPSULE | Freq: Every day | ORAL | 0 refills | Status: DC
  Filled 2021-01-27: qty 30, 30d supply, fill #0
  Filled 2021-02-25: qty 30, 30d supply, fill #1
  Filled 2021-03-25: qty 30, 30d supply, fill #2

## 2021-01-27 MED FILL — Insulin Glargine Soln Pen-Injector 100 Unit/ML: SUBCUTANEOUS | 30 days supply | Qty: 18 | Fill #3 | Status: AC

## 2021-02-25 ENCOUNTER — Other Ambulatory Visit: Payer: Self-pay

## 2021-02-25 MED FILL — Losartan Potassium Tab 25 MG: ORAL | 30 days supply | Qty: 30 | Fill #1 | Status: AC

## 2021-02-25 MED FILL — Metformin HCl Tab 1000 MG: ORAL | 30 days supply | Qty: 60 | Fill #4 | Status: AC

## 2021-02-25 MED FILL — Atorvastatin Calcium Tab 40 MG (Base Equivalent): ORAL | 30 days supply | Qty: 30 | Fill #4 | Status: AC

## 2021-02-25 MED FILL — Glipizide Tab 10 MG: ORAL | 30 days supply | Qty: 60 | Fill #4 | Status: AC

## 2021-02-25 MED FILL — Insulin Glargine Soln Pen-Injector 100 Unit/ML: SUBCUTANEOUS | 30 days supply | Qty: 18 | Fill #4 | Status: AC

## 2021-02-25 MED FILL — Fenofibrate Tab 145 MG: ORAL | 30 days supply | Qty: 30 | Fill #4 | Status: AC

## 2021-02-26 ENCOUNTER — Other Ambulatory Visit: Payer: Self-pay

## 2021-02-26 MED FILL — Insulin Pen Needle 32 G X 4 MM (1/6" or 5/32"): 25 days supply | Qty: 100 | Fill #0 | Status: CN

## 2021-03-25 ENCOUNTER — Other Ambulatory Visit: Payer: Self-pay

## 2021-03-25 ENCOUNTER — Other Ambulatory Visit (INDEPENDENT_AMBULATORY_CARE_PROVIDER_SITE_OTHER): Payer: Self-pay | Admitting: Primary Care

## 2021-03-25 DIAGNOSIS — E782 Mixed hyperlipidemia: Secondary | ICD-10-CM

## 2021-03-25 DIAGNOSIS — E1165 Type 2 diabetes mellitus with hyperglycemia: Secondary | ICD-10-CM

## 2021-03-25 DIAGNOSIS — Z794 Long term (current) use of insulin: Secondary | ICD-10-CM

## 2021-03-25 MED FILL — Losartan Potassium Tab 25 MG: ORAL | 30 days supply | Qty: 30 | Fill #2 | Status: AC

## 2021-03-25 MED FILL — Metformin HCl Tab 1000 MG: ORAL | 30 days supply | Qty: 60 | Fill #5 | Status: AC

## 2021-03-25 NOTE — Telephone Encounter (Signed)
Sent to PCP ?

## 2021-03-26 ENCOUNTER — Other Ambulatory Visit: Payer: Self-pay

## 2021-03-28 ENCOUNTER — Other Ambulatory Visit (INDEPENDENT_AMBULATORY_CARE_PROVIDER_SITE_OTHER): Payer: Self-pay | Admitting: Primary Care

## 2021-03-28 MED ORDER — ATORVASTATIN CALCIUM 40 MG PO TABS
ORAL_TABLET | Freq: Every day | ORAL | 0 refills | Status: DC
  Filled 2021-03-28 – 2021-04-12 (×2): qty 30, 30d supply, fill #0

## 2021-03-28 MED ORDER — GLIPIZIDE 10 MG PO TABS
ORAL_TABLET | Freq: Two times a day (BID) | ORAL | 1 refills | Status: DC
  Filled 2021-03-28: qty 60, 30d supply, fill #0

## 2021-03-28 MED ORDER — BASAGLAR KWIKPEN 100 UNIT/ML ~~LOC~~ SOPN
PEN_INJECTOR | SUBCUTANEOUS | 6 refills | Status: DC
  Filled 2021-03-28: qty 15, 25d supply, fill #0

## 2021-03-28 MED ORDER — FENOFIBRATE 145 MG PO TABS
ORAL_TABLET | Freq: Every day | ORAL | 0 refills | Status: DC
  Filled 2021-03-28: qty 30, 30d supply, fill #0

## 2021-03-28 NOTE — Telephone Encounter (Signed)
Needs labs last lipids 3/22

## 2021-03-29 ENCOUNTER — Other Ambulatory Visit: Payer: Self-pay

## 2021-04-05 ENCOUNTER — Other Ambulatory Visit: Payer: Self-pay

## 2021-04-12 ENCOUNTER — Other Ambulatory Visit: Payer: Self-pay

## 2021-04-12 ENCOUNTER — Ambulatory Visit: Payer: Self-pay | Attending: Primary Care

## 2021-04-12 ENCOUNTER — Ambulatory Visit (INDEPENDENT_AMBULATORY_CARE_PROVIDER_SITE_OTHER): Payer: Self-pay | Admitting: Primary Care

## 2021-04-12 ENCOUNTER — Encounter (INDEPENDENT_AMBULATORY_CARE_PROVIDER_SITE_OTHER): Payer: Self-pay | Admitting: Primary Care

## 2021-04-12 VITALS — BP 133/79 | HR 81 | Temp 98.1°F | Ht 65.0 in | Wt 260.8 lb

## 2021-04-12 DIAGNOSIS — E782 Mixed hyperlipidemia: Secondary | ICD-10-CM

## 2021-04-12 DIAGNOSIS — R739 Hyperglycemia, unspecified: Secondary | ICD-10-CM

## 2021-04-12 DIAGNOSIS — E1165 Type 2 diabetes mellitus with hyperglycemia: Secondary | ICD-10-CM

## 2021-04-12 DIAGNOSIS — Z760 Encounter for issue of repeat prescription: Secondary | ICD-10-CM

## 2021-04-12 DIAGNOSIS — I10 Essential (primary) hypertension: Secondary | ICD-10-CM

## 2021-04-12 DIAGNOSIS — Z794 Long term (current) use of insulin: Secondary | ICD-10-CM

## 2021-04-12 LAB — POCT URINALYSIS DIP (CLINITEK)
Bilirubin, UA: NEGATIVE
Glucose, UA: >=1000 mg/dL — AB
Ketones, POC UA: NEGATIVE mg/dL
Leukocytes, UA: NEGATIVE
Nitrite, UA: NEGATIVE
POC PROTEIN,UA: NEGATIVE
Spec Grav, UA: 1.01 (ref 1.010–1.025)
Urobilinogen, UA: 0.2 E.U./dL
pH, UA: 5.5 (ref 5.0–8.0)

## 2021-04-12 LAB — POCT GLYCOSYLATED HEMOGLOBIN (HGB A1C): Hemoglobin A1C: 12.6 % — AB (ref 4.0–5.6)

## 2021-04-12 LAB — GLUCOSE, POCT (MANUAL RESULT ENTRY): POC Glucose: 318 mg/dl — AB (ref 70–99)

## 2021-04-12 MED ORDER — INSULIN LISPRO (1 UNIT DIAL) 100 UNIT/ML (KWIKPEN)
PEN_INJECTOR | SUBCUTANEOUS | 11 refills | Status: DC
  Filled 2021-04-12: qty 12, 33d supply, fill #0
  Filled 2021-05-12: qty 12, 33d supply, fill #1
  Filled 2021-08-16: qty 12, 33d supply, fill #0

## 2021-04-12 MED ORDER — GABAPENTIN 300 MG PO CAPS
300.0000 mg | ORAL_CAPSULE | Freq: Three times a day (TID) | ORAL | 1 refills | Status: DC
  Filled 2021-04-12: qty 90, 30d supply, fill #0
  Filled 2021-05-12: qty 90, 30d supply, fill #1

## 2021-04-12 MED ORDER — BASAGLAR KWIKPEN 100 UNIT/ML ~~LOC~~ SOPN
PEN_INJECTOR | SUBCUTANEOUS | 6 refills | Status: DC
  Filled 2021-04-12: qty 60, 100d supply, fill #0
  Filled 2021-07-13: qty 15, 25d supply, fill #0
  Filled 2021-08-17: qty 15, 25d supply, fill #1

## 2021-04-12 MED ORDER — METFORMIN HCL 1000 MG PO TABS
ORAL_TABLET | Freq: Two times a day (BID) | ORAL | 3 refills | Status: DC
  Filled 2021-04-12: qty 180, fill #0
  Filled 2021-05-13 (×2): qty 60, 30d supply, fill #0
  Filled 2021-06-10 (×2): qty 60, 30d supply, fill #1
  Filled 2021-07-13: qty 60, 30d supply, fill #0
  Filled 2021-08-16: qty 60, 30d supply, fill #1

## 2021-04-12 MED ORDER — GLIPIZIDE 10 MG PO TABS
ORAL_TABLET | Freq: Two times a day (BID) | ORAL | 1 refills | Status: DC
  Filled 2021-04-12: qty 60, 30d supply, fill #0
  Filled 2021-05-12: qty 60, 30d supply, fill #1
  Filled 2021-06-10: qty 60, 30d supply, fill #2
  Filled 2021-07-13: qty 60, 30d supply, fill #0
  Filled 2021-08-16: qty 60, 30d supply, fill #1

## 2021-04-12 MED ORDER — FENOFIBRATE 145 MG PO TABS
ORAL_TABLET | Freq: Every day | ORAL | 0 refills | Status: DC
  Filled 2021-04-12: qty 30, 30d supply, fill #0

## 2021-04-12 MED ORDER — GLUCOSE BLOOD VI STRP
ORAL_STRIP | 0 refills | Status: DC
  Filled 2021-04-12: qty 100, 25d supply, fill #0

## 2021-04-12 MED ORDER — TRUE METRIX METER W/DEVICE KIT
PACK | 0 refills | Status: DC
  Filled 2021-04-12: qty 1, 365d supply, fill #0

## 2021-04-12 MED ORDER — TRUEPLUS LANCETS 28G MISC
0 refills | Status: DC
  Filled 2021-04-12: qty 100, 25d supply, fill #0

## 2021-04-12 MED ORDER — LOSARTAN POTASSIUM 25 MG PO TABS
ORAL_TABLET | Freq: Every day | ORAL | 3 refills | Status: DC
  Filled 2021-04-12: qty 90, fill #0
  Filled 2021-05-12: qty 30, 30d supply, fill #0
  Filled 2021-06-10: qty 30, 30d supply, fill #1
  Filled 2021-07-13: qty 30, 30d supply, fill #0
  Filled 2021-08-16: qty 30, 30d supply, fill #1

## 2021-04-12 NOTE — Progress Notes (Signed)
[place]     Julie Robertson is a 45 y.o.obese Hispanic  female (interpreter Shellee Milo [phone] presents for an follow up evaluation of Type 2 diabetes mellitus.  Current symptoms/problems include polyuria and have been unchanged. Symptoms have been present for 2 months.  Current diabetic medications include   The patient was initially diagnosed with Type 2 diabetes mellitus based on the following criteria:  ADA.  Current monitoring regimen:  sometimes  Home blood sugar records:  unknown  Any episodes of hypoglycemia? no  Known diabetic complications: cardiovascular disease Cardiovascular risk factors: diabetes mellitus, dyslipidemia, hypertension, and obesity (BMI >= 30 kg/m2)   Eye exam current (within one year): no Weight trend: decreasing steadily down 9 lbs since last visit  Prior visit with CDE: yes -  Current diet: in general, an "unhealthy" diet 6 tortillas daily , rice and potatoes  Current exercise: walking Medication Compliance?  No out of medication. If she did not run out she takes medication as prescribed    Is She on ACE inhibitor or angiotensin II receptor blocker?  Yes  losartan (Cozaar)  The following portions of the patient's history were reviewed and updated as appropriate: allergies, current medications, past family history, past medical history, past social history, and past surgical history.  ROS  Objective:    BP 133/79 (BP Location: Right Arm, Patient Position: Sitting, Cuff Size: Normal)   Pulse 81   Temp 98.1 F (36.7 C) (Temporal)   Ht 5' 5"  (1.651 m)   Wt 260 lb 12.8 oz (118.3 kg)   SpO2 94%   BMI 43.40 kg/m   Physical Exam   Lab Review Glucose (mg/dL)  Date Value  03/26/2019 65  12/04/2018 229 (H)  03/22/2018 207 (H)   Glucose, Bld (mg/dL)  Date Value  05/10/2019 218 (H)  08/25/2016 153 (H)  05/14/2015 154 (H)   CO2 (mmol/L)  Date Value  05/10/2019 22  03/26/2019 23  12/04/2018 21   BUN  (mg/dL)  Date Value  05/10/2019 12  03/26/2019 12  03/12/2019 13  12/04/2018 16  03/22/2018 9   Creat (mg/dL)  Date Value  08/25/2016 0.69  12/17/2014 0.84  12/05/2013 0.69   Creatinine, Ser (mg/dL)  Date Value  05/10/2019 0.84  03/26/2019 0.68  03/12/2019 0.67      Assessment:    Diabetes Mellitus type II, under poor control.   Julie Robertson was seen today for diabetes.  Diagnoses and all orders for this visit:  Uncontrolled type 2 diabetes mellitus with hyperglycemia, with long-term current use of insulin (HCC)   HgB A1c 12.6 same as previously reading. Question understand you were out of medication for a month but should not be the same.-     Glucose (CBG) 318  1.  Rx changes: none  2.  Education: Reviewed 'ABCs' of diabetes management (respective goals in parentheses):  A1C (<7), blood pressure (<130/80), and cholesterol (LDL <100). Counseled Patient on the risk factors of co- morbidities with uncontrol diabetes  Complications -diabetic retinopathy, (close your eyes ? What do you see nothing) nephropathy decrease in kidney function- can lead to dialysis-on a machine 3 days a week to filter your kidney, neuropathy- numbness and tinging in your hands and feet,  increase risk of heart attack and stroke, and amputation due to decrease wound healing and circulation. Decrease your risk by taking medication daily as prescribed, monitor carbohydrates- foods that are high in carbohydrates are the following rice, potatoes, breads, sugars, and pastas.  Reduction in  the intake (eating) will assist in lowering your blood sugars. Exercise daily at least 30 minutes daily.            Agreed to reduce 6 tortillas daily to 3 , portion sizes of rice and foods cooked or made with flour/breads 3. CHO counting diet discussed.  Reviewed CHO amount in various foods and how to read nutrition labels.  Discussed recommended serving sizes.   4.  Recommend check BG 3  times a day  5. Recommended increase  physical activity - goal is 150 minutes per week  Uncontrolled type 2 diabetes mellitus with hyperglycemia, with long-term current use of insulin (HCC) -     HgB A1c -     Glucose (CBG) -     glipiZIDE (GLUCOTROL) 10 MG tablet; TAKE 1 TABLET (10 MG TOTAL) BY MOUTH 2 (TWO) TIMES DAILY BEFORE A MEAL. -     gabapentin (NEURONTIN) 300 MG capsule; Take 1 capsule (300 mg total) by mouth 3 (three) times daily.  Hyperglycemia -     POCT URINALYSIS DIP (CLINITEK) abnormal > glucose, no ketones , trace of blood  Essential hypertension Blood pressure is at goal of less than 130/80, low-sodium, DASH diet, medication compliance, 150 minutes of moderate intensity exercise per week. Discussed medication compliance, adverse effects.  -     losartan (COZAAR) 25 MG tablet; TAKE 1 TABLET (25 MG TOTAL) BY MOUTH DAILY. -     CMP14+EGFR; Future -     CBC with Differential/Platelet; Future  Mixed hyperlipidemia  Healthy lifestyle diet of fruits vegetables fish nuts whole grains and low saturated fat . Foods high in cholesterol or liver, fatty meats,cheese, butter avocados, nuts and seeds, chocolate and fried foods. -     Lipid panel; Future  Other orders/Medication refill -     Insulin Glargine (BASAGLAR KWIKPEN) 100 UNIT/ML; INJECT 60 UNITS INTO THE SKIN AT BEDTIME. -     insulin lispro (HUMALOG) 100 UNIT/ML KwikPen; INJECT 12 UNITS INTO THE SKIN 3 TIMES DAILY PER SLIDING SCALE -     fenofibrate (TRICOR) 145 MG tablet; TAKE 1 TABLET (145 MG TOTAL) BY MOUTH DAILY. -     metFORMIN (GLUCOPHAGE) 1000 MG tablet; TAKE 1 TABLET (1,000 MG TOTAL) BY MOUTH 2 (TWO) TIMES DAILY WITH A MEAL. -     Blood Glucose Monitoring Suppl (TRUE METRIX METER) w/Device KIT; use up to 4 times daily as directed    This note has been created with Surveyor, quantity. Any transcriptional errors are unintentional.   Kerin Perna, NP 04/12/2021, 3:37 PM

## 2021-04-12 NOTE — Patient Instructions (Signed)
Diabetes mellitus y nutricin, en adultos Diabetes Mellitus and Nutrition, Adult Si sufre de diabetes, o diabetes mellitus, es muy importante tener hbitos alimenticios saludables debido a que sus niveles de Designer, television/film set sangre (glucosa) se ven afectados en gran medida por lo que come y bebe. Comer alimentos saludables en las cantidades correctas, aproximadamente a la misma hora todos los [place], [place] ayudar a: Aeronautical engineer glucemia. Disminuir el riesgo de sufrir una enfermedad cardaca. Mejorar la presin arterial. Science writer o mantener un peso saludable. Qu puede afectar mi plan de alimentacin? Todas las personas que sufren de diabetes son diferentes y cada una tiene necesidades diferentes en cuanto a un plan de alimentacin. El mdico puede recomendarle que trabaje con un nutricionista para elaborar el mejor plan para usted. Su plan de alimentacin puede variar segn factores como: Las caloras que necesita. Los medicamentos que toma. Su peso. Sus niveles de glucemia, presin arterial y colesterol. Su nivel de [place]. Otras afecciones que tenga, como enfermedades cardacas o renales. Cmo me afectan los carbohidratos? Los carbohidratos, o hidratos de carbono, afectan su nivel de glucemia ms que cualquier otro tipo de alimento. La ingesta de carbohidratos naturalmente aumenta la cantidad de [employer]. El recuento de carbohidratos es un mtodo destinado a Catering manager un registro de la cantidad de carbohidratos que se consumen. El recuento de carbohidratos es importante para Theatre manager la glucemia a un nivel saludable, especialmente si utiliza insulina o toma determinados medicamentos por va oral para la diabetes. Es importante conocer la cantidad de carbohidratos que se pueden ingerir en cada comida sin correr Engineer, manufacturing. Esto es Psychologist, forensic. Su nutricionista puede ayudarlo a calcular la cantidad de carbohidratos que debe ingerir en cada comida y en cada refrigerio. Cmo  me afecta el alcohol? El alcohol puede provocar disminuciones sbitas de la glucemia (hipoglucemia), especialmente si utiliza insulina o toma determinados medicamentos por va oral para la diabetes. La hipoglucemia es una afeccin potencialmente mortal. Los sntomas de la hipoglucemia, como somnolencia, mareos y confusin, son similares a los sntomas de haber consumido demasiado alcohol. No beba alcohol si: Su mdico le indica no hacerlo. Est embarazada, puede estar embarazada o est tratando de [place]. Si bebe alcohol: No beba con el estmago vaco. Limite la cantidad que bebe: De 0 a 1 medida por da para las mujeres. De 0 a 2 medidas por da para los hombres. Est atento a la cantidad de alcohol que hay en las bebidas que toma. En los [place], una medida equivale a una botella de cerveza de 12 oz (355 ml), un vaso de vino de 5 oz (148 ml) o un vaso de una bebida alcohlica de alta graduacin de 1 oz (44 ml). Mantngase hidratado bebiendo agua, refrescos dietticos o t helado sin azcar. Tenga en cuenta que los refrescos comunes, los jugos y otras bebida para Optician, dispensing pueden contener mucha azcar y se deben contar como carbohidratos. Consejos para seguir Photographer las etiquetas de los alimentos Comience por leer el tamao de la porcin en la "Informacin nutricional" en las etiquetas de los alimentos envasados y las bebidas. La cantidad de caloras, carbohidratos, grasas y otros nutrientes mencionados en la etiqueta se basan en una porcin del alimento. Muchos alimentos contienen ms de una porcin por envase. Verifique la cantidad total de gramos (g) de carbohidratos totales en una porcin. Puede calcular la cantidad de porciones de carbohidratos al dividir el total de carbohidratos por 15. Por ejemplo, si un alimento tiene un  total de 30 g de carbohidratos totales por porcin, equivale a 2 porciones de carbohidratos. Verifique la cantidad de gramos (g) de grasas  saturadas y grasas trans de una porcin. Escoja alimentos que no contengan estas grasas o que su contenido de estas sea [place]. Verifique la cantidad de miligramos (mg) de sal (sodio) en una porcin. La [employer] de las personas deben limitar la ingesta de sodio total a menos de 2300 mg Honeywell. Siempre consulte la informacin nutricional de los alimentos etiquetados como "con bajo contenido de grasa" o "sin grasa". Estos alimentos pueden tener un mayor contenido de Location manager agregada o carbohidratos refinados, y deben evitarse. Hable con su nutricionista para identificar sus objetivos diarios en cuanto a los nutrientes mencionados en la etiqueta. Al ir de compras Evite comprar alimentos procesados, enlatados o precocidos. Estos alimentos tienden a Special educational needs teacher mayor cantidad de [place], sodio y azcar agregada. Compre en la zona exterior de la tienda de comestibles. Esta es la zona donde se encuentran con mayor frecuencia las frutas y las verduras frescas, los cereales a granel, las carnes frescas y los productos lcteos frescos. Al cocinar Utilice mtodos de coccin a baja temperatura, como hornear, en lugar de mtodos de coccin a alta temperatura, como frer en abundante aceite. Cocine con aceites saludables, como el aceite de [place], canola o [place]. Evite cocinar con manteca, crema o carnes con alto contenido de grasa. Planificacin de las comidas Coma las comidas y los refrigerios regularmente, preferentemente a la misma hora todos [place]. Evite pasar largos perodos de tiempo sin comer. Consuma alimentos ricos en fibra, como frutas frescas, verduras, frijoles y cereales integrales. Consulte a su nutricionista sobre cuntas porciones de carbohidratos puede consumir en cada comida. Consuma entre 4 y 6 onzas (entre 112 y 168 g) de protenas magras por da, como carnes [place], pollo, pescado, huevos o tofu. Una onza (oz) de protena magra equivale a: 1 onza (28 g) de carne, pollo o pescado. 1 huevo.  de  taza (62 g) de tofu. Coma algunos alimentos por da que contengan grasas saludables, como aguacates, frutos secos, semillas y pescado. Qu alimentos debo comer? Lambert Mody Bayas. Manzanas. Naranjas. Duraznos. Damascos. Ciruelas. Uvas. Mango. Papaya. Paguate. Kiwi. Cerezas. [place] Valeda Malm. Espinaca. Verduras de Boeing, que incluyen col rizada, [place], hojas de [place] y de [place]. Remolachas. Coliflor. Repollo. Brcoli. Zanahorias. Judas verdes. Tomates. Pimientos. Cebollas. Pepinos. Coles de Bruselas. Granos Granos integrales, como panes, galletas, tortillas, cereales y pastas de salvado o integrales. Avena sin azcar. Quinua. Arroz integral o salvaje. Carnes y Psychiatric nurse. Carne de ave sin piel. Cortes magros de ave y carne de res. Tofu. Frutos secos. Semillas. Lcteos Productos lcteos sin grasa o con bajo contenido de [place], [place], yogur y [place]. Es posible que los productos que se enumeran ms [place] no constituyan una lista completa de los alimentos y las bebidas que puede tomar. Consulte a un nutricionista para obtener ms informacin. Qu alimentos debo evitar? Lambert Mody Frutas enlatadas al almbar. Verduras Verduras enlatadas. Verduras congeladas con mantequilla o salsa de crema. Granos Productos elaborados con [place] y [place], como panes, pastas, bocadillos y cereales. Evite todos los alimentos procesados. Carnes y otras protenas Cortes de carne con alto contenido de Lobbyist. Carne de ave con piel. Carnes empanizadas o fritas. Carne procesada. Evite las grasas saturadas. Lcteos Yogur, [place] enteros. Bebidas Bebidas azucaradas, como gaseosas o t helado. Es posible que los productos que se enumeran ms [place] no constituyan una lista [place] de  los alimentos y las bebidas que debe evitar. Consulte a un nutricionista para obtener ms informacin. Preguntas para hacerle al mdico Es necesario que me rena con Radio broadcast assistant en el cuidado  de la diabetes? Es necesario que me rena con un nutricionista? A qu nmero puedo llamar si tengo preguntas? Cules son los mejores momentos para controlar la glucemia? Dnde encontrar ms informacin: Asociacin Estadounidense de la Diabetes (American Diabetes Association): diabetes.org Academy of Nutrition and Dietetics (Academia de Nutricin y Information systems manager): www.eatright.Unisys Corporation of Diabetes and Digestive and Kidney Diseases ([place] la Diabetes y [place] y [place]): DesMoinesFuneral.dk Association of Diabetes Care and Education Specialists (Asociacin de Especialistas en Atencin y Educacin sobre la Diabetes): www.diabeteseducator.org Resumen Es importante tener hbitos alimenticios saludables debido a que sus niveles de Designer, television/film set sangre (glucosa) se ven afectados en gran medida por lo que come y bebe. Un plan de alimentacin saludable lo ayudar a controlar la glucemia y Theatre manager un estilo de vida saludable. El mdico puede recomendarle que trabaje con un nutricionista para elaborar el mejor plan para usted. Tenga en cuenta que los carbohidratos (hidratos de carbono) y el alcohol tienen efectos inmediatos en sus niveles de glucemia. Es importante contar los carbohidratos que ingiere y consumir alcohol con prudencia. Esta informacin no tiene Marine scientist el consejo del mdico. Asegrese de hacerle al mdico cualquier pregunta que tenga. Document Revised: 07/25/2019 Document Reviewed: 07/25/2019 Elsevier Patient Education  2021 [employer].

## 2021-04-14 ENCOUNTER — Other Ambulatory Visit (INDEPENDENT_AMBULATORY_CARE_PROVIDER_SITE_OTHER): Payer: Self-pay

## 2021-04-19 ENCOUNTER — Other Ambulatory Visit (INDEPENDENT_AMBULATORY_CARE_PROVIDER_SITE_OTHER): Payer: Self-pay

## 2021-05-12 ENCOUNTER — Other Ambulatory Visit (INDEPENDENT_AMBULATORY_CARE_PROVIDER_SITE_OTHER): Payer: Self-pay | Admitting: Primary Care

## 2021-05-12 ENCOUNTER — Other Ambulatory Visit: Payer: Self-pay

## 2021-05-12 DIAGNOSIS — E782 Mixed hyperlipidemia: Secondary | ICD-10-CM

## 2021-05-12 MED ORDER — FENOFIBRATE 145 MG PO TABS
ORAL_TABLET | Freq: Every day | ORAL | 0 refills | Status: DC
  Filled 2021-05-12: qty 30, 30d supply, fill #0

## 2021-05-12 MED ORDER — OMEPRAZOLE 20 MG PO CPDR
20.0000 mg | DELAYED_RELEASE_CAPSULE | Freq: Every day | ORAL | 0 refills | Status: DC
  Filled 2021-05-12: qty 30, 30d supply, fill #0
  Filled 2021-06-10 (×2): qty 30, 30d supply, fill #1
  Filled 2021-07-13: qty 30, 30d supply, fill #0

## 2021-05-12 MED ORDER — ATORVASTATIN CALCIUM 40 MG PO TABS
ORAL_TABLET | Freq: Every day | ORAL | 0 refills | Status: DC
  Filled 2021-05-12: qty 30, 30d supply, fill #0

## 2021-05-12 NOTE — Telephone Encounter (Signed)
Sent to PCP to refill if appropriate.  

## 2021-05-13 ENCOUNTER — Other Ambulatory Visit: Payer: Self-pay

## 2021-06-10 ENCOUNTER — Other Ambulatory Visit (INDEPENDENT_AMBULATORY_CARE_PROVIDER_SITE_OTHER): Payer: Self-pay | Admitting: Primary Care

## 2021-06-10 ENCOUNTER — Other Ambulatory Visit: Payer: Self-pay

## 2021-06-10 DIAGNOSIS — E782 Mixed hyperlipidemia: Secondary | ICD-10-CM

## 2021-06-10 DIAGNOSIS — E1165 Type 2 diabetes mellitus with hyperglycemia: Secondary | ICD-10-CM

## 2021-06-10 DIAGNOSIS — Z794 Long term (current) use of insulin: Secondary | ICD-10-CM

## 2021-06-10 NOTE — Telephone Encounter (Signed)
Sent to PCP ?

## 2021-06-13 MED ORDER — ATORVASTATIN CALCIUM 40 MG PO TABS
ORAL_TABLET | Freq: Every day | ORAL | 0 refills | Status: DC
Start: 2021-06-13 — End: 2021-08-16
  Filled 2021-06-13 – 2021-07-13 (×2): qty 30, 30d supply, fill #0

## 2021-06-13 MED ORDER — GABAPENTIN 300 MG PO CAPS
300.0000 mg | ORAL_CAPSULE | Freq: Three times a day (TID) | ORAL | 1 refills | Status: DC
Start: 2021-06-13 — End: 2021-09-13
  Filled 2021-06-13 – 2021-07-13 (×2): qty 90, 30d supply, fill #0
  Filled 2021-08-16: qty 90, 30d supply, fill #1

## 2021-06-13 MED ORDER — FENOFIBRATE 145 MG PO TABS
ORAL_TABLET | Freq: Every day | ORAL | 0 refills | Status: DC
  Filled 2021-06-13 – 2021-07-13 (×2): qty 30, 30d supply, fill #0

## 2021-06-14 ENCOUNTER — Other Ambulatory Visit: Payer: Self-pay

## 2021-06-18 ENCOUNTER — Other Ambulatory Visit: Payer: Self-pay

## 2021-06-21 ENCOUNTER — Other Ambulatory Visit: Payer: Self-pay

## 2021-07-13 ENCOUNTER — Other Ambulatory Visit: Payer: Self-pay

## 2021-07-14 ENCOUNTER — Other Ambulatory Visit: Payer: Self-pay

## 2021-08-16 ENCOUNTER — Other Ambulatory Visit: Payer: Self-pay

## 2021-08-16 ENCOUNTER — Other Ambulatory Visit (INDEPENDENT_AMBULATORY_CARE_PROVIDER_SITE_OTHER): Payer: Self-pay | Admitting: Primary Care

## 2021-08-16 DIAGNOSIS — E782 Mixed hyperlipidemia: Secondary | ICD-10-CM

## 2021-08-17 ENCOUNTER — Other Ambulatory Visit: Payer: Self-pay

## 2021-08-17 ENCOUNTER — Other Ambulatory Visit (INDEPENDENT_AMBULATORY_CARE_PROVIDER_SITE_OTHER): Payer: Self-pay | Admitting: Primary Care

## 2021-08-17 MED ORDER — ATORVASTATIN CALCIUM 40 MG PO TABS
ORAL_TABLET | Freq: Every day | ORAL | 0 refills | Status: DC
  Filled 2021-08-17: qty 30, 30d supply, fill #0

## 2021-08-17 MED ORDER — OMEPRAZOLE 20 MG PO CPDR
20.0000 mg | DELAYED_RELEASE_CAPSULE | Freq: Every day | ORAL | 0 refills | Status: DC
  Filled 2021-08-17: qty 90, 90d supply, fill #0

## 2021-08-17 NOTE — Telephone Encounter (Signed)
Requested medications are due for refill today.  yes  Requested medications are on the active medications list.  yes  Last refill. 06/13/2021 #30 0 refills  Future visit scheduled.   yes  Notes to clinic.  Labs are expired.    Requested Prescriptions  Pending Prescriptions Disp Refills   fenofibrate (TRICOR) 145 MG tablet 30 tablet 0    Sig: TAKE 1 TABLET (145 MG TOTAL) BY MOUTH DAILY.     Cardiovascular:  Antilipid - Fibric Acid Derivatives Failed - 08/17/2021 12:00 PM      Failed - ALT in normal range and within 360 days    ALT  Date Value Ref Range Status  03/26/2019 43 (H) 0 - 32 IU/L Final          Failed - AST in normal range and within 360 days    AST  Date Value Ref Range Status  03/26/2019 31 0 - 40 IU/L Final          Failed - Cr in normal range and within 360 days    Creat  Date Value Ref Range Status  08/25/2016 0.69 0.50 - 1.10 mg/dL Final   Creatinine, Ser  Date Value Ref Range Status  05/10/2019 0.84 0.44 - 1.00 mg/dL Final   Creatinine, Urine  Date Value Ref Range Status  08/25/2016 208 20 - 320 mg/dL Final          Failed - HGB in normal range and within 360 days    Hemoglobin  Date Value Ref Range Status  05/10/2019 11.3 (L) 12.0 - 15.0 g/dL Final  03/26/2019 13.1 11.1 - 15.9 g/dL Final          Failed - HCT in normal range and within 360 days    HCT  Date Value Ref Range Status  05/10/2019 36.6 36.0 - 46.0 % Final   Hematocrit  Date Value Ref Range Status  03/26/2019 42.4 34.0 - 46.6 % Final          Failed - PLT in normal range and within 360 days    Platelets  Date Value Ref Range Status  05/10/2019 229 150 - 400 K/uL Final  03/26/2019 289 150 - 450 x10E3/uL Final          Failed - WBC in normal range and within 360 days    WBC  Date Value Ref Range Status  05/10/2019 12.6 (H) 4.0 - 10.5 K/uL Final          Failed - eGFR is 30 or above and within 360 days    GFR, Est African American  Date Value Ref Range Status   08/25/2016 >89 >=60 mL/min Final   GFR calc Af Amer  Date Value Ref Range Status  05/10/2019 >60 >60 mL/min Final   GFR, Est Non African American  Date Value Ref Range Status  08/25/2016 >89 >=60 mL/min Final   GFR calc non Af Amer  Date Value Ref Range Status  05/10/2019 >60 >60 mL/min Final          Failed - Lipid Panel in normal range within the last 12 months    Cholesterol, Total  Date Value Ref Range Status  09/10/2020 147 100 - 199 mg/dL Final   LDL Chol Calc (NIH)  Date Value Ref Range Status  09/10/2020 73 0 - 99 mg/dL Final   HDL  Date Value Ref Range Status  09/10/2020 29 (L) >39 mg/dL Final   Triglycerides  Date Value Ref Range Status  09/10/2020 277 (H) 0 - 149 mg/dL Final         Passed - Valid encounter within last 12 months    Recent Outpatient Visits           4 months ago Uncontrolled type 2 diabetes mellitus with hyperglycemia, with long-term current use of insulin ([place])   [place] [facility] Juluis Mire P, NP   11 months ago Cervical cancer screening   [place], Michelle P, NP   11 months ago Uncontrolled type 2 diabetes mellitus with hyperglycemia, with long-term current use of insulin ([place])   [place], [place]iehle, NP   1 year ago Dysuria   [place] Kerin Perna, NP   1 year ago Uncontrolled type 2 diabetes mellitus with hyperglycemia, with long-term current use of insulin ([place])   [place], [place], NP       Future Appointments             In 1 week Oletta Lamas Milford Cage, NP [place]

## 2021-08-17 NOTE — Telephone Encounter (Signed)
Requested medication (s) are due for refill today - yes  Requested medication (s) are on the active medication list -yes  Future visit scheduled -no  Last refill: 06/13/21 #30  Notes to clinic: Request RF: fails lab protocol  Requested Prescriptions  Pending Prescriptions Disp Refills   fenofibrate (TRICOR) 145 MG tablet 30 tablet 0    Sig: TAKE 1 TABLET (145 MG TOTAL) BY MOUTH DAILY.     Cardiovascular:  Antilipid - Fibric Acid Derivatives Failed - 08/16/2021 12:32 PM      Failed - ALT in normal range and within 360 days    ALT  Date Value Ref Range Status  03/26/2019 43 (H) 0 - 32 IU/L Final          Failed - AST in normal range and within 360 days    AST  Date Value Ref Range Status  03/26/2019 31 0 - 40 IU/L Final          Failed - Cr in normal range and within 360 days    Creat  Date Value Ref Range Status  08/25/2016 0.69 0.50 - 1.10 mg/dL Final   Creatinine, Ser  Date Value Ref Range Status  05/10/2019 0.84 0.44 - 1.00 mg/dL Final   Creatinine, Urine  Date Value Ref Range Status  08/25/2016 208 20 - 320 mg/dL Final          Failed - HGB in normal range and within 360 days    Hemoglobin  Date Value Ref Range Status  05/10/2019 11.3 (L) 12.0 - 15.0 g/dL Final  03/26/2019 13.1 11.1 - 15.9 g/dL Final          Failed - HCT in normal range and within 360 days    HCT  Date Value Ref Range Status  05/10/2019 36.6 36.0 - 46.0 % Final   Hematocrit  Date Value Ref Range Status  03/26/2019 42.4 34.0 - 46.6 % Final          Failed - PLT in normal range and within 360 days    Platelets  Date Value Ref Range Status  05/10/2019 229 150 - 400 K/uL Final  03/26/2019 289 150 - 450 x10E3/uL Final          Failed - WBC in normal range and within 360 days    WBC  Date Value Ref Range Status  05/10/2019 12.6 (H) 4.0 - 10.5 K/uL Final          Failed - eGFR is 30 or above and within 360 days    GFR, Est African American  Date Value Ref Range Status   08/25/2016 >89 >=60 mL/min Final   GFR calc Af Amer  Date Value Ref Range Status  05/10/2019 >60 >60 mL/min Final   GFR, Est Non African American  Date Value Ref Range Status  08/25/2016 >89 >=60 mL/min Final   GFR calc non Af Amer  Date Value Ref Range Status  05/10/2019 >60 >60 mL/min Final          Failed - Lipid Panel in normal range within the last 12 months    Cholesterol, Total  Date Value Ref Range Status  09/10/2020 147 100 - 199 mg/dL Final   LDL Chol Calc (NIH)  Date Value Ref Range Status  09/10/2020 73 0 - 99 mg/dL Final   HDL  Date Value Ref Range Status  09/10/2020 29 (L) >39 mg/dL Final   Triglycerides  Date Value Ref Range Status  09/10/2020 277 (H) 0 -  149 mg/dL Final         Passed - Valid encounter within last 12 months    Recent Outpatient Visits           4 months ago Uncontrolled type 2 diabetes mellitus with hyperglycemia, with long-term current use of insulin ([place])   [place] [facility] Kerin Perna, NP   11 months ago Cervical cancer screening   [place], Michelle P, NP   11 months ago Uncontrolled type 2 diabetes mellitus with hyperglycemia, with long-term current use of insulin ([place])   [place], [place], NP   1 year ago Dysuria   [place] Kerin Perna, NP   1 year ago Uncontrolled type 2 diabetes mellitus with hyperglycemia, with long-term current use of insulin ([place])   [place] [facility] Kerin Perna, NP              Signed Prescriptions Disp Refills   omeprazole (PRILOSEC) 20 MG capsule 90 capsule 0    Sig: TAKE 1 CAPSULE (20 MG TOTAL) BY MOUTH DAILY.     Gastroenterology: Proton Pump Inhibitors Passed - 08/16/2021 12:32 PM      Passed - Valid encounter within last 12 months    Recent Outpatient Visits           4 months ago Uncontrolled type 2 diabetes mellitus with  hyperglycemia, with long-term current use of insulin ([place])   [place] Juluis Mire P, NP   11 months ago Cervical cancer screening   [place], Michelle P, NP   11 months ago Uncontrolled type 2 diabetes mellitus with hyperglycemia, with long-term current use of insulin ([place])   [place] Kerin Perna, NP   1 year ago Dysuria   [place] Kerin Perna, NP   1 year ago Uncontrolled type 2 diabetes mellitus with hyperglycemia, with long-term current use of insulin ([place])   [place] [facility] Juluis Mire P, NP               atorvastatin (LIPITOR) 40 MG tablet 30 tablet 0    Sig: TAKE 1 TABLET (40 MG TOTAL) BY MOUTH AT BEDTIME.     Cardiovascular:  Antilipid - Statins Failed - 08/16/2021 12:32 PM      Failed - Lipid Panel in normal range within the last 12 months    Cholesterol, Total  Date Value Ref Range Status  09/10/2020 147 100 - 199 mg/dL Final   LDL Chol Calc (NIH)  Date Value Ref Range Status  09/10/2020 73 0 - 99 mg/dL Final   HDL  Date Value Ref Range Status  09/10/2020 29 (L) >39 mg/dL Final   Triglycerides  Date Value Ref Range Status  09/10/2020 277 (H) 0 - 149 mg/dL Final         Passed - Patient is not pregnant      Passed - Valid encounter within last 12 months    Recent Outpatient Visits           4 months ago Uncontrolled type 2 diabetes mellitus with hyperglycemia, with long-term current use of insulin ([place])   [place] [facility] Kerin Perna, NP   11 months ago Cervical cancer screening   [place], Michelle P, NP   11 months  ago Uncontrolled type 2 diabetes mellitus with hyperglycemia, with long-term current use of insulin ([place])   [place] [facility] Kerin Perna, NP   1 year ago Dysuria   [place]  Kerin Perna, NP   1 year ago Uncontrolled type 2 diabetes mellitus with hyperglycemia, with long-term current use of insulin ([place])   [place] [facility] Kerin Perna, NP                 Requested Prescriptions  Pending Prescriptions Disp Refills   fenofibrate (TRICOR) 145 MG tablet 30 tablet 0    Sig: TAKE 1 TABLET (145 MG TOTAL) BY MOUTH DAILY.     Cardiovascular:  Antilipid - Fibric Acid Derivatives Failed - 08/16/2021 12:32 PM      Failed - ALT in normal range and within 360 days    ALT  Date Value Ref Range Status  03/26/2019 43 (H) 0 - 32 IU/L Final          Failed - AST in normal range and within 360 days    AST  Date Value Ref Range Status  03/26/2019 31 0 - 40 IU/L Final          Failed - Cr in normal range and within 360 days    Creat  Date Value Ref Range Status  08/25/2016 0.69 0.50 - 1.10 mg/dL Final   Creatinine, Ser  Date Value Ref Range Status  05/10/2019 0.84 0.44 - 1.00 mg/dL Final   Creatinine, Urine  Date Value Ref Range Status  08/25/2016 208 20 - 320 mg/dL Final          Failed - HGB in normal range and within 360 days    Hemoglobin  Date Value Ref Range Status  05/10/2019 11.3 (L) 12.0 - 15.0 g/dL Final  03/26/2019 13.1 11.1 - 15.9 g/dL Final          Failed - HCT in normal range and within 360 days    HCT  Date Value Ref Range Status  05/10/2019 36.6 36.0 - 46.0 % Final   Hematocrit  Date Value Ref Range Status  03/26/2019 42.4 34.0 - 46.6 % Final          Failed - PLT in normal range and within 360 days    Platelets  Date Value Ref Range Status  05/10/2019 229 150 - 400 K/uL Final  03/26/2019 289 150 - 450 x10E3/uL Final          Failed - WBC in normal range and within 360 days    WBC  Date Value Ref Range Status  05/10/2019 12.6 (H) 4.0 - 10.5 K/uL Final          Failed - eGFR is 30 or above and within 360 days    GFR, Est African American  Date Value Ref Range Status   08/25/2016 >89 >=60 mL/min Final   GFR calc Af Amer  Date Value Ref Range Status  05/10/2019 >60 >60 mL/min Final   GFR, Est Non African American  Date Value Ref Range Status  08/25/2016 >89 >=60 mL/min Final   GFR calc non Af Amer  Date Value Ref Range Status  05/10/2019 >60 >60 mL/min Final          Failed - Lipid Panel in normal range within the last 12 months    Cholesterol, Total  Date Value Ref Range Status  09/10/2020 147 100 - 199 mg/dL Final  LDL Chol Calc (NIH)  Date Value Ref Range Status  09/10/2020 73 0 - 99 mg/dL Final   HDL  Date Value Ref Range Status  09/10/2020 29 (L) >39 mg/dL Final   Triglycerides  Date Value Ref Range Status  09/10/2020 277 (H) 0 - 149 mg/dL Final         Passed - Valid encounter within last 12 months    Recent Outpatient Visits           4 months ago Uncontrolled type 2 diabetes mellitus with hyperglycemia, with long-term current use of insulin ([place])   [place] [facility] Juluis Mire P, NP   11 months ago Cervical cancer screening   [place], Michelle P, NP   11 months ago Uncontrolled type 2 diabetes mellitus with hyperglycemia, with long-term current use of insulin ([place])   [place], [place], NP   1 year ago Dysuria   [place] Kerin Perna, NP   1 year ago Uncontrolled type 2 diabetes mellitus with hyperglycemia, with long-term current use of insulin ([place])   [place] [facility] Kerin Perna, NP              Signed Prescriptions Disp Refills   omeprazole (PRILOSEC) 20 MG capsule 90 capsule 0    Sig: TAKE 1 CAPSULE (20 MG TOTAL) BY MOUTH DAILY.     Gastroenterology: Proton Pump Inhibitors Passed - 08/16/2021 12:32 PM      Passed - Valid encounter within last 12 months    Recent Outpatient Visits           4 months ago Uncontrolled type 2 diabetes mellitus with  hyperglycemia, with long-term current use of insulin ([place])   [place] Juluis Mire P, NP   11 months ago Cervical cancer screening   [place], Michelle P, NP   11 months ago Uncontrolled type 2 diabetes mellitus with hyperglycemia, with long-term current use of insulin ([place])   [place] Kerin Perna, NP   1 year ago Dysuria   [place] Kerin Perna, NP   1 year ago Uncontrolled type 2 diabetes mellitus with hyperglycemia, with long-term current use of insulin ([place])   [place] [facility] Juluis Mire P, NP               atorvastatin (LIPITOR) 40 MG tablet 30 tablet 0    Sig: TAKE 1 TABLET (40 MG TOTAL) BY MOUTH AT BEDTIME.     Cardiovascular:  Antilipid - Statins Failed - 08/16/2021 12:32 PM      Failed - Lipid Panel in normal range within the last 12 months    Cholesterol, Total  Date Value Ref Range Status  09/10/2020 147 100 - 199 mg/dL Final   LDL Chol Calc (NIH)  Date Value Ref Range Status  09/10/2020 73 0 - 99 mg/dL Final   HDL  Date Value Ref Range Status  09/10/2020 29 (L) >39 mg/dL Final   Triglycerides  Date Value Ref Range Status  09/10/2020 277 (H) 0 - 149 mg/dL Final         Passed - Patient is not pregnant      Passed - Valid encounter within last 12 months    Recent Outpatient Visits           4 months  ago Uncontrolled type 2 diabetes mellitus with hyperglycemia, with long-term current use of insulin ([place])   [place] [facility] Kerin Perna, NP   11 months ago Cervical cancer screening   [place], Michelle P, NP   11 months ago Uncontrolled type 2 diabetes mellitus with hyperglycemia, with long-term current use of insulin ([place])   [place], [place], NP   1 year ago Dysuria   [place]  Kerin Perna, NP   1 year ago Uncontrolled type 2 diabetes mellitus with hyperglycemia, with long-term current use of insulin ([place])   [place] Kerin Perna, NP

## 2021-08-17 NOTE — Telephone Encounter (Signed)
Requested Prescriptions  Pending Prescriptions Disp Refills   omeprazole (PRILOSEC) 20 MG capsule 90 capsule 0    Sig: TAKE 1 CAPSULE (20 MG TOTAL) BY MOUTH DAILY.     Gastroenterology: Proton Pump Inhibitors Passed - 08/16/2021 12:32 PM      Passed - Valid encounter within last 12 months    Recent Outpatient Visits          4 months ago Uncontrolled type 2 diabetes mellitus with hyperglycemia, with long-term current use of insulin ([place])   [place] Juluis Mire P, NP   11 months ago Cervical cancer screening   [place], Michelle P, NP   11 months ago Uncontrolled type 2 diabetes mellitus with hyperglycemia, with long-term current use of insulin ([place])   [place] Kerin Perna, NP   1 year ago Dysuria   [place] Kerin Perna, NP   1 year ago Uncontrolled type 2 diabetes mellitus with hyperglycemia, with long-term current use of insulin ([place])   [place] [facility] Juluis Mire P, NP              atorvastatin (LIPITOR) 40 MG tablet 30 tablet 0    Sig: TAKE 1 TABLET (40 MG TOTAL) BY MOUTH AT BEDTIME.     Cardiovascular:  Antilipid - Statins Failed - 08/16/2021 12:32 PM      Failed - Lipid Panel in normal range within the last 12 months    Cholesterol, Total  Date Value Ref Range Status  09/10/2020 147 100 - 199 mg/dL Final   LDL Chol Calc (NIH)  Date Value Ref Range Status  09/10/2020 73 0 - 99 mg/dL Final   HDL  Date Value Ref Range Status  09/10/2020 29 (L) >39 mg/dL Final   Triglycerides  Date Value Ref Range Status  09/10/2020 277 (H) 0 - 149 mg/dL Final         Passed - Patient is not pregnant      Passed - Valid encounter within last 12 months    Recent Outpatient Visits          4 months ago Uncontrolled type 2 diabetes mellitus with hyperglycemia, with long-term current use of insulin ([place])   [place] Juluis Mire P, NP   11 months ago Cervical cancer screening   [place], Michelle P, NP   11 months ago Uncontrolled type 2 diabetes mellitus with hyperglycemia, with long-term current use of insulin ([place])   [place], [place], NP   1 year ago Dysuria   [place] Kerin Perna, NP   1 year ago Uncontrolled type 2 diabetes mellitus with hyperglycemia, with long-term current use of insulin ([place])   [place] [facility] [place], Michelle P, NP              fenofibrate (TRICOR) 145 MG tablet 30 tablet 0    Sig: TAKE 1 TABLET (145 MG TOTAL) BY MOUTH DAILY.     Cardiovascular:  Antilipid - Fibric Acid Derivatives Failed - 08/16/2021 12:32 PM      Failed - ALT in normal range and within 360 days    ALT  Date Value Ref Range Status  03/26/2019 43 (H) 0 - 32 IU/L Final         Failed - AST in normal range and  within 360 days    AST  Date Value Ref Range Status  03/26/2019 31 0 - 40 IU/L Final         Failed - Cr in normal range and within 360 days    Creat  Date Value Ref Range Status  08/25/2016 0.69 0.50 - 1.10 mg/dL Final   Creatinine, Ser  Date Value Ref Range Status  05/10/2019 0.84 0.44 - 1.00 mg/dL Final   Creatinine, Urine  Date Value Ref Range Status  08/25/2016 208 20 - 320 mg/dL Final         Failed - HGB in normal range and within 360 days    Hemoglobin  Date Value Ref Range Status  05/10/2019 11.3 (L) 12.0 - 15.0 g/dL Final  03/26/2019 13.1 11.1 - 15.9 g/dL Final         Failed - HCT in normal range and within 360 days    HCT  Date Value Ref Range Status  05/10/2019 36.6 36.0 - 46.0 % Final   Hematocrit  Date Value Ref Range Status  03/26/2019 42.4 34.0 - 46.6 % Final         Failed - PLT in normal range and within 360 days    Platelets  Date Value Ref Range Status  05/10/2019 229 150 - 400 K/uL Final   03/26/2019 289 150 - 450 x10E3/uL Final         Failed - WBC in normal range and within 360 days    WBC  Date Value Ref Range Status  05/10/2019 12.6 (H) 4.0 - 10.5 K/uL Final         Failed - eGFR is 30 or above and within 360 days    GFR, Est African American  Date Value Ref Range Status  08/25/2016 >89 >=60 mL/min Final   GFR calc Af Amer  Date Value Ref Range Status  05/10/2019 >60 >60 mL/min Final   GFR, Est Non African American  Date Value Ref Range Status  08/25/2016 >89 >=60 mL/min Final   GFR calc non Af Amer  Date Value Ref Range Status  05/10/2019 >60 >60 mL/min Final         Failed - Lipid Panel in normal range within the last 12 months    Cholesterol, Total  Date Value Ref Range Status  09/10/2020 147 100 - 199 mg/dL Final   LDL Chol Calc (NIH)  Date Value Ref Range Status  09/10/2020 73 0 - 99 mg/dL Final   HDL  Date Value Ref Range Status  09/10/2020 29 (L) >39 mg/dL Final   Triglycerides  Date Value Ref Range Status  09/10/2020 277 (H) 0 - 149 mg/dL Final         Passed - Valid encounter within last 12 months    Recent Outpatient Visits          4 months ago Uncontrolled type 2 diabetes mellitus with hyperglycemia, with long-term current use of insulin ([place])   [place] [facility] Juluis Mire P, NP   11 months ago Cervical cancer screening   [place], Michelle P, NP   11 months ago Uncontrolled type 2 diabetes mellitus with hyperglycemia, with long-term current use of insulin ([place])   [place] [facility] Kerin Perna, NP   1 year ago Dysuria   [place] Kerin Perna, NP   1 year ago Uncontrolled type 2 diabetes mellitus with hyperglycemia, with long-term current  use of insulin ([place])   [place] Kerin Perna, NP

## 2021-08-19 ENCOUNTER — Other Ambulatory Visit: Payer: Self-pay

## 2021-08-30 ENCOUNTER — Ambulatory Visit (INDEPENDENT_AMBULATORY_CARE_PROVIDER_SITE_OTHER): Payer: Self-pay | Admitting: Primary Care

## 2021-08-30 ENCOUNTER — Other Ambulatory Visit: Payer: Self-pay

## 2021-08-30 ENCOUNTER — Encounter (INDEPENDENT_AMBULATORY_CARE_PROVIDER_SITE_OTHER): Payer: Self-pay | Admitting: Primary Care

## 2021-08-30 VITALS — BP 145/84 | HR 86 | Temp 97.8°F | Ht 65.0 in | Wt 260.6 lb

## 2021-08-30 DIAGNOSIS — Z794 Long term (current) use of insulin: Secondary | ICD-10-CM

## 2021-08-30 DIAGNOSIS — E782 Mixed hyperlipidemia: Secondary | ICD-10-CM

## 2021-08-30 DIAGNOSIS — I10 Essential (primary) hypertension: Secondary | ICD-10-CM

## 2021-08-30 DIAGNOSIS — E1165 Type 2 diabetes mellitus with hyperglycemia: Secondary | ICD-10-CM

## 2021-08-30 DIAGNOSIS — E119 Type 2 diabetes mellitus without complications: Secondary | ICD-10-CM

## 2021-08-30 DIAGNOSIS — K5900 Constipation, unspecified: Secondary | ICD-10-CM

## 2021-08-30 LAB — POCT GLYCOSYLATED HEMOGLOBIN (HGB A1C): Hemoglobin A1C: 11.6 % — AB (ref 4.0–5.6)

## 2021-08-30 MED ORDER — INSULIN LISPRO (1 UNIT DIAL) 100 UNIT/ML (KWIKPEN)
PEN_INJECTOR | SUBCUTANEOUS | 6 refills | Status: DC
  Filled 2021-08-30: qty 12, 33d supply, fill #0

## 2021-08-30 MED ORDER — LINACLOTIDE 145 MCG PO CAPS
145.0000 ug | ORAL_CAPSULE | Freq: Every day | ORAL | 1 refills | Status: DC
Start: 2021-08-30 — End: 2022-04-12
  Filled 2021-08-30: qty 30, 30d supply, fill #0
  Filled 2021-11-03: qty 30, 30d supply, fill #1

## 2021-08-30 MED ORDER — FENOFIBRATE 145 MG PO TABS
ORAL_TABLET | Freq: Every day | ORAL | 0 refills | Status: DC
  Filled 2021-08-30: qty 30, 30d supply, fill #0

## 2021-08-30 MED ORDER — GLIPIZIDE 10 MG PO TABS
ORAL_TABLET | Freq: Two times a day (BID) | ORAL | 1 refills | Status: DC
  Filled 2021-08-30: qty 180, fill #0
  Filled 2021-09-13: qty 60, 30d supply, fill #0
  Filled 2021-10-06: qty 60, 30d supply, fill #1
  Filled 2021-11-03: qty 60, 30d supply, fill #2
  Filled 2021-12-06: qty 60, 30d supply, fill #3

## 2021-08-30 MED ORDER — METFORMIN HCL 1000 MG PO TABS
ORAL_TABLET | Freq: Two times a day (BID) | ORAL | 3 refills | Status: DC
  Filled 2021-08-30: qty 180, fill #0
  Filled 2021-09-13: qty 180, 90d supply, fill #0
  Filled 2021-12-06: qty 60, 30d supply, fill #1

## 2021-08-30 MED ORDER — ATORVASTATIN CALCIUM 40 MG PO TABS
ORAL_TABLET | Freq: Every day | ORAL | 0 refills | Status: DC
  Filled 2021-08-30: qty 30, fill #0

## 2021-08-30 MED ORDER — LOSARTAN POTASSIUM 25 MG PO TABS
ORAL_TABLET | Freq: Every day | ORAL | 3 refills | Status: DC
  Filled 2021-08-30: qty 90, fill #0
  Filled 2021-09-13: qty 30, 30d supply, fill #0
  Filled 2021-10-06: qty 30, 30d supply, fill #1
  Filled 2021-11-03: qty 30, 30d supply, fill #2
  Filled 2021-12-06: qty 30, 30d supply, fill #3
  Filled 2022-01-03: qty 30, 30d supply, fill #4
  Filled 2022-01-31: qty 30, 30d supply, fill #5
  Filled 2022-03-04: qty 30, 30d supply, fill #6
  Filled 2022-04-05: qty 30, 30d supply, fill #7

## 2021-08-30 MED ORDER — BASAGLAR KWIKPEN 100 UNIT/ML ~~LOC~~ SOPN
PEN_INJECTOR | SUBCUTANEOUS | 6 refills | Status: DC
  Filled 2021-08-30: qty 15, fill #0
  Filled 2021-09-13: qty 15, 25d supply, fill #0
  Filled 2021-10-06: qty 18, 30d supply, fill #1
  Filled 2021-11-03: qty 18, 30d supply, fill #2
  Filled 2021-12-06: qty 18, 30d supply, fill #3

## 2021-08-30 NOTE — Progress Notes (Signed)
Subjective:  Patient ID: Julie Robertson, female    DOB: Nov 24, 1975  Age: 46 y.o. MRN: [id number]  CC: Diabetes   HPI Julie Robertson Hispanic female presents for follow-up of diabetes. Patient does sometimes check blood sugar at home  Compliant with meds - Yes Checking CBGs? Yes  Fasting avg - 120-300  Postprandial average -  Exercising regularly? - Yes Watching carbohydrate intake? - Yes Neuropathy ? - Yes Hypoglycemic events - No  - Recovers with :   Pertinent ROS:  Polyuria - No Polydipsia - No Vision problems - No Also, the management of HTN. Denies shortness of breath, headaches, chest pain or lower extremity edema . She is concern of being out of cholesterol medication- explained that's why labs were needed and done today.  Medications as noted below. Taking them regularly without complication/adverse reaction being reported today.   History Julie Robertson has a past medical history of Anxiety, Asthma, Diabetes mellitus without complication ([place]), Diabetic neuropathy ([place]) (12/2013), Dyslipidemia (08/2009), Dyspnea, Gall stones (08/2009), GERD (gastroesophageal reflux disease), and Obesity.   She has a past surgical history that includes Tubal ligation; Cholecystectomy (N/A, 03/10/2015); ERCP (N/A, 03/11/2015); Esophagogastroduodenoscopy (egd) with propofol (N/A, 07/25/2018); Transduodenal Ampullectomy (09/2015); Ventral hernia repair (N/A, 05/09/2019); Insertion of mesh (N/A, 27/0/3500); and Application if wound vac (N/A, 05/09/2019).   Her family history includes Bone cancer in her maternal grandfather; Diabetes in her father and mother; Heart disease in her mother; Hypertension in her mother; Kidney disease in her maternal uncle; Other in her son.She reports that she has been smoking cigarettes. She has a 6.00 pack-year smoking history. She has never used smokeless tobacco. She reports current alcohol use. She reports that she does not use drugs.  Current Outpatient  Medications on File Prior to Visit  Medication Sig Dispense Refill   albuterol (VENTOLIN HFA) 108 (90 Base) MCG/ACT inhaler Inhale 2 puffs into the lungs every 6 (six) hours as needed for wheezing or shortness of breath. 1 Inhaler 1   Blood Glucose Monitoring Suppl (TRUE METRIX METER) w/Device KIT Use as directed 1 kit 0   Blood Glucose Monitoring Suppl (TRUE METRIX METER) w/Device KIT use up to 4 times daily as directed 1 kit 0   gabapentin (NEURONTIN) 300 MG capsule Take 1 capsule (300 mg total) by mouth 3 (three) times daily. 90 capsule 1   glipiZIDE (GLUCOTROL) 10 MG tablet TAKE 1 TABLET (10 MG TOTAL) BY MOUTH 2 (TWO) TIMES DAILY BEFORE A MEAL. 180 tablet 1   glucose blood (TRUE METRIX BLOOD GLUCOSE TEST) test strip Use as instructed 100 each 12   glucose blood test strip use up to 4 times daily as directed 100 each 0   hydrocortisone (ANUSOL-HC) 25 MG suppository PLACE 1 SUPPOSITORY (25 MG TOTAL) RECTALLY 2 (TWO) TIMES DAILY. 12 suppository 0   Insulin Glargine (BASAGLAR KWIKPEN) 100 UNIT/ML INJECT 60 UNITS INTO THE SKIN AT BEDTIME. 15 mL 6   insulin lispro (HUMALOG) 100 UNIT/ML KwikPen INJECT 12 UNITS INTO THE SKIN 3 TIMES DAILY PER SLIDING SCALE 15 mL 11   Insulin Pen Needle 31G X 5 MM MISC Inject 10 units subcutaneous at bedtime 100 each 3   Insulin Pen Needle 32G X 4 MM MISC USE AS DIRECTED 100 each 3   omeprazole (PRILOSEC) 20 MG capsule TAKE 1 CAPSULE (20 MG TOTAL) BY MOUTH DAILY. 90 capsule 0   sorbitol 70 % SOLN Take 30 mLs by mouth daily as needed for moderate constipation. 473 mL 0  TRUEplus Lancets 28G MISC use up to 4 times daily as directed 100 each 0   No current facility-administered medications on file prior to visit.    ROS Comprehensive ROS Pertinent positive and negative noted in HPI  Objective:  BP (!) 145/84 (BP Location: Right Arm, Patient Position: Sitting, Cuff Size: Normal)    Pulse 86    Temp 97.8 F (36.6 C) (Oral)    Ht 5' 5"  (1.651 m)    Wt 260 lb 9.6 oz  (118.2 kg)    SpO2 97%    BMI 43.37 kg/m   BP Readings from Last 3 Encounters:  08/30/21 (!) 145/84  04/12/21 133/79  09/10/20 127/83    Wt Readings from Last 3 Encounters:  08/30/21 260 lb 9.6 oz (118.2 kg)  04/12/21 260 lb 12.8 oz (118.3 kg)  09/10/20 269 lb 6.4 oz (122.2 kg)    Physical Exam Vitals reviewed.  Constitutional:      Appearance: She is obese.  HENT:     Head: Normocephalic.     Right Ear: Tympanic membrane and external ear normal.     Left Ear: Tympanic membrane and external ear normal.     Nose: Nose normal.  Eyes:     Extraocular Movements: Extraocular movements intact.     Pupils: Pupils are equal, round, and reactive to light.  Cardiovascular:     Rate and Rhythm: Normal rate and regular rhythm.  Pulmonary:     Effort: Pulmonary effort is normal.     Breath sounds: Normal breath sounds.  Abdominal:     General: Bowel sounds are normal. There is distension.     Palpations: Abdomen is soft.  Musculoskeletal:        General: Normal range of motion.  Skin:    General: Skin is warm and dry.  Neurological:     Mental Status: She is alert and oriented to person, place, and time.  Psychiatric:        Mood and Affect: Mood normal.        Behavior: Behavior normal.        Thought Content: Thought content normal.        Judgment: Judgment normal.   Lab Results  Component Value Date   HGBA1C 11.6 (A) 08/30/2021   HGBA1C 12.6 (A) 04/12/2021   HGBA1C 12.6 (A) 09/01/2020    Lab Results  Component Value Date   WBC 12.6 (H) 05/10/2019   HGB 11.3 (L) 05/10/2019   HCT 36.6 05/10/2019   PLT 229 05/10/2019   GLUCOSE 218 (H) 05/10/2019   CHOL 147 09/10/2020   TRIG 277 (H) 09/10/2020   HDL 29 (L) 09/10/2020   LDLCALC 73 09/10/2020   ALT 43 (H) 03/26/2019   AST 31 03/26/2019   NA 140 05/10/2019   K 4.7 05/10/2019   CL 111 05/10/2019   CREATININE 0.84 05/10/2019   BUN 12 05/10/2019   CO2 22 05/10/2019   TSH 3.090 03/22/2018   HGBA1C 11.6 (A)  08/30/2021   MICROALBUR 2.9 08/25/2016     Assessment & Plan:  Julie Robertson was seen today for diabetes.  Diagnoses and all orders for this visit:  Uncontrolled type 2 diabetes mellitus with hyperglycemia, with long-term current use of insulin (HCC) -     HgB A1c 11.2 -     metFORMIN (GLUCOPHAGE) 1000 MG tablet; TAKE 1 TABLET (1,000 MG TOTAL) BY MOUTH 2 (TWO) TIMES DAILY WITH A MEAL. Discussed  co- morbidities with uncontrol diabetes  Complications -diabetic retinopathy, (  close your eyes ? What do you see nothing) nephropathy decrease in kidney function- can lead to dialysis-on a machine 3 days a week to filter your kidney, neuropathy- numbness and tinging in your hands and feet,  increase risk of heart attack and stroke, and amputation due to decrease wound healing and circulation. Decrease your risk by taking medication daily as prescribed, monitor carbohydrates- foods that are high in carbohydrates are the following rice, potatoes, breads, sugars, and pastas.  Reduction in the intake (eating) will assist in lowering your blood sugars. Exercise daily at least 30 minutes daily.   Essential hypertension We have discussed target BP range and blood pressure goal. I have advised patient to check BP regularly and to call us back or report to clinic if the numbers are consistently higher than 140/90. We discussed the importance of compliance with medical therapy and DASH diet recommended, consequences of uncontrolled hypertension discussed.  - continue current BP medications  -     CBC with Differential/Platelet -     CMP14+EGFR -     losartan (COZAAR) 25 MG tablet; TAKE 1 TABLET (25 MG TOTAL) BY MOUTH DAILY.  Mixed hyperlipidemia  Healthy lifestyle diet of fruits vegetables fish nuts whole grains and low saturated fat . Foods high in cholesterol or liver, fatty meats,cheese, butter avocados, nuts and seeds, chocolate and fried foods. -     Lipid panel -     atorvastatin (LIPITOR) 40 MG tablet; TAKE 1  TABLET (40 MG TOTAL) BY MOUTH AT BEDTIME.  Comprehensive diabetic foot examination, type 2 DM, encounter for ([place])  Constipation, unspecified constipation type MANAGEMENT OF CHRONIC CONSTIPATION  Drink fluids in the recommended amount everyday. Recommend amount is 8 cups of water daily. Do not replace water with Gatorade or Powerade as these should only be used when you are dehydrated.  Eat lots of high fiber foods-fruits, veggies, bran and whole grain instead of white bread Be active everyday. Inactivity makes constipation worse. Add psyllium daily (Metamucil) which comes in capsules now. Start very low dose and work up to recommended dose on bottle daily. Stay away from [place] or any magnesium containing laxative, unless you need it to clear things out rarely. It is an addictive laxative and your gut will become dependent on it. If that is not working, I would start Miralax, which you can buy in generic 17 gms daily. It's a powder and not an "addictive laxative". Take it every day and titrate the dose up or down to get the daily Bm. We will consider the use of other pharmacological treatments should the above recommendations prove to be unsuccessful.    linaclotide (LINZESS) 145 MCG CAPS capsule; Take 1 capsule (145 mcg total) by mouth daily before breakfast.  Other orders  -     fenofibrate (TRICOR) 145 MG tablet; TAKE 1 TABLET (145 MG TOTAL) BY MOUTH DAILY. -       I have discontinued Julie Robertson's polyethylene glycol, ibuprofen, and sodium polystyrene. I am also having her start on linaclotide. Additionally, I am having her maintain her True Metrix Meter, glucose blood, albuterol, Insulin Pen Needle, sorbitol, hydrocortisone, Insulin Pen Needle, glipiZIDE, Basaglar KwikPen, insulin lispro, True Metrix Meter, glucose blood, TRUEplus Lancets 28G, gabapentin, omeprazole, fenofibrate, atorvastatin, losartan, and metFORMIN.  Meds ordered this encounter  Medications    fenofibrate (TRICOR) 145 MG tablet    Sig: TAKE 1 TABLET (145 MG TOTAL) BY MOUTH DAILY.    Dispense:  30 tablet  Refill:  0   atorvastatin (LIPITOR) 40 MG tablet    Sig: TAKE 1 TABLET (40 MG TOTAL) BY MOUTH AT BEDTIME.    Dispense:  30 tablet    Refill:  0   losartan (COZAAR) 25 MG tablet    Sig: TAKE 1 TABLET (25 MG TOTAL) BY MOUTH DAILY.    Dispense:  90 tablet    Refill:  3   metFORMIN (GLUCOPHAGE) 1000 MG tablet    Sig: TAKE 1 TABLET (1,000 MG TOTAL) BY MOUTH 2 (TWO) TIMES DAILY WITH A MEAL.    Dispense:  180 tablet    Refill:  3   linaclotide (LINZESS) 145 MCG CAPS capsule    Sig: Take 1 capsule (145 mcg total) by mouth daily before breakfast.    Dispense:  90 capsule    Refill:  1     Follow-up:   Return in about 3 months (around 11/27/2021) for DM/HTN.  The above assessment and management plan was discussed with the patient. The patient verbalized understanding of and has agreed to the management plan. Patient is aware to call the clinic if symptoms fail to improve or worsen. Patient is aware when to return to the clinic for a follow-up visit. Patient educated on when it is appropriate to go to the emergency department.   Juluis Mire, NP-C

## 2021-08-30 NOTE — Patient Instructions (Signed)
Diabetes mellitus y [place] fsica Diabetes Mellitus and Exercise Hacer actividad fsica habitualmente es importante para el estado de salud general, en especial para las personas que tienen diabetes mellitus. La actividad fsica no solo se reduce a Pharmacist, hospital. Aporta muchos beneficios para la salud, como aumento de la fuerza muscular y la densidad sea, y reduccin de las grasas corporales y Dealer. Esto mejora el estado fsico, la flexibilidad y la resistencia, y todo ello redunda en un mejor estado de salud general. Cules son los beneficios de la actividad fsica si tengo diabetes? La actividad fsica tiene muchos beneficios para las personas con diabetes. Incluyen los siguientes: Ayuda a Sports coach y Engineer, agricultural en la sangre (glucosa) bajo control. Mejora la respuesta del cuerpo a la hormona insulina porque optimiza la sensibilidad a la insulina. Reduce la cantidad de insulina que el cuerpo necesita. Reduce el riesgo de tener una enfermedad cardaca porque: [place] los niveles de colesterol malo y triglicridos. Aumenta los niveles de colesterol bueno. [place] la presin arterial. Disminuye la glucemia. Cul es mi plan de Kandace Blitz? El mdico o un educador para la diabetes certificado pueden ayudarlo a Engineer, petroleum del tipo y de la frecuencia de actividad fsica adecuado para usted. Esto se denomina plan de [place]. Asegrese de lo siguiente: Haga por lo menos 150 minutos semanales de ejercicios de intensidad media o alta. Los ejercicios pueden incluir caminar a paso rpido, andar en bicicleta o hacer gimnasia aerbica en el agua. Haga ejercicios de elongacin y de fortalecimiento, como yoga o levantamiento de pesas, por lo menos 2 veces por semana. Reparta la [employer] en al menos 3 das de la [place]. Haga algn tipo de actividad fsica Armed forces operational officer. No deje pasar ms de 2 das seguidos sin hacer algn tipo de actividad fsica. No permanezca inactivo durante ms de 90  minutos seguidos. Tmese descansos frecuentes para caminar o estirarse. Elija ejercicios o actividades que disfrute. Establezca objetivos realistas. Comience lentamente y aumente de [place] gradual la intensidad de la actividad fsica con el correr del [place]. Cmo controlo la diabetes durante la actividad fsica? Controlar su nivel de glucemia Contrlese la glucemia antes y despus de ejercitarse. Si el nivel de glucemia es: 240 mg/dl (13.3 mmol/l) o ms antes de comenzar a [employer] fsica, controle la orina para detectar la presencia de cetonas. Estas son sustancias qumicas producidas por el hgado. Si tiene [employer] orina, no haga ejercicio hasta que la glucemia se normalice. 100 mg/dl (5.6 mmol/l) o menos, tome una colacin que [employer] 15 y 37 gramos de carbohidratos. Controle la glucemia 15 minutos despus de la colacin para asegurarse de que el nivel de glucosa est por encima de 100 mg/dl (5.6 mmol/l) antes de comenzar a hacer actividad fsica. Conozca los sntomas de la glucemia baja (hipoglucemia) y aprenda cmo tratarla. El riesgo de tener hipoglucemia [place] durante y despus de hacer actividad fsica. Siga estos consejos y las instrucciones del mdico Tenga una colacin de carbohidratos que sea de accin rpida antes, durante y despus de ejercitarse, a fin de evitar o tratar la hipoglucemia. Evite inyectarse insulina en las zonas del cuerpo que ejercitar. Por ejemplo, evite inyectarse insulina en: Los brazos, cuando est por jugar al tenis. Las piernas, cuando est por irse a trotar. Lleve registros de sus hbitos de actividad fsica. Esto puede ayudarlos a usted y al mdico a Tax adviser de control de la diabetes segn sea necesario. [employer] los siguientes datos: Los [employer]  consume antes y despus de hacer actividad fsica. Los niveles de glucemia antes y despus de hacer ejercicios. El tipo y cantidad de [place] fsica que Musician. Trabaje con el  mdico cuando comience un nuevo tipo de actividad fsica o ejercicio. Es posible que el mdico deba hacer lo siguiente: Asegurarse de que la actividad sea segura para usted. Ajustar la insulina, los otros medicamentos y los alimentos que usted consume. Beba mucha agua mientras hace ejercicio. Esto previene la prdida de agua (deshidratacin) y los problemas causados por mucho calor en el cuerpo (golpe de calor). Dnde buscar ms informacin American Diabetes Association (Asociacin Estadounidense de la Diabetes): www.diabetes.org Resumen Hacer actividad fsica habitualmente es importante para el estado de salud general, en especial para las personas que tienen diabetes mellitus. Hacer actividad fsica tiene muchos beneficios para [employer]	[place]. [place]umenta la fuerza muscular y la densidad sea, y reduce las grasas corporales y Dealer. Tambin disminuye y controla la glucemia. El mdico o un educador para la diabetes certificado puede ayudarlo a Paediatric nurse un plan de actividades respecto del tipo y de la frecuencia de actividad fsica adecuados para usted. Consulte al mdico para asegurarse de que cualquier actividad nueva sea segura para usted. Tambin trabaje con el mdico para ajustar la [place], los otros medicamentos y los alimentos que consume. Esta informacin no tiene Marine scientist el consejo del mdico. Asegrese de hacerle al mdico cualquier pregunta que tenga. Document Revised: 06/21/2019 Document Reviewed: 06/21/2019 Elsevier Patient Education  2022 [employer].

## 2021-08-31 ENCOUNTER — Other Ambulatory Visit: Payer: Self-pay

## 2021-08-31 LAB — CMP14+EGFR
ALT: 61 IU/L — ABNORMAL HIGH (ref 0–32)
AST: 59 IU/L — ABNORMAL HIGH (ref 0–40)
Albumin/Globulin Ratio: 1.5 (ref 1.2–2.2)
Albumin: 3.8 g/dL (ref 3.8–4.8)
Alkaline Phosphatase: 83 IU/L (ref 44–121)
BUN/Creatinine Ratio: 20 (ref 9–23)
BUN: 12 mg/dL (ref 6–24)
Bilirubin Total: <0.2 mg/dL (ref 0.0–1.2)
CO2: 21 mmol/L (ref 20–29)
Calcium: 8.8 mg/dL (ref 8.7–10.2)
Chloride: 99 mmol/L (ref 96–106)
Creatinine, Ser: 0.6 mg/dL (ref 0.57–1.00)
Globulin, Total: 2.5 g/dL (ref 1.5–4.5)
Glucose: 269 mg/dL — ABNORMAL HIGH (ref 70–99)
Potassium: 4.2 mmol/L (ref 3.5–5.2)
Sodium: 136 mmol/L (ref 134–144)
Total Protein: 6.3 g/dL (ref 6.0–8.5)
eGFR: 112 mL/min/{1.73_m2} (ref >59)

## 2021-08-31 LAB — CBC WITH DIFFERENTIAL/PLATELET
Basophils Absolute: 0 10*3/uL (ref 0.0–0.2)
Basos: 0 %
EOS (ABSOLUTE): 0.4 10*3/uL (ref 0.0–0.4)
Eos: 5 %
Hematocrit: 36.8 % (ref 34.0–46.6)
Hemoglobin: 11 g/dL — ABNORMAL LOW (ref 11.1–15.9)
Immature Grans (Abs): 0 10*3/uL (ref 0.0–0.1)
Immature Granulocytes: 0 %
Lymphocytes Absolute: 2.1 10*3/uL (ref 0.7–3.1)
Lymphs: 30 %
MCH: 22.6 pg — ABNORMAL LOW (ref 26.6–33.0)
MCHC: 29.9 g/dL — ABNORMAL LOW (ref 31.5–35.7)
MCV: 76 fL — ABNORMAL LOW (ref 79–97)
Monocytes Absolute: 0.5 10*3/uL (ref 0.1–0.9)
Monocytes: 7 %
Neutrophils Absolute: 4 10*3/uL (ref 1.4–7.0)
Neutrophils: 58 %
Platelets: 326 10*3/uL (ref 150–450)
RBC: 4.87 x10E6/uL (ref 3.77–5.28)
RDW: 15.2 % (ref 11.7–15.4)
WBC: 7 10*3/uL (ref 3.4–10.8)

## 2021-08-31 LAB — LIPID PANEL
Chol/HDL Ratio: 8.3 ratio — ABNORMAL HIGH (ref 0.0–4.4)
Cholesterol, Total: 217 mg/dL — ABNORMAL HIGH (ref 100–199)
HDL: 26 mg/dL — ABNORMAL LOW (ref >39)
LDL Chol Calc (NIH): 121 mg/dL — ABNORMAL HIGH (ref 0–99)
Triglycerides: 392 mg/dL — ABNORMAL HIGH (ref 0–149)
VLDL Cholesterol Cal: 70 mg/dL — ABNORMAL HIGH (ref 5–40)

## 2021-09-01 ENCOUNTER — Other Ambulatory Visit (INDEPENDENT_AMBULATORY_CARE_PROVIDER_SITE_OTHER): Payer: Self-pay | Admitting: Primary Care

## 2021-09-01 ENCOUNTER — Other Ambulatory Visit: Payer: Self-pay

## 2021-09-01 DIAGNOSIS — E782 Mixed hyperlipidemia: Secondary | ICD-10-CM

## 2021-09-01 MED ORDER — ATORVASTATIN CALCIUM 40 MG PO TABS
40.0000 mg | ORAL_TABLET | Freq: Every day | ORAL | 1 refills | Status: DC
  Filled 2021-09-01: qty 90, 90d supply, fill #0
  Filled 2021-09-13: qty 30, 30d supply, fill #0
  Filled 2021-10-06: qty 30, 30d supply, fill #1
  Filled 2021-11-03: qty 30, 30d supply, fill #2
  Filled 2021-12-06: qty 30, 30d supply, fill #3
  Filled 2022-01-03: qty 30, 30d supply, fill #4
  Filled 2022-01-31: qty 30, 30d supply, fill #5

## 2021-09-03 ENCOUNTER — Telehealth (INDEPENDENT_AMBULATORY_CARE_PROVIDER_SITE_OTHER): Payer: Self-pay

## 2021-09-03 NOTE — Telephone Encounter (Signed)
-----   Message from Kerin Perna, NP sent at 09/01/2021  3:44 PM EST ----- ?Your cholesterol is high, Increase risk of heart attack and/or stroke.  ?To reduce your Cholesterol , Remember - more fruits and vegetables, more fish, and limit red meat and dairy products. ?More soy, nuts, beans, barley, lentils, oats and plant sterol ester enriched margarine instead of butter. ?I also encourage eliminating sugar and processed food. ?take atorvastin 40mg  at bedtime  ? ?AST and ALT both elevated. These are enzymes used to check liver function.  ?Some causes of an elevation of AST/ALT may be increased alcohol consumption, over use of Tylenol  ? ?

## 2021-09-03 NOTE — Telephone Encounter (Signed)
Call placed to patient with assistance of pacific interpreter [phone]) patient aware of all results per PCP. Nat Christen, CMA  ?

## 2021-09-03 NOTE — Telephone Encounter (Signed)
Patient has questions about cholesterol medication. Per chart patient is to be on 2 medications for cholesterol. Fenofibrate only prescribed for 30 days no refills. She will need refills if she is to continue taking. Nat Christen, CMA  ?

## 2021-09-06 ENCOUNTER — Other Ambulatory Visit (INDEPENDENT_AMBULATORY_CARE_PROVIDER_SITE_OTHER): Payer: Self-pay | Admitting: Primary Care

## 2021-09-06 MED ORDER — FENOFIBRATE 145 MG PO TABS
ORAL_TABLET | Freq: Every day | ORAL | 1 refills | Status: DC
  Filled 2021-09-06: qty 90, fill #0
  Filled 2021-10-06: qty 30, 30d supply, fill #0
  Filled 2021-11-03: qty 30, 30d supply, fill #1
  Filled 2021-12-06: qty 30, 30d supply, fill #2

## 2021-09-06 NOTE — Telephone Encounter (Signed)
Patient should be taking 2 medicationsatorvastatin '40mg'$  and Tricor '145mg'$  daily ?

## 2021-09-07 ENCOUNTER — Other Ambulatory Visit: Payer: Self-pay

## 2021-09-10 NOTE — Telephone Encounter (Signed)
Patient made aware that she should be taking 2 medications for cholesterol. She is aware of what the names of both medications are and that both have been sent to the pharmacy.  ? ?Made aware with assistance of pacific [phone]).  ?

## 2021-09-13 ENCOUNTER — Other Ambulatory Visit (INDEPENDENT_AMBULATORY_CARE_PROVIDER_SITE_OTHER): Payer: Self-pay | Admitting: Primary Care

## 2021-09-13 ENCOUNTER — Other Ambulatory Visit: Payer: Self-pay

## 2021-09-13 DIAGNOSIS — E1165 Type 2 diabetes mellitus with hyperglycemia: Secondary | ICD-10-CM

## 2021-09-13 NOTE — Telephone Encounter (Signed)
Sent to PCP ?

## 2021-09-14 ENCOUNTER — Other Ambulatory Visit: Payer: Self-pay

## 2021-09-14 MED ORDER — GABAPENTIN 300 MG PO CAPS
300.0000 mg | ORAL_CAPSULE | Freq: Three times a day (TID) | ORAL | 0 refills | Status: DC
  Filled 2021-09-14: qty 90, 30d supply, fill #0

## 2021-10-06 ENCOUNTER — Other Ambulatory Visit (INDEPENDENT_AMBULATORY_CARE_PROVIDER_SITE_OTHER): Payer: Self-pay | Admitting: Primary Care

## 2021-10-06 ENCOUNTER — Other Ambulatory Visit: Payer: Self-pay

## 2021-10-06 DIAGNOSIS — E1165 Type 2 diabetes mellitus with hyperglycemia: Secondary | ICD-10-CM

## 2021-10-06 MED ORDER — GABAPENTIN 300 MG PO CAPS
300.0000 mg | ORAL_CAPSULE | Freq: Three times a day (TID) | ORAL | 1 refills | Status: DC
  Filled 2021-10-06: qty 90, 30d supply, fill #0
  Filled 2021-11-03: qty 90, 30d supply, fill #1

## 2021-10-06 MED ORDER — OMEPRAZOLE 20 MG PO CPDR
20.0000 mg | DELAYED_RELEASE_CAPSULE | Freq: Every day | ORAL | 0 refills | Status: DC
  Filled 2021-10-06 – 2021-12-06 (×2): qty 90, 90d supply, fill #0

## 2021-10-06 NOTE — Telephone Encounter (Signed)
Routed to PCP 

## 2021-11-03 ENCOUNTER — Other Ambulatory Visit: Payer: Self-pay

## 2021-11-08 ENCOUNTER — Emergency Department (HOSPITAL_COMMUNITY): Payer: Self-pay

## 2021-11-08 ENCOUNTER — Encounter (HOSPITAL_COMMUNITY): Payer: Self-pay | Admitting: Emergency Medicine

## 2021-11-08 ENCOUNTER — Emergency Department (HOSPITAL_COMMUNITY)
Admission: EM | Admit: 2021-11-08 | Discharge: 2021-11-08 | Disposition: A | Discharge status: Home / Self Care | Payer: Self-pay | Attending: Emergency Medicine | Admitting: Emergency Medicine

## 2021-11-08 ENCOUNTER — Ambulatory Visit (INDEPENDENT_AMBULATORY_CARE_PROVIDER_SITE_OTHER): Payer: Self-pay

## 2021-11-08 DIAGNOSIS — S060X9A Concussion with loss of consciousness of unspecified duration, initial encounter: Secondary | ICD-10-CM | POA: Insufficient documentation

## 2021-11-08 DIAGNOSIS — Z794 Long term (current) use of insulin: Secondary | ICD-10-CM | POA: Insufficient documentation

## 2021-11-08 DIAGNOSIS — S8000XA Contusion of unspecified knee, initial encounter: Secondary | ICD-10-CM

## 2021-11-08 DIAGNOSIS — Z7984 Long term (current) use of oral hypoglycemic drugs: Secondary | ICD-10-CM | POA: Insufficient documentation

## 2021-11-08 DIAGNOSIS — S8001XA Contusion of right knee, initial encounter: Secondary | ICD-10-CM | POA: Insufficient documentation

## 2021-11-08 DIAGNOSIS — E119 Type 2 diabetes mellitus without complications: Secondary | ICD-10-CM | POA: Insufficient documentation

## 2021-11-08 DIAGNOSIS — S8002XA Contusion of left knee, initial encounter: Secondary | ICD-10-CM | POA: Insufficient documentation
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
## 2021-11-08 MED ORDER — NAPROXEN 375 MG PO TABS
375.0000 mg | ORAL_TABLET | Freq: Two times a day (BID) | ORAL | 0 refills | Status: DC
Start: 2021-11-08 — End: 2021-12-24
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
## 2021-11-08 NOTE — ED Notes (Signed)
All discharge instructions including follow up care and prescriptions reviewed with patient and patient verbalized understanding of same. Patient stable and ambulatory at time of discharge.  

## 2021-11-08 NOTE — Telephone Encounter (Signed)
FYI. Please advise should patient go to ED  ?

## 2021-12-01 ENCOUNTER — Ambulatory Visit (INDEPENDENT_AMBULATORY_CARE_PROVIDER_SITE_OTHER): Payer: Self-pay | Admitting: Primary Care

## 2021-12-06 ENCOUNTER — Other Ambulatory Visit (INDEPENDENT_AMBULATORY_CARE_PROVIDER_SITE_OTHER): Payer: Self-pay | Admitting: Primary Care

## 2021-12-06 ENCOUNTER — Other Ambulatory Visit: Payer: Self-pay

## 2021-12-06 DIAGNOSIS — E1165 Type 2 diabetes mellitus with hyperglycemia: Secondary | ICD-10-CM

## 2021-12-07 ENCOUNTER — Other Ambulatory Visit: Payer: Self-pay

## 2021-12-20 ENCOUNTER — Emergency Department (HOSPITAL_COMMUNITY): Payer: Self-pay

## 2021-12-20 ENCOUNTER — Other Ambulatory Visit: Payer: Self-pay

## 2021-12-20 ENCOUNTER — Encounter (HOSPITAL_COMMUNITY): Payer: Self-pay

## 2021-12-20 ENCOUNTER — Inpatient Hospital Stay (HOSPITAL_COMMUNITY)
Admission: EM | Admit: 2021-12-20 | Discharge: 2021-12-24 | DRG: 065 | Disposition: A | Discharge status: Home / Self Care | Payer: Self-pay | Attending: Internal Medicine | Admitting: Internal Medicine

## 2021-12-20 DIAGNOSIS — I63432 Cerebral infarction due to embolism of left posterior cerebral artery: Principal | ICD-10-CM | POA: Diagnosis present

## 2021-12-20 DIAGNOSIS — Z833 Family history of diabetes mellitus: Secondary | ICD-10-CM

## 2021-12-20 DIAGNOSIS — Z91148 Patient's other noncompliance with medication regimen for other reason: Secondary | ICD-10-CM

## 2021-12-20 DIAGNOSIS — I639 Cerebral infarction, unspecified: Secondary | ICD-10-CM | POA: Diagnosis present

## 2021-12-20 DIAGNOSIS — R059 Cough, unspecified: Secondary | ICD-10-CM

## 2021-12-20 DIAGNOSIS — Q2112 Patent foramen ovale: Secondary | ICD-10-CM

## 2021-12-20 DIAGNOSIS — Z8249 Family history of ischemic heart disease and other diseases of the circulatory system: Secondary | ICD-10-CM

## 2021-12-20 DIAGNOSIS — K219 Gastro-esophageal reflux disease without esophagitis: Secondary | ICD-10-CM | POA: Diagnosis present

## 2021-12-20 DIAGNOSIS — D509 Iron deficiency anemia, unspecified: Secondary | ICD-10-CM | POA: Diagnosis present

## 2021-12-20 DIAGNOSIS — R29701 NIHSS score 1: Secondary | ICD-10-CM | POA: Diagnosis present

## 2021-12-20 DIAGNOSIS — Z794 Long term (current) use of insulin: Secondary | ICD-10-CM

## 2021-12-20 DIAGNOSIS — Z7984 Long term (current) use of oral hypoglycemic drugs: Secondary | ICD-10-CM

## 2021-12-20 DIAGNOSIS — I10 Essential (primary) hypertension: Secondary | ICD-10-CM | POA: Diagnosis present

## 2021-12-20 DIAGNOSIS — E1140 Type 2 diabetes mellitus with diabetic neuropathy, unspecified: Secondary | ICD-10-CM | POA: Diagnosis present

## 2021-12-20 DIAGNOSIS — F17200 Nicotine dependence, unspecified, uncomplicated: Secondary | ICD-10-CM | POA: Diagnosis present

## 2021-12-20 DIAGNOSIS — J45909 Unspecified asthma, uncomplicated: Secondary | ICD-10-CM | POA: Diagnosis present

## 2021-12-20 DIAGNOSIS — Z79899 Other long term (current) drug therapy: Secondary | ICD-10-CM

## 2021-12-20 DIAGNOSIS — I253 Aneurysm of heart: Secondary | ICD-10-CM | POA: Diagnosis present

## 2021-12-20 DIAGNOSIS — E1159 Type 2 diabetes mellitus with other circulatory complications: Secondary | ICD-10-CM | POA: Diagnosis present

## 2021-12-20 DIAGNOSIS — H53461 Homonymous bilateral field defects, right side: Secondary | ICD-10-CM | POA: Diagnosis present

## 2021-12-20 DIAGNOSIS — I3139 Other pericardial effusion (noninflammatory): Secondary | ICD-10-CM | POA: Diagnosis present

## 2021-12-20 DIAGNOSIS — Z808 Family history of malignant neoplasm of other organs or systems: Secondary | ICD-10-CM

## 2021-12-20 DIAGNOSIS — Z8639 Personal history of other endocrine, nutritional and metabolic disease: Secondary | ICD-10-CM

## 2021-12-20 DIAGNOSIS — E1165 Type 2 diabetes mellitus with hyperglycemia: Secondary | ICD-10-CM | POA: Diagnosis present

## 2021-12-20 DIAGNOSIS — E785 Hyperlipidemia, unspecified: Secondary | ICD-10-CM | POA: Diagnosis present

## 2021-12-20 DIAGNOSIS — F17210 Nicotine dependence, cigarettes, uncomplicated: Secondary | ICD-10-CM | POA: Diagnosis present

## 2021-12-20 LAB — CBG MONITORING, ED
Glucose-Capillary: 199 mg/dL — ABNORMAL HIGH (ref 70–99)
Glucose-Capillary: 230 mg/dL — ABNORMAL HIGH (ref 70–99)

## 2021-12-20 LAB — CBC WITH DIFFERENTIAL/PLATELET
Abs Immature Granulocytes: 0.02 10*3/uL (ref 0.00–0.07)
Basophils Absolute: 0 10*3/uL (ref 0.0–0.1)
Basophils Relative: 0 %
Eosinophils Absolute: 0.4 10*3/uL (ref 0.0–0.5)
Eosinophils Relative: 5 %
HCT: 36.8 % (ref 36.0–46.0)
Hemoglobin: 11.1 g/dL — ABNORMAL LOW (ref 12.0–15.0)
Immature Granulocytes: 0 %
Lymphocytes Relative: 42 %
Lymphs Abs: 3.5 10*3/uL (ref 0.7–4.0)
MCH: 22.5 pg — ABNORMAL LOW (ref 26.0–34.0)
MCHC: 30.2 g/dL (ref 30.0–36.0)
MCV: 74.6 fL — ABNORMAL LOW (ref 80.0–100.0)
Monocytes Absolute: 0.5 10*3/uL (ref 0.1–1.0)
Monocytes Relative: 6 %
Neutro Abs: 3.9 10*3/uL (ref 1.7–7.7)
Neutrophils Relative %: 47 %
Platelets: 361 10*3/uL (ref 150–400)
RBC: 4.93 MIL/uL (ref 3.87–5.11)
RDW: 15.2 % (ref 11.5–15.5)
WBC: 8.2 10*3/uL (ref 4.0–10.5)
nRBC: 0 % (ref 0.0–0.2)

## 2021-12-20 LAB — BASIC METABOLIC PANEL
Anion gap: 13 (ref 5–15)
BUN: 16 mg/dL (ref 6–20)
CO2: 20 mmol/L — ABNORMAL LOW (ref 22–32)
Calcium: 8.8 mg/dL — ABNORMAL LOW (ref 8.9–10.3)
Chloride: 103 mmol/L (ref 98–111)
Creatinine, Ser: 0.81 mg/dL (ref 0.44–1.00)
GFR, Estimated: >60 mL/min (ref >60)
Glucose, Bld: 216 mg/dL — ABNORMAL HIGH (ref 70–99)
Potassium: 3.8 mmol/L (ref 3.5–5.1)
Sodium: 136 mmol/L (ref 135–145)

## 2021-12-20 IMAGING — CT CT HEAD W/O CM
3 of 4 series · 15 of 47 positions shown, 18 images · non-contrast
Comparison: CT head [DATE]

CLINICAL DATA: Diplopia



[Series 3: head 2.0 h70h · axial · 0.42mm/px · z∈[-107,+19]mm · 9 of 79 slices shown, 12 images]
[im 8/79  brain]
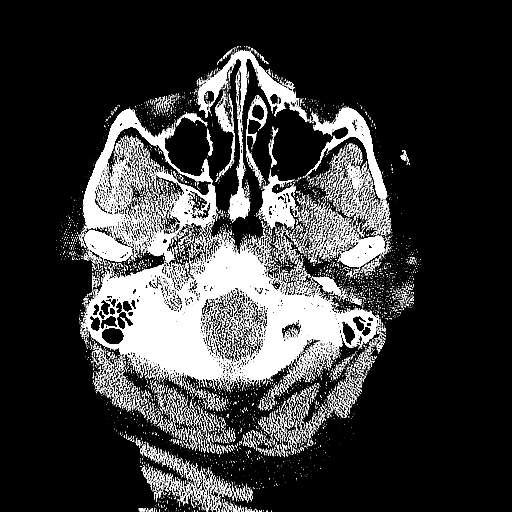
[im 8/79  bone]
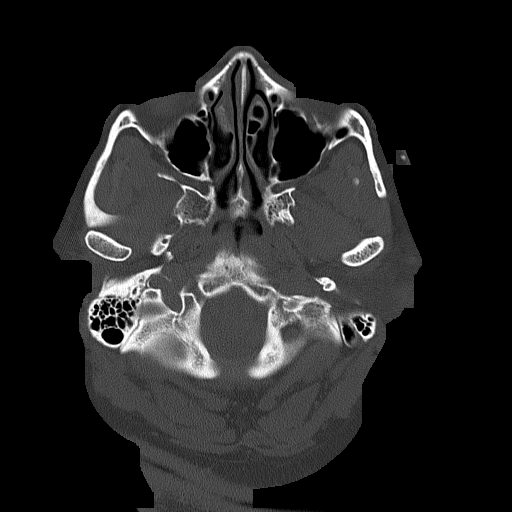
[im 16/79  brain]
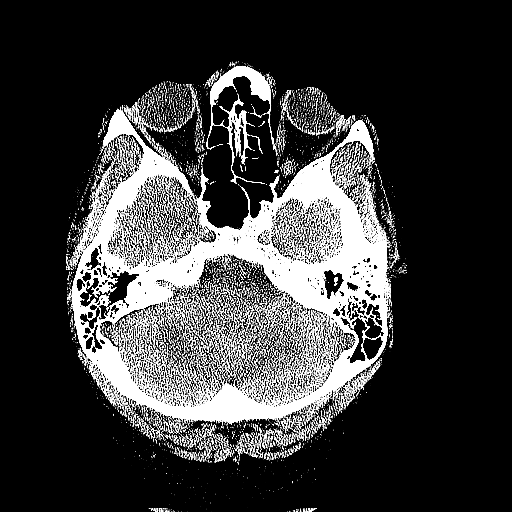
[im 24/79  brain]
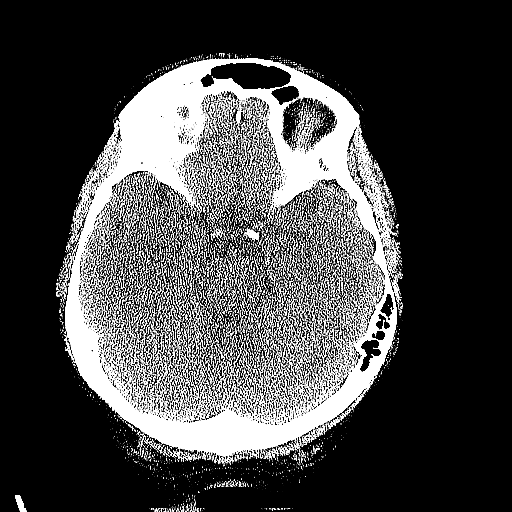
[im 32/79  brain]
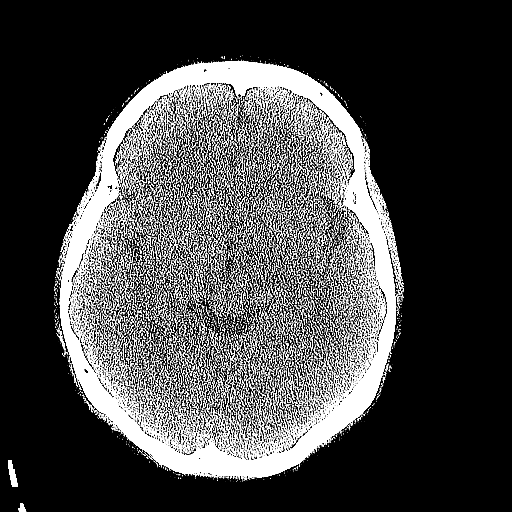
[im 40/79  brain]
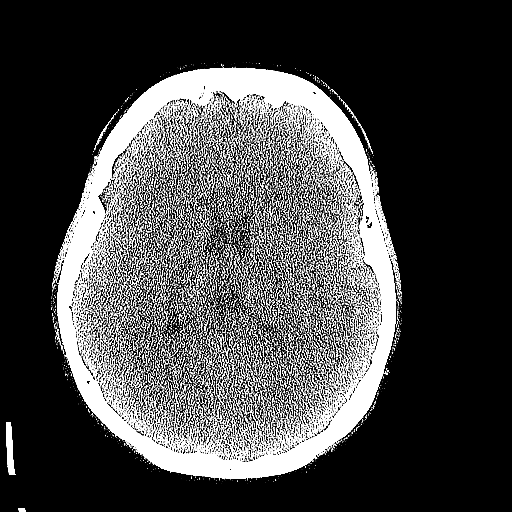
[im 40/79  bone]
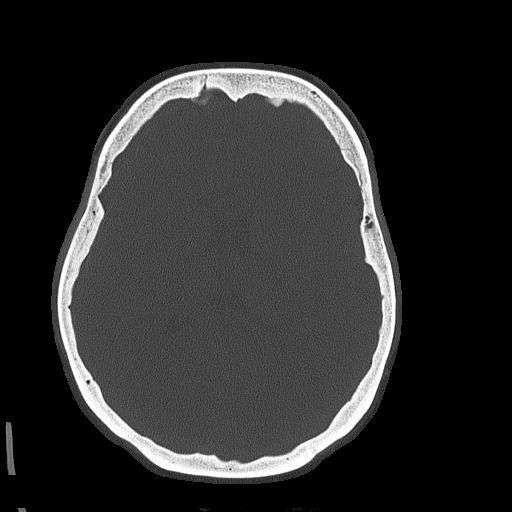
[im 47/79  brain]
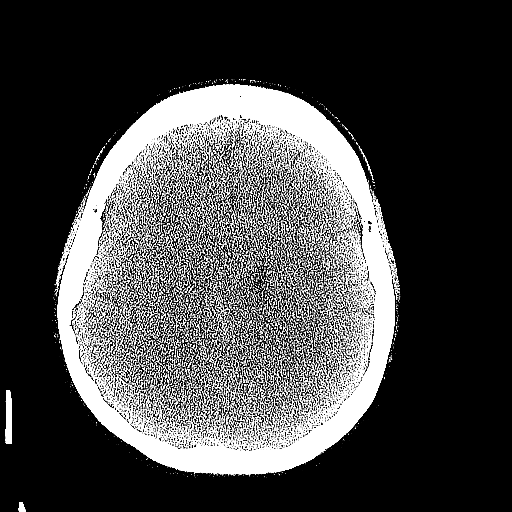
[im 55/79  brain]
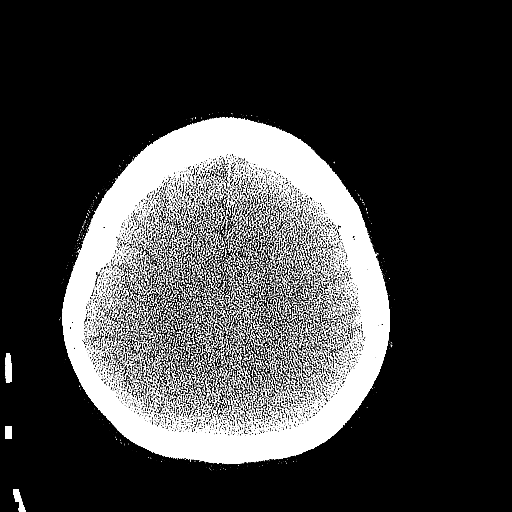
[im 63/79  brain]
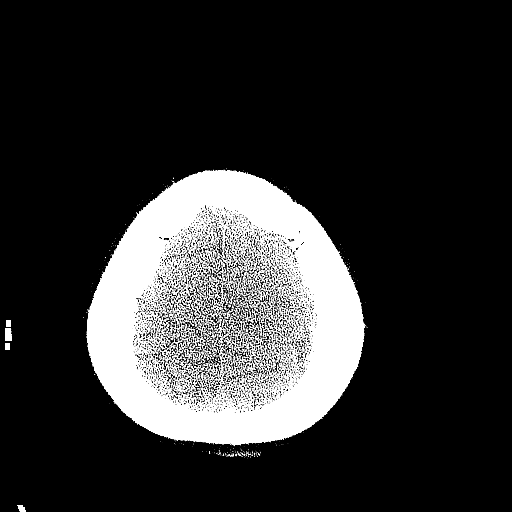
[im 71/79  brain]
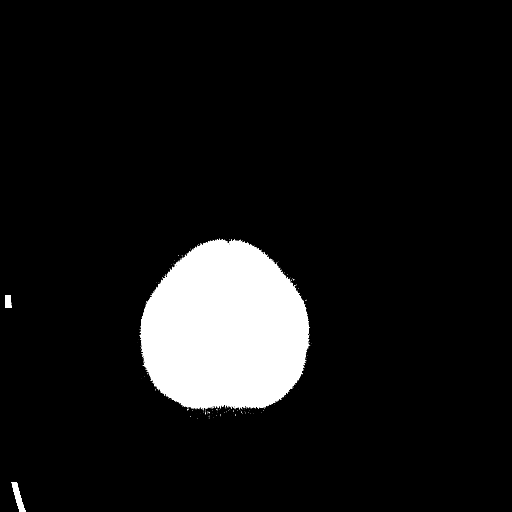
[im 71/79  bone]
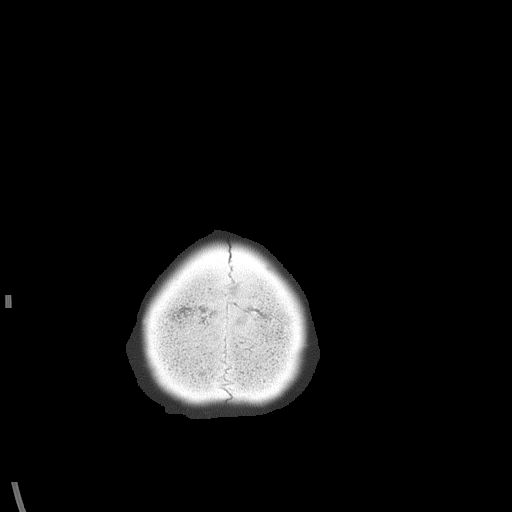

[Series 5: head 3.0 mpr cor · coronal · 0.31mm/px · 3 of 67 slices shown]
[im 23/67  brain]
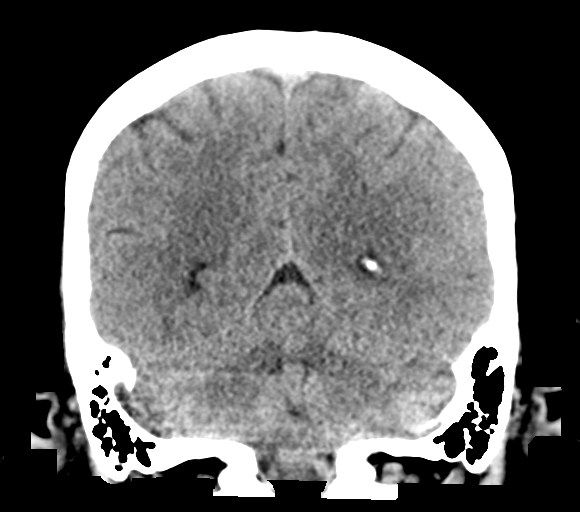
[im 30/67  brain]
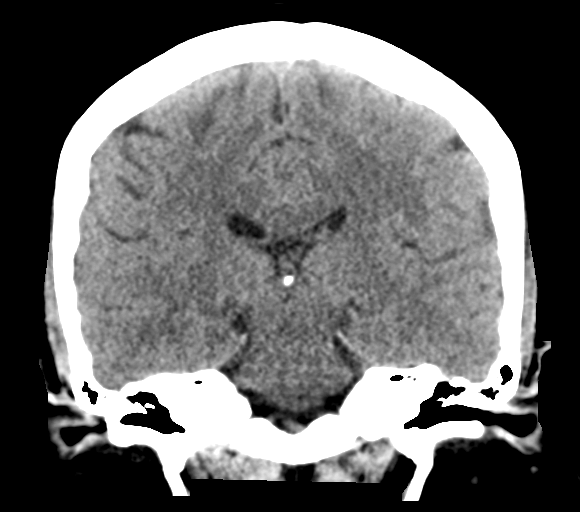
[im 37/67  brain]
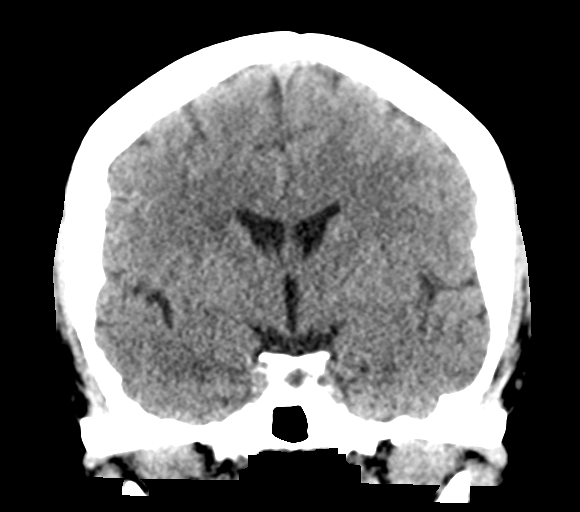

[Series 6: head 3.0 mpr sag · sagittal · 0.32mm/px · 3 of 57 slices shown]
[im 19/57  brain]
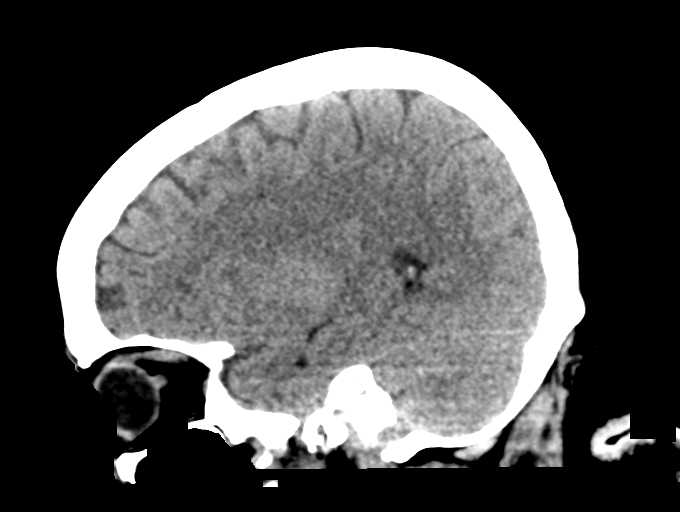
[im 29/57  brain]
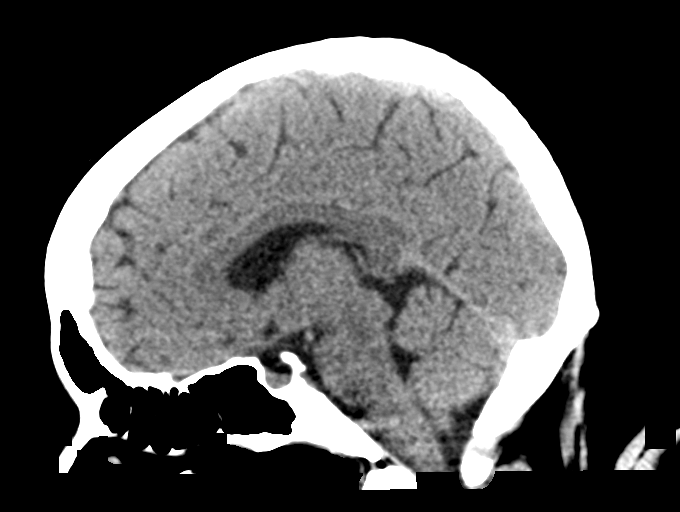
[im 38/57  brain]
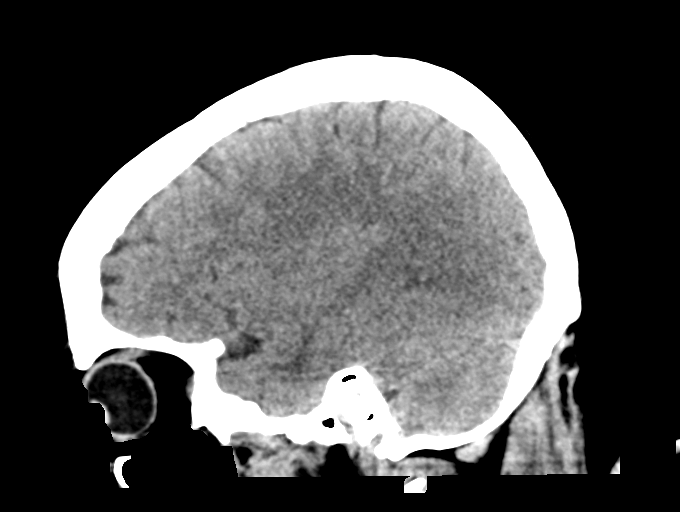

[15 of 47 positions shown; findings below may reference images not displayed]

FINDINGS: Brain: No acute intracranial hemorrhage, mass effect, or herniation.
No extra-axial fluid collections. No evidence of acute territorial
infarct. No hydrocephalus. Stable small patchy hypodensities in the
right basal ganglia and corona radiata. Chronic punctate
calcification in the left basal ganglia.

Vascular: No hyperdense vessel or unexpected calcification.

Skull: Normal. Negative for fracture or focal lesion.

Sinuses/Orbits: No acute finding.

Other: None.
IMPRESSION: Chronic changes with no acute intracranial process identified.

## 2021-12-20 NOTE — ED Triage Notes (Signed)
Pt came in POV with c/o HA she's had since noon yesterday & she has been seeing lights in her vision, endorses feeling "woozy" & like she is unsteady on her feet. Endorses feeling thirsty a lot yesterday. A/Ox4. CBG in triage 199.

## 2021-12-20 NOTE — ED Provider Triage Note (Signed)
Emergency Medicine Provider Triage Evaluation Note  Julie Robertson , a 46 y.o. female  was evaluated in triage.  Pt complains of blurry vision, headache for the past 28 hours.  Reports seeing "lights" in both of her eyes that is worse on the right side.  She denies any numbness or weakness, vomiting, injuries or falls.  Review of Systems  Positive: Headache, visual disturbance Negative: Numbness, weakness  Physical Exam  BP 119/72 (BP Location: Left Arm)   Pulse 77   Temp 98.2 F (36.8 C) (Oral)   Resp 16   SpO2 100%  Gen:   Awake, no distress   Resp:  Normal effort  MSK:   Moves extremities without difficulty  Other:  No visual field deficit.  No facial asymmetry.  Normal speech.  Strength 5/5 in bilateral upper and lower extremities  Medical Decision Making  Medically screening exam initiated at 4:10 PM.  Appropriate orders placed.  [name] was informed that the remainder of the evaluation will be completed by another provider, this initial triage assessment does not replace that evaluation, and the importance of remaining in the ED until their evaluation is complete.  Imaging and work-up ordered   Delia Heady, PA-C 12/20/21 1617

## 2021-12-21 ENCOUNTER — Inpatient Hospital Stay (HOSPITAL_COMMUNITY): Payer: Self-pay

## 2021-12-21 ENCOUNTER — Emergency Department (HOSPITAL_COMMUNITY): Payer: Self-pay

## 2021-12-21 ENCOUNTER — Encounter (HOSPITAL_COMMUNITY): Payer: Self-pay | Admitting: Family Medicine

## 2021-12-21 DIAGNOSIS — I10 Essential (primary) hypertension: Secondary | ICD-10-CM

## 2021-12-21 DIAGNOSIS — I6389 Other cerebral infarction: Secondary | ICD-10-CM

## 2021-12-21 DIAGNOSIS — D509 Iron deficiency anemia, unspecified: Secondary | ICD-10-CM

## 2021-12-21 DIAGNOSIS — Z794 Long term (current) use of insulin: Secondary | ICD-10-CM

## 2021-12-21 DIAGNOSIS — E1165 Type 2 diabetes mellitus with hyperglycemia: Secondary | ICD-10-CM

## 2021-12-21 DIAGNOSIS — I639 Cerebral infarction, unspecified: Secondary | ICD-10-CM | POA: Diagnosis present

## 2021-12-21 DIAGNOSIS — F419 Anxiety disorder, unspecified: Secondary | ICD-10-CM

## 2021-12-21 DIAGNOSIS — Z720 Tobacco use: Secondary | ICD-10-CM

## 2021-12-21 DIAGNOSIS — E785 Hyperlipidemia, unspecified: Secondary | ICD-10-CM

## 2021-12-21 LAB — ECHOCARDIOGRAM COMPLETE
AR max vel: 1.95 cm2
AV Peak grad: 9.7 mmHg
Ao pk vel: 1.56 m/s
Area-P 1/2: 3.27 cm2
Calc EF: 62.7 %
S' Lateral: 3 cm
Single Plane A2C EF: 61.2 %
Single Plane A4C EF: 63.7 %

## 2021-12-21 LAB — LIPID PANEL
Cholesterol: 120 mg/dL (ref 0–200)
HDL: 20 mg/dL — ABNORMAL LOW (ref >40)
LDL Cholesterol: 55 mg/dL (ref 0–99)
Total CHOL/HDL Ratio: 6 RATIO
Triglycerides: 224 mg/dL — ABNORMAL HIGH (ref <150)
VLDL: 45 mg/dL — ABNORMAL HIGH (ref 0–40)

## 2021-12-21 LAB — HEMOGLOBIN A1C
Hgb A1c MFr Bld: 11.6 % — ABNORMAL HIGH (ref 4.8–5.6)
Mean Plasma Glucose: 286.22 mg/dL

## 2021-12-21 LAB — CBG MONITORING, ED
Glucose-Capillary: 306 mg/dL — ABNORMAL HIGH (ref 70–99)
Glucose-Capillary: 259 mg/dL — ABNORMAL HIGH (ref 70–99)
Glucose-Capillary: 196 mg/dL — ABNORMAL HIGH (ref 70–99)

## 2021-12-21 LAB — HIV ANTIBODY (ROUTINE TESTING W REFLEX): HIV Screen 4th Generation wRfx: NONREACTIVE

## 2021-12-21 LAB — I-STAT BETA HCG BLOOD, ED (MC, WL, AP ONLY): I-stat hCG, quantitative: <5 m[IU]/mL (ref <5)

## 2021-12-21 IMAGING — MR MR ORBITS WO/W CM
8 of 10 series · 34 of 48 positions shown · IV contrast (gadavist)
Comparison: Head CT from yesterday

CLINICAL DATA: Blurry vision and headache

EXAM:
MRI OF THE ORBITS WITHOUT AND WITH CONTRAST
TECHNIQUE: Multiplanar, multi-echo pulse sequences of the orbits and
surrounding structures were acquired including fat saturation
techniques, before and after intravenous contrast administration.
CONTRAST:  10mL GADAVIST GADOBUTROL 1 MMOL/ML IV SOLN

[Series 5: T1 · axial · non-contrast · 3.0mm · 0.35mm/px · z∈[-41,+13]mm · 4 of 14 slices shown (1 of 2)]
[im 1/14]
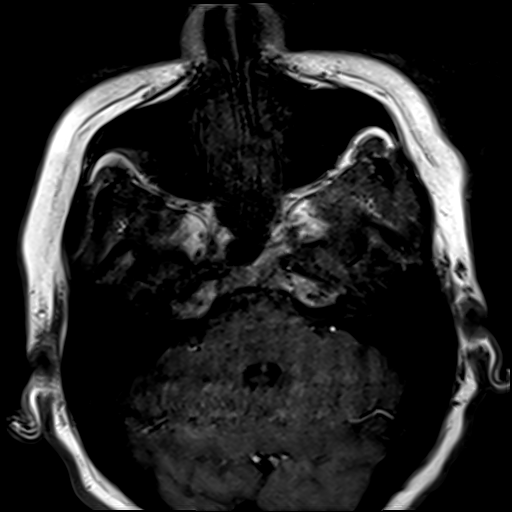
[im 5/14]
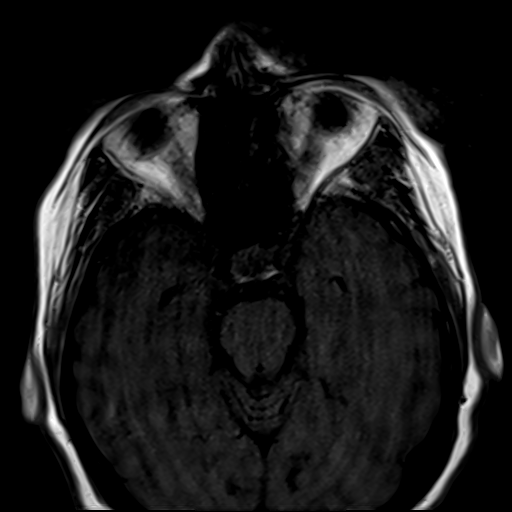
[im 9/14]
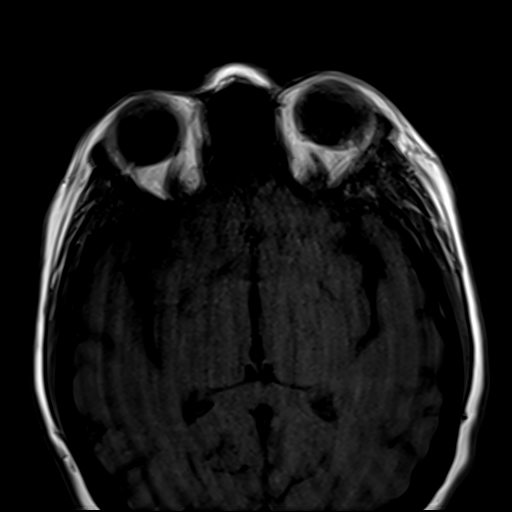
[im 14/14]
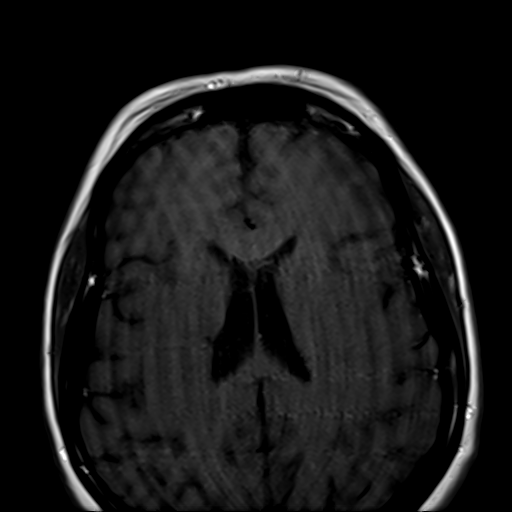

[Series 6: T2 fat-sat · axial · 3.0mm · 0.54mm/px · z∈[-41,+13]mm · 4 of 14 slices shown (1 of 6)]
[im 1/14]
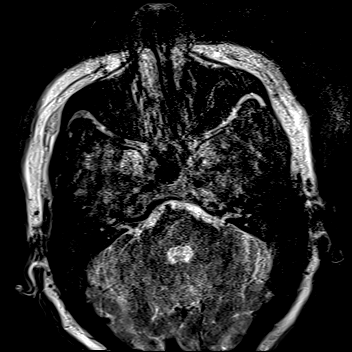
[im 5/14]
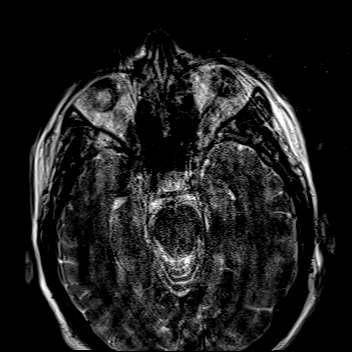
[im 9/14]
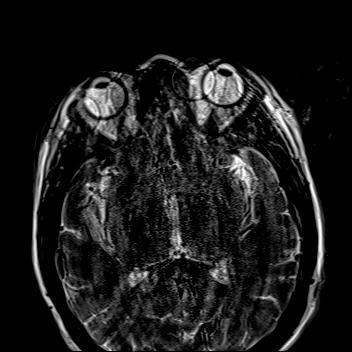
[im 14/14]
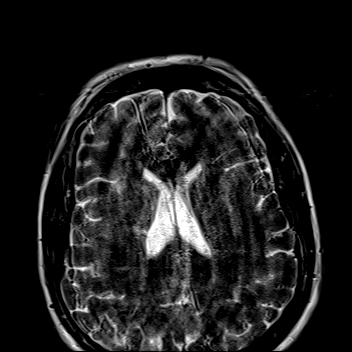

[Series 7: T2 fat-sat · axial · 3.0mm · 0.54mm/px · z∈[-41,+13]mm · 4 of 14 slices shown (2 of 6)]
[im 1/14]
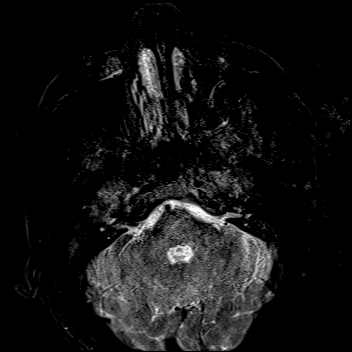
[im 5/14]
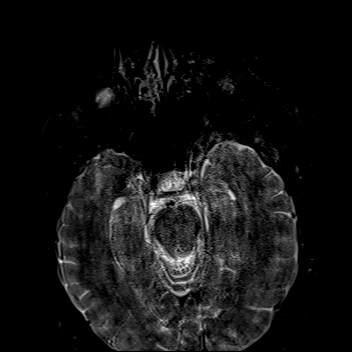
[im 9/14]
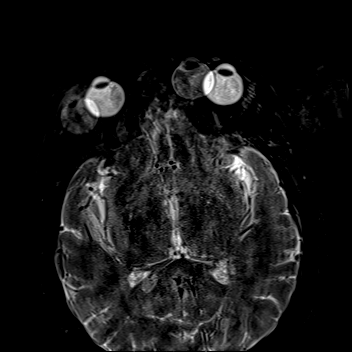
[im 14/14]
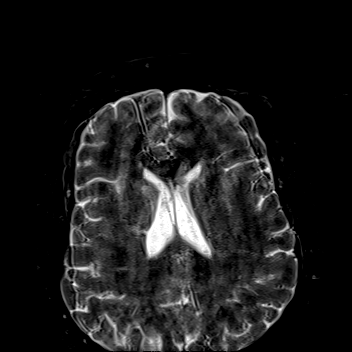

[Series 8: T2 fat-sat · axial · 3.0mm · 0.54mm/px · z∈[-41,+13]mm · 3 of 14 slices shown (3 of 6)]
[im 1/14]
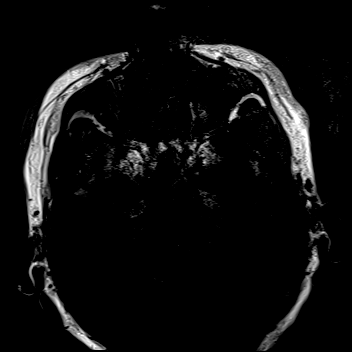
[im 7/14]
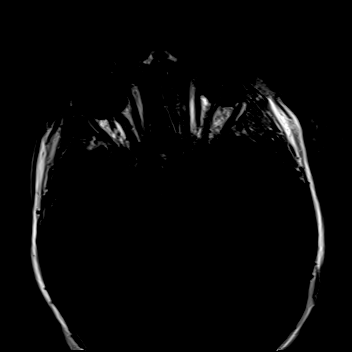
[im 14/14]
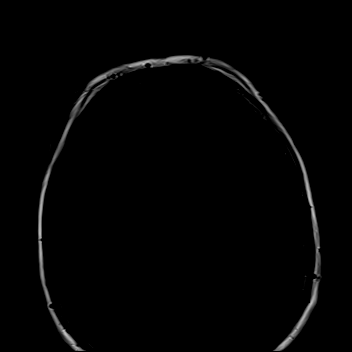

[Series 12: T2 fat-sat · coronal · 3.0mm · 0.54mm/px · 6 of 25 slices shown (4 of 6)]
[im 1/25]
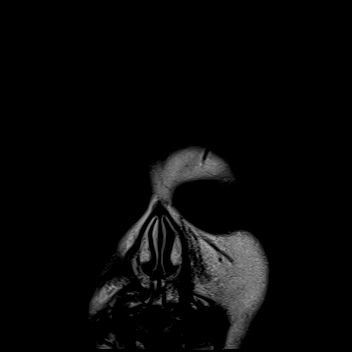
[im 5/25]
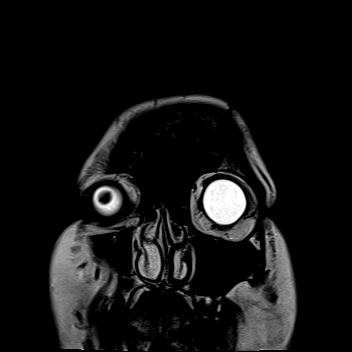
[im 10/25]
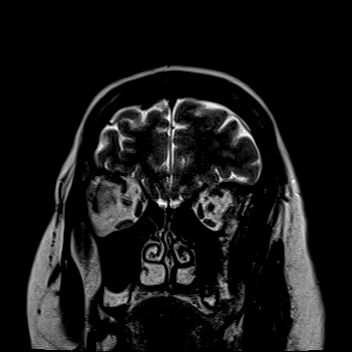
[im 15/25]
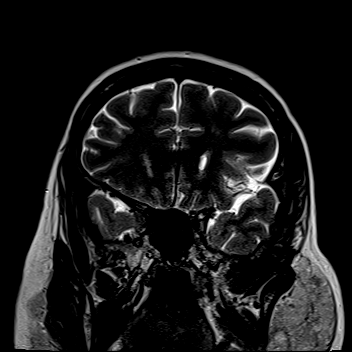
[im 20/25]
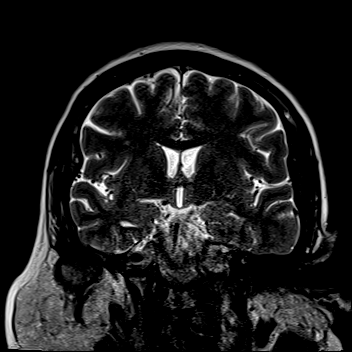
[im 25/25]
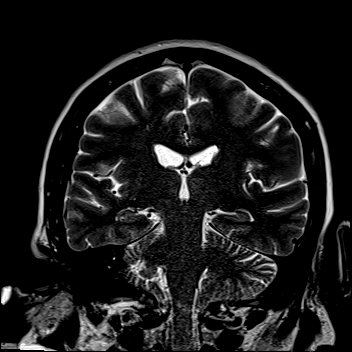

[Series 13: T2 fat-sat · coronal · 3.0mm · 0.54mm/px · 6 of 25 slices shown (5 of 6)]
[im 1/25]
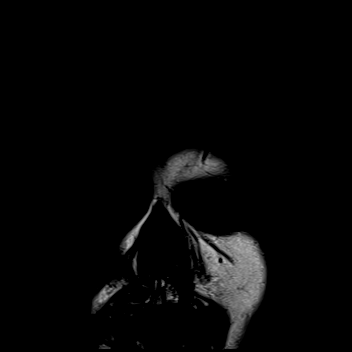
[im 5/25]
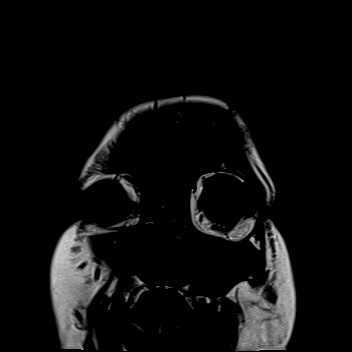
[im 10/25]
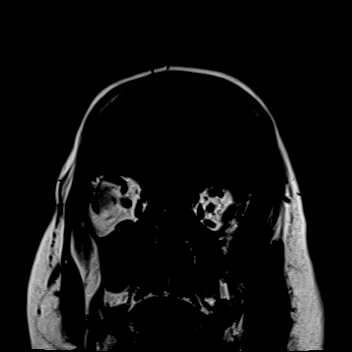
[im 15/25]
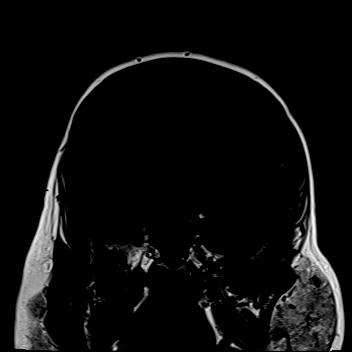
[im 20/25]
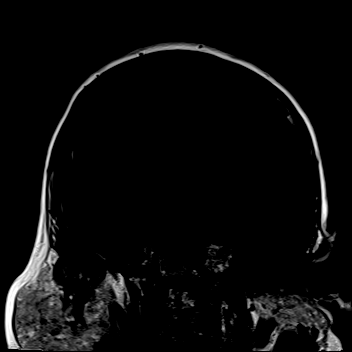
[im 25/25]
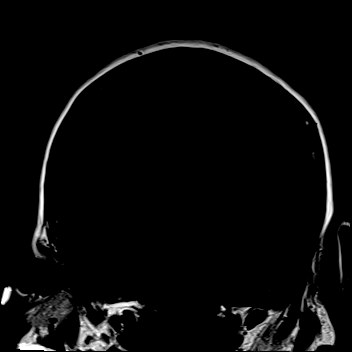

[Series 14: T2 fat-sat · coronal · 3.0mm · 0.54mm/px · 6 of 25 slices shown (6 of 6)]
[im 1/25]
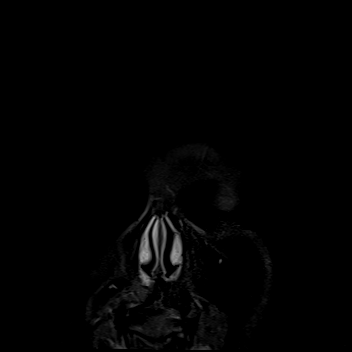
[im 5/25]
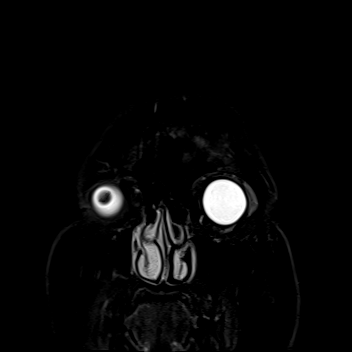
[im 10/25]
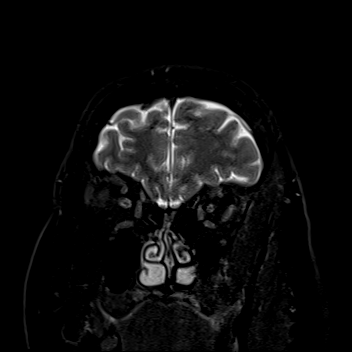
[im 15/25]
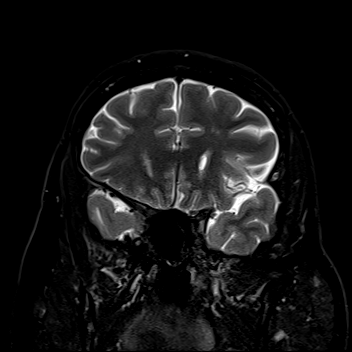
[im 20/25]
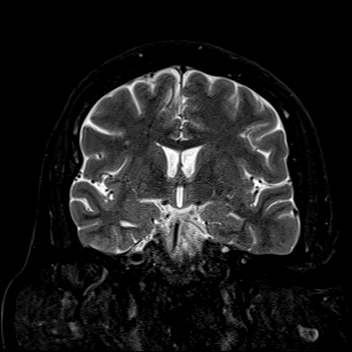
[im 25/25]
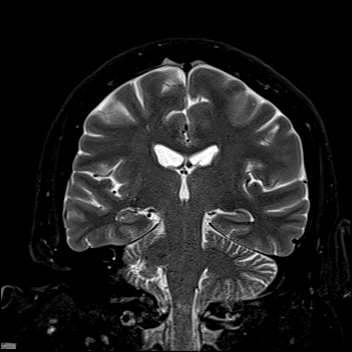

[Series 16: T1 · coronal · 3.0mm · 0.37mm/px · 1 of 25 slices shown (2 of 2)]
[im 1/25]
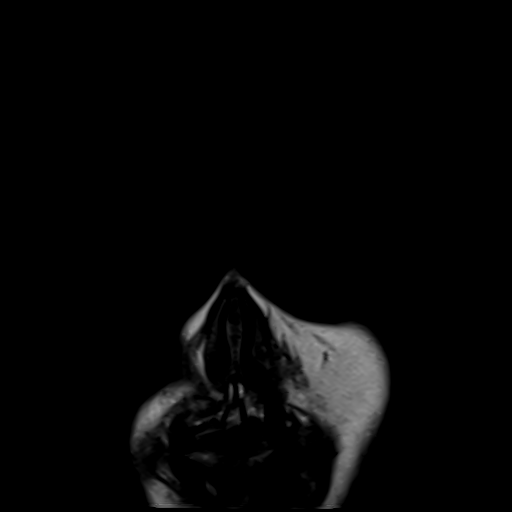

[34 of 48 positions shown; findings below may reference images not displayed]

FINDINGS: Orbits: Limited by motion artifact. No evidence of orbital
inflammation or mass. No evidence of optic neuritis. The orbital
fat, lacrimal glands, extraocular muscles, and globes are
unremarkable.

Visualized sinuses: Negative

Soft tissues: Negative

Limited intracranial: Reported separately
IMPRESSION: Negative motion degraded MRI of the orbits.

## 2021-12-21 IMAGING — CT CT ANGIO HEAD-NECK (W OR W/O PERF)
1 of 11 series · 5 of 33 positions shown · non-contrast
Comparison: Brain MRI from earlier today

CLINICAL DATA: Stroke follow-up

EXAM:
CT ANGIOGRAPHY HEAD AND NECK
TECHNIQUE: Multidetector CT imaging of the head and neck was performed using
the standard protocol during bolus administration of intravenous
contrast. Multiplanar CT image reconstructions and MIPs were
obtained to evaluate the vascular anatomy. Carotid stenosis
measurements (when applicable) are obtained utilizing NASCET
criteria, using the distal internal carotid diameter as the
denominator.

[Series 11: cta neck axial · axial · 0.39mm/px · z∈[-274,-70]mm · 5 of 306 slices shown]
[im 51/306  soft-tissue]
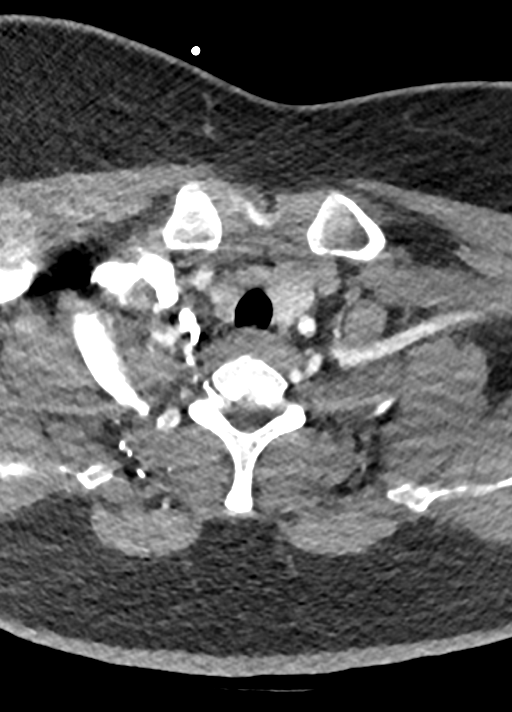
[im 102/306  bone]
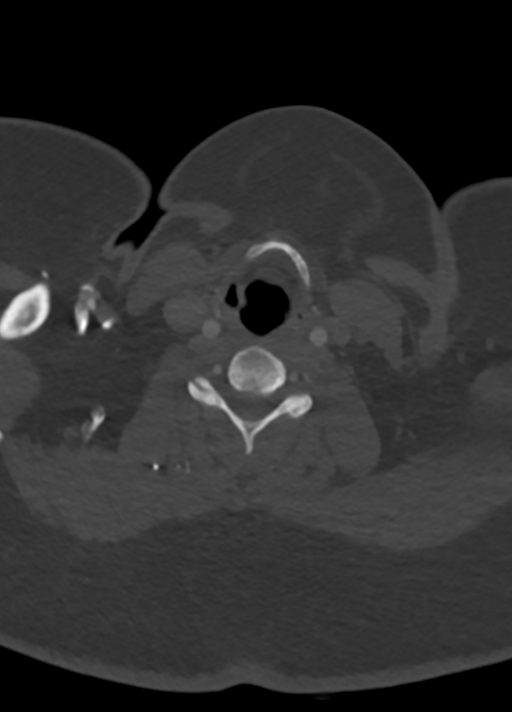
[im 153/306  soft-tissue]
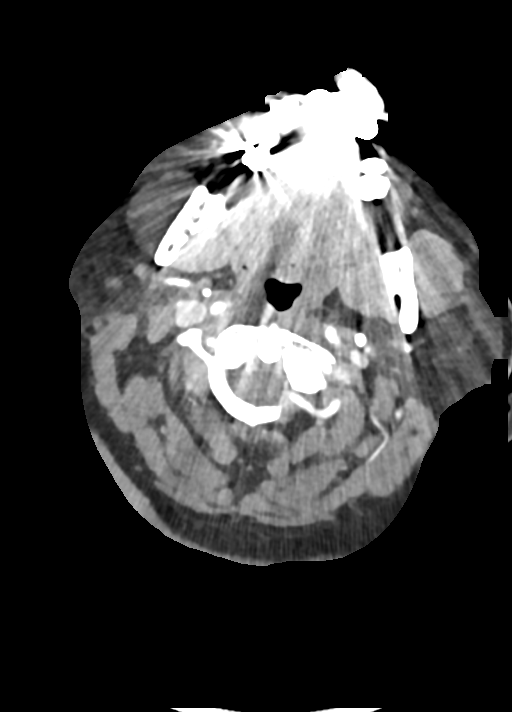
[im 204/306  bone]
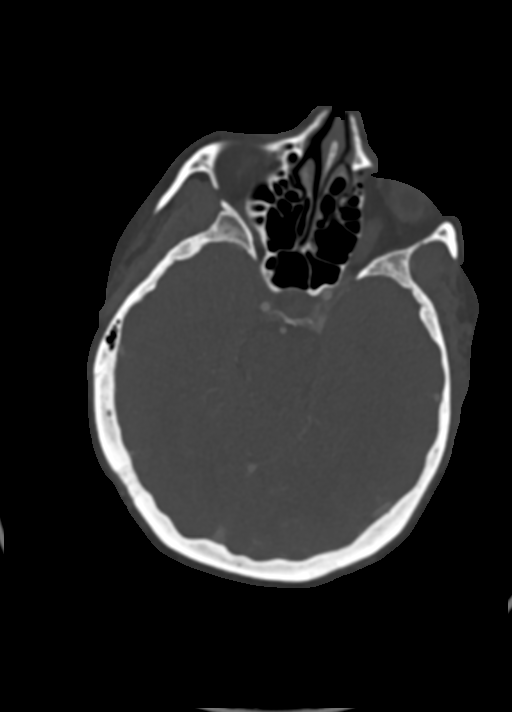
[im 255/306  soft-tissue]
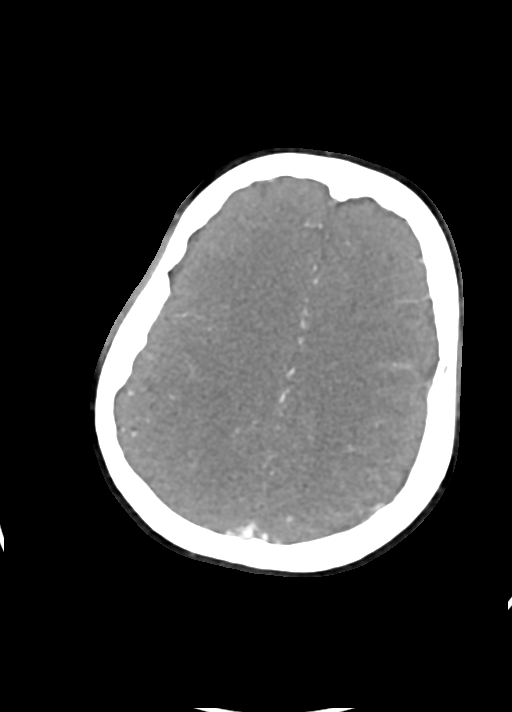

[5 of 33 positions shown; findings below may reference images not displayed]

RADIATION DOSE REDUCTION: This exam was performed according to the
departmental dose-optimization program which includes automated
exposure control, adjustment of the mA and/or kV according to
patient size and/or use of iterative reconstruction technique.

CONTRAST:  100mL OMNIPAQUE IOHEXOL 350 MG/ML SOLN
FINDINGS: CT HEAD FINDINGS

Brain: Acute ischemia is occult by CT. Remote perforator infarct at
the right basal ganglia. Coarse calcification at the left putamen
and left occipital cortex. No hemorrhage, hydrocephalus, or
collection

Vascular: See below

Skull: Negative

Sinuses: Negative

Orbits: Negative

Review of the MIP images confirms the above findings

CTA NECK FINDINGS

Aortic arch: Unremarkable

Right carotid system: Mild plaque at the bifurcation. No stenosis or
ulceration.

Left carotid system: Mild atheromatous plaque at the bifurcation. No
stenosis or ulceration

Vertebral arteries: Proximal subclavian and vertebral arteries are
smoothly contoured and widely patent.

Skeleton: Negative

Other neck: No evidence of mass or inflammation.

Upper chest: Mosaic attenuation of the lungs usually from small
airways disease

Review of the MIP images confirms the above findings

CTA HEAD FINDINGS

Anterior circulation: Mild atheromatous calcification of the carotid
siphons. No branch occlusion, beading, or flow limiting stenosis.

Posterior circulation: The vertebral and basilar arteries are
smoothly contoured and widely patent. No branch occlusion, beading,
or flow limiting stenosis.

Venous sinuses: Diffusely patent

Anatomic variants: None significant

Review of the MIP images confirms the above findings
IMPRESSION: 1. No emergent vascular finding.
2. Atherosclerosis without flow limiting stenosis of major vessels.
3. Left basal ganglia and left occipital cortex calcifications, are
there risk factors for remote neurocysticercosis?

## 2021-12-21 IMAGING — MR MR CERVICAL SPINE WO/W CM
6 of 8 series · 30 of 48 positions shown · IV contrast (Gadavist)
Comparison: Head CT from yesterday

CLINICAL DATA: Double vision with headache and vision changes.
Assess for multiple sclerosis/optic neuritis

EXAM:
MRI HEAD WITHOUT AND WITH CONTRAST
MRI CERVICAL SPINE WITHOUT AND WITH CONTRAST
TECHNIQUE: Multiplanar, multiecho pulse sequences of the brain and surrounding
structures, and cervical spine, to include the craniocervical
junction and cervicothoracic junction, were obtained without and
with intravenous contrast.
CONTRAST:  10mL GADAVIST GADOBUTROL 1 MMOL/ML IV SOLN

[Series 9: T1 · sagittal · 3.0mm · 0.69mm/px · 3 of 15 slices shown (1 of 2)]
[im 1/15]
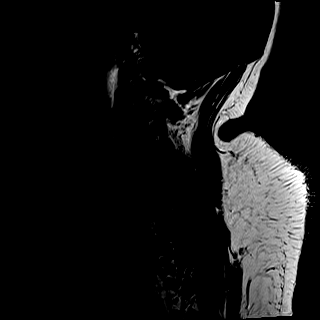
[im 8/15]
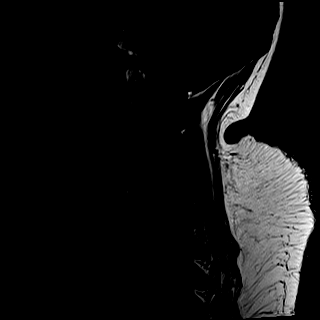
[im 15/15]
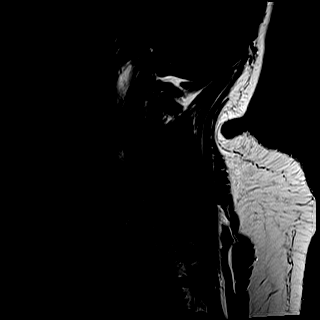

[Series 10: STIR · sagittal · 3.0mm · 0.86mm/px · 3 of 15 slices shown]
[im 1/15]
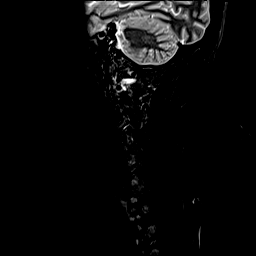
[im 8/15]
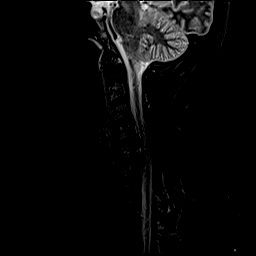
[im 15/15]
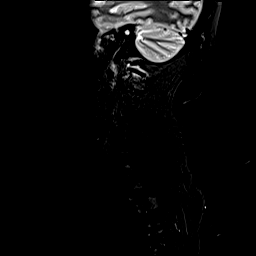

[Series 11: T2 · axial · 3.0mm · 0.66mm/px · z∈[-200,-74]mm · 9 of 40 slices shown (1 of 2)]
[im 1/40]
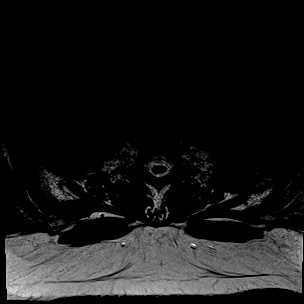
[im 5/40]
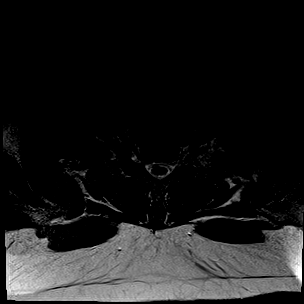
[im 10/40]
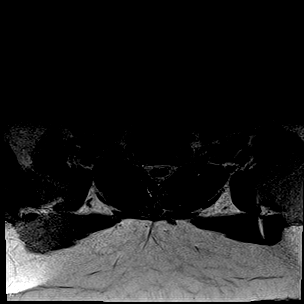
[im 15/40]
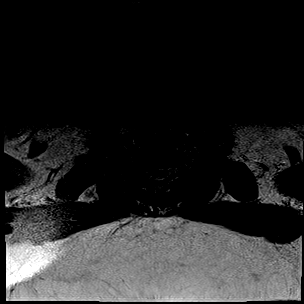
[im 20/40]
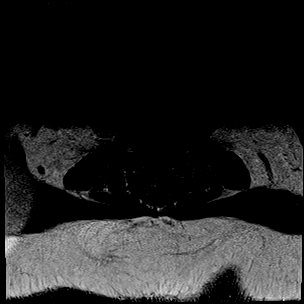
[im 25/40]
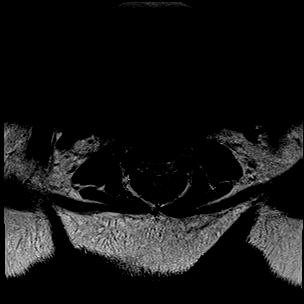
[im 30/40]
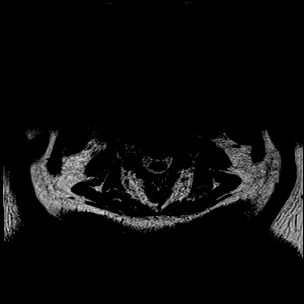
[im 35/40]
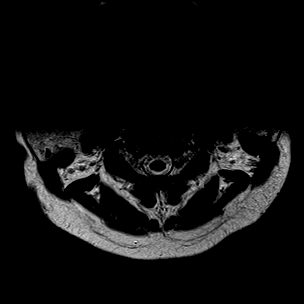
[im 40/40]
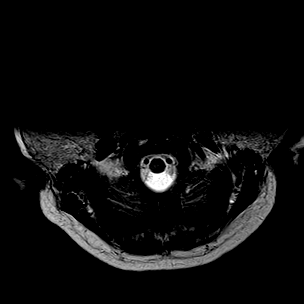

[Series 13: T1 · axial · 3.0mm · 0.39mm/px · z∈[-200,-74]mm · 9 of 40 slices shown (2 of 2)]
[im 1/40]
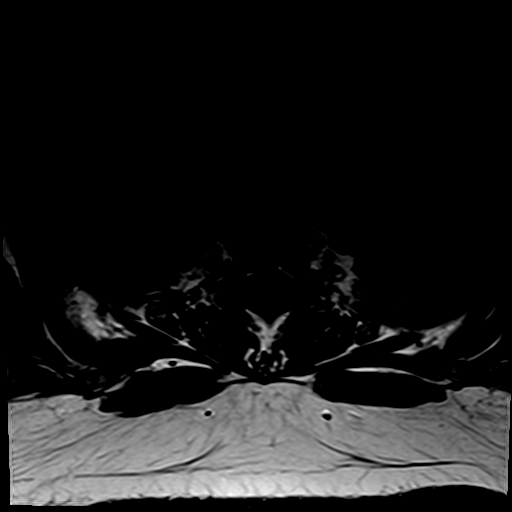
[im 5/40]
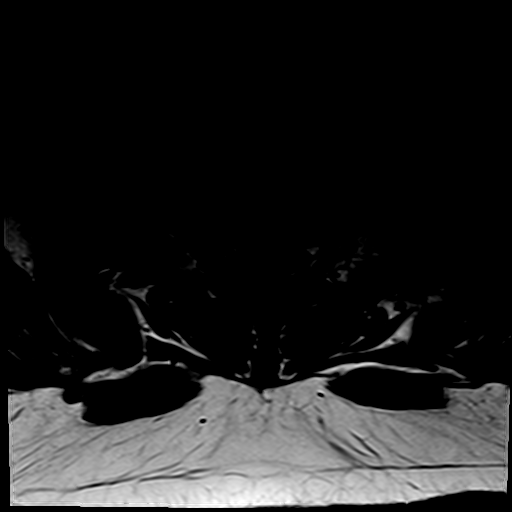
[im 10/40]
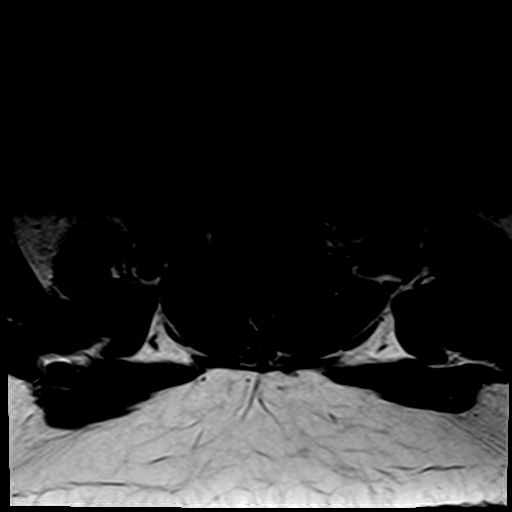
[im 15/40]
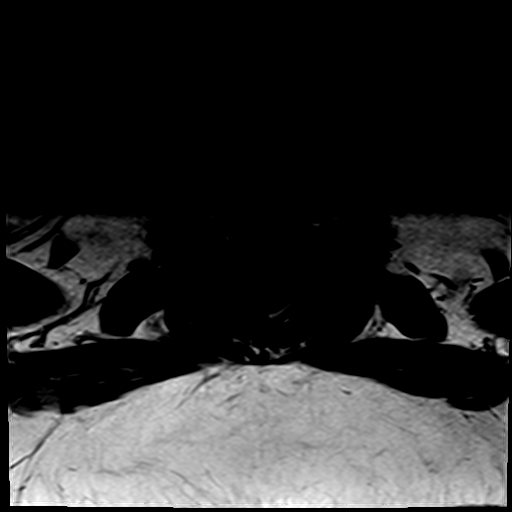
[im 20/40]
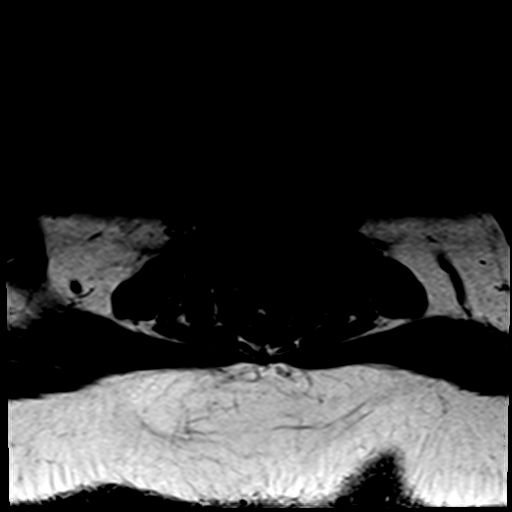
[im 25/40]
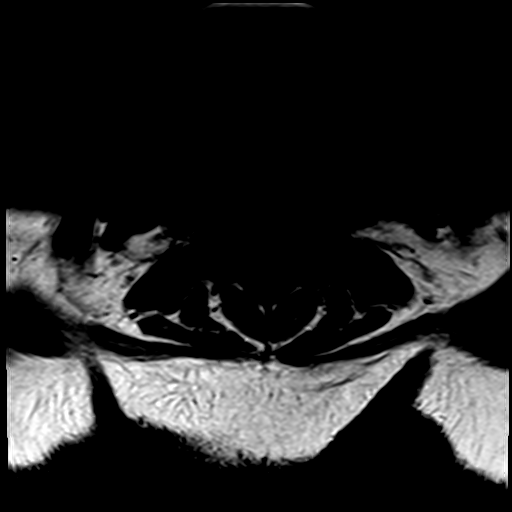
[im 30/40]
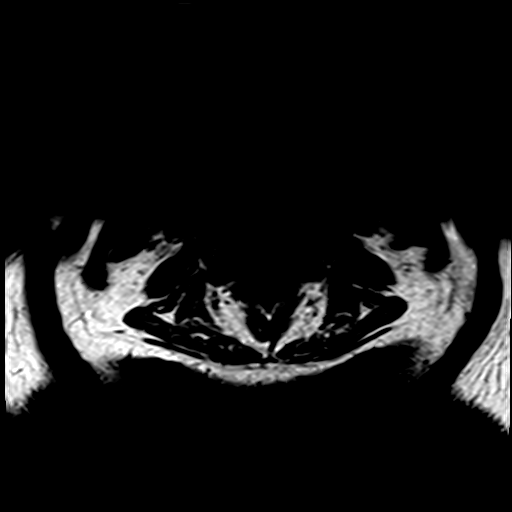
[im 35/40]
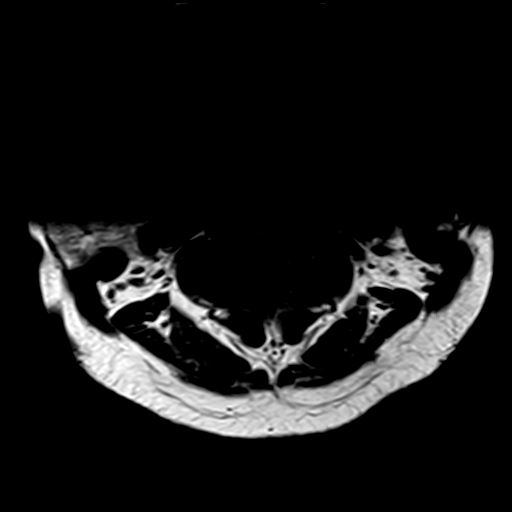
[im 40/40]
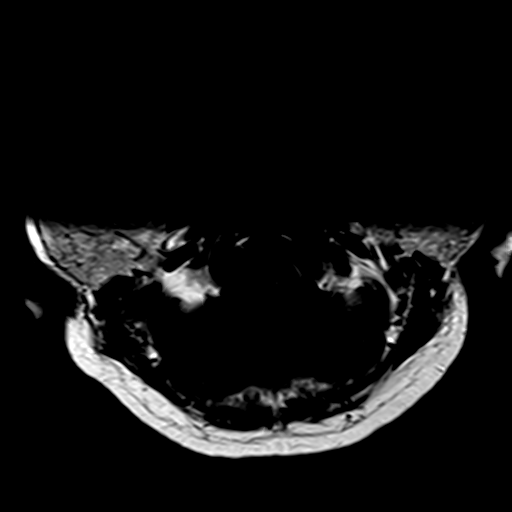

[Series 14: T2 · sagittal · 3.0mm · 0.69mm/px · 3 of 15 slices shown (2 of 2)]
[im 1/15]
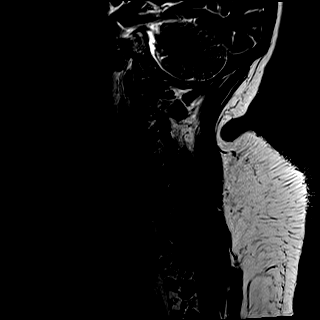
[im 8/15]
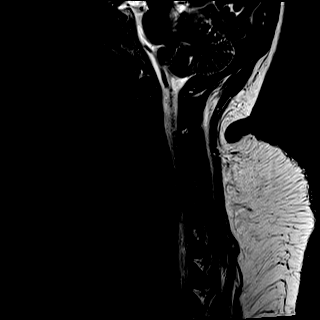
[im 15/15]
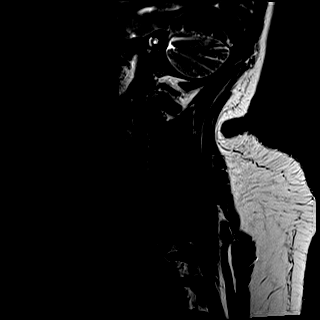

[Series 15: T1 fat-sat post-contrast · sagittal · 3.0mm · 0.43mm/px · 3 of 15 slices shown]
[im 1/15]
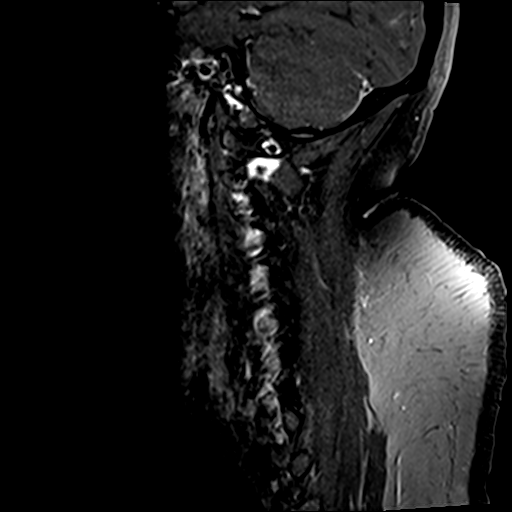
[im 8/15]
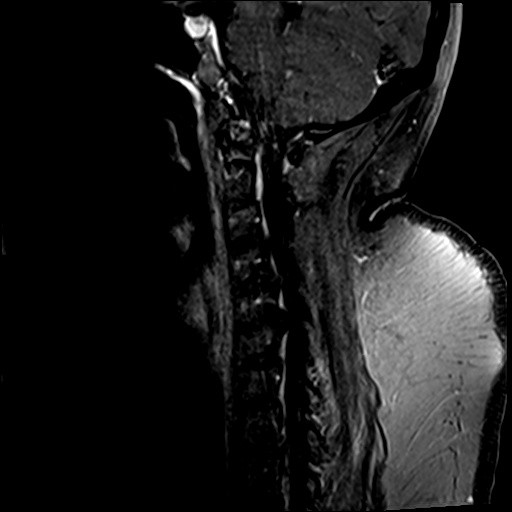
[im 15/15]
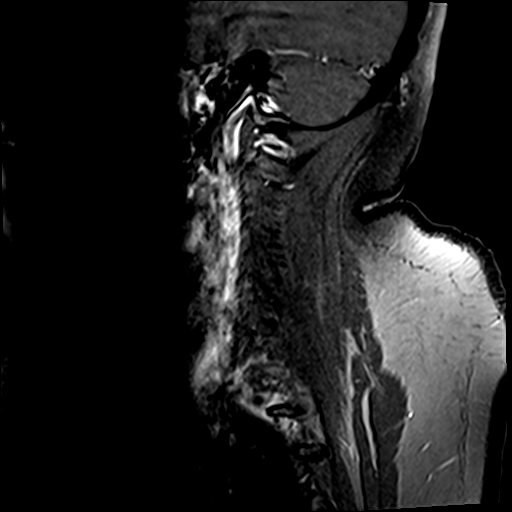

[30 of 48 positions shown; findings below may reference images not displayed]

FINDINGS: MRI HEAD FINDINGS

Brain: Patchy restricted diffusion along the cortex of the left
occipital lobe and parietal lobe with milder patchy involvement of
the high left frontal lobe. There is associated accentuated
enhancement along the surface of the brain, likely luxury perfusion
or pial collaterals. Wedge of T2 hyperintensity with volume loss at
the right corona radiata. No specific demyelinating pattern. No
mass, hydrocephalus, or collection.

Vascular: Major flow voids and vascular enhancements are preserved.

Skull and upper cervical spine: Normal marrow signal

Sinuses/Orbits: Negative

MRI CERVICAL SPINE FINDINGS

Alignment: Reversal of cervical lordosis which is likely positional

Vertebrae: No fracture, evidence of discitis, or bone lesion.

Cord: Normal signal and morphology.

Posterior Fossa, vertebral arteries, paraspinal tissues: Negative
for perispinal mass or inflammation

Disc levels:

C4-5: Disc bulging with left paracentral to foraminal protrusion.
Mild spinal stenosis. Patent foramina

C5-6: Sizable right paracentral to foraminal protrusion impinging on
the right ventral cord and causing prominent right foraminal
impingement. The herniation is likely chronic given evidence of
buttressing osteophyte.

Motion artifact
IMPRESSION: Brain MRI:

1. Patchy acute cortical infarcts in the left occipital lobe with
extension along the posterior left watershed.
2. Remote perforator infarct the right corona radiata.

Cervical spine:

1. Normal motion degraded appearance of the cord.
2. C5-6 large right paracentral to foraminal protrusion encroaching
on the right cord and causing right foraminal impingement.

## 2021-12-21 IMAGING — MR MR HEAD WO/W CM
13 of 15 series · 40 of 48 positions shown · IV contrast (gadavist)
Comparison: Head CT from yesterday

CLINICAL DATA: Double vision with headache and vision changes.
Assess for multiple sclerosis/optic neuritis

EXAM:
MRI HEAD WITHOUT AND WITH CONTRAST
MRI CERVICAL SPINE WITHOUT AND WITH CONTRAST
TECHNIQUE: Multiplanar, multiecho pulse sequences of the brain and surrounding
structures, and cervical spine, to include the craniocervical
junction and cervicothoracic junction, were obtained without and
with intravenous contrast.
CONTRAST:  10mL GADAVIST GADOBUTROL 1 MMOL/ML IV SOLN

[Series 5: DWI · axial · 3.0mm · 0.88mm/px · z∈[-48,+93]mm · 6 of 96 slices shown (1 of 4)]
[im 1/96]
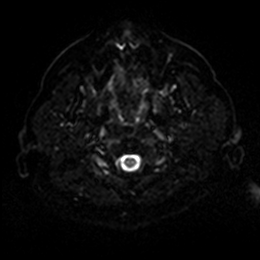
[im 20/96]
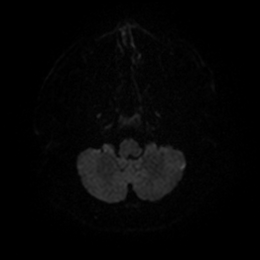
[im 39/96]
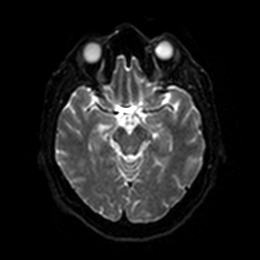
[im 58/96]
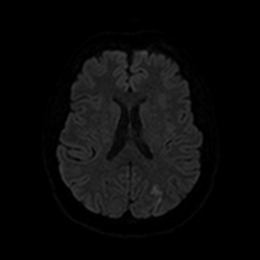
[im 77/96]
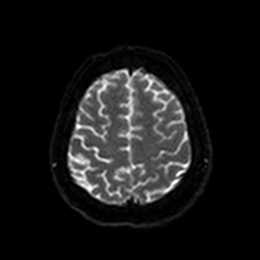
[im 96/96]
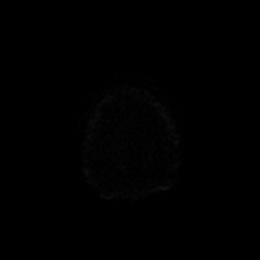

[Series 6: DWI · axial · 3.0mm · 0.88mm/px · z∈[-48,+93]mm · 3 of 48 slices shown (2 of 4)]
[im 1/48]
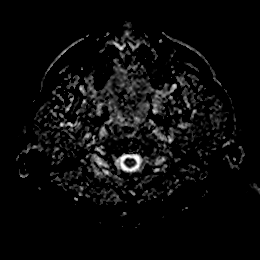
[im 24/48]
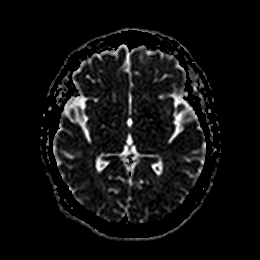
[im 48/48]
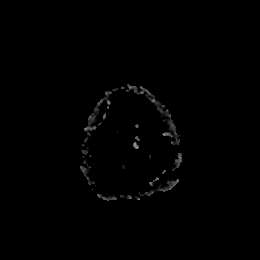

[Series 7: DWI · coronal · 4.0mm · 0.88mm/px · 4 of 68 slices shown (3 of 4)]
[im 1/68]
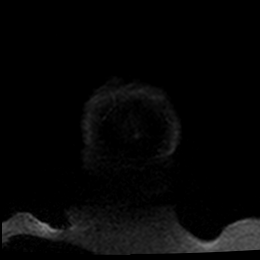
[im 23/68]
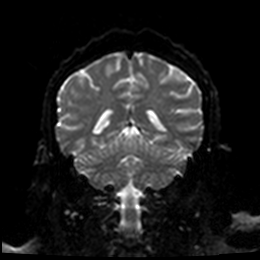
[im 45/68]
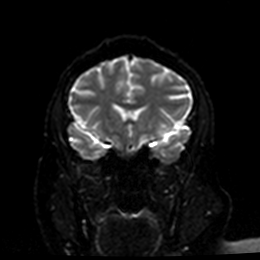
[im 68/68]
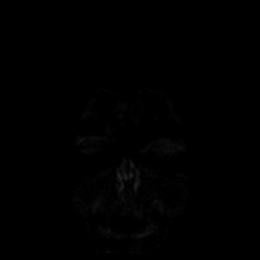

[Series 8: DWI · coronal · 4.0mm · 0.88mm/px · 2 of 34 slices shown (4 of 4)]
[im 1/34]
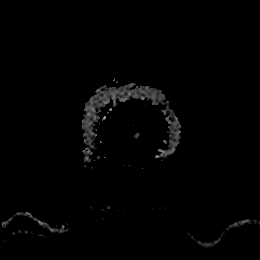
[im 34/34]
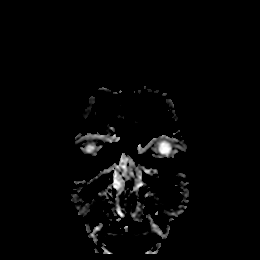

[Series 9: T1 · sagittal · 5.0mm · 0.75mm/px · 2 of 25 slices shown]
[im 1/25]
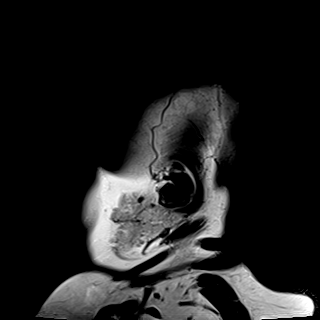
[im 25/25]
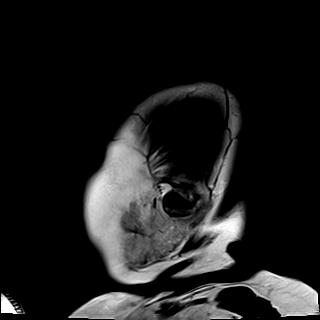

[Series 10: T2 · axial · 5.0mm · 0.72mm/px · z∈[-46,+98]mm · 2 of 25 slices shown]
[im 1/25]
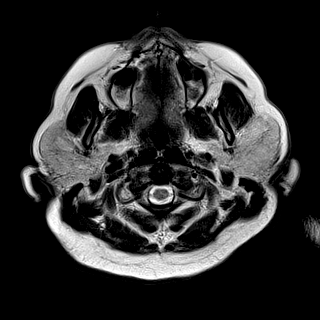
[im 25/25]
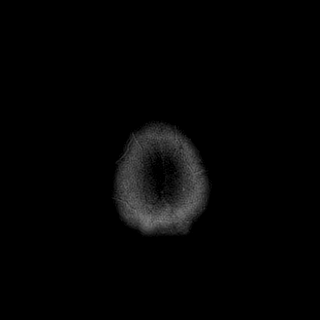

[Series 11: FLAIR · axial · 5.0mm · 0.45mm/px · z∈[-47,+96]mm · 2 of 25 slices shown]
[im 1/25]
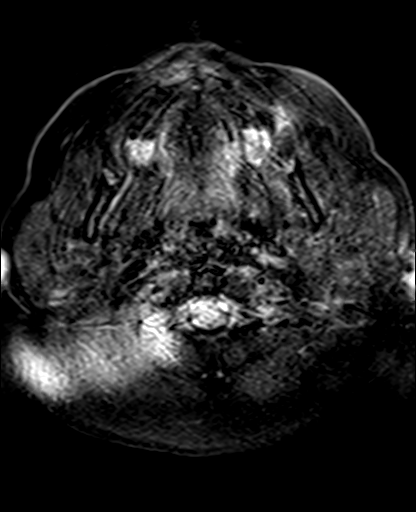
[im 25/25]
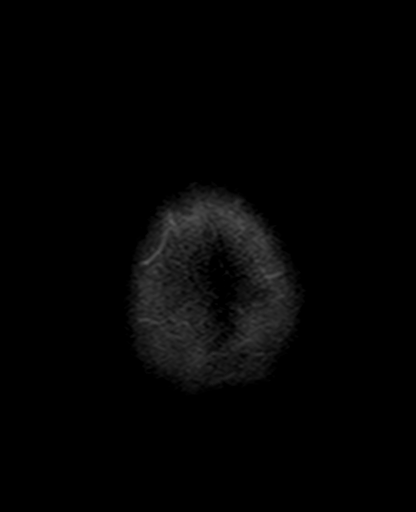

[Series 12: mag_images · axial · 3.0mm · 0.90mm/px · z∈[-64,+112]mm · 4 of 60 slices shown]
[im 1/60]
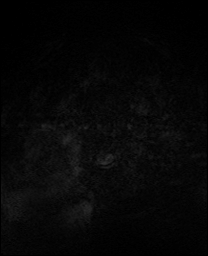
[im 20/60]
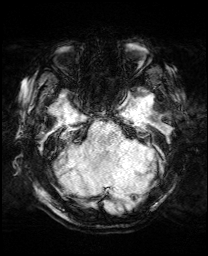
[im 40/60]
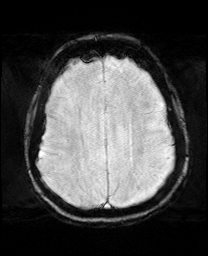
[im 60/60]
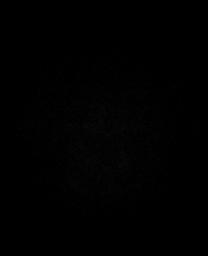

[Series 13: pha_images · axial · 3.0mm · 0.90mm/px · z∈[-64,+103]mm · 4 of 57 slices shown]
[im 1/57]
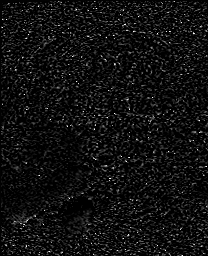
[im 19/57]
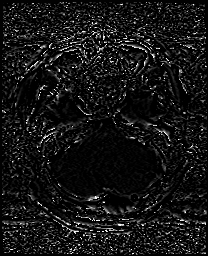
[im 38/57]
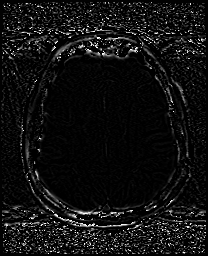
[im 57/57]
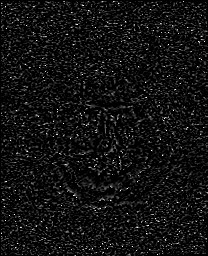

[Series 14: swi_images · axial · 3.0mm · 0.90mm/px · z∈[-64,+112]mm · 4 of 60 slices shown]
[im 1/60]
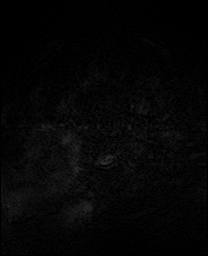
[im 20/60]
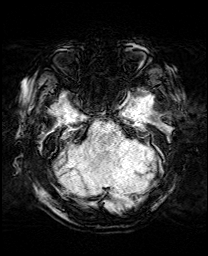
[im 40/60]
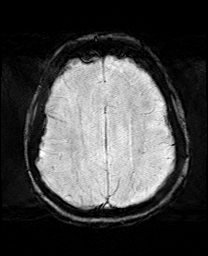
[im 60/60]
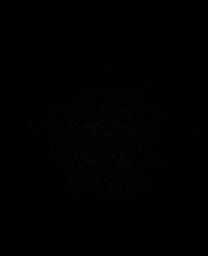

[Series 15: mip_images(sw) · axial · 24.0mm · 0.90mm/px · z∈[-53,+102]mm · 3 of 53 slices shown]
[im 1/53]
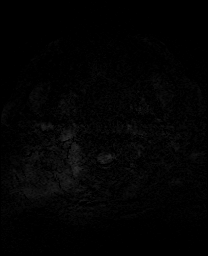
[im 27/53]
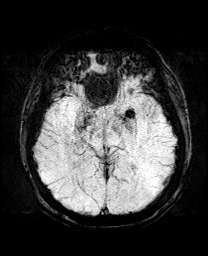
[im 53/53]
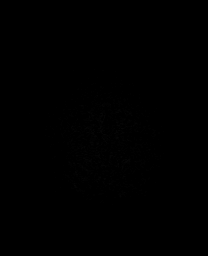

[Series 17: T2 post-contrast · coronal · 5.0mm · 0.72mm/px · 2 of 28 slices shown]
[im 1/28]
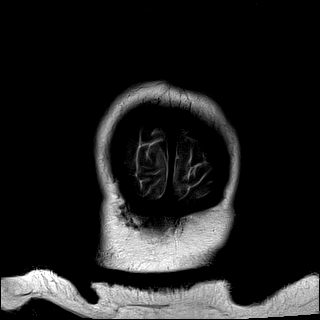
[im 28/28]
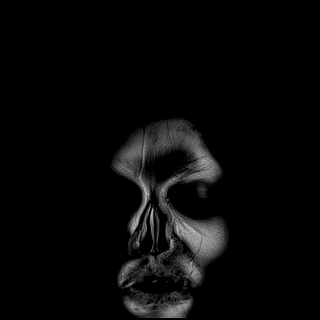

[Series 23: T1 post-contrast · coronal · 5.0mm · 0.34mm/px · 2 of 28 slices shown]
[im 1/28]
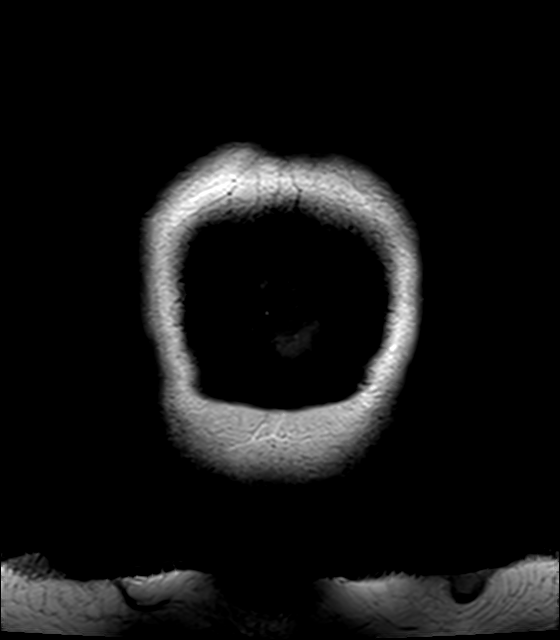
[im 28/28]
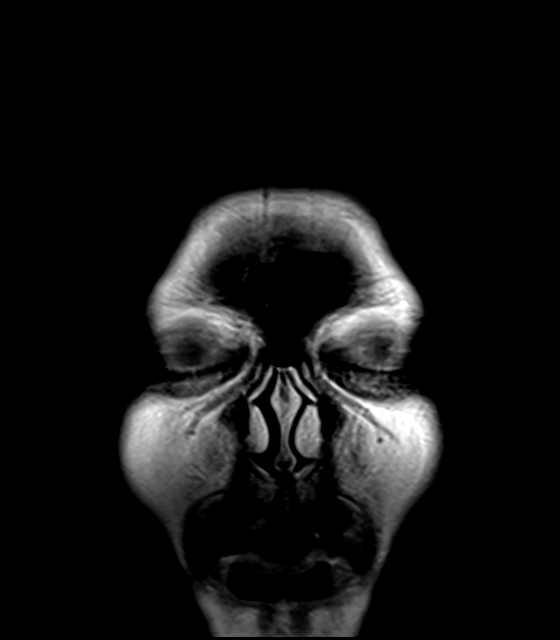

[40 of 48 positions shown; findings below may reference images not displayed]

FINDINGS: MRI HEAD FINDINGS

Brain: Patchy restricted diffusion along the cortex of the left
occipital lobe and parietal lobe with milder patchy involvement of
the high left frontal lobe. There is associated accentuated
enhancement along the surface of the brain, likely luxury perfusion
or pial collaterals. Wedge of T2 hyperintensity with volume loss at
the right corona radiata. No specific demyelinating pattern. No
mass, hydrocephalus, or collection.

Vascular: Major flow voids and vascular enhancements are preserved.

Skull and upper cervical spine: Normal marrow signal

Sinuses/Orbits: Negative

MRI CERVICAL SPINE FINDINGS

Alignment: Reversal of cervical lordosis which is likely positional

Vertebrae: No fracture, evidence of discitis, or bone lesion.

Cord: Normal signal and morphology.

Posterior Fossa, vertebral arteries, paraspinal tissues: Negative
for perispinal mass or inflammation

Disc levels:

C4-5: Disc bulging with left paracentral to foraminal protrusion.
Mild spinal stenosis. Patent foramina

C5-6: Sizable right paracentral to foraminal protrusion impinging on
the right ventral cord and causing prominent right foraminal
impingement. The herniation is likely chronic given evidence of
buttressing osteophyte.

Motion artifact
IMPRESSION: Brain MRI:

1. Patchy acute cortical infarcts in the left occipital lobe with
extension along the posterior left watershed.
2. Remote perforator infarct the right corona radiata.

Cervical spine:

1. Normal motion degraded appearance of the cord.
2. C5-6 large right paracentral to foraminal protrusion encroaching
on the right cord and causing right foraminal impingement.

## 2021-12-21 MED ORDER — PANTOPRAZOLE SODIUM 40 MG PO TBEC
40.0000 mg | DELAYED_RELEASE_TABLET | Freq: Every day | ORAL | Status: DC
  Administered 2021-12-21 – 2021-12-23 (×3): 40 mg via ORAL
  Filled 2021-12-21 (×4): qty 1

## 2021-12-21 MED ORDER — IOHEXOL 350 MG/ML SOLN
100.0000 mL | Freq: Once | INTRAVENOUS | Status: AC | PRN
  Administered 2021-12-21: 100 mL via INTRAVENOUS

## 2021-12-21 MED ORDER — KETOROLAC TROMETHAMINE 30 MG/ML IJ SOLN
30.0000 mg | Freq: Once | INTRAMUSCULAR | Status: AC
  Administered 2021-12-21: 30 mg via INTRAVENOUS
  Filled 2021-12-21: qty 1

## 2021-12-21 MED ORDER — INSULIN GLARGINE-YFGN 100 UNIT/ML ~~LOC~~ SOLN
20.0000 [IU] | Freq: Every day | SUBCUTANEOUS | Status: DC
  Administered 2021-12-21: 20 [IU] via SUBCUTANEOUS
  Filled 2021-12-21 (×2): qty 0.2

## 2021-12-21 MED ORDER — PROCHLORPERAZINE EDISYLATE 10 MG/2ML IJ SOLN
10.0000 mg | Freq: Once | INTRAMUSCULAR | Status: AC
  Administered 2021-12-21: 10 mg via INTRAVENOUS
  Filled 2021-12-21: qty 2

## 2021-12-21 MED ORDER — ACETAMINOPHEN 160 MG/5ML PO SOLN: 650.0000 mg | ORAL | Status: DC | PRN

## 2021-12-21 MED ORDER — SODIUM CHLORIDE 0.9 % IV SOLN: Freq: Once | INTRAVENOUS | Status: AC

## 2021-12-21 MED ORDER — GADOBUTROL 1 MMOL/ML IV SOLN
10.0000 mL | Freq: Once | INTRAVENOUS | Status: AC | PRN
  Administered 2021-12-21: 10 mL via INTRAVENOUS

## 2021-12-21 MED ORDER — SODIUM CHLORIDE 0.9 % IV BOLUS
1000.0000 mL | Freq: Once | INTRAVENOUS | Status: AC
  Administered 2021-12-21: 1000 mL via INTRAVENOUS

## 2021-12-21 MED ORDER — ACETAMINOPHEN 650 MG RE SUPP: 650.0000 mg | RECTAL | Status: DC | PRN

## 2021-12-21 MED ORDER — STROKE: EARLY STAGES OF RECOVERY BOOK
Freq: Once | Status: AC
  Filled 2021-12-21 (×2): qty 1

## 2021-12-21 MED ORDER — TETRACAINE HCL 0.5 % OP SOLN
2.0000 [drp] | Freq: Once | OPHTHALMIC | Status: AC
  Administered 2021-12-21: 2 [drp] via OPHTHALMIC
  Filled 2021-12-21: qty 4

## 2021-12-21 MED ORDER — GABAPENTIN 300 MG PO CAPS
300.0000 mg | ORAL_CAPSULE | Freq: Three times a day (TID) | ORAL | Status: DC
  Administered 2021-12-21 – 2021-12-23 (×9): 300 mg via ORAL
  Filled 2021-12-21 (×10): qty 1

## 2021-12-21 MED ORDER — ENOXAPARIN SODIUM 40 MG/0.4ML IJ SOSY
40.0000 mg | PREFILLED_SYRINGE | INTRAMUSCULAR | Status: DC
  Administered 2021-12-21 – 2021-12-23 (×3): 40 mg via SUBCUTANEOUS
  Filled 2021-12-21 (×4): qty 0.4

## 2021-12-21 MED ORDER — INSULIN ASPART 100 UNIT/ML IJ SOLN: 0.0000 [IU] | Freq: Every day | INTRAMUSCULAR | Status: DC

## 2021-12-21 MED ORDER — INSULIN ASPART 100 UNIT/ML IJ SOLN
12.0000 [IU] | Freq: Three times a day (TID) | INTRAMUSCULAR | Status: DC
  Administered 2021-12-21 – 2021-12-24 (×8): 12 [IU] via SUBCUTANEOUS

## 2021-12-21 MED ORDER — ATORVASTATIN CALCIUM 40 MG PO TABS
40.0000 mg | ORAL_TABLET | Freq: Every day | ORAL | Status: DC
Start: 2021-12-21 — End: 2021-12-21

## 2021-12-21 MED ORDER — DIPHENHYDRAMINE HCL 50 MG/ML IJ SOLN
12.5000 mg | Freq: Once | INTRAMUSCULAR | Status: AC
  Administered 2021-12-21: 12.5 mg via INTRAVENOUS
  Filled 2021-12-21: qty 1

## 2021-12-21 MED ORDER — HYDRALAZINE HCL 20 MG/ML IJ SOLN: 10.0000 mg | INTRAMUSCULAR | Status: DC | PRN

## 2021-12-21 MED ORDER — NICOTINE 21 MG/24HR TD PT24
21.0000 mg | MEDICATED_PATCH | Freq: Every day | TRANSDERMAL | Status: DC
  Administered 2021-12-21 – 2021-12-23 (×3): 21 mg via TRANSDERMAL
  Filled 2021-12-21 (×4): qty 1

## 2021-12-21 MED ORDER — ACETAMINOPHEN 325 MG PO TABS
650.0000 mg | ORAL_TABLET | ORAL | Status: DC | PRN
  Administered 2021-12-21 – 2021-12-22 (×2): 650 mg via ORAL
  Filled 2021-12-21 (×3): qty 2

## 2021-12-21 MED ORDER — FENOFIBRATE 160 MG PO TABS
160.0000 mg | ORAL_TABLET | Freq: Every day | ORAL | Status: DC
  Administered 2021-12-22 – 2021-12-23 (×2): 160 mg via ORAL
  Filled 2021-12-21 (×3): qty 1

## 2021-12-21 MED ORDER — SENNOSIDES-DOCUSATE SODIUM 8.6-50 MG PO TABS
1.0000 | ORAL_TABLET | Freq: Every evening | ORAL | Status: DC | PRN
  Administered 2021-12-21 – 2021-12-23 (×3): 1 via ORAL
  Filled 2021-12-21 (×3): qty 1

## 2021-12-21 MED ORDER — INSULIN ASPART 100 UNIT/ML IJ SOLN
0.0000 [IU] | Freq: Three times a day (TID) | INTRAMUSCULAR | Status: DC
  Administered 2021-12-21: 11 [IU] via SUBCUTANEOUS

## 2021-12-21 MED ORDER — INSULIN ASPART 100 UNIT/ML IJ SOLN
0.0000 [IU] | Freq: Three times a day (TID) | INTRAMUSCULAR | Status: DC
  Administered 2021-12-21: 5 [IU] via SUBCUTANEOUS
  Administered 2021-12-21: 2 [IU] via SUBCUTANEOUS
  Administered 2021-12-22 (×2): 5 [IU] via SUBCUTANEOUS

## 2021-12-21 MED ORDER — INSULIN LISPRO (1 UNIT DIAL) 100 UNIT/ML (KWIKPEN): 12.0000 [IU] | PEN_INJECTOR | Freq: Three times a day (TID) | SUBCUTANEOUS | Status: DC

## 2021-12-21 MED ORDER — LORAZEPAM 2 MG/ML IJ SOLN
1.0000 mg | Freq: Once | INTRAMUSCULAR | Status: AC
  Administered 2021-12-21: 1 mg via INTRAVENOUS
  Filled 2021-12-21: qty 1

## 2021-12-21 MED ORDER — CLOPIDOGREL BISULFATE 75 MG PO TABS
75.0000 mg | ORAL_TABLET | Freq: Every day | ORAL | Status: DC
Start: 2021-12-21 — End: 2021-12-24
  Administered 2021-12-21 – 2021-12-23 (×3): 75 mg via ORAL
  Filled 2021-12-21 (×4): qty 1

## 2021-12-21 MED ORDER — ASPIRIN 81 MG PO TBEC
81.0000 mg | DELAYED_RELEASE_TABLET | Freq: Every day | ORAL | Status: DC
  Administered 2021-12-21 – 2021-12-23 (×3): 81 mg via ORAL
  Filled 2021-12-21 (×4): qty 1

## 2021-12-21 NOTE — ED Notes (Signed)
Transport here to take pt to the floor.

## 2021-12-21 NOTE — Evaluation (Signed)
Physical Therapy Evaluation Patient Details Name: Julie Robertson MRN: [id number] DOB: 28-Sep-1975 Today's Date: 12/21/2021  History of Present Illness  Pt is a 46 y/o female admitted secondary to headache, wooziness and visual changes. 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 PMH includes DM and smoker.  Clinical Impression  Pt admitted secondary to problem above with deficits below. Notable visual deficits and pt reporting bright spots, especially in R eye. Decreased peripheral vision noted bilaterally. Requiring min to min guard A for mobility tasks within the room. Pt's son present and reports his step dad and sister live with pt and can provide assist at d/c. Recommending outpatient PT to address current mobility deficits. Will continue to follow acutely.        Recommendations for follow up therapy are one component of a multi-disciplinary discharge planning process, led by the attending physician.  Recommendations may be updated based on patient status, additional functional criteria and insurance authorization.  Follow Up Recommendations Outpatient PT    Assistance Recommended at Discharge Frequent or constant Supervision/Assistance  Patient can return home with the following  A little help with walking and/or transfers;A little help with bathing/dressing/bathroom;Assistance with cooking/housework;Assist for transportation;Help with stairs or ramp for entrance;Direct supervision/assist for medications management;Direct supervision/assist for financial management    Equipment Recommendations Other (comment) (TBD)  Recommendations for Other Services       Functional Status Assessment Patient has had a recent decline in their functional status and demonstrates the ability to make significant improvements in function in a reasonable and predictable amount of time.     Precautions / Restrictions Precautions Precautions: Fall Restrictions Weight  Bearing Restrictions: No      Mobility  Bed Mobility Overal bed mobility: Needs Assistance Bed Mobility: Supine to Sit, Sit to Supine     Supine to sit: Min assist Sit to supine: Supervision   General bed mobility comments: Min A for trunk elevation to come to sitting.    Transfers Overall transfer level: Needs assistance Equipment used: 1 person hand held assist Transfers: Sit to/from Stand Sit to Stand: Min assist           General transfer comment: Min A for steadying from higher stretcher height    Ambulation/Gait Ambulation/Gait assistance: Min guard Gait Distance (Feet): 5 Feet Assistive device: 1 person hand held assist Gait Pattern/deviations: Step-through pattern, Decreased stride length Gait velocity: Decreased     General Gait Details: ambulated short distance in ED room. Min guard for safety. Cues for obstacle navigation secondary to visual deficits. Pt's lunch arrived and wanting to eat so further mobility deferred.  Stairs            Wheelchair Mobility    Modified Rankin (Stroke Patients Only) Modified Rankin (Stroke Patients Only) Pre-Morbid Rankin Score: No symptoms Modified Rankin: Moderately severe disability     Balance Overall balance assessment: Needs assistance Sitting-balance support: No upper extremity supported, Feet supported Sitting balance-Leahy Scale: Fair     Standing balance support: Single extremity supported, No upper extremity supported Standing balance-Leahy Scale: Fair                               Pertinent Vitals/Pain Pain Assessment Pain Assessment: No/denies pain    Home Living Family/patient expects to be discharged to:: Private residence Living Arrangements: Spouse/significant other;Children Available Help at Discharge: Family Type of Home: House Home Access: Stairs to enter Entrance Stairs-Rails: None  Entrance Stairs-Number of Steps: 2   Home Layout: One level Home Equipment:  Conservation officer, nature (2 wheels)      Prior Function Prior Level of Function : Independent/Modified Independent;Driving                     Hand Dominance        Extremity/Trunk Assessment   Upper Extremity Assessment Upper Extremity Assessment: Defer to OT evaluation    Lower Extremity Assessment Lower Extremity Assessment: LLE deficits/detail LLE Deficits / Details: grossly 4/5 in knee extensors. Functional weakness noted as well.    Cervical / Trunk Assessment Cervical / Trunk Assessment: Normal  Communication   Communication: Prefers language other than [place] (son requesting to interpret; pt understands some english)  Cognition Arousal/Alertness: Awake/alert Behavior During Therapy: WFL for tasks assessed/performed Overall Cognitive Status: Difficult to assess                                 General Comments: seemed to follow all commands. Some slowed processing        General Comments General comments (skin integrity, edema, etc.): Pt's son present during session. During peripheral vision assessment, noted decreased peripheral vision to midline on bilateral eyes. Pt reporting bright spots in R eye and otherwise reporting blurry vision.    Exercises     Assessment/Plan    PT Assessment Patient needs continued PT services  PT Problem List Decreased strength;Decreased activity tolerance;Decreased balance;Decreased mobility;Decreased knowledge of use of DME;Decreased knowledge of precautions       PT Treatment Interventions DME instruction;Gait training;Stair training;Functional mobility training;Therapeutic activities;Therapeutic exercise;Balance training;Patient/family education    PT Goals (Current goals can be found in the Care Plan section)  Acute Rehab PT Goals Patient Stated Goal: to go home PT Goal Formulation: With patient/family Time For Goal Achievement: 01/04/22 Potential to Achieve Goals: Good    Frequency Min 4X/week      Co-evaluation               AM-PAC PT "6 Clicks" Mobility  Outcome Measure Help needed turning from your back to your side while in a flat bed without using bedrails?: A Little Help needed moving from lying on your back to sitting on the side of a flat bed without using bedrails?: A Little Help needed moving to and from a bed to a chair (including a wheelchair)?: A Little Help needed standing up from a chair using your arms (e.g., wheelchair or bedside chair)?: A Little Help needed to walk in hospital room?: A Little Help needed climbing 3-5 steps with a railing? : A Lot 6 Click Score: 17    End of Session   Activity Tolerance: Patient tolerated treatment well Patient left: in bed;with call bell/phone within reach;with family/visitor present (on stretcher in ED) Nurse Communication: Mobility status PT Visit Diagnosis: Unsteadiness on feet (R26.81);Muscle weakness (generalized) (M62.81)    Time: 1202-1218 PT Time Calculation (min) (ACUTE ONLY): 16 min   Charges:   PT Evaluation $PT Eval Moderate Complexity: 1 Mod          Reuel Derby, PT, DPT  Acute Rehabilitation Services  Office: [phone]   Rudean Hitt 12/21/2021, 1:04 PM

## 2021-12-21 NOTE — ED Provider Notes (Signed)
[facility] EMERGENCY DEPARTMENT Provider Note   CSN: [id number] Arrival date & time: 12/20/21  1549    History  Chief Complaint  Patient presents with   Blurred Vision    Headache    Julie Robertson is a 46 y.o. female with hx of DM, hyperlipidemia here for evaluation of HA, blurred vision. Began yesterday. Vision noted to be "double" bilaterally right greater than left. No floaters, trauma. Feels like her gait is off and she feels "drunk." No sudden onset thunderclap HA. Does not follow with Opthalmology or Neurology. No fever, neck pain, neck stiffness, CP, SOB, Abd pain, numbness. Feels generally weak. No hx of Ms, Optic Neuritis. No hx of prior CVA. Intermittently compliant with home meds. Has not used her insulin in the last few days.  No eye pain, redness, eye discharge, eye redness, photophobia, no visual field cuts.  HPI     Home Medications Prior to Admission medications   Medication Sig Start Date End Date Taking? Authorizing Provider  albuterol (VENTOLIN HFA) 108 (90 Base) MCG/ACT inhaler Inhale 2 puffs into the lungs every 6 (six) hours as needed for wheezing or shortness of breath. 12/04/18   Kerin Perna, NP  atorvastatin (LIPITOR) 40 MG tablet Take 1 tablet (40 mg total) by mouth at bedtime. 09/01/21 09/01/22  Kerin Perna, NP  Blood Glucose Monitoring Suppl (TRUE METRIX METER) w/Device KIT Use as directed 04/12/17   Alfonse Spruce, FNP  Blood Glucose Monitoring Suppl (TRUE METRIX METER) w/Device KIT use up to 4 times daily as directed 04/12/21   Kerin Perna, NP  fenofibrate (TRICOR) 145 MG tablet TAKE 1 TABLET (145 MG TOTAL) BY MOUTH DAILY. 09/06/21 09/06/22  Kerin Perna, NP  gabapentin (NEURONTIN) 300 MG capsule Take 1 capsule (300 mg total) by mouth 3 (three) times daily. 10/06/21 10/06/22  Kerin Perna, NP  glipiZIDE (GLUCOTROL) 10 MG tablet TAKE 1 TABLET (10 MG TOTAL) BY MOUTH 2 (TWO) TIMES DAILY BEFORE A  MEAL. 08/30/21 08/30/22  Kerin Perna, NP  glucose blood (TRUE METRIX BLOOD GLUCOSE TEST) test strip Use as instructed 03/22/18   Fulp, Cammie, MD  glucose blood test strip use up to 4 times daily as directed 04/12/21   Kerin Perna, NP  Insulin Glargine [facility]) 100 UNIT/ML INJECT 60 UNITS INTO THE SKIN AT BEDTIME. 08/30/21 08/30/22  Kerin Perna, NP  insulin lispro (HUMALOG) 100 UNIT/ML KwikPen INJECT 12 UNITS INTO THE SKIN 3 TIMES DAILY PER SLIDING SCALE 08/30/21 08/30/22  Kerin Perna, NP  Insulin Pen Needle 31G X 5 MM MISC Inject 10 units subcutaneous at bedtime 09/01/20   Kerin Perna, NP  linaclotide [facility]) 145 MCG CAPS capsule Take 1 capsule (145 mcg total) by mouth daily before breakfast. 08/30/21   Kerin Perna, NP  losartan (COZAAR) 25 MG tablet TAKE 1 TABLET (25 MG TOTAL) BY MOUTH DAILY. 08/30/21 08/30/22  Kerin Perna, NP  metFORMIN (GLUCOPHAGE) 1000 MG tablet TAKE 1 TABLET (1,000 MG TOTAL) BY MOUTH 2 (TWO) TIMES DAILY WITH A MEAL. 08/30/21 08/30/22  Kerin Perna, NP  naproxen (NAPROSYN) 375 MG tablet Take 1 tablet (375 mg total) by mouth 2 (two) times daily. 11/08/21   Dorie Rank, MD  omeprazole (PRILOSEC) 20 MG capsule TAKE 1 CAPSULE (20 MG TOTAL) BY MOUTH DAILY. 10/06/21   Kerin Perna, NP  sorbitol 70 % SOLN Take 30 mLs by mouth daily as needed for moderate constipation. 09/10/20  Kerin Perna, NP  TRUEplus Lancets 28G MISC use up to 4 times daily as directed 04/12/21   Kerin Perna, NP      Allergies    Patient has no known allergies.    Review of Systems   Review of Systems  Constitutional: Negative.   HENT: Negative.    Eyes:  Positive for visual disturbance. Negative for photophobia, pain, discharge, redness and itching.  Respiratory: Negative.    Musculoskeletal:  Positive for gait problem.  Neurological:  Positive for headaches.  All other systems reviewed and are negative.   Physical  Exam Updated Vital Signs BP 109/76 (BP Location: Right Arm)   Pulse 69   Temp 98.1 F (36.7 C) (Oral)   Resp (!) 24   SpO2 96%  Physical Exam Physical Exam  Constitutional: Pt is oriented to person, place, and time. Pt appears well-developed and well-nourished. No distress.  HENT:  Head: Normocephalic and atraumatic.  Mouth/Throat: Oropharynx is clear and moist.  Eyes: Conjunctivae and EOM are normal. Pupils are equal, round, and reactive to light. No scleral icterus. IOP WNL Bil No horizontal, vertical or rotational nystagmus  Neck: Normal range of motion. Neck supple.  Full active and passive ROM without pain No midline or paraspinal tenderness No nuchal rigidity or meningeal signs  Cardiovascular: Normal rate, regular rhythm and intact distal pulses.   Pulmonary/Chest: Effort normal and breath sounds normal. No respiratory distress. Pt has no wheezes. No rales.  Abdominal: Soft. Bowel sounds are normal. There is no tenderness. There is no rebound and no guarding.  Large old midline abdominal incision Musculoskeletal: Normal range of motion.  Lymphadenopathy:    No cervical adenopathy.  Neurological: Pt. is alert and oriented to person, place, and time. He has normal reflexes. No cranial nerve deficit.  Exhibits normal muscle tone. Coordination normal.  Mental Status:  Alert, oriented, thought content appropriate. Speech fluent without evidence of aphasia. Able to follow 2 step commands without difficulty.  Cranial Nerves:  II:  Peripheral visual fields grossly normal, pupils equal, round, reactive to light III,IV, VI: ptosis not present, extra-ocular motions intact bilaterally  V,VII: smile symmetric, facial light touch sensation equal VIII: hearing grossly normal bilaterally  IX,X: midline uvula rise  XI: bilateral shoulder shrug equal and strong XII: midline tongue extension  Motor:  5/5 in upper and lower extremities bilaterally including strong and equal grip strength and  dorsiflexion/plantar flexion Sensory: Pinprick and light touch normal in all extremities.  Deep Tendon Reflexes: 2+ and symmetric  Cerebellar: normal finger-to-nose with bilateral upper extremities Gait: Appear unsteady with gait however not ataxic CV: distal pulses palpable throughout   Skin: Skin is warm and dry. No rash noted. Pt is not diaphoretic.  Psychiatric: Pt has a normal mood and affect. Behavior is normal. Judgment and thought content normal.  Nursing note and vitals reviewed.  ED Results / Procedures / Treatments   Labs (all labs ordered are listed, but only abnormal results are displayed) Labs Reviewed  BASIC METABOLIC PANEL - Abnormal; Notable for the following components:      Result Value   CO2 20 (*)    Glucose, Bld 216 (*)    Calcium 8.8 (*)    All other components within normal limits  CBC WITH DIFFERENTIAL/PLATELET - Abnormal; Notable for the following components:   Hemoglobin 11.1 (*)    MCV 74.6 (*)    MCH 22.5 (*)    All other components within normal limits  CBG MONITORING, ED -  Abnormal; Notable for the following components:   Glucose-Capillary 199 (*)    All other components within normal limits  CBG MONITORING, ED - Abnormal; Notable for the following components:   Glucose-Capillary 230 (*)    All other components within normal limits  I-STAT BETA HCG BLOOD, ED ([facility], [facility], [facility] ONLY)    EKG None  Radiology MR Brain W and Wo Contrast  Result Date: 12/21/2021 CLINICAL DATA:  Double vision with headache and vision changes. Assess for multiple sclerosis/optic neuritis EXAM: MRI HEAD WITHOUT AND WITH CONTRAST MRI CERVICAL SPINE WITHOUT AND WITH CONTRAST TECHNIQUE: Multiplanar, multiecho pulse sequences of the brain and surrounding structures, and cervical spine, to include the craniocervical junction and cervicothoracic junction, were obtained without and with intravenous contrast. CONTRAST:  38m GADAVIST GADOBUTROL 1 MMOL/ML IV SOLN COMPARISON:  Head CT from  yesterday FINDINGS: MRI HEAD FINDINGS Brain: Patchy restricted diffusion along the cortex of the left occipital lobe and parietal lobe with milder patchy involvement of the high left frontal lobe. There is associated accentuated enhancement along the surface of the brain, likely luxury perfusion or pial collaterals. Wedge of T2 hyperintensity with volume loss at the right corona radiata. No specific demyelinating pattern. No mass, hydrocephalus, or collection. Vascular: Major flow voids and vascular enhancements are preserved. Skull and upper cervical spine: Normal marrow signal Sinuses/Orbits: Negative MRI CERVICAL SPINE FINDINGS Alignment: Reversal of cervical lordosis which is likely positional Vertebrae: No fracture, evidence of discitis, or bone lesion. Cord: Normal signal and morphology. Posterior Fossa, vertebral arteries, paraspinal tissues: Negative for perispinal mass or inflammation Disc levels: C4-5: Disc bulging with left paracentral to foraminal protrusion. Mild spinal stenosis. Patent foramina C5-6: Sizable right paracentral to foraminal protrusion impinging on the right ventral cord and causing prominent right foraminal impingement. The herniation is likely chronic given evidence of buttressing osteophyte. Motion artifact IMPRESSION: Brain MRI: 1. Patchy acute cortical infarcts in the left occipital lobe with extension along the posterior left watershed. 2. Remote perforator infarct the right corona radiata. Cervical spine: 1. Normal motion degraded appearance of the cord. 2. C5-6 large right paracentral to foraminal protrusion encroaching on the right cord and causing right foraminal impingement. Electronically Signed   By: JJorje GuildM.D.   On: 12/21/2021 05:55   MR Cervical Spine W or Wo Contrast  Result Date: 12/21/2021 CLINICAL DATA:  Double vision with headache and vision changes. Assess for multiple sclerosis/optic neuritis EXAM: MRI HEAD WITHOUT AND WITH CONTRAST MRI CERVICAL SPINE  WITHOUT AND WITH CONTRAST TECHNIQUE: Multiplanar, multiecho pulse sequences of the brain and surrounding structures, and cervical spine, to include the craniocervical junction and cervicothoracic junction, were obtained without and with intravenous contrast. CONTRAST:  147mGADAVIST GADOBUTROL 1 MMOL/ML IV SOLN COMPARISON:  Head CT from yesterday FINDINGS: MRI HEAD FINDINGS Brain: Patchy restricted diffusion along the cortex of the left occipital lobe and parietal lobe with milder patchy involvement of the high left frontal lobe. There is associated accentuated enhancement along the surface of the brain, likely luxury perfusion or pial collaterals. Wedge of T2 hyperintensity with volume loss at the right corona radiata. No specific demyelinating pattern. No mass, hydrocephalus, or collection. Vascular: Major flow voids and vascular enhancements are preserved. Skull and upper cervical spine: Normal marrow signal Sinuses/Orbits: Negative MRI CERVICAL SPINE FINDINGS Alignment: Reversal of cervical lordosis which is likely positional Vertebrae: No fracture, evidence of discitis, or bone lesion. Cord: Normal signal and morphology. Posterior Fossa, vertebral arteries, paraspinal tissues: Negative for perispinal mass or inflammation  Disc levels: C4-5: Disc bulging with left paracentral to foraminal protrusion. Mild spinal stenosis. Patent foramina C5-6: Sizable right paracentral to foraminal protrusion impinging on the right ventral cord and causing prominent right foraminal impingement. The herniation is likely chronic given evidence of buttressing osteophyte. Motion artifact IMPRESSION: Brain MRI: 1. Patchy acute cortical infarcts in the left occipital lobe with extension along the posterior left watershed. 2. Remote perforator infarct the right corona radiata. Cervical spine: 1. Normal motion degraded appearance of the cord. 2. C5-6 large right paracentral to foraminal protrusion encroaching on the right cord and  causing right foraminal impingement. Electronically Signed   By: Jorje Guild M.D.   On: 12/21/2021 05:55   MR ORBITS W WO CONTRAST  Result Date: 12/21/2021 CLINICAL DATA:  Blurry vision and headache EXAM: MRI OF THE ORBITS WITHOUT AND WITH CONTRAST TECHNIQUE: Multiplanar, multi-echo pulse sequences of the orbits and surrounding structures were acquired including fat saturation techniques, before and after intravenous contrast administration. CONTRAST:  83m GADAVIST GADOBUTROL 1 MMOL/ML IV SOLN COMPARISON:  Head CT from yesterday FINDINGS: Orbits: Limited by motion artifact. No evidence of orbital inflammation or mass. No evidence of optic neuritis. The orbital fat, lacrimal glands, extraocular muscles, and globes are unremarkable. Visualized sinuses: Negative Soft tissues: Negative Limited intracranial: Reported separately IMPRESSION: Negative motion degraded MRI of the orbits. Electronically Signed   By: JJorje GuildM.D.   On: 12/21/2021 05:47   CT HEAD WO CONTRAST (5MM)  Result Date: 12/20/2021 CLINICAL DATA:  Diplopia EXAM: CT HEAD WITHOUT CONTRAST TECHNIQUE: Contiguous axial images were obtained from the base of the skull through the vertex without intravenous contrast. RADIATION DOSE REDUCTION: This exam was performed according to the departmental dose-optimization program which includes automated exposure control, adjustment of the mA and/or kV according to patient size and/or use of iterative reconstruction technique. COMPARISON:  CT head 11/08/2021 FINDINGS: Brain: No acute intracranial hemorrhage, mass effect, or herniation. No extra-axial fluid collections. No evidence of acute territorial infarct. No hydrocephalus. Stable small patchy hypodensities in the right basal ganglia and corona radiata. Chronic punctate calcification in the left basal ganglia. Vascular: No hyperdense vessel or unexpected calcification. Skull: Normal. Negative for fracture or focal lesion. Sinuses/Orbits: No acute  finding. Other: None. IMPRESSION: Chronic changes with no acute intracranial process identified. Electronically Signed   By: DOfilia NeasM.D.   On: 12/20/2021 17:49    Procedures .Critical Care  Performed by: HNettie Elm PA-C Authorized by: HNettie Elm PA-C   Critical care provider statement:    Critical care time (minutes):  35   Critical care was necessary to treat or prevent imminent or life-threatening deterioration of the following conditions:  CNS failure or compromise   Critical care was time spent personally by me on the following activities:  Development of treatment plan with patient or surrogate, discussions with consultants, evaluation of patient's response to treatment, examination of patient, ordering and review of laboratory studies, ordering and review of radiographic studies, ordering and performing treatments and interventions, pulse oximetry, re-evaluation of patient's condition and review of old charts     Medications Ordered in ED Medications  prochlorperazine (COMPAZINE) injection 10 mg (10 mg Intravenous Given 12/21/21 0231)  diphenhydrAMINE (BENADRYL) injection 12.5 mg (12.5 mg Intravenous Given 12/21/21 0230)  ketorolac (TORADOL) 30 MG/ML injection 30 mg (30 mg Intravenous Given 12/21/21 0226)  sodium chloride 0.9 % bolus 1,000 mL (1,000 mLs Intravenous New Bag/Given 12/21/21 0236)  LORazepam (ATIVAN) injection 1 mg (1 mg Intravenous  Given 12/21/21 0325)  tetracaine (PONTOCAINE) 0.5 % ophthalmic solution 2 drop (2 drops Both Eyes Given 12/21/21 0229)  gadobutrol (GADAVIST) 1 MMOL/ML injection 10 mL (10 mLs Intravenous Contrast Given 12/21/21 0451)   ED Course/ Medical Decision Making/ A&P    46 year old here for evaluation of DM, hyperlipidemia here for evaluation of HA, blurred vision and sensation of "feeling drunk" with ambulation. Patient without a non focal neuro exam without deficits however given hx will get labs, imaging and migraine  cocktail. Of note has been out of home DM mediations. While she does have blurred vision no visual field cuts, flashers/ floaters, eye pain/ trauma.   Labs and imaging personally viewed and interpreted:  CBC without leukocytosis, Hgb 11.1 BMP glucose 216, has hx of DM Ct head with chronic findings MR brain with acute CVA occipital into left watershed MR orbits without significant abnormality MR cervical without significant abnormality  Patient reassessed.  Headache improved with migraine cocktail.  She is pending her MRI says she still feels "wobbly" when she walks  IOP WNL  Patient reassessed.  Her MRI does show acute CVA which is consistent with her symptoms. Will discuss with neurology  Discussed results with patient via [place] interpreter.  She is agreeable for admission.  Her headache has resolved.  She is sleepy from the Ativan given for the MRI however.  CONSULT  with neurology, Dr.Khaliqdina who recommends medicine admission neurology will follow along for Stroke [facility]  CONSULT with Dr.Opyd with [facility] is agreeable to evaluate patient for admission  The patient appears reasonably stabilized for admission considering the current resources, flow, and capabilities available in the ED at this time, and I doubt any other [facility] requiring further screening and/or treatment in the ED prior to admission.                             Medical Decision Making Amount and/or Complexity of Data Reviewed Independent Historian: friend External Data Reviewed: labs, radiology and notes. Labs: ordered. Decision-making details documented in ED Course. Radiology: ordered and independent interpretation performed. Decision-making details documented in ED Course.  Risk OTC drugs. Prescription drug management. Parenteral controlled substances. Decision regarding hospitalization. Diagnosis or treatment significantly limited by social determinants of health.          Final Clinical  Impression(s) / ED Diagnoses Final diagnoses:  Cerebrovascular accident (CVA), unspecified mechanism ([place])  History of diabetes mellitus    Rx / DC Orders ED Discharge Orders     None         Sekou Zuckerman A, PA-C 12/21/21 0631    Palumbo, April, MD 12/21/21 3736

## 2021-12-21 NOTE — ED Notes (Signed)
Secure chat sent to Dr, Tamala Julian regarding pt complaints of headache and visual changes

## 2021-12-21 NOTE — ED Notes (Signed)
PURWICK IN PLACE 

## 2021-12-21 NOTE — ED Notes (Signed)
Pt up ambulating to restroom without assistance with significant other beside her

## 2021-12-21 NOTE — ED Notes (Signed)
Entered pt room to complete assessment pt is requesting to use the interpretor attempted to use the machine and it kept [place] on hold for a while. Advised her that this RN would return

## 2021-12-21 NOTE — Plan of Care (Addendum)
Neurology Consult Plan of Care  Patient seen for neurology consult earlier today by Dr. Lorrin Goodell.Please see his detailed consult from earlier today for detailed information.  In brief Ariea Rochin is a 46 y.o. female who presented to [facility] ED with headache since 12/19/21 at noon along with right blurred vision and feeling off balance. She was seen and examined at the bedside today by Dr. Leonie Man and myself.    Stroke work up in progress with the following recs: - Frequent Neuro checks per stroke unit protocol - Recommend Vascular imaging with CT Angio head and neck- no emergent vascular finding, atherosclerosis without flow limiting stenosis of major vessels. Left BG and Left occipital cortex calcifications. - Recommend obtaining TTE- EF 60-65% and no shunt - Recommend obtaining Lipid panel with LDL- 55 and triglycerides 224 - Was on home Lipitor '40mg'$  and discontinued. Tricor '145mg'$  increased to 160 inpatient  - Recommend HbA1c-11.6 - Antithrombotic - Aspirin '81mg'$  daily along with plavix '75mg'$  daily x 21days followed by Aspirin '81mg'$  daily alone. - Recommend DVT ppx - SBP goal - permissive hypertension first 24 h < 220/110. Held home meds.  - Recommend Telemetry monitoring for arrythmia - Recommend bedside swallow screen prior to PO intake. - Stroke education booklet - Recommend PT/OT/SLP consult - Discussed with patient that she should stop driving due to hemianopsia and will need formal driving evaluation to get clearance to resume driving in the future.  Sonia Side, AGNP-BC [facility] [phone]  From 7a-7p please page over night on-call Neurologist  12/21/2021 / 4:04 PM   STROKE MD NOTE :  I have personally obtained history,examined this patient, reviewed notes, independently viewed imaging studies, participated in medical decision making and plan of care.ROS completed by me personally and pertinent positives fully documented  I have made any  additions or clarifications directly to the above note. Agree with note above.  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  CT angiogram shows only mild atherosclerotic changes without large vessel stenosis or occlusion.  Echocardiogram is pending.  Recommend further evaluation by checking echocardiogram and if unyielding may consider doing TEE later in TCD bubble study for PFO.  Aspirin and Plavix for 3 weeks followed by aspirin alone and aggressive risk factor modification.  Patient advised not to drive.  Long discussion with patient and son at the bedside and answered questions.  Greater than 50% time during this 35-minute visit was spent on counseling and coordination of care about her embolic stroke and discussion about stroke evaluation and treatment and answering questions.  Antony Contras, MD Medical Director [facility] Pager: [phone] 12/21/2021 4:40 PM

## 2021-12-21 NOTE — ED Notes (Signed)
Received verbal report from Earlie Lou RN at this time

## 2021-12-21 NOTE — Progress Notes (Signed)
TCD w/ bubble study completed.   Please see CV Proc for preliminary results.   Trampus Mcquerry, RDMS, RVT  

## 2021-12-21 NOTE — Progress Notes (Addendum)
Inpatient Diabetes Program Recommendations  AACE/ADA: New Consensus Statement on Inpatient Glycemic Control (2015)  Target Ranges:  Prepandial:   less than 140 mg/dL      Peak postprandial:   less than 180 mg/dL (1-2 hours)      Critically ill patients:  140 - 180 mg/dL   Lab Results  Component Value Date   GLUCAP 306 (H) 12/21/2021   HGBA1C 11.6 (H) 12/21/2021    Review of Glycemic Control  Latest Reference Range & Units 12/20/21 16:12 12/20/21 21:46 12/21/21 08:15  Glucose-Capillary 70 - 99 mg/dL 199 (H) 230 (H) 306 (H)   Diabetes history: DM 2 Outpatient Diabetes medications:  Glucotrol 10 mg bid Basaglar 60 units q HS Humalog 12 units tid with meals  Metformin 1000 mg bid Current orders for Inpatient glycemic control:  Novolog 0-9 units tid with meals Novolog 12 units tid with meals Semglee 20 units daily Inpatient Diabetes Program Recommendations:    May consider increasing Semglee to 30 units daily.  Will talk to patient on 6/21 regarding A1C and DM control.   Thanks,  Adah Perl, RN, BC-ADM Inpatient Diabetes Coordinator Pager [phone]  (8a-5p)

## 2021-12-21 NOTE — ED Notes (Signed)
ECHO at bedside.

## 2021-12-21 NOTE — H&P (Signed)
History and Physical    Patient: Julie Robertson BTD:[id number] DOB: 1975-08-10 DOA: 12/20/2021 DOS: the patient was seen and examined on 12/21/2021 PCP: Kerin Perna, NP  Patient coming from: Home  Chief Complaint:  Chief Complaint  Patient presents with   Blurred Vision    Headache   HPI: Julie Robertson is a 46 y.o. female with medical history significant of dyslipidemia, diabetes mellitus type 2, CBD mass s/p resection, obesity, GERD who presents with complaints of not feeling well.  Symptoms started 2 days ago around noon.  History is obtained with use of interpreter services although limited as patient is lethargic from receiving Ativan earlier to undergo imaging.  She reports seeing lights in both eyes that she describes as being like a disco ball.  She reported associated symptoms of frontal headache and feeling dizzy.  She reports almost getting into her car accident due to the symptoms.  Denies having any chest pain, palpitations, fever, chills, shortness of breath, cough, nausea, vomiting, or dysuria symptoms.  Her blood sugars have been in the 200s.  She has not had a routine eye exam.  Upon admission into the emergency department patient was noted to be afebrile with mild tachypnea, and all other vital signs maintained.  CT scan of the head did not note any acute abnormality.  Labs significant for hemoglobin 11.1 with low MCV, glucose 216, and calcium 8.8.  MRI of the brain significant for patchy acute cortical infarcts left occipital lobe.  Urine pregnancy screen was negative.  Patient had been given 1 L normal saline IV fluids, Compazine, ketorolac, Benadryl, and Ativan.  Neurology recommended admission   Review of Systems: As mentioned in the history of present illness. All other systems reviewed and are negative. 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  numbness tingling in right fingers. rx with Gabapentin.   Dyslipidemia 08/2009   Dyspnea    Gall stones 08/2009   GERD (gastroesophageal reflux disease)    Obesity    BMI 41, 250# 03/2015   Past Surgical History:  Procedure Laterality Date   APPLICATION OF WOUND VAC N/A 05/09/2019   Procedure: APPLICATION OF WOUND VAC;  Surgeon: Olean Ree, MD;  Location: [facility];  Service: General;  Laterality: N/A;  [id number]   CHOLECYSTECTOMY N/A 03/10/2015   Procedure: LAPAROSCOPIC CHOLECYSTECTOMY WITH INTRAOPERATIVE CHOLANGIOGRAM;  Surgeon: Georganna Skeans, MD;  Location: [place];  Service: General;  Laterality: N/A;   ERCP N/A 03/11/2015   Procedure: ENDOSCOPIC RETROGRADE CHOLANGIOPANCREATOGRAPHY (ERCP);  Surgeon: Milus Banister, MD;  Location: [place];  Service: Endoscopy;  Laterality: N/A;   ESOPHAGOGASTRODUODENOSCOPY (EGD) WITH PROPOFOL N/A 07/25/2018   Procedure: ESOPHAGOGASTRODUODENOSCOPY (EGD) WITH PROPOFOL;  Surgeon: Rush Landmark Telford Nab., MD;  Location: [facility];  Service: Gastroenterology;  Laterality: N/A;   INSERTION OF MESH N/A 05/09/2019   Procedure: INSERTION OF MESH;  Surgeon: Olean Ree, MD;  Location: [facility];  Service: General;  Laterality: N/A;   Transduodenal Ampullectomy  09/2015   TUBAL LIGATION     VENTRAL HERNIA REPAIR N/A 05/09/2019   Procedure: HERNIA REPAIR VENTRAL ADULT with MESH;  Surgeon: Olean Ree, MD;  Location: [facility];  Service: General;  Laterality: N/A;   Social History:  reports that she has been smoking cigarettes. She has a 6.00 pack-year smoking history. She has never used smokeless tobacco. She  reports current alcohol use. She reports that she does not use drugs.  No Known Allergies  Family History  Problem Relation Age of Onset   Diabetes Mother    Heart disease Mother    Hypertension Mother    Diabetes Father    Bone cancer Maternal Grandfather    Kidney disease Maternal Uncle    Other Son        had kidney  removed due to gun shot wound    Prior to Admission medications   Medication Sig Start Date End Date Taking? Authorizing Provider  albuterol (VENTOLIN HFA) 108 (90 Base) MCG/ACT inhaler Inhale 2 puffs into the lungs every 6 (six) hours as needed for wheezing or shortness of breath. 12/04/18   Kerin Perna, NP  atorvastatin (LIPITOR) 40 MG tablet Take 1 tablet (40 mg total) by mouth at bedtime. 09/01/21 09/01/22  Kerin Perna, NP  Blood Glucose Monitoring Suppl (TRUE METRIX METER) w/Device KIT Use as directed 04/12/17   Alfonse Spruce, FNP  Blood Glucose Monitoring Suppl (TRUE METRIX METER) w/Device KIT use up to 4 times daily as directed 04/12/21   Kerin Perna, NP  fenofibrate (TRICOR) 145 MG tablet TAKE 1 TABLET (145 MG TOTAL) BY MOUTH DAILY. 09/06/21 09/06/22  Kerin Perna, NP  gabapentin (NEURONTIN) 300 MG capsule Take 1 capsule (300 mg total) by mouth 3 (three) times daily. 10/06/21 10/06/22  Kerin Perna, NP  glipiZIDE (GLUCOTROL) 10 MG tablet TAKE 1 TABLET (10 MG TOTAL) BY MOUTH 2 (TWO) TIMES DAILY BEFORE A MEAL. 08/30/21 08/30/22  Kerin Perna, NP  glucose blood (TRUE METRIX BLOOD GLUCOSE TEST) test strip Use as instructed 03/22/18   Fulp, Cammie, MD  glucose blood test strip use up to 4 times daily as directed 04/12/21   Kerin Perna, NP  Insulin Glargine [facility]) 100 UNIT/ML INJECT 60 UNITS INTO THE SKIN AT BEDTIME. 08/30/21 08/30/22  Kerin Perna, NP  insulin lispro (HUMALOG) 100 UNIT/ML KwikPen INJECT 12 UNITS INTO THE SKIN 3 TIMES DAILY PER SLIDING SCALE 08/30/21 08/30/22  Kerin Perna, NP  Insulin Pen Needle 31G X 5 MM MISC Inject 10 units subcutaneous at bedtime 09/01/20   Kerin Perna, NP  linaclotide [facility]) 145 MCG CAPS capsule Take 1 capsule (145 mcg total) by mouth daily before breakfast. 08/30/21   Kerin Perna, NP  losartan (COZAAR) 25 MG tablet TAKE 1 TABLET (25 MG TOTAL) BY MOUTH DAILY. 08/30/21 08/30/22   Kerin Perna, NP  metFORMIN (GLUCOPHAGE) 1000 MG tablet TAKE 1 TABLET (1,000 MG TOTAL) BY MOUTH 2 (TWO) TIMES DAILY WITH A MEAL. 08/30/21 08/30/22  Kerin Perna, NP  naproxen (NAPROSYN) 375 MG tablet Take 1 tablet (375 mg total) by mouth 2 (two) times daily. 11/08/21   Dorie Rank, MD  omeprazole (PRILOSEC) 20 MG capsule TAKE 1 CAPSULE (20 MG TOTAL) BY MOUTH DAILY. 10/06/21   Kerin Perna, NP  sorbitol 70 % SOLN Take 30 mLs by mouth daily as needed for moderate constipation. 09/10/20   Kerin Perna, NP  TRUEplus Lancets 28G MISC use up to 4 times daily as directed 04/12/21   Kerin Perna, NP    Physical Exam: Vitals:   12/20/21 1606 12/20/21 2024 12/21/21 0201 12/21/21 0619  BP: 119/72 110/60 114/65 109/76  Pulse: 77 68 84 69  Resp: _0 (!) 24  Temp: 98.2 F (36.8 C)   98.1 F (36.7 C)  TempSrc: Oral  Oral  SpO2: 100% 95% 99% 96%   Exam  Constitutional: Obese middle-age female who appears to be in no acute distress Eyes: lids and conjunctivae normal ENMT: Mucous membranes are moist. Posterior pharynx clear of any exudate or lesions.   Neck: normal, supple, no masses  Respiratory: clear to auscultation bilaterally, no wheezing, no crackles. Normal respiratory effort. No accessory muscle use.  Cardiovascular: Regular rate and rhythm, no murmurs / rubs / gallops. No extremity edema. Abdomen: Healed midline abdominal scar. Musculoskeletal: no clubbing / cyanosis. No joint deformity upper and lower extremities. Good ROM, no contractures. Normal muscle tone.  Skin: no rashes, lesions, ulcers. No induration Neurologic: CN 2-12 grossly intact. Sensation intact.  Strength 5/5 in all 4.  Partial right hemianopsia Psychiatric: Normal judgment and insight. Alert and oriented x 3. Normal mood.   Data Reviewed:  EKG reveals sinus rhythm at 73 bpm. Reviewed labs, imaging, and pertinent records as noted above in the  Assessment and Plan: CVA Acute.  Patient  presented with complaints of vision changes and headache.  MRI significant for patchy acute cortical infarcts of the left occipital lobe with extension along the posterior left watershed.  Neurology consulted.  Risk factors include hypertension, uncontrolled diabetes, tobacco abuse, and obesity -Admit to telemetry bed -Stroke order set initiated -Neuro checks -Check CTA of the head and neck -Check Hemoglobin A1c and lipid panel in a.m. -Check echocardiogram -PT/OT/Speech to eval and treat -ASA and Plavix for 21 days, then aspirin alone -Transitions of care consulted -Appreciate neurology consultative services, will follow-up for any further recommendations  Essential hypertension Home medication regimen includes losartan 25 mg daily. -Initially held losartan to allow for permissive hypertension in the setting of acute stroke  Dyslipidemia Home medication regimen includes atorvastatin 40 mg nightly and Tricor 140 mg daily. -Check lipid panel -Goal LDL less than 70 -Continue home medication regimen  Uncontrolled diabetes mellitus type 2 with hyperglycemia, with long-term use of insulin On admission glucose elevated at 216 without anion gap.  Patient's last hemoglobin A1c was 11.6 in 08/2021.  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  Surgical pathology did not note.high-grade dysplasia or malignancy.  Iron deficiency anemia Chronic.  On admission hemoglobin 11.1 g/dL with low MCV and MCH.  No reports of bleeding. -Continue to  monitor  Tobacco abuse Patient still smokes 1- 2 packs/day on average. -Continue to counsel on need of cessation of tobacco use -Nicotine patch offered.  DVT prophylaxis: Lovenox Advance Care Planning:   Code Status: Full Code    Consults: Neurology  Family Communication: Son updated at bedside  Severity of Illness: The appropriate patient status for this patient is INPATIENT. Inpatient status is judged to be reasonable and necessary in order to provide the required intensity of service to ensure the patient's safety. The patient's presenting symptoms, physical exam findings, and initial radiographic and laboratory data in the context of their chronic comorbidities is felt to place them at high risk for further clinical deterioration. Furthermore, it is not anticipated that the patient will be medically stable for discharge from  the hospital within 2 midnights of admission.   * I certify that at the point of admission it is my clinical judgment that the patient will require inpatient hospital care spanning beyond 2 midnights from the point of admission due to high intensity of service, high risk for further deterioration and high frequency of surveillance required.*  Author: Norval Morton, MD 12/21/2021 7:09 AM  For on call review [email or web address].

## 2021-12-21 NOTE — Consult Note (Signed)
NEUROLOGY CONSULTATION NOTE   Date of service: December 21, 2021 Patient Name: Julie Robertson MRN:  [id number] DOB:  May 19, 1976 Reason for consult: "headache and blurred vision on the right" Requesting Provider: Veatrice Kells, MD _ _ _   _ __   _ __ _ _  __ __   _ __   __ _  History of Present Illness  Julie Robertson is a 46 y.o. female with PMH significant for DM2, HLD, GERD, obesity who presents with headache since 6/18 noon along with R blurred vision and feeling off balance. She had workup with CTH which was negative. MRI Brain demonstrated patchy acute L occipital stroke.  She smokes 1-2 pack a day, Diabetes is not well controlled. No other recreational substances.  LKW: 12/19/21. mRS: 0 tNKASE: not offered, outside window. Thrombectomy: not offered, outside window at presentation. NIHSS components Score: Comment  1a Level of Conscious 0'[x]'$  1'[]'$  2'[]'$  3'[]'$      1b LOC Questions 0'[x]'$  1'[]'$  2'[]'$       1c LOC Commands 0'[x]'$  1'[]'$  2'[]'$       2 Best Gaze 0'[x]'$  1'[]'$  2'[]'$       3 Visual 0'[]'$  1'[x]'$  2'[]'$  3'[]'$      4 Facial Palsy 0'[x]'$  1'[]'$  2'[]'$  3'[]'$      5a Motor Arm - left 0'[x]'$  1'[]'$  2'[]'$  3'[]'$  4'[]'$  UN'[]'$    5b Motor Arm - Right 0'[x]'$  1'[]'$  2'[]'$  3'[]'$  4'[]'$  UN'[]'$    6a Motor Leg - Left 0'[x]'$  1'[]'$  2'[]'$  3'[]'$  4'[]'$  UN'[]'$    6b Motor Leg - Right 0'[x]'$  1'[]'$  2'[]'$  3'[]'$  4'[]'$  UN'[]'$    7 Limb Ataxia 0'[x]'$  1'[]'$  2'[]'$  3'[]'$  UN'[]'$     8 Sensory 0'[x]'$  1'[]'$  2'[]'$  UN'[]'$      9 Best Language 0'[x]'$  1'[]'$  2'[]'$  3'[]'$      10 Dysarthria 0'[x]'$  1'[]'$  2'[]'$  UN'[]'$      11 Extinct. and Inattention 0'[x]'$  1'[]'$  2'[]'$       TOTAL: 1      ROS   Constitutional Denies weight loss, fever and chills.   HEENT + changes in vision, no changes in hearing.   Respiratory Denies SOB and cough.   CV Denies palpitations and CP  GI Denies abdominal pain, nausea, vomiting and diarrhea.   GU Denies dysuria and urinary frequency.   MSK Denies myalgia and joint pain.   Skin Denies rash and pruritus.   Neurological Denies headache and syncope.   Psychiatric Denies recent changes in mood. Denies  anxiety and depression.    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  numbness tingling in right fingers. rx with Gabapentin.   Dyslipidemia 08/2009   Dyspnea    Gall stones 08/2009   GERD (gastroesophageal reflux disease)    Obesity    BMI 41, 250# 03/2015   Past Surgical History:  Procedure Laterality Date   APPLICATION OF WOUND VAC N/A 05/09/2019   Procedure: APPLICATION OF WOUND VAC;  Surgeon: Olean Ree, MD;  Location: [facility];  Service: General;  Laterality: N/A;  [id number]   CHOLECYSTECTOMY N/A 03/10/2015   Procedure: LAPAROSCOPIC CHOLECYSTECTOMY WITH INTRAOPERATIVE CHOLANGIOGRAM;  Surgeon: Georganna Skeans, MD;  Location: [place];  Service: General;  Laterality: N/A;   ERCP N/A 03/11/2015   Procedure: ENDOSCOPIC RETROGRADE CHOLANGIOPANCREATOGRAPHY (ERCP);  Surgeon: Milus Banister, MD;  Location: [place];  Service: Endoscopy;  Laterality: N/A;   ESOPHAGOGASTRODUODENOSCOPY (EGD) WITH  PROPOFOL N/A 07/25/2018   Procedure: ESOPHAGOGASTRODUODENOSCOPY (EGD) WITH PROPOFOL;  Surgeon: Rush Landmark Telford Nab., MD;  Location: [facility];  Service: Gastroenterology;  Laterality: N/A;   INSERTION OF MESH N/A 05/09/2019   Procedure: INSERTION OF MESH;  Surgeon: Olean Ree, MD;  Location: [facility];  Service: General;  Laterality: N/A;   Transduodenal Ampullectomy  09/2015   TUBAL LIGATION     VENTRAL HERNIA REPAIR N/A 05/09/2019   Procedure: HERNIA REPAIR VENTRAL ADULT with MESH;  Surgeon: Olean Ree, MD;  Location: [facility];  Service: General;  Laterality: N/A;   Family History  Problem Relation Age of Onset   Diabetes Mother    Heart disease Mother    Hypertension Mother    Diabetes Father    Bone cancer Maternal Grandfather    Kidney disease Maternal Uncle    Other Son        had kidney removed due to gun shot wound   Social History    Socioeconomic History   Marital status: Married    Spouse name: Not on file   Number of children: 2   Years of education: Not on file   Highest education level: Not on file  Occupational History   Occupation: house cleaning  Tobacco Use   Smoking status: Every Day    Packs/day: 1.50    Years: 4.00    Total pack years: 6.00    Types: Cigarettes   Smokeless tobacco: Never  Vaping Use   Vaping Use: Never used  Substance and Sexual Activity   Alcohol use: Yes    Comment: rare   Drug use: No   Sexual activity: Not on file  Other Topics Concern   Not on file  Social History Narrative   Patient has 2 sons one is 47, the other 44 as of 03/2015. Her kids are not the children of her current husband. As of 03/2015 she is not employed outside the home.   Social Determinants of Health   Financial Resource Strain: Not on file  Food Insecurity: Not on file  Transportation Needs: Not on file  Physical Activity: Not on file  Stress: Not on file  Social Connections: Not on file   No Known Allergies  Medications  (Not in a hospital admission)    Vitals   Vitals:   12/20/21 1606 12/20/21 2024 12/21/21 0201  BP: 119/72 110/60 114/65  Pulse: 77 68 84  Resp: '16 17 18  '$ Temp: 98.2 F (36.8 C)    TempSrc: Oral    SpO2: 100% 95% 99%     There is no height or weight on file to calculate BMI.  Physical Exam   General: Laying comfortably in bed; in no acute distress.  HENT: Normal oropharynx and mucosa. Normal external appearance of ears and nose.  Neck: Supple, no pain or tenderness  CV: No JVD. No peripheral edema.  Pulmonary: Symmetric Chest rise. Normal respiratory effort.  Abdomen: Soft to touch, non-tender.  Ext: No cyanosis, edema, or deformity  Skin: No rash. Normal palpation of skin.   Musculoskeletal: Normal digits and nails by inspection. No clubbing.   Neurologic Examination  Mental status/Cognition: Alert, oriented to self, place, month and year, good attention.   Speech/language: Fluent, comprehension intact, object naming intact, repetition intact.  Cranial nerves:   CN II Pupils equal and reactive to light, Partial R hemianopsia.   CN III,IV,VI EOM intact, no gaze preference or deviation, no nystagmus    CN V normal sensation in V1, V2,  and V3 segments bilaterally    CN VII no asymmetry, no nasolabial fold flattening    CN VIII normal hearing to speech    CN IX & X normal palatal elevation, no uvular deviation    CN XI 5/5 head turn and 5/5 shoulder shrug bilaterally    CN XII midline tongue protrusion    Motor:  Muscle bulk: poor, tone normal, pronator drift none tremor none Mvmt Root Nerve  Muscle Right Left Comments  SA C5/6 Ax Deltoid 4+ 4+   EF C5/6 Mc Biceps 5 5   EE C6/7/8 Rad Triceps 5 5   WF C6/7 Med FCR     WE C7/8 PIN ECU     F Ab C8/T1 U ADM/FDI 5 5   HF L1/2/3 Fem Illopsoas 4+ 4+   KE L2/3/4 Fem Quad 5 5   DF L4/5 D Peron Tib Ant 5 5   PF S1/2 Tibial Grc/Sol 5 5    Reflexes:  Right Left Comments  Pectoralis      Biceps (C5/6) 2 2   Brachioradialis (C5/6) 2 2    Triceps (C6/7) 2 2    Patellar (L3/4) 2 2    Achilles (S1)      Hoffman      Plantar     Jaw jerk    Sensation:  Light touch Intact throughout   Pin prick    Temperature    Vibration   Proprioception    Coordination/Complex Motor:  - Finger to Nose intact BL - Heel to shin intact BL - Rapid alternating movement are normal - Gait: Deferred.  Labs   CBC:  Recent Labs  Lab 12/20/21 1616  WBC 8.2  NEUTROABS 3.9  HGB 11.1*  HCT 36.8  MCV 74.6*  PLT 734    Basic Metabolic Panel:  Lab Results  Component Value Date   NA 136 12/20/2021   K 3.8 12/20/2021   CO2 20 (L) 12/20/2021   GLUCOSE 216 (H) 12/20/2021   BUN 16 12/20/2021   CREATININE 0.81 12/20/2021   CALCIUM 8.8 (L) 12/20/2021   GFRNONAA >60 12/20/2021   GFRAA >60 05/10/2019   Lipid Panel:  Lab Results  Component Value Date   LDLCALC 121 (H) 08/30/2021   HgbA1c:  Lab  Results  Component Value Date   HGBA1C 11.6 (A) 08/30/2021   Urine Drug Screen: No results found for: "LABOPIA", "COCAINSCRNUR", "LABBENZ", "AMPHETMU", "THCU", "LABBARB"  Alcohol Level No results found for: "ETH"  CT Head without contrast(Personally reviewed): CTH was negative for a large hypodensity concerning for a large territory infarct or hyperdensity concerning for an ICH  CT angio Head and Neck with contrast: pending  MRI Brain(Personally reviewed): Patchy L occipital stroke and in the L MCA/PCA watershed territory.  Impression   Julie Robertson is a 46 y.o. female with PMH significant for DM2, HLD, GERD, obesity who presents with headache since 6/18 noon along with R blurred vision and feeling off balance. Found to have a patchy L occipital stroke and in the L MCA/PCA watershed territory. The stroke appears embolic in nature. Her neurologic examination is notable for partial R hemianopsia.  Recommendations   - Frequent Neuro checks per stroke unit protocol - Recommend Vascular imaging with CT Angio head and neck. - Recommend obtaining TTE - Recommend obtaining Lipid panel with LDL - Please start statin if LDL > 70 - Recommend HbA1c - Antithrombotic - Aspirin '81mg'$  daily along with plavix '75mg'$  daily x 21days followed by Aspirin '81mg'$   daily alone. - Recommend DVT ppx - SBP goal - permissive hypertension first 24 h < 220/110. Held home meds.  - Recommend Telemetry monitoring for arrythmia - Recommend bedside swallow screen prior to PO intake. - Stroke education booklet - Recommend PT/OT/SLP consult - Discussed with patient that she should stop driving due to hemianopsia and will need formal driving evaluation to get clearance to resume driving in the future.   ______________________________________________________________________   Thank you for the opportunity to take part in the care of this patient. If you have any further questions, please contact the neurology  consultation attending.  Signed,  [place] Pager Number [phone] _ _ _   _ __   _ __ _ _  __ __   _ __   __ _

## 2021-12-21 NOTE — ED Notes (Signed)
STILL IN MRI

## 2021-12-22 ENCOUNTER — Inpatient Hospital Stay (HOSPITAL_COMMUNITY): Payer: Self-pay

## 2021-12-22 DIAGNOSIS — I639 Cerebral infarction, unspecified: Secondary | ICD-10-CM

## 2021-12-22 LAB — GLUCOSE, CAPILLARY
Glucose-Capillary: 255 mg/dL — ABNORMAL HIGH (ref 70–99)
Glucose-Capillary: 289 mg/dL — ABNORMAL HIGH (ref 70–99)
Glucose-Capillary: 333 mg/dL — ABNORMAL HIGH (ref 70–99)
Glucose-Capillary: 351 mg/dL — ABNORMAL HIGH (ref 70–99)
Glucose-Capillary: 372 mg/dL — ABNORMAL HIGH (ref 70–99)

## 2021-12-22 LAB — COMPREHENSIVE METABOLIC PANEL
ALT: 33 U/L (ref 0–44)
AST: 37 U/L (ref 15–41)
Albumin: 2.8 g/dL — ABNORMAL LOW (ref 3.5–5.0)
Alkaline Phosphatase: 56 U/L (ref 38–126)
Anion gap: 7 (ref 5–15)
BUN: 16 mg/dL (ref 6–20)
CO2: 23 mmol/L (ref 22–32)
Calcium: 8.6 mg/dL — ABNORMAL LOW (ref 8.9–10.3)
Chloride: 106 mmol/L (ref 98–111)
Creatinine, Ser: 0.92 mg/dL (ref 0.44–1.00)
GFR, Estimated: >60 mL/min (ref >60)
Glucose, Bld: 285 mg/dL — ABNORMAL HIGH (ref 70–99)
Potassium: 4 mmol/L (ref 3.5–5.1)
Sodium: 136 mmol/L (ref 135–145)
Total Bilirubin: 0.4 mg/dL (ref 0.3–1.2)
Total Protein: 5.9 g/dL — ABNORMAL LOW (ref 6.5–8.1)

## 2021-12-22 LAB — CBC
HCT: 30.4 % — ABNORMAL LOW (ref 36.0–46.0)
Hemoglobin: 9.5 g/dL — ABNORMAL LOW (ref 12.0–15.0)
MCH: 22.6 pg — ABNORMAL LOW (ref 26.0–34.0)
MCHC: 31.3 g/dL (ref 30.0–36.0)
MCV: 72.4 fL — ABNORMAL LOW (ref 80.0–100.0)
Platelets: 305 10*3/uL (ref 150–400)
RBC: 4.2 MIL/uL (ref 3.87–5.11)
RDW: 15.6 % — ABNORMAL HIGH (ref 11.5–15.5)
WBC: 6.9 10*3/uL (ref 4.0–10.5)
nRBC: 0 % (ref 0.0–0.2)

## 2021-12-22 MED ORDER — INSULIN ASPART 100 UNIT/ML IJ SOLN
0.0000 [IU] | Freq: Every day | INTRAMUSCULAR | Status: DC
  Administered 2021-12-22 (×2): 5 [IU] via SUBCUTANEOUS
  Administered 2021-12-23: 3 [IU] via SUBCUTANEOUS

## 2021-12-22 MED ORDER — SUMATRIPTAN SUCCINATE 50 MG PO TABS
50.0000 mg | ORAL_TABLET | ORAL | Status: DC | PRN
  Administered 2021-12-23 – 2021-12-24 (×2): 50 mg via ORAL
  Filled 2021-12-22 (×3): qty 1

## 2021-12-22 MED ORDER — INSULIN GLARGINE-YFGN 100 UNIT/ML ~~LOC~~ SOLN
25.0000 [IU] | Freq: Every day | SUBCUTANEOUS | Status: DC
  Filled 2021-12-22: qty 0.25

## 2021-12-22 MED ORDER — POLYETHYLENE GLYCOL 3350 17 G PO PACK
17.0000 g | PACK | Freq: Every day | ORAL | Status: DC | PRN
  Administered 2021-12-22: 17 g via ORAL
  Filled 2021-12-22 (×3): qty 1

## 2021-12-22 MED ORDER — INSULIN GLARGINE-YFGN 100 UNIT/ML ~~LOC~~ SOLN
30.0000 [IU] | Freq: Every day | SUBCUTANEOUS | Status: DC
  Administered 2021-12-22: 30 [IU] via SUBCUTANEOUS
  Filled 2021-12-22 (×2): qty 0.3

## 2021-12-22 MED ORDER — INSULIN ASPART 100 UNIT/ML IJ SOLN
0.0000 [IU] | Freq: Three times a day (TID) | INTRAMUSCULAR | Status: DC
  Administered 2021-12-22: 11 [IU] via SUBCUTANEOUS
  Administered 2021-12-23: 8 [IU] via SUBCUTANEOUS
  Administered 2021-12-23: 5 [IU] via SUBCUTANEOUS
  Administered 2021-12-23: 8 [IU] via SUBCUTANEOUS
  Administered 2021-12-24: 5 [IU] via SUBCUTANEOUS
  Administered 2021-12-24: 8 [IU] via SUBCUTANEOUS

## 2021-12-22 MED ORDER — KETOROLAC TROMETHAMINE 15 MG/ML IJ SOLN
15.0000 mg | Freq: Four times a day (QID) | INTRAMUSCULAR | Status: DC | PRN
Start: 2021-12-22 — End: 2021-12-24
  Administered 2021-12-22 – 2021-12-24 (×5): 15 mg via INTRAVENOUS
  Filled 2021-12-22 (×5): qty 1

## 2021-12-22 MED ORDER — PNEUMOCOCCAL 20-VAL CONJ VACC 0.5 ML IM SUSY
0.5000 mL | PREFILLED_SYRINGE | INTRAMUSCULAR | Status: DC
  Filled 2021-12-22: qty 0.5

## 2021-12-22 NOTE — Inpatient Diabetes Management (Addendum)
Inpatient Diabetes Program Recommendations  AACE/ADA: New Consensus Statement on Inpatient Glycemic Control (2015)  Target Ranges:  Prepandial:   less than 140 mg/dL      Peak postprandial:   less than 180 mg/dL (1-2 hours)      Critically ill patients:  140 - 180 mg/dL    Latest Reference Range & Units 12/21/21 08:15 12/21/21 12:12 12/21/21 16:31 12/22/21 00:21  Glucose-Capillary 70 - 99 mg/dL 306 (H)  11 units Novolog  259 (H)  17 units Novolog  20 units Semglee _0   196 (H)  14 units Novolog  372 (H)  5 units Novolog   (H): Data is abnormally high  Latest Reference Range & Units 12/22/21 06:07  Glucose-Capillary 70 - 99 mg/dL 255 (H)  (H): Data is abnormally high  Latest Reference Range & Units 08/30/21 11:50 12/21/21 09:25  Hemoglobin A1C 4.8 - 5.6 % 11.6 ! 11.6 (H)  (286 mg/dl)    Admit with: CVA  History: DM  Home DM Meds: Glipizide 10 mg BID        Basaglar 60 units QHS        Humalog 12 units TID with meals        Metformin 1000 mg BID  Current Orders: Semglee 25 units Daily      Novolog Sensitive Correction Scale/ SSI (0-9 units) TID AC + HS      Novolog 12 units TID with meals    PCP: Juluis Mire, NP with [facility] Last seen 08/30/2021     MD- Note Semglee increased to 25 units Daily this AM (pt received 20 units yest AM) Per home Med Rec, pt supposed to be taking 60 units Basaglar daily at home  Please consider increasing the Semglee a bit more to 30 units daily today (50% home dose)--AM CBG today 255   Addendum 12pm--Met w/ pt at bedside using [place] interpreter [place] [phone].  Confirmed home DM meds listed above.  Pt told me she is taking the Basaglar, Metformin, and Glipizide regularly, however, she is NOT taking the Humalog.  Per pt, she does not like taking injections and would only like to take one injection a day versus 4 per day.  States she only eats 1 meal per day and was told to only take the Humalog  when she eats.  Does not have CBG meter at home--States her meter is broken.  Wants to find a new PCP.    Spoke with patient about her current A1c of  11.6%.  Explained what an A1c is and what it measures.  Reminded patient that her goal A1c is 7% or less per ADA standards to prevent both acute and long-term complications.  Explained to patient the importance of good glucose control at home especially in light of her new CVA--Discussed with pt the extreme importance of good CBG control to prevent further CVA, MI, and other diabetes vascular complications.    Encouraged patient to check her CBGs at least TID and to record all CBGs in a logbook for her PCP to review.  Also reviewed with pt goal CBGs for home.    Pt told me she is only eating 1 meal per day.  We talked about the difference between the Basaglar and Humalog insulins and why each are important and how each work.  Encouraged pt to at least take her Humalog with her 1 large meal a day and to always take the Basaglar and oral meds.  Encouraged pt to go  to [facility] and purchase an inexpensive CBG meter OTC so she can start checking CBGs again.  Assisted pt to order her lunch meal over the phone.       --Will follow patient during hospitalization--  Wyn Quaker RN, MSN, CDE Diabetes Coordinator Inpatient Glycemic Control Team Team Pager: [phone] (8a-5p)

## 2021-12-22 NOTE — Progress Notes (Signed)
TRH night cross cover note:   I was notified by RN that the patient's nightly blood sugar at 0020 is 372.  No nightly sliding scale insulin ordered.  The patient does have orders for scheduled NovoLog with meals, sliding scale insulin with meals, as well as basal insulin in the form of Lantus 20 units SQ every morning, with most recent such dose administered on the morning of 12/21/2021.  Per chart review, most recent hemoglobin A1c noted to be 11.6, associated with average blood sugar of approximately 300.  It appears that the patient's fasting morning blood sugar on the morning of 12/21/2021 was also greater than 300.  I subsequent placed order for nightly signs consult as well as increased basal insulin dose from 20 to 25 units every morning.     Babs Bertin, DO Hospitalist

## 2021-12-22 NOTE — Progress Notes (Signed)
Stroke Team Progress Note  SUBJECTIVE Patient lying in bed comfortably.  There is a female family member at the bedside.  Patient continues to have right-sided peripheral vision loss.  This visit was performed using Spanish language video interpreter was present throughout the meeting.  OBJECTIVE Most recent Vital Signs: Temp: 98.2 F (36.8 C) (06/21 1227) Temp Source: Oral (06/21 1227) BP: 112/68 (06/21 1227) Pulse Rate: 74 (06/21 1227) Respiratory Rate: 18 O2 Saturdation: 96%  CBG (last 3)  Recent Labs    12/22/21 0021 12/22/21 0607 12/22/21 1228  GLUCAP 372* 255* 289*       Studies:  CT head no acute abnormality. MRI brain acute cortical infarct in left occipital lobe.  Remote perforator infarct right corona radiata CTA angio head and neck no large vessel stenosis or occlusion. ECHO ejection fraction 60-65% no shunt LDL 55 mg percent.  Triglycerides 224 mg percent HbA1c 11.6  Physical Exam:    Obese middle-aged Hispanic lady not in distress. . Afebrile. Head is nontraumatic. Neck is supple without bruit.    Cardiac exam no murmur or gallop. Lungs are clear to auscultation. Distal pulses are well felt.  Neurological Exam ;  Awake  Alert oriented x 3. Normal speech and language.eye movements full without nystagmus.fundi were not visualized. Vision acuity   appears normal.  Dense right homonymous hemianopsia.  Hearing is normal. Palatal movements are normal. Face symmetric. Tongue midline. Normal strength, tone, reflexes and coordination. Normal sensation. Gait deferred.  ASSESSMENT [name] is a 46 y.o. 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  Vascular risk factors of obesity, diabetes, hyperlipidemia, hypertension and at risk for sleep apnea.    Hospital day # 1  TREATMENT/PLAN I had a long discussion with the patient and female family member at the bedside using Spanish language video interpreter about his stroke and  discussion about plan for evaluation and treatment and answered questions.  Continue aspirin and Plavix for 3 weeks followed by aspirin alone and aggressive risk factor modification.  Patient advised not to drive till peripheral vision improves continue cardiac monitoring.  Check TEE tomorrow.  Discussed with Dr. [place] I spent 35  minutes in total face-to-face time with the patient, more than 50% of which was spent in counseling and coordination of care, reviewing test results, reviewing medication and discussing or reviewing the diagnosis of cryptogenic stroke and peripheral vision loss, the prognosis and treatment options.   Antony Contras, MD Medical Director [facility] Pager: [phone] 12/22/2021 1:34 PM

## 2021-12-22 NOTE — Progress Notes (Signed)
Physical Therapy Treatment Patient Details Name: Julie Robertson MRN: [id number] DOB: March 16, 1976 Today's Date: 12/22/2021   History of Present Illness Pt is a 46 y/o female admitted secondary to headache, wooziness and visual changes. Imaging revealed L occipital lobe stroke with extension along the posterior left watershed.and remote R corona radiata infarct. PMH includes DM and smoker.    PT Comments    Progressing well toward goals.  Emphasis on balance and visual challenges while conducting the DGI.  Pt using her own compensation where she looks ahead and sees what will be coming into the limited R field so she doesn't have to scan right so much.  We discussed why that method will likely not be enough scanning in an unfamiliar chaotic environment.    Recommendations for follow up therapy are one component of a multi-disciplinary discharge planning process, led by the attending physician.  Recommendations may be updated based on patient status, additional functional criteria and insurance authorization.  Follow Up Recommendations  Outpatient PT     Assistance Recommended at Discharge Frequent or constant Supervision/Assistance  Patient can return home with the following A little help with walking and/or transfers;A little help with bathing/dressing/bathroom;Assistance with cooking/housework;Assist for transportation;Help with stairs or ramp for entrance;Direct supervision/assist for medications management;Direct supervision/assist for financial management   Equipment Recommendations       Recommendations for Other Services       Precautions / Restrictions Precautions Precautions: Fall Precaution Comments: R field cut Restrictions Weight Bearing Restrictions: No     Mobility  Bed Mobility Overal bed mobility: Needs Assistance Bed Mobility: Supine to Sit, Sit to Supine     Supine to sit: Supervision Sit to supine: Supervision   General bed mobility comments: Min A  for trunk elevation to come to sitting.    Transfers Overall transfer level: Needs assistance Equipment used: 1 person hand held assist Transfers: Sit to/from Stand Sit to Stand: Supervision                Ambulation/Gait Ambulation/Gait assistance: Supervision Gait Distance (Feet): 500 Feet Assistive device: None Gait Pattern/deviations: Step-through pattern   Gait velocity interpretation: >2.62 ft/sec, indicative of community ambulatory   General Gait Details: steady and able to ambulate at age appropriate speeds.  pt still does not scan R nearly enough to realize all the environment on the R, but already is scanning ahead for cues, doors, halls  coming soon.  This does not always work when her focus is divided.   Stairs Stairs: Yes   Stair Management: No rails, Alternating pattern, Forwards Number of Stairs: 3 General stair comments: asked pt to use rails, but negotiated 3 without incident and no rails.   Wheelchair Mobility    Modified Rankin (Stroke Patients Only) Modified Rankin (Stroke Patients Only) Pre-Morbid Rankin Score: No symptoms Modified Rankin: Moderate disability     Balance Overall balance assessment: Needs assistance Sitting-balance support: No upper extremity supported, Feet supported Sitting balance-Leahy Scale: Good     Standing balance support: Single extremity supported, No upper extremity supported Standing balance-Leahy Scale: Good                   Standardized Balance Assessment Standardized Balance Assessment : Dynamic Gait Index   Dynamic Gait Index Level Surface: Normal Change in Gait Speed: Normal Gait with Horizontal Head Turns: Mild Impairment Gait with Vertical Head Turns: Normal Gait and Pivot Turn: Normal Step Over Obstacle: Normal Step Around Obstacles: Mild Impairment Steps: Normal Total Score: 22  Cognition Arousal/Alertness: Awake/alert Behavior During Therapy: WFL for tasks  assessed/performed Overall Cognitive Status: Within Functional Limits for tasks assessed                                          Exercises      General Comments General comments (skin integrity, edema, etc.): Long discussion about Scanning as an exercise/activity at home where she is in her familiar environment vs in unfamiliar environments where cues are not going to be available if pt doesn't learn to scan sufficiently.      Pertinent Vitals/Pain Pain Assessment Pain Assessment: Faces Faces Pain Scale: Hurts little more Pain Location: headache Pain Descriptors / Indicators: Headache Pain Intervention(s): Monitored during session    Home Living Family/patient expects to be discharged to:: Private residence Living Arrangements: Spouse/significant other;Children Available Help at Discharge: Family Type of Home: House Home Access: Stairs to enter Entrance Stairs-Rails: None Technical brewer of Steps: 2   Home Layout: One level Home Equipment: Conservation officer, nature (2 wheels)      Prior Function            PT Goals (current goals can now be found in the care plan section) Acute Rehab PT Goals PT Goal Formulation: With patient/family Time For Goal Achievement: 01/04/22 Potential to Achieve Goals: Good Progress towards PT goals: Progressing toward goals    Frequency    Min 4X/week      PT Plan Current plan remains appropriate    Co-evaluation              AM-PAC PT "6 Clicks" Mobility   Outcome Measure  Help needed turning from your back to your side while in a flat bed without using bedrails?: A Little Help needed moving from lying on your back to sitting on the side of a flat bed without using bedrails?: A Little Help needed moving to and from a bed to a chair (including a wheelchair)?: A Little Help needed standing up from a chair using your arms (e.g., wheelchair or bedside chair)?: A Little Help needed to walk in hospital room?: A  Little Help needed climbing 3-5 steps with a railing? : A Little 6 Click Score: 18    End of Session   Activity Tolerance: Patient tolerated treatment well Patient left: in bed;with call bell/phone within reach;with family/visitor present Nurse Communication: Mobility status PT Visit Diagnosis: Other abnormalities of gait and mobility (R26.89);Other symptoms and signs involving the nervous system (R29.898)     Time: 8768-1157 PT Time Calculation (min) (ACUTE ONLY): 27 min  Charges:  $Gait Training: 8-22 mins $Therapeutic Activity: 8-22 mins                     12/22/2021  Ginger Carne., PT Acute Rehabilitation Services [phone]  (pager) [phone]  (office)   Julie Robertson 12/22/2021, 6:29 PM

## 2021-12-22 NOTE — Progress Notes (Signed)
PROGRESS NOTE    Julie Robertson  KVQ:[id number] DOB: 1976-04-22 DOA: 12/20/2021 PCP: Kerin Perna, NP    Brief Narrative:   Julie Robertson is a 46 y.o. Spanish-speaking female with past medical history significant for poorly controlled type 2 diabetes mellitus, dyslipidemia, bile duct mass s/p resection, GERD, tobacco use disorder, morbid obesity who presented to [facility] ED on 6/19 with complaints of visual changes and unsteadiness on her feet.  Reports onset of symptoms 2 days prior.  She reports seeing lights in both eyes as described as being like a "disco ball".  Also reports symptoms of frontal headache and feeling dizzy.  Reports almost getting into a car accident due to her symptoms.  Denies chest pain, no palpitations, no fever/chills, no shortness of breath, no cough, no nausea/vomiting/diarrhea, no dysuria symptoms.  Patient reports not having a routine eye exam.  Her blood sugars have been in the 200s.  In the ED, patient is afebrile with mild tachypnea.  CT head with without contrast with no acute abnormality.  Hemoglobin 11.1, glucose 216, calcium 8.8.  MR brain significant patchy acute cortical infarct left occipital lobe.  Urine pregnancy screen negative.  Patient was given 1 L normal saline, Compazine, Toradol, Benadryl and Ativan.  Neurology was consulted.  TRH consulted for admission for acute CVA.  Assessment & Plan:   Acute occipital CVA Patient presenting with complaint of vision changes, headache and unsteady gait.  MRI significant patchy acute cortical infarct left occipital lobe with extension along the posterior left watershed region.  TTE with LVEF 60 to 65%, no regional wall motion abnormalities, mild concentric LVH, small pericardial effusion that is circumferential, no atrial level shunt detected by Doppler.  Ultrasound transcranial Doppler with findings of positive TCD bubble study with Valsalva only indicative of small right to left shunt.   Hemoglobin A1c 11.6, LDL 55.  Risk factors include hypertension, uncontrolled diabetes, tobacco abuse and obesity. --Neurology following, appreciate assistance --TEE pending for Friday --DAPT with aspirin 81 mg p.o. daily, Plavix 85 mg p.o. daily x 21 days followed by aspirin alone --No driving until vision improves, will need outpatient optometry/ophthalmology evaluation  Headache  Etiology likely related to acute occipital CVA with visual changes versus migraine --Tylenol as needed mild pain/headache --Toradol 50 mg IV q6h PRN moderate pain/headache --Imitrex PRN  Type 2 diabetes mellitus with hyperglycemia Hemoglobin A1c 11.6, poorly controlled.  Home regimen includes insulin glargine 60 units at pensively nightly, Humalog 12 units 3 times daily AC, metformin 1000 mg p.o. twice daily, glipizide 10 mg p.o. twice daily.  Etiology of poor control likely medication noncompliance coupled with dietary indiscretions. --Diabetic educator following, appreciate assistance --Semglee 30u [place] daily --NovoLog 12 units TIDAC --Moderate SSI for coverage --CBGs qAC/HS --Dietitian consult --Eye exam outpatient  Dyslipidemia Lipid panel with total cholesterol 120, HDL 20, LDL 55, triglycerides 224. --Tricor increased to 160 mg p.o. 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  Surgical pathology did not note high-grade dysplasia or malignancy.  Tobacco use disorder Patient continues to smoke 1-2 packs/day on average.  Counseled for need for complete cessation given acute stroke. --Nicotine patch  Morbid obesity Discussed with patient needs for aggressive lifestyle changes/weight loss as this complicates all facets of care.  Outpatient follow-up with PCP.  May benefit from bariatric evaluation outpatient; but would need to prove that she could lose some weight before they would consider any type of surgical  procedure.   DVT prophylaxis:  enoxaparin (LOVENOX) injection 40 mg Start: 12/21/21 1000    Code Status: Full Code Family Communication: Family present at bedside this morning  Disposition Plan:  Level of care: Telemetry Medical Status is: Inpatient Remains inpatient appropriate because: Pending TEE for Friday, needs further optimization of diabetic regimen    Consultants:  Neurology Cardiology for TEE  Procedures:  TTE  Antimicrobials:  None   Subjective: Patient seen and examined at bedside, resting comfortably.  Sitting at edge of bed.  Family present.  Assisted with interpretation by speech therapy, Julie Robertson.  Patient continues to complain of headache and visual changes.  Long next discussion at bedside regarding occipital stroke which correlates with her symptoms of headache, visual disturbance and gait abnormality.  Also discussed need for complete cessation from tobacco use in which she states will have a hard time with.  Also she reports as well as family that she mostly eats carbohydrates during the day and drinks soda with each meal.  Discussed with her diabetes, obesity, tobacco use these are significant risk factors for stroke and significant need for lifestyle changes in order to prevent further strokes in the past as well as other medical complications.  Patient states "is there a pill you can give me to make my stroke go away".  Discussed with her natural course following stroke with some patients returning to full capacity, some with partial capacity return in some with no significant change; and only time will tell.  No other questions or concerns at this time.  Denies fever/chills/night sweats, no nausea/vomiting/diarrhea, no chest pain, no palpitations, no shortness of breath, no abdominal pain, no weakness, no fatigue, no paresthesias.  No acute events overnight per nursing staff.  Objective: Vitals:   12/22/21 0020 12/22/21 0025 12/22/21 0406 12/22/21 1227  BP: (!) 101/34 113/65 113/62 112/68   Pulse: 91  77 74  Resp: 18  (!) 22 18  Temp: 98.7 F (37.1 C)  98.6 F (37 C) 98.2 F (36.8 C)  TempSrc: Oral  Oral Oral  SpO2: 96%  96% 96%    Intake/Output Summary (Last 24 hours) at 12/22/2021 1433 Last data filed at 12/22/2021 0825 Gross per 24 hour  Intake 120 ml  Output --  Net 120 ml   There were no vitals filed for this visit.  Examination:  Physical Exam: GEN: NAD, alert and oriented x 3, obese HEENT: NCAT, PERRL, EOMI, sclera clear, MMM PULM: CTAB w/o wheezes/crackles, normal respiratory effort, on room air CV: RRR w/o M/G/R GI: abd soft, NTND, NABS, no R/G/M MSK: no peripheral edema, muscle strength globally intact 5/5 bilateral upper/lower extremities NEURO: CN II-XII intact, no focal deficits, sensation to light touch intact PSYCH: normal mood/affect Integumentary: dry/intact, no rashes or wounds    Data Reviewed: I have personally reviewed following labs and imaging studies  CBC: Recent Labs  Lab 12/20/21 1616 12/22/21 0314  WBC 8.2 6.9  NEUTROABS 3.9  --   HGB 11.1* 9.5*  HCT 36.8 30.4*  MCV 74.6* 72.4*  PLT 361 976   Basic Metabolic Panel: Recent Labs  Lab 12/20/21 1616 12/22/21 0314  NA 136 136  K 3.8 4.0  CL 103 106  CO2 20* 23  GLUCOSE 216* 285*  BUN 16 16  CREATININE 0.81 0.92  CALCIUM 8.8* 8.6*   GFR: CrCl cannot be calculated (Unknown ideal weight.). Liver Function Tests: Recent Labs  Lab 12/22/21 0314  AST 37  ALT 33  ALKPHOS 56  BILITOT  0.4  PROT 5.9*  ALBUMIN 2.8*   No results for input(s): "LIPASE", "AMYLASE" in the last 168 hours. No results for input(s): "AMMONIA" in the last 168 hours. Coagulation Profile: No results for input(s): "INR", "PROTIME" in the last 168 hours. Cardiac Enzymes: No results for input(s): "CKTOTAL", "CKMB", "CKMBINDEX", "TROPONINI" in the last 168 hours. BNP (last 3 results) No results for input(s): "PROBNP" in the last 8760 hours. HbA1C: Recent Labs    12/21/21 0925  HGBA1C  11.6*   CBG: Recent Labs  Lab 12/21/21 1212 12/21/21 1631 12/22/21 0021 12/22/21 0607 12/22/21 1228  GLUCAP 259* 196* 372* 255* 289*   Lipid Profile: Recent Labs    12/21/21 0925  CHOL 120  HDL 20*  LDLCALC 55  TRIG 224*  CHOLHDL 6.0   Thyroid Function Tests: No results for input(s): "TSH", "T4TOTAL", "FREET4", "T3FREE", "THYROIDAB" in the last 72 hours. Anemia Panel: No results for input(s): "VITAMINB12", "FOLATE", "FERRITIN", "TIBC", "IRON", "RETICCTPCT" in the last 72 hours. Sepsis Labs: No results for input(s): "PROCALCITON", "LATICACIDVEN" in the last 168 hours.  No results found for this or any previous visit (from the past 240 hour(s)).       Radiology Studies: VAS [place] LOWER EXTREMITY VENOUS (DVT)  Result Date: 12/22/2021  Lower Venous DVT Study Patient Name:  Julie Robertson  Date of Exam:   12/22/2021 Medical Rec #: [id number]                Accession #:    [id number] Date of Birth: 17-Jan-1976                Patient Gender: F Patient Age:   1 years Exam Location:  [facility] Procedure:      VAS [place] LOWER EXTREMITY VENOUS (DVT) Referring Phys: Elwin Sleight DE LA TORRE --------------------------------------------------------------------------------  Indications: Stroke.  Comparison Study: No previous exam noted. Performing Technologist: Bobetta Lime BS, RVT  Examination Guidelines: A complete evaluation includes B-mode imaging, spectral Doppler, color Doppler, and power Doppler as needed of all accessible portions of each vessel. Bilateral testing is considered an integral part of a complete examination. Limited examinations for reoccurring indications may be performed as noted. The reflux portion of the exam is performed with the patient in reverse Trendelenburg.  +---------+---------------+---------+-----------+----------+--------------+ RIGHT    CompressibilityPhasicitySpontaneityPropertiesThrombus Aging  +---------+---------------+---------+-----------+----------+--------------+ CFV      Full           Yes      Yes                                 +---------+---------------+---------+-----------+----------+--------------+ SFJ      Full                                                        +---------+---------------+---------+-----------+----------+--------------+ FV Prox  Full                                                        +---------+---------------+---------+-----------+----------+--------------+ FV Mid   Full                                                        +---------+---------------+---------+-----------+----------+--------------+  FV DistalFull                                                        +---------+---------------+---------+-----------+----------+--------------+ PFV      Full                                                        +---------+---------------+---------+-----------+----------+--------------+ POP      Full           Yes      Yes                                 +---------+---------------+---------+-----------+----------+--------------+ PTV      Full                                                        +---------+---------------+---------+-----------+----------+--------------+ PERO     Full                                                        +---------+---------------+---------+-----------+----------+--------------+   +---------+---------------+---------+-----------+----------+--------------+ LEFT     CompressibilityPhasicitySpontaneityPropertiesThrombus Aging +---------+---------------+---------+-----------+----------+--------------+ CFV      Full           Yes      Yes                                 +---------+---------------+---------+-----------+----------+--------------+ SFJ      Full                                                         +---------+---------------+---------+-----------+----------+--------------+ FV Prox  Full                                                        +---------+---------------+---------+-----------+----------+--------------+ FV Mid   Full                                                        +---------+---------------+---------+-----------+----------+--------------+ FV DistalFull                                                        +---------+---------------+---------+-----------+----------+--------------+  PFV      Full                                                        +---------+---------------+---------+-----------+----------+--------------+ POP      Full           Yes      Yes                                 +---------+---------------+---------+-----------+----------+--------------+ PTV      Full                                                        +---------+---------------+---------+-----------+----------+--------------+ PERO     Full                                                        +---------+---------------+---------+-----------+----------+--------------+    Summary: BILATERAL: - No evidence of deep vein thrombosis seen in the lower extremities, bilaterally. -No evidence of popliteal cyst, bilaterally.   *See table(s) above for measurements and observations.    Preliminary    VAS [place] TRANSCRANIAL DOPPLER W BUBBLES  Result Date: 12/21/2021  Transcranial Doppler with Bubble Patient Name:  Julie Robertson  Date of Exam:   12/21/2021 Medical Rec #: [id number]                Accession #:    [id number] Date of Birth: [birth year]                Patient Gender: F Patient Age:   79 years Exam Location:  [facility] Procedure:      VAS [place] TRANSCRANIAL DOPPLER W BUBBLES Referring Phys: Parke Poisson --------------------------------------------------------------------------------  Indications: Stroke. Comparison Study: No prior studies.  Performing Technologist: Darlin Coco RDMS, RVT  Examination Guidelines: A complete evaluation includes B-mode imaging, spectral Doppler, color Doppler, and power Doppler as needed of all accessible portions of each vessel. Bilateral testing is considered an integral part of a complete examination. Limited examinations for reoccurring indications may be performed as noted.  Summary:  A vascular evaluation was performed. The left middle cerebral artery was studied. An IV was inserted into the patient's right forearm. Verbal informed consent was obtained.  Less than 10 HITS (high intensity transient signals) were observed at rest, and between 10 and 15 HITS were observed with valsalva indicating a Spencer Grade 2 patent foramen ovale (PFO) with valsalva. Positive TCD Bubble study with valasalva only indicative of a small right to left shunt *See table(s) above for TCD measurements and observations.  Diagnosing physician: Antony Contras MD Electronically signed by Antony Contras MD on 12/21/2021 at 1:13:17 PM.    Final    ECHOCARDIOGRAM COMPLETE  Result Date: 12/21/2021    ECHOCARDIOGRAM REPORT   Patient Name:   Julie Robertson Date of Exam: 12/21/2021 Medical Rec #:  [id number]  Height:       65.0 in Accession #:    [id number]              Weight:       260.6 lb Date of Birth:  11/17/1975               BSA:          2.214 m Patient Age:    39 years                BP:           109/76 mmHg Patient Gender: F                       HR:           68 bpm. Exam Location:  Inpatient Procedure: 2D Echo, Cardiac Doppler and Color Doppler Indications:    Stroke  History:        Patient has no prior history of Echocardiogram examinations.                 Risk Factors:Current Smoker, Diabetes and Hypertension.  Sonographer:    Jyl Heinz Referring Phys: [id number] RONDELL A SMITH IMPRESSIONS  1. Left ventricular ejection fraction, by estimation, is 60 to 65%. The left ventricle has normal function. The left  ventricle has no regional wall motion abnormalities. There is mild concentric left ventricular hypertrophy. Left ventricular diastolic parameters were normal.  2. Right ventricular systolic function is normal. The right ventricular size is normal. Tricuspid regurgitation signal is inadequate for assessing PA pressure.  3. A small pericardial effusion is present. The pericardial effusion is circumferential.  4. The mitral valve is normal in structure. No evidence of mitral valve regurgitation. No evidence of mitral stenosis.  5. The aortic valve is tricuspid. There is mild thickening of the aortic valve. Aortic valve regurgitation is not visualized. No aortic stenosis is present. Comparison(s): No prior Echocardiogram. FINDINGS  Left Ventricle: Left ventricular ejection fraction, by estimation, is 60 to 65%. The left ventricle has normal function. The left ventricle has no regional wall motion abnormalities. The left ventricular internal cavity size was normal in size. There is  mild concentric left ventricular hypertrophy. Left ventricular diastolic parameters were normal. Right Ventricle: The right ventricular size is normal. No increase in right ventricular wall thickness. Right ventricular systolic function is normal. Tricuspid regurgitation signal is inadequate for assessing PA pressure. Left Atrium: Left atrial size was normal in size. Right Atrium: Right atrial size was normal in size. Pericardium: A small pericardial effusion is present. The pericardial effusion is circumferential. Mitral Valve: The mitral valve is normal in structure. No evidence of mitral valve regurgitation. No evidence of mitral valve stenosis. Tricuspid Valve: The tricuspid valve is normal in structure. Tricuspid valve regurgitation is not demonstrated. No evidence of tricuspid stenosis. Aortic Valve: The aortic valve is tricuspid. There is mild thickening of the aortic valve. Aortic valve regurgitation is not visualized. No aortic  stenosis is present. Aortic valve peak gradient measures 9.7 mmHg. Pulmonic Valve: The pulmonic valve was normal in structure. Pulmonic valve regurgitation is trivial. No evidence of pulmonic stenosis. Aorta: The aortic root and ascending aorta are structurally normal, with no evidence of dilitation. IAS/Shunts: No atrial level shunt detected by color flow Doppler.  LEFT VENTRICLE PLAX 2D LVIDd:         4.90 cm      Diastology LVIDs:  3.00 cm      LV e' medial:    8.27 cm/s LV PW:         1.20 cm      LV E/e' medial:  8.1 LV IVS:        1.10 cm      LV e' lateral:   10.70 cm/s LVOT diam:     2.00 cm      LV E/e' lateral: 6.3 LV SV:         65 LV SV Index:   30 LVOT Area:     3.14 cm  LV Volumes (MOD) LV vol d, MOD A2C: 90.4 ml LV vol d, MOD A4C: 100.0 ml LV vol s, MOD A2C: 35.1 ml LV vol s, MOD A4C: 36.3 ml LV SV MOD A2C:     55.3 ml LV SV MOD A4C:     100.0 ml LV SV MOD BP:      60.7 ml RIGHT VENTRICLE             IVC RV Basal diam:  3.10 cm     IVC diam: 1.20 cm RV Mid diam:    2.60 cm RV S prime:     10.30 cm/s TAPSE (M-mode): 2.0 cm LEFT ATRIUM             Index        RIGHT ATRIUM           Index LA diam:        3.80 cm 1.72 cm/m   RA Area:     13.20 cm LA Vol (A2C):   41.5 ml 18.75 ml/m  RA Volume:   29.00 ml  13.10 ml/m LA Vol (A4C):   33.3 ml 15.04 ml/m LA Biplane Vol: 37.2 ml 16.80 ml/m  AORTIC VALVE AV Area (Vmax): 1.95 cm AV Vmax:        156.00 cm/s AV Peak Grad:   9.7 mmHg LVOT Vmax:      96.90 cm/s LVOT Vmean:     67.900 cm/s LVOT VTI:       0.208 m  AORTA Ao Root diam: 3.30 cm Ao Asc diam:  3.30 cm MITRAL VALVE MV Area (PHT): 3.27 cm    SHUNTS MV Decel Time: 232 msec    Systemic VTI:  0.21 m MV E velocity: 67.30 cm/s  Systemic Diam: 2.00 cm MV A velocity: 53.80 cm/s MV E/A ratio:  1.25 Rudean Haskell MD Electronically signed by Rudean Haskell MD Signature Date/Time: 12/21/2021/10:54:19 AM    Final    CT ANGIO HEAD NECK W WO CM (CODE STROKE)  Result Date:  12/21/2021 CLINICAL DATA:  Stroke follow-up EXAM: CT ANGIOGRAPHY HEAD AND NECK TECHNIQUE: Multidetector CT imaging of the head and neck was performed using the standard protocol during bolus administration of intravenous contrast. Multiplanar CT image reconstructions and MIPs were obtained to evaluate the vascular anatomy. Carotid stenosis measurements (when applicable) are obtained utilizing NASCET criteria, using the distal internal carotid diameter as the denominator. RADIATION DOSE REDUCTION: This exam was performed according to the departmental dose-optimization program which includes automated exposure control, adjustment of the mA and/or kV according to patient size and/or use of iterative reconstruction technique. CONTRAST:  18m OMNIPAQUE IOHEXOL 350 MG/ML SOLN COMPARISON:  Brain MRI from earlier today FINDINGS: CT HEAD FINDINGS Brain: Acute ischemia is occult by CT. Remote perforator infarct at the right basal ganglia. Coarse calcification at the left putamen and left occipital cortex. No hemorrhage, hydrocephalus, or collection Vascular:  See below Skull: Negative Sinuses: Negative Orbits: Negative Review of the MIP images confirms the above findings CTA NECK FINDINGS Aortic arch: Unremarkable Right carotid system: Mild plaque at the bifurcation. No stenosis or ulceration. Left carotid system: Mild atheromatous plaque at the bifurcation. No stenosis or ulceration Vertebral arteries: Proximal subclavian and vertebral arteries are smoothly contoured and widely patent. Skeleton: Negative Other neck: No evidence of mass or inflammation. Upper chest: Mosaic attenuation of the lungs usually from small airways disease Review of the MIP images confirms the above findings CTA HEAD FINDINGS Anterior circulation: Mild atheromatous calcification of the carotid siphons. No branch occlusion, beading, or flow limiting stenosis. Posterior circulation: The vertebral and basilar arteries are smoothly contoured and widely  patent. No branch occlusion, beading, or flow limiting stenosis. Venous sinuses: Diffusely patent Anatomic variants: None significant Review of the MIP images confirms the above findings IMPRESSION: 1. No emergent vascular finding. 2. Atherosclerosis without flow limiting stenosis of major vessels. 3. Left basal ganglia and left occipital cortex calcifications, are there risk factors for remote neurocysticercosis? Electronically Signed   By: Jorje Guild M.D.   On: 12/21/2021 07:56   MR Brain W and Wo Contrast  Result Date: 12/21/2021 CLINICAL DATA:  Double vision with headache and vision changes. Assess for multiple sclerosis/optic neuritis EXAM: MRI HEAD WITHOUT AND WITH CONTRAST MRI CERVICAL SPINE WITHOUT AND WITH CONTRAST TECHNIQUE: Multiplanar, multiecho pulse sequences of the brain and surrounding structures, and cervical spine, to include the craniocervical junction and cervicothoracic junction, were obtained without and with intravenous contrast. CONTRAST:  68m GADAVIST GADOBUTROL 1 MMOL/ML IV SOLN COMPARISON:  Head CT from yesterday FINDINGS: MRI HEAD FINDINGS Brain: Patchy restricted diffusion along the cortex of the left occipital lobe and parietal lobe with milder patchy involvement of the high left frontal lobe. There is associated accentuated enhancement along the surface of the brain, likely luxury perfusion or pial collaterals. Wedge of T2 hyperintensity with volume loss at the right corona radiata. No specific demyelinating pattern. No mass, hydrocephalus, or collection. Vascular: Major flow voids and vascular enhancements are preserved. Skull and upper cervical spine: Normal marrow signal Sinuses/Orbits: Negative MRI CERVICAL SPINE FINDINGS Alignment: Reversal of cervical lordosis which is likely positional Vertebrae: No fracture, evidence of discitis, or bone lesion. Cord: Normal signal and morphology. Posterior Fossa, vertebral arteries, paraspinal tissues: Negative for perispinal mass or  inflammation Disc levels: C4-5: Disc bulging with left paracentral to foraminal protrusion. Mild spinal stenosis. Patent foramina C5-6: Sizable right paracentral to foraminal protrusion impinging on the right ventral cord and causing prominent right foraminal impingement. The herniation is likely chronic given evidence of buttressing osteophyte. Motion artifact IMPRESSION: Brain MRI: 1. Patchy acute cortical infarcts in the left occipital lobe with extension along the posterior left watershed. 2. Remote perforator infarct the right corona radiata. Cervical spine: 1. Normal motion degraded appearance of the cord. 2. C5-6 large right paracentral to foraminal protrusion encroaching on the right cord and causing right foraminal impingement. Electronically Signed   By: JJorje GuildM.D.   On: 12/21/2021 05:55   MR Cervical Spine W or Wo Contrast  Result Date: 12/21/2021 CLINICAL DATA:  Double vision with headache and vision changes. Assess for multiple sclerosis/optic neuritis EXAM: MRI HEAD WITHOUT AND WITH CONTRAST MRI CERVICAL SPINE WITHOUT AND WITH CONTRAST TECHNIQUE: Multiplanar, multiecho pulse sequences of the brain and surrounding structures, and cervical spine, to include the craniocervical junction and cervicothoracic junction, were obtained without and with intravenous contrast. CONTRAST:  186mGADAVIST GADOBUTROL  1 MMOL/ML IV SOLN COMPARISON:  Head CT from yesterday FINDINGS: MRI HEAD FINDINGS Brain: Patchy restricted diffusion along the cortex of the left occipital lobe and parietal lobe with milder patchy involvement of the high left frontal lobe. There is associated accentuated enhancement along the surface of the brain, likely luxury perfusion or pial collaterals. Wedge of T2 hyperintensity with volume loss at the right corona radiata. No specific demyelinating pattern. No mass, hydrocephalus, or collection. Vascular: Major flow voids and vascular enhancements are preserved. Skull and upper  cervical spine: Normal marrow signal Sinuses/Orbits: Negative MRI CERVICAL SPINE FINDINGS Alignment: Reversal of cervical lordosis which is likely positional Vertebrae: No fracture, evidence of discitis, or bone lesion. Cord: Normal signal and morphology. Posterior Fossa, vertebral arteries, paraspinal tissues: Negative for perispinal mass or inflammation Disc levels: C4-5: Disc bulging with left paracentral to foraminal protrusion. Mild spinal stenosis. Patent foramina C5-6: Sizable right paracentral to foraminal protrusion impinging on the right ventral cord and causing prominent right foraminal impingement. The herniation is likely chronic given evidence of buttressing osteophyte. Motion artifact IMPRESSION: Brain MRI: 1. Patchy acute cortical infarcts in the left occipital lobe with extension along the posterior left watershed. 2. Remote perforator infarct the right corona radiata. Cervical spine: 1. Normal motion degraded appearance of the cord. 2. C5-6 large right paracentral to foraminal protrusion encroaching on the right cord and causing right foraminal impingement. Electronically Signed   By: Jorje Guild M.D.   On: 12/21/2021 05:55   MR ORBITS W WO CONTRAST  Result Date: 12/21/2021 CLINICAL DATA:  Blurry vision and headache EXAM: MRI OF THE ORBITS WITHOUT AND WITH CONTRAST TECHNIQUE: Multiplanar, multi-echo pulse sequences of the orbits and surrounding structures were acquired including fat saturation techniques, before and after intravenous contrast administration. CONTRAST:  68m GADAVIST GADOBUTROL 1 MMOL/ML IV SOLN COMPARISON:  Head CT from yesterday FINDINGS: Orbits: Limited by motion artifact. No evidence of orbital inflammation or mass. No evidence of optic neuritis. The orbital fat, lacrimal glands, extraocular muscles, and globes are unremarkable. Visualized sinuses: Negative Soft tissues: Negative Limited intracranial: Reported separately IMPRESSION: Negative motion degraded MRI of the  orbits. Electronically Signed   By: JJorje GuildM.D.   On: 12/21/2021 05:47   CT HEAD WO CONTRAST (5MM)  Result Date: 12/20/2021 CLINICAL DATA:  Diplopia EXAM: CT HEAD WITHOUT CONTRAST TECHNIQUE: Contiguous axial images were obtained from the base of the skull through the vertex without intravenous contrast. RADIATION DOSE REDUCTION: This exam was performed according to the departmental dose-optimization program which includes automated exposure control, adjustment of the mA and/or kV according to patient size and/or use of iterative reconstruction technique. COMPARISON:  CT head 11/08/2021 FINDINGS: Brain: No acute intracranial hemorrhage, mass effect, or herniation. No extra-axial fluid collections. No evidence of acute territorial infarct. No hydrocephalus. Stable small patchy hypodensities in the right basal ganglia and corona radiata. Chronic punctate calcification in the left basal ganglia. Vascular: No hyperdense vessel or unexpected calcification. Skull: Normal. Negative for fracture or focal lesion. Sinuses/Orbits: No acute finding. Other: None. IMPRESSION: Chronic changes with no acute intracranial process identified. Electronically Signed   By: DOfilia NeasM.D.   On: 12/20/2021 17:49        Scheduled Meds:  aspirin EC  81 mg Oral Daily   clopidogrel  75 mg Oral Daily   enoxaparin (LOVENOX) injection  40 mg Subcutaneous Q24H   fenofibrate  160 mg Oral Daily   gabapentin  300 mg Oral TID   insulin aspart  0-5  Units Subcutaneous QHS   insulin aspart  0-9 Units Subcutaneous TID WC   insulin aspart  12 Units Subcutaneous TID WC   insulin glargine-yfgn  30 Units Subcutaneous Daily   nicotine  21 mg Transdermal Daily   pantoprazole  40 mg Oral Daily   [START ON 12/23/2021] pneumococcal 20-valent conjugate vaccine  0.5 mL Intramuscular Tomorrow-1000   Continuous Infusions:   LOS: 1 day    Time spent: 55 minutes spent on chart review, discussion with nursing staff,  consultants, updating family and interview/physical exam; more than 50% of that time was spent in counseling and/or coordination of care.    Adela Esteban J [place] Julie Robertson [facility] Available via Epic secure chat 7am-7pm After these hours, please refer to coverage provider listed on [email or web address] 12/22/2021, 2:33 PM

## 2021-12-22 NOTE — TOC Initial Note (Addendum)
Transition of Care [facility]) - Initial/Assessment Note    Patient Details  Name: Julie Robertson MRN: [id number] Date of Birth: 17-Dec-1975  Transition of [facility]) CM/SW Contact:    Pollie Friar, RN Phone Number: 12/22/2021, 2:00 PM  Clinical Narrative:                 Patient lives with spouse. Her plan is stay with her daughter at discharge in [place] so she will have more supervision and assistance.  Daughter drives and can get her to her appointments.  Pt asking to have her clinic changed from [facility] to [facility] is possible. CM has left a voicemail for the TCC person at [facility].  Pt uses [facility] for her medications and denies any issues.  Outpatient rehab to be arranged through one of the [facility] Out[facility] so pt can receive assistance with the cost.  Per pt request information sent to financial counseling to see about financial assistance.  ToC following.  Expected Discharge Plan: OP Rehab Barriers to Discharge: Inadequate or no insurance, Continued Medical Work up   Patient Goals and CMS Choice     Choice offered to / list presented to : Patient  Expected Discharge Plan and Services Expected Discharge Plan: OP Rehab   Discharge Planning Services: CM Consult   Living arrangements for the past 2 months: Single Family Home                                      Prior Living Arrangements/Services Living arrangements for the past 2 months: Single Family Home Lives with:: Spouse Patient language and need for interpreter reviewed:: Yes Do you feel safe going back to the place where you live?: Yes            Criminal Activity/Legal Involvement Pertinent to Current Situation/Hospitalization: No - Comment as needed  Activities of Daily Living Home Assistive Devices/Equipment: Eyeglasses ADL Screening (condition at time of admission) Patient's cognitive ability adequate to safely complete daily activities?: Yes Is the patient deaf or have difficulty  hearing?: No Does the patient have difficulty seeing, even when wearing glasses/contacts?: No Does the patient have difficulty concentrating, remembering, or making decisions?: No Patient able to express need for assistance with ADLs?: Yes Does the patient have difficulty dressing or bathing?: No Independently performs ADLs?: Yes (appropriate for developmental age) Does the patient have difficulty walking or climbing stairs?: No Weakness of Legs: None Weakness of Arms/Hands: None  Permission Sought/Granted                  Emotional Assessment Appearance:: Appears stated age Attitude/Demeanor/Rapport: Engaged Affect (typically observed): Accepting Orientation: : Oriented to Self, Oriented to Place, Oriented to  Time, Oriented to Situation   Psych Involvement: No (comment)  Admission diagnosis:  CVA (cerebral vascular accident) ([place]) [I63.9] Acute ischemic stroke ([place]) [I63.9] History of diabetes mellitus [Z86.39] Cerebrovascular accident (CVA), unspecified mechanism ([place]) [I63.9] Patient Active Problem List   Diagnosis Date Noted   Acute ischemic stroke ([place]) 12/21/2021   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   Acute pulmonary edema ([place]) 09/06/2020   Incisional hernia, without obstruction or gangrene 05/09/2019   Uncontrolled type 2 diabetes mellitus with hyperglycemia, with long-term current use of insulin ([place]) 04/12/2017   Essential hypertension 04/12/2017   Morbid (severe) obesity due to excess calories ([place]) 10/18/2016   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 [place] [address] Phone: [phone] Fax: [facility] [place]), [place] - [address] [place] ([place]) [place] [address] Phone: [phone] Fax: [phone]     Social Determinants of Health (SDOH) Interventions    Readmission Risk Interventions     No data to display
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
## 2021-12-22 NOTE — Progress Notes (Signed)
Bilateral LE venous duplex study completed. Please see CV Proc for preliminary results.  Maxamilian Amadon BS, RVT 12/22/2021 10:55 AM

## 2021-12-22 NOTE — Evaluation (Signed)
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   Acute pulmonary edema ([place]) 09/06/2020   Incisional hernia, without obstruction or gangrene 05/09/2019   Uncontrolled type 2 diabetes mellitus with hyperglycemia, with long-term current use of insulin ([place]) 04/12/2017   Essential hypertension 04/12/2017   Morbid (severe) obesity due to excess calories ([place]) 10/18/2016   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  numbness tingling in right fingers. rx with Gabapentin.   Dyslipidemia 08/2009   Dyspnea    Gall stones 08/2009   GERD (gastroesophageal reflux disease)    Obesity    BMI 41, 250# 03/2015   Past Surgical History:  Past Surgical History:  Procedure Laterality Date   APPLICATION OF WOUND VAC N/A 05/09/2019   Procedure: APPLICATION OF WOUND VAC;  Surgeon: Olean Ree, MD;  Location: [facility];  Service: General;  Laterality: N/A;  [id number]   CHOLECYSTECTOMY N/A 03/10/2015   Procedure: LAPAROSCOPIC CHOLECYSTECTOMY WITH INTRAOPERATIVE CHOLANGIOGRAM;  Surgeon: Georganna Skeans, MD;  Location: [facility];  Service: General;  Laterality: N/A;   ERCP N/A 03/11/2015   Procedure: ENDOSCOPIC  RETROGRADE CHOLANGIOPANCREATOGRAPHY (ERCP);  Surgeon: Milus Banister, MD;  Location: [facility];  Service: Endoscopy;  Laterality: N/A;   ESOPHAGOGASTRODUODENOSCOPY (EGD) WITH PROPOFOL N/A 07/25/2018   Procedure: ESOPHAGOGASTRODUODENOSCOPY (EGD) WITH PROPOFOL;  Surgeon: Rush Landmark Telford Nab., MD;  Location: [facility];  Service: Gastroenterology;  Laterality: N/A;   INSERTION OF MESH N/A 05/09/2019   Procedure: INSERTION OF MESH;  Surgeon: Olean Ree, MD;  Location: [facility];  Service: General;  Laterality: N/A;   Transduodenal Ampullectomy  09/2015   TUBAL LIGATION     VENTRAL HERNIA REPAIR N/A 05/09/2019   Procedure: HERNIA REPAIR VENTRAL ADULT with MESH;  Surgeon: Olean Ree, MD;  Location: [facility];  Service: General;  Laterality: N/A;   HPI:  Julie Robertson is a 46 y.o. female who presented to [facility] ED with headache since 12/19/21 at noon along with right blurred vision and feeling off balance.  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   Assessment / Plan / Recommendation Clinical Impression  Pt demonstrates adequate cognition, speech and language. Primary impairment is visual perceptual change and headache. No SLP f/u needed will sign off.    SLP Assessment  SLP Recommendation/Assessment: Patient does not need any further Speech Lanaguage Pathology Services    Recommendations for follow up therapy are one component of a multi-disciplinary discharge planning process, led by the attending physician.  Recommendations may be updated  based on patient status, additional functional criteria and insurance authorization.    Follow Up Recommendations       Assistance Recommended at Discharge     Functional Status  Assessment    Frequency and Duration           SLP Evaluation Cognition  Overall Cognitive Status: Within Functional Limits for tasks assessed       Comprehension  Auditory Comprehension Overall Auditory Comprehension: Appears within functional limits for tasks assessed    Expression Verbal Expression Overall Verbal Expression: Appears within functional limits for tasks assessed   Oral / Motor  Oral Motor/Sensory Function Overall Oral Motor/Sensory Function: Within functional limits Motor Speech Overall Motor Speech: Appears within functional limits for tasks assessed            Julie Robertson, Katherene Ponto 12/22/2021, 10:30 AM

## 2021-12-23 ENCOUNTER — Inpatient Hospital Stay (HOSPITAL_COMMUNITY): Payer: Self-pay

## 2021-12-23 ENCOUNTER — Telehealth: Payer: Self-pay | Admitting: *Deleted

## 2021-12-23 ENCOUNTER — Other Ambulatory Visit: Payer: Self-pay | Admitting: Physician Assistant

## 2021-12-23 DIAGNOSIS — I639 Cerebral infarction, unspecified: Secondary | ICD-10-CM

## 2021-12-23 LAB — GLUCOSE, CAPILLARY
Glucose-Capillary: 221 mg/dL — ABNORMAL HIGH (ref 70–99)
Glucose-Capillary: 252 mg/dL — ABNORMAL HIGH (ref 70–99)
Glucose-Capillary: 296 mg/dL — ABNORMAL HIGH (ref 70–99)
Glucose-Capillary: 285 mg/dL — ABNORMAL HIGH (ref 70–99)

## 2021-12-23 IMAGING — CT CT HEAD W/O CM
3 series · 14 of 47 positions shown, 16 images · non-contrast
Comparison: Head CT [DATE] and brain MRI [DATE]

CLINICAL DATA: Stroke follow-up



[Series 3: head 5.0 h30s · axial · 0.43mm/px · z∈[-93,+42]mm · 8 of 33 slices shown, 10 images]
[im 3/33  brain]
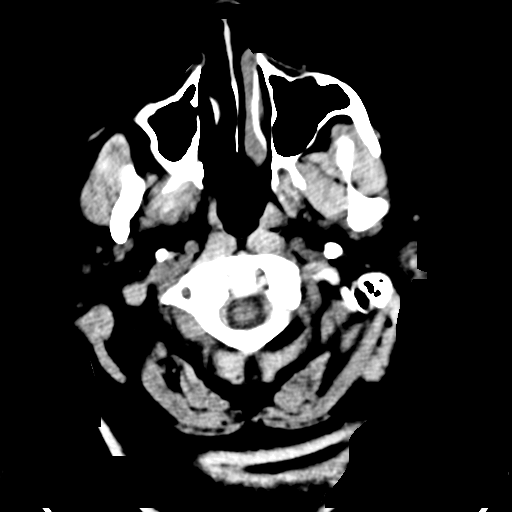
[im 3/33  bone]
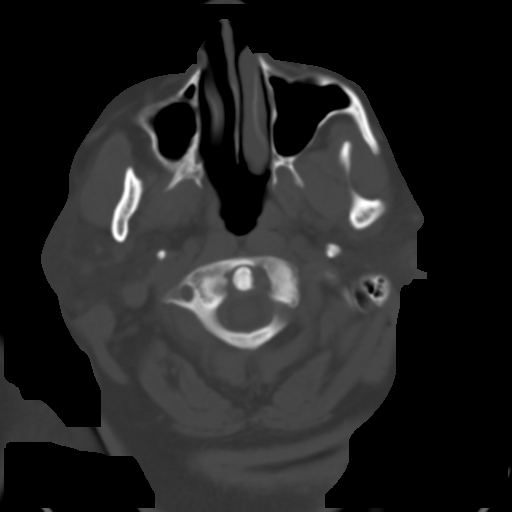
[im 7/33  brain]
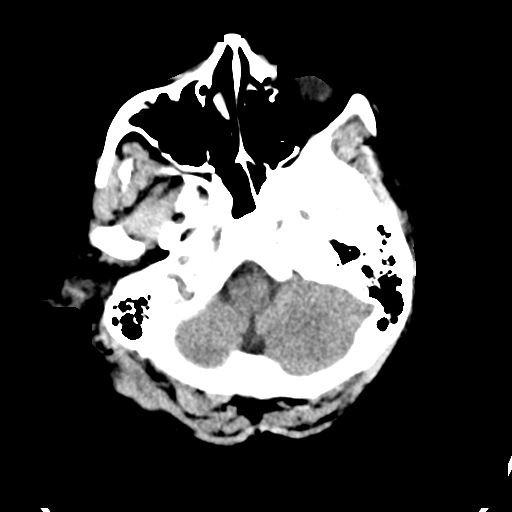
[im 10/33  brain]
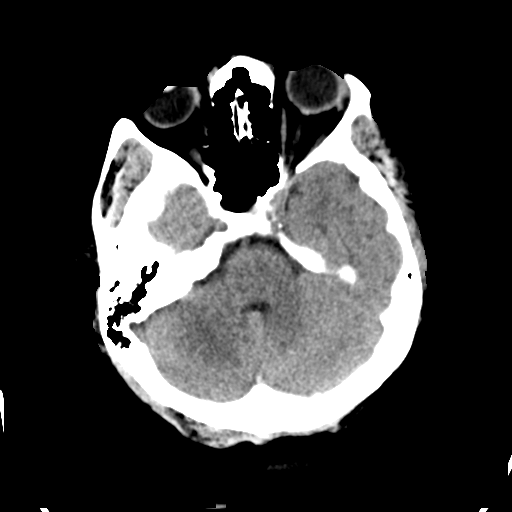
[im 15/33  brain]
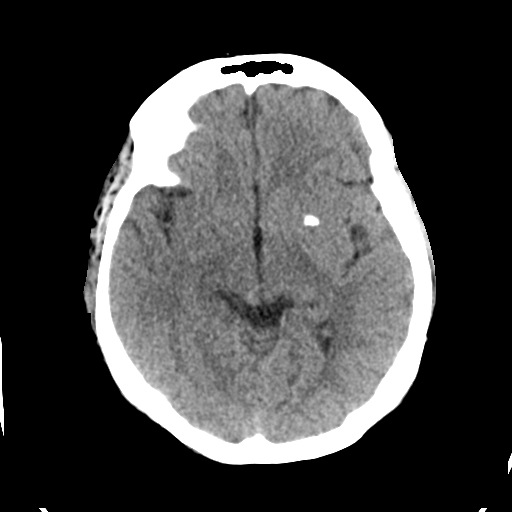
[im 18/33  brain]
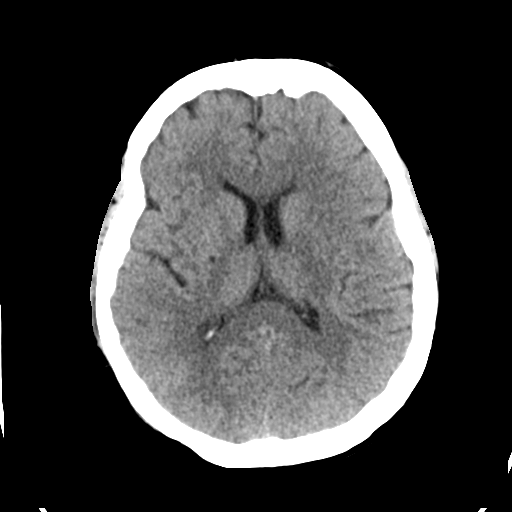
[im 18/33  bone]
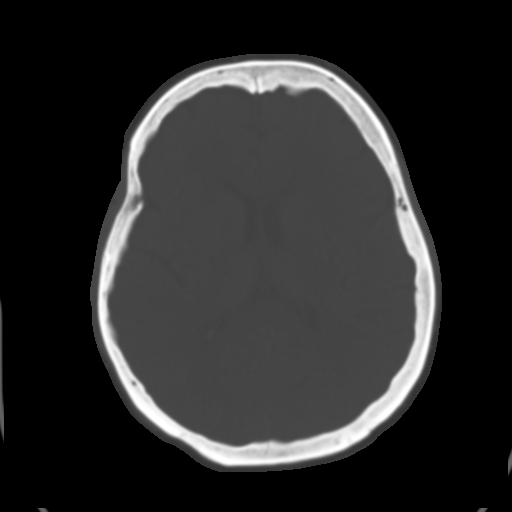
[im 23/33  brain]
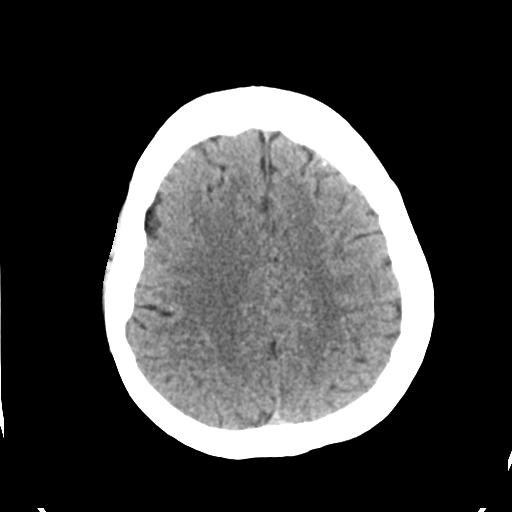
[im 26/33  brain]
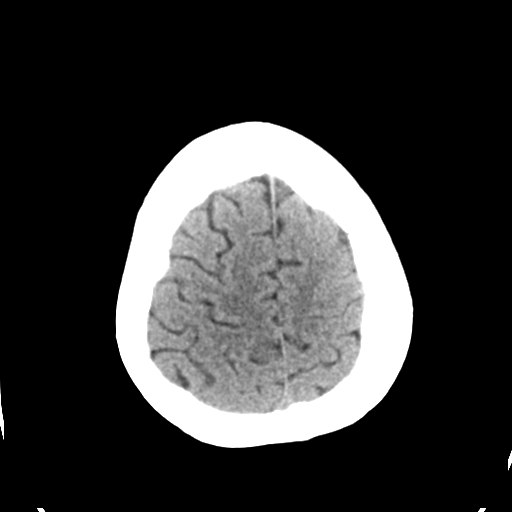
[im 30/33  brain]
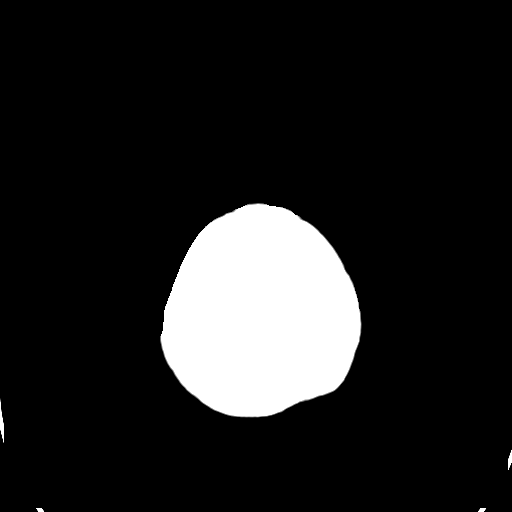

[Series 5: head 3.0 mpr cor · coronal · 0.32mm/px · 3 of 67 slices shown]
[im 23/67  brain]
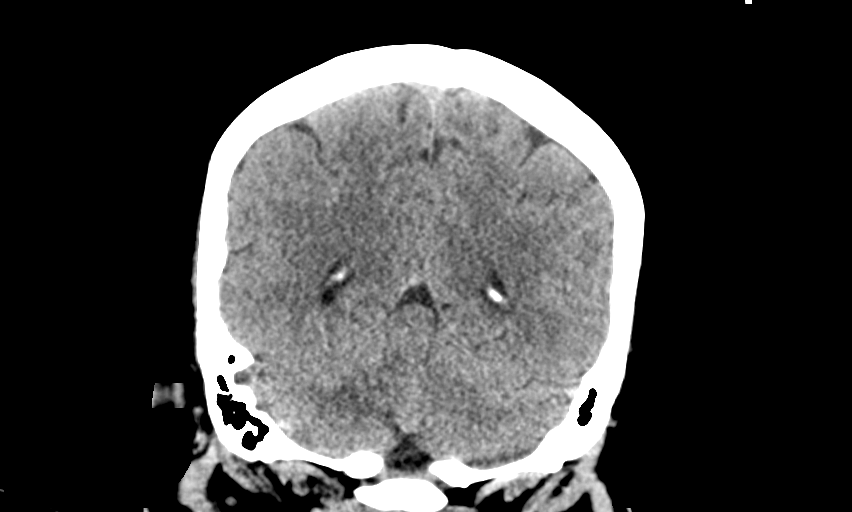
[im 30/67  brain]
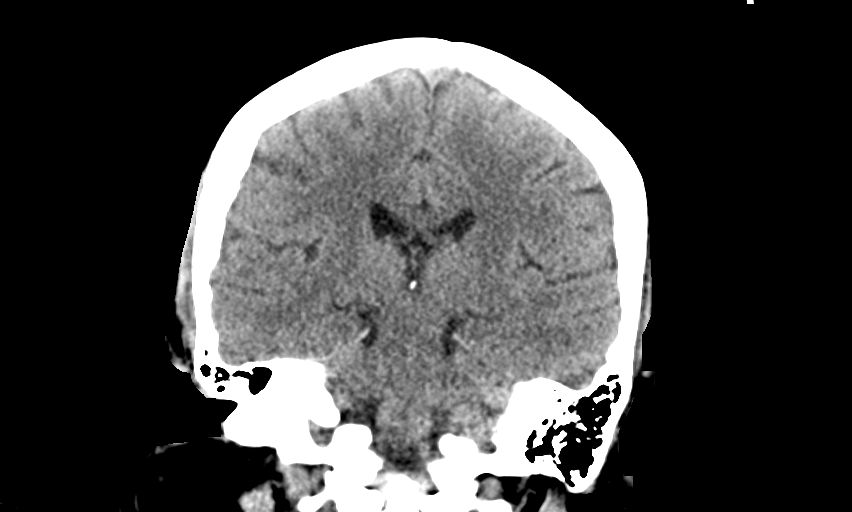
[im 37/67  brain]
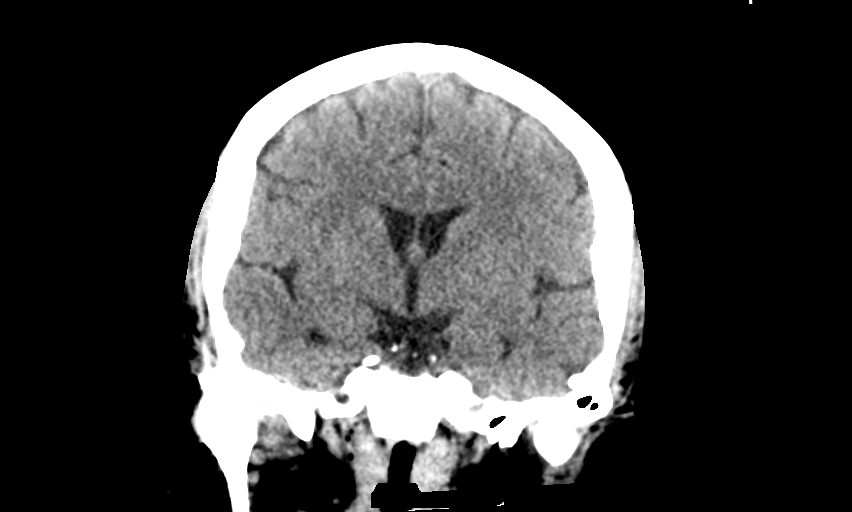

[Series 6: head 3.0 mpr sag · sagittal · 0.32mm/px · 3 of 67 slices shown]
[im 23/67  brain]
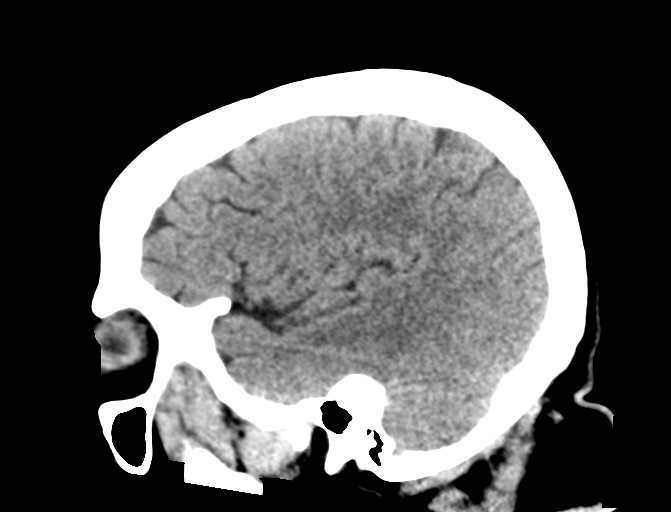
[im 34/67  brain]
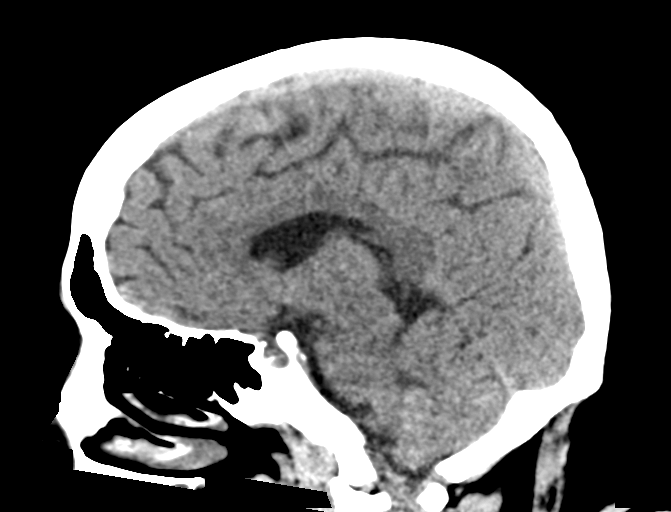
[im 45/67  brain]
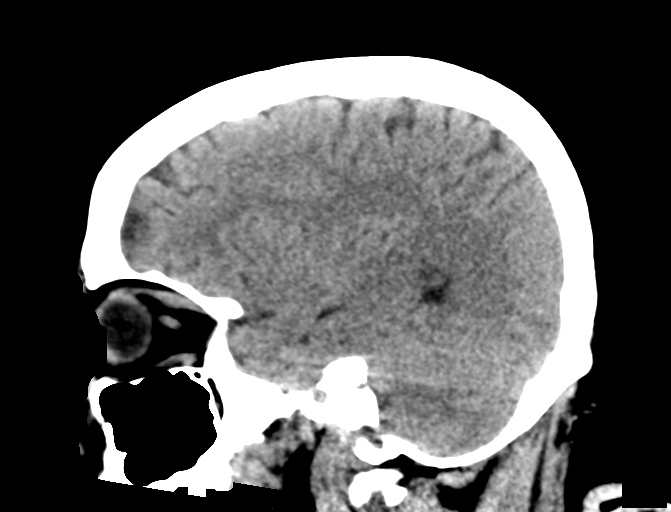

[14 of 47 positions shown; findings below may reference images not displayed]

FINDINGS: Brain: There is no mass, hemorrhage or extra-axial collection. The
size and configuration of the ventricles and extra-axial CSF spaces
are normal. There is hypoattenuation of the white matter, most
commonly indicating chronic small vessel disease. Patchy eye peau
attenuation in the left occipital lobe is compatible with known area
of infarction.

Vascular: No abnormal hyperdensity of the major intracranial
arteries or dural venous sinuses. No intracranial atherosclerosis.

Skull: The visualized skull base, calvarium and extracranial soft
tissues are normal.

Sinuses/Orbits: No fluid levels or advanced mucosal thickening of
the visualized paranasal sinuses. No mastoid or middle ear effusion.
The orbits are normal.
IMPRESSION: 1. No acute intracranial hemorrhage.
2. Patchy left occipital lobe peau attenuation, compatible with
known area of infarction.

## 2021-12-23 MED ORDER — LACTULOSE 10 GM/15ML PO SOLN
30.0000 g | Freq: Once | ORAL | Status: AC
  Administered 2021-12-23: 30 g via ORAL
  Filled 2021-12-23: qty 60

## 2021-12-23 MED ORDER — INSULIN GLARGINE-YFGN 100 UNIT/ML ~~LOC~~ SOLN
45.0000 [IU] | Freq: Every day | SUBCUTANEOUS | Status: DC
  Administered 2021-12-24: 45 [IU] via SUBCUTANEOUS
  Filled 2021-12-23 (×2): qty 0.45

## 2021-12-23 MED ORDER — BISACODYL 5 MG PO TBEC
10.0000 mg | DELAYED_RELEASE_TABLET | Freq: Once | ORAL | Status: AC
Start: 2021-12-23 — End: 2021-12-23
  Administered 2021-12-23: 10 mg via ORAL
  Filled 2021-12-23: qty 2

## 2021-12-23 NOTE — Progress Notes (Signed)
Occupational Therapy Treatment Patient Details Name: Julie Robertson MRN: [id number] DOB: 1975/09/23 Today's Date: 12/23/2021   History of present illness Pt is a 46 y/o female admitted secondary to headache, wooziness and visual changes. Imaging revealed L occipital lobe stroke with extension along the posterior left watershed.and remote R corona radiata infarct. PMH includes DM and smoker.   OT comments  Son used to interpret during session per his request, which pt was agreeable to. Focus of session on education on compensatory strategies for low vision to increase independence with ADL/IADL tasks and functional mobility and reduce risk of falls. Pt continues to complain of bright lights "shooting in her vision" at times. Significantly impaired peripheral and central vision. Pt tearful at times during session. Pt concerned about the financial aspect of going to the eye doctor,etc -given information on [facility], who may be able to help with eye care medical services in addition to home assessment. Continue to recommend outpt OT.     Recommendations for follow up therapy are one component of a multi-disciplinary discharge planning process, led by the attending physician.  Recommendations may be updated based on patient status, additional functional criteria and insurance authorization.    Follow Up Recommendations  Outpatient OT    Assistance Recommended at Discharge Intermittent Supervision/Assistance  Patient can return home with the following  A little help with bathing/dressing/bathroom;Assistance with cooking/housework;Direct supervision/assist for medications management;Direct supervision/assist for financial management;Assist for transportation;Help with stairs or ramp for entrance   Equipment Recommendations  Tub/shower seat    Recommendations for Other Services      Precautions / Restrictions Precautions Precautions: Fall Precaution Comments: R field cut        Mobility Bed Mobility Overal bed mobility: Modified Independent                  Transfers Overall transfer level: Modified independent                       Balance                                           ADL either performed or assessed with clinical judgement   ADL Overall ADL's : Needs assistance/impaired                                       General ADL Comments: HHA for safety; bumping into door on R; began educating on set up yo increase independence and increase safety    Extremity/Trunk Assessment              Vision   Additional Comments: vision remains unchanged; complaining of increased sparks of lights/appears to increase when stressed/upset; reviewed written infomation regarding compensatory strategies for low vision; also educated on strategies to increase safety with IADL tasks; significant depth perception deficits   Perception     Praxis      Cognition Arousal/Alertness: Awake/alert Behavior During Therapy: WFL for tasks assessed/performed (tearful) Overall Cognitive Status: Within Functional Limits for tasks assessed                                          Exercises  Shoulder Instructions       General Comments educated on tasks to complete while in room to increase eye-Robertson coordination    Pertinent Vitals/ Pain       Pain Assessment Pain Assessment: Faces Faces Pain Scale: Hurts little more Pain Location: headache Pain Descriptors / Indicators: Headache Pain Intervention(s): Limited activity within patient's tolerance  Home Living                                          Prior Functioning/Environment              Frequency  Min 3X/week        Progress Toward Goals  OT Goals(current goals can now be found in the care plan section)  Progress towards OT goals: Progressing toward goals  Acute Rehab OT Goals Patient  Stated Goal: for her vision to get better OT Goal Formulation: With patient Time For Goal Achievement: 01/05/22 Potential to Achieve Goals: Good ADL Goals Additional ADL Goal #1: Pt will independently verbalize 3 compensatory strateiges for low vision Additional ADL Goal #2: Pt will demonstrate use of lighthouse scanning technique to increase safety due to R field cut  Plan Discharge plan remains appropriate    Co-evaluation                 AM-PAC OT "6 Clicks" Daily Activity     Outcome Measure   Help from another person eating meals?: None Help from another person taking care of personal grooming?: A Little Help from another person toileting, which includes using toliet, bedpan, or urinal?: A Little Help from another person bathing (including washing, rinsing, drying)?: A Little Help from another person to put on and taking off regular upper body clothing?: A Little Help from another person to put on and taking off regular lower body clothing?: A Little 6 Click Score: 19    End of Session    OT Visit Diagnosis: Unsteadiness on feet (R26.81);Low vision, both eyes (H54.2)   Activity Tolerance Patient tolerated treatment well   Patient Left Other (comment) (walking in room with son)   Nurse Communication Mobility status;Other (comment) (DC needs)        Time: 0920-1011 OT Time Calculation (min): 51 min  Charges: OT General Charges $OT Visit: 1 Visit OT Treatments $Therapeutic Activity: 38-52 mins  Maurie Boettcher, OT/L   Acute OT Clinical Specialist [place] Pager [phone] Office [phone]   [facility] 12/23/2021, 11:10 AM

## 2021-12-23 NOTE — Progress Notes (Signed)
Stroke Team Progress Note  SUBJECTIVE Patient lying in bed comfortably.  Her daughter is at the bedside.  Daughter translates for the patient.  Patient continues to have right-sided peripheral vision loss.  She is worried today that she has more difficulty in reading and she is seeing some shining lights in the right peripheral field of vision that this may be extension of her stroke and wants a brain CT scan to be repeated t   OBJECTIVE Most recent Vital Signs: Temp: 97.7 F (36.5 C) (06/22 1140) Temp Source: Oral (06/22 1140) BP: 149/74 (06/22 1140) Pulse Rate: 75 (06/22 1140) Respiratory Rate: 18 O2 Saturdation: 96%  CBG (last 3)  Recent Labs    12/22/21 2102 12/23/21 0607 12/23/21 1141  GLUCAP 351* 221* 252*       Studies:  CT head no acute abnormality. MRI brain acute cortical infarct in left occipital lobe.  Remote perforator infarct right corona radiata CTA angio head and neck no large vessel stenosis or occlusion. ECHO ejection fraction 60-65% no shunt LDL 55 mg percent.  Triglycerides 224 mg percent HbA1c 11.6  Physical Exam:    Obese middle-aged Hispanic lady not in distress. . Afebrile. Head is nontraumatic. Neck is supple without bruit.    Cardiac exam no murmur or gallop. Lungs are clear to auscultation. Distal pulses are well felt.  Neurological Exam ;  Awake  Alert oriented x 3. Normal speech and language.eye movements full without nystagmus.fundi were not visualized. Vision acuity   appears normal.  Dense right homonymous hemianopsia.  Hearing is normal. Palatal movements are normal. Face symmetric. Tongue midline. Normal strength, tone, reflexes and coordination. Normal sensation. Gait deferred.  ASSESSMENT Ms. Ivelisse Culverhouse is a 46 y.o. 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  Vascular risk factors of obesity, diabetes, hyperlipidemia, hypertension and at risk for sleep apnea.    Hospital day #  2  TREATMENT/PLAN I had a long discussion with the patient and her daughter at the bedside and explained that her vision disturbances in the right hemifield and natural part of evolution of damage from a left occipital stroke and will eventually resolve.  We will check a follow-up CT scan just to reassure her.  Continue aspirin and Plavix for 3 weeks followed by aspirin alone and aggressive risk factor modification.   continue cardiac monitoring.  Check TEE tomorrow.  Discussed with Dr. [place] I spent 35  minutes in total face-to-face time with the patient, more than 50% of which was spent in counseling and coordination of care, reviewing test results, reviewing medication and discussing or reviewing the diagnosis of cryptogenic stroke and peripheral vision loss, the prognosis and treatment options.   Antony Contras, MD Medical Director [facility] Pager: [phone] 12/23/2021 2:49 PM

## 2021-12-23 NOTE — Telephone Encounter (Signed)
Left message on voicemail to return call.

## 2021-12-23 NOTE — Progress Notes (Signed)
Physical Therapy Treatment Patient Details Name: Julie Robertson MRN: [id number] DOB: [birth year] Today's Date: 12/23/2021   History of Present Illness Pt is a 46 y/o female admitted secondary to headache, wooziness and visual changes. Imaging revealed L occipital lobe stroke with extension along the posterior left watershed.and remote R corona radiata infarct. PMH includes DM and smoker.    PT Comments    Pt standing in room on arrival.  Pt pleasant and agreeable to PT this pm.  Performed gt training with poor obstacle negotiation on the R.  Pt progressed to higher level balance activities with noticeable challenges on R side stance.      Recommendations for follow up therapy are one component of a multi-disciplinary discharge planning process, led by the attending physician.  Recommendations may be updated based on patient status, additional functional criteria and insurance authorization.  Follow Up Recommendations  Outpatient PT     Assistance Recommended at Discharge Frequent or constant Supervision/Assistance  Patient can return home with the following A little help with walking and/or transfers;A little help with bathing/dressing/bathroom;Assistance with cooking/housework;Assist for transportation;Help with stairs or ramp for entrance;Direct supervision/assist for medications management;Direct supervision/assist for financial management   Equipment Recommendations  Other (comment) (TBD)    Recommendations for Other Services       Precautions / Restrictions Precautions Precautions: Fall Precaution Comments: R field cut Restrictions Weight Bearing Restrictions: No     Mobility  Bed Mobility               General bed mobility comments: Pt up in bathroom on arrival.    Transfers Overall transfer level: Modified independent                      Ambulation/Gait Ambulation/Gait assistance: Supervision Gait Distance (Feet): 200 Feet Assistive  device: None Gait Pattern/deviations: Step-through pattern Gait velocity: Decreased     General Gait Details: Pt with poor obstacle negotiation on the R this session.  Pt presents to ambulate on L side and scan on R side to avoid obstacles.  Pt continues with poor mobility when turning quickly to the R if obstacle is in her way.   Stairs             Wheelchair Mobility    Modified Rankin (Stroke Patients Only)       Balance Overall balance assessment: Needs assistance Sitting-balance support: No upper extremity supported, Feet supported Sitting balance-Leahy Scale: Good     Standing balance support: Single extremity supported, No upper extremity supported Standing balance-Leahy Scale: Good                              Cognition Arousal/Alertness: Awake/alert Behavior During Therapy: WFL for tasks assessed/performed Overall Cognitive Status: Within Functional Limits for tasks assessed                                          Exercises General Exercises - Lower Extremity Hip ABduction/ADduction: AROM, Both, 10 reps, Standing Hip Flexion/Marching: AROM, Both, Standing, 10 reps Other Exercises Other Exercises: Single limb stance to B LEs.  Pt struggles in R stance ~4 sec before needing to R her balance.    General Comments        Pertinent Vitals/Pain Pain Assessment Pain Assessment: Faces Faces Pain Scale: Hurts little more Pain Location:  headache Pain Descriptors / Indicators: Headache Pain Intervention(s): Monitored during session, Repositioned    Home Living                          Prior Function            PT Goals (current goals can now be found in the care plan section) Acute Rehab PT Goals Patient Stated Goal: to go home Potential to Achieve Goals: Good Progress towards PT goals: Progressing toward goals    Frequency    Min 4X/week      PT Plan Current plan remains appropriate     Co-evaluation              AM-PAC PT "6 Clicks" Mobility   Outcome Measure  Help needed turning from your back to your side while in a flat bed without using bedrails?: A Little Help needed moving from lying on your back to sitting on the side of a flat bed without using bedrails?: A Little Help needed moving to and from a bed to a chair (including a wheelchair)?: A Little Help needed standing up from a chair using your arms (e.g., wheelchair or bedside chair)?: A Little Help needed to walk in hospital room?: A Little Help needed climbing 3-5 steps with a railing? : A Little 6 Click Score: 18    End of Session Equipment Utilized During Treatment: Gait belt Activity Tolerance: Patient tolerated treatment well Patient left: in bed;with call bell/phone within reach;with family/visitor present Nurse Communication: Mobility status PT Visit Diagnosis: Other abnormalities of gait and mobility (R26.89);Other symptoms and signs involving the nervous system (R29.898)     Time: 3299-2426 PT Time Calculation (min) (ACUTE ONLY): 11 min  Charges:  $Therapeutic Activity: 8-22 mins                     Erasmo Leventhal , PTA Acute Rehabilitation Services  Office [phone]    Corderius Saraceni Eli Hose 12/23/2021, 4:10 PM

## 2021-12-23 NOTE — Progress Notes (Addendum)
PROGRESS NOTE    Julie Robertson  OEV:[id number] DOB: September 06, 1975 DOA: 12/20/2021 PCP: No primary care provider on file.    Brief Narrative:   Julie Robertson is a 46 y.o. Spanish-speaking female with past medical history significant for poorly controlled type 2 diabetes mellitus, dyslipidemia, bile duct mass s/p resection, GERD, tobacco use disorder, morbid obesity who presented to [facility] ED on 6/19 with complaints of visual changes and unsteadiness on her feet.  Reports onset of symptoms 2 days prior.  She reports seeing lights in both eyes as described as being like a "disco ball".  Also reports symptoms of frontal headache and feeling dizzy.  Reports almost getting into a car accident due to her symptoms.  Denies chest pain, no palpitations, no fever/chills, no shortness of breath, no cough, no nausea/vomiting/diarrhea, no dysuria symptoms.  Patient reports not having a routine eye exam.  Her blood sugars have been in the 200s.  In the ED, patient is afebrile with mild tachypnea.  CT head with without contrast with no acute abnormality.  Hemoglobin 11.1, glucose 216, calcium 8.8.  MR brain significant patchy acute cortical infarct left occipital lobe.  Urine pregnancy screen negative.  Patient was given 1 L normal saline, Compazine, Toradol, Benadryl and Ativan.  Neurology was consulted.  TRH consulted for admission for acute CVA.  Assessment & Plan:   Acute occipital CVA Patient presenting with complaint of vision changes, headache and unsteady gait.  MRI significant patchy acute cortical infarct left occipital lobe with extension along the posterior left watershed region.  TTE with LVEF 60 to 65%, no regional wall motion abnormalities, mild concentric LVH, small pericardial effusion that is circumferential, no atrial level shunt detected by Doppler.  Ultrasound transcranial Doppler with findings of positive TCD bubble study with Valsalva only indicative of small right to left shunt.   Hemoglobin A1c 11.6, LDL 55.  Risk factors include hypertension, uncontrolled diabetes, tobacco abuse and obesity. --Neurology following, appreciate assistance --TEE pending for tomorrow --DAPT with aspirin 81 mg p.o. daily, Plavix 85 mg p.o. daily x 21 days followed by aspirin alone --No driving until vision improves, will need outpatient optometry/ophthalmology evaluation  Headache  Etiology likely related to acute occipital CVA with visual changes versus migraine --Tylenol as needed mild pain/headache --Toradol 50 mg IV q6h PRN moderate pain/headache --Imitrex PRN  Type 2 diabetes mellitus with hyperglycemia Hemoglobin A1c 11.6, poorly controlled.  Home regimen includes insulin glargine 60 units at pensively nightly, Humalog 12 units 3 times daily AC, metformin 1000 mg p.o. twice daily, glipizide 10 mg p.o. twice daily.  Etiology of poor control likely medication noncompliance coupled with dietary indiscretions. --Diabetic educator following, appreciate assistance --Semglee increased to 45u [place]assar daily --NovoLog 12 units TIDAC --Moderate SSI for coverage --CBGs qAC/HS --Dietitian consult --Eye exam outpatient  Dyslipidemia Lipid panel with total cholesterol 120, HDL 20, LDL 55, triglycerides 224. --Tricor increased to 160 mg p.o. 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  Surgical pathology did not note high-grade dysplasia or malignancy.  Tobacco use disorder Patient continues to smoke 1-2 packs/day on average.  Counseled for need for complete cessation given acute stroke. --Nicotine patch  Morbid obesity Discussed with patient needs for aggressive lifestyle changes/weight loss as this complicates all facets of care.  Outpatient follow-up with PCP.  May benefit from bariatric evaluation outpatient; but would need to prove that she could lose some weight before they would consider  any type of surgical procedure.   DVT  prophylaxis: enoxaparin (LOVENOX) injection 40 mg Start: 12/21/21 1000    Code Status: Full Code Family Communication: Family present at bedside this morning  Disposition Plan:  Level of care: Telemetry Medical Status is: Inpatient Remains inpatient appropriate because: Pending TEE tomorrow, likely discharge home following procedure    Consultants:  Neurology Cardiology for TEE  Procedures:  TTE  Antimicrobials:  None   Subjective: Patient seen and examined at bedside, resting comfortably.  Sitting at edge of bed, son present and assists with interpretation.  Plan TEE tomorrow with 30-day cardiac monitor requested by neurology.  Once again discussed that she needs more aggressive control of her diabetes, weight, and complete tobacco cessation given her elevated risk factors and acute stroke.  No other questions or concerns at this time.  Denies fever/chills/night sweats, no nausea/vomiting/diarrhea, no chest pain, no palpitations, no shortness of breath, no abdominal pain, no weakness, no fatigue, no paresthesias.  No acute events overnight per nursing staff.  Objective: Vitals:   12/23/21 0025 12/23/21 0427 12/23/21 0802 12/23/21 1140  BP: 122/69 127/73 128/70 (!) 149/74  Pulse: 77 64 67 75  Resp: '18 20 18 18  '$ Temp: 97.8 F (36.6 C) 97.7 F (36.5 C) 98 F (36.7 C) 97.7 F (36.5 C)  TempSrc: Oral  Oral Oral  SpO2: 99% 96% 98% 96%    Intake/Output Summary (Last 24 hours) at 12/23/2021 1236 Last data filed at 12/22/2021 1544 Gross per 24 hour  Intake 120 ml  Output --  Net 120 ml   There were no vitals filed for this visit.  Examination:  Physical Exam: GEN: NAD, alert and oriented x 3, obese HEENT: NCAT, PERRL, EOMI, sclera clear, MMM PULM: CTAB w/o wheezes/crackles, normal respiratory effort, on room air CV: RRR w/o M/G/R GI: abd soft, NTND, NABS, no R/G/M MSK: no peripheral edema, muscle strength globally intact 5/5 bilateral upper/lower extremities NEURO: CN  II-XII intact, no focal deficits, sensation to light touch intact PSYCH: normal mood/affect Integumentary: dry/intact, no rashes or wounds    Data Reviewed: I have personally reviewed following labs and imaging studies  CBC: Recent Labs  Lab 12/20/21 1616 12/22/21 0314  WBC 8.2 6.9  NEUTROABS 3.9  --   HGB 11.1* 9.5*  HCT 36.8 30.4*  MCV 74.6* 72.4*  PLT 361 502   Basic Metabolic Panel: Recent Labs  Lab 12/20/21 1616 12/22/21 0314  NA 136 136  K 3.8 4.0  CL 103 106  CO2 20* 23  GLUCOSE 216* 285*  BUN 16 16  CREATININE 0.81 0.92  CALCIUM 8.8* 8.6*   GFR: CrCl cannot be calculated (Unknown ideal weight.). Liver Function Tests: Recent Labs  Lab 12/22/21 0314  AST 37  ALT 33  ALKPHOS 56  BILITOT 0.4  PROT 5.9*  ALBUMIN 2.8*   No results for input(s): "LIPASE", "AMYLASE" in the last 168 hours. No results for input(s): "AMMONIA" in the last 168 hours. Coagulation Profile: No results for input(s): "INR", "PROTIME" in the last 168 hours. Cardiac Enzymes: No results for input(s): "CKTOTAL", "CKMB", "CKMBINDEX", "TROPONINI" in the last 168 hours. BNP (last 3 results) No results for input(s): "PROBNP" in the last 8760 hours. HbA1C: Recent Labs    12/21/21 0925  HGBA1C 11.6*   CBG: Recent Labs  Lab 12/22/21 1228 12/22/21 1543 12/22/21 2102 12/23/21 0607 12/23/21 1141  GLUCAP 289* 333* 351* 221* 252*   Lipid Profile: Recent Labs    12/21/21 0925  CHOL 120  HDL 20*  LDLCALC 55  TRIG 224*  CHOLHDL 6.0   Thyroid Function Tests: No results for input(s): "TSH", "T4TOTAL", "FREET4", "T3FREE", "THYROIDAB" in the last 72 hours. Anemia Panel: No results for input(s): "VITAMINB12", "FOLATE", "FERRITIN", "TIBC", "IRON", "RETICCTPCT" in the last 72 hours. Sepsis Labs: No results for input(s): "PROCALCITON", "LATICACIDVEN" in the last 168 hours.  No results found for this or any previous visit (from the past 240 hour(s)).       Radiology  Studies: VAS [place] LOWER EXTREMITY VENOUS (DVT)  Result Date: 12/22/2021  Lower Venous DVT Study Patient Name:  VIRDA BETTERS  Date of Exam:   12/22/2021 Medical Rec #: [id number]                Accession #:    [id number] Date of Birth: 06-Dec-1975                Patient Gender: F Patient Age:   12 years Exam Location:  [facility] Procedure:      VAS [place] LOWER EXTREMITY VENOUS (DVT) Referring Phys: Elwin Sleight DE LA TORRE --------------------------------------------------------------------------------  Indications: Stroke.  Comparison Study: No previous exam noted. Performing Technologist: Bobetta Lime BS, RVT  Examination Guidelines: A complete evaluation includes B-mode imaging, spectral Doppler, color Doppler, and power Doppler as needed of all accessible portions of each vessel. Bilateral testing is considered an integral part of a complete examination. Limited examinations for reoccurring indications may be performed as noted. The reflux portion of the exam is performed with the patient in reverse Trendelenburg.  +---------+---------------+---------+-----------+----------+--------------+ RIGHT    CompressibilityPhasicitySpontaneityPropertiesThrombus Aging +---------+---------------+---------+-----------+----------+--------------+ CFV      Full           Yes      Yes                                 +---------+---------------+---------+-----------+----------+--------------+ SFJ      Full                                                        +---------+---------------+---------+-----------+----------+--------------+ FV Prox  Full                                                        +---------+---------------+---------+-----------+----------+--------------+ FV Mid   Full                                                        +---------+---------------+---------+-----------+----------+--------------+ FV DistalFull                                                         +---------+---------------+---------+-----------+----------+--------------+ PFV      Full                                                        +---------+---------------+---------+-----------+----------+--------------+  POP      Full           Yes      Yes                                 +---------+---------------+---------+-----------+----------+--------------+ PTV      Full                                                        +---------+---------------+---------+-----------+----------+--------------+ PERO     Full                                                        +---------+---------------+---------+-----------+----------+--------------+   +---------+---------------+---------+-----------+----------+--------------+ LEFT     CompressibilityPhasicitySpontaneityPropertiesThrombus Aging +---------+---------------+---------+-----------+----------+--------------+ CFV      Full           Yes      Yes                                 +---------+---------------+---------+-----------+----------+--------------+ SFJ      Full                                                        +---------+---------------+---------+-----------+----------+--------------+ FV Prox  Full                                                        +---------+---------------+---------+-----------+----------+--------------+ FV Mid   Full                                                        +---------+---------------+---------+-----------+----------+--------------+ FV DistalFull                                                        +---------+---------------+---------+-----------+----------+--------------+ PFV      Full                                                        +---------+---------------+---------+-----------+----------+--------------+ POP      Full           Yes      Yes                                  +---------+---------------+---------+-----------+----------+--------------+  PTV      Full                                                        +---------+---------------+---------+-----------+----------+--------------+ PERO     Full                                                        +---------+---------------+---------+-----------+----------+--------------+     Summary: BILATERAL: - No evidence of deep vein thrombosis seen in the lower extremities, bilaterally. -No evidence of popliteal cyst, bilaterally.   *See table(s) above for measurements and observations. Electronically signed by Harold Barban MD on 12/22/2021 at 9:21:36 PM.    Final         Scheduled Meds:  aspirin EC  81 mg Oral Daily   clopidogrel  75 mg Oral Daily   enoxaparin (LOVENOX) injection  40 mg Subcutaneous Q24H   fenofibrate  160 mg Oral Daily   gabapentin  300 mg Oral TID   insulin aspart  0-15 Units Subcutaneous TID WC   insulin aspart  0-5 Units Subcutaneous QHS   insulin aspart  12 Units Subcutaneous TID WC   insulin glargine-yfgn  45 Units Subcutaneous Daily   nicotine  21 mg Transdermal Daily   pantoprazole  40 mg Oral Daily   pneumococcal 20-valent conjugate vaccine  0.5 mL Intramuscular Tomorrow-1000   Continuous Infusions:   LOS: 2 days    Time spent: 49 minutes spent on chart review, discussion with nursing staff, consultants, updating family and interview/physical exam; more than 50% of that time was spent in counseling and/or coordination of care.    Natale Thoma J [place] DO [facility] Available via Epic secure chat 7am-7pm After these hours, please refer to coverage provider listed on [email or web address] 12/23/2021, 12:36 PM

## 2021-12-23 NOTE — Progress Notes (Addendum)
    [facility] has been requested to perform a transesophageal echocardiogram on this patient for stroke. Patient is Spanish-speaking. Daughter has been interpreting per notes but for sake of this interaction needing consent, I utilized [employer] interpreter service, [place], [place] #[id number] to conduct the visit. After careful review of history and examination, the risks and benefits of transesophageal echocardiogram have been explained including risks of esophageal damage, perforation (1:10,000 risk), bleeding, pharyngeal hematoma as well as other potential complications associated with anesthesia including aspiration, arrhythmia, respiratory failure and death. Alternatives to treatment were discussed, questions were answered. Patient is willing to proceed. Scheduled tomorrow at 7:30am with Dr. Stanford Breed. Orders written including NPO after midnight except sips with meds. Sent msg to Dr. [place] to request he review/adjust patient's diabetic medicine regimen since she will be NPO after midnight and has a hybrid regimen of insulin ordered. He acknowledged request. The patient also wanted to ask neurology the question of when her f/u CT scan would occur so I messaged Dr. Leonie Man and her nurse to review.   Dr. [place] also relayed that neurology requested 30 day monitor as well. I placed order under DOD per protocol and cardmaster Wannetta Sender will relay request to office. I placed an FYI on her AVS and per our discussion Dr. [place] will relay to the patient in rounds tomorrow that this will be mailed to her house. Wannetta Sender will also assist with arranging a post-monitor cardiology f/u appt.  Charlie Pitter, PA-C 12/23/2021 3:13 PM

## 2021-12-23 NOTE — Progress Notes (Signed)
RD consulted for nutrition education regarding diabetes.   Pt resting in be at the time of visit, family at bedside also participated in education. Utilized Technical brewer [phone].  Pt was dx with DM many years ago and has received some education in the past. However, pt reports that since she didn't feel differently acutely, mostly ignored the dietary recommendations. States prior to admission, she was not eating much during the day and tended to eat a large amount at night. Drank soda and water during the day.   Lab Results  Component Value Date   HGBA1C 11.6 (H) 12/21/2021   RD provided "Carbohydrate Counting for People with Diabetes" handout from the Academy of Nutrition and Dietetics. Discussed different food groups and their effects on blood sugar, emphasizing carbohydrate-containing foods. Provided list of carbohydrates and recommended serving sizes of common foods.  Discussed importance of controlled and consistent carbohydrate intake throughout the day. Provided examples of ways to balance meals/snacks and encouraged intake of high-fiber, whole grain complex carbohydrates. Teach back method used.  Pt is motivated to improve her health and states that she wants to make changes to better manage her DM. Expect good compliance.  Current diet order is carb modified/heart healthy, patient is consuming approximately 100% of meals at this time. Labs and medications reviewed. No further nutrition interventions warranted at this time. RD contact information provided. If additional nutrition issues arise, please re-consult RD.   Julie Robertson, RD, LDN Clinical Dietitian RD pager # available in [place]  After hours/weekend pager # available in [facility]

## 2021-12-23 NOTE — Telephone Encounter (Signed)
Copied from [place] [phone]. Topic: General - Inquiry >> Dec 23, 2021  9:28 AM Erskine Squibb wrote: Reason for CRM: Patient called in stating she was returning the office call in regards to getting financial assistance. The patient called in from the hospital as she has had a stroke and is having surgery tomorrow. Patient was inquiring about a number for someone who handles the financial assistance. I tried several times to call to the clinic and there were no answers. The patient stated she was just going to send her son over later to obtain that information.

## 2021-12-23 NOTE — Progress Notes (Signed)
Event monitor per neuro and Dr. [place]

## 2021-12-24 ENCOUNTER — Encounter (HOSPITAL_COMMUNITY)
Admission: EM | Disposition: A | Discharge status: Home / Self Care | Payer: Self-pay | Source: Home / Self Care | Attending: Internal Medicine

## 2021-12-24 ENCOUNTER — Other Ambulatory Visit: Payer: Self-pay

## 2021-12-24 ENCOUNTER — Inpatient Hospital Stay (HOSPITAL_COMMUNITY): Payer: Self-pay

## 2021-12-24 ENCOUNTER — Encounter (HOSPITAL_COMMUNITY): Payer: Self-pay | Admitting: Internal Medicine

## 2021-12-24 ENCOUNTER — Inpatient Hospital Stay (HOSPITAL_COMMUNITY): Payer: Self-pay | Admitting: Certified Registered Nurse Anesthetist

## 2021-12-24 DIAGNOSIS — I639 Cerebral infarction, unspecified: Secondary | ICD-10-CM

## 2021-12-24 DIAGNOSIS — Q2112 Patent foramen ovale: Secondary | ICD-10-CM

## 2021-12-24 DIAGNOSIS — I10 Essential (primary) hypertension: Secondary | ICD-10-CM

## 2021-12-24 DIAGNOSIS — E119 Type 2 diabetes mellitus without complications: Secondary | ICD-10-CM

## 2021-12-24 DIAGNOSIS — J45909 Unspecified asthma, uncomplicated: Secondary | ICD-10-CM

## 2021-12-24 HISTORY — PX: BUBBLE STUDY: SHX6837

## 2021-12-24 HISTORY — PX: TEE WITHOUT CARDIOVERSION: SHX5443

## 2021-12-24 LAB — GLUCOSE, CAPILLARY
Glucose-Capillary: 223 mg/dL — ABNORMAL HIGH (ref 70–99)
Glucose-Capillary: 269 mg/dL — ABNORMAL HIGH (ref 70–99)

## 2021-12-24 SURGERY — ECHOCARDIOGRAM, TRANSESOPHAGEAL
Anesthesia: Monitor Anesthesia Care

## 2021-12-24 MED ORDER — FENOFIBRATE 160 MG PO TABS
160.0000 mg | ORAL_TABLET | Freq: Every day | ORAL | 2 refills | Status: DC
  Filled 2021-12-24: qty 30, 30d supply, fill #0
  Filled 2022-01-21: qty 30, 30d supply, fill #1
  Filled 2022-01-31: qty 30, 30d supply, fill #2

## 2021-12-24 MED ORDER — PROPOFOL 500 MG/50ML IV EMUL
INTRAVENOUS | Status: DC | PRN
  Administered 2021-12-24: 150 ug/kg/min via INTRAVENOUS

## 2021-12-24 MED ORDER — ASPIRIN 81 MG PO TBEC
81.0000 mg | DELAYED_RELEASE_TABLET | Freq: Every day | ORAL | 2 refills | Status: AC
Start: 2021-12-24 — End: 2022-03-24
  Filled 2021-12-24 – 2022-10-12 (×2): qty 30, 30d supply, fill #0

## 2021-12-24 MED ORDER — POLYETHYLENE GLYCOL 3350 17 G PO PACK
17.0000 g | PACK | Freq: Every day | ORAL | 0 refills | Status: DC | PRN
  Filled 2021-12-24: qty 14, 14d supply, fill #0

## 2021-12-24 MED ORDER — SORBITOL 70 % SOLN
960.0000 mL | TOPICAL_OIL | Freq: Once | ORAL | Status: AC
  Administered 2021-12-24: 960 mL via RECTAL
  Filled 2021-12-24 (×2): qty 473

## 2021-12-24 MED ORDER — NICOTINE 7 MG/24HR TD PT24
7.0000 mg | MEDICATED_PATCH | TRANSDERMAL | 0 refills | Status: AC
  Filled 2021-12-24: qty 14, 14d supply, fill #0

## 2021-12-24 MED ORDER — OMEPRAZOLE 20 MG PO CPDR
20.0000 mg | DELAYED_RELEASE_CAPSULE | Freq: Every day | ORAL | 0 refills | Status: DC
  Filled 2021-12-24 – 2022-03-04 (×3): qty 90, 90d supply, fill #0

## 2021-12-24 MED ORDER — NICOTINE 21 MG/24HR TD PT24
21.0000 mg | MEDICATED_PATCH | TRANSDERMAL | 0 refills | Status: AC
  Filled 2021-12-24: qty 14, 14d supply, fill #0

## 2021-12-24 MED ORDER — ALBUTEROL SULFATE HFA 108 (90 BASE) MCG/ACT IN AERS
2.0000 | INHALATION_SPRAY | Freq: Four times a day (QID) | RESPIRATORY_TRACT | 1 refills | Status: DC | PRN
  Filled 2021-12-24: qty 18, 25d supply, fill #0
  Filled 2022-06-08: qty 18, 25d supply, fill #1

## 2021-12-24 MED ORDER — INSULIN LISPRO (1 UNIT DIAL) 100 UNIT/ML (KWIKPEN)
12.0000 [IU] | PEN_INJECTOR | Freq: Three times a day (TID) | SUBCUTANEOUS | 2 refills | Status: DC
  Filled 2021-12-24: qty 12, 34d supply, fill #0
  Filled 2022-01-21: qty 12, 34d supply, fill #1
  Filled 2022-03-04: qty 6, 17d supply, fill #2

## 2021-12-24 MED ORDER — BASAGLAR KWIKPEN 100 UNIT/ML ~~LOC~~ SOPN
60.0000 [IU] | PEN_INJECTOR | Freq: Every day | SUBCUTANEOUS | 2 refills | Status: DC
  Filled 2021-12-24 – 2022-01-03 (×2): qty 18, 30d supply, fill #0
  Filled 2022-01-31: qty 18, 30d supply, fill #1
  Filled 2022-03-04: qty 18, 30d supply, fill #2

## 2021-12-24 MED ORDER — PROPOFOL 10 MG/ML IV BOLUS
INTRAVENOUS | Status: DC | PRN
  Administered 2021-12-24: 20 mg via INTRAVENOUS
  Administered 2021-12-24: 30 mg via INTRAVENOUS
  Administered 2021-12-24: 20 mg via INTRAVENOUS
  Administered 2021-12-24: 30 mg via INTRAVENOUS

## 2021-12-24 MED ORDER — SENNOSIDES-DOCUSATE SODIUM 8.6-50 MG PO TABS
1.0000 | ORAL_TABLET | Freq: Two times a day (BID) | ORAL | 0 refills | Status: AC
  Filled 2021-12-24: qty 60, 30d supply, fill #0

## 2021-12-24 MED ORDER — ACETAMINOPHEN 500 MG PO TABS
ORAL_TABLET | ORAL | Status: AC
  Filled 2021-12-24: qty 2

## 2021-12-24 MED ORDER — CLOPIDOGREL BISULFATE 75 MG PO TABS
75.0000 mg | ORAL_TABLET | Freq: Every day | ORAL | 0 refills | Status: AC
  Filled 2021-12-24: qty 21, 21d supply, fill #0

## 2021-12-24 MED ORDER — METFORMIN HCL 500 MG PO TABS
1000.0000 mg | ORAL_TABLET | Freq: Two times a day (BID) | ORAL | 2 refills | Status: DC
Start: 2021-12-24 — End: 2022-04-12
  Filled 2021-12-24: qty 120, 30d supply, fill #0
  Filled 2022-01-21: qty 120, 30d supply, fill #1
  Filled 2022-03-04: qty 120, 30d supply, fill #2

## 2021-12-24 MED ORDER — SODIUM CHLORIDE 0.9 % IV SOLN: INTRAVENOUS | Status: DC

## 2021-12-24 MED ORDER — ACETAMINOPHEN 500 MG PO TABS
1000.0000 mg | ORAL_TABLET | Freq: Once | ORAL | Status: AC
  Administered 2021-12-24: 1000 mg via ORAL

## 2021-12-24 MED ORDER — NICOTINE 14 MG/24HR TD PT24
14.0000 mg | MEDICATED_PATCH | TRANSDERMAL | 0 refills | Status: AC
  Filled 2021-12-24: qty 14, 14d supply, fill #0

## 2021-12-24 MED ORDER — GLIPIZIDE 10 MG PO TABS
10.0000 mg | ORAL_TABLET | Freq: Two times a day (BID) | ORAL | 2 refills | Status: DC
  Filled 2021-12-24 – 2022-01-03 (×2): qty 60, 30d supply, fill #0
  Filled 2022-01-31: qty 60, 30d supply, fill #1
  Filled 2022-03-04: qty 60, 30d supply, fill #2

## 2021-12-24 MED ORDER — SODIUM CHLORIDE 0.9 % IV SOLN
INTRAVENOUS | Status: AC | PRN
  Administered 2021-12-24: 500 mL via INTRAVENOUS

## 2021-12-26 NOTE — Anesthesia Postprocedure Evaluation (Signed)
Anesthesia Post Note  Patient: Takako Asp  Procedure(s) Performed: TRANSESOPHAGEAL ECHOCARDIOGRAM (TEE) BUBBLE STUDY     Patient location during evaluation: Endoscopy Anesthesia Type: MAC Level of consciousness: awake and alert Pain management: pain level controlled Vital Signs Assessment: post-procedure vital signs reviewed and stable Respiratory status: spontaneous breathing, nonlabored ventilation and respiratory function stable Cardiovascular status: blood pressure returned to baseline and stable Postop Assessment: no apparent nausea or vomiting Anesthetic complications: no   No notable events documented.  Last Vitals:  Vitals:   12/24/21 1154 12/24/21 1241  BP: (!) 114/55 125/63  Pulse: 61 65  Resp:    Temp:    SpO2: 95%     Last Pain:  Vitals:   12/24/21 1011  TempSrc:   PainSc: 7                  Eliceo Gladu E Charli Liberatore

## 2021-12-27 ENCOUNTER — Encounter: Payer: Self-pay | Admitting: *Deleted

## 2021-12-27 ENCOUNTER — Encounter (HOSPITAL_COMMUNITY): Payer: Self-pay | Admitting: Cardiology

## 2021-12-27 ENCOUNTER — Other Ambulatory Visit: Payer: Self-pay | Admitting: Physician Assistant

## 2021-12-27 DIAGNOSIS — I4891 Unspecified atrial fibrillation: Secondary | ICD-10-CM

## 2021-12-27 DIAGNOSIS — I639 Cerebral infarction, unspecified: Secondary | ICD-10-CM

## 2021-12-27 NOTE — Telephone Encounter (Signed)
Pt returning phone call ° °

## 2021-12-27 NOTE — Telephone Encounter (Signed)
Pt is requesting a call back from Josias.  Per Josias I advised pt he will be calling her back today before 5:00 PM.

## 2021-12-28 ENCOUNTER — Other Ambulatory Visit: Payer: Self-pay

## 2022-01-03 ENCOUNTER — Other Ambulatory Visit: Payer: Self-pay

## 2022-01-06 ENCOUNTER — Telehealth (INDEPENDENT_AMBULATORY_CARE_PROVIDER_SITE_OTHER): Payer: Self-pay | Admitting: Primary Care

## 2022-01-06 ENCOUNTER — Telehealth: Payer: Self-pay | Admitting: Emergency Medicine

## 2022-01-06 NOTE — Telephone Encounter (Signed)
Call routed

## 2022-01-06 NOTE — Telephone Encounter (Signed)
Received a call from the patient requesting to get a referral to another heart provider because the one she was referred to does not accept her insurance. She stated it is very important for the nurse to get back with her because she had a stroke.

## 2022-01-07 ENCOUNTER — Telehealth: Payer: Self-pay | Admitting: *Deleted

## 2022-01-07 NOTE — Telephone Encounter (Signed)
Received faxed notification from [employer].  They have not been able to contact pt regarding "patient benefit quote" and are cancelling her monitor.  If questions please contact [phone] option 1,3.

## 2022-01-10 ENCOUNTER — Other Ambulatory Visit: Payer: Self-pay | Admitting: *Deleted

## 2022-01-10 NOTE — Patient Outreach (Signed)
[facility]) Care Management  01/10/2022  Julie Robertson 05/26/1976 [id number]   RED ON EMMI ALERT - Stroke Day # 13 Date: 7/7 Red Alert Reason: Julie Robertson to follow up appointment?  NO   Outreach attempt #1, successful via [employer], Spanish interpreter [place], [place] # [id number].   Identity verified.  This care manager introduced self and stated purpose of call.  [facility] care management services explained.    Member report she has been recovering well, has not been to follow up appointments as they are scheduled in the future.  She had appointment scheduled for 7/11 with Dr. Jacinto Robertson however report he does not take her insurance.  State she has the orange card through the financial assistance program at [facility] and will need to see a provider directly connected to [facility].  Advised that she also has an appointment scheduled for 8/3 with Dr. Anne Robertson at [facility].  Appointment with new PCP scheduled for 10/10 but provided member with contact information as she has questions about medications (such as refills) she was previously on prior to admission.  She will call to inquire about an earlier appointment.  Also provided with contact information for neurology, she will call to schedule.  Denies need for transportation assistance.  Denies any urgent concerns, encouraged to contact this care manager with questions.    Julie Durie, RN, MSN, CCM [facility] Care Management  Community Care Manager [phone]    Plan: RN CM will

## 2022-01-10 NOTE — Patient Outreach (Signed)
Received a red flag Emmi stroke notification for Julie Robertson . I have assigned an RN Case Manager to call for follow up and determine if there are any Case Management needs.    [facility], [place], [place] Management [phone]

## 2022-01-11 ENCOUNTER — Ambulatory Visit: Payer: Self-pay | Admitting: Cardiology

## 2022-01-11 NOTE — Progress Notes (Deleted)
Primary Physician/Referring:  No primary care provider on file.  Patient ID: Julie Robertson, female    DOB: 23-Nov-1975, 46 y.o.   MRN: [id number]  No chief complaint on file.  HPI:    Julie Robertson  is a 46 y.o. ***  Past Medical History:  Diagnosis Date   Anxiety    Asthma    Diabetes mellitus without complication ([place])    Diabetic neuropathy ([place]) 12/2013   vs carpal tunnell.  numbness tingling in right fingers. rx with Gabapentin.   Dyslipidemia 08/2009   Dyspnea    Gall stones 08/2009   GERD (gastroesophageal reflux disease)    Obesity    BMI 41, 250# 03/2015   Past Surgical History:  Procedure Laterality Date   APPLICATION OF WOUND VAC N/A 05/09/2019   Procedure: APPLICATION OF WOUND VAC;  Surgeon: Olean Ree, MD;  Location: [facility];  Service: General;  Laterality: N/A;  [id number]   BUBBLE STUDY  12/24/2021   Procedure: BUBBLE STUDY;  Surgeon: Lelon Perla, MD;  Location: [place];  Service: Cardiovascular;;   CHOLECYSTECTOMY N/A 03/10/2015   Procedure: LAPAROSCOPIC CHOLECYSTECTOMY WITH INTRAOPERATIVE CHOLANGIOGRAM;  Surgeon: Georganna Skeans, MD;  Location: [place];  Service: General;  Laterality: N/A;   ERCP N/A 03/11/2015   Procedure: ENDOSCOPIC RETROGRADE CHOLANGIOPANCREATOGRAPHY (ERCP);  Surgeon: Milus Banister, MD;  Location: [place];  Service: Endoscopy;  Laterality: N/A;   ESOPHAGOGASTRODUODENOSCOPY (EGD) WITH PROPOFOL N/A 07/25/2018   Procedure: ESOPHAGOGASTRODUODENOSCOPY (EGD) WITH PROPOFOL;  Surgeon: Rush Landmark Telford Nab., MD;  Location: [facility];  Service: Gastroenterology;  Laterality: N/A;   INSERTION OF MESH N/A 05/09/2019   Procedure: INSERTION OF MESH;  Surgeon: Olean Ree, MD;  Location: [facility];  Service: General;  Laterality: N/A;   TEE WITHOUT CARDIOVERSION N/A 12/24/2021   Procedure: TRANSESOPHAGEAL ECHOCARDIOGRAM (TEE);  Surgeon: Lelon Perla, MD;  Location: [place];  Service: Cardiovascular;  Laterality:  N/A;   Transduodenal Ampullectomy  09/2015   TUBAL LIGATION     VENTRAL HERNIA REPAIR N/A 05/09/2019   Procedure: HERNIA REPAIR VENTRAL ADULT with MESH;  Surgeon: Olean Ree, MD;  Location: [facility];  Service: General;  Laterality: N/A;   Family History  Problem Relation Age of Onset   Diabetes Mother    Heart disease Mother    Hypertension Mother    Diabetes Father    Bone cancer Maternal Grandfather    Kidney disease Maternal Uncle    Other Son        had kidney removed due to gun shot wound    Social History   Tobacco Use   Smoking status: Every Day    Packs/day: 1.50    Years: 4.00    Total pack years: 6.00    Types: Cigarettes   Smokeless tobacco: Never  Substance Use Topics   Alcohol use: Yes    Comment: rare   Marital Status: Married  ROS  ***ROS Objective  There were no vitals taken for this visit. There is no height or weight on file to calculate BMI.     12/24/2021   12:41 PM 12/24/2021   11:54 AM 12/24/2021   10:11 AM  Vitals with BMI  Systolic 825 003 704  Diastolic 63 55 73  Pulse 65 61 66     ***Physical Exam  Medications and allergies  No Known Allergies   Medication list after today's encounter   Current Outpatient Medications:    albuterol (VENTOLIN HFA) 108 (90 Base) MCG/ACT inhaler, Inhale 2 puffs  into the lungs every 6 (six) hours as needed for wheezing or shortness of breath., Disp: 18 g, Rfl: 1   aspirin EC 81 MG tablet, Take 1 tablet (81 mg total) by mouth daily. Swallow whole., Disp: 30 tablet, Rfl: 2   atorvastatin (LIPITOR) 40 MG tablet, Take 1 tablet (40 mg total) by mouth at bedtime., Disp: 90 tablet, Rfl: 1   Blood Glucose Monitoring Suppl (TRUE METRIX METER) w/Device KIT, Use as directed, Disp: 1 kit, Rfl: 0   Blood Glucose Monitoring Suppl (TRUE METRIX METER) w/Device KIT, use up to 4 times daily as directed, Disp: 1 kit, Rfl: 0   clopidogrel (PLAVIX) 75 MG tablet, Take 1 tablet (75 mg total) by mouth daily for 21 days., Disp: 21  tablet, Rfl: 0   fenofibrate 160 MG tablet, Take 1 tablet (160 mg total) by mouth daily., Disp: 30 tablet, Rfl: 2   gabapentin (NEURONTIN) 300 MG capsule, Take 1 capsule (300 mg total) by mouth 3 (three) times daily., Disp: 90 capsule, Rfl: 1   glipiZIDE (GLUCOTROL) 10 MG tablet, Take 1 tablet (10 mg total) by mouth 2 (two) times daily., Disp: 60 tablet, Rfl: 2   glucose blood (TRUE METRIX BLOOD GLUCOSE TEST) test strip, Use as instructed, Disp: 100 each, Rfl: 12   glucose blood test strip, use up to 4 times daily as directed, Disp: 100 each, Rfl: 0   Insulin Glargine (BASAGLAR KWIKPEN) 100 UNIT/ML, Inject 60 Units into the skin daily., Disp: 18 mL, Rfl: 2   insulin lispro (HUMALOG) 100 UNIT/ML KwikPen, Inject 12 Units into the skin 3 (three) times daily., Disp: 10.8 mL, Rfl: 2   Insulin Pen Needle 31G X 5 MM MISC, Inject 10 units subcutaneous at bedtime, Disp: 100 each, Rfl: 3   linaclotide (LINZESS) 145 MCG CAPS capsule, Take 1 capsule (145 mcg total) by mouth daily before breakfast., Disp: 90 capsule, Rfl: 1   losartan (COZAAR) 25 MG tablet, TAKE 1 TABLET (25 MG TOTAL) BY MOUTH DAILY., Disp: 90 tablet, Rfl: 3   metFORMIN (GLUCOPHAGE) 500 MG tablet, Take 2 tablets (1,000 mg total) by mouth 2 (two) times daily with a meal., Disp: 120 tablet, Rfl: 2   nicotine (NICODERM CQ - DOSED IN MG/24 HOURS) 14 mg/24hr patch, Place 1 patch (14 mg total) onto the skin daily for 14 days., Disp: 14 patch, Rfl: 0   [START ON 01/21/2022] nicotine (NICODERM CQ - DOSED IN MG/24 HR) 7 mg/24hr patch, Place 1 patch (7 mg total) onto the skin daily for 14 days., Disp: 14 patch, Rfl: 0   omeprazole (PRILOSEC) 20 MG capsule, TAKE 1 CAPSULE (20 MG TOTAL) BY MOUTH DAILY., Disp: 90 capsule, Rfl: 0   polyethylene glycol (MIRALAX / GLYCOLAX) 17 g packet, Take 17 g by mouth daily as needed for mild constipation., Disp: 14 each, Rfl: 0   senna-docusate (SENOKOT-S) 8.6-50 MG tablet, Take 1 tablet by mouth 2 (two) times daily., Disp:  60 tablet, Rfl: 0   sorbitol 70 % SOLN, Take 30 mLs by mouth daily as needed for moderate constipation., Disp: 473 mL, Rfl: 0   TRUEplus Lancets 28G MISC, use up to 4 times daily as directed, Disp: 100 each, Rfl: 0  Laboratory examination:   Recent Labs    08/30/21 1115 12/20/21 1616 12/22/21 0314  NA 136 136 136  K 4.2 3.8 4.0  CL 99 103 106  CO2 21 20* 23  GLUCOSE 269* 216* 285*  BUN 12 16 16   CREATININE 0.60 0.81  0.92  CALCIUM 8.8 8.8* 8.6*  GFRNONAA  --  >60 >60   CrCl cannot be calculated (Unknown ideal weight.).     Latest Ref Rng & Units 12/22/2021    3:14 AM 12/20/2021    4:16 PM 08/30/2021   11:15 AM  CMP  Glucose 70 - 99 mg/dL 285  216  269   BUN 6 - 20 mg/dL 16  16  12    Creatinine 0.44 - 1.00 mg/dL 0.92  0.81  0.60   Sodium 135 - 145 mmol/L 136  136  136   Potassium 3.5 - 5.1 mmol/L 4.0  3.8  4.2   Chloride 98 - 111 mmol/L 106  103  99   CO2 22 - 32 mmol/L 23  20  21    Calcium 8.9 - 10.3 mg/dL 8.6  8.8  8.8   Total Protein 6.5 - 8.1 g/dL 5.9   6.3   Total Bilirubin 0.3 - 1.2 mg/dL 0.4   <0.2   Alkaline Phos 38 - 126 U/L 56   83   AST 15 - 41 U/L 37   59   ALT 0 - 44 U/L 33   61       Latest Ref Rng & Units 12/22/2021    3:14 AM 12/20/2021    4:16 PM 08/30/2021   11:15 AM  CBC  WBC 4.0 - 10.5 K/uL 6.9  8.2  7.0   Hemoglobin 12.0 - 15.0 g/dL 9.5  11.1  11.0   Hematocrit 36.0 - 46.0 % 30.4  36.8  36.8   Platelets 150 - 400 K/uL 305  361  326     Lipid Panel Recent Labs    08/30/21 1115 12/21/21 0925  CHOL 217* 120  TRIG 392* 224*  LDLCALC 121* 55  VLDL  --  45*  HDL 26* 20*  CHOLHDL 8.3* 6.0    HEMOGLOBIN A1C Lab Results  Component Value Date   HGBA1C 11.6 (H) 12/21/2021   MPG 286.22 12/21/2021   TSH No results for input(s): "TSH" in the last 8760 hours.  External labs:   ***  Radiology:    Cardiac Studies:   No results found for this or any previous visit from the past 1095 days.     No results found for this or any previous  visit from the past 1095 days.   ***  EKG:   ***  ***  Assessment  No diagnosis found.   No orders of the defined types were placed in this encounter.   No orders of the defined types were placed in this encounter.   There are no discontinued medications.   Recommendations:   Julie Robertson is a 46 y.o.  ***    [name], MD, [facility] 01/11/2022, 2:10 PM Office: [phone]

## 2022-01-11 NOTE — Telephone Encounter (Signed)
Pt needs an interpreter to get information will call back to get number for update.

## 2022-01-11 NOTE — Telephone Encounter (Signed)
Pt called for an update on the financial assistance application.  Pt requests call back for an update on her application.  Please Advise.

## 2022-01-12 NOTE — Telephone Encounter (Signed)
[phone] is the interpreter line that can be used to reach out to any non english speaking patient.

## 2022-01-21 ENCOUNTER — Other Ambulatory Visit: Payer: Self-pay

## 2022-01-31 ENCOUNTER — Other Ambulatory Visit: Payer: Self-pay

## 2022-01-31 ENCOUNTER — Other Ambulatory Visit (INDEPENDENT_AMBULATORY_CARE_PROVIDER_SITE_OTHER): Payer: Self-pay | Admitting: Primary Care

## 2022-01-31 DIAGNOSIS — E1165 Type 2 diabetes mellitus with hyperglycemia: Secondary | ICD-10-CM

## 2022-01-31 NOTE — Telephone Encounter (Signed)
Left vm with patient to pick up application for CAFA

## 2022-02-01 NOTE — Telephone Encounter (Signed)
Patient was last seen in February of 2022. Please send refill if appropriate.

## 2022-02-03 ENCOUNTER — Ambulatory Visit: Payer: Self-pay | Admitting: Cardiology

## 2022-02-09 ENCOUNTER — Other Ambulatory Visit: Payer: Self-pay

## 2022-03-01 ENCOUNTER — Ambulatory Visit: Payer: Self-pay | Attending: Cardiology | Admitting: Cardiology

## 2022-03-04 ENCOUNTER — Other Ambulatory Visit: Payer: Self-pay

## 2022-03-04 ENCOUNTER — Other Ambulatory Visit (INDEPENDENT_AMBULATORY_CARE_PROVIDER_SITE_OTHER): Payer: Self-pay | Admitting: Primary Care

## 2022-03-04 DIAGNOSIS — E782 Mixed hyperlipidemia: Secondary | ICD-10-CM

## 2022-03-04 MED ORDER — ATORVASTATIN CALCIUM 40 MG PO TABS
40.0000 mg | ORAL_TABLET | Freq: Every day | ORAL | 1 refills | Status: DC
  Filled 2022-03-04: qty 30, 30d supply, fill #0

## 2022-03-11 ENCOUNTER — Other Ambulatory Visit: Payer: Self-pay

## 2022-03-11 ENCOUNTER — Other Ambulatory Visit (INDEPENDENT_AMBULATORY_CARE_PROVIDER_SITE_OTHER): Payer: Self-pay | Admitting: Primary Care

## 2022-03-13 MED FILL — Omeprazole Cap Delayed Release 20 MG: ORAL | 30 days supply | Qty: 30 | Fill #0 | Status: AC

## 2022-03-13 MED FILL — Fenofibrate Tab 160 MG: ORAL | 90 days supply | Qty: 90 | Fill #0 | Status: AC

## 2022-03-14 ENCOUNTER — Other Ambulatory Visit: Payer: Self-pay

## 2022-03-16 ENCOUNTER — Other Ambulatory Visit: Payer: Self-pay

## 2022-03-16 ENCOUNTER — Other Ambulatory Visit (INDEPENDENT_AMBULATORY_CARE_PROVIDER_SITE_OTHER): Payer: Self-pay | Admitting: Primary Care

## 2022-03-16 MED ORDER — TRUEPLUS LANCETS 28G MISC
1.0000 | Freq: Four times a day (QID) | 0 refills | Status: DC
  Filled 2022-03-16 – 2022-06-07 (×2): qty 100, 25d supply, fill #0

## 2022-03-16 MED ORDER — TRUE METRIX BLOOD GLUCOSE TEST VI STRP
ORAL_STRIP | 0 refills | Status: DC
Start: 2022-03-16 — End: 2022-06-08
  Filled 2022-03-16 – 2022-06-07 (×2): qty 100, 25d supply, fill #0

## 2022-03-23 ENCOUNTER — Other Ambulatory Visit: Payer: Self-pay

## 2022-04-04 ENCOUNTER — Other Ambulatory Visit: Payer: Self-pay

## 2022-04-05 ENCOUNTER — Other Ambulatory Visit (INDEPENDENT_AMBULATORY_CARE_PROVIDER_SITE_OTHER): Payer: Self-pay | Admitting: Primary Care

## 2022-04-05 ENCOUNTER — Other Ambulatory Visit: Payer: Self-pay

## 2022-04-05 DIAGNOSIS — E1165 Type 2 diabetes mellitus with hyperglycemia: Secondary | ICD-10-CM

## 2022-04-06 ENCOUNTER — Other Ambulatory Visit: Payer: Self-pay | Admitting: Pharmacist

## 2022-04-06 ENCOUNTER — Other Ambulatory Visit: Payer: Self-pay

## 2022-04-06 DIAGNOSIS — E782 Mixed hyperlipidemia: Secondary | ICD-10-CM

## 2022-04-06 MED ORDER — ATORVASTATIN CALCIUM 40 MG PO TABS
40.0000 mg | ORAL_TABLET | Freq: Every day | ORAL | 0 refills | Status: DC
  Filled 2022-04-06: qty 30, 30d supply, fill #0

## 2022-04-06 MED ORDER — BASAGLAR KWIKPEN 100 UNIT/ML ~~LOC~~ SOPN
60.0000 [IU] | PEN_INJECTOR | Freq: Every day | SUBCUTANEOUS | 0 refills | Status: DC
  Filled 2022-04-06: qty 18, 30d supply, fill #0

## 2022-04-06 MED ORDER — GLIPIZIDE 10 MG PO TABS
10.0000 mg | ORAL_TABLET | Freq: Two times a day (BID) | ORAL | 0 refills | Status: DC
  Filled 2022-04-06: qty 60, 30d supply, fill #0

## 2022-04-07 ENCOUNTER — Inpatient Hospital Stay: Payer: Self-pay | Admitting: Neurology

## 2022-04-12 ENCOUNTER — Ambulatory Visit: Payer: Self-pay | Attending: Family Medicine | Admitting: Family Medicine

## 2022-04-12 ENCOUNTER — Encounter: Payer: Self-pay | Admitting: Family Medicine

## 2022-04-12 ENCOUNTER — Other Ambulatory Visit: Payer: Self-pay

## 2022-04-12 VITALS — BP 121/82 | HR 80 | Temp 98.3°F | Ht 65.0 in | Wt 259.0 lb

## 2022-04-12 DIAGNOSIS — Z8673 Personal history of transient ischemic attack (TIA), and cerebral infarction without residual deficits: Secondary | ICD-10-CM

## 2022-04-12 DIAGNOSIS — K219 Gastro-esophageal reflux disease without esophagitis: Secondary | ICD-10-CM

## 2022-04-12 DIAGNOSIS — I10 Essential (primary) hypertension: Secondary | ICD-10-CM

## 2022-04-12 DIAGNOSIS — Q2112 Patent foramen ovale: Secondary | ICD-10-CM

## 2022-04-12 DIAGNOSIS — K5909 Other constipation: Secondary | ICD-10-CM

## 2022-04-12 DIAGNOSIS — S80861A Insect bite (nonvenomous), right lower leg, initial encounter: Secondary | ICD-10-CM

## 2022-04-12 DIAGNOSIS — Z794 Long term (current) use of insulin: Secondary | ICD-10-CM

## 2022-04-12 DIAGNOSIS — Z23 Encounter for immunization: Secondary | ICD-10-CM

## 2022-04-12 DIAGNOSIS — E782 Mixed hyperlipidemia: Secondary | ICD-10-CM

## 2022-04-12 DIAGNOSIS — W57XXXA Bitten or stung by nonvenomous insect and other nonvenomous arthropods, initial encounter: Secondary | ICD-10-CM

## 2022-04-12 DIAGNOSIS — E1169 Type 2 diabetes mellitus with other specified complication: Secondary | ICD-10-CM

## 2022-04-12 LAB — GLUCOSE, POCT (MANUAL RESULT ENTRY): POC Glucose: 355 mg/dl — AB (ref 70–99)

## 2022-04-12 LAB — POCT GLYCOSYLATED HEMOGLOBIN (HGB A1C): HbA1c, POC (controlled diabetic range): 12.6 % — AB (ref 0.0–7.0)

## 2022-04-12 MED ORDER — GABAPENTIN 300 MG PO CAPS
300.0000 mg | ORAL_CAPSULE | Freq: Three times a day (TID) | ORAL | 1 refills | Status: DC
  Filled 2022-04-12: qty 90, 30d supply, fill #0
  Filled 2022-05-09: qty 90, 30d supply, fill #1
  Filled 2022-06-07: qty 90, 30d supply, fill #2
  Filled 2022-07-07 (×3): qty 90, 30d supply, fill #3

## 2022-04-12 MED ORDER — OMEPRAZOLE 20 MG PO CPDR
20.0000 mg | DELAYED_RELEASE_CAPSULE | Freq: Every day | ORAL | 1 refills | Status: DC
  Filled 2022-04-12: qty 30, 30d supply, fill #0
  Filled 2022-08-05: qty 90, 90d supply, fill #1

## 2022-04-12 MED ORDER — HYDROCORTISONE 0.5 % EX CREA
1.0000 | TOPICAL_CREAM | Freq: Two times a day (BID) | CUTANEOUS | 0 refills | Status: DC
  Filled 2022-04-12: qty 30, 15d supply, fill #0

## 2022-04-12 MED ORDER — LINACLOTIDE 145 MCG PO CAPS
145.0000 ug | ORAL_CAPSULE | Freq: Every day | ORAL | 1 refills | Status: DC
  Filled 2022-04-12: qty 90, 90d supply, fill #0

## 2022-04-12 MED ORDER — OZEMPIC (0.25 OR 0.5 MG/DOSE) 2 MG/3ML ~~LOC~~ SOPN
0.2500 mg | PEN_INJECTOR | SUBCUTANEOUS | 6 refills | Status: DC
  Filled 2022-04-12: qty 3, 56d supply, fill #0
  Filled 2022-06-06 – 2022-07-13 (×2): qty 3, 56d supply, fill #1

## 2022-04-12 MED ORDER — BASAGLAR KWIKPEN 100 UNIT/ML ~~LOC~~ SOPN
60.0000 [IU] | PEN_INJECTOR | Freq: Every day | SUBCUTANEOUS | 0 refills | Status: DC
  Filled 2022-04-12 – 2022-05-09 (×2): qty 18, 30d supply, fill #0

## 2022-04-12 MED ORDER — INSULIN LISPRO (1 UNIT DIAL) 100 UNIT/ML (KWIKPEN)
14.0000 [IU] | PEN_INJECTOR | Freq: Three times a day (TID) | SUBCUTANEOUS | 2 refills | Status: DC
  Filled 2022-04-12: qty 12, 29d supply, fill #0
  Filled 2022-05-09: qty 12, 29d supply, fill #1
  Filled 2022-06-07: qty 12, 29d supply, fill #2

## 2022-04-12 MED ORDER — LOSARTAN POTASSIUM 25 MG PO TABS
25.0000 mg | ORAL_TABLET | Freq: Every day | ORAL | 1 refills | Status: DC
  Filled 2022-04-12: qty 90, fill #0
  Filled 2022-05-09: qty 90, 90d supply, fill #0
  Filled 2022-08-05: qty 90, 90d supply, fill #1

## 2022-04-12 MED ORDER — GLIPIZIDE 10 MG PO TABS
10.0000 mg | ORAL_TABLET | Freq: Two times a day (BID) | ORAL | 1 refills | Status: DC
  Filled 2022-04-12 – 2022-05-09 (×2): qty 180, 90d supply, fill #0
  Filled 2022-08-05: qty 180, 90d supply, fill #1

## 2022-04-12 MED ORDER — ATORVASTATIN CALCIUM 40 MG PO TABS
40.0000 mg | ORAL_TABLET | Freq: Every day | ORAL | 1 refills | Status: DC
  Filled 2022-04-12 – 2022-05-09 (×2): qty 90, 90d supply, fill #0
  Filled 2022-08-05: qty 90, 90d supply, fill #1

## 2022-04-12 MED ORDER — METFORMIN HCL 500 MG PO TABS
1000.0000 mg | ORAL_TABLET | Freq: Two times a day (BID) | ORAL | 2 refills | Status: DC
  Filled 2022-04-12: qty 120, 30d supply, fill #0
  Filled 2022-05-09: qty 120, 30d supply, fill #1
  Filled 2022-06-07: qty 120, 30d supply, fill #2

## 2022-04-12 NOTE — Patient Instructions (Signed)
Semaglutide Injection Qu es este medicamento? La SEMAGLUTIDA trata la diabetes tipo 2. Acta aumentando los niveles de insulina en el cuerpo, lo cual disminuye el azcar en la sangre (glucosa). Tambin reduce la cantidad de azcar liberada en la sangre y desacelera la digestin. Tambin se puede usar para disminuir el riesgo de ataque cardaco y accidente cerebrovascular en personas con diabetes tipo 2. Con frecuencia, este medicamento se combina con cambios en la dieta y el ejercicio. Este medicamento puede ser utilizado para otros usos; si tiene alguna pregunta consulte con su proveedor de atencin mdica o con su farmacutico. MARCAS COMUNES: OZEMPIC Qu le debo informar a mi profesional de la salud antes de tomar este medicamento? Necesitan saber si usted presenta alguno de los siguientes problemas o situaciones: Tumores endocrinos (neoplasia endcrina mltiple tipo II) o si alguien en su familiar tuvo esos tumores Enfermedad ocular, problemas de la visin Antecedentes de pancreatitis Enfermedad renal Problemas estomacales Cncer de tiroides o si alguien en su familia tuvo cncer de tiroides Una reaccin alrgica o inusual a la semaglutida, a otros medicamentos, alimentos, colorantes o conservantes Si est embarazada o buscando quedar embarazada Si est amamantando a un beb Cmo debo utilizar este medicamento? Este medicamento se debe inyectar debajo de la piel de la parte superior de la pierna (muslo), del rea del estmago o de la parte superior del brazo. Se administra una vez por semana (cada 7 das). Le ensearn cmo preparar y administrar este medicamento. Use el medicamento exactamente como se le indique. Use su medicamento a intervalos regulares. No lo use con una frecuencia mayor a la indicada. Si usa este medicamento con insulina, debe inyectar este medicamento y la insulina por separado. No los mezcle. No se aplique una inyeccin al lado de la otra. Cambie (rote) los sitios de  inyeccin con cada inyeccin. Es importante que deseche las agujas y las jeringas usadas en un recipiente resistente a los pinchazos. No las deseche en la basura. Si no tiene un recipiente resistente a los pinchazos, llame a su farmacutico o a su equipo de atencin para obtenerlo. Su farmacutico le dar una Gua del medicamento especial (MedGuide, nombre en ingls) con cada receta y en cada ocasin que la vuelva a surtir. Asegrese de leer esta informacin cada vez cuidadosamente. Este medicamento viene con INSTRUCCIONES DE USO. Pdale a su farmacutico que le indique cmo usar este medicamento. Lea la informacin atentamente. Hable con su farmacutico o su equipo de atencin si tiene alguna pregunta. Hable con su equipo de atencin sobre el uso de este medicamento en nios. Puede requerir atencin especial. Sobredosis: Pngase en contacto inmediatamente con un centro toxicolgico o una sala de urgencia si usted cree que haya tomado demasiado medicamento. ATENCIN: Este medicamento es solo para usted. No comparta este medicamento con nadie. Qu sucede si me olvido de una dosis? Si se olvida una dosis, tmela lo antes posible, si es dentro de los 5 das despus de la fecha en que deba tomarla. Luego tome la prxima dosis en el horario semanal habitual. Si han pasado ms de 5 das despus de olvidarse la dosis, no tome la dosis que se olvid. Administre la prxima dosis a la hora habitual. No se administre dosis adicionales o dobles. Si tiene preguntas sobre una dosis que se olvid, contacte a su equipo de atencin para que le brinde asesoramiento. Qu puede interactuar con este medicamento? Otros medicamentos para la diabetes Muchos medicamentos pueden causar cambios en el nivel de azcar en la sangre. Estos   incluyen: Bebidas con alcohol Medicamentos antivirales para el VIH o SIDA Aspirina y otros medicamentos tipo aspirina Ciertos medicamentos para la presin arterial, enfermedad cardiaca y  frecuencia cardiaca irregular Cromo Diurticos Hormonas femeninas, tales como estrgenos o progestinas, pldoras anticonceptivas Fenofibrato Gemfibrozil Isoniazida Lanreotida Hormonas masculinas o esteroides anablicos IMAO, tales como Carbex, Eldepryl, Marplan, Nardil y Parnate Medicamentos para bajar de peso Medicamentos para alergias, asma, resfriados o tos Medicamentos para depresin, ansiedad o trastornos psicticos Niacina Nicotina AINE, medicamentos para el dolor y la inflamacin, tales como ibuprofeno o naproxeno Octreotida Pasireotida Pentamidina Fenitona Probenecid Antibiticos del grupo de las quinolonas, tales como ciprofloxacino, levofloxacino y ofloxacino Algunos suplementos dietticos a base de hierbas Medicamentos esteroideos, tales como la prednisona o la cortisona Sulfametoxazol; trimetoprima Hormonas tiroideas Algunos medicamentos pueden ocultar los sntomas de advertencia de niveles bajos de azcar en la sangre (hipoglucemia). Es posible que deba monitorear ms atentamente su nivel de azcar en la sangre si est tomando uno de estos medicamentos. Estos medicamentos incluyen: Betabloqueadores, que con frecuencia se usan para la presin arterial alta o problemas cardiacos (algunos ejemplos son atenolol, metoprolol y propranolol) Clonidina Guanetidina Reserpina Puede ser que esta lista no menciona todas las posibles interacciones. Informe a su profesional de la salud de todos los productos a base de hierbas, medicamentos de venta libre o suplementos nutritivos que est tomando. Si usted fuma, consume bebidas alcohlicas o si utiliza drogas ilegales, indqueselo tambin a su profesional de la salud. Algunas sustancias pueden interactuar con su medicamento. A qu debo estar atento al usar este medicamento? Visite a su equipo de atencin para que revise su evolucin peridicamente. Beba abundante cantidad de lquidos mientras usa este medicamento. Consulte con su  equipo de atencin si tiene un ataque de diarrea grave, nuseas y vmitos. La prdida de demasiado lquido corporal puede hacer que sea peligroso usar este medicamento. Se monitorizar una prueba llamada Hemoglobina A1C (A1C). Es un anlisis de sangre sencillo. Mide su control del nivel de azcar en la sangre durante los ltimos 2 a 3 meses. Se le realizar esta prueba cada 3 a 6 meses. Aprenda a revisar su nivel de azcar en la sangre. Conozca los sntomas del nivel bajo y alto de azcar en la sangre, y cmo controlarlos. Siempre lleve con usted una fuente rpida de azcar por si tiene sntomas de nivel bajo de azcar en la sangre. Algunos ejemplos incluyen caramelos duros de azcar o tabletas de glucosa. Asegrese de que otras personas sepan que usted se puede ahogar si come o bebe cuando presenta sntomas graves de nivel bajo de azcar en la sangre, como convulsiones o prdida del conocimiento. Deben obtener ayuda mdica de inmediato. Informe a su equipo de atencin si tiene niveles altos de azcar en la sangre. Es posible que deba cambiar la dosis de su medicamento. Si est enfermo o hace ms ejercicio que lo habitual, es posible que necesite cambiar la dosis de su medicamento. No saltee comidas. Pregunte a su equipo de atencin si debe evitar el alcohol. Muchos productos de venta libre para la tos y el resfriado contienen azcar o alcohol. Estos pueden afectar los niveles de azcar en la sangre. Los inyectores nunca deben compartirse. Incluso si se cambia la aguja, al compartir se pueden contagiar virus como la hepatitis o el VIH. Use un brazalete o una cadena de identificacin mdica, y lleve con usted una tarjeta que describa su enfermedad, detalles de su medicamento y horario de dosis. No debe quedar embarazada mientras est tomando este medicamento. Las   mujeres deben informar a su equipo de atencin si estn buscando quedar embarazadas o si creen que podran estar embarazadas. Existe la posibilidad  de efectos secundarios graves en un beb sin nacer. Para obtener ms informacin, hable con su equipo de atencin. Qu efectos secundarios puedo tener al utilizar este medicamento? Efectos secundarios que debe informar a su equipo de atencin tan pronto como sea posible: Reacciones alrgicas: erupcin cutnea, comezn/picazn, urticaria, hinchazn de la cara, los labios, la lengua o la garganta Cambio en la visin Deshidratacin: aumento de la sed, boca seca, sensacin de desmayo o aturdimiento, dolor de cabeza, orina amarilla oscura o marrn Problemas en la vescula biliar: dolor de estmago intenso, nuseas, vmitos, fiebre Palpitaciones cardacas: frecuencia cardiaca rpida, intensa o irregular Lesin en los riones: disminucin en la cantidad de orina, hinchazn de los tobillos, las manos o los pies Pancreatitis: dolor abdominal intenso que se extiende a la espalda o empeora despus de comer o al tacto, fiebre, nuseas, vmitos Cncer de tiroides: masa o bulto nuevo en el cuello, dolor o problemas para tragar, problemas respiratorios, ronquera Efectos secundarios que generalmente no requieren atencin mdica (debe informarlos a su equipo de atencin si persisten o si son molestos): Diarrea Prdida del apetito Nuseas Dolor estomacal Vmito Puede ser que esta lista no menciona todos los posibles efectos secundarios. Comunquese a su mdico por asesoramiento mdico sobre los efectos secundarios. Usted puede informar los efectos secundarios a la FDA por telfono al 1-800-FDA-1088. Dnde debo guardar mi medicina? Mantenga fuera del alcance de los nios. Guarde los inyectores sin abrir en el refrigerador a una temperatura de entre 2 y 8 grados Celsius (entre 36 y 46 grados Fahrenheit). No congele. Proteja de la luz y del calor. Despus de usar el inyector por primera vez, se puede almacenar por 56 das a temperatura ambiente entre 15 y 30 grados Celsius (59 y 86 grados Fahrenheit) o en el  refrigerador. Deseche el inyector usado 56 das despus del primer uso o despus de la fecha de vencimiento, lo que suceda primero. No almacene el inyector con la aguja puesta. Si se deja la aguja puesta, es posible que se escape medicamento del inyector. ATENCIN: Este folleto es un resumen. Puede ser que no cubra toda la posible informacin. Si usted tiene preguntas acerca de esta medicina, consulte con su mdico, su farmacutico o su profesional de la salud.  2023 Elsevier/Gold Standard (2021-01-18 00:00:00)  

## 2022-04-12 NOTE — Progress Notes (Signed)
Subjective:  Patient ID: Julie Robertson, female    DOB: Nov 02, 1975  Age: 46 y.o. MRN: [id number]  CC: New Patient (Initial Visit)   HPI Julie Robertson is a 46 y.o. year old female with a history of left occipital lobe stroke in 12/2021, type 2 diabetes mellitus (A1c 12.6), dyslipidemia, GERD, nicotine dependence who presents to establish care. Hospitalized in 12/2021 with a new diagnosis of CVA. She had presented with unstable gait, visual changes and brain imaging was in keeping with a stroke.  MRI brain revealed: IMPRESSION: Brain MRI:   1. Patchy acute cortical infarcts in the left occipital lobe with extension along the posterior left watershed. 2. Remote perforator infarct the right corona radiata.   Cervical spine:   1. Normal motion degraded appearance of the cord. 2. C5-6 large right paracentral to foraminal protrusion encroaching on the right cord and causing right foraminal impingement.     TEE revealed: EF of 55 to 60%, normal LV function , agitated saline contrast bubble study positive for intracardiac shunting observed within 3-6 cardiac cycles, PFO with atrial septal aneurysm.  Subsequently discharged to follow-up with PT/OT outpatient, continue Plavix and aspirin for 21 days followed by aspirin alone, no driving until cleared after ophthalmology evaluation, neurology follow-up. She was also referred to cardiology outpatient for evaluation of her PFO.  Interval History:  Cardiology appointment was scheduled for 01/2022 but she never kept the appointment due to cost and lack of medical coverage. She is yet to see neurology and never underwent outpatient OT, PT.Marland Kitchen She has been without medications for 1 week.  She has no residual deficits besides occasional memory lapses.  Currently does not drive.  She complains of a rash on her right ankle after insect bite which is pruritic. Past Medical History:  Diagnosis Date   Anxiety    Asthma    CVA  (cerebral vascular accident) ([place])    Diabetes mellitus without complication ([place])    Diabetic neuropathy ([place]) 12/2013   vs carpal tunnell.  numbness tingling in right fingers. rx with Gabapentin.   Dyslipidemia 08/2009   Dyspnea    Gall stones 08/2009   GERD (gastroesophageal reflux disease)    Obesity    BMI 41, 250# 03/2015    Past Surgical History:  Procedure Laterality Date   APPLICATION OF WOUND VAC N/A 05/09/2019   Procedure: APPLICATION OF WOUND VAC;  Surgeon: Olean Ree, MD;  Location: [facility];  Service: General;  Laterality: N/A;  [id number]   BUBBLE STUDY  12/24/2021   Procedure: BUBBLE STUDY;  Surgeon: Lelon Perla, MD;  Location: [place];  Service: Cardiovascular;;   CHOLECYSTECTOMY N/A 03/10/2015   Procedure: LAPAROSCOPIC CHOLECYSTECTOMY WITH INTRAOPERATIVE CHOLANGIOGRAM;  Surgeon: Georganna Skeans, MD;  Location: [place];  Service: General;  Laterality: N/A;   ERCP N/A 03/11/2015   Procedure: ENDOSCOPIC RETROGRADE CHOLANGIOPANCREATOGRAPHY (ERCP);  Surgeon: Milus Banister, MD;  Location: [place];  Service: Endoscopy;  Laterality: N/A;   ESOPHAGOGASTRODUODENOSCOPY (EGD) WITH PROPOFOL N/A 07/25/2018   Procedure: ESOPHAGOGASTRODUODENOSCOPY (EGD) WITH PROPOFOL;  Surgeon: Rush Landmark Telford Nab., MD;  Location: [facility];  Service: Gastroenterology;  Laterality: N/A;   INSERTION OF MESH N/A 05/09/2019   Procedure: INSERTION OF MESH;  Surgeon: Olean Ree, MD;  Location: [facility];  Service: General;  Laterality: N/A;   TEE WITHOUT CARDIOVERSION N/A 12/24/2021   Procedure: TRANSESOPHAGEAL ECHOCARDIOGRAM (TEE);  Surgeon: Lelon Perla, MD;  Location: [place];  Service: Cardiovascular;  Laterality: N/A;   Transduodenal Ampullectomy  09/2015  TUBAL LIGATION     VENTRAL HERNIA REPAIR N/A 05/09/2019   Procedure: HERNIA REPAIR VENTRAL ADULT with MESH;  Surgeon: Olean Ree, MD;  Location: [facility];  Service: General;  Laterality: N/A;    Family History   Problem Relation Age of Onset   Diabetes Mother    Heart disease Mother    Hypertension Mother    Diabetes Father    Bone cancer Maternal Grandfather    Kidney disease Maternal Uncle    Other Son        had kidney removed due to gun shot wound    Social History   Socioeconomic History   Marital status: Married    Spouse name: Not on file   Number of children: 2   Years of education: Not on file   Highest education level: Not on file  Occupational History   Occupation: house cleaning  Tobacco Use   Smoking status: Every Day    Packs/day: 1.50    Years: 4.00    Total pack years: 6.00    Types: Cigarettes   Smokeless tobacco: Never  Vaping Use   Vaping Use: Never used  Substance and Sexual Activity   Alcohol use: Yes    Comment: rare   Drug use: No   Sexual activity: Not on file  Other Topics Concern   Not on file  Social History Narrative   Patient has 2 sons one is 75, the other 66 as of 03/2015. Her kids are not the children of her current husband. As of 03/2015 she is not employed outside the home.   Social Determinants of Health   Financial Resource Strain: Not on file  Food Insecurity: Not on file  Transportation Needs: Not on file  Physical Activity: Not on file  Stress: Not on file  Social Connections: Not on file    No Known Allergies  Outpatient Medications Prior to Visit  Medication Sig Dispense Refill   albuterol (VENTOLIN HFA) 108 (90 Base) MCG/ACT inhaler Inhale 2 puffs into the lungs every 6 (six) hours as needed for wheezing or shortness of breath. 18 g 1   Blood Glucose Monitoring Suppl (TRUE METRIX METER) w/Device KIT Use as directed 1 kit 0   Blood Glucose Monitoring Suppl (TRUE METRIX METER) w/Device KIT use up to 4 times daily as directed 1 kit 0   fenofibrate 160 MG tablet Take 1 tablet (160 mg total) by mouth daily. 30 tablet 2   glucose blood (TRUE METRIX BLOOD GLUCOSE TEST) test strip Use as instructed 100 each 12   glucose blood (TRUE  METRIX BLOOD GLUCOSE TEST) test strip use up to 4 times daily as directed 100 each 0   Insulin Pen Needle 31G X 5 MM MISC Inject 10 units subcutaneous at bedtime 100 each 3   polyethylene glycol (MIRALAX / GLYCOLAX) 17 g packet Take 17 g by mouth daily as needed for mild constipation. 14 each 0   sorbitol 70 % SOLN Take 30 mLs by mouth daily as needed for moderate constipation. 473 mL 0   TRUEplus Lancets 28G MISC Use 4 (four) times daily. 100 each 0   atorvastatin (LIPITOR) 40 MG tablet Take 1 tablet (40 mg total) by mouth at bedtime. 30 tablet 0   gabapentin (NEURONTIN) 300 MG capsule Take 1 capsule (300 mg total) by mouth 3 (three) times daily. 90 capsule 1   glipiZIDE (GLUCOTROL) 10 MG tablet Take 1 tablet (10 mg total) by mouth 2 (two) times daily. [place]  tablet 0   Insulin Glargine (BASAGLAR KWIKPEN) 100 UNIT/ML Inject 60 Units into the skin daily. 18 mL 0   linaclotide (LINZESS) 145 MCG CAPS capsule Take 1 capsule (145 mcg total) by mouth daily before breakfast. 90 capsule 1   losartan (COZAAR) 25 MG tablet TAKE 1 TABLET (25 MG TOTAL) BY MOUTH DAILY. 90 tablet 3   omeprazole (PRILOSEC) 20 MG capsule TAKE 1 CAPSULE (20 MG TOTAL) BY MOUTH DAILY. 90 capsule 0   insulin lispro (HUMALOG) 100 UNIT/ML KwikPen Inject 12 Units into the skin 3 (three) times daily. 10.8 mL 2   metFORMIN (GLUCOPHAGE) 500 MG tablet Take 2 tablets (1,000 mg total) by mouth 2 (two) times daily with a meal. 120 tablet 2   No facility-administered medications prior to visit.     ROS Review of Systems  Constitutional:  Negative for activity change and appetite change.  HENT:  Negative for sinus pressure and sore throat.   Respiratory:  Negative for chest tightness, shortness of breath and wheezing.   Cardiovascular:  Negative for chest pain and palpitations.  Gastrointestinal:  Negative for abdominal distention, abdominal pain and constipation.  Genitourinary: Negative.   Musculoskeletal: Negative.   Skin:  Positive for  rash.  Psychiatric/Behavioral:  Negative for behavioral problems and dysphoric mood.     Objective:  BP 121/82   Pulse 80   Temp 98.3 F (36.8 C) (Oral)   Ht 5' 5"  (1.651 m)   Wt 259 lb (117.5 kg)   SpO2 94%   BMI 43.10 kg/m      04/12/2022    2:00 PM 12/24/2021   12:41 PM 12/24/2021   11:54 AM  BP/Weight  Systolic BP 650 354 656  Diastolic BP 82 63 55  Wt. (Lbs) 259    BMI 43.1 kg/m2        Physical Exam Constitutional:      Appearance: She is well-developed.  Cardiovascular:     Rate and Rhythm: Normal rate.     Heart sounds: Normal heart sounds. No murmur heard. Pulmonary:     Effort: Pulmonary effort is normal.     Breath sounds: Normal breath sounds. No wheezing or rales.  Chest:     Chest wall: No tenderness.  Abdominal:     General: Bowel sounds are normal. There is no distension.     Palpations: Abdomen is soft. There is no mass.     Tenderness: There is no abdominal tenderness.  Musculoskeletal:        General: Normal range of motion.     Right lower leg: No edema.     Left lower leg: No edema.  Skin:    Comments: Cluster of rash inferior to right lateral malleolus with superficial scabs  Neurological:     Mental Status: She is alert and oriented to person, place, and time.  Psychiatric:        Mood and Affect: Mood normal.        Latest Ref Rng & Units 12/22/2021    3:14 AM 12/20/2021    4:16 PM 08/30/2021   11:15 AM  CMP  Glucose 70 - 99 mg/dL 285  216  269   BUN 6 - 20 mg/dL 16  16  12    Creatinine 0.44 - 1.00 mg/dL 0.92  0.81  0.60   Sodium 135 - 145 mmol/L 136  136  136   Potassium 3.5 - 5.1 mmol/L 4.0  3.8  4.2   Chloride 98 - 111 mmol/L 106  103  99   CO2 22 - 32 mmol/L 23  20  21    Calcium 8.9 - 10.3 mg/dL 8.6  8.8  8.8   Total Protein 6.5 - 8.1 g/dL 5.9   6.3   Total Bilirubin 0.3 - 1.2 mg/dL 0.4   <0.2   Alkaline Phos 38 - 126 U/L 56   83   AST 15 - 41 U/L 37   59   ALT 0 - 44 U/L 33   61     Lipid Panel     Component Value  Date/Time   CHOL 120 12/21/2021 0925   CHOL 217 (H) 08/30/2021 1115   TRIG 224 (H) 12/21/2021 0925   HDL 20 (L) 12/21/2021 0925   HDL 26 (L) 08/30/2021 1115   CHOLHDL 6.0 12/21/2021 0925   VLDL 45 (H) 12/21/2021 0925   LDLCALC 55 12/21/2021 0925   LDLCALC 121 (H) 08/30/2021 1115    CBC    Component Value Date/Time   WBC 6.9 12/22/2021 0314   RBC 4.20 12/22/2021 0314   HGB 9.5 (L) 12/22/2021 0314   HGB 11.0 (L) 08/30/2021 1115   HCT 30.4 (L) 12/22/2021 0314   HCT 36.8 08/30/2021 1115   PLT 305 12/22/2021 0314   PLT 326 08/30/2021 1115   MCV 72.4 (L) 12/22/2021 0314   MCV 76 (L) 08/30/2021 1115   MCH 22.6 (L) 12/22/2021 0314   MCHC 31.3 12/22/2021 0314   RDW 15.6 (H) 12/22/2021 0314   RDW 15.2 08/30/2021 1115   LYMPHSABS 3.5 12/20/2021 1616   LYMPHSABS 2.1 08/30/2021 1115   MONOABS 0.5 12/20/2021 1616   EOSABS 0.4 12/20/2021 1616   EOSABS 0.4 08/30/2021 1115   BASOSABS 0.0 12/20/2021 1616   BASOSABS 0.0 08/30/2021 1115    Lab Results  Component Value Date   HGBA1C 12.6 (A) 04/12/2022    Assessment & Plan:  1. Type 2 diabetes mellitus with other specified complication, with long-term current use of insulin (HCC) Uncontrolled with A1c of 12.6; goal is less than 7.0 She has been out of her medications for 1 week which I do not believe is sufficient to cause A1c of 12.6.  She needs optimization of her medication regimen We will place on GLP-1 RA after shared decision making we will titrate up to the maximum tolerable dose Follow-up with the clinical pharmacist for this Counseled on Diabetic diet, my plate method, 638 minutes of moderate intensity exercise/week Blood sugar logs with fasting goals of 80-120 mg/dl, random of less than 180 and in the event of sugars less than 60 mg/dl or greater than 400 mg/dl encouraged to notify the clinic. Advised on the need for annual eye exams, annual foot exams, Pneumonia vaccine. - POCT glucose (manual entry) - POCT glycosylated  hemoglobin (Hb A1C) - Microalbumin/Creatinine Ratio, Urine - Semaglutide,0.25 or 0.5MG/DOS, (OZEMPIC, 0.25 OR 0.5 MG/DOSE,) 2 MG/3ML SOPN; Inject 0.25 mg into the skin once a week.  Dispense: 3 mL; Refill: 6 - gabapentin (NEURONTIN) 300 MG capsule; Take 1 capsule (300 mg total) by mouth 3 (three) times daily.  Dispense: 270 capsule; Refill: 1 - glipiZIDE (GLUCOTROL) 10 MG tablet; Take 1 tablet (10 mg total) by mouth 2 (two) times daily.  Dispense: 180 tablet; Refill: 1 - Insulin Glargine (BASAGLAR KWIKPEN) 100 UNIT/ML; Inject 60 Units into the skin daily.  Dispense: 18 mL; Refill: 0 - metFORMIN (GLUCOPHAGE) 500 MG tablet; Take 2 tablets (1,000 mg total) by mouth 2 (two) times daily with a meal.  Dispense:  120 tablet; Refill: 2 - insulin lispro (HUMALOG) 100 UNIT/ML KwikPen; Inject 14 Units into the skin 3 (three) times daily.  Dispense: 12 mL; Refill: 2  2. History of stroke No residual weakness She does have slight residual memory changes and still does not drive due to visual abnormalities poststroke Advise she is not cleared to drive until she has been seen by ophthalmology She has not been taking aspirin but completed 21 days of Plavix.  Advised to resume aspirin Continue statin Risk factor modification - atorvastatin (LIPITOR) 40 MG tablet; Take 1 tablet (40 mg total) by mouth at bedtime.  Dispense: 90 tablet; Refill: 1  3. Chronic constipation Stable - linaclotide (LINZESS) 145 MCG CAPS capsule; Take 1 capsule (145 mcg total) by mouth daily before breakfast.  Dispense: 90 capsule; Refill: 1  4. Mixed hyperlipidemia Controlled Low-cholesterol diet - atorvastatin (LIPITOR) 40 MG tablet; Take 1 tablet (40 mg total) by mouth at bedtime.  Dispense: 90 tablet; Refill: 1  5. Essential hypertension Controlled Counseled on blood pressure goal of less than 130/80, low-sodium, DASH diet, medication compliance, 150 minutes of moderate intensity exercise per week. Discussed medication  compliance, adverse effects. - losartan (COZAAR) 25 MG tablet; TAKE 1 TABLET (25 MG TOTAL) BY MOUTH DAILY.  Dispense: 90 tablet; Refill: 1  6. Insect bite of right lower leg, initial encounter - hydrocortisone cream 0.5 %; Apply 1 Application topically 2 (two) times daily.  Dispense: 30 g; Refill: 0  7. Patent foramen ovale Did not keep previous appointment with cardiology due to lack of medical coverage Advised to obtain paperwork for the [place] discount at the front desk. - Ambulatory referral to Cardiology  8. Gastroesophageal reflux disease without esophagitis Controlled - omeprazole (PRILOSEC) 20 MG capsule; Take 1 capsule (20 mg total) by mouth daily.  Dispense: 90 capsule; Refill: 1  9. Need for immunization against influenza - Flu Vaccine QUAD 33moIM (Fluarix, Fluzone & Alfiuria Quad PF)   Meds ordered this encounter  Medications   Semaglutide,0.25 or 0.5MG/DOS, (OZEMPIC, 0.25 OR 0.5 MG/DOSE,) 2 MG/3ML SOPN    Sig: Inject 0.25 mg into the skin once a week.    Dispense:  3 mL    Refill:  6   gabapentin (NEURONTIN) 300 MG capsule    Sig: Take 1 capsule (300 mg total) by mouth 3 (three) times daily.    Dispense:  270 capsule    Refill:  1   glipiZIDE (GLUCOTROL) 10 MG tablet    Sig: Take 1 tablet (10 mg total) by mouth 2 (two) times daily.    Dispense:  180 tablet    Refill:  1   atorvastatin (LIPITOR) 40 MG tablet    Sig: Take 1 tablet (40 mg total) by mouth at bedtime.    Dispense:  90 tablet    Refill:  1   Insulin Glargine (BASAGLAR KWIKPEN) 100 UNIT/ML    Sig: Inject 60 Units into the skin daily.    Dispense:  18 mL    Refill:  0   metFORMIN (GLUCOPHAGE) 500 MG tablet    Sig: Take 2 tablets (1,000 mg total) by mouth 2 (two) times daily with a meal.    Dispense:  120 tablet    Refill:  2   insulin lispro (HUMALOG) 100 UNIT/ML KwikPen    Sig: Inject 14 Units into the skin 3 (three) times daily.    Dispense:  12 mL    Refill:  2   losartan  (COZAAR) 25  MG tablet    Sig: TAKE 1 TABLET (25 MG TOTAL) BY MOUTH DAILY.    Dispense:  90 tablet    Refill:  1   linaclotide (LINZESS) 145 MCG CAPS capsule    Sig: Take 1 capsule (145 mcg total) by mouth daily before breakfast.    Dispense:  90 capsule    Refill:  1   omeprazole (PRILOSEC) 20 MG capsule    Sig: Take 1 capsule (20 mg total) by mouth daily.    Dispense:  90 capsule    Refill:  1   hydrocortisone cream 0.5 %    Sig: Apply 1 Application topically 2 (two) times daily.    Dispense:  30 g    Refill:  0    Follow-up: Return in about 1 month (around 05/13/2022) for Blood sugar evaluation with Lurena Joiner, Medical conditions with PCP in 3 months.       Charlott Rakes, MD, FAAFP. [facility] and [place], [place]   04/12/2022, 3:26 PM

## 2022-04-12 NOTE — Progress Notes (Signed)
Sore on right ankle [id number] A1C-12.6

## 2022-04-13 LAB — MICROALBUMIN / CREATININE URINE RATIO
Creatinine, Urine: 62.2 mg/dL
Microalb/Creat Ratio: <5 mg/g creat (ref 0–29)
Microalbumin, Urine: <3 ug/mL

## 2022-04-26 ENCOUNTER — Other Ambulatory Visit: Payer: Self-pay

## 2022-04-27 ENCOUNTER — Other Ambulatory Visit: Payer: Self-pay

## 2022-04-29 ENCOUNTER — Other Ambulatory Visit: Payer: Self-pay

## 2022-05-09 ENCOUNTER — Other Ambulatory Visit: Payer: Self-pay

## 2022-05-10 ENCOUNTER — Other Ambulatory Visit: Payer: Self-pay

## 2022-05-13 ENCOUNTER — Ambulatory Visit: Payer: Self-pay | Admitting: Pharmacist

## 2022-05-27 ENCOUNTER — Other Ambulatory Visit: Payer: Self-pay

## 2022-06-02 ENCOUNTER — Other Ambulatory Visit: Payer: Self-pay

## 2022-06-06 ENCOUNTER — Other Ambulatory Visit: Payer: Self-pay

## 2022-06-06 ENCOUNTER — Other Ambulatory Visit (INDEPENDENT_AMBULATORY_CARE_PROVIDER_SITE_OTHER): Payer: Self-pay | Admitting: Family Medicine

## 2022-06-06 ENCOUNTER — Other Ambulatory Visit: Payer: Self-pay | Admitting: Family Medicine

## 2022-06-06 DIAGNOSIS — Z794 Long term (current) use of insulin: Secondary | ICD-10-CM

## 2022-06-06 MED ORDER — FENOFIBRATE 160 MG PO TABS
160.0000 mg | ORAL_TABLET | Freq: Every day | ORAL | 1 refills | Status: DC
  Filled 2022-06-06: qty 30, 30d supply, fill #0
  Filled 2022-07-07 (×2): qty 30, 30d supply, fill #1

## 2022-06-06 MED ORDER — BASAGLAR KWIKPEN 100 UNIT/ML ~~LOC~~ SOPN
60.0000 [IU] | PEN_INJECTOR | Freq: Every day | SUBCUTANEOUS | 0 refills | Status: DC
  Filled 2022-06-06: qty 18, 30d supply, fill #0

## 2022-06-06 NOTE — Telephone Encounter (Signed)
Pt following up on refill request for  fenofibrate 160 MG tablet  Pt would like to pick up today, b/c she is also picking up her insulin.  Pt states she does not drive, and makes it inconvenient to make several trips to the pharmacy. She would like to pick up all at one trip.  [place]

## 2022-06-07 ENCOUNTER — Other Ambulatory Visit: Payer: Self-pay | Admitting: Family Medicine

## 2022-06-07 ENCOUNTER — Other Ambulatory Visit: Payer: Self-pay

## 2022-06-07 ENCOUNTER — Other Ambulatory Visit (INDEPENDENT_AMBULATORY_CARE_PROVIDER_SITE_OTHER): Payer: Self-pay | Admitting: Primary Care

## 2022-06-07 ENCOUNTER — Other Ambulatory Visit: Payer: Self-pay | Admitting: Pharmacist

## 2022-06-07 MED ORDER — NOVOLOG FLEXPEN 100 UNIT/ML ~~LOC~~ SOPN
14.0000 [IU] | PEN_INJECTOR | Freq: Three times a day (TID) | SUBCUTANEOUS | 3 refills | Status: DC
  Filled 2022-06-07: qty 9, 21d supply, fill #0
  Filled 2022-07-07 (×3): qty 9, 21d supply, fill #1
  Filled 2022-08-05: qty 9, 21d supply, fill #2
  Filled 2022-08-30: qty 9, 21d supply, fill #3

## 2022-06-07 MED ORDER — LANCETS MISC
0 refills | Status: DC
  Filled 2022-06-07: qty 100, 25d supply, fill #0

## 2022-06-07 MED ORDER — GLUCOSE BLOOD VI STRP
ORAL_STRIP | 0 refills | Status: DC
  Filled 2022-06-07: qty 100, 25d supply, fill #0

## 2022-06-07 MED ORDER — BLOOD GLUCOSE MONITOR KIT
PACK | 11 refills | Status: AC
  Filled 2022-06-07: qty 1, 30d supply, fill #0
  Filled 2022-06-08: qty 1, 1d supply, fill #0
  Filled 2022-12-21: qty 1, 1d supply, fill #1

## 2022-06-07 MED ORDER — TRUEPLUS 5-BEVEL PEN NEEDLES 32G X 4 MM MISC
3 refills | Status: AC
  Filled 2022-06-07: qty 100, 25d supply, fill #0
  Filled 2022-07-07: qty 100, 25d supply, fill #1
  Filled 2022-08-30: qty 100, 25d supply, fill #2
  Filled 2022-11-07 – 2022-11-25 (×2): qty 100, 25d supply, fill #3

## 2022-06-07 NOTE — Telephone Encounter (Signed)
Requested medication (s) are due for refill today - expired Rx  Requested medication (s) are on the active medication list -yes  Future visit scheduled -yes  Last refill: 09/01/20 #100 3RF  Notes to clinic: expired Rx, outside provider  Requested Prescriptions  Pending Prescriptions Disp Refills   Insulin Pen Needle (TRUEPLUS 5-BEVEL PEN NEEDLES) 32G X 4 MM MISC 100 each 3    Sig: USE AS DIRECTED     Endocrinology: Diabetes - Testing Supplies Passed - 06/07/2022  3:38 PM      Passed - Valid encounter within last 12 months    Recent Outpatient Visits           1 month ago Type 2 diabetes mellitus with other specified complication, with long-term current use of insulin ([place])   [place], [place], MD   9 months ago Uncontrolled type 2 diabetes mellitus with hyperglycemia, with long-term current use of insulin ([place])   [place], Michelle P, NP   1 year ago Uncontrolled type 2 diabetes mellitus with hyperglycemia, with long-term current use of insulin ([place])   [place], Michelle P, NP   1 year ago Cervical cancer screening   [place], Michelle P, NP   1 year ago Uncontrolled type 2 diabetes mellitus with hyperglycemia, with long-term current use of insulin ([place])   [place], [place], NP       Future Appointments             In 1 week Daisy Blossom, Jarome Matin, [place]   In 1 month Margarita Rana, [place], MD [place]               Requested Prescriptions  Pending Prescriptions Disp Refills   Insulin Pen Needle (TRUEPLUS 5-BEVEL PEN NEEDLES) 32G X 4 MM MISC 100 each 3    Sig: USE AS DIRECTED     Endocrinology: Diabetes - Testing Supplies Passed - 06/07/2022  3:38 PM      Passed - Valid encounter within last 12 months    Recent  Outpatient Visits           1 month ago Type 2 diabetes mellitus with other specified complication, with long-term current use of insulin ([place])   [place], [place], MD   9 months ago Uncontrolled type 2 diabetes mellitus with hyperglycemia, with long-term current use of insulin ([place])   [place], Michelle P, NP   1 year ago Uncontrolled type 2 diabetes mellitus with hyperglycemia, with long-term current use of insulin ([place])   [place], Michelle P, NP   1 year ago Cervical cancer screening   [place] Kerin Perna, NP   1 year ago Uncontrolled type 2 diabetes mellitus with hyperglycemia, with long-term current use of insulin ([place])   [place], [place], NP       Future Appointments             In 1 week Daisy Blossom, Jarome Matin, [place]   In 1 month Charlott Rakes, MD [place]

## 2022-06-08 ENCOUNTER — Other Ambulatory Visit: Payer: Self-pay

## 2022-06-09 ENCOUNTER — Telehealth: Payer: Self-pay

## 2022-06-09 ENCOUNTER — Other Ambulatory Visit: Payer: Self-pay

## 2022-06-09 NOTE — Telephone Encounter (Signed)
Dr. Margarita Rana,   Had this information sent to me concerning this patient's extended Medicaid plan.

## 2022-06-09 NOTE — Telephone Encounter (Signed)
Ozempic prior authorization has been denied(for denial reasons new to me-this is the extended Medicaid plan that began 06/03/22).  She has to have trial and failure of 3 preferred formulary alternatives first. I see a history of Metformin and Glipizide but none of the others:

## 2022-06-10 ENCOUNTER — Other Ambulatory Visit: Payer: Self-pay

## 2022-06-10 MED ORDER — DAPAGLIFLOZIN PROPANEDIOL 10 MG PO TABS
10.0000 mg | ORAL_TABLET | Freq: Every day | ORAL | 3 refills | Status: DC
  Filled 2022-06-10: qty 30, 30d supply, fill #0
  Filled 2022-07-07 (×2): qty 30, 30d supply, fill #1

## 2022-06-10 NOTE — Telephone Encounter (Signed)
I have sent a prescription for Wilder Glade to her pharmacy.  Thank you.

## 2022-06-10 NOTE — Addendum Note (Signed)
Addended by: Charlott Rakes on: 06/10/2022 10:50 AM   Modules accepted: Orders

## 2022-06-17 ENCOUNTER — Ambulatory Visit: Payer: Self-pay | Admitting: Pharmacist

## 2022-07-07 ENCOUNTER — Other Ambulatory Visit: Payer: Self-pay

## 2022-07-07 ENCOUNTER — Other Ambulatory Visit: Payer: Self-pay | Admitting: Family Medicine

## 2022-07-07 ENCOUNTER — Ambulatory Visit: Payer: Medicaid Other | Admitting: Cardiology

## 2022-07-07 ENCOUNTER — Encounter: Payer: Self-pay | Admitting: Cardiology

## 2022-07-07 VITALS — BP 118/72 | HR 97 | Resp 16 | Ht 65.0 in | Wt 262.0 lb

## 2022-07-07 DIAGNOSIS — R002 Palpitations: Secondary | ICD-10-CM

## 2022-07-07 DIAGNOSIS — R072 Precordial pain: Secondary | ICD-10-CM

## 2022-07-07 DIAGNOSIS — F17210 Nicotine dependence, cigarettes, uncomplicated: Secondary | ICD-10-CM

## 2022-07-07 DIAGNOSIS — E1165 Type 2 diabetes mellitus with hyperglycemia: Secondary | ICD-10-CM

## 2022-07-07 DIAGNOSIS — I639 Cerebral infarction, unspecified: Secondary | ICD-10-CM

## 2022-07-07 DIAGNOSIS — E785 Hyperlipidemia, unspecified: Secondary | ICD-10-CM

## 2022-07-07 DIAGNOSIS — Z794 Long term (current) use of insulin: Secondary | ICD-10-CM

## 2022-07-07 DIAGNOSIS — Q2112 Aneurysm of heart: Secondary | ICD-10-CM

## 2022-07-07 DIAGNOSIS — I10 Essential (primary) hypertension: Secondary | ICD-10-CM

## 2022-07-07 MED ORDER — METFORMIN HCL 500 MG PO TABS
1000.0000 mg | ORAL_TABLET | Freq: Two times a day (BID) | ORAL | 2 refills | Status: DC
  Filled 2022-07-07: qty 120, 30d supply, fill #0
  Filled 2022-08-05: qty 120, 30d supply, fill #1

## 2022-07-07 MED ORDER — BASAGLAR KWIKPEN 100 UNIT/ML ~~LOC~~ SOPN
60.0000 [IU] | PEN_INJECTOR | Freq: Every day | SUBCUTANEOUS | 2 refills | Status: DC
  Filled 2022-07-07 – 2022-07-08 (×4): qty 18, 30d supply, fill #0
  Filled 2022-07-25 (×4): qty 18, 30d supply, fill #1

## 2022-07-07 NOTE — Progress Notes (Signed)
ID:  Julie Robertson, DOB 02-Jan-1976, MRN [id number]  PCP:  Charlott Rakes, MD  Cardiologist:  Rex Kras, DO, [facility] (established care 07/07/2022)  REASON FOR CONSULT: Recent stroke and PFO  REQUESTING PHYSICIAN:  Charlott Rakes, MD [place],  [place] [address]  Chief Complaint  Patient presents with   New Patient (Initial Visit)    PFO    HPI  Julie Robertson is a 47 y.o. Spanish-speaking female who presents to the clinic for evaluation after recent stroke and diagnosis of patent foramen ovale at the request of Charlott Rakes, MD. 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  Patient predominantly speaks Spanish and translation during today's office visit provided by Spanish-speaking medical assistant.  She is accompanied by her friend which provides verbal consent with having her present during today's encounter.  Patient was diagnosed with a stroke in June 2023.  During the workup she was noted to have a PFO and was recommended outpatient follow-up.  However due to the insurance reasons she did not follow-up until now.  No significant residual deficits remaining from her recent stroke in June 2023.    Review of systems positive for precordial pain, usually brought on by stressful situations, left-sided, 7 out of 10, better with resting and relaxing and deep breaths.  Symptoms are usually lasting for 30 minutes, and self-limited.  Last episode was 2 weeks ago.  FUNCTIONAL STATUS: No structured exercise program or daily routine.   ALLERGIES: No Known Allergies  MEDICATION LIST PRIOR TO VISIT: Current Meds  Medication Sig   albuterol (VENTOLIN HFA) 108 (90 Base) MCG/ACT inhaler Inhale 2 puffs into the lungs  every 6 (six) hours as needed for wheezing or shortness of breath.   atorvastatin (LIPITOR) 40 MG tablet Take 1 tablet (40 mg total) by mouth at bedtime.   blood glucose meter kit and supplies KIT Use up to four times daily as directed.   Blood Glucose Monitoring Suppl (TRUE METRIX METER) w/Device KIT Use as directed   Blood Glucose Monitoring Suppl (TRUE METRIX METER) w/Device KIT use up to 4 times daily as directed   dapagliflozin propanediol (FARXIGA) 10 MG TABS tablet Take 1 tablet (10 mg total) by mouth daily.   fenofibrate 160 MG tablet Take 1 tablet (160 mg total) by mouth daily.   gabapentin (NEURONTIN) 300 MG capsule Take 1 capsule (300 mg total) by mouth 3 (three) times daily.   glipiZIDE (GLUCOTROL) 10 MG tablet Take 1 tablet (10 mg total) by mouth 2 (two) times daily.   glucose blood (TRUE METRIX BLOOD GLUCOSE TEST) test strip Use as instructed   glucose blood test strip use as directed   hydrocortisone cream 0.5 % Apply 1 Application topically 2 (two) times daily.   insulin aspart (NOVOLOG FLEXPEN) 100 UNIT/ML FlexPen Inject 14 Units into the skin 3 (three) times daily with meals.   Insulin Pen Needle (TRUEPLUS 5-BEVEL PEN NEEDLES) 32G X 4 MM MISC USE AS DIRECTED   Lancets MISC use as directed   linaclotide (LINZESS) 145 MCG CAPS capsule Take 1 capsule (145 mcg total) by mouth daily before breakfast.   losartan (COZAAR) 25 MG tablet TAKE 1 TABLET (25 MG TOTAL) BY MOUTH DAILY.   omeprazole (PRILOSEC) 20 MG capsule Take 1 capsule (20 mg total) by mouth daily.  Semaglutide,0.25 or 0.5MG/DOS, (OZEMPIC, 0.25 OR 0.5 MG/DOSE,) 2 MG/3ML SOPN Inject 0.25 mg into the skin once a week.   [DISCONTINUED] Insulin Glargine (BASAGLAR KWIKPEN) 100 UNIT/ML Inject 60 Units into the skin daily.   [DISCONTINUED] metFORMIN (GLUCOPHAGE) 500 MG tablet Take 2 tablets (1,000 mg total) by mouth 2 (two) times daily with a meal.     PAST MEDICAL HISTORY: Past Medical History:  Diagnosis Date   Anxiety     Asthma    CVA (cerebral vascular accident) ([place])    Diabetes mellitus without complication ([place])    Diabetic neuropathy ([place]) 12/2013   vs carpal tunnell.  numbness tingling in right fingers. rx with Gabapentin.   Dyslipidemia 08/2009   Dyspnea    Gall stones 08/2009   GERD (gastroesophageal reflux disease)    Obesity    BMI 41, 250# 03/2015    PAST SURGICAL HISTORY: Past Surgical History:  Procedure Laterality Date   APPLICATION OF WOUND VAC N/A 05/09/2019   Procedure: APPLICATION OF WOUND VAC;  Surgeon: Olean Ree, MD;  Location: [facility];  Service: General;  Laterality: N/A;  [id number]   BUBBLE STUDY  12/24/2021   Procedure: BUBBLE STUDY;  Surgeon: Lelon Perla, MD;  Location: [facility];  Service: Cardiovascular;;   CHOLECYSTECTOMY N/A 03/10/2015   Procedure: LAPAROSCOPIC CHOLECYSTECTOMY WITH INTRAOPERATIVE CHOLANGIOGRAM;  Surgeon: Georganna Skeans, MD;  Location: [facility];  Service: General;  Laterality: N/A;   ERCP N/A 03/11/2015   Procedure: ENDOSCOPIC RETROGRADE CHOLANGIOPANCREATOGRAPHY (ERCP);  Surgeon: Milus Banister, MD;  Location: [facility];  Service: Endoscopy;  Laterality: N/A;   ESOPHAGOGASTRODUODENOSCOPY (EGD) WITH PROPOFOL N/A 07/25/2018   Procedure: ESOPHAGOGASTRODUODENOSCOPY (EGD) WITH PROPOFOL;  Surgeon: Rush Landmark Telford Nab., MD;  Location: [facility];  Service: Gastroenterology;  Laterality: N/A;   INSERTION OF MESH N/A 05/09/2019   Procedure: INSERTION OF MESH;  Surgeon: Olean Ree, MD;  Location: [facility];  Service: General;  Laterality: N/A;   TEE WITHOUT CARDIOVERSION N/A 12/24/2021   Procedure: TRANSESOPHAGEAL ECHOCARDIOGRAM (TEE);  Surgeon: Lelon Perla, MD;  Location: [facility];  Service: Cardiovascular;  Laterality: N/A;   Transduodenal Ampullectomy  09/2015   TUBAL LIGATION     VENTRAL HERNIA REPAIR N/A 05/09/2019   Procedure: HERNIA REPAIR VENTRAL ADULT with MESH;  Surgeon: Olean Ree, MD;  Location: [facility];  Service: General;   Laterality: N/A;    FAMILY HISTORY: The patient family history includes Bone cancer in her maternal grandfather; Diabetes in her father and mother; Heart attack in her father; Heart disease in her mother; Hypertension in her mother; Kidney disease in her maternal uncle; Other in her son. She was adopted.  SOCIAL HISTORY:  The patient  reports that she has quit smoking. Her smoking use included cigarettes. She has a 6.00 pack-year smoking history. She has never used smokeless tobacco. She reports current alcohol use. She reports that she does not use drugs.  REVIEW OF SYSTEMS: Review of Systems  Cardiovascular:  Positive for chest pain (see HPI) and palpitations. Negative for claudication, dyspnea on exertion, irregular heartbeat, leg swelling, near-syncope, orthopnea, paroxysmal nocturnal dyspnea and syncope.  Respiratory:  Negative for shortness of breath.   Hematologic/Lymphatic: Negative for bleeding problem.  Musculoskeletal:  Negative for muscle cramps and myalgias.  Neurological:  Negative for dizziness and light-headedness.    PHYSICAL EXAM:    07/07/2022    2:28 PM 04/12/2022    2:00 PM 12/24/2021   12:41 PM  Vitals with BMI  Height _0  _1   Weight 262 lbs 259 lbs   BMI 29.5 18.8   Systolic 416 606 301  Diastolic 72 82 63  Pulse 97 80 65    Physical Exam  Constitutional: No distress.  Age appropriate, hemodynamically stable.   Neck: No JVD present.  Cardiovascular: Normal rate, regular rhythm, S1 normal, S2 normal, intact distal pulses and normal pulses. Exam reveals no gallop, no S3 and no S4.  No murmur heard. Pulmonary/Chest: Effort normal and breath sounds normal. No stridor. She has no wheezes. She has no rales.  Abdominal: Soft. Bowel sounds are normal. She exhibits no distension. There is no abdominal tenderness.  Obese  Musculoskeletal:        General: No edema.     Cervical back: Neck supple.  Neurological: She is alert and oriented to person, place,  and time. She has intact cranial nerves (2-12).  Skin: Skin is warm and moist.   RADIOLOGY CT brain without contrast 12/20/2021: Chronic changes with no acute intracranial process identified.   MRI brain with and without contrast 12/21/2021: Brain MRI: 1. Patchy acute cortical infarcts in the left occipital lobe with extension along the posterior left watershed. 2. Remote perforator infarct the right corona radiata.   Cervical spine: 1. Normal motion degraded appearance of the cord. 2. C5-6 large right paracentral to foraminal protrusion encroaching on the right cord and causing right foraminal impingement.  CT angio head and neck with and without contrast code stroke protocol 12/21/2021: 1. No emergent vascular finding. 2. Atherosclerosis without flow limiting stenosis of major vessels. 3. Left basal ganglia and left occipital cortex calcifications, are there risk factors for remote neurocysticercosis?  CT of the head without contrast 12/23/2021: 1. No acute intracranial hemorrhage. 2. Patchy left occipital lobe peau attenuation, compatible with known area of infarction.   CARDIAC DATABASE: EKG: 07/07/2022: Normal sinus rhythm, 87 bpm, without underlying ischemia or injury pattern.  Echocardiogram: 12/21/2021:  1. Left ventricular ejection fraction, by estimation, is 60 to 65%. The  left ventricle has normal function. The left ventricle has no regional  wall motion abnormalities. There is mild concentric left ventricular  hypertrophy. Left ventricular diastolic  parameters were normal.   2. Right ventricular systolic function is normal. The right ventricular  size is normal. Tricuspid regurgitation signal is inadequate for assessing  PA pressure.   3. A small pericardial effusion is present. The pericardial effusion is  circumferential.   4. The mitral valve is normal in structure. No evidence of mitral valve  regurgitation. No evidence of mitral stenosis.   5. The aortic valve  is tricuspid. There is mild thickening of the aortic  valve. Aortic valve regurgitation is not visualized. No aortic stenosis is  present.     Transesophageal echocardiogram 12/24/2021: Atrial septal aneurysm; no shunting noted with color doppler but positive saline microcavitation study suggests PFO. 2.Left ventricular ejection fraction, by estimation, is 55 to 60%. The left ventricle has normal function. The left ventricle has no regional wall motion abnormalities. 3. Right ventricular systolic function is normal. The right ventricular size is normal. 4. No left atrial/left atrial appendage thrombus was detected. 5. The mitral valve is normal in structure. Trivial mitral valve regurgitation. The aortic valve is tricuspid. Aortic valve regurgitation is trivial. Aortic valve sclerosis is present, with no evidence of aortic valve stenosis. 6. Agitated saline contrast bubble study was positive with shunting observed within 3-6 cardiac cycles suggestive of interatrial shunt.  Stress Testing: No results found for this or any previous visit  from the past 1095 days.   Heart Catheterization: None  Lower extremity venous duplex bilateral: 12/22/2021: - No evidence of deep vein thrombosis seen in the lower extremities, bilaterally.  -No evidence of popliteal cyst, bilaterally.   Transcranial Doppler with bubbles: 12/21/2021 Positive TCD Bubble study with valasalva only indicative of a small right to left shunt   LABORATORY DATA:    Latest Ref Rng & Units 12/22/2021    3:14 AM 12/20/2021    4:16 PM 08/30/2021   11:15 AM  CBC  WBC 4.0 - 10.5 K/uL 6.9  8.2  7.0   Hemoglobin 12.0 - 15.0 g/dL 9.5  11.1  11.0   Hematocrit 36.0 - 46.0 % 30.4  36.8  36.8   Platelets 150 - 400 K/uL 305  361  326        Latest Ref Rng & Units 12/22/2021    3:14 AM 12/20/2021    4:16 PM 08/30/2021   11:15 AM  CMP  Glucose 70 - 99 mg/dL 285  216  269   BUN 6 - 20 mg/dL _0 Creatinine 0.44 - 1.00 mg/dL  0.92  0.81  0.60   Sodium 135 - 145 mmol/L 136  136  136   Potassium 3.5 - 5.1 mmol/L 4.0  3.8  4.2   Chloride 98 - 111 mmol/L 106  103  99   CO2 22 - 32 mmol/L _1 Calcium 8.9 - 10.3 mg/dL 8.6  8.8  8.8   Total Protein 6.5 - 8.1 g/dL 5.9   6.3   Total Bilirubin 0.3 - 1.2 mg/dL 0.4   <0.2   Alkaline Phos 38 - 126 U/L 56   83   AST 15 - 41 U/L 37   59   ALT 0 - 44 U/L 33   61     Lipid Panel  Lab Results  Component Value Date   CHOL 120 12/21/2021   HDL 20 (L) 12/21/2021   LDLCALC 55 12/21/2021   TRIG 224 (H) 12/21/2021   CHOLHDL 6.0 12/21/2021   No components found for: "NTPROBNP" No results for input(s): "PROBNP" in the last 8760 hours. No results for input(s): "TSH" in the last 8760 hours.  BMP Recent Labs    08/30/21 1115 12/20/21 1616 12/22/21 0314  NA 136 136 136  K 4.2 3.8 4.0  CL 99 103 106  CO2 21 20* 23  GLUCOSE 269* 216* 285*  BUN _2 CREATININE 0.60 0.81 0.92  CALCIUM 8.8 8.8* 8.6*  GFRNONAA  --  >60 >60    HEMOGLOBIN A1C Lab Results  Component Value Date   HGBA1C 12.6 (A) 04/12/2022   MPG 286.22 12/21/2021    IMPRESSION:    ICD-10-CM   1. PFO with atrial septal aneurysm  Q21.12 EKG 12-Lead   I25.3 LONG TERM MONITOR (3-14 DAYS)    2. Precordial pain  R07.2 CT CARDIAC SCORING ([facility] LOCATIONS ONLY)    [facility] MYOCARDIAL PERFUSION WO LEXISCAN    3. Cerebrovascular accident (CVA), unspecified mechanism ([place])  I63.9 LONG TERM MONITOR (3-14 DAYS)    4. Palpitations  R00.2 LONG TERM MONITOR (3-14 DAYS)    5. Essential hypertension  I10     6. Type 2 diabetes mellitus with hyperglycemia, with long-term current use of insulin (HCC)  E11.65 CT CARDIAC SCORING ([facility] LOCATIONS ONLY)   Z79.4 PCV MYOCARDIAL PERFUSION WO LEXISCAN    7. Dyslipidemia  E78.5  8. Continuous dependence on cigarette smoking  F17.210     9. Class 3 severe obesity due to excess calories with serious comorbidity and body mass index (BMI) of 40.0 to 44.9 in adult  [facility])  E66.01    Z68.41        RECOMMENDATIONS: Charlyn Vialpando is a 47 y.o. 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  PFO with atrial septal aneurysm Referred to the practice for evaluation and management of PFO. Given the fact that she has had a recent stroke in June 2023 with imaging consistent with left occipital region and right corona radiata we discussed management of PFO closure as well as evaluation of atrial fibrillation. She has had a TEE and a transcranial Doppler both suggestive of small PFO. Will discuss with interventional colleague Dr. Einar Gip with regards to the need for repeat TEE versus scheduling for PFO closure.  Will discuss at the next office visit.  Precordial pain Symptoms of both cardiac and noncardiac features. EKG nonischemic. Echo from June 2023 illustrates preserved LVEF without any significant valvular heart disease. Coronary calcium score for further restratification given her multiple cardiovascular risk factors. Exercise nuclear stress test to evaluate for functional capacity and reversible ischemia Educated on seeking medical attention sooner by going to the closest ER via EMS if the symptoms increase in intensity, frequency, duration, or has typical chest pain as discussed in the office.  Patient verbalized understanding.  Cerebrovascular accident (CVA) Indexed event in June 2023 imaging findings consistent with left occipital stroke and right-sided visual deficits per patient.  In addition, imaging study also noted prior strokes in the right corona radiata region.  Prior to proceeding with PFO closure would like to evaluate her for possible atrial fibrillation with a Zio patch for 2 weeks.  If the Zio  patch is unremarkable consider loop recorder implantation if patient agreeable.  Of note independently reviewed the images from the TEE from June 2023 which does note smoke within the left atrium as well as the left atrial appendage. Educated her on the importance secondary prevention.  Essential hypertension Office blood pressure is well-controlled. Medications reconciled. Reemphasized the importance of a low-salt diet.  Type 2 diabetes mellitus with hyperglycemia, with long-term current use of insulin (HCC) Last hemoglobin A1c documented to be approximately 10. Currently being managed by primary team. Reemphasized importance of glycemic control. Continue statin therapy, ARB, Farxiga, fenofibrate, metformin  Dyslipidemia Currently on atorvastatin as well as fenofibrate.   She denies myalgia or other side effects. Recommend a goal LDL of less than 70 mg/dL at least. Currently managed by primary care provider.  Class 3 severe obesity due to excess calories with serious comorbidity and body mass index (BMI) of 40.0 to 44.9 in adult [facility]) Body mass index is 43.6 kg/m. I reviewed with the patient the importance of diet, regular physical activity/exercise, weight loss.   Patient is educated on increasing physical activity gradually as tolerated.  With the goal of moderate intensity exercise for 30 minutes a day 5 days a week.  Patient wants clearance for gastric sleeve.  I encouraged her on the importance of improving her modifiable cardiovascular risk factors, weight loss, increasing physical activity as tolerated, and considering medication such as Ozempic/Mounjaro/Rybelsus.  Patient states that she was on Ozempic for about a month but had to  discontinue due to it being cost prohibitive and not covered by insurance.  Will have the office look into coverage for and reconsider the medication.  After implementing these changes as she still not at her ideal goal corrective surgery could be  considered.  He is agreeable with the plan of care.  Data Reviewed: I have independently reviewed external notes provided by the referring provider as part of this office visit.   I have independently reviewed results of prior discharge summary, TEE report/image, transcranial Doppler, EKG, labs, CT reports, MRI reports as part of medical decision making. I have ordered the following tests:  Orders Placed This Encounter  Procedures   CT CARDIAC SCORING ([facility] LOCATIONS ONLY)    MCD epic WT 262 No caffeine 24 hrs prior/No exercise 6 hrs prior  No Special needs/No spinal cord stimulator/body injector/glucose monitor/port attached) PT aware of $75 cance/No show fee Req interpreter to Pam/Sara/Miriam-[facility]    Standing Status:   Future    Standing Expiration Date:   07/08/2023    Order Specific Question:   Preferred imaging location?    Answer:   [facility]    Order Specific Question:   Release to patient    Answer:   Immediate    Order Specific Question:   Is patient pregnant?    Answer:   No    Comments:   tubal ligation   PCV MYOCARDIAL PERFUSION WO LEXISCAN    Standing Status:   Future    Standing Expiration Date:   07/08/2023   LONG TERM MONITOR (3-14 DAYS)    Standing Status:   Future    Number of Occurrences:   1    Order Specific Question:   Where should this test be performed?    Answer:   [facility]    Order Specific Question:   Does the patient have an implanted cardiac device?    Answer:   No    Order Specific Question:   Prescribed days of wear    Answer:   34    Order Specific Question:   Type of enrollment    Answer:   Clinic Enrollment   EKG 12-Lead  I have made no medications changes at today's encounter as noted above. Translation services provided during today.   FINAL MEDICATION LIST END OF ENCOUNTER: No orders of the defined types were placed in this encounter.   Medications Discontinued During This Encounter  Medication Reason   polyethylene glycol  (MIRALAX / GLYCOLAX) 17 g packet    sorbitol 70 % SOLN      Current Outpatient Medications:    albuterol (VENTOLIN HFA) 108 (90 Base) MCG/ACT inhaler, Inhale 2 puffs into the lungs every 6 (six) hours as needed for wheezing or shortness of breath., Disp: 18 g, Rfl: 1   atorvastatin (LIPITOR) 40 MG tablet, Take 1 tablet (40 mg total) by mouth at bedtime., Disp: 90 tablet, Rfl: 1   blood glucose meter kit and supplies KIT, Use up to four times daily as directed., Disp: 1 each, Rfl: 11   Blood Glucose Monitoring Suppl (TRUE METRIX METER) w/Device KIT, Use as directed, Disp: 1 kit, Rfl: 0   Blood Glucose Monitoring Suppl (TRUE METRIX METER) w/Device KIT, use up to 4 times daily as directed, Disp: 1 kit, Rfl: 0   dapagliflozin propanediol (FARXIGA) 10 MG TABS tablet, Take 1 tablet (10 mg total) by mouth daily., Disp: 30 tablet, Rfl: 3   fenofibrate 160 MG tablet, Take 1 tablet (160 mg total)  by mouth daily., Disp: 30 tablet, Rfl: 1   gabapentin (NEURONTIN) 300 MG capsule, Take 1 capsule (300 mg total) by mouth 3 (three) times daily., Disp: 270 capsule, Rfl: 1   glipiZIDE (GLUCOTROL) 10 MG tablet, Take 1 tablet (10 mg total) by mouth 2 (two) times daily., Disp: 180 tablet, Rfl: 1   glucose blood (TRUE METRIX BLOOD GLUCOSE TEST) test strip, Use as instructed, Disp: 100 each, Rfl: 12   glucose blood test strip, use as directed, Disp: 100 each, Rfl: 0   hydrocortisone cream 0.5 %, Apply 1 Application topically 2 (two) times daily., Disp: 30 g, Rfl: 0   insulin aspart (NOVOLOG FLEXPEN) 100 UNIT/ML FlexPen, Inject 14 Units into the skin 3 (three) times daily with meals., Disp: 9 mL, Rfl: 3   Insulin Pen Needle (TRUEPLUS 5-BEVEL PEN NEEDLES) 32G X 4 MM MISC, USE AS DIRECTED, Disp: 100 each, Rfl: 3   Lancets MISC, use as directed, Disp: 100 each, Rfl: 0   linaclotide (LINZESS) 145 MCG CAPS capsule, Take 1 capsule (145 mcg total) by mouth daily before breakfast., Disp: 90 capsule, Rfl: 1   losartan (COZAAR)  25 MG tablet, TAKE 1 TABLET (25 MG TOTAL) BY MOUTH DAILY., Disp: 90 tablet, Rfl: 1   omeprazole (PRILOSEC) 20 MG capsule, Take 1 capsule (20 mg total) by mouth daily., Disp: 90 capsule, Rfl: 1   Semaglutide,0.25 or 0.5MG/DOS, (OZEMPIC, 0.25 OR 0.5 MG/DOSE,) 2 MG/3ML SOPN, Inject 0.25 mg into the skin once a week., Disp: 3 mL, Rfl: 6   Insulin Glargine (BASAGLAR KWIKPEN) 100 UNIT/ML, Inject 60 Units into the skin daily., Disp: 18 mL, Rfl: 2   metFORMIN (GLUCOPHAGE) 500 MG tablet, Take 2 tablets (1,000 mg total) by mouth 2 (two) times daily with a meal., Disp: 120 tablet, Rfl: 2  Orders Placed This Encounter  Procedures   CT CARDIAC SCORING ([facility] LOCATIONS ONLY)   PCV MYOCARDIAL PERFUSION WO LEXISCAN   LONG TERM MONITOR (3-14 DAYS)   EKG 12-Lead    There are no Patient Instructions on file for this visit.   --Continue cardiac medications as reconciled in final medication list. --Return in about 6 weeks (around 08/18/2022) for Follow up, Chest pain,s/p stroke. or sooner if needed. --Continue follow-up with your primary care physician regarding the management of your other chronic comorbid conditions.  Patient's questions and concerns were addressed to her satisfaction. She voices understanding of the instructions provided during this encounter.   This note was created using a voice recognition software as a result there may be grammatical errors inadvertently enclosed that do not reflect the nature of this encounter. Every attempt is made to correct such errors.  Rex Kras, [place], [facility]  Pager: [phone] Office: [phone]

## 2022-07-07 NOTE — Telephone Encounter (Signed)
Requested Prescriptions  Pending Prescriptions Disp Refills   metFORMIN (GLUCOPHAGE) 500 MG tablet 120 tablet 2    Sig: Take 2 tablets (1,000 mg total) by mouth 2 (two) times daily with a meal.     Endocrinology:  Diabetes - Biguanides Failed - 07/07/2022  5:06 PM      Failed - HBA1C is between 0 and 7.9 and within 180 days    HbA1c, POC (prediabetic range)  Date Value Ref Range Status  03/22/2018 10.9 (A) 5.7 - 6.4 % Final   HbA1c, POC (controlled diabetic range)  Date Value Ref Range Status  04/12/2022 12.6 (A) 0.0 - 7.0 % Final         Failed - B12 Level in normal range and within 720 days    No results found for: "VITAMINB12"       Passed - Cr in normal range and within 360 days    Creat  Date Value Ref Range Status  08/25/2016 0.69 0.50 - 1.10 mg/dL Final   Creatinine, Ser  Date Value Ref Range Status  12/22/2021 0.92 0.44 - 1.00 mg/dL Final   Creatinine, Urine  Date Value Ref Range Status  08/25/2016 208 20 - 320 mg/dL Final         Passed - eGFR in normal range and within 360 days    GFR, Est African American  Date Value Ref Range Status  08/25/2016 >89 >=60 mL/min Final   GFR calc Af Amer  Date Value Ref Range Status  05/10/2019 >60 >60 mL/min Final   GFR, Est Non African American  Date Value Ref Range Status  08/25/2016 >89 >=60 mL/min Final   GFR, Estimated  Date Value Ref Range Status  12/22/2021 >60 >60 mL/min Final    Comment:    (NOTE) Calculated using the CKD-EPI Creatinine Equation (2021)    eGFR  Date Value Ref Range Status  08/30/2021 112 >59 mL/min/1.73 Final         Passed - Valid encounter within last 6 months    Recent Outpatient Visits           2 months ago Type 2 diabetes mellitus with other specified complication, with long-term current use of insulin ([place])   [place], Jenkins, MD   10 months ago Uncontrolled type 2 diabetes mellitus with hyperglycemia, with long-term current use of  insulin ([place])   [place], Michelle P, NP   1 year ago Uncontrolled type 2 diabetes mellitus with hyperglycemia, with long-term current use of insulin ([place])   [place], Michelle P, NP   1 year ago Cervical cancer screening   [place], Michelle P, NP   1 year ago Uncontrolled type 2 diabetes mellitus with hyperglycemia, with long-term current use of insulin ([place])   [place], [place], NP       Future Appointments             In 6 days Donato Heinz, MD [place] [facility] [place]. [place]   In 6 days Charlott Rakes, MD [place]   In 1 month [place], [place], [facility]            Passed - CBC within normal limits and completed in the last 12 months    WBC  Date Value Ref Range Status  12/22/2021 6.9 4.0 - 10.5 K/uL Final   RBC  Date Value Ref Range Status  12/22/2021 4.20 3.87 - 5.11 MIL/uL Final   Hemoglobin  Date Value Ref Range Status  12/22/2021 9.5 (L) 12.0 - 15.0 g/dL Final  08/30/2021 11.0 (L) 11.1 - 15.9 g/dL Final   HCT  Date Value Ref Range Status  12/22/2021 30.4 (L) 36.0 - 46.0 % Final   Hematocrit  Date Value Ref Range Status  08/30/2021 36.8 34.0 - 46.6 % Final   MCHC  Date Value Ref Range Status  12/22/2021 31.3 30.0 - 36.0 g/dL Final   [facility]  Date Value Ref Range Status  12/22/2021 22.6 (L) 26.0 - 34.0 pg Final   MCV  Date Value Ref Range Status  12/22/2021 72.4 (L) 80.0 - 100.0 fL Final  08/30/2021 76 (L) 79 - 97 fL Final   No results found for: "PLTCOUNTKUC", "LABPLAT", "POCPLA" RDW  Date Value Ref Range Status  12/22/2021 15.6 (H) 11.5 - 15.5 % Final  08/30/2021 15.2 11.7 - 15.4 % Final          Insulin Glargine (BASAGLAR KWIKPEN) 100 UNIT/ML 18 mL 2    Sig: Inject 60 Units into the skin daily.      Endocrinology:  Diabetes - Insulins Failed - 07/07/2022  5:06 PM      Failed - HBA1C is between 0 and 7.9 and within 180 days    HbA1c, POC (prediabetic range)  Date Value Ref Range Status  03/22/2018 10.9 (A) 5.7 - 6.4 % Final   HbA1c, POC (controlled diabetic range)  Date Value Ref Range Status  04/12/2022 12.6 (A) 0.0 - 7.0 % Final         Passed - Valid encounter within last 6 months    Recent Outpatient Visits           2 months ago Type 2 diabetes mellitus with other specified complication, with long-term current use of insulin ([place])   [place], [place], MD   10 months ago Uncontrolled type 2 diabetes mellitus with hyperglycemia, with long-term current use of insulin ([place])   [place], Michelle P, NP   1 year ago Uncontrolled type 2 diabetes mellitus with hyperglycemia, with long-term current use of insulin ([place])   [place] [facility] Kerin Perna, NP   1 year ago Cervical cancer screening   [place], Michelle P, NP   1 year ago Uncontrolled type 2 diabetes mellitus with hyperglycemia, with long-term current use of insulin ([place])   [place], [place], NP       Future Appointments             In 6 days Donato Heinz, MD [place] [facility] [place]. [facility]   In 6 days Charlott Rakes, MD [place]   In 1 month [place], [place], [place] [facility]

## 2022-07-08 ENCOUNTER — Other Ambulatory Visit: Payer: Self-pay

## 2022-07-08 ENCOUNTER — Ambulatory Visit: Payer: Medicaid Other | Admitting: Internal Medicine

## 2022-07-08 ENCOUNTER — Other Ambulatory Visit: Payer: Self-pay | Admitting: Pharmacist

## 2022-07-08 MED ORDER — EMPAGLIFLOZIN 25 MG PO TABS
25.0000 mg | ORAL_TABLET | Freq: Every day | ORAL | 3 refills | Status: DC
  Filled 2022-07-08: qty 30, 30d supply, fill #0
  Filled 2022-08-30: qty 30, 30d supply, fill #1

## 2022-07-13 ENCOUNTER — Other Ambulatory Visit: Payer: Self-pay

## 2022-07-13 ENCOUNTER — Ambulatory Visit: Payer: Medicaid Other | Attending: Family Medicine | Admitting: Family Medicine

## 2022-07-13 ENCOUNTER — Ambulatory Visit: Payer: Medicaid Other | Admitting: Cardiology

## 2022-07-13 ENCOUNTER — Encounter: Payer: Self-pay | Admitting: Family Medicine

## 2022-07-13 VITALS — BP 118/77 | HR 72 | Ht 65.0 in | Wt 262.0 lb

## 2022-07-13 DIAGNOSIS — M25512 Pain in left shoulder: Secondary | ICD-10-CM

## 2022-07-13 DIAGNOSIS — E785 Hyperlipidemia, unspecified: Secondary | ICD-10-CM

## 2022-07-13 DIAGNOSIS — K5909 Other constipation: Secondary | ICD-10-CM | POA: Diagnosis not present

## 2022-07-13 DIAGNOSIS — Z794 Long term (current) use of insulin: Secondary | ICD-10-CM

## 2022-07-13 DIAGNOSIS — E1169 Type 2 diabetes mellitus with other specified complication: Secondary | ICD-10-CM | POA: Diagnosis not present

## 2022-07-13 DIAGNOSIS — Z8673 Personal history of transient ischemic attack (TIA), and cerebral infarction without residual deficits: Secondary | ICD-10-CM

## 2022-07-13 DIAGNOSIS — M25561 Pain in right knee: Secondary | ICD-10-CM

## 2022-07-13 LAB — POCT GLYCOSYLATED HEMOGLOBIN (HGB A1C): HbA1c, POC (controlled diabetic range): 10.4 % — AB (ref 0.0–7.0)

## 2022-07-13 LAB — GLUCOSE, POCT (MANUAL RESULT ENTRY): POC Glucose: 245 mg/dl — AB (ref 70–99)

## 2022-07-13 MED ORDER — LINACLOTIDE 145 MCG PO CAPS
145.0000 ug | ORAL_CAPSULE | Freq: Every day | ORAL | 1 refills | Status: DC
  Filled 2022-07-13 – 2022-10-12 (×3): qty 90, 90d supply, fill #0
  Filled 2023-01-12: qty 90, 90d supply, fill #1

## 2022-07-13 MED ORDER — FENOFIBRATE 160 MG PO TABS
160.0000 mg | ORAL_TABLET | Freq: Every day | ORAL | 1 refills | Status: DC
  Filled 2022-07-13 – 2022-08-05 (×2): qty 90, 90d supply, fill #0

## 2022-07-13 MED ORDER — TRULICITY 1.5 MG/0.5ML ~~LOC~~ SOAJ
1.5000 mg | SUBCUTANEOUS | 3 refills | Status: DC
  Filled 2022-07-13 – 2022-08-05 (×2): qty 2, 28d supply, fill #0

## 2022-07-13 MED ORDER — GABAPENTIN 300 MG PO CAPS
300.0000 mg | ORAL_CAPSULE | Freq: Three times a day (TID) | ORAL | 1 refills | Status: DC
  Filled 2022-07-13 – 2022-08-05 (×2): qty 270, 90d supply, fill #0
  Filled 2022-11-01 – 2022-11-07 (×2): qty 270, 90d supply, fill #1

## 2022-07-13 MED ORDER — TIZANIDINE HCL 4 MG PO TABS
4.0000 mg | ORAL_TABLET | Freq: Three times a day (TID) | ORAL | 1 refills | Status: DC | PRN
  Filled 2022-07-13: qty 60, 20d supply, fill #0
  Filled 2022-08-30: qty 60, 20d supply, fill #1

## 2022-07-13 MED ORDER — TRULICITY 0.75 MG/0.5ML ~~LOC~~ SOAJ
0.7500 mg | SUBCUTANEOUS | 0 refills | Status: DC
  Filled 2022-07-13: qty 2, 28d supply, fill #0

## 2022-07-13 NOTE — Patient Instructions (Signed)
Dolor en el hombro Shoulder Pain Muchas cosas pueden provocar dolor en el hombro, por ejemplo: Una lesin en el hombro. El uso excesivo del hombro. Artritis. La causa del dolor puede ser lo siguiente: Inflamacin. Una lesin en la articulacin del hombro. Una lesin en un tendn, ligamento o hueso. Siga estas indicaciones en su casa: Est atento a los [employer] sntomas. Informe a su mdico acerca de cualquier cambio. Siga estas indicaciones para Best boy. Si tiene un cabestrillo: selo como se lo haya indicado el mdico. Quteselo solamente como se lo haya indicado el mdico. Afloje el cabestrillo si los dedos de la mano se le adormecen, siente hormigueo o se le enfran y se ponen azules. Mantenga el cabestrillo limpio. Si el cabestrillo no es impermeable: No deje que se moje. Quteselo para ducharse o para baarse. Mueva el brazo lo menos posible, pero mantenga la mano en movimiento para evitar la hinchazn. Control del dolor, la rigidez y la hinchazn  Si se lo indican, aplique hielo sobre la zona del dolor: Ponga el hielo en una bolsa plstica. Coloque una toalla entre la piel y Therapist, nutritional. Coloque el hielo durante 20 minutos, 2 a 3 veces al da. Deje de aplicar hielo si no ayuda a Best boy. Apriete una pelota blanda o una almohadilla de goma tanto como sea posible. Esto ayuda e prevenir la hinchazn en el hombro. Tambin ayuda a Veterinary surgeon. Indicaciones generales Use los medicamentos de venta libre y los recetados solamente como se lo haya indicado el mdico. Consulting civil engineer a todas las visitas de seguimiento como se lo haya indicado el mdico. Esto es importante. Comunquese con un mdico si: El Holiday representative. El dolor no se alivia con los [employer]. Aparece un dolor nuevo en el brazo, la mano o los dedos. Solicite ayuda inmediatamente si: El brazo, la mano o los dedos: [place]. Se adormecen. Se hinchan. Duelen. Se tornan de color blanco o  azul. Resumen La causa del dolor en el hombro puede ser una lesin, el uso excesivo o la artritis. Est atento a los [employer]. Informe a su mdico acerca de cualquier cambio. Esta afeccin se puede tratar con un cabestrillo, hielo y medicamentos para Conservation officer, historic buildings. Comunquese con su mdico si el dolor empeora o tiene un dolor nuevo. Solicite ayuda de inmediato si el brazo, la mano o los dedos se le adormecen o si siente hormigueo, se le hinchan o le duelen. Concurra a todas las visitas de seguimiento como se lo haya indicado el mdico. Esto es importante. Esta informacin no tiene Marine scientist el consejo del mdico. Asegrese de hacerle al mdico cualquier pregunta que tenga. Document Revised: 03/09/2021 Document Reviewed: 03/09/2021 Elsevier Patient Education  [place].

## 2022-07-13 NOTE — Progress Notes (Signed)
Discuss Ozempic medication Fall 2 weeks ago pain in right knee and left shoulder.

## 2022-07-13 NOTE — Progress Notes (Signed)
Subjective:  Patient ID: Julie Robertson, female    DOB: 03/29/1976  Age: 47 y.o. MRN: [id number]  CC: Diabetes   HPI Alaura Schippers is a 47 y.o. year old female with a history of left occipital lobe stroke in 12/2021, PFO, type 2 diabetes mellitus (A1c 10.4), dyslipidemia, GERD, nicotine dependence   Interval History:  She states she only received Ozempic for one month and has been without it for 2 months.  The pharmacy informed her insurance would not pay for it.  She has been adherent with Basaglar, and NovoLog, metformin, Jardiance, glipizide. She has had no visual concerns, neuropathy or hypoglycemia.  Not up-to-date on annual eye exam. Endorses adherence with her statin. Doing well on her antihypertensive.  She fell 2 weeks ago and now has pain in her right knee and left shoulder. She sustained some bruising which has scabbed over. Left shouler hurts more but knee only hurts when she touches it.  She did see cardiology last week for follow-up of her PFO with atrial septal aneurysm.  Plan to evaluate with Zio patch for 2 weeks per notes due to CVA from 12/2021 Past Medical History:  Diagnosis Date   Anxiety    Asthma    CVA (cerebral vascular accident) ([place])    Diabetes mellitus without complication ([place])    Diabetic neuropathy ([place]) 12/2013   vs carpal tunnell.  numbness tingling in right fingers. rx with Gabapentin.   Dyslipidemia 08/2009   Dyspnea    Gall stones 08/2009   GERD (gastroesophageal reflux disease)    Obesity    BMI 41, 250# 03/2015    Past Surgical History:  Procedure Laterality Date   APPLICATION OF WOUND VAC N/A 05/09/2019   Procedure: APPLICATION OF WOUND VAC;  Surgeon: Olean Ree, MD;  Location: [facility];  Service: General;  Laterality: N/A;  [id number]   BUBBLE STUDY  12/24/2021   Procedure: BUBBLE STUDY;  Surgeon: Lelon Perla, MD;  Location: [place];  Service: Cardiovascular;;   CHOLECYSTECTOMY N/A 03/10/2015    Procedure: LAPAROSCOPIC CHOLECYSTECTOMY WITH INTRAOPERATIVE CHOLANGIOGRAM;  Surgeon: Georganna Skeans, MD;  Location: [place];  Service: General;  Laterality: N/A;   ERCP N/A 03/11/2015   Procedure: ENDOSCOPIC RETROGRADE CHOLANGIOPANCREATOGRAPHY (ERCP);  Surgeon: Milus Banister, MD;  Location: [place];  Service: Endoscopy;  Laterality: N/A;   ESOPHAGOGASTRODUODENOSCOPY (EGD) WITH PROPOFOL N/A 07/25/2018   Procedure: ESOPHAGOGASTRODUODENOSCOPY (EGD) WITH PROPOFOL;  Surgeon: Rush Landmark Telford Nab., MD;  Location: [facility];  Service: Gastroenterology;  Laterality: N/A;   INSERTION OF MESH N/A 05/09/2019   Procedure: INSERTION OF MESH;  Surgeon: Olean Ree, MD;  Location: [facility];  Service: General;  Laterality: N/A;   TEE WITHOUT CARDIOVERSION N/A 12/24/2021   Procedure: TRANSESOPHAGEAL ECHOCARDIOGRAM (TEE);  Surgeon: Lelon Perla, MD;  Location: [place];  Service: Cardiovascular;  Laterality: N/A;   Transduodenal Ampullectomy  09/2015   TUBAL LIGATION     VENTRAL HERNIA REPAIR N/A 05/09/2019   Procedure: HERNIA REPAIR VENTRAL ADULT with MESH;  Surgeon: Olean Ree, MD;  Location: [facility];  Service: General;  Laterality: N/A;    Family History  Adopted: Yes  Problem Relation Age of Onset   Diabetes Mother    Heart disease Mother    Hypertension Mother    Heart attack Father    Diabetes Father    Kidney disease Maternal Uncle    Bone cancer Maternal Grandfather    Other Son        had kidney  removed due to gun shot wound    Social History   Socioeconomic History   Marital status: Married    Spouse name: Not on file   Number of children: 2   Years of education: Not on file   Highest education level: Not on file  Occupational History   Occupation: house cleaning  Tobacco Use   Smoking status: Former    Packs/day: 1.50    Years: 4.00    Total pack years: 6.00    Types: Cigarettes   Smokeless tobacco: Never  Vaping Use   Vaping Use: Never used  Substance  and Sexual Activity   Alcohol use: Yes    Comment: rare   Drug use: No   Sexual activity: Not on file  Other Topics Concern   Not on file  Social History Narrative   Patient has 2 sons one is 62, the other 44 as of 03/2015. Her kids are not the children of her current husband. As of 03/2015 she is not employed outside the home.   Social Determinants of Health   Financial Resource Strain: Not on file  Food Insecurity: Not on file  Transportation Needs: Not on file  Physical Activity: Not on file  Stress: Not on file  Social Connections: Not on file    No Known Allergies  Outpatient Medications Prior to Visit  Medication Sig Dispense Refill   albuterol (VENTOLIN HFA) 108 (90 Base) MCG/ACT inhaler Inhale 2 puffs into the lungs every 6 (six) hours as needed for wheezing or shortness of breath. 18 g 1   atorvastatin (LIPITOR) 40 MG tablet Take 1 tablet (40 mg total) by mouth at bedtime. 90 tablet 1   blood glucose meter kit and supplies KIT Use up to four times daily as directed. 1 each 11   Blood Glucose Monitoring Suppl (TRUE METRIX METER) w/Device KIT Use as directed 1 kit 0   Blood Glucose Monitoring Suppl (TRUE METRIX METER) w/Device KIT use up to 4 times daily as directed 1 kit 0   empagliflozin (JARDIANCE) 25 MG TABS tablet Take 1 tablet (25 mg total) by mouth daily before breakfast. 30 tablet 3   glipiZIDE (GLUCOTROL) 10 MG tablet Take 1 tablet (10 mg total) by mouth 2 (two) times daily. 180 tablet 1   glucose blood (TRUE METRIX BLOOD GLUCOSE TEST) test strip Use as instructed 100 each 12   glucose blood test strip use as directed 100 each 0   hydrocortisone cream 0.5 % Apply 1 Application topically 2 (two) times daily. 30 g 0   insulin aspart (NOVOLOG FLEXPEN) 100 UNIT/ML FlexPen Inject 14 Units into the skin 3 (three) times daily with meals. 9 mL 3   Insulin Glargine (BASAGLAR KWIKPEN) 100 UNIT/ML Inject 60 Units into the skin daily. 18 mL 2   Insulin Pen Needle (TRUEPLUS  5-BEVEL PEN NEEDLES) 32G X 4 MM MISC USE AS DIRECTED 100 each 3   Lancets MISC use as directed 100 each 0   losartan (COZAAR) 25 MG tablet TAKE 1 TABLET (25 MG TOTAL) BY MOUTH DAILY. 90 tablet 1   metFORMIN (GLUCOPHAGE) 500 MG tablet Take 2 tablets (1,000 mg total) by mouth 2 (two) times daily with a meal. 120 tablet 2   omeprazole (PRILOSEC) 20 MG capsule Take 1 capsule (20 mg total) by mouth daily. 90 capsule 1   fenofibrate 160 MG tablet Take 1 tablet (160 mg total) by mouth daily. 30 tablet 1   gabapentin (NEURONTIN) 300 MG capsule Take  1 capsule (300 mg total) by mouth 3 (three) times daily. 270 capsule 1   linaclotide (LINZESS) 145 MCG CAPS capsule Take 1 capsule (145 mcg total) by mouth daily before breakfast. 90 capsule 1   Semaglutide,0.25 or 0.'5MG'$ /DOS, (OZEMPIC, 0.25 OR 0.5 MG/DOSE,) 2 MG/3ML SOPN Inject 0.25 mg into the skin once a week. 3 mL 6   No facility-administered medications prior to visit.     ROS Review of Systems  Constitutional:  Negative for activity change and appetite change.  HENT:  Negative for sinus pressure and sore throat.   Respiratory:  Negative for chest tightness, shortness of breath and wheezing.   Cardiovascular:  Negative for chest pain and palpitations.  Gastrointestinal:  Negative for abdominal distention, abdominal pain and constipation.  Genitourinary: Negative.   Musculoskeletal:        See HPI  Psychiatric/Behavioral:  Negative for behavioral problems and dysphoric mood.     Objective:  BP 118/77   Pulse 72   Ht '5\' 5"'$  (1.651 m)   Wt 262 lb (118.8 kg)   SpO2 100%   BMI 43.60 kg/m      07/13/2022    3:04 PM 07/07/2022    2:28 PM 04/12/2022    2:00 PM  BP/Weight  Systolic BP 585 277 824  Diastolic BP 77 72 82  Wt. (Lbs) 262 262 259  BMI 43.6 kg/m2 43.6 kg/m2 43.1 kg/m2      Physical Exam Constitutional:      Appearance: She is well-developed. She is obese.  Cardiovascular:     Rate and Rhythm: Normal rate.     Heart sounds:  Normal heart sounds. No murmur heard. Pulmonary:     Effort: Pulmonary effort is normal.     Breath sounds: Normal breath sounds. No wheezing or rales.  Chest:     Chest wall: No tenderness.  Abdominal:     General: Bowel sounds are normal. There is no distension.     Palpations: Abdomen is soft. There is no mass.     Tenderness: There is no abdominal tenderness.  Musculoskeletal:        General: Normal range of motion.     Right lower leg: No edema.     Left lower leg: No edema.     Comments: Tenderness on forward elevation of left upper extremity Tenderness on palpation of left shoulder joint anteriorly  Skin:    Comments: Bruises with scabs over right knee and anterior lateral region.  Slight erythema on lateral aspect of right knee  Neurological:     Mental Status: She is alert and oriented to person, place, and time.  Psychiatric:        Mood and Affect: Mood normal.        Latest Ref Rng & Units 12/22/2021    3:14 AM 12/20/2021    4:16 PM 08/30/2021   11:15 AM  CMP  Glucose 70 - 99 mg/dL 285  216  269   BUN 6 - 20 mg/dL '16  16  12   '$ Creatinine 0.44 - 1.00 mg/dL 0.92  0.81  0.60   Sodium 135 - 145 mmol/L 136  136  136   Potassium 3.5 - 5.1 mmol/L 4.0  3.8  4.2   Chloride 98 - 111 mmol/L 106  103  99   CO2 22 - 32 mmol/L '23  20  21   '$ Calcium 8.9 - 10.3 mg/dL 8.6  8.8  8.8   Total Protein 6.5 - 8.1 g/dL  5.9   6.3   Total Bilirubin 0.3 - 1.2 mg/dL 0.4   <0.2   Alkaline Phos 38 - 126 U/L 56   83   AST 15 - 41 U/L 37   59   ALT 0 - 44 U/L 33   61     Lipid Panel     Component Value Date/Time   CHOL 120 12/21/2021 0925   CHOL 217 (H) 08/30/2021 1115   TRIG 224 (H) 12/21/2021 0925   HDL 20 (L) 12/21/2021 0925   HDL 26 (L) 08/30/2021 1115   CHOLHDL 6.0 12/21/2021 0925   VLDL 45 (H) 12/21/2021 0925   LDLCALC 55 12/21/2021 0925   LDLCALC 121 (H) 08/30/2021 1115    CBC    Component Value Date/Time   WBC 6.9 12/22/2021 0314   RBC 4.20 12/22/2021 0314   HGB 9.5  (L) 12/22/2021 0314   HGB 11.0 (L) 08/30/2021 1115   HCT 30.4 (L) 12/22/2021 0314   HCT 36.8 08/30/2021 1115   PLT 305 12/22/2021 0314   PLT 326 08/30/2021 1115   MCV 72.4 (L) 12/22/2021 0314   MCV 76 (L) 08/30/2021 1115   MCH 22.6 (L) 12/22/2021 0314   MCHC 31.3 12/22/2021 0314   RDW 15.6 (H) 12/22/2021 0314   RDW 15.2 08/30/2021 1115   LYMPHSABS 3.5 12/20/2021 1616   LYMPHSABS 2.1 08/30/2021 1115   MONOABS 0.5 12/20/2021 1616   EOSABS 0.4 12/20/2021 1616   EOSABS 0.4 08/30/2021 1115   BASOSABS 0.0 12/20/2021 1616   BASOSABS 0.0 08/30/2021 1115    Lab Results  Component Value Date   HGBA1C 10.4 (A) 07/13/2022    Assessment & Plan:  1. Type 2 diabetes mellitus with other specified complication, with long-term current use of insulin ([place]) Uncontrolled with A1c of 10.4 I have spoken with the pharmacy and verified that Ozempic is not covered by insurance but Trulicity is.  Prescription for Trulicity sent to the pharmacy. She will follow-up with the clinical pharmacist in 1 month to ensure she uptitrate her dose of Trulicity and we will titrate this up to the maximum tolerated dose. Continue Basaglar, metformin, Jardiance, glipizide - POCT glucose (manual entry) - POCT glycosylated hemoglobin (Hb A1C) - Ambulatory referral to Ophthalmology - CMP14+EGFR - gabapentin (NEURONTIN) 300 MG capsule; Take 1 capsule (300 mg total) by mouth 3 (three) times daily.  Dispense: 270 capsule; Refill: 1 - Dulaglutide (TRULICITY) 1.5 UX/3.2TF SOPN; Inject 1.5 mg into the skin once a week.  Dispense: 2 mL; Refill: 3 - Dulaglutide (TRULICITY) 5.73 UK/0.2RK SOPN; Inject 0.75 mg into the skin once a week. For 4 weeks then increase to 1.5 mg thereafter  Dispense: 2 mL; Refill: 0  2. Acute pain of right knee Secondary to trauma Advised to apply ice  3. Acute pain of left shoulder Secondary to trauma - tiZANidine (ZANAFLEX) 4 MG tablet; Take 1 tablet (4 mg total) by mouth every 8 (eight) hours as  needed for muscle spasms.  Dispense: 60 tablet; Refill: 1  4. Chronic constipation Controlled - linaclotide (LINZESS) 145 MCG CAPS capsule; Take 1 capsule (145 mcg total) by mouth daily before breakfast.  Dispense: 90 capsule; Refill: 1  5. Hyperlipidemia associated with type 2 diabetes mellitus ([place]) LDL of 55 which is at goal She is currently on a statin Continue fenofibrate for hypertriglyceridemia - fenofibrate 160 MG tablet; Take 1 tablet (160 mg total) by mouth daily.  Dispense: 90 tablet; Refill: 1  6. History of stroke No residual  deficits Secondary risk factor modification Continue high intensity statin, aspirin Continue cardiac workup for cardiac etiology, Zio patch to monitor for A-fib   Meds ordered this encounter  Medications   fenofibrate 160 MG tablet    Sig: Take 1 tablet (160 mg total) by mouth daily.    Dispense:  90 tablet    Refill:  1   gabapentin (NEURONTIN) 300 MG capsule    Sig: Take 1 capsule (300 mg total) by mouth 3 (three) times daily.    Dispense:  270 capsule    Refill:  1   linaclotide (LINZESS) 145 MCG CAPS capsule    Sig: Take 1 capsule (145 mcg total) by mouth daily before breakfast.    Dispense:  90 capsule    Refill:  1   Dulaglutide (TRULICITY) 1.5 IR/4.4RX SOPN    Sig: Inject 1.5 mg into the skin once a week.    Dispense:  2 mL    Refill:  3   Dulaglutide (TRULICITY) 5.40 GQ/6.7YP SOPN    Sig: Inject 0.75 mg into the skin once a week. For 4 weeks then increase to 1.5 mg thereafter    Dispense:  2 mL    Refill:  0    Discontinue Ozempic   tiZANidine (ZANAFLEX) 4 MG tablet    Sig: Take 1 tablet (4 mg total) by mouth every 8 (eight) hours as needed for muscle spasms.    Dispense:  60 tablet    Refill:  1    Follow-up: Return in about 1 month (around 08/13/2022) for Blood sugar evaluation with Lurena Joiner and titration of Trulicity.  PCP in 3 months.       Charlott Rakes, MD, FAAFP. [facility] and [place], [place]   07/13/2022, 6:03 PM

## 2022-07-14 ENCOUNTER — Other Ambulatory Visit: Payer: Self-pay

## 2022-07-14 ENCOUNTER — Other Ambulatory Visit: Payer: Self-pay | Admitting: Family Medicine

## 2022-07-14 DIAGNOSIS — R7989 Other specified abnormal findings of blood chemistry: Secondary | ICD-10-CM

## 2022-07-14 LAB — CMP14+EGFR
ALT: 95 IU/L — ABNORMAL HIGH (ref 0–32)
AST: 127 IU/L — ABNORMAL HIGH (ref 0–40)
Albumin/Globulin Ratio: 1.1 — ABNORMAL LOW (ref 1.2–2.2)
Albumin: 4.1 g/dL (ref 3.9–4.9)
Alkaline Phosphatase: 71 IU/L (ref 44–121)
BUN/Creatinine Ratio: 16 (ref 9–23)
BUN: 13 mg/dL (ref 6–24)
Bilirubin Total: 0.3 mg/dL (ref 0.0–1.2)
CO2: 20 mmol/L (ref 20–29)
Calcium: 9.5 mg/dL (ref 8.7–10.2)
Chloride: 103 mmol/L (ref 96–106)
Creatinine, Ser: 0.81 mg/dL (ref 0.57–1.00)
Globulin, Total: 3.7 g/dL (ref 1.5–4.5)
Glucose: 237 mg/dL — ABNORMAL HIGH (ref 70–99)
Potassium: 4.4 mmol/L (ref 3.5–5.2)
Sodium: 138 mmol/L (ref 134–144)
Total Protein: 7.8 g/dL (ref 6.0–8.5)
eGFR: 90 mL/min/{1.73_m2} (ref >59)

## 2022-07-19 ENCOUNTER — Ambulatory Visit: Payer: Medicaid Other

## 2022-07-19 ENCOUNTER — Encounter: Payer: Self-pay | Admitting: Cardiology

## 2022-07-19 DIAGNOSIS — Q2112 Aneurysm of heart: Secondary | ICD-10-CM

## 2022-07-19 DIAGNOSIS — I639 Cerebral infarction, unspecified: Secondary | ICD-10-CM

## 2022-07-19 DIAGNOSIS — R072 Precordial pain: Secondary | ICD-10-CM

## 2022-07-19 DIAGNOSIS — E1165 Type 2 diabetes mellitus with hyperglycemia: Secondary | ICD-10-CM

## 2022-07-19 DIAGNOSIS — R002 Palpitations: Secondary | ICD-10-CM

## 2022-07-20 ENCOUNTER — Telehealth: Payer: Self-pay

## 2022-07-20 ENCOUNTER — Other Ambulatory Visit: Payer: Self-pay

## 2022-07-20 DIAGNOSIS — R9439 Abnormal result of other cardiovascular function study: Secondary | ICD-10-CM

## 2022-07-20 NOTE — Telephone Encounter (Signed)
Julie Robertson was able to translate for the patient. I got the Requested labs ordered (fasting lipids, direct LDL, CMP, and CBC) and released. She does not want to go to [facility] so we sent them to her PCP per her request. We are getting her follow up appt rescheduled for Tuesday morning 07/26/22.

## 2022-07-22 LAB — COMPREHENSIVE METABOLIC PANEL
ALT: 105 IU/L — ABNORMAL HIGH (ref 0–32)
AST: 92 IU/L — ABNORMAL HIGH (ref 0–40)
Albumin/Globulin Ratio: 1.1 — ABNORMAL LOW (ref 1.2–2.2)
Albumin: 3.9 g/dL (ref 3.9–4.9)
Alkaline Phosphatase: 88 IU/L (ref 44–121)
BUN/Creatinine Ratio: 18 (ref 9–23)
BUN: 16 mg/dL (ref 6–24)
Bilirubin Total: 0.3 mg/dL (ref 0.0–1.2)
CO2: 22 mmol/L (ref 20–29)
Calcium: 9.5 mg/dL (ref 8.7–10.2)
Chloride: 101 mmol/L (ref 96–106)
Creatinine, Ser: 0.89 mg/dL (ref 0.57–1.00)
Globulin, Total: 3.7 g/dL (ref 1.5–4.5)
Glucose: 262 mg/dL — ABNORMAL HIGH (ref 70–99)
Potassium: 4.3 mmol/L (ref 3.5–5.2)
Sodium: 135 mmol/L (ref 134–144)
Total Protein: 7.6 g/dL (ref 6.0–8.5)
eGFR: 80 mL/min/{1.73_m2} (ref >59)

## 2022-07-22 LAB — LIPID PANEL WITH LDL/HDL RATIO
Cholesterol, Total: 126 mg/dL (ref 100–199)
HDL: 26 mg/dL — ABNORMAL LOW (ref >39)
LDL Chol Calc (NIH): 64 mg/dL (ref 0–99)
LDL/HDL Ratio: 2.5 ratio (ref 0.0–3.2)
Triglycerides: 218 mg/dL — ABNORMAL HIGH (ref 0–149)
VLDL Cholesterol Cal: 36 mg/dL (ref 5–40)

## 2022-07-22 LAB — CBC
Hematocrit: 37.3 % (ref 34.0–46.6)
Hemoglobin: 10.5 g/dL — ABNORMAL LOW (ref 11.1–15.9)
MCH: 20.7 pg — ABNORMAL LOW (ref 26.6–33.0)
MCHC: 28.2 g/dL — ABNORMAL LOW (ref 31.5–35.7)
MCV: 74 fL — ABNORMAL LOW (ref 79–97)
Platelets: 404 10*3/uL (ref 150–450)
RBC: 5.07 x10E6/uL (ref 3.77–5.28)
RDW: 14.9 % (ref 11.7–15.4)
WBC: 6.2 10*3/uL (ref 3.4–10.8)

## 2022-07-22 LAB — LDL CHOLESTEROL, DIRECT: LDL Direct: 64 mg/dL (ref 0–99)

## 2022-07-24 NOTE — H&P (View-Only) (Signed)
ID:  Julie Robertson, DOB September 21, 1975, MRN [id number]  PCP:  Charlott Rakes, MD  Cardiologist:  Rex Kras, DO, [facility] (established care 07/07/2022)  Date: 07/25/22 Last Office Visit: 07/07/2022  Chief Complaint  Patient presents with    Abnormal nuclear stress test   Follow-up    HPI  Julie Robertson is a 47 y.o. 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    Patient predominantly prefers to communicate in Spanish and translation services provided by medical assistance during today's encounter.  Patient was noted to have a stroke in June 2023 and workup included transesophageal echocardiogram and transcranial Doppler both of which were positive for PFO.  However due to insurance reasons was not able to follow-up after her stroke.  At the last office visit the shared decision was to proceed with a Zio patch to evaluate for possible A-fib given that prior strokes involved both the right and left hemispheres.  She is currently wearing a monitor results of forthcoming.  Shared decision was also to proceed with PFO closure if the workup is essentially unremarkable.  However, during the initial presentation patient was complaining of symptoms of precordial pain, left-sided, 7 out of 10 in intensity, better with resting and relaxing, usually brought on by stressful situations.  Given her precordial pain and multiple risk factors including prior stroke and insulin-dependent diabetes that shared decision was to proceed with exercise nuclear stress test.  Patient was asked to come in sooner as a nuclear stress test was abnormal suggestive of reversible ischemia in either LCx/RCA distribution.  Since last  office visit patient states that she has had couple episodes of precordial pain similar to her episode in December 2023.  She feels more tired and fatigued compared to her baseline and also dyspnea on exertion.  Patient stated that she stopped smoking she has gained weight exponentially.  She currently has a Event organiser..   FUNCTIONAL STATUS: No structured exercise program or daily routine.   ALLERGIES: No Known Allergies  MEDICATION LIST PRIOR TO VISIT: Current Meds  Medication Sig   albuterol (VENTOLIN HFA) 108 (90 Base) MCG/ACT inhaler Inhale 2 puffs into the lungs every 6 (six) hours as needed for wheezing or shortness of breath.   atorvastatin (LIPITOR) 40 MG tablet Take 1 tablet (40 mg total) by mouth at bedtime.   blood glucose meter kit and supplies KIT Use up to four times daily as directed.   Blood Glucose Monitoring Suppl (TRUE METRIX METER) w/Device KIT Use as directed   Blood Glucose Monitoring Suppl (TRUE METRIX METER) w/Device KIT use up to 4 times daily as directed   Dulaglutide (TRULICITY) 7.10 GY/6.9SW SOPN Inject 0.75 mg into the skin once a week. For 4 weeks then increase to 1.5 mg thereafter   Dulaglutide (TRULICITY) 1.5 NI/6.2VO SOPN Inject 1.5 mg into the skin once a week.   empagliflozin (JARDIANCE) 25 MG TABS tablet Take 1 tablet (25 mg total) by mouth daily before breakfast.   gabapentin (NEURONTIN) 300 MG capsule Take 1 capsule (300 mg total) by mouth 3 (three) times daily.   glipiZIDE (GLUCOTROL) 10 MG tablet Take 1 tablet (10 mg total) by mouth 2 (two) times daily.   glucose blood (TRUE METRIX BLOOD GLUCOSE TEST) test strip Use as instructed  glucose blood test strip use as directed   hydrocortisone cream 0.5 % Apply 1 Application topically 2 (two) times daily.   insulin aspart (NOVOLOG FLEXPEN) 100 UNIT/ML FlexPen Inject 14 Units into the skin 3 (three) times daily with meals.   Insulin Pen Needle (TRUEPLUS 5-BEVEL PEN NEEDLES) 32G X 4 MM MISC  USE AS DIRECTED   Lancets MISC use as directed   linaclotide (LINZESS) 145 MCG CAPS capsule Take 1 capsule (145 mcg total) by mouth daily before breakfast.   losartan (COZAAR) 25 MG tablet TAKE 1 TABLET (25 MG TOTAL) BY MOUTH DAILY.   metFORMIN (GLUCOPHAGE) 500 MG tablet Take 2 tablets (1,000 mg total) by mouth 2 (two) times daily with a meal.   metoprolol succinate (TOPROL XL) 25 MG 24 hr tablet Take 1 tablet (25 mg total) by mouth daily.   nitroGLYCERIN (NITROSTAT) 0.4 MG SL tablet Place 1 tablet (0.4 mg total) under the tongue every 5 (five) minutes as needed for chest pain. If you require more than two tablets five minutes apart go to the nearest ER via EMS.   omeprazole (PRILOSEC) 20 MG capsule Take 1 capsule (20 mg total) by mouth daily.   tiZANidine (ZANAFLEX) 4 MG tablet Take 1 tablet (4 mg total) by mouth every 8 (eight) hours as needed for muscle spasms.   [DISCONTINUED] Insulin Glargine (BASAGLAR KWIKPEN) 100 UNIT/ML Inject 60 Units into the skin daily.     PAST MEDICAL HISTORY: Past Medical History:  Diagnosis Date   Anxiety    Asthma    CVA (cerebral vascular accident) ([place])    Diabetes mellitus without complication ([place])    Diabetic neuropathy ([place]) 12/2013   vs carpal tunnell.  numbness tingling in right fingers. rx with Gabapentin.   Dyslipidemia 08/2009   Dyspnea    Gall stones 08/2009   GERD (gastroesophageal reflux disease)    Obesity    BMI 41, 250# 03/2015    PAST SURGICAL HISTORY: Past Surgical History:  Procedure Laterality Date   APPLICATION OF WOUND VAC N/A 05/09/2019   Procedure: APPLICATION OF WOUND VAC;  Surgeon: Olean Ree, MD;  Location: [facility];  Service: General;  Laterality: N/A;  [id number]   BUBBLE STUDY  12/24/2021   Procedure: BUBBLE STUDY;  Surgeon: Lelon Perla, MD;  Location: [place];  Service: Cardiovascular;;   CHOLECYSTECTOMY N/A 03/10/2015   Procedure: LAPAROSCOPIC CHOLECYSTECTOMY WITH INTRAOPERATIVE CHOLANGIOGRAM;  Surgeon:  Georganna Skeans, MD;  Location: [place];  Service: General;  Laterality: N/A;   ERCP N/A 03/11/2015   Procedure: ENDOSCOPIC RETROGRADE CHOLANGIOPANCREATOGRAPHY (ERCP);  Surgeon: Milus Banister, MD;  Location: [place];  Service: Endoscopy;  Laterality: N/A;   ESOPHAGOGASTRODUODENOSCOPY (EGD) WITH PROPOFOL N/A 07/25/2018   Procedure: ESOPHAGOGASTRODUODENOSCOPY (EGD) WITH PROPOFOL;  Surgeon: Rush Landmark Telford Nab., MD;  Location: [facility];  Service: Gastroenterology;  Laterality: N/A;   INSERTION OF MESH N/A 05/09/2019   Procedure: INSERTION OF MESH;  Surgeon: Olean Ree, MD;  Location: [facility];  Service: General;  Laterality: N/A;   TEE WITHOUT CARDIOVERSION N/A 12/24/2021   Procedure: TRANSESOPHAGEAL ECHOCARDIOGRAM (TEE);  Surgeon: Lelon Perla, MD;  Location: [place];  Service: Cardiovascular;  Laterality: N/A;   Transduodenal Ampullectomy  09/2015   TUBAL LIGATION     VENTRAL HERNIA REPAIR N/A 05/09/2019   Procedure: HERNIA REPAIR VENTRAL ADULT with MESH;  Surgeon: Olean Ree, MD;  Location: [facility];  Service: General;  Laterality: N/A;    FAMILY HISTORY: The patient family history includes Bone cancer in  her maternal grandfather; Diabetes in her father and mother; Heart attack in her father; Heart disease in her mother; Hypertension in her mother; Kidney disease in her maternal uncle; Other in her son. She was adopted.  SOCIAL HISTORY:  The patient  reports that she has quit smoking. Her smoking use included cigarettes. She has a 6.00 pack-year smoking history. She has never used smokeless tobacco. She reports current alcohol use. She reports that she does not use drugs.  REVIEW OF SYSTEMS: Review of Systems  Constitutional: Positive for malaise/fatigue and weight gain.  Cardiovascular:  Positive for chest pain (see HPI), dyspnea on exertion, leg swelling and palpitations. Negative for claudication, irregular heartbeat, near-syncope, orthopnea, paroxysmal nocturnal  dyspnea and syncope.  Respiratory:  Positive for shortness of breath.   Hematologic/Lymphatic: Negative for bleeding problem.  Musculoskeletal:  Negative for muscle cramps and myalgias.  Neurological:  Negative for dizziness and light-headedness.    PHYSICAL EXAM:    07/25/2022    2:12 PM 07/13/2022    3:04 PM 07/07/2022    2:28 PM  Vitals with BMI  Height '5\' 5"'$  '5\' 5"'$  '5\' 5"'$   Weight 264 lbs 262 lbs 262 lbs  BMI 43.93 42.3 53.6  Systolic 144 315 400  Diastolic 64 77 72  Pulse 81 72 97    Physical Exam  Constitutional: No distress.  Age appropriate, hemodynamically stable.   Neck: No JVD present.  Cardiovascular: Normal rate, regular rhythm, S1 normal, S2 normal, intact distal pulses and normal pulses. Exam reveals no gallop, no S3 and no S4.  No murmur heard. Pulmonary/Chest: Effort normal and breath sounds normal. No stridor. She has no wheezes. She has no rales.  Abdominal: Soft. Bowel sounds are normal. She exhibits no distension. There is no abdominal tenderness.  Obese  Musculoskeletal:        General: No edema.     Cervical back: Neck supple.  Neurological: She is alert and oriented to person, place, and time. She has intact cranial nerves (2-12).  Skin: Skin is warm and moist.   RADIOLOGY CT brain without contrast 12/20/2021: Chronic changes with no acute intracranial process identified.   MRI brain with and without contrast 12/21/2021: Brain MRI: 1. Patchy acute cortical infarcts in the left occipital lobe with extension along the posterior left watershed. 2. Remote perforator infarct the right corona radiata.   Cervical spine: 1. Normal motion degraded appearance of the cord. 2. C5-6 large right paracentral to foraminal protrusion encroaching on the right cord and causing right foraminal impingement.  CT angio head and neck with and without contrast code stroke protocol 12/21/2021: 1. No emergent vascular finding. 2. Atherosclerosis without flow limiting stenosis  of major vessels. 3. Left basal ganglia and left occipital cortex calcifications, are there risk factors for remote neurocysticercosis?  CT of the head without contrast 12/23/2021: 1. No acute intracranial hemorrhage. 2. Patchy left occipital lobe peau attenuation, compatible with known area of infarction.   CARDIAC DATABASE: EKG: 07/07/2022: Normal sinus rhythm, 87 bpm, without underlying ischemia or injury pattern.  Echocardiogram: 12/21/2021:  1. Left ventricular ejection fraction, by estimation, is 60 to 65%. The  left ventricle has normal function. The left ventricle has no regional  wall motion abnormalities. There is mild concentric left ventricular  hypertrophy. Left ventricular diastolic  parameters were normal.   2. Right ventricular systolic function is normal. The right ventricular  size is normal. Tricuspid regurgitation signal is inadequate for assessing  PA pressure.   3. A small pericardial  effusion is present. The pericardial effusion is  circumferential.   4. The mitral valve is normal in structure. No evidence of mitral valve  regurgitation. No evidence of mitral stenosis.   5. The aortic valve is tricuspid. There is mild thickening of the aortic  valve. Aortic valve regurgitation is not visualized. No aortic stenosis is  present.     Transesophageal echocardiogram 12/24/2021: Atrial septal aneurysm; no shunting noted with color doppler but positive saline microcavitation study suggests PFO. 2.Left ventricular ejection fraction, by estimation, is 55 to 60%. The left ventricle has normal function. The left ventricle has no regional wall motion abnormalities. 3. Right ventricular systolic function is normal. The right ventricular size is normal. 4. No left atrial/left atrial appendage thrombus was detected. 5. The mitral valve is normal in structure. Trivial mitral valve regurgitation. The aortic valve is tricuspid. Aortic valve regurgitation is trivial. Aortic valve  sclerosis is present, with no evidence of aortic valve stenosis. 6. Agitated saline contrast bubble study was positive with shunting observed within 3-6 cardiac cycles suggestive of interatrial shunt.  Stress Testing: Exercise nuclear stress test 07/19/2022: Myocardial perfusion is abnormal. Prominent breast attenuation in the inferior wall.  There is mild mid to apical anteroseptal thinning in stress images suggestive of a small reversible mild defect. TID ratio 1.43, which is abnormal. Stress LV EF is normal 52%. Overall LV systolic function is normal without regional wall motion abnormalities. Stress LV EF: 52%.  Normal ECG stress. The patient exercised for 4 minutes and 13 seconds of a Bruce protocol, achieving approximately 6.1 METs & 86% MPHR. NO chest pain, stress terminated due to THR achieved. Normal BP response.  No previous exam available for comparison. Intermediate risk due to abnormal TID ratio. Clinical correlation recommended in a patient with BMI >40. ~~Personally reviewed myocardial perfusion imaging which is more concerning for reversible ischemia in the basal to mid inferolateral and apical lateral segments suggestive of ischemia in the LCx distribution.  Heart Catheterization: None  Lower extremity venous duplex bilateral: 12/22/2021: - No evidence of deep vein thrombosis seen in the lower extremities, bilaterally.  -No evidence of popliteal cyst, bilaterally.   Transcranial Doppler with bubbles: 12/21/2021 Positive TCD Bubble study with valasalva only indicative of a small right to left shunt   LABORATORY DATA:    Latest Ref Rng & Units 07/21/2022   10:10 AM 12/22/2021    3:14 AM 12/20/2021    4:16 PM  CBC  WBC 3.4 - 10.8 x10E3/uL 6.2  6.9  8.2   Hemoglobin 11.1 - 15.9 g/dL 10.5  9.5  11.1   Hematocrit 34.0 - 46.6 % 37.3  30.4  36.8   Platelets 150 - 450 x10E3/uL 404  305  361        Latest Ref Rng & Units 07/21/2022   10:10 AM 07/13/2022    4:13 PM 12/22/2021     3:14 AM  CMP  Glucose 70 - 99 mg/dL 262  237  285   BUN 6 - 24 mg/dL '16  13  16   '$ Creatinine 0.57 - 1.00 mg/dL 0.89  0.81  0.92   Sodium 134 - 144 mmol/L 135  138  136   Potassium 3.5 - 5.2 mmol/L 4.3  4.4  4.0   Chloride 96 - 106 mmol/L 101  103  106   CO2 20 - 29 mmol/L '22  20  23   '$ Calcium 8.7 - 10.2 mg/dL 9.5  9.5  8.6   Total Protein 6.0 -  8.5 g/dL 7.6  7.8  5.9   Total Bilirubin 0.0 - 1.2 mg/dL 0.3  0.3  0.4   Alkaline Phos 44 - 121 IU/L 88  71  56   AST 0 - 40 IU/L 92  127  37   ALT 0 - 32 IU/L 105  95  33     Lipid Panel  Lab Results  Component Value Date   CHOL 126 07/21/2022   HDL 26 (L) 07/21/2022   LDLCALC 64 07/21/2022   LDLDIRECT 64 07/21/2022   TRIG 218 (H) 07/21/2022   CHOLHDL 6.0 12/21/2021   No components found for: "NTPROBNP" No results for input(s): "PROBNP" in the last 8760 hours. No results for input(s): "TSH" in the last 8760 hours.  BMP Recent Labs    12/20/21 1616 12/22/21 0314 07/13/22 1613 07/21/22 1010  NA 136 136 138 135  K 3.8 4.0 4.4 4.3  CL 103 106 103 101  CO2 20* '23 20 22  '$ GLUCOSE 216* 285* 237* 262*  BUN '16 16 13 16  '$ CREATININE 0.81 0.92 0.81 0.89  CALCIUM 8.8* 8.6* 9.5 9.5  GFRNONAA >60 >60  --   --     HEMOGLOBIN A1C Lab Results  Component Value Date   HGBA1C 10.4 (A) 07/13/2022   MPG 286.22 12/21/2021    IMPRESSION:    ICD-10-CM   1. Abnormal nuclear stress test  R94.39 metoprolol succinate (TOPROL XL) 25 MG 24 hr tablet    nitroGLYCERIN (NITROSTAT) 0.4 MG SL tablet    2. Precordial pain  R07.2     3. Dyspnea on exertion  R06.09     4. Type 2 diabetes mellitus with hyperglycemia, with long-term current use of insulin (HCC)  E11.65    Z79.4     5. Essential hypertension  I10     6. Dyslipidemia  E78.5     7. Cerebrovascular accident (CVA), unspecified mechanism ([place])  I63.9     8. PFO with atrial septal aneurysm  Q21.12    I25.3     9. Former smoker  Z87.891     65. Class 3 severe obesity due to  excess calories with serious comorbidity and body mass index (BMI) of 40.0 to 44.9 in adult [facility])  E66.01    Z68.41        RECOMMENDATIONS: Rolla Servidio is a 47 y.o. 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  Abnormal nuclear stress test Precordial pain. Dyspnea on exertion Precordial pain suggestive of both cardiac and noncardiac features and also has dyspnea on exertion which is multifactorial. Prior EKG nonischemic. Echocardiogram from June 2023 illustrates preserved LVEF. Nuclear perfusion stress test personally reviewed.  Low functional capacity for age, low normal LVEF, TID per ratio, basal to mid inferolateral and apical lateral segments suggestive of possible ischemia in the LCx/RCA distribution. Given her symptoms, poorly controlled insulin diabetes, hx of stroke, 38-year pack history of smoking, uncontrolled lipids shared decision was to proceed with invasive angiography to evaluate for obstructive CAD. Start Toprol-XL 25 mg p.o. daily. Sublingual nitroglycerin tablets to use on a as needed basis. Cancel coronary calcium score as she will proceed w/ angiogram.   The procedure of left heart catheterization with possible intervention was explained to the patient in detail.  The indication, alternatives, risks and benefits were reviewed.  Complications include but not limited to bleeding, infection, vascular injury, stroke, myocardial infarction, arrhythmia (requiring medical or cardiopulmonary resuscitation), kidney injury (requiring short-term or long-term hemodialysis), radiation-related injury in the case of prolonged fluoroscopy use, emergent cardiac surgery, temporary or permanent pacemaker, and death. The patient understands  the risks of serious complication is 1-2 in [id number] with diagnostic cardiac cath and 1-2% or less with angioplasty/stenting.  The patient  voices understanding and provides verbal feedback her questions and concerns are addressed to her satisfaction and patient wishes to proceed with coronary angiography with possible PCI.   Type 2 diabetes mellitus with hyperglycemia, with long-term current use of insulin (HCC) Uncontrolled. Most recent hemoglobin A1c greater than 10. Reemphasized importance of glycemic control. Currently on statin therapy, ARB, Farxiga, metformin. Fenofibrate was recently discontinued by PCP according to the patient due to LFTs. We emphasized the importance of reducing foods that are high in cholesterol and triglycerides.  Essential hypertension Office blood pressures within acceptable limits. Medications reconciled. Reemphasized the importance of glycemic control.  Dyslipidemia Currently on atorvastatin.   Fenofibrate held secondary to LFTs -will await clearance from PCP She denies myalgia or other side effects. Most recent lipids dated January 2024, independently reviewed as noted above. Currently managed by primary care provider.  Cerebrovascular accident (CVA), unspecified mechanism ([place]) PFO with atrial septal aneurysm Index event June 2023, per imaging left occipital and right coronary radiata region involved. Currently wearing a Zio patch. Stroke workup positive for PFO as per TEE and transcranial Doppler. Patient would like to have her PFO closed after ischemic workup is complete.  Former smoker 38-year pack history of smoking. Reemphasized the importance of complete smoking cessation. Quit in June 2023 after her stroke.  Class 3 severe obesity due to excess calories with serious comorbidity and body mass index (BMI) of 40.0 to 44.9 in adult [facility]) Body mass index is 43.93 kg/m. I reviewed with her importance of diet, regular physical activity/exercise,  weight loss.   Patient is educated on the importance of increasing physical activity gradually as tolerated with a goal of moderate intensity exercise for 30 minutes a day 5 days a week.  Until the workup is complete patient is encouraged not to overexert.  Educated on seeking medical attention sooner by going to the closest ER via EMS if the symptoms increase in intensity, frequency, duration, or has typical chest pain as discussed in the office.  Patient verbalized understanding.  Patient predominantly speaks Spanish and translation services provided by medical assistant during today's encounter.  Patient did not want me to discuss the plan of care with any other family members / next of kin.   FINAL MEDICATION LIST END OF ENCOUNTER: Meds ordered this encounter  Medications   metoprolol succinate (TOPROL XL) 25 MG 24 hr tablet    Sig: Take 1 tablet (25 mg total) by mouth daily.    Dispense:  90 tablet    Refill:  0   nitroGLYCERIN (NITROSTAT) 0.4 MG SL tablet    Sig: Place 1 tablet (0.4 mg total) under the tongue every 5 (five) minutes as needed for chest pain. If you require more than two tablets five minutes apart go to the nearest ER via EMS.    Dispense:  30 tablet    Refill:  0    There are no discontinued medications.    Current Outpatient Medications:    albuterol (VENTOLIN HFA) 108 (90 Base) MCG/ACT inhaler, Inhale 2 puffs into the lungs every 6 (six) hours as  needed for wheezing or shortness of breath., Disp: 18 g, Rfl: 1   atorvastatin (LIPITOR) 40 MG tablet, Take 1 tablet (40 mg total) by mouth at bedtime., Disp: 90 tablet, Rfl: 1   blood glucose meter kit and supplies KIT, Use up to four times daily as directed., Disp: 1 each, Rfl: 11   Blood Glucose Monitoring Suppl (TRUE METRIX METER) w/Device KIT, Use as directed, Disp: 1 kit, Rfl: 0   Blood Glucose Monitoring Suppl (TRUE METRIX METER) w/Device KIT, use up to 4 times daily as directed, Disp: 1 kit, Rfl: 0   Dulaglutide  (TRULICITY) 1.61 WR/6.0AV SOPN, Inject 0.75 mg into the skin once a week. For 4 weeks then increase to 1.5 mg thereafter, Disp: 2 mL, Rfl: 0   Dulaglutide (TRULICITY) 1.5 WU/9.8JX SOPN, Inject 1.5 mg into the skin once a week., Disp: 2 mL, Rfl: 3   empagliflozin (JARDIANCE) 25 MG TABS tablet, Take 1 tablet (25 mg total) by mouth daily before breakfast., Disp: 30 tablet, Rfl: 3   gabapentin (NEURONTIN) 300 MG capsule, Take 1 capsule (300 mg total) by mouth 3 (three) times daily., Disp: 270 capsule, Rfl: 1   glipiZIDE (GLUCOTROL) 10 MG tablet, Take 1 tablet (10 mg total) by mouth 2 (two) times daily., Disp: 180 tablet, Rfl: 1   glucose blood (TRUE METRIX BLOOD GLUCOSE TEST) test strip, Use as instructed, Disp: 100 each, Rfl: 12   glucose blood test strip, use as directed, Disp: 100 each, Rfl: 0   hydrocortisone cream 0.5 %, Apply 1 Application topically 2 (two) times daily., Disp: 30 g, Rfl: 0   insulin aspart (NOVOLOG FLEXPEN) 100 UNIT/ML FlexPen, Inject 14 Units into the skin 3 (three) times daily with meals., Disp: 9 mL, Rfl: 3   Insulin Pen Needle (TRUEPLUS 5-BEVEL PEN NEEDLES) 32G X 4 MM MISC, USE AS DIRECTED, Disp: 100 each, Rfl: 3   Lancets MISC, use as directed, Disp: 100 each, Rfl: 0   linaclotide (LINZESS) 145 MCG CAPS capsule, Take 1 capsule (145 mcg total) by mouth daily before breakfast., Disp: 90 capsule, Rfl: 1   losartan (COZAAR) 25 MG tablet, TAKE 1 TABLET (25 MG TOTAL) BY MOUTH DAILY., Disp: 90 tablet, Rfl: 1   metFORMIN (GLUCOPHAGE) 500 MG tablet, Take 2 tablets (1,000 mg total) by mouth 2 (two) times daily with a meal., Disp: 120 tablet, Rfl: 2   metoprolol succinate (TOPROL XL) 25 MG 24 hr tablet, Take 1 tablet (25 mg total) by mouth daily., Disp: 90 tablet, Rfl: 0   nitroGLYCERIN (NITROSTAT) 0.4 MG SL tablet, Place 1 tablet (0.4 mg total) under the tongue every 5 (five) minutes as needed for chest pain. If you require more than two tablets five minutes apart go to the nearest ER  via EMS., Disp: 30 tablet, Rfl: 0   omeprazole (PRILOSEC) 20 MG capsule, Take 1 capsule (20 mg total) by mouth daily., Disp: 90 capsule, Rfl: 1   tiZANidine (ZANAFLEX) 4 MG tablet, Take 1 tablet (4 mg total) by mouth every 8 (eight) hours as needed for muscle spasms., Disp: 60 tablet, Rfl: 1   fenofibrate 160 MG tablet, Take 1 tablet (160 mg total) by mouth daily. (Patient not taking: Reported on 07/25/2022), Disp: 90 tablet, Rfl: 1   insulin glargine (LANTUS SOLOSTAR) 100 UNIT/ML Solostar Pen, Inject 60 Units into the skin daily., Disp: 15 mL, Rfl: 3  No orders of the defined types were placed in this encounter.   There are no Patient Instructions on file  for this visit.   --Continue cardiac medications as reconciled in final medication list. --Return in about 4 weeks (around 08/22/2022) for Follow up after heart cath. or sooner if needed. --Continue follow-up with your primary care physician regarding the management of your other chronic comorbid conditions.  Patient's questions and concerns were addressed to her satisfaction. She voices understanding of the instructions provided during this encounter.   This note was created using a voice recognition software as a result there may be grammatical errors inadvertently enclosed that do not reflect the nature of this encounter. Every attempt is made to correct such [email or web address]  Rex Kras, [place], [facility]  Pager: [phone] Office: [phone]

## 2022-07-24 NOTE — Progress Notes (Signed)
ID:  Glena Pharris, DOB 05/02/1976, MRN [id number]  PCP:  Charlott Rakes, MD  Cardiologist:  Rex Kras, DO, [facility] (established care 07/07/2022)  Date: 07/25/22 Last Office Visit: 07/07/2022  Chief Complaint  Patient presents with    Abnormal nuclear stress test   Follow-up    HPI  Julie Robertson is a 47 y.o. 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    Patient predominantly prefers to communicate in Spanish and translation services provided by medical assistance during today's encounter.  Patient was noted to have a stroke in June 2023 and workup included transesophageal echocardiogram and transcranial Doppler both of which were positive for PFO.  However due to insurance reasons was not able to follow-up after her stroke.  At the last office visit the shared decision was to proceed with a Zio patch to evaluate for possible A-fib given that prior strokes involved both the right and left hemispheres.  She is currently wearing a monitor results of forthcoming.  Shared decision was also to proceed with PFO closure if the workup is essentially unremarkable.  However, during the initial presentation patient was complaining of symptoms of precordial pain, left-sided, 7 out of 10 in intensity, better with resting and relaxing, usually brought on by stressful situations.  Given her precordial pain and multiple risk factors including prior stroke and insulin-dependent diabetes that shared decision was to proceed with exercise nuclear stress test.  Patient was asked to come in sooner as a nuclear stress test was abnormal suggestive of reversible ischemia in either LCx/RCA distribution.  Since last  office visit patient states that she has had couple episodes of precordial pain similar to her episode in December 2023.  She feels more tired and fatigued compared to her baseline and also dyspnea on exertion.  Patient stated that she stopped smoking she has gained weight exponentially.  She currently has a Event organiser..   FUNCTIONAL STATUS: No structured exercise program or daily routine.   ALLERGIES: No Known Allergies  MEDICATION LIST PRIOR TO VISIT: Current Meds  Medication Sig   albuterol (VENTOLIN HFA) 108 (90 Base) MCG/ACT inhaler Inhale 2 puffs into the lungs every 6 (six) hours as needed for wheezing or shortness of breath.   atorvastatin (LIPITOR) 40 MG tablet Take 1 tablet (40 mg total) by mouth at bedtime.   blood glucose meter kit and supplies KIT Use up to four times daily as directed.   Blood Glucose Monitoring Suppl (TRUE METRIX METER) w/Device KIT Use as directed   Blood Glucose Monitoring Suppl (TRUE METRIX METER) w/Device KIT use up to 4 times daily as directed   Dulaglutide (TRULICITY) 2.42 AS/3.4HD SOPN Inject 0.75 mg into the skin once a week. For 4 weeks then increase to 1.5 mg thereafter   Dulaglutide (TRULICITY) 1.5 QQ/2.2LN SOPN Inject 1.5 mg into the skin once a week.   empagliflozin (JARDIANCE) 25 MG TABS tablet Take 1 tablet (25 mg total) by mouth daily before breakfast.   gabapentin (NEURONTIN) 300 MG capsule Take 1 capsule (300 mg total) by mouth 3 (three) times daily.   glipiZIDE (GLUCOTROL) 10 MG tablet Take 1 tablet (10 mg total) by mouth 2 (two) times daily.   glucose blood (TRUE METRIX BLOOD GLUCOSE TEST) test strip Use as instructed  glucose blood test strip use as directed   hydrocortisone cream 0.5 % Apply 1 Application topically 2 (two) times daily.   insulin aspart (NOVOLOG FLEXPEN) 100 UNIT/ML FlexPen Inject 14 Units into the skin 3 (three) times daily with meals.   Insulin Pen Needle (TRUEPLUS 5-BEVEL PEN NEEDLES) 32G X 4 MM MISC  USE AS DIRECTED   Lancets MISC use as directed   linaclotide (LINZESS) 145 MCG CAPS capsule Take 1 capsule (145 mcg total) by mouth daily before breakfast.   losartan (COZAAR) 25 MG tablet TAKE 1 TABLET (25 MG TOTAL) BY MOUTH DAILY.   metFORMIN (GLUCOPHAGE) 500 MG tablet Take 2 tablets (1,000 mg total) by mouth 2 (two) times daily with a meal.   metoprolol succinate (TOPROL XL) 25 MG 24 hr tablet Take 1 tablet (25 mg total) by mouth daily.   nitroGLYCERIN (NITROSTAT) 0.4 MG SL tablet Place 1 tablet (0.4 mg total) under the tongue every 5 (five) minutes as needed for chest pain. If you require more than two tablets five minutes apart go to the nearest ER via EMS.   omeprazole (PRILOSEC) 20 MG capsule Take 1 capsule (20 mg total) by mouth daily.   tiZANidine (ZANAFLEX) 4 MG tablet Take 1 tablet (4 mg total) by mouth every 8 (eight) hours as needed for muscle spasms.   [DISCONTINUED] Insulin Glargine (BASAGLAR KWIKPEN) 100 UNIT/ML Inject 60 Units into the skin daily.     PAST MEDICAL HISTORY: Past Medical History:  Diagnosis Date   Anxiety    Asthma    CVA (cerebral vascular accident) ([place])    Diabetes mellitus without complication ([place])    Diabetic neuropathy ([place]) 12/2013   vs carpal tunnell.  numbness tingling in right fingers. rx with Gabapentin.   Dyslipidemia 08/2009   Dyspnea    Gall stones 08/2009   GERD (gastroesophageal reflux disease)    Obesity    BMI 41, 250# 03/2015    PAST SURGICAL HISTORY: Past Surgical History:  Procedure Laterality Date   APPLICATION OF WOUND VAC N/A 05/09/2019   Procedure: APPLICATION OF WOUND VAC;  Surgeon: Olean Ree, MD;  Location: [facility];  Service: General;  Laterality: N/A;  [id number]   BUBBLE STUDY  12/24/2021   Procedure: BUBBLE STUDY;  Surgeon: Lelon Perla, MD;  Location: [place];  Service: Cardiovascular;;   CHOLECYSTECTOMY N/A 03/10/2015   Procedure: LAPAROSCOPIC CHOLECYSTECTOMY WITH INTRAOPERATIVE CHOLANGIOGRAM;  Surgeon:  Georganna Skeans, MD;  Location: [place];  Service: General;  Laterality: N/A;   ERCP N/A 03/11/2015   Procedure: ENDOSCOPIC RETROGRADE CHOLANGIOPANCREATOGRAPHY (ERCP);  Surgeon: Milus Banister, MD;  Location: [place];  Service: Endoscopy;  Laterality: N/A;   ESOPHAGOGASTRODUODENOSCOPY (EGD) WITH PROPOFOL N/A 07/25/2018   Procedure: ESOPHAGOGASTRODUODENOSCOPY (EGD) WITH PROPOFOL;  Surgeon: Rush Landmark Telford Nab., MD;  Location: [facility];  Service: Gastroenterology;  Laterality: N/A;   INSERTION OF MESH N/A 05/09/2019   Procedure: INSERTION OF MESH;  Surgeon: Olean Ree, MD;  Location: [facility];  Service: General;  Laterality: N/A;   TEE WITHOUT CARDIOVERSION N/A 12/24/2021   Procedure: TRANSESOPHAGEAL ECHOCARDIOGRAM (TEE);  Surgeon: Lelon Perla, MD;  Location: [place];  Service: Cardiovascular;  Laterality: N/A;   Transduodenal Ampullectomy  09/2015   TUBAL LIGATION     VENTRAL HERNIA REPAIR N/A 05/09/2019   Procedure: HERNIA REPAIR VENTRAL ADULT with MESH;  Surgeon: Olean Ree, MD;  Location: [facility];  Service: General;  Laterality: N/A;    FAMILY HISTORY: The patient family history includes Bone cancer in  her maternal grandfather; Diabetes in her father and mother; Heart attack in her father; Heart disease in her mother; Hypertension in her mother; Kidney disease in her maternal uncle; Other in her son. She was adopted.  SOCIAL HISTORY:  The patient  reports that she has quit smoking. Her smoking use included cigarettes. She has a 6.00 pack-year smoking history. She has never used smokeless tobacco. She reports current alcohol use. She reports that she does not use drugs.  REVIEW OF SYSTEMS: Review of Systems  Constitutional: Positive for malaise/fatigue and weight gain.  Cardiovascular:  Positive for chest pain (see HPI), dyspnea on exertion, leg swelling and palpitations. Negative for claudication, irregular heartbeat, near-syncope, orthopnea, paroxysmal nocturnal  dyspnea and syncope.  Respiratory:  Positive for shortness of breath.   Hematologic/Lymphatic: Negative for bleeding problem.  Musculoskeletal:  Negative for muscle cramps and myalgias.  Neurological:  Negative for dizziness and light-headedness.    PHYSICAL EXAM:    07/25/2022    2:12 PM 07/13/2022    3:04 PM 07/07/2022    2:28 PM  Vitals with BMI  Height '5\' 5"'$  '5\' 5"'$  '5\' 5"'$   Weight 264 lbs 262 lbs 262 lbs  BMI 43.93 26.2 03.5  Systolic 597 416 384  Diastolic 64 77 72  Pulse 81 72 97    Physical Exam  Constitutional: No distress.  Age appropriate, hemodynamically stable.   Neck: No JVD present.  Cardiovascular: Normal rate, regular rhythm, S1 normal, S2 normal, intact distal pulses and normal pulses. Exam reveals no gallop, no S3 and no S4.  No murmur heard. Pulmonary/Chest: Effort normal and breath sounds normal. No stridor. She has no wheezes. She has no rales.  Abdominal: Soft. Bowel sounds are normal. She exhibits no distension. There is no abdominal tenderness.  Obese  Musculoskeletal:        General: No edema.     Cervical back: Neck supple.  Neurological: She is alert and oriented to person, place, and time. She has intact cranial nerves (2-12).  Skin: Skin is warm and moist.   RADIOLOGY CT brain without contrast 12/20/2021: Chronic changes with no acute intracranial process identified.   MRI brain with and without contrast 12/21/2021: Brain MRI: 1. Patchy acute cortical infarcts in the left occipital lobe with extension along the posterior left watershed. 2. Remote perforator infarct the right corona radiata.   Cervical spine: 1. Normal motion degraded appearance of the cord. 2. C5-6 large right paracentral to foraminal protrusion encroaching on the right cord and causing right foraminal impingement.  CT angio head and neck with and without contrast code stroke protocol 12/21/2021: 1. No emergent vascular finding. 2. Atherosclerosis without flow limiting stenosis  of major vessels. 3. Left basal ganglia and left occipital cortex calcifications, are there risk factors for remote neurocysticercosis?  CT of the head without contrast 12/23/2021: 1. No acute intracranial hemorrhage. 2. Patchy left occipital lobe peau attenuation, compatible with known area of infarction.   CARDIAC DATABASE: EKG: 07/07/2022: Normal sinus rhythm, 87 bpm, without underlying ischemia or injury pattern.  Echocardiogram: 12/21/2021:  1. Left ventricular ejection fraction, by estimation, is 60 to 65%. The  left ventricle has normal function. The left ventricle has no regional  wall motion abnormalities. There is mild concentric left ventricular  hypertrophy. Left ventricular diastolic  parameters were normal.   2. Right ventricular systolic function is normal. The right ventricular  size is normal. Tricuspid regurgitation signal is inadequate for assessing  PA pressure.   3. A small pericardial  effusion is present. The pericardial effusion is  circumferential.   4. The mitral valve is normal in structure. No evidence of mitral valve  regurgitation. No evidence of mitral stenosis.   5. The aortic valve is tricuspid. There is mild thickening of the aortic  valve. Aortic valve regurgitation is not visualized. No aortic stenosis is  present.     Transesophageal echocardiogram 12/24/2021: Atrial septal aneurysm; no shunting noted with color doppler but positive saline microcavitation study suggests PFO. 2.Left ventricular ejection fraction, by estimation, is 55 to 60%. The left ventricle has normal function. The left ventricle has no regional wall motion abnormalities. 3. Right ventricular systolic function is normal. The right ventricular size is normal. 4. No left atrial/left atrial appendage thrombus was detected. 5. The mitral valve is normal in structure. Trivial mitral valve regurgitation. The aortic valve is tricuspid. Aortic valve regurgitation is trivial. Aortic valve  sclerosis is present, with no evidence of aortic valve stenosis. 6. Agitated saline contrast bubble study was positive with shunting observed within 3-6 cardiac cycles suggestive of interatrial shunt.  Stress Testing: Exercise nuclear stress test 07/19/2022: Myocardial perfusion is abnormal. Prominent breast attenuation in the inferior wall.  There is mild mid to apical anteroseptal thinning in stress images suggestive of a small reversible mild defect. TID ratio 1.43, which is abnormal. Stress LV EF is normal 52%. Overall LV systolic function is normal without regional wall motion abnormalities. Stress LV EF: 52%.  Normal ECG stress. The patient exercised for 4 minutes and 13 seconds of a Bruce protocol, achieving approximately 6.1 METs & 86% MPHR. NO chest pain, stress terminated due to THR achieved. Normal BP response.  No previous exam available for comparison. Intermediate risk due to abnormal TID ratio. Clinical correlation recommended in a patient with BMI >40. ~~Personally reviewed myocardial perfusion imaging which is more concerning for reversible ischemia in the basal to mid inferolateral and apical lateral segments suggestive of ischemia in the LCx distribution.  Heart Catheterization: None  Lower extremity venous duplex bilateral: 12/22/2021: - No evidence of deep vein thrombosis seen in the lower extremities, bilaterally.  -No evidence of popliteal cyst, bilaterally.   Transcranial Doppler with bubbles: 12/21/2021 Positive TCD Bubble study with valasalva only indicative of a small right to left shunt   LABORATORY DATA:    Latest Ref Rng & Units 07/21/2022   10:10 AM 12/22/2021    3:14 AM 12/20/2021    4:16 PM  CBC  WBC 3.4 - 10.8 x10E3/uL 6.2  6.9  8.2   Hemoglobin 11.1 - 15.9 g/dL 10.5  9.5  11.1   Hematocrit 34.0 - 46.6 % 37.3  30.4  36.8   Platelets 150 - 450 x10E3/uL 404  305  361        Latest Ref Rng & Units 07/21/2022   10:10 AM 07/13/2022    4:13 PM 12/22/2021     3:14 AM  CMP  Glucose 70 - 99 mg/dL 262  237  285   BUN 6 - 24 mg/dL '16  13  16   '$ Creatinine 0.57 - 1.00 mg/dL 0.89  0.81  0.92   Sodium 134 - 144 mmol/L 135  138  136   Potassium 3.5 - 5.2 mmol/L 4.3  4.4  4.0   Chloride 96 - 106 mmol/L 101  103  106   CO2 20 - 29 mmol/L '22  20  23   '$ Calcium 8.7 - 10.2 mg/dL 9.5  9.5  8.6   Total Protein 6.0 -  8.5 g/dL 7.6  7.8  5.9   Total Bilirubin 0.0 - 1.2 mg/dL 0.3  0.3  0.4   Alkaline Phos 44 - 121 IU/L 88  71  56   AST 0 - 40 IU/L 92  127  37   ALT 0 - 32 IU/L 105  95  33     Lipid Panel  Lab Results  Component Value Date   CHOL 126 07/21/2022   HDL 26 (L) 07/21/2022   LDLCALC 64 07/21/2022   LDLDIRECT 64 07/21/2022   TRIG 218 (H) 07/21/2022   CHOLHDL 6.0 12/21/2021   No components found for: "NTPROBNP" No results for input(s): "PROBNP" in the last 8760 hours. No results for input(s): "TSH" in the last 8760 hours.  BMP Recent Labs    12/20/21 1616 12/22/21 0314 07/13/22 1613 07/21/22 1010  NA 136 136 138 135  K 3.8 4.0 4.4 4.3  CL 103 106 103 101  CO2 20* '23 20 22  '$ GLUCOSE 216* 285* 237* 262*  BUN '16 16 13 16  '$ CREATININE 0.81 0.92 0.81 0.89  CALCIUM 8.8* 8.6* 9.5 9.5  GFRNONAA >60 >60  --   --     HEMOGLOBIN A1C Lab Results  Component Value Date   HGBA1C 10.4 (A) 07/13/2022   MPG 286.22 12/21/2021    IMPRESSION:    ICD-10-CM   1. Abnormal nuclear stress test  R94.39 metoprolol succinate (TOPROL XL) 25 MG 24 hr tablet    nitroGLYCERIN (NITROSTAT) 0.4 MG SL tablet    2. Precordial pain  R07.2     3. Dyspnea on exertion  R06.09     4. Type 2 diabetes mellitus with hyperglycemia, with long-term current use of insulin (HCC)  E11.65    Z79.4     5. Essential hypertension  I10     6. Dyslipidemia  E78.5     7. Cerebrovascular accident (CVA), unspecified mechanism ([place])  I63.9     8. PFO with atrial septal aneurysm  Q21.12    I25.3     9. Former smoker  Z87.891     19. Class 3 severe obesity due to  excess calories with serious comorbidity and body mass index (BMI) of 40.0 to 44.9 in adult [facility])  E66.01    Z68.41        RECOMMENDATIONS: Briselda Naval is a 47 y.o. 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  Abnormal nuclear stress test Precordial pain. Dyspnea on exertion Precordial pain suggestive of both cardiac and noncardiac features and also has dyspnea on exertion which is multifactorial. Prior EKG nonischemic. Echocardiogram from June 2023 illustrates preserved LVEF. Nuclear perfusion stress test personally reviewed.  Low functional capacity for age, low normal LVEF, TID per ratio, basal to mid inferolateral and apical lateral segments suggestive of possible ischemia in the LCx/RCA distribution. Given her symptoms, poorly controlled insulin diabetes, hx of stroke, 38-year pack history of smoking, uncontrolled lipids shared decision was to proceed with invasive angiography to evaluate for obstructive CAD. Start Toprol-XL 25 mg p.o. daily. Sublingual nitroglycerin tablets to use on a as needed basis. Cancel coronary calcium score as she will proceed w/ angiogram.   The procedure of left heart catheterization with possible intervention was explained to the patient in detail.  The indication, alternatives, risks and benefits were reviewed.  Complications include but not limited to bleeding, infection, vascular injury, stroke, myocardial infarction, arrhythmia (requiring medical or cardiopulmonary resuscitation), kidney injury (requiring short-term or long-term hemodialysis), radiation-related injury in the case of prolonged fluoroscopy use, emergent cardiac surgery, temporary or permanent pacemaker, and death. The patient understands  the risks of serious complication is 1-2 in 1610 with diagnostic cardiac cath and 1-2% or less with angioplasty/stenting.  The patient  voices understanding and provides verbal feedback her questions and concerns are addressed to her satisfaction and patient wishes to proceed with coronary angiography with possible PCI.   Type 2 diabetes mellitus with hyperglycemia, with long-term current use of insulin (HCC) Uncontrolled. Most recent hemoglobin A1c greater than 10. Reemphasized importance of glycemic control. Currently on statin therapy, ARB, Farxiga, metformin. Fenofibrate was recently discontinued by PCP according to the patient due to LFTs. We emphasized the importance of reducing foods that are high in cholesterol and triglycerides.  Essential hypertension Office blood pressures within acceptable limits. Medications reconciled. Reemphasized the importance of glycemic control.  Dyslipidemia Currently on atorvastatin.   Fenofibrate held secondary to LFTs -will await clearance from PCP She denies myalgia or other side effects. Most recent lipids dated January 2024, independently reviewed as noted above. Currently managed by primary care provider.  Cerebrovascular accident (CVA), unspecified mechanism ([place]) PFO with atrial septal aneurysm Index event June 2023, per imaging left occipital and right coronary radiata region involved. Currently wearing a Zio patch. Stroke workup positive for PFO as per TEE and transcranial Doppler. Patient would like to have her PFO closed after ischemic workup is complete.  Former smoker 38-year pack history of smoking. Reemphasized the importance of complete smoking cessation. Quit in June 2023 after her stroke.  Class 3 severe obesity due to excess calories with serious comorbidity and body mass index (BMI) of 40.0 to 44.9 in adult [facility]) Body mass index is 43.93 kg/m. I reviewed with her importance of diet, regular physical activity/exercise,  weight loss.   Patient is educated on the importance of increasing physical activity gradually as tolerated with a goal of moderate intensity exercise for 30 minutes a day 5 days a week.  Until the workup is complete patient is encouraged not to overexert.  Educated on seeking medical attention sooner by going to the closest ER via EMS if the symptoms increase in intensity, frequency, duration, or has typical chest pain as discussed in the office.  Patient verbalized understanding.  Patient predominantly speaks Spanish and translation services provided by medical assistant during today's encounter.  Patient did not want me to discuss the plan of care with any other family members / next of kin.   FINAL MEDICATION LIST END OF ENCOUNTER: Meds ordered this encounter  Medications   metoprolol succinate (TOPROL XL) 25 MG 24 hr tablet    Sig: Take 1 tablet (25 mg total) by mouth daily.    Dispense:  90 tablet    Refill:  0   nitroGLYCERIN (NITROSTAT) 0.4 MG SL tablet    Sig: Place 1 tablet (0.4 mg total) under the tongue every 5 (five) minutes as needed for chest pain. If you require more than two tablets five minutes apart go to the nearest ER via EMS.    Dispense:  30 tablet    Refill:  0    There are no discontinued medications.    Current Outpatient Medications:    albuterol (VENTOLIN HFA) 108 (90 Base) MCG/ACT inhaler, Inhale 2 puffs into the lungs every 6 (six) hours as  needed for wheezing or shortness of breath., Disp: 18 g, Rfl: 1   atorvastatin (LIPITOR) 40 MG tablet, Take 1 tablet (40 mg total) by mouth at bedtime., Disp: 90 tablet, Rfl: 1   blood glucose meter kit and supplies KIT, Use up to four times daily as directed., Disp: 1 each, Rfl: 11   Blood Glucose Monitoring Suppl (TRUE METRIX METER) w/Device KIT, Use as directed, Disp: 1 kit, Rfl: 0   Blood Glucose Monitoring Suppl (TRUE METRIX METER) w/Device KIT, use up to 4 times daily as directed, Disp: 1 kit, Rfl: 0   Dulaglutide  (TRULICITY) 6.81 EX/5.1ZG SOPN, Inject 0.75 mg into the skin once a week. For 4 weeks then increase to 1.5 mg thereafter, Disp: 2 mL, Rfl: 0   Dulaglutide (TRULICITY) 1.5 YF/7.4BS SOPN, Inject 1.5 mg into the skin once a week., Disp: 2 mL, Rfl: 3   empagliflozin (JARDIANCE) 25 MG TABS tablet, Take 1 tablet (25 mg total) by mouth daily before breakfast., Disp: 30 tablet, Rfl: 3   gabapentin (NEURONTIN) 300 MG capsule, Take 1 capsule (300 mg total) by mouth 3 (three) times daily., Disp: 270 capsule, Rfl: 1   glipiZIDE (GLUCOTROL) 10 MG tablet, Take 1 tablet (10 mg total) by mouth 2 (two) times daily., Disp: 180 tablet, Rfl: 1   glucose blood (TRUE METRIX BLOOD GLUCOSE TEST) test strip, Use as instructed, Disp: 100 each, Rfl: 12   glucose blood test strip, use as directed, Disp: 100 each, Rfl: 0   hydrocortisone cream 0.5 %, Apply 1 Application topically 2 (two) times daily., Disp: 30 g, Rfl: 0   insulin aspart (NOVOLOG FLEXPEN) 100 UNIT/ML FlexPen, Inject 14 Units into the skin 3 (three) times daily with meals., Disp: 9 mL, Rfl: 3   Insulin Pen Needle (TRUEPLUS 5-BEVEL PEN NEEDLES) 32G X 4 MM MISC, USE AS DIRECTED, Disp: 100 each, Rfl: 3   Lancets MISC, use as directed, Disp: 100 each, Rfl: 0   linaclotide (LINZESS) 145 MCG CAPS capsule, Take 1 capsule (145 mcg total) by mouth daily before breakfast., Disp: 90 capsule, Rfl: 1   losartan (COZAAR) 25 MG tablet, TAKE 1 TABLET (25 MG TOTAL) BY MOUTH DAILY., Disp: 90 tablet, Rfl: 1   metFORMIN (GLUCOPHAGE) 500 MG tablet, Take 2 tablets (1,000 mg total) by mouth 2 (two) times daily with a meal., Disp: 120 tablet, Rfl: 2   metoprolol succinate (TOPROL XL) 25 MG 24 hr tablet, Take 1 tablet (25 mg total) by mouth daily., Disp: 90 tablet, Rfl: 0   nitroGLYCERIN (NITROSTAT) 0.4 MG SL tablet, Place 1 tablet (0.4 mg total) under the tongue every 5 (five) minutes as needed for chest pain. If you require more than two tablets five minutes apart go to the nearest ER  via EMS., Disp: 30 tablet, Rfl: 0   omeprazole (PRILOSEC) 20 MG capsule, Take 1 capsule (20 mg total) by mouth daily., Disp: 90 capsule, Rfl: 1   tiZANidine (ZANAFLEX) 4 MG tablet, Take 1 tablet (4 mg total) by mouth every 8 (eight) hours as needed for muscle spasms., Disp: 60 tablet, Rfl: 1   fenofibrate 160 MG tablet, Take 1 tablet (160 mg total) by mouth daily. (Patient not taking: Reported on 07/25/2022), Disp: 90 tablet, Rfl: 1   insulin glargine (LANTUS SOLOSTAR) 100 UNIT/ML Solostar Pen, Inject 60 Units into the skin daily., Disp: 15 mL, Rfl: 3  No orders of the defined types were placed in this encounter.   There are no Patient Instructions on file  for this visit.   --Continue cardiac medications as reconciled in final medication list. --Return in about 4 weeks (around 08/22/2022) for Follow up after heart cath. or sooner if needed. --Continue follow-up with your primary care physician regarding the management of your other chronic comorbid conditions.  Patient's questions and concerns were addressed to her satisfaction. She voices understanding of the instructions provided during this encounter.   This note was created using a voice recognition software as a result there may be grammatical errors inadvertently enclosed that do not reflect the nature of this encounter. Every attempt is made to correct such errors.  Rex Kras, [place], [facility]  Pager: [phone] Office: [phone]

## 2022-07-25 ENCOUNTER — Other Ambulatory Visit: Payer: Self-pay | Admitting: Pharmacist

## 2022-07-25 ENCOUNTER — Other Ambulatory Visit: Payer: Self-pay

## 2022-07-25 ENCOUNTER — Ambulatory Visit: Payer: Medicaid Other | Admitting: Cardiology

## 2022-07-25 ENCOUNTER — Encounter: Payer: Self-pay | Admitting: Cardiology

## 2022-07-25 VITALS — BP 133/64 | HR 81 | Ht 65.0 in | Wt 264.0 lb

## 2022-07-25 DIAGNOSIS — R072 Precordial pain: Secondary | ICD-10-CM

## 2022-07-25 DIAGNOSIS — E785 Hyperlipidemia, unspecified: Secondary | ICD-10-CM

## 2022-07-25 DIAGNOSIS — I253 Aneurysm of heart: Secondary | ICD-10-CM

## 2022-07-25 DIAGNOSIS — F17210 Nicotine dependence, cigarettes, uncomplicated: Secondary | ICD-10-CM

## 2022-07-25 DIAGNOSIS — Z87891 Personal history of nicotine dependence: Secondary | ICD-10-CM

## 2022-07-25 DIAGNOSIS — I10 Essential (primary) hypertension: Secondary | ICD-10-CM

## 2022-07-25 DIAGNOSIS — I639 Cerebral infarction, unspecified: Secondary | ICD-10-CM

## 2022-07-25 DIAGNOSIS — R0609 Other forms of dyspnea: Secondary | ICD-10-CM

## 2022-07-25 DIAGNOSIS — R9439 Abnormal result of other cardiovascular function study: Secondary | ICD-10-CM

## 2022-07-25 DIAGNOSIS — E1165 Type 2 diabetes mellitus with hyperglycemia: Secondary | ICD-10-CM

## 2022-07-25 MED ORDER — LANTUS SOLOSTAR 100 UNIT/ML ~~LOC~~ SOPN
60.0000 [IU] | PEN_INJECTOR | Freq: Every day | SUBCUTANEOUS | 3 refills | Status: DC
  Filled 2022-07-25: qty 15, 25d supply, fill #0
  Filled 2022-08-30: qty 15, 25d supply, fill #1

## 2022-07-25 MED ORDER — NITROGLYCERIN 0.4 MG SL SUBL
0.4000 mg | SUBLINGUAL_TABLET | SUBLINGUAL | 0 refills | Status: DC | PRN
  Filled 2022-07-25: qty 25, 9d supply, fill #0

## 2022-07-25 MED ORDER — METOPROLOL SUCCINATE ER 25 MG PO TB24
25.0000 mg | ORAL_TABLET | Freq: Every day | ORAL | 0 refills | Status: DC
  Filled 2022-07-25: qty 30, 30d supply, fill #0

## 2022-07-26 ENCOUNTER — Other Ambulatory Visit: Payer: Self-pay

## 2022-08-04 ENCOUNTER — Other Ambulatory Visit: Payer: Medicaid Other

## 2022-08-05 ENCOUNTER — Other Ambulatory Visit: Payer: Self-pay | Admitting: Family Medicine

## 2022-08-05 ENCOUNTER — Other Ambulatory Visit: Payer: Self-pay

## 2022-08-05 DIAGNOSIS — Z794 Long term (current) use of insulin: Secondary | ICD-10-CM

## 2022-08-08 ENCOUNTER — Other Ambulatory Visit: Payer: Self-pay

## 2022-08-09 ENCOUNTER — Other Ambulatory Visit: Payer: Self-pay

## 2022-08-09 ENCOUNTER — Observation Stay (HOSPITAL_COMMUNITY)
Admission: RE | Admit: 2022-08-09 | Discharge: 2022-08-10 | Disposition: A | Discharge status: Home / Self Care | Payer: Medicaid Other | Attending: Cardiology | Admitting: Cardiology

## 2022-08-09 ENCOUNTER — Encounter (HOSPITAL_COMMUNITY)
Admission: RE | Disposition: A | Discharge status: Home / Self Care | Payer: Self-pay | Source: Home / Self Care | Attending: Cardiology

## 2022-08-09 ENCOUNTER — Encounter (HOSPITAL_COMMUNITY): Payer: Self-pay | Admitting: Cardiology

## 2022-08-09 DIAGNOSIS — E1165 Type 2 diabetes mellitus with hyperglycemia: Secondary | ICD-10-CM | POA: Insufficient documentation

## 2022-08-09 DIAGNOSIS — Q2112 Patent foramen ovale: Secondary | ICD-10-CM

## 2022-08-09 DIAGNOSIS — Z87891 Personal history of nicotine dependence: Secondary | ICD-10-CM | POA: Diagnosis not present

## 2022-08-09 DIAGNOSIS — E1140 Type 2 diabetes mellitus with diabetic neuropathy, unspecified: Secondary | ICD-10-CM | POA: Diagnosis not present

## 2022-08-09 DIAGNOSIS — E7800 Pure hypercholesterolemia, unspecified: Secondary | ICD-10-CM | POA: Insufficient documentation

## 2022-08-09 DIAGNOSIS — Z9861 Coronary angioplasty status: Secondary | ICD-10-CM

## 2022-08-09 DIAGNOSIS — I25118 Atherosclerotic heart disease of native coronary artery with other forms of angina pectoris: Principal | ICD-10-CM | POA: Insufficient documentation

## 2022-08-09 DIAGNOSIS — Z7984 Long term (current) use of oral hypoglycemic drugs: Secondary | ICD-10-CM | POA: Insufficient documentation

## 2022-08-09 DIAGNOSIS — Z8673 Personal history of transient ischemic attack (TIA), and cerebral infarction without residual deficits: Secondary | ICD-10-CM | POA: Diagnosis not present

## 2022-08-09 DIAGNOSIS — Z794 Long term (current) use of insulin: Secondary | ICD-10-CM | POA: Insufficient documentation

## 2022-08-09 DIAGNOSIS — Z7985 Long-term (current) use of injectable non-insulin antidiabetic drugs: Secondary | ICD-10-CM | POA: Diagnosis not present

## 2022-08-09 DIAGNOSIS — E781 Pure hyperglyceridemia: Secondary | ICD-10-CM

## 2022-08-09 DIAGNOSIS — E1169 Type 2 diabetes mellitus with other specified complication: Secondary | ICD-10-CM

## 2022-08-09 DIAGNOSIS — E782 Mixed hyperlipidemia: Secondary | ICD-10-CM

## 2022-08-09 HISTORY — DX: Tubal ligation status: Z98.51

## 2022-08-09 HISTORY — PX: LEFT HEART CATH AND CORONARY ANGIOGRAPHY: CATH118249

## 2022-08-09 HISTORY — PX: CORONARY BALLOON ANGIOPLASTY: CATH118233

## 2022-08-09 LAB — GLUCOSE, CAPILLARY
Glucose-Capillary: 289 mg/dL — ABNORMAL HIGH (ref 70–99)
Glucose-Capillary: 241 mg/dL — ABNORMAL HIGH (ref 70–99)
Glucose-Capillary: 338 mg/dL — ABNORMAL HIGH (ref 70–99)
Glucose-Capillary: 366 mg/dL — ABNORMAL HIGH (ref 70–99)

## 2022-08-09 LAB — POCT ACTIVATED CLOTTING TIME: Activated Clotting Time: 228 seconds

## 2022-08-09 SURGERY — LEFT HEART CATH AND CORONARY ANGIOGRAPHY
Anesthesia: LOCAL

## 2022-08-09 MED ORDER — VERAPAMIL HCL 2.5 MG/ML IV SOLN
INTRAVENOUS | Status: DC | PRN
  Administered 2022-08-09: 10 mL via INTRA_ARTERIAL

## 2022-08-09 MED ORDER — ONDANSETRON HCL 4 MG/2ML IJ SOLN
INTRAMUSCULAR | Status: AC
  Filled 2022-08-09: qty 2

## 2022-08-09 MED ORDER — INSULIN ASPART 100 UNIT/ML IJ SOLN
14.0000 [IU] | Freq: Three times a day (TID) | INTRAMUSCULAR | Status: DC
  Administered 2022-08-09 – 2022-08-10 (×3): 14 [IU] via SUBCUTANEOUS

## 2022-08-09 MED ORDER — TIZANIDINE HCL 4 MG PO TABS
4.0000 mg | ORAL_TABLET | Freq: Three times a day (TID) | ORAL | Status: DC | PRN
  Administered 2022-08-09: 4 mg via ORAL
  Filled 2022-08-09 (×2): qty 1

## 2022-08-09 MED ORDER — SODIUM CHLORIDE 0.9% FLUSH
3.0000 mL | Freq: Two times a day (BID) | INTRAVENOUS | Status: DC
  Administered 2022-08-09 – 2022-08-10 (×2): 3 mL via INTRAVENOUS

## 2022-08-09 MED ORDER — IOHEXOL 350 MG/ML SOLN
INTRAVENOUS | Status: DC | PRN
  Administered 2022-08-09: 85 mL via INTRA_ARTERIAL

## 2022-08-09 MED ORDER — HEPARIN SODIUM (PORCINE) 1000 UNIT/ML IJ SOLN
INTRAMUSCULAR | Status: AC
  Filled 2022-08-09: qty 10

## 2022-08-09 MED ORDER — LIDOCAINE HCL (PF) 1 % IJ SOLN
INTRAMUSCULAR | Status: DC | PRN
  Administered 2022-08-09: 2 mL

## 2022-08-09 MED ORDER — INSULIN ASPART 100 UNIT/ML IJ SOLN
INTRAMUSCULAR | Status: AC
  Filled 2022-08-09: qty 1

## 2022-08-09 MED ORDER — CLOPIDOGREL BISULFATE 75 MG PO TABS
75.0000 mg | ORAL_TABLET | Freq: Every day | ORAL | Status: DC
  Administered 2022-08-10: 75 mg via ORAL
  Filled 2022-08-09: qty 1

## 2022-08-09 MED ORDER — FAMOTIDINE IN NACL 20-0.9 MG/50ML-% IV SOLN
INTRAVENOUS | Status: DC | PRN
  Administered 2022-08-09: 20 mg via INTRAVENOUS

## 2022-08-09 MED ORDER — HEPARIN (PORCINE) IN NACL 1000-0.9 UT/500ML-% IV SOLN
INTRAVENOUS | Status: AC
  Filled 2022-08-09: qty 500

## 2022-08-09 MED ORDER — SODIUM CHLORIDE 0.9 % WEIGHT BASED INFUSION
3.0000 mL/kg/h | INTRAVENOUS | Status: DC
  Administered 2022-08-09: 3 mL/kg/h via INTRAVENOUS

## 2022-08-09 MED ORDER — VERAPAMIL HCL 2.5 MG/ML IV SOLN
INTRAVENOUS | Status: AC
  Filled 2022-08-09: qty 2

## 2022-08-09 MED ORDER — MIDAZOLAM HCL 2 MG/2ML IJ SOLN
INTRAMUSCULAR | Status: DC | PRN
  Administered 2022-08-09: 1 mg via INTRAVENOUS
  Administered 2022-08-09: 2 mg via INTRAVENOUS

## 2022-08-09 MED ORDER — HEPARIN SODIUM (PORCINE) 1000 UNIT/ML IJ SOLN
INTRAMUSCULAR | Status: DC | PRN
  Administered 2022-08-09: 5000 [IU] via INTRAVENOUS
  Administered 2022-08-09: 4000 [IU] via INTRAVENOUS
  Administered 2022-08-09: 3000 [IU] via INTRAVENOUS

## 2022-08-09 MED ORDER — SODIUM CHLORIDE 0.9 % WEIGHT BASED INFUSION: 1.0000 mL/kg/h | INTRAVENOUS | Status: AC

## 2022-08-09 MED ORDER — ASPIRIN 81 MG PO CHEW
81.0000 mg | CHEWABLE_TABLET | ORAL | Status: AC
  Administered 2022-08-09: 81 mg via ORAL
  Filled 2022-08-09: qty 1

## 2022-08-09 MED ORDER — SODIUM CHLORIDE 0.9 % IV SOLN: 250.0000 mL | INTRAVENOUS | Status: DC | PRN

## 2022-08-09 MED ORDER — FENOFIBRATE 160 MG PO TABS
160.0000 mg | ORAL_TABLET | Freq: Every day | ORAL | Status: DC
  Administered 2022-08-10: 160 mg via ORAL
  Filled 2022-08-09: qty 1

## 2022-08-09 MED ORDER — MIDAZOLAM HCL 2 MG/2ML IJ SOLN
INTRAMUSCULAR | Status: AC
  Filled 2022-08-09: qty 2

## 2022-08-09 MED ORDER — EMPAGLIFLOZIN 25 MG PO TABS: 25.0000 mg | ORAL_TABLET | Freq: Every day | ORAL | Status: DC

## 2022-08-09 MED ORDER — ONDANSETRON HCL 4 MG/2ML IJ SOLN
INTRAMUSCULAR | Status: DC | PRN
  Administered 2022-08-09: 4 mg via INTRAVENOUS

## 2022-08-09 MED ORDER — CLOPIDOGREL BISULFATE 300 MG PO TABS
ORAL_TABLET | ORAL | Status: DC | PRN
  Administered 2022-08-09: 600 mg via ORAL

## 2022-08-09 MED ORDER — ALUM & MAG HYDROXIDE-SIMETH 200-200-20 MG/5ML PO SUSP
30.0000 mL | Freq: Once | ORAL | Status: AC
  Administered 2022-08-09: 30 mL via ORAL
  Filled 2022-08-09: qty 30

## 2022-08-09 MED ORDER — METOPROLOL SUCCINATE ER 25 MG PO TB24
25.0000 mg | ORAL_TABLET | Freq: Every day | ORAL | Status: DC
  Administered 2022-08-09 – 2022-08-10 (×2): 25 mg via ORAL
  Filled 2022-08-09 (×2): qty 1

## 2022-08-09 MED ORDER — PANTOPRAZOLE SODIUM 40 MG PO TBEC
40.0000 mg | DELAYED_RELEASE_TABLET | Freq: Every day | ORAL | Status: DC
  Administered 2022-08-09 – 2022-08-10 (×2): 40 mg via ORAL
  Filled 2022-08-09 (×2): qty 1

## 2022-08-09 MED ORDER — LOSARTAN POTASSIUM 25 MG PO TABS
25.0000 mg | ORAL_TABLET | Freq: Every day | ORAL | Status: DC
  Administered 2022-08-09 – 2022-08-10 (×2): 25 mg via ORAL
  Filled 2022-08-09 (×2): qty 1

## 2022-08-09 MED ORDER — HEPARIN (PORCINE) IN NACL 1000-0.9 UT/500ML-% IV SOLN
INTRAVENOUS | Status: DC | PRN
  Administered 2022-08-09 (×2): 500 mL

## 2022-08-09 MED ORDER — NITROGLYCERIN 1 MG/10 ML FOR IR/CATH LAB
INTRA_ARTERIAL | Status: AC
  Filled 2022-08-09: qty 10

## 2022-08-09 MED ORDER — FENTANYL CITRATE (PF) 100 MCG/2ML IJ SOLN
INTRAMUSCULAR | Status: DC | PRN
  Administered 2022-08-09: 50 ug via INTRAVENOUS
  Administered 2022-08-09 (×2): 25 ug via INTRAVENOUS

## 2022-08-09 MED ORDER — LINACLOTIDE 145 MCG PO CAPS
145.0000 ug | ORAL_CAPSULE | Freq: Every day | ORAL | Status: DC
  Administered 2022-08-10: 145 ug via ORAL
  Filled 2022-08-09: qty 1

## 2022-08-09 MED ORDER — ASPIRIN 81 MG PO CHEW
81.0000 mg | CHEWABLE_TABLET | Freq: Every day | ORAL | Status: DC
  Administered 2022-08-10: 81 mg via ORAL
  Filled 2022-08-09 (×2): qty 1

## 2022-08-09 MED ORDER — ALBUTEROL SULFATE (2.5 MG/3ML) 0.083% IN NEBU: 2.5000 mg | INHALATION_SOLUTION | Freq: Four times a day (QID) | RESPIRATORY_TRACT | Status: DC | PRN

## 2022-08-09 MED ORDER — INSULIN GLARGINE-YFGN 100 UNIT/ML ~~LOC~~ SOLN
60.0000 [IU] | Freq: Every day | SUBCUTANEOUS | Status: DC
  Administered 2022-08-09: 60 [IU] via SUBCUTANEOUS
  Filled 2022-08-09 (×2): qty 0.6

## 2022-08-09 MED ORDER — FAMOTIDINE IN NACL 20-0.9 MG/50ML-% IV SOLN
INTRAVENOUS | Status: AC
  Filled 2022-08-09: qty 50

## 2022-08-09 MED ORDER — NITROGLYCERIN 1 MG/10 ML FOR IR/CATH LAB
INTRA_ARTERIAL | Status: DC | PRN
  Administered 2022-08-09: 200 ug via INTRACORONARY

## 2022-08-09 MED ORDER — SODIUM CHLORIDE 0.9 % WEIGHT BASED INFUSION: 1.0000 mL/kg/h | INTRAVENOUS | Status: DC

## 2022-08-09 MED ORDER — CLOPIDOGREL BISULFATE 300 MG PO TABS
ORAL_TABLET | ORAL | Status: AC
  Filled 2022-08-09: qty 2

## 2022-08-09 MED ORDER — LIDOCAINE HCL (PF) 1 % IJ SOLN
INTRAMUSCULAR | Status: AC
  Filled 2022-08-09: qty 30

## 2022-08-09 MED ORDER — GABAPENTIN 300 MG PO CAPS
300.0000 mg | ORAL_CAPSULE | Freq: Three times a day (TID) | ORAL | Status: DC
  Administered 2022-08-09 – 2022-08-10 (×2): 300 mg via ORAL
  Filled 2022-08-09 (×2): qty 1

## 2022-08-09 MED ORDER — SODIUM CHLORIDE 0.9% FLUSH: 3.0000 mL | INTRAVENOUS | Status: DC | PRN

## 2022-08-09 MED ORDER — ATORVASTATIN CALCIUM 40 MG PO TABS
40.0000 mg | ORAL_TABLET | Freq: Every day | ORAL | Status: DC
  Administered 2022-08-09: 40 mg via ORAL
  Filled 2022-08-09: qty 1

## 2022-08-09 MED ORDER — GLIPIZIDE 10 MG PO TABS
10.0000 mg | ORAL_TABLET | Freq: Two times a day (BID) | ORAL | Status: DC
  Administered 2022-08-10: 10 mg via ORAL
  Filled 2022-08-09 (×2): qty 1

## 2022-08-09 MED ORDER — FENTANYL CITRATE (PF) 100 MCG/2ML IJ SOLN
INTRAMUSCULAR | Status: AC
  Filled 2022-08-09: qty 2

## 2022-08-09 SURGICAL SUPPLY — 15 items
BALLN WOLVERINE 3.50X10 (BALLOONS) ×1
BALLOON WOLVERINE 3.50X10 (BALLOONS) IMPLANT
CATH INFINITI 5 FR JL3.5 (CATHETERS) IMPLANT
CATH INFINITI 5FR JK (CATHETERS) IMPLANT
CATH VISTA GUIDE 6FR XB3.5 (CATHETERS) IMPLANT
DEVICE RAD COMP TR BAND LRG (VASCULAR PRODUCTS) IMPLANT
GLIDESHEATH SLEND A-KIT 6F 22G (SHEATH) IMPLANT
GUIDEWIRE INQWIRE 1.5J.035X260 (WIRE) IMPLANT
INQWIRE 1.5J .035X260CM (WIRE) ×1
KIT ENCORE 26 ADVANTAGE (KITS) IMPLANT
KIT HEART LEFT (KITS) ×1 IMPLANT
PACK CARDIAC CATHETERIZATION (CUSTOM PROCEDURE TRAY) ×1 IMPLANT
TRANSDUCER W/STOPCOCK (MISCELLANEOUS) ×1 IMPLANT
TUBING CIL FLEX 10 FLL-RA (TUBING) ×1 IMPLANT
WIRE COUGAR XT STRL 190CM (WIRE) IMPLANT

## 2022-08-09 NOTE — Progress Notes (Signed)
TR BAND REMOVAL  LOCATION:    Right radial  DEFLATED PER PROTOCOL:    Yes.    TIME BAND OFF / DRESSING APPLIED:    1800p a clean dry dressing applied with gazue and tegaderm  SITE UPON ARRIVAL:    Level 0  SITE AFTER BAND REMOVAL:    Level 0  CIRCULATION SENSATION AND MOVEMENT:    Within Normal Limits   Yes.    COMMENTS:   Care instruction given

## 2022-08-09 NOTE — Plan of Care (Signed)

## 2022-08-09 NOTE — Interval H&P Note (Signed)
History and Physical Interval Note:  08/09/2022 3:35 PM  Julie Robertson  has presented today for surgery, with the diagnosis of positive stress test.  The various methods of treatment have been discussed with the patient and family. After consideration of risks, benefits and other options for treatment, the patient has consented to  Procedure(s): LEFT HEART CATH AND CORONARY ANGIOGRAPHY (N/A) CORONARY BALLOON ANGIOPLASTY (N/A) as a surgical intervention.  The patient's history has been reviewed, patient examined, no change in status, stable for surgery.  I have reviewed the patient's chart and labs.  Questions were answered to the patient's satisfaction.    Cath Lab Visit (complete for each Cath Lab visit)  Clinical Evaluation Leading to the Procedure:   ACS: No.  Non-ACS:    Anginal Classification: CCS III  Anti-ischemic medical therapy: Minimal Therapy (1 class of medications)  Non-Invasive Test Results: Intermediate-risk stress test findings: cardiac mortality 1-3%/year  Prior CABG: No previous CABG   Julie Robertson

## 2022-08-10 ENCOUNTER — Encounter (HOSPITAL_COMMUNITY): Payer: Self-pay | Admitting: Cardiology

## 2022-08-10 ENCOUNTER — Other Ambulatory Visit (HOSPITAL_COMMUNITY): Payer: Self-pay

## 2022-08-10 DIAGNOSIS — I25118 Atherosclerotic heart disease of native coronary artery with other forms of angina pectoris: Secondary | ICD-10-CM | POA: Diagnosis not present

## 2022-08-10 DIAGNOSIS — E782 Mixed hyperlipidemia: Secondary | ICD-10-CM

## 2022-08-10 DIAGNOSIS — Z8673 Personal history of transient ischemic attack (TIA), and cerebral infarction without residual deficits: Secondary | ICD-10-CM

## 2022-08-10 DIAGNOSIS — E781 Pure hyperglyceridemia: Secondary | ICD-10-CM

## 2022-08-10 DIAGNOSIS — Q2112 Patent foramen ovale: Secondary | ICD-10-CM

## 2022-08-10 LAB — BASIC METABOLIC PANEL
Anion gap: 8 (ref 5–15)
BUN: 11 mg/dL (ref 6–20)
CO2: 23 mmol/L (ref 22–32)
Calcium: 8.7 mg/dL — ABNORMAL LOW (ref 8.9–10.3)
Chloride: 99 mmol/L (ref 98–111)
Creatinine, Ser: 0.76 mg/dL (ref 0.44–1.00)
GFR, Estimated: >60 mL/min (ref >60)
Glucose, Bld: 253 mg/dL — ABNORMAL HIGH (ref 70–99)
Potassium: 3.7 mmol/L (ref 3.5–5.1)
Sodium: 130 mmol/L — ABNORMAL LOW (ref 135–145)

## 2022-08-10 LAB — CBC
HCT: 32 % — ABNORMAL LOW (ref 36.0–46.0)
Hemoglobin: 9.6 g/dL — ABNORMAL LOW (ref 12.0–15.0)
MCH: 21.3 pg — ABNORMAL LOW (ref 26.0–34.0)
MCHC: 30 g/dL (ref 30.0–36.0)
MCV: 71.1 fL — ABNORMAL LOW (ref 80.0–100.0)
Platelets: 350 10*3/uL (ref 150–400)
RBC: 4.5 MIL/uL (ref 3.87–5.11)
RDW: 15.4 % (ref 11.5–15.5)
WBC: 5.4 10*3/uL (ref 4.0–10.5)
nRBC: 0 % (ref 0.0–0.2)

## 2022-08-10 LAB — HEMOGLOBIN A1C
Hgb A1c MFr Bld: 11.1 % — ABNORMAL HIGH (ref 4.8–5.6)
Mean Plasma Glucose: 271.87 mg/dL

## 2022-08-10 LAB — GLUCOSE, CAPILLARY
Glucose-Capillary: 263 mg/dL — ABNORMAL HIGH (ref 70–99)
Glucose-Capillary: 299 mg/dL — ABNORMAL HIGH (ref 70–99)

## 2022-08-10 MED ORDER — CLOPIDOGREL BISULFATE 75 MG PO TABS
75.0000 mg | ORAL_TABLET | Freq: Every day | ORAL | 1 refills | Status: DC
  Filled 2022-08-10: qty 90, 90d supply, fill #0

## 2022-08-10 MED ORDER — ATORVASTATIN CALCIUM 40 MG PO TABS
40.0000 mg | ORAL_TABLET | Freq: Every day | ORAL | 3 refills | Status: DC
  Filled 2022-08-10 – 2022-10-12 (×2): qty 90, 90d supply, fill #0
  Filled 2023-01-12: qty 90, 90d supply, fill #1
  Filled 2023-04-13: qty 90, 90d supply, fill #2
  Filled 2023-07-03 – 2023-07-11 (×3): qty 90, 90d supply, fill #0

## 2022-08-10 MED ORDER — METFORMIN HCL 500 MG PO TABS: 1000.0000 mg | ORAL_TABLET | Freq: Two times a day (BID) | ORAL | 3 refills | Status: DC

## 2022-08-10 MED ORDER — ONDANSETRON HCL 4 MG/2ML IJ SOLN: 4.0000 mg | Freq: Four times a day (QID) | INTRAMUSCULAR | Status: DC | PRN

## 2022-08-10 MED ORDER — SODIUM CHLORIDE 0.9% FLUSH: 3.0000 mL | INTRAVENOUS | Status: DC | PRN

## 2022-08-10 MED ORDER — SODIUM CHLORIDE 0.9% FLUSH: 3.0000 mL | Freq: Two times a day (BID) | INTRAVENOUS | Status: DC

## 2022-08-10 MED ORDER — ASPIRIN 81 MG PO CHEW
81.0000 mg | CHEWABLE_TABLET | Freq: Every day | ORAL | 3 refills | Status: DC
  Filled 2022-08-10: qty 90, 90d supply, fill #0

## 2022-08-10 MED ORDER — PANTOPRAZOLE SODIUM 40 MG PO TBEC
40.0000 mg | DELAYED_RELEASE_TABLET | Freq: Every day | ORAL | 1 refills | Status: DC
  Filled 2022-08-10: qty 30, 30d supply, fill #0

## 2022-08-10 MED ORDER — SODIUM CHLORIDE 0.9 % IV SOLN: 250.0000 mL | INTRAVENOUS | Status: DC | PRN

## 2022-08-10 MED ORDER — ACETAMINOPHEN 325 MG PO TABS: 650.0000 mg | ORAL_TABLET | ORAL | Status: DC | PRN

## 2022-08-10 NOTE — Inpatient Diabetes Management (Signed)
Inpatient Diabetes Program Recommendations  AACE/ADA: New Consensus Statement on Inpatient Glycemic Control (2015)  Target Ranges:  Prepandial:   less than 140 mg/dL      Peak postprandial:   less than 180 mg/dL (1-2 hours)      Critically ill patients:  140 - 180 mg/dL   Lab Results  Component Value Date   GLUCAP 263 (H) 08/10/2022   HGBA1C 11.1 (H) 08/10/2022    Review of Glycemic Control  Diabetes history: DM 2 Outpatient Diabetes medications: Trulicity 1.5 mg weekly, Glipizide 10 mg bid, Humalog 20 units tid, Lantus 70 units qhs Current orders for Inpatient glycemic control:  Glipizide 10 mg bid Semglee 60 units qhs Novolog 14 units tid  A1c 11.1% this admission PCP: [facility]  Spoke with pt at bedside via interpreter Antony Madura # [phone]) regarding A1c level of 11.1% and glucose control at home. Pt reports compliance with medications, however, she only checks her glucose when she feels like her sugar is high. Pt reports her last A1c was between a 9-10%. Discussed glucose and A1c goals. Stressed importance of glucose control on cardiovascular system. Encouraged pt to follow up and call PCP office for insulin titration every few days if glucose trends remained >200. Encouraged pt to check glucose at least 2 times a day. Encouraged lifestyle modifications and consistency. Pt with no questions at this time.  Thanks,  Tama Headings RN, MSN, BC-ADM Inpatient Diabetes Coordinator Team Pager [phone] (8a-5p)

## 2022-08-10 NOTE — Progress Notes (Addendum)
AVS given and reviewed with pt and husband at bedside via [place], [place] interpreter. Medications discussed. All questions answered to satisfaction. Pt verbalized understanding of information given. Pt escorted off the unit with all belongings via wheelchair by volunteer services. TOC meds delivered by pharmacy.

## 2022-08-10 NOTE — Discharge Summary (Signed)
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  Ao 07/04/2010/72, mean 90 mmHg.  No pressure gradient across the aortic valve. LM: Large-caliber vessel.  Smooth and normal. LAD: Large-caliber vessel.  It is smooth and normal.  Gives origin to a very large diagonal 1 with a secondary branch.  LAD ends before reaching the apex. LCx: Large-caliber vessel.  Gives origin to a very tiny OM1, continues as large OM 2 and 3.  There is a focal 90% concentric stenosis. RCA: Large-caliber vessel.  Slow flow is noted in the right coronary artery.  Mid segment has a minimal luminal irregularity.   Intervention data: Successful PTCA and scoring balloon angioplasty with a 3.5 x 10 mm Wolverine cutting balloon.  Stenosis reduced from 90% to 0% with TIMI-3 to TIMI-3 flow, much better flow postangioplasty.      Recommendation: Patient will need at least 3 months of Plavix, aspirin indefinitely.  EKG: EKG 08/09/2022: Normal sinus rhythm at rate of 64 bpm, normal EKG.  Compared to 07/07/2022, no significant change.  Hospital Course:Julie Robertson is a 47 y.o. 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  Workup included transesophageal echocardiogram and transcranial Doppler both of which were positive for PFO.  Due to chest pain, underwent nuclear stress test which revealed lateral  ischemia, intermediate restudy. In view of significant cardiovascular risks which are high risk presentation, she was recommended cardiac catheterization.   Patient underwent elective coronary angiography and was found to have a very high-grade large circumflex stenosis for which she underwent scoring balloon angioplasty with Wolverine balloon with excellent results, she was kept overnight for observation and this morning as she remains chest pain-free, EKG no new changes, aspirin stable for discharge.  Recommendations on discharge: She will need aspirin indefinitely, Plavix for at least 3 months, preferably 6 months.  I have switched her from omeprazole to pantoprazole in view of Plavix interaction.   She had discontinued statins, this was discussed with the patient and she is going to need to be back on a statin, previously was on Lipitor 40 mg and continue fenofibrate.   Weight loss was discussed with the patient, diabetes mellitus, reduction in calories was discussed with the patient and her daughter.  Referral made for cardiac rehab.  Information was conveyed to the patient using [employer] interpretation services and husband was also present at bedside.  Questions and concerns addressed to their satisfaction.  Discharge Exam: Temp:  [97.2 F (36.2 C)-98.2 F (36.8 C)] 97.7 F (36.5 C) (02/07 0806) Pulse Rate:  [57-90] 72 (02/07 0839) Cardiac Rhythm: Normal sinus rhythm (02/07 0832) Resp:  [16-26] 20 (02/07 0806) BP: (110-150)/(54-84) 138/68 (02/07 0806) SpO2:  [94 %-100 %] 96 % (02/07 0806) Weight:  [113.4 kg] 113.4 kg (02/06 1328)  Today's Vitals   08/10/22 0000 08/10/22 0400 08/10/22 0806 08/10/22 0839  BP:  132/69 138/68   Pulse:  75 74 72  Resp:  18 20   Temp:  98.2 F (36.8 C) 97.7 F (36.5 C)  TempSrc:  Oral Axillary   SpO2:  98% 96%   Weight:      Height:      PainSc: 0-No pain 0-No pain     Body mass index is 41.6 kg/m.    Wt Readings from Last 3 Encounters:   08/09/22 113.4 kg  07/25/22 119.7 kg  07/13/22 118.8 kg   Net IO Since Admission: -1,100 mL [08/10/22 1112]   Physical Exam Constitutional:      Appearance: She is morbidly obese.  Neck:     Vascular: No carotid bruit or JVD.  Cardiovascular:     Rate and Rhythm: Normal rate and regular rhythm.     Pulses: Intact distal pulses.     Heart sounds: Normal heart sounds. No murmur heard.    No gallop.  Pulmonary:     Effort: Pulmonary effort is normal.     Breath sounds: Normal breath sounds.  Abdominal:     General: Bowel sounds are normal.     Palpations: Abdomen is soft.     Comments: Pannus present  Musculoskeletal:     Right lower leg: No edema.     Left lower leg: No edema.     Comments: Right radial site is healed well.  No hematoma or bruits present   Labs:   Lab Results  Component Value Date   WBC 5.4 08/10/2022   HGB 9.6 (L) 08/10/2022   HCT 32.0 (L) 08/10/2022   MCV 71.1 (L) 08/10/2022   PLT 350 08/10/2022    Recent Labs  Lab 08/10/22 0549  NA 130*  K 3.7  CL 99  CO2 23  BUN 11  CREATININE 0.76  CALCIUM 8.7*  GLUCOSE 253*    Lipid Panel     Component Value Date/Time   CHOL 126 07/21/2022 1010   TRIG 218 (H) 07/21/2022 1010   HDL 26 (L) 07/21/2022 1010   CHOLHDL 6.0 12/21/2021 0925   VLDL 45 (H) 12/21/2021 0925   LDLCALC 64 07/21/2022 1010    BNP (last 3 results) No results for input(s): "BNP" in the last 8760 hours.  HEMOGLOBIN A1C Lab Results  Component Value Date   HGBA1C 11.1 (H) 08/10/2022   MPG 271.87 08/10/2022   Lab Results  Component Value Date   TSH 3.090 03/22/2018     FOLLOW UP PLANS AND APPOINTMENTS Discharge Instructions     Amb Referral to Cardiac Rehabilitation   Complete by: As directed    Diagnosis: PTCA   After initial evaluation and assessments completed: Virtual Based Care may be provided alone or in conjunction with Phase 2 Cardiac Rehab based on patient barriers.: Yes   Intensive Cardiac Rehabilitation  (ICR) [facility] location only OR Traditional Cardiac Rehabilitation (TCR) *If criteria for ICR are not met will enroll in TCR [facility] only): Yes      Allergies as of 08/10/2022   No Known Allergies      Medication List     STOP taking these medications    hydrocortisone cream 0.5 %   omeprazole 20 MG capsule Commonly known as: PRILOSEC Replaced by: pantoprazole 40 MG tablet       TAKE these medications    Accu-Chek Guide w/Device Kit Use up to four times daily as directed.   Accu-Chek Softclix Lancets lancets Use como se indica. (use as directed)   albuterol 108 (90 Base) MCG/ACT inhaler Commonly known as: VENTOLIN HFA Inhale 2 puffs into the lungs every 6 (six) hours as needed for wheezing or shortness of  breath.   aspirin 81 MG chewable tablet Chew 1 tablet (81 mg total) by mouth daily.   atorvastatin 40 MG tablet Commonly known as: Lipitor Tome 1 tableta (40 mg en total) por va oral antes de acostarse. (Take 1 tablet (40 mg total) by mouth at bedtime.)   Basaglar KwikPen 100 UNIT/ML Inject 60 Units into the skin daily. What changed:  how much to take when to take this   clopidogrel 75 MG tablet Commonly known as: PLAVIX Tome 1 tableta (75 mg en total) por va oral diariamente con el desayuno. (Take 1 tablet (75 mg total) by mouth daily with breakfast.)   fenofibrate 160 MG tablet Take 1 tablet (160 mg total) by mouth daily.   gabapentin 300 MG capsule Commonly known as: NEURONTIN Take 1 capsule (300 mg total) by mouth 3 (three) times daily.   glipiZIDE 10 MG tablet Commonly known as: GLUCOTROL Take 1 tablet (10 mg total) by mouth 2 (two) times daily.   glucose blood test strip Commonly known as: True Metrix Blood Glucose Test Use as instructed   Accu-Chek Guide test strip Generic drug: glucose blood Use como se indica. (use as directed)   insulin lispro 100 UNIT/ML injection Commonly known as: HUMALOG Inject 20 Units into the skin 3 (three) times  daily before meals. Sliding scale   Jardiance 25 MG Tabs tablet Generic drug: empagliflozin Tome 1 tableta (25 mg en total) por va oral diariamente antes de desayunar. (Take 1 tablet (25 mg total) by mouth daily before breakfast.)   linaclotide 145 MCG Caps capsule Commonly known as: LINZESS Take 1 capsule (145 mcg total) by mouth daily before breakfast.   losartan 25 MG tablet Commonly known as: COZAAR Tome 1 tableta (25 mg en total) por va oral diariamente. (TAKE 1 TABLET (25 MG TOTAL) BY MOUTH DAILY.)   metFORMIN 500 MG tablet Commonly known as: GLUCOPHAGE Take 2 tablets (1,000 mg total) by mouth 2 (two) times daily with a meal.   metoprolol succinate 25 MG 24 hr tablet Commonly known as: Toprol XL Tome 1 tableta (25 mg en total) por va oral diariamente. (Take 1 tablet (25 mg total) by mouth daily.)   nitroGLYCERIN 0.4 MG SL tablet Commonly known as: Nitrostat Place 1 tablet (0.4 mg total) under the tongue every 5 (five) minutes as needed for chest pain. If you require more than two tablets five minutes apart go to the nearest ER via EMS.   NovoLOG FlexPen 100 UNIT/ML FlexPen Generic drug: insulin aspart Inject 14 Units into the skin 3 (three) times daily with meals.   pantoprazole 40 MG tablet Commonly known as: PROTONIX Tome 1 tableta (40 mg en total) por va oral diariamente antes de desayunar. (Stop taking omeprazole). (Take 1 tablet (40 mg total) by mouth daily before breakfast. (Stop taking omeprazole).) Replaces: omeprazole 20 MG capsule   TechLite Pen Needles 32G X 4 MM Misc Generic drug: Insulin Pen Needle Use como se indica. (USE AS DIRECTED)   tiZANidine 4 MG tablet Commonly known as: Zanaflex Take 1 tablet (4 mg total) by mouth every 8 (eight) hours as needed for muscle spasms.   True Metrix Meter w/Device Kit Use as directed   True Metrix Meter w/Device Kit use up to 4 times daily as directed   Trulicity 1.5 GE/9.5MW Sopn Generic drug:  Dulaglutide Inject 1.5 mg into the skin once a week.   Trulicity 4.13 KG/4.0NU Sopn Generic drug: Dulaglutide Inject 0.75 mg into the skin once a week.  For 4 weeks then increase to 1.5 mg thereafter        Follow-up Information     Verania Salberg, DO Follow up on 08/18/2022.   Specialties: Cardiology, Vascular Surgery Why: 08/18/22 '@3pm'$ . Please bring all your medications Contact information: [address]  [place] [address] [phone]                Total time spent on patient's discharge was 39 minutes.  Rex Kras, [place], [facility]  Pager: [phone] Office: [phone]

## 2022-08-10 NOTE — Progress Notes (Signed)
CARDIAC REHAB PHASE I   PRE:  Rate/Rhythm: 76 SR    BP: sitting 145/75    SaO2: 98 RA  MODE:  Ambulation: 400 ft   POST:  Rate/Rhythm: 96 SR    BP: sitting 139/100      SaO2: 98 RA  Pt ambulated hall without c/o except feeling weak. Tolerated well.   Discussed with pt through interpreter Plavix, restrictions, smoking cessation (quit 6 months), diet, exercise, NTG and CRPII. Pt receptive, knows she needs to make change. Will refer to [facility] however pt voices she would rather exercise at home to avoid cost.  [phone]   Julie Robertson BS, ACSM-CEP 08/10/2022 10:07 AM

## 2022-08-11 ENCOUNTER — Telehealth: Payer: Self-pay

## 2022-08-11 LAB — LIPOPROTEIN A (LPA): Lipoprotein (a): 83.1 nmol/L — ABNORMAL HIGH (ref <75.0)

## 2022-08-11 NOTE — Telephone Encounter (Signed)
From the discharge call:   She said she is just a little tired. She also stated that her arms are painful but the staff at the hospital told her they could hurt after her procedure   [facility] called her and scheduled her for an appointment on 01/18/2023.  She would like to know if there is another provider that could see her sooner. She does not want to wait until July. I told her that I would ask our referral coordinator if the referral could be sent anywhere else.   she said she has all of her medications and did not have any questions about the med regime. She has a glucometer but said it gets " stuck" sometimes.  She would like to know if she can get a glucometer like the one she used to have.  I told her to bring her meter to her appointment with Lurena Joiner, [facility]  this Friday, 2/9.   Scheduled to see Dr Margarita Rana- 10/12/2022.

## 2022-08-11 NOTE — Telephone Encounter (Signed)
Transition Care Management Follow-up Telephone Call  Call completed with assistance of Spanish Interpreter: [id number]/[employer] Date of discharge and from where: 08/10/2022, [facility]  How have you been since you were released from the hospital? She said she is just a little tired. She also stated that her arms are painful but the staff at the hospital told her they could hurt after her procedure  Any questions or concerns? Yes- [facility] called her and scheduled her for an appointment on 01/18/2023.  She would like to know if there is another provider that could see her sooner. She does not want to wait until July. I told her that I would ask our referral coordinator if the referral could be sent anywhere else.   Items Reviewed: Did the pt receive and understand the discharge instructions provided? Yes  Medications obtained and verified? Yes - she said she has all of her medications and did not have any questions about the med regime. She has a glucometer but said it gets " stuck" sometimes.  She would like to know if she can get a glucometer like the one she used to have.  I told her to bring her meter to her appointment with Lurena Joiner, [facility]  this Friday, 2/9.  Other? No  Any new allergies since your discharge? No  Dietary orders reviewed? No Do you have support at home? Yes - currently staying with her daughter in [place].    Home Care and Equipment/Supplies: Were home health services ordered? no If so, what is the name of the agency? N/a  Has the agency set up a time to come to the patient's home? not applicable Were any new equipment or medical supplies ordered?  No What is the name of the medical supply agency? N/a Were you able to get the supplies/equipment? not applicable Do you have any questions related to the use of the equipment or supplies? No  Functional Questionnaire: (I = Independent and D = Dependent) ADLs: independent  Follow up appointments reviewed:  PCP  Hospital f/u appt confirmed? Yes  Scheduled to see Dr Margarita Rana- 10/12/2022.  [facility] f/u appt confirmed? Yes  Scheduled to see Benard Halsted, [facility] - 08/12/2022, cardiology- 08/18/2022.   Are transportation arrangements needed? No - her daughter will need to arrange a ride for her. I also provided her with the phone number for [facility] Medicaid transportation.  If their condition worsens, is the pt aware to call PCP or go to the Emergency Dept.? Yes Was the patient provided with contact information for the PCP's office or ED? Yes Was to pt encouraged to call back with questions or concerns? Yes

## 2022-08-12 ENCOUNTER — Ambulatory Visit: Payer: Medicaid Other | Attending: Family Medicine | Admitting: Pharmacist

## 2022-08-12 ENCOUNTER — Encounter: Payer: Self-pay | Admitting: Pharmacist

## 2022-08-12 ENCOUNTER — Other Ambulatory Visit: Payer: Self-pay | Admitting: Family Medicine

## 2022-08-12 ENCOUNTER — Other Ambulatory Visit: Payer: Self-pay

## 2022-08-12 ENCOUNTER — Other Ambulatory Visit (HOSPITAL_COMMUNITY): Payer: Self-pay

## 2022-08-12 DIAGNOSIS — E1169 Type 2 diabetes mellitus with other specified complication: Secondary | ICD-10-CM

## 2022-08-12 DIAGNOSIS — Z794 Long term (current) use of insulin: Secondary | ICD-10-CM | POA: Diagnosis not present

## 2022-08-12 MED ORDER — FENOFIBRATE 145 MG PO TABS
145.0000 mg | ORAL_TABLET | Freq: Every day | ORAL | 1 refills | Status: DC
  Filled 2022-08-12: qty 90, 90d supply, fill #0
  Filled 2022-11-07: qty 90, 90d supply, fill #1

## 2022-08-12 MED ORDER — SEMAGLUTIDE(0.25 OR 0.5MG/DOS) 2 MG/3ML ~~LOC~~ SOPN
0.2500 mg | PEN_INJECTOR | SUBCUTANEOUS | 2 refills | Status: DC
  Filled 2022-08-12: qty 3, 28d supply, fill #0
  Filled 2022-08-30 – 2022-09-02 (×2): qty 3, 28d supply, fill #1

## 2022-08-12 NOTE — Progress Notes (Signed)
    S:     No chief complaint on file.  47 y.o. female who presents for diabetes evaluation, education, and management.  PMH is significant for left occipital lobe stroke in 12/2021, PFO, type 2 diabetes mellitus (A1c 10.4), dyslipidemia, GERD, nicotine dependence.  Patient was referred and last seen by Primary Care Provider, Dr. Margarita Rana, on 07/13/2022. Trulicity was added at that visit.  Today, patient arrives in good spirits and presents without any assistance. She tells me that she ran out of Trulicity several weeks ago and wants to go back to [facility]. Her insurance has changed and she was told that Ozempic is covered.  Family/Social History:  Fhx: DM, heart disease, HTN Tobacco: former smoker  Alcohol: none reported  Current diabetes medications include: Basaglar 60u daily, Novolog 14u TID (will sometimes tak 20u TID. Sometimes she injects BID when she only eats two meals), Trulicity 0.'75mg'$  weekly (ran out ~1 month ago - wishes to restart Ozempic), Jardiance 25 mg daily, metformin 1000 mg BID (takes two 500 mg tablets BID).   Insurance coverage: [place] Medicaid  Patient denies hypoglycemic events.  Reported home fasting blood sugars: none   Reported 2 hour post-meal/random blood sugars: none No CGM in place or GM with her today.  Patient denies nocturia (nighttime urination).  Patient reports neuropathy (nerve pain). Patient reports visual changes. Patient reports self foot exams.   Patient reported dietary habits:  -Eats 2-3 meals daily  -Eats "anything I can eat"   Patient-reported exercise habits:  -None   O:   ROS  Physical Exam  7 day average blood glucose: no meter with her today.   No CGM in place.  Lab Results  Component Value Date   HGBA1C 11.1 (H) 08/10/2022   There were no vitals filed for this visit.  Lipid Panel     Component Value Date/Time   CHOL 126 07/21/2022 1010   TRIG 218 (H) 07/21/2022 1010   HDL 26 (L) 07/21/2022 1010   CHOLHDL 6.0  12/21/2021 0925   VLDL 45 (H) 12/21/2021 0925   LDLCALC 64 07/21/2022 1010   LDLDIRECT 64 07/21/2022 1010    Clinical Atherosclerotic Cardiovascular Disease (ASCVD): Yes  The ASCVD Risk score (Arnett DK, et al., 2019) failed to calculate for the following reasons:   The patient has a prior MI or stroke diagnosis   A/P: Diabetes longstanding currently uncontrolled. Patient is able to verbalize appropriate hypoglycemia management plan. Medication adherence appears suboptimal. I have advised that she take insulin as prescribed. We will restart Ozempic per pt request. She tolerated this well in the past.  -Discontinued Trulicity.  -Start Ozempic 0.25 mg weekly x4 weeks, then increase to 0.5 mg weekly. Will work with insurance to get a PA covered.  -Continued basal/bolus insulin regimen at current doses.  -Continued metformin 1000 mg BID.  -Patient educated on purpose, proper use, and potential adverse effects of Ozempic.  -Extensively discussed pathophysiology of diabetes, recommended lifestyle interventions, dietary effects on blood sugar control.  -Counseled on s/sx of and management of hypoglycemia.  -Next A1c anticipated 11/2022.   Written patient instructions provided. Patient verbalized understanding of treatment plan.  Total time in face to face counseling 30 minutes.    Follow-up:  Pharmacist in 4-6 weeks. PCP clinic visit 10/12/2022.   Benard Halsted, PharmD, [place], [place] [phone]

## 2022-08-18 ENCOUNTER — Ambulatory Visit: Payer: Medicaid Other | Admitting: Cardiology

## 2022-08-18 ENCOUNTER — Other Ambulatory Visit: Payer: Self-pay

## 2022-08-18 ENCOUNTER — Encounter: Payer: Self-pay | Admitting: Cardiology

## 2022-08-18 VITALS — BP 116/68 | HR 74 | Resp 18 | Ht 65.0 in | Wt 267.4 lb

## 2022-08-18 DIAGNOSIS — I10 Essential (primary) hypertension: Secondary | ICD-10-CM

## 2022-08-18 DIAGNOSIS — I253 Aneurysm of heart: Secondary | ICD-10-CM

## 2022-08-18 DIAGNOSIS — Z794 Long term (current) use of insulin: Secondary | ICD-10-CM

## 2022-08-18 DIAGNOSIS — I639 Cerebral infarction, unspecified: Secondary | ICD-10-CM

## 2022-08-18 DIAGNOSIS — Z87891 Personal history of nicotine dependence: Secondary | ICD-10-CM

## 2022-08-18 DIAGNOSIS — E785 Hyperlipidemia, unspecified: Secondary | ICD-10-CM

## 2022-08-18 DIAGNOSIS — I2510 Atherosclerotic heart disease of native coronary artery without angina pectoris: Secondary | ICD-10-CM

## 2022-08-18 DIAGNOSIS — R072 Precordial pain: Secondary | ICD-10-CM

## 2022-08-18 DIAGNOSIS — R0609 Other forms of dyspnea: Secondary | ICD-10-CM

## 2022-08-18 MED ORDER — METOPROLOL SUCCINATE ER 25 MG PO TB24
25.0000 mg | ORAL_TABLET | Freq: Every day | ORAL | 1 refills | Status: DC
  Filled 2022-08-18: qty 90, 90d supply, fill #0
  Filled 2022-11-07: qty 90, 90d supply, fill #1

## 2022-08-18 NOTE — Progress Notes (Unsigned)
ID:  Julie Robertson, DOB [birth year], MRN [id number]  PCP:  Charlott Rakes, MD  Cardiologist:  Rex Kras, DO, [facility] (established care 07/07/2022)  Date: 08/18/22 Last Office Visit: 07/25/2022  Chief Complaint  Patient presents with   Post-op Follow-up    cath    HPI  Julie Robertson is a 47 y.o. 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    Patient predominantly prefers to communicate in Spanish and translation services provided by medical assistance during today's encounter.  Patient was noted to have a stroke in June 2023 and workup included transesophageal echocardiogram and transcranial Doppler both of which were positive for PFO.  However due to insurance reasons was not able to follow-up after her stroke.  At the last office visit the shared decision was to proceed with a Zio patch to evaluate for possible A-fib given that prior strokes involved both the right and left hemispheres.  She is currently wearing a monitor results of forthcoming.  Shared decision was also to proceed with PFO closure if the workup is essentially unremarkable.  However, during the initial presentation patient was complaining of symptoms of precordial pain, left-sided, 7 out of 10 in intensity, better with resting and relaxing, usually brought on by stressful situations.  Given her precordial pain and multiple risk factors including prior stroke and insulin-dependent diabetes that shared decision was to proceed with exercise nuclear stress test.  Patient was asked to come in sooner as a nuclear stress test was abnormal suggestive of reversible ischemia in either LCx/RCA distribution.  Since last office visit patient  states that she has had couple episodes of precordial pain similar to her episode in December 2023.  She feels more tired and fatigued compared to her baseline and also dyspnea on exertion.  Patient stated that she stopped smoking she has gained weight exponentially.  She currently has a Event organiser..  3# up  Cardiac up - 2 days ago 8-9 am    FUNCTIONAL STATUS: No structured exercise program or daily routine.   ALLERGIES: No Known Allergies  MEDICATION LIST PRIOR TO VISIT: Current Meds  Medication Sig   albuterol (VENTOLIN HFA) 108 (90 Base) MCG/ACT inhaler Inhale 2 puffs into the lungs every 6 (six) hours as needed for wheezing or shortness of breath.   aspirin 81 MG chewable tablet Chew 1 tablet (81 mg total) by mouth daily.   atorvastatin (LIPITOR) 40 MG tablet Take 1 tablet (40 mg total) by mouth at bedtime.   blood glucose meter kit and supplies KIT Use up to four times daily as directed.   Blood Glucose Monitoring Suppl (TRUE METRIX METER) w/Device KIT Use as directed   Blood Glucose Monitoring Suppl (TRUE METRIX METER) w/Device KIT use up to 4 times daily as directed   fenofibrate (TRICOR) 145 MG tablet Take 1 tablet (145 mg total) by mouth daily.   gabapentin (NEURONTIN) 300 MG capsule Take 1 capsule (300 mg total) by mouth 3 (three) times daily.   glipiZIDE (GLUCOTROL) 10 MG tablet Take 1 tablet (10 mg total) by mouth 2 (two) times daily.   glucose blood (TRUE METRIX BLOOD GLUCOSE TEST) test strip Use as instructed   glucose blood test strip use as directed   insulin aspart (NOVOLOG FLEXPEN) 100 UNIT/ML  FlexPen Inject 14 Units into the skin 3 (three) times daily with meals.   insulin glargine (LANTUS SOLOSTAR) 100 UNIT/ML Solostar Pen Inject 60 Units into the skin daily. (Patient taking differently: Inject 70 Units into the skin at bedtime.)   insulin lispro (HUMALOG) 100 UNIT/ML injection Inject 20 Units into the skin 3 (three) times daily before meals. Sliding  scale   Insulin Pen Needle (TRUEPLUS 5-BEVEL PEN NEEDLES) 32G X 4 MM MISC USE AS DIRECTED   Lancets MISC use as directed   losartan (COZAAR) 25 MG tablet TAKE 1 TABLET (25 MG TOTAL) BY MOUTH DAILY.   metFORMIN (GLUCOPHAGE) 500 MG tablet Take 2 tablets (1,000 mg total) by mouth 2 (two) times daily with a meal.   metoprolol succinate (TOPROL XL) 25 MG 24 hr tablet Take 1 tablet (25 mg total) by mouth daily.   nitroGLYCERIN (NITROSTAT) 0.4 MG SL tablet Place 1 tablet (0.4 mg total) under the tongue every 5 (five) minutes as needed for chest pain. If you require more than two tablets five minutes apart go to the nearest ER via EMS.   tiZANidine (ZANAFLEX) 4 MG tablet Take 1 tablet (4 mg total) by mouth every 8 (eight) hours as needed for muscle spasms.     PAST MEDICAL HISTORY: Past Medical History:  Diagnosis Date   Anxiety    Asthma    CVA (cerebral vascular accident) ([place])    Diabetes mellitus without complication ([place])    Diabetic neuropathy ([place]) 12/2013   vs carpal tunnell.  numbness tingling in right fingers. rx with Gabapentin.   Dyslipidemia 08/2009   Dyspnea    Gall stones 08/2009   GERD (gastroesophageal reflux disease)    H/O tubal ligation    Obesity    BMI 41, 250# 03/2015    PAST SURGICAL HISTORY: Past Surgical History:  Procedure Laterality Date   APPLICATION OF WOUND VAC N/A 05/09/2019   Procedure: APPLICATION OF WOUND VAC;  Surgeon: Olean Ree, MD;  Location: [facility];  Service: General;  Laterality: [id number]   BUBBLE STUDY  12/24/2021   Procedure: BUBBLE STUDY;  Surgeon: Lelon Perla, MD;  Location: [place];  Service: Cardiovascular;;   CHOLECYSTECTOMY N/A 03/10/2015   Procedure: LAPAROSCOPIC CHOLECYSTECTOMY WITH INTRAOPERATIVE CHOLANGIOGRAM;  Surgeon: Georganna Skeans, MD;  Location: [place];  Service: General;  Laterality: N/A;   CORONARY BALLOON ANGIOPLASTY N/A 08/09/2022   Procedure: CORONARY BALLOON ANGIOPLASTY;  Surgeon: [name], MD;  Location:  [place] [facility];  Service: Cardiovascular;  Laterality: N/A;   ERCP N/A 03/11/2015   Procedure: ENDOSCOPIC RETROGRADE CHOLANGIOPANCREATOGRAPHY (ERCP);  Surgeon: Milus Banister, MD;  Location: [place];  Service: Endoscopy;  Laterality: N/A;   ESOPHAGOGASTRODUODENOSCOPY (EGD) WITH PROPOFOL N/A 07/25/2018   Procedure: ESOPHAGOGASTRODUODENOSCOPY (EGD) WITH PROPOFOL;  Surgeon: Rush Landmark Telford Nab., MD;  Location: [facility];  Service: Gastroenterology;  Laterality: N/A;   INSERTION OF MESH N/A 05/09/2019   Procedure: INSERTION OF MESH;  Surgeon: Olean Ree, MD;  Location: [facility];  Service: General;  Laterality: N/A;   LEFT HEART CATH AND CORONARY ANGIOGRAPHY N/A 08/09/2022   Procedure: LEFT HEART CATH AND CORONARY ANGIOGRAPHY;  Surgeon: [name], MD;  Location: [place] [facility];  Service: Cardiovascular;  Laterality: N/A;   TEE WITHOUT CARDIOVERSION N/A 12/24/2021   Procedure: TRANSESOPHAGEAL ECHOCARDIOGRAM (TEE);  Surgeon: Lelon Perla, MD;  Location: [facility];  Service: Cardiovascular;  Laterality: N/A;   Transduodenal Ampullectomy  09/2015   TUBAL LIGATION  VENTRAL HERNIA REPAIR N/A 05/09/2019   Procedure: HERNIA REPAIR VENTRAL ADULT with MESH;  Surgeon: Olean Ree, MD;  Location: [facility];  Service: General;  Laterality: N/A;    FAMILY HISTORY: The patient family history includes Bone cancer in her maternal grandfather; Diabetes in her father and mother; Heart attack in her father; Heart disease in her mother; Hypertension in her mother; Kidney disease in her maternal uncle; Other in her son. She was adopted.  SOCIAL HISTORY:  The patient  reports that she has quit smoking. Her smoking use included cigarettes. She has a 6.00 pack-year smoking history. She has never used smokeless tobacco. She reports current alcohol use. She reports that she does not use drugs.  REVIEW OF SYSTEMS: Review of Systems  Constitutional: Positive for malaise/fatigue and weight gain.   Cardiovascular:  Positive for dyspnea on exertion and palpitations. Negative for chest pain, claudication, irregular heartbeat, leg swelling, near-syncope, orthopnea, paroxysmal nocturnal dyspnea and syncope.  Respiratory:  Positive for shortness of breath.   Hematologic/Lymphatic: Negative for bleeding problem.  Musculoskeletal:  Negative for muscle cramps and myalgias.  Neurological:  Negative for dizziness and light-headedness.    PHYSICAL EXAM:    08/18/2022    3:08 PM 08/18/2022    3:03 PM 08/10/2022    8:39 AM  Vitals with BMI  Height  5' 5"$    Weight  267 lbs 6 oz   BMI  [id number]   Systolic [id number]   Diastolic 68 46   Pulse 74 81 72    Physical Exam  Constitutional: No distress.  Age appropriate, hemodynamically stable.   Neck: No JVD present.  Cardiovascular: Normal rate, regular rhythm, S1 normal, S2 normal, intact distal pulses and normal pulses. Exam reveals no gallop, no S3 and no S4.  No murmur heard. Pulmonary/Chest: Effort normal and breath sounds normal. No stridor. She has no wheezes. She has no rales.  Abdominal: Soft. Bowel sounds are normal. She exhibits no distension. There is no abdominal tenderness.  Obese  Musculoskeletal:        General: No edema.     Cervical back: Neck supple.  Neurological: She is alert and oriented to person, place, and time. She has intact cranial nerves (2-12).  Skin: Skin is warm and moist.   RADIOLOGY CT brain without contrast 12/20/2021: Chronic changes with no acute intracranial process identified.   MRI brain with and without contrast 12/21/2021: Brain MRI: 1. Patchy acute cortical infarcts in the left occipital lobe with extension along the posterior left watershed. 2. Remote perforator infarct the right corona radiata.   Cervical spine: 1. Normal motion degraded appearance of the cord. 2. C5-6 large right paracentral to foraminal protrusion encroaching on the right cord and causing right foraminal impingement.  CT angio  head and neck with and without contrast code stroke protocol 12/21/2021: 1. No emergent vascular finding. 2. Atherosclerosis without flow limiting stenosis of major vessels. 3. Left basal ganglia and left occipital cortex calcifications, are there risk factors for remote neurocysticercosis?  CT of the head without contrast 12/23/2021: 1. No acute intracranial hemorrhage. 2. Patchy left occipital lobe peau attenuation, compatible with known area of infarction.   CARDIAC DATABASE: EKG: 08/18/2022: Sinus rhythm, 75 bpm, without underlying ischemia or injury pattern.  Echocardiogram: 12/21/2021:  1. Left ventricular ejection fraction, by estimation, is 60 to 65%. The  left ventricle has normal function. The left ventricle has no regional  wall motion abnormalities. There is mild concentric left ventricular  hypertrophy. Left ventricular  diastolic  parameters were normal.   2. Right ventricular systolic function is normal. The right ventricular  size is normal. Tricuspid regurgitation signal is inadequate for assessing  PA pressure.   3. A small pericardial effusion is present. The pericardial effusion is  circumferential.   4. The mitral valve is normal in structure. No evidence of mitral valve  regurgitation. No evidence of mitral stenosis.   5. The aortic valve is tricuspid. There is mild thickening of the aortic  valve. Aortic valve regurgitation is not visualized. No aortic stenosis is  present.     Transesophageal echocardiogram 12/24/2021: Atrial septal aneurysm; no shunting noted with color doppler but positive saline microcavitation study suggests PFO. 2.Left ventricular ejection fraction, by estimation, is 55 to 60%. The left ventricle has normal function. The left ventricle has no regional wall motion abnormalities. 3. Right ventricular systolic function is normal. The right ventricular size is normal. 4. No left atrial/left atrial appendage thrombus was detected. 5. The mitral  valve is normal in structure. Trivial mitral valve regurgitation. The aortic valve is tricuspid. Aortic valve regurgitation is trivial. Aortic valve sclerosis is present, with no evidence of aortic valve stenosis. 6. Agitated saline contrast bubble study was positive with shunting observed within 3-6 cardiac cycles suggestive of interatrial shunt.  Stress Testing: Exercise nuclear stress test 07/19/2022: Myocardial perfusion is abnormal. Prominent breast attenuation in the inferior wall.  There is mild mid to apical anteroseptal thinning in stress images suggestive of a small reversible mild defect. TID ratio 1.43, which is abnormal. Stress LV EF is normal 52%. Overall LV systolic function is normal without regional wall motion abnormalities. Stress LV EF: 52%.  Normal ECG stress. The patient exercised for 4 minutes and 13 seconds of a Bruce protocol, achieving approximately 6.1 METs & 86% MPHR. NO chest pain, stress terminated due to THR achieved. Normal BP response.  No previous exam available for comparison. Intermediate risk due to abnormal TID ratio. Clinical correlation recommended in a patient with BMI >40. ~~Personally reviewed myocardial perfusion imaging which is more concerning for reversible ischemia in the basal to mid inferolateral and apical lateral segments suggestive of ischemia in the LCx distribution.  Heart Catheterization: None  Lower extremity venous duplex bilateral: 12/22/2021: - No evidence of deep vein thrombosis seen in the lower extremities, bilaterally.  -No evidence of popliteal cyst, bilaterally.   Transcranial Doppler with bubbles: 12/21/2021 Positive TCD Bubble study with valasalva only indicative of a small right to left shunt   LABORATORY DATA:    Latest Ref Rng & Units 08/10/2022    5:49 AM 07/21/2022   10:10 AM 12/22/2021    3:14 AM  CBC  WBC 4.0 - 10.5 K/uL 5.4  6.2  6.9   Hemoglobin 12.0 - 15.0 g/dL 9.6  10.5  9.5   Hematocrit 36.0 - 46.0 % 32.0   37.3  30.4   Platelets 150 - 400 K/uL 350  404  305        Latest Ref Rng & Units 08/10/2022    5:49 AM 07/21/2022   10:10 AM 07/13/2022    4:13 PM  CMP  Glucose 70 - 99 mg/dL 253  262  237   BUN 6 - 20 mg/dL 11  16  13   $ Creatinine 0.44 - 1.00 mg/dL 0.76  0.89  0.81   Sodium 135 - 145 mmol/L 130  135  138   Potassium 3.5 - 5.1 mmol/L 3.7  4.3  4.4   Chloride 98 -  111 mmol/L 99  101  103   CO2 22 - 32 mmol/L 23  22  20   $ Calcium 8.9 - 10.3 mg/dL 8.7  9.5  9.5   Total Protein 6.0 - 8.5 g/dL  7.6  7.8   Total Bilirubin 0.0 - 1.2 mg/dL  0.3  0.3   Alkaline Phos 44 - 121 IU/L  88  71   AST 0 - 40 IU/L  92  127   ALT 0 - 32 IU/L  105  95     Lipid Panel  Lab Results  Component Value Date   CHOL 126 07/21/2022   HDL 26 (L) 07/21/2022   LDLCALC 64 07/21/2022   LDLDIRECT 64 07/21/2022   TRIG 218 (H) 07/21/2022   CHOLHDL 6.0 12/21/2021   No components found for: "NTPROBNP" No results for input(s): "PROBNP" in the last 8760 hours. No results for input(s): "TSH" in the last 8760 hours.  BMP Recent Labs    12/20/21 1616 12/22/21 0314 07/13/22 1613 07/21/22 1010 08/10/22 0549  NA 136 136 138 135 130*  K 3.8 4.0 4.4 4.3 3.7  CL 103 106 103 101 99  CO2 20* 23 20 22 23  $ GLUCOSE 216* 285* 237* 262* 253*  BUN 16 16 13 16 11  $ CREATININE 0.81 0.92 0.81 0.89 0.76  CALCIUM 8.8* 8.6* 9.5 9.5 8.7*  GFRNONAA >60 >60  --   --  >60     HEMOGLOBIN A1C Lab Results  Component Value Date   HGBA1C 11.1 (H) 08/10/2022   MPG 271.87 08/10/2022    IMPRESSION:    ICD-10-CM   1. Abnormal nuclear stress test  R94.39 EKG 12-Lead    2. Precordial pain  R07.2 EKG 12-Lead    3. Dyspnea on exertion  R06.09 EKG 12-Lead    4. Type 2 diabetes mellitus with hyperglycemia, with long-term current use of insulin (HCC)  E11.65    Z79.4     5. Essential hypertension  I10     6. Dyslipidemia  E78.5     7. Cerebrovascular accident (CVA), unspecified mechanism ([place])  I63.9     8. PFO with  atrial septal aneurysm  Q21.12    I25.3     9. Former smoker  Z87.891     73. Class 3 severe obesity due to excess calories with serious comorbidity and body mass index (BMI) of 40.0 to 44.9 in adult [facility])  E66.01    Z68.41        RECOMMENDATIONS: Asalee Symington is a 47 y.o. 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  Abnormal nuclear stress test Precordial pain. Dyspnea on exertion Precordial pain suggestive of both cardiac and noncardiac features and also has dyspnea on exertion which is multifactorial. Prior EKG nonischemic. Echocardiogram from June 2023 illustrates preserved LVEF. Nuclear perfusion stress test personally reviewed.  Low functional capacity for age, low normal LVEF, TID per ratio, basal to mid inferolateral and apical lateral segments suggestive of possible ischemia in the LCx/RCA distribution. Given her symptoms, poorly controlled insulin diabetes, hx of stroke, 38-year pack history of smoking, uncontrolled lipids shared decision was to proceed with invasive angiography to evaluate for obstructive CAD. Start Toprol-XL 25 mg p.o. daily. Sublingual nitroglycerin tablets to use on a as needed basis. Cancel coronary calcium score as she will proceed  w/ angiogram.   The procedure of left heart catheterization with possible intervention was explained to the patient in detail.  The indication, alternatives, risks and benefits were reviewed.  Complications include but not limited to bleeding, infection, vascular injury, stroke, myocardial infarction, arrhythmia (requiring medical or cardiopulmonary resuscitation), kidney injury (requiring short-term or long-term hemodialysis), radiation-related injury in the case of prolonged  fluoroscopy use, emergent cardiac surgery, temporary or permanent pacemaker, and death. The patient understands the risks of serious complication is 1-2 in [id number] with diagnostic cardiac cath and 1-2% or less with angioplasty/stenting.  The patient  voices understanding and provides verbal feedback her questions and concerns are addressed to her satisfaction and patient wishes to proceed with coronary angiography with possible PCI.   Type 2 diabetes mellitus with hyperglycemia, with long-term current use of insulin (HCC) Uncontrolled. Most recent hemoglobin A1c greater than 10. Reemphasized importance of glycemic control. Currently on statin therapy, ARB, Farxiga, metformin. Fenofibrate was recently discontinued by PCP according to the patient due to LFTs. We emphasized the importance of reducing foods that are high in cholesterol and triglycerides.  Essential hypertension Office blood pressures within acceptable limits. Medications reconciled. Reemphasized the importance of glycemic control.  Dyslipidemia Currently on atorvastatin.   Fenofibrate held secondary to LFTs -will await clearance from PCP She denies myalgia or other side effects. Most recent lipids dated January 2024, independently reviewed as noted above. Currently managed by primary care provider.  Cerebrovascular accident (CVA), unspecified mechanism ([place]) PFO with atrial septal aneurysm Index event June 2023, per imaging left occipital and right coronary radiata region involved. Currently wearing a Zio patch. Stroke workup positive for PFO as per TEE and transcranial Doppler. Patient would like to have her PFO closed after ischemic workup is complete.  Former smoker 38-year pack history of smoking. Reemphasized the importance of complete smoking cessation. Quit in June 2023 after her stroke.  Class 3 severe obesity due to excess calories with serious comorbidity and body mass index (BMI) of 40.0 to 44.9 in adult  [facility]) Body mass index is 44.5 kg/m. I reviewed with her importance of diet, regular physical activity/exercise, weight loss.   Patient is educated on the importance of increasing physical activity gradually as tolerated with a goal of moderate intensity exercise for 30 minutes a day 5 days a week.  Until the workup is complete patient is encouraged not to overexert.  Educated on seeking medical attention sooner by going to the closest ER via EMS if the symptoms increase in intensity, frequency, duration, or has typical chest pain as discussed in the office.  Patient verbalized understanding.  Patient predominantly speaks Spanish and translation services provided by medical assistant during today's encounter.  Patient did not want me to discuss the plan of care with any other family members / next of kin.   FINAL MEDICATION LIST END OF ENCOUNTER: No orders of the defined types were placed in this encounter.   There are no discontinued medications.    Current Outpatient Medications:    albuterol (VENTOLIN HFA) 108 (90 Base) MCG/ACT inhaler, Inhale 2 puffs into the lungs every 6 (six) hours as needed for wheezing or shortness of breath., Disp: 18 g, Rfl: 1   aspirin 81 MG chewable tablet, Chew 1 tablet (81 mg total) by mouth daily., Disp: 90 tablet, Rfl: 3   atorvastatin (LIPITOR) 40 MG tablet, Take 1 tablet (40 mg total) by mouth at bedtime., Disp: 90 tablet, Rfl: 3   blood glucose meter kit and supplies  KIT, Use up to four times daily as directed., Disp: 1 each, Rfl: 11   Blood Glucose Monitoring Suppl (TRUE METRIX METER) w/Device KIT, Use as directed, Disp: 1 kit, Rfl: 0   Blood Glucose Monitoring Suppl (TRUE METRIX METER) w/Device KIT, use up to 4 times daily as directed, Disp: 1 kit, Rfl: 0   fenofibrate (TRICOR) 145 MG tablet, Take 1 tablet (145 mg total) by mouth daily., Disp: 90 tablet, Rfl: 1   gabapentin (NEURONTIN) 300 MG capsule, Take 1 capsule (300 mg total) by mouth 3 (three) times  daily., Disp: 270 capsule, Rfl: 1   glipiZIDE (GLUCOTROL) 10 MG tablet, Take 1 tablet (10 mg total) by mouth 2 (two) times daily., Disp: 180 tablet, Rfl: 1   glucose blood (TRUE METRIX BLOOD GLUCOSE TEST) test strip, Use as instructed, Disp: 100 each, Rfl: 12   glucose blood test strip, use as directed, Disp: 100 each, Rfl: 0   insulin aspart (NOVOLOG FLEXPEN) 100 UNIT/ML FlexPen, Inject 14 Units into the skin 3 (three) times daily with meals., Disp: 9 mL, Rfl: 3   insulin glargine (LANTUS SOLOSTAR) 100 UNIT/ML Solostar Pen, Inject 60 Units into the skin daily. (Patient taking differently: Inject 70 Units into the skin at bedtime.), Disp: 15 mL, Rfl: 3   insulin lispro (HUMALOG) 100 UNIT/ML injection, Inject 20 Units into the skin 3 (three) times daily before meals. Sliding scale, Disp: , Rfl:    Insulin Pen Needle (TRUEPLUS 5-BEVEL PEN NEEDLES) 32G X 4 MM MISC, USE AS DIRECTED, Disp: 100 each, Rfl: 3   Lancets MISC, use as directed, Disp: 100 each, Rfl: 0   losartan (COZAAR) 25 MG tablet, TAKE 1 TABLET (25 MG TOTAL) BY MOUTH DAILY., Disp: 90 tablet, Rfl: 1   metFORMIN (GLUCOPHAGE) 500 MG tablet, Take 2 tablets (1,000 mg total) by mouth 2 (two) times daily with a meal., Disp: 120 tablet, Rfl: 3   metoprolol succinate (TOPROL XL) 25 MG 24 hr tablet, Take 1 tablet (25 mg total) by mouth daily., Disp: 90 tablet, Rfl: 0   nitroGLYCERIN (NITROSTAT) 0.4 MG SL tablet, Place 1 tablet (0.4 mg total) under the tongue every 5 (five) minutes as needed for chest pain. If you require more than two tablets five minutes apart go to the nearest ER via EMS., Disp: 30 tablet, Rfl: 0   tiZANidine (ZANAFLEX) 4 MG tablet, Take 1 tablet (4 mg total) by mouth every 8 (eight) hours as needed for muscle spasms., Disp: 60 tablet, Rfl: 1   clopidogrel (PLAVIX) 75 MG tablet, Take 1 tablet (75 mg total) by mouth daily with breakfast., Disp: 90 tablet, Rfl: 1   empagliflozin (JARDIANCE) 25 MG TABS tablet, Take 1 tablet (25 mg  total) by mouth daily before breakfast. (Patient not taking: Reported on 08/08/2022), Disp: 30 tablet, Rfl: 3   linaclotide (LINZESS) 145 MCG CAPS capsule, Take 1 capsule (145 mcg total) by mouth daily before breakfast., Disp: 90 capsule, Rfl: 1   pantoprazole (PROTONIX) 40 MG tablet, Take 1 tablet (40 mg total) by mouth daily before breakfast. (Stop taking omeprazole)., Disp: 90 tablet, Rfl: 1   Semaglutide,0.25 or 0.5MG/DOS, 2 MG/3ML SOPN, Inject 0.25 mg into the skin once a week. X4 weeks. Then, increase to 0.5 mg weekly thereafter., Disp: 3 mL, Rfl: 2  Orders Placed This Encounter  Procedures   EKG 12-Lead     There are no Patient Instructions on file for this visit.   --Continue cardiac medications as reconciled in final medication list. --  No follow-ups on file. or sooner if needed. --Continue follow-up with your primary care physician regarding the management of your other chronic comorbid conditions.  Patient's questions and concerns were addressed to her satisfaction. She voices understanding of the instructions provided during this encounter.   This note was created using a voice recognition software as a result there may be grammatical errors inadvertently enclosed that do not reflect the nature of this encounter. Every attempt is made to correct such errors.  Rex Kras, [place], [facility]  Pager: [phone] Office: [phone]

## 2022-08-19 ENCOUNTER — Telehealth: Payer: Self-pay | Admitting: Cardiology

## 2022-08-19 ENCOUNTER — Telehealth: Payer: Self-pay

## 2022-08-19 NOTE — Telephone Encounter (Signed)
Spoke with patient in reference to another message, Patient asked if she is to continue Pantoprazole as well. I spoke with Dr. Terri Skains, he said to STOP the Pantoprazole and START Pepcid instead. I called patient back, she did not answer. No message left.

## 2022-08-19 NOTE — Telephone Encounter (Signed)
Patient aware.

## 2022-08-19 NOTE — Telephone Encounter (Signed)
Please call and inform that she needs to take ASA and Clopidogrel for 6 months to keep her stents open.   Dr. Terri Skains

## 2022-08-19 NOTE — Telephone Encounter (Signed)
Patient called stating she is taking the following medication - Clopidogrel 9m, Pantoprazole 418m Aspirin 8126m

## 2022-08-22 ENCOUNTER — Ambulatory Visit: Payer: Medicaid Other | Attending: Family Medicine

## 2022-08-22 DIAGNOSIS — R7989 Other specified abnormal findings of blood chemistry: Secondary | ICD-10-CM

## 2022-08-23 ENCOUNTER — Ambulatory Visit: Payer: Medicaid Other | Admitting: Cardiology

## 2022-08-23 LAB — CMP14+EGFR
ALT: 104 IU/L — ABNORMAL HIGH (ref 0–32)
AST: 107 IU/L — ABNORMAL HIGH (ref 0–40)
Albumin/Globulin Ratio: 1.1 — ABNORMAL LOW (ref 1.2–2.2)
Albumin: 3.9 g/dL (ref 3.9–4.9)
Alkaline Phosphatase: 80 IU/L (ref 44–121)
BUN/Creatinine Ratio: 15 (ref 9–23)
BUN: 15 mg/dL (ref 6–24)
Bilirubin Total: 0.3 mg/dL (ref 0.0–1.2)
CO2: 21 mmol/L (ref 20–29)
Calcium: 9.2 mg/dL (ref 8.7–10.2)
Chloride: 100 mmol/L (ref 96–106)
Creatinine, Ser: 1 mg/dL (ref 0.57–1.00)
Globulin, Total: 3.7 g/dL (ref 1.5–4.5)
Glucose: 205 mg/dL — ABNORMAL HIGH (ref 70–99)
Potassium: 4.2 mmol/L (ref 3.5–5.2)
Sodium: 135 mmol/L (ref 134–144)
Total Protein: 7.6 g/dL (ref 6.0–8.5)
eGFR: 70 mL/min/{1.73_m2} (ref >59)

## 2022-08-29 ENCOUNTER — Telehealth: Payer: Self-pay | Admitting: Emergency Medicine

## 2022-08-29 ENCOUNTER — Other Ambulatory Visit: Payer: Self-pay

## 2022-08-29 LAB — HM DIABETES EYE EXAM

## 2022-08-29 MED ORDER — LATANOPROST 0.005 % OP SOLN
Freq: Every evening | OPHTHALMIC | 11 refills | Status: AC
  Filled 2022-08-29: qty 5, 50d supply, fill #0
  Filled 2022-10-12: qty 5, 50d supply, fill #1
  Filled 2022-11-25 (×2): qty 5, 50d supply, fill #2
  Filled 2023-01-20: qty 5, 50d supply, fill #3
  Filled 2023-03-08 – 2023-03-14 (×2): qty 5, 50d supply, fill #4
  Filled 2023-04-27: qty 5, 25d supply, fill #5
  Filled 2023-05-12: qty 5, 25d supply, fill #6
  Filled 2023-05-24: qty 5, 50d supply, fill #6
  Filled 2023-05-24: qty 5, 25d supply, fill #6
  Filled 2023-07-10 – 2023-07-17 (×4): qty 5, 50d supply, fill #7

## 2022-08-29 NOTE — Telephone Encounter (Signed)
Copied from [place] [id number]. Topic: General - Other >> Aug 29, 2022  1:08 PM Ja-Kwan M wrote: Reason for CRM: Pt reports that she contacted the pharmacy but she was told they have not received a Rx for eye medication. Pt stated she was told it would be sent to her pharmacy and requests call back

## 2022-08-29 NOTE — Telephone Encounter (Signed)
I never prescribed that for her. Prescribing Clinician is Dr Manuella Ghazi and she needs to contact him. Thanks

## 2022-08-30 ENCOUNTER — Other Ambulatory Visit: Payer: Self-pay

## 2022-08-31 ENCOUNTER — Other Ambulatory Visit: Payer: Self-pay

## 2022-08-31 ENCOUNTER — Other Ambulatory Visit: Payer: Self-pay | Admitting: Pharmacist

## 2022-08-31 MED ORDER — BASAGLAR KWIKPEN 100 UNIT/ML ~~LOC~~ SOPN
60.0000 [IU] | PEN_INJECTOR | Freq: Every day | SUBCUTANEOUS | 3 refills | Status: DC
  Filled 2022-08-31 – 2022-09-23 (×2): qty 18, 30d supply, fill #0
  Filled 2022-10-25: qty 18, 30d supply, fill #1
  Filled 2022-11-25 (×2): qty 18, 30d supply, fill #2
  Filled 2022-12-19: qty 18, 30d supply, fill #3

## 2022-09-01 ENCOUNTER — Other Ambulatory Visit: Payer: Self-pay

## 2022-09-05 ENCOUNTER — Other Ambulatory Visit: Payer: Self-pay

## 2022-09-12 ENCOUNTER — Telehealth: Payer: Self-pay

## 2022-09-12 ENCOUNTER — Other Ambulatory Visit: Payer: Self-pay

## 2022-09-12 MED ORDER — PANTOPRAZOLE SODIUM 40 MG PO TBEC
40.0000 mg | DELAYED_RELEASE_TABLET | Freq: Every day | ORAL | 1 refills | Status: DC
  Filled 2022-09-12: qty 30, 30d supply, fill #0
  Filled 2022-10-12: qty 30, 30d supply, fill #1
  Filled 2022-11-07: qty 30, 30d supply, fill #2
  Filled 2022-12-05: qty 30, 30d supply, fill #3

## 2022-09-12 MED ORDER — CLOPIDOGREL BISULFATE 75 MG PO TABS
75.0000 mg | ORAL_TABLET | Freq: Every day | ORAL | 1 refills | Status: DC
  Filled 2022-09-12 – 2022-11-07 (×3): qty 90, 90d supply, fill #0
  Filled 2023-01-12 – 2023-01-30 (×3): qty 90, 90d supply, fill #1

## 2022-09-12 MED ORDER — ASPIRIN 81 MG PO CHEW
81.0000 mg | CHEWABLE_TABLET | Freq: Every day | ORAL | 1 refills | Status: AC
  Filled 2022-09-12: qty 90, 90d supply, fill #0

## 2022-09-12 MED ORDER — EMPAGLIFLOZIN 25 MG PO TABS
25.0000 mg | ORAL_TABLET | Freq: Every day | ORAL | 1 refills | Status: DC
  Filled 2022-09-12: qty 90, 90d supply, fill #0
  Filled 2022-10-12: qty 30, 30d supply, fill #0

## 2022-09-12 NOTE — Telephone Encounter (Signed)
Yes continue aspirin, Plavix, statin therapy.  Ezinne Yogi [place], DO, [facility]

## 2022-09-12 NOTE — Telephone Encounter (Signed)
Patient called and is asking is she should continue the Atorvastatin? Please advise.

## 2022-09-13 ENCOUNTER — Other Ambulatory Visit: Payer: Self-pay

## 2022-09-13 NOTE — Telephone Encounter (Signed)
Patient is now asking about her sleep study referral. She said that no one has called her to set up an appointment.

## 2022-09-16 NOTE — Telephone Encounter (Signed)
Spoke with patient, she already received the call from sleep study office.

## 2022-09-23 ENCOUNTER — Other Ambulatory Visit: Payer: Self-pay | Admitting: Family Medicine

## 2022-09-23 ENCOUNTER — Encounter: Payer: Self-pay | Admitting: Pharmacist

## 2022-09-23 ENCOUNTER — Other Ambulatory Visit: Payer: Self-pay

## 2022-09-23 ENCOUNTER — Ambulatory Visit: Payer: Medicaid Other | Attending: Family Medicine | Admitting: Pharmacist

## 2022-09-23 DIAGNOSIS — M25512 Pain in left shoulder: Secondary | ICD-10-CM

## 2022-09-23 DIAGNOSIS — E1169 Type 2 diabetes mellitus with other specified complication: Secondary | ICD-10-CM | POA: Diagnosis not present

## 2022-09-23 DIAGNOSIS — E1165 Type 2 diabetes mellitus with hyperglycemia: Secondary | ICD-10-CM

## 2022-09-23 DIAGNOSIS — Z794 Long term (current) use of insulin: Secondary | ICD-10-CM | POA: Diagnosis not present

## 2022-09-23 MED ORDER — NOVOLOG FLEXPEN 100 UNIT/ML ~~LOC~~ SOPN
20.0000 [IU] | PEN_INJECTOR | Freq: Three times a day (TID) | SUBCUTANEOUS | 3 refills | Status: DC
  Filled 2022-09-23: qty 9, 15d supply, fill #0
  Filled 2022-10-12: qty 9, 15d supply, fill #1
  Filled 2022-10-25: qty 9, 15d supply, fill #2
  Filled 2022-11-08: qty 9, 15d supply, fill #3

## 2022-09-23 MED ORDER — TIZANIDINE HCL 4 MG PO TABS
4.0000 mg | ORAL_TABLET | Freq: Three times a day (TID) | ORAL | 1 refills | Status: DC | PRN
  Filled 2022-09-23: qty 60, 20d supply, fill #0
  Filled 2022-10-12: qty 60, 20d supply, fill #1

## 2022-09-23 MED ORDER — ACCU-CHEK GUIDE VI STRP
ORAL_STRIP | 6 refills | Status: DC
  Filled 2022-09-23: qty 100, 25d supply, fill #0
  Filled 2022-10-25: qty 100, 25d supply, fill #1
  Filled 2022-11-14: qty 100, 25d supply, fill #2
  Filled 2022-12-14 – 2022-12-21 (×2): qty 100, 25d supply, fill #3
  Filled 2023-01-09: qty 100, 25d supply, fill #4
  Filled 2023-02-02: qty 100, 25d supply, fill #5
  Filled 2023-02-27: qty 100, 25d supply, fill #6

## 2022-09-23 MED ORDER — SEMAGLUTIDE (1 MG/DOSE) 4 MG/3ML ~~LOC~~ SOPN
1.0000 mg | PEN_INJECTOR | SUBCUTANEOUS | 2 refills | Status: DC
  Filled 2022-09-23: qty 3, 28d supply, fill #0
  Filled 2022-10-25: qty 3, 28d supply, fill #1

## 2022-09-23 MED ORDER — LORATADINE 10 MG PO TABS
10.0000 mg | ORAL_TABLET | Freq: Every day | ORAL | 11 refills | Status: DC
  Filled 2022-09-23: qty 30, 30d supply, fill #0
  Filled 2022-10-03: qty 30, 30d supply, fill #1
  Filled 2022-11-02: qty 30, 30d supply, fill #2
  Filled 2022-11-25 (×2): qty 30, 30d supply, fill #3
  Filled 2022-12-28: qty 30, 30d supply, fill #4
  Filled 2023-01-24: qty 30, 30d supply, fill #5
  Filled 2023-04-13: qty 30, 30d supply, fill #6
  Filled 2023-05-24 (×2): qty 30, 30d supply, fill #7

## 2022-09-23 NOTE — Progress Notes (Signed)
    S:     No chief complaint on file.  47 y.o. female who presents for diabetes evaluation, education, and management.  PMH is significant for left occipital lobe stroke in 12/2021, PFO, type 2 diabetes mellitus (A1c 10.4), dyslipidemia, GERD, nicotine dependence. Patient was referred and last seen by Primary Care Provider, Dr. Margarita Rana, on 07/13/2022. Trulicity was added at that visit.  I saw her last month and changed Trulicity to Ozempic per patient preference. Today, she reports doing well but needs a refill for Basaglar. Has been without for ~2 days. Overall she is doing well. Is now taking 0.5 mg weekly of the Ozempic. Denies NV, abdominal pain. No changes in vision.    Family/Social History:  Fhx: DM, heart disease, HTN Tobacco: former smoker  Alcohol: none reported  Current diabetes medications include: Basaglar 60u daily, Novolog 14u TID (will sometimes tak 20u TID. Sometimes she injects BID when she only eats two meals), Ozempic 0.5 mg weekly, Jardiance 25 mg daily, metformin 1000 mg BID (takes two 500 mg tablets BID).   Insurance coverage: [place] [employer]  Patient denies hypoglycemic events.  Reported home fasting blood sugars: 120s - 190s  Reported 2 hour post-meal/random blood sugars: none No CGM in place or GM with her today.  Patient denies nocturia (nighttime urination).  Patient reports neuropathy (nerve pain). Patient reports visual changes. Patient reports self foot exams.   Patient reported dietary habits:  -Eats 2-3 meals daily  -Since last visit, she tells she has cut out her carbs and has started drinking "green juice".   Patient-reported exercise habits:  -None  O:   ROS  Physical Exam  7 day average blood glucose: no meter with her today.  No CGM in place.  Lab Results  Component Value Date   HGBA1C 11.1 (H) 08/10/2022   There were no vitals filed for this visit.  Lipid Panel     Component Value Date/Time   CHOL 126 07/21/2022 1010   TRIG 218  (H) 07/21/2022 1010   HDL 26 (L) 07/21/2022 1010   CHOLHDL 6.0 12/21/2021 0925   VLDL 45 (H) 12/21/2021 0925   LDLCALC 64 07/21/2022 1010   LDLDIRECT 64 07/21/2022 1010    Clinical Atherosclerotic Cardiovascular Disease (ASCVD): Yes  The ASCVD Risk score (Arnett DK, et al., 2019) failed to calculate for the following reasons:   The patient has a prior MI or stroke diagnosis   A/P: Diabetes longstanding currently uncontrolled but reported home sugars are better. Patient is able to verbalize appropriate hypoglycemia management plan. Medication adherence appears optimal. I have advised that she take insulin as prescribed. We continue to titrate Ozempic for better blood sugar control.  -Increase Ozempic to 1 mg weekly.  -Continued basal/bolus insulin regimen at current doses.  -Continued metformin 1000 mg BID.  -Continue Jardiance 25 mg daily. -Patient educated on purpose, proper use, and potential adverse effects of Ozempic.  -Extensively discussed pathophysiology of diabetes, recommended lifestyle interventions, dietary effects on blood sugar control.  -Counseled on s/sx of and management of hypoglycemia.  -Next A1c anticipated 11/2022.   Written patient instructions provided. Patient verbalized understanding of treatment plan.  Total time in face to face counseling 30 minutes.    Follow-up:  PCP clinic visit 10/12/2022.   Benard Halsted, PharmD, [facility], [place] [phone]

## 2022-09-26 ENCOUNTER — Other Ambulatory Visit: Payer: Self-pay

## 2022-10-03 ENCOUNTER — Other Ambulatory Visit: Payer: Self-pay

## 2022-10-04 ENCOUNTER — Other Ambulatory Visit: Payer: Self-pay

## 2022-10-12 ENCOUNTER — Other Ambulatory Visit: Payer: Self-pay

## 2022-10-12 ENCOUNTER — Encounter: Payer: Self-pay | Admitting: Family Medicine

## 2022-10-12 ENCOUNTER — Ambulatory Visit: Payer: Medicaid Other | Attending: Family Medicine | Admitting: Family Medicine

## 2022-10-12 DIAGNOSIS — E1169 Type 2 diabetes mellitus with other specified complication: Secondary | ICD-10-CM

## 2022-10-12 DIAGNOSIS — Z794 Long term (current) use of insulin: Secondary | ICD-10-CM | POA: Diagnosis not present

## 2022-10-12 DIAGNOSIS — I10 Essential (primary) hypertension: Secondary | ICD-10-CM | POA: Diagnosis not present

## 2022-10-12 MED ORDER — GLIPIZIDE 10 MG PO TABS
10.0000 mg | ORAL_TABLET | Freq: Two times a day (BID) | ORAL | 1 refills | Status: DC
  Filled 2022-10-12 – 2022-11-07 (×3): qty 180, 90d supply, fill #0
  Filled 2023-01-12 – 2023-01-30 (×3): qty 180, 90d supply, fill #1

## 2022-10-12 MED ORDER — SEMAGLUTIDE (2 MG/DOSE) 8 MG/3ML ~~LOC~~ SOPN
2.0000 mg | PEN_INJECTOR | SUBCUTANEOUS | 6 refills | Status: DC
  Filled 2022-10-12: qty 3, fill #0
  Filled 2022-11-14: qty 3, 28d supply, fill #0
  Filled 2022-12-05: qty 3, 28d supply, fill #1
  Filled 2022-12-28: qty 3, 28d supply, fill #2
  Filled 2023-01-12 – 2023-01-24 (×2): qty 3, 28d supply, fill #3
  Filled 2023-02-14: qty 3, 28d supply, fill #4
  Filled 2023-03-21: qty 3, 28d supply, fill #5
  Filled 2023-04-13: qty 3, 28d supply, fill #6

## 2022-10-12 NOTE — Patient Instructions (Signed)
Diabetes mellitus y nutricin, en adultos Diabetes Mellitus and Nutrition, Adult Si sufre de diabetes, o diabetes mellitus, es muy importante tener hbitos alimenticios saludables debido a que sus niveles de azcar en la sangre (glucosa) se ven afectados en gran medida por lo que come y bebe. Comer alimentos saludables en las cantidades correctas, aproximadamente a la misma hora todos los das, lo ayudar a: Controlar su glucemia. Disminuir el riesgo de sufrir una enfermedad cardaca. Mejorar la presin arterial. Alcanzar o mantener un peso saludable. Qu puede afectar mi plan de alimentacin? Todas las personas que sufren de diabetes son diferentes y cada una tiene necesidades diferentes en cuanto a un plan de alimentacin. El mdico puede recomendarle que trabaje con un nutricionista para elaborar el mejor plan para usted. Su plan de alimentacin puede variar segn factores como: Las caloras que necesita. Los medicamentos que toma. Su peso. Sus niveles de glucemia, presin arterial y colesterol. Su nivel de actividad. Otras afecciones que tenga, como enfermedades cardacas o renales. Cmo me afectan los carbohidratos? Los carbohidratos, o hidratos de carbono, afectan su nivel de glucemia ms que cualquier otro tipo de alimento. La ingesta de carbohidratos aumenta la cantidad de glucosa en la sangre. Es importante conocer la cantidad de carbohidratos que se pueden ingerir en cada comida sin correr ningn riesgo. Esto es diferente en cada persona. Su nutricionista puede ayudarlo a calcular la cantidad de carbohidratos que debe ingerir en cada comida y en cada refrigerio. Cmo me afecta el alcohol? El alcohol puede provocar una disminucin de la glucemia (hipoglucemia), especialmente si usa insulina o toma determinados medicamentos por va oral para la diabetes. La hipoglucemia es una afeccin potencialmente mortal. Los sntomas de la hipoglucemia, como somnolencia, mareos y confusin, son  similares a los sntomas de haber consumido demasiado alcohol. No beba alcohol si: Su mdico le indica no hacerlo. Est embarazada, puede estar embarazada o est tratando de quedar embarazada. Si bebe alcohol: Limite la cantidad que bebe a lo siguiente: De 0 a 1 medida por da para las mujeres. De 0 a 2 medidas por da para los hombres. Sepa cunta cantidad de alcohol hay en las bebidas que toma. En los [place], una medida equivale a una botella de cerveza de 12 oz (355 ml), un vaso de vino de 5 oz (148 ml) o un vaso de una bebida alcohlica de alta graduacin de 1 oz (44 ml). Mantngase hidratado bebiendo agua, refrescos dietticos o t helado sin azcar. Tenga en cuenta que los refrescos comunes, los jugos y otras bebidas para mezclar pueden contener mucha azcar y se deben contar como carbohidratos. Consejos para seguir este plan  Leer las etiquetas de los alimentos Comience por leer el tamao de la porcin en la etiqueta de Informacin nutricional de los alimentos envasados y las bebidas. La cantidad de caloras, carbohidratos, grasas y otros nutrientes detallados en la etiqueta se basan en una porcin del alimento. Muchos alimentos contienen ms de una porcin por envase. Verifique la cantidad total de gramos (g) de carbohidratos totales en una porcin. Verifique la cantidad de gramos de grasas saturadas y grasas trans en una porcin. Escoja alimentos que no contengan estas grasas o que su contenido de estas sea bajo. Verifique la cantidad de miligramos (mg) de sal (sodio) en una porcin. La mayora de las personas deben limitar la ingesta de sodio total a menos de 2300 mg por da. Siempre consulte la informacin nutricional de los alimentos etiquetados como "con bajo contenido de grasa" o "sin grasa".   Estos alimentos pueden tener un mayor contenido de azcar agregada o carbohidratos refinados, y deben evitarse. Hable con su nutricionista para identificar sus objetivos diarios en  cuanto a los nutrientes mencionados en la etiqueta. Al ir de compras Evite comprar alimentos procesados, enlatados o precocidos. Estos alimentos tienden a tener una mayor cantidad de grasa, sodio y azcar agregada. Compre en la zona exterior de la tienda de comestibles. Esta es la zona donde se encuentran con mayor frecuencia las frutas y las verduras frescas, los cereales a granel, las carnes frescas y los productos lcteos frescos. Al cocinar Use mtodos de coccin a baja temperatura, como hornear, en lugar de mtodos de coccin a alta temperatura, como frer en abundante aceite. Cocine con aceites saludables, como el aceite de oliva, canola o girasol. Evite cocinar con manteca, crema o carnes con alto contenido de grasa. Planificacin de las comidas Coma las comidas y los refrigerios regularmente, preferentemente a la misma hora todos los das. Evite pasar largos perodos de tiempo sin comer. Consuma alimentos ricos en fibra, como frutas frescas, verduras, frijoles y cereales integrales. Consuma entre 4 y 6 onzas (entre 112 y 168 g) de protenas magras por da, como carnes magras, pollo, pescado, huevos o tofu. Una onza (oz) (28 g) de protena magra equivale a: 1 onza (28 g) de carne, pollo o pescado. 1 huevo.  taza (62 g) de tofu. Coma algunos alimentos por da que contengan grasas saludables, como aguacates, frutos secos, semillas y pescado. Qu alimentos debo comer? Frutas Bayas. Manzanas. Naranjas. Duraznos. Damascos. Ciruelas. Uvas. Mangos. Papayas. Granadas. Kiwi. Cerezas. Verduras Verduras de hoja verde, que incluyen lechuga, espinaca, col rizada, acelga, hojas de berza, hojas de mostaza y repollo. Remolachas. Coliflor. Brcoli. Zanahorias. Judas verdes. Tomates. Pimientos. Cebollas. Pepinos. Coles de Bruselas. Granos Granos integrales, como panes, galletas, tortillas, cereales y pastas de salvado o integrales. Avena sin azcar. Quinua. Arroz integral o salvaje. Carnes y otras  protenas Frutos de mar. Carne de ave sin piel. Cortes magros de ave y carne de res. Tofu. Frutos secos. Semillas. Lcteos Productos lcteos sin grasa o con bajo contenido de grasa, como leche, yogur y queso. Es posible que los productos detallados arriba no constituyan una lista completa de los alimentos y las bebidas que puede tomar. Consulte a un nutricionista para obtener ms informacin. Qu alimentos debo evitar? Frutas Frutas enlatadas al almbar. Verduras Verduras enlatadas. Verduras congeladas con mantequilla o salsa de crema. Granos Productos elaborados con harina y harina blanca refinada, como panes, pastas, bocadillos y cereales. Evite todos los alimentos procesados. Carnes y otras protenas Cortes de carne con alto contenido de grasa. Carne de ave con piel. Carnes empanizadas o fritas. Carne procesada. Evite las grasas saturadas. Lcteos Yogur, queso o leche enteros. Bebidas Bebidas azucaradas, como gaseosas o t helado. Es posible que los productos que se enumeran ms arriba no constituyan una lista completa de los alimentos y las bebidas que debe evitar. Consulte a un nutricionista para obtener ms informacin. Preguntas para hacerle al mdico Debo consultar con un especialista certificado en atencin y educacin sobre la diabetes? Es necesario que me rena con un nutricionista? A qu nmero puedo llamar si tengo preguntas? Cules son los mejores momentos para controlar la glucemia? Dnde encontrar ms informacin: American Diabetes Association (Asociacin Estadounidense de la Diabetes): diabetes.org Academy of Nutrition and Dietetics (Academia de Nutricin y Diettica): eatright.org National Institute of Diabetes and Digestive and Kidney Diseases (Instituto Nacional de la Diabetes y las Enfermedades Digestivas y Renales): niddk.nih.gov Association of Diabetes   Care & Education Specialists (Asociacin de Especialistas en Atencin y Educacin sobre la Diabetes):  diabeteseducator.org Resumen Es importante tener hbitos alimenticios saludables debido a que sus niveles de azcar en la sangre (glucosa) se ven afectados en gran medida por lo que come y bebe. Es importante consumir alcohol con prudencia. Un plan de comidas saludable lo ayudar a controlar la glucosa en sangre y a reducir el riesgo de enfermedades cardacas. El mdico puede recomendarle que trabaje con un nutricionista para elaborar el mejor plan para usted. Esta informacin no tiene como fin reemplazar el consejo del mdico. Asegrese de hacerle al mdico cualquier pregunta que tenga. Document Revised: 02/26/2020 Document Reviewed: 02/26/2020 Elsevier Patient Education  2023 Elsevier Inc.  

## 2022-10-12 NOTE — Progress Notes (Unsigned)
Subjective:  Patient ID: Julie Robertson, female    DOB: 07/10/1975  Age: 47 y.o. MRN: [id number]  CC: Diabetes   HPI Julie Robertson is a 47 y.o. year old female with a history of left occipital lobe stroke in 12/2021, PFO, type 2 diabetes mellitus (A1c 11.1), dyslipidemia, GERD, nicotine dependence.  Interval History:  She is doing well on Ozempic with maximum random sugars of 201 and fasting sugars from 120-170. On further questioning she informs me she has been on 0.25mg  and should be staring her next dose next week. Review of her chart indicates she has a prescription for 1 mg of Ozempic and I have verified this with the pharmacy. She has no additional concerns today. Past Medical History:  Diagnosis Date   Anxiety    Asthma    CVA (cerebral vascular accident)    Diabetes mellitus without complication    Diabetic neuropathy 12/2013   vs carpal tunnell.  numbness tingling in right fingers. rx with Gabapentin.   Dyslipidemia 08/2009   Dyspnea    Gall stones 08/2009   GERD (gastroesophageal reflux disease)    H/O tubal ligation    Obesity    BMI 41, 250# 03/2015    Past Surgical History:  Procedure Laterality Date   APPLICATION OF WOUND VAC N/A 05/09/2019   Procedure: APPLICATION OF WOUND VAC;  Surgeon: Henrene Dodge, MD;  Location: [facility] ORS;  Service: General;  Laterality: N/A;  [id number]   BUBBLE STUDY  12/24/2021   Procedure: BUBBLE STUDY;  Surgeon: Lewayne Bunting, MD;  Location: [facility] ENDOSCOPY;  Service: Cardiovascular;;   CHOLECYSTECTOMY N/A 03/10/2015   Procedure: LAPAROSCOPIC CHOLECYSTECTOMY WITH INTRAOPERATIVE CHOLANGIOGRAM;  Surgeon: Violeta Gelinas, MD;  Location: [facility] OR;  Service: General;  Laterality: N/A;   CORONARY BALLOON ANGIOPLASTY N/A 08/09/2022   Procedure: CORONARY BALLOON ANGIOPLASTY;  Surgeon: Yates Decamp, MD;  Location: [facility] INVASIVE CV LAB;  Service: Cardiovascular;  Laterality: N/A;   ERCP N/A 03/11/2015   Procedure: ENDOSCOPIC  RETROGRADE CHOLANGIOPANCREATOGRAPHY (ERCP);  Surgeon: Rachael Fee, MD;  Location[facility]COPY;  Service: Endoscopy;  Laterality: N/A;   ESOPHAGOGASTRODUODENOSCOPY (EGD) WITH PROPOFOL N/A 07/25/2018   Procedure: ESOPHAGOGASTRODUODENOSCOPY (EGD) WITH PROPOFOL;  Surgeon: Meridee Score Netty Starring., MD;  Location: [facility] ENDOSCOPY;  Service: Gastroenterology;  Laterality: N/A;   INSERTION OF MESH N/A 05/09/2019   Procedure: INSERTION OF MESH;  Surgeon: Henrene Dodge, MD;  Location: [facility] ORS;  Service: General;  Laterality: N/A;   LEFT HEART CATH AND CORONARY ANGIOGRAPHY N/A 08/09/2022   Procedure: LEFT HEART CATH AND CORONARY ANGIOGRAPHY;  Surgeon: Yates Decamp, MD;  Location: [facility] INVASIVE CV LAB;  Service: Cardiovascular;  Laterality: N/A;   TEE WITHOUT CARDIOVERSION N/A 12/24/2021   Procedure: TRANSESOPHAGEAL ECHOCARDIOGRAM (TEE);  Surgeon: Lewayne Bunting, MD;  Location: [facility] ENDOSCOPY;  Service: Cardiovascular;  Laterality: N/A;   Transduodenal Ampullectomy  09/2015   TUBAL LIGATION     VENTRAL HERNIA REPAIR N/A 05/09/2019   Procedure: HERNIA REPAIR VENTRAL ADULT with MESH;  Surgeon: Henrene Dodge, MD;  Location: [facility] ORS;  Service: General;  Laterality: N/A;    Family History  Adopted: Yes  Problem Relation Age of Onset   Diabetes Mother    Heart disease Mother    Hypertension Mother    Heart attack Father    Diabetes Father    Kidney disease Maternal Uncle    Bone cancer Maternal Grandfather    Other Son        had kidney removed due to  gun shot wound    Social History   Socioeconomic History   Marital status: Married    Spouse name: Not on file   Number of children: 2   Years of education: Not on file   Highest education level: Not on file  Occupational History   Occupation: house cleaning  Tobacco Use   Smoking status: Former    Packs/day: 1.50    Years: 4.00    Additional pack years: 0.00    Total pack years: 6.00    Types: Cigarettes   Smokeless tobacco: Never  Vaping Use    Vaping Use: Never used  Substance and Sexual Activity   Alcohol use: Yes    Comment: rare   Drug use: No   Sexual activity: Not on file  Other Topics Concern   Not on file  Social History Narrative   Patient has 2 sons one is 9, the other 78 as of 03/2015. Her kids are not the children of her current husband. As of 03/2015 she is not employed outside the home.   Social Determinants of Health   Financial Resource Strain: Low Risk  (09/23/2022)   Overall Financial Resource Strain (CARDIA)    Difficulty of Paying Living Expenses: Not very hard  Food Insecurity: No Food Insecurity (09/23/2022)   Hunger Vital Sign    Worried About Running Out of Food in the Last Year: Never true    Ran Out of Food in the Last Year: Never true  Transportation Needs: No Transportation Needs (09/23/2022)   PRAPARE - Administrator, Civil Service (Medical): No    Lack of Transportation (Non-Medical): No  Physical Activity: Inactive (09/23/2022)   Exercise Vital Sign    Days of Exercise per Week: 0 days    Minutes of Exercise per Session: 0 min  Stress: No Stress Concern Present (09/23/2022)   [employer] of Occupational Health - Occupational Stress Questionnaire    Feeling of Stress : Only a little  Social Connections: Moderately Isolated (09/23/2022)   Social Connection and Isolation Panel [NHANES]    Frequency of Communication with Friends and Family: More than three times a week    Frequency of Social Gatherings with Friends and Family: More than three times a week    Attends Religious Services: Never    Database administrator or Organizations: No    Attends Engineer, structural: Never    Marital Status: Married    No Known Allergies  Outpatient Medications Prior to Visit  Medication Sig Dispense Refill   albuterol (VENTOLIN HFA) 108 (90 Base) MCG/ACT inhaler Inhale 2 puffs into the lungs every 6 (six) hours as needed for wheezing or shortness of breath. 18 g 1   aspirin 81  MG chewable tablet Chew 1 tablet (81 mg total) by mouth daily. 90 tablet 1   atorvastatin (LIPITOR) 40 MG tablet Take 1 tablet (40 mg total) by mouth at bedtime. 90 tablet 3   blood glucose meter kit and supplies KIT Use up to four times daily as directed. 1 each 11   Blood Glucose Monitoring Suppl (TRUE METRIX METER) w/Device KIT Use as directed 1 kit 0   Blood Glucose Monitoring Suppl (TRUE METRIX METER) w/Device KIT use up to 4 times daily as directed 1 kit 0   clopidogrel (PLAVIX) 75 MG tablet Take 1 tablet (75 mg total) by mouth daily with breakfast. 90 tablet 1   empagliflozin (JARDIANCE) 25 MG TABS tablet Take 1 tablet (25  mg total) by mouth daily before breakfast. 90 tablet 1   fenofibrate (TRICOR) 145 MG tablet Take 1 tablet (145 mg total) by mouth daily. 90 tablet 1   gabapentin (NEURONTIN) 300 MG capsule Take 1 capsule (300 mg total) by mouth 3 (three) times daily. 270 capsule 1   glucose blood (ACCU-CHEK GUIDE) test strip use as directed 100 each 6   glucose blood (TRUE METRIX BLOOD GLUCOSE TEST) test strip Use as instructed 100 each 12   insulin aspart (NOVOLOG FLEXPEN) 100 UNIT/ML FlexPen Inject 20 Units into the skin 3 (three) times daily with meals. 9 mL 3   Insulin Glargine (BASAGLAR KWIKPEN) 100 UNIT/ML Inject 60 Units into the skin daily. 18 mL 3   Insulin Pen Needle (TRUEPLUS 5-BEVEL PEN NEEDLES) 32G X 4 MM MISC USE AS DIRECTED 100 each 3   Lancets MISC use as directed 100 each 0   latanoprost (XALATAN) 0.005 % ophthalmic solution Place 1 drop into both eyes every evening. 5 mL 11   linaclotide (LINZESS) 145 MCG CAPS capsule Take 1 capsule (145 mcg total) by mouth daily before breakfast. 90 capsule 1   loratadine (CLARITIN) 10 MG tablet Take 1 tablet (10 mg total) by mouth daily. 30 tablet 11   metFORMIN (GLUCOPHAGE) 500 MG tablet Take 2 tablets (1,000 mg total) by mouth 2 (two) times daily with a meal. 120 tablet 3   metoprolol succinate (TOPROL XL) 25 MG 24 hr tablet Take 1  tablet (25 mg total) by mouth daily. 90 tablet 1   pantoprazole (PROTONIX) 40 MG tablet Take 1 tablet (40 mg total) by mouth daily before breakfast. (Stop taking omeprazole). 90 tablet 1   Semaglutide, 1 MG/DOSE, 4 MG/3ML SOPN Inject 1 mg as directed once a week. 3 mL 2   tiZANidine (ZANAFLEX) 4 MG tablet Take 1 tablet (4 mg total) by mouth every 8 (eight) hours as needed for muscle spasms. 60 tablet 1   glipiZIDE (GLUCOTROL) 10 MG tablet Take 1 tablet (10 mg total) by mouth 2 (two) times daily. 180 tablet 1   losartan (COZAAR) 25 MG tablet TAKE 1 TABLET (25 MG TOTAL) BY MOUTH DAILY. 90 tablet 1   nitroGLYCERIN (NITROSTAT) 0.4 MG SL tablet Place 1 tablet (0.4 mg total) under the tongue every 5 (five) minutes as needed for chest pain. If you require more than two tablets five minutes apart go to the nearest ER via EMS. 30 tablet 0   No facility-administered medications prior to visit.     ROS Review of Systems  Constitutional:  Negative for activity change and appetite change.  HENT:  Negative for sinus pressure and sore throat.   Respiratory:  Negative for chest tightness, shortness of breath and wheezing.   Cardiovascular:  Negative for chest pain and palpitations.  Gastrointestinal:  Negative for abdominal distention, abdominal pain and constipation.  Genitourinary: Negative.   Musculoskeletal: Negative.   Psychiatric/Behavioral:  Negative for behavioral problems and dysphoric mood.     Objective:  BP (!) 90/59   Pulse 73   Ht 5\' 5"  (1.651 m)   Wt 256 lb (116.1 kg)   SpO2 96%   BMI 42.60 kg/m      10/12/2022    2:40 PM 08/18/2022    3:08 PM 08/18/2022    3:03 PM  BP/Weight  Systolic BP 90 116 96  Diastolic BP 59 68 46  Wt. (Lbs) 256  267.4  BMI 42.6 kg/m2  44.5 kg/m2      Physical  Exam Constitutional:      Appearance: She is well-developed.  Cardiovascular:     Rate and Rhythm: Normal rate.     Heart sounds: Normal heart sounds. No murmur heard. Pulmonary:      Effort: Pulmonary effort is normal.     Breath sounds: Normal breath sounds. No wheezing or rales.  Chest:     Chest wall: No tenderness.  Abdominal:     General: Bowel sounds are normal. There is no distension.     Palpations: Abdomen is soft. There is no mass.     Tenderness: There is no abdominal tenderness.  Musculoskeletal:        General: Normal range of motion.     Right lower leg: No edema.     Left lower leg: No edema.  Neurological:     Mental Status: She is alert and oriented to person, place, and time.  Psychiatric:        Mood and Affect: Mood normal.        Latest Ref Rng & Units 08/22/2022    2:10 PM 08/10/2022    5:49 AM 07/21/2022   10:10 AM  CMP  Glucose 70 - 99 mg/dL 497  026  378   BUN 6 - 24 mg/dL 15  11  16    Creatinine 0.57 - 1.00 mg/dL 5.88  5.02  7.74   Sodium 134 - 144 mmol/L 135  130  135   Potassium 3.5 - 5.2 mmol/L 4.2  3.7  4.3   Chloride 96 - 106 mmol/L 100  99  101   CO2 20 - 29 mmol/L 21  23  22    Calcium 8.7 - 10.2 mg/dL 9.2  8.7  9.5   Total Protein 6.0 - 8.5 g/dL 7.6   7.6   Total Bilirubin 0.0 - 1.2 mg/dL 0.3   0.3   Alkaline Phos 44 - 121 IU/L 80   88   AST 0 - 40 IU/L 107   92   ALT 0 - 32 IU/L 104   105     Lipid Panel     Component Value Date/Time   CHOL 126 07/21/2022 1010   TRIG 218 (H) 07/21/2022 1010   HDL 26 (L) 07/21/2022 1010   CHOLHDL 6.0 12/21/2021 0925   VLDL 45 (H) 12/21/2021 0925   LDLCALC 64 07/21/2022 1010   LDLDIRECT 64 07/21/2022 1010    CBC    Component Value Date/Time   WBC 5.4 08/10/2022 0549   RBC 4.50 08/10/2022 0549   HGB 9.6 (L) 08/10/2022 0549   HGB 10.5 (L) 07/21/2022 1010   HCT 32.0 (L) 08/10/2022 0549   HCT 37.3 07/21/2022 1010   PLT 350 08/10/2022 0549   PLT 404 07/21/2022 1010   MCV 71.1 (L) 08/10/2022 0549   MCV 74 (L) 07/21/2022 1010   MCH 21.3 (L) 08/10/2022 0549   MCHC 30.0 08/10/2022 0549   RDW 15.4 08/10/2022 0549   RDW 14.9 07/21/2022 1010   LYMPHSABS 3.5 12/20/2021 1616    LYMPHSABS 2.1 08/30/2021 1115   MONOABS 0.5 12/20/2021 1616   EOSABS 0.4 12/20/2021 1616   EOSABS 0.4 08/30/2021 1115   BASOSABS 0.0 12/20/2021 1616   BASOSABS 0.0 08/30/2021 1115    Lab Results  Component Value Date   HGBA1C 11.1 (H) 08/10/2022    Assessment & Plan:  1. Type 2 diabetes mellitus with other specified complication, with long-term current use of insulin Controlled with A1c of 11.1, goal is less than  7.0 She has had some improvement with regards to her home blood sugars Increased Ozempic dose to 2 mg Advised to bring in all her medications including Ozempic pen so we can verify what she is actually taking Counseled on Diabetic diet, my plate method, 161 minutes of moderate intensity exercise/week Blood sugar logs with fasting goals of 80-120 mg/dl, random of less than 096 and in the event of sugars less than 60 mg/dl or greater than 045 mg/dl encouraged to notify the clinic. Advised on the need for annual eye exams, annual foot exams, Pneumonia vaccine. - glipiZIDE (GLUCOTROL) 10 MG tablet; Take 1 tablet (10 mg total) by mouth 2 (two) times daily.  Dispense: 180 tablet; Refill: 1 - Semaglutide, 2 MG/DOSE, 8 MG/3ML SOPN; Inject 2 mg as directed once a week. Starting on 10/24/2022  Dispense: 3 mL; Refill: 6  2. Essential hypertension Soft blood pressure She is under cardiology care and has been on metoprolol I have discontinued losartan to prevent medication induced hypotension Counseled on blood pressure goal of less than 130/80, low-sodium, DASH diet, medication compliance, 150 minutes of moderate intensity exercise per week. Discussed medication compliance, adverse effects.     Meds ordered this encounter  Medications   glipiZIDE (GLUCOTROL) 10 MG tablet    Sig: Take 1 tablet (10 mg total) by mouth 2 (two) times daily.    Dispense:  180 tablet    Refill:  1   Semaglutide, 2 MG/DOSE, 8 MG/3ML SOPN    Sig: Inject 2 mg as directed once a week. Starting on 10/24/2022     Dispense:  3 mL    Refill:  6    Follow-up: Return in about 1 month (around 11/11/2022) for Diabetes follow-up.       Hoy Register, MD, FAAFP. [facility] [place], [place] [phone]   10/13/2022, 2:42 PM

## 2022-10-13 ENCOUNTER — Other Ambulatory Visit: Payer: Self-pay

## 2022-10-13 ENCOUNTER — Telehealth: Payer: Self-pay | Admitting: Pharmacist

## 2022-10-13 ENCOUNTER — Other Ambulatory Visit: Payer: Self-pay | Admitting: Pharmacist

## 2022-10-13 MED ORDER — EMPAGLIFLOZIN 25 MG PO TABS
25.0000 mg | ORAL_TABLET | Freq: Every day | ORAL | 1 refills | Status: DC
  Filled 2022-10-13: qty 90, 90d supply, fill #0
  Filled 2023-01-12: qty 90, 90d supply, fill #1

## 2022-10-13 NOTE — Telephone Encounter (Signed)
Can we initiate a PA on this patient's Jardiance?

## 2022-10-14 ENCOUNTER — Other Ambulatory Visit: Payer: Self-pay

## 2022-10-17 ENCOUNTER — Other Ambulatory Visit: Payer: Self-pay

## 2022-10-18 ENCOUNTER — Other Ambulatory Visit: Payer: Self-pay

## 2022-10-25 ENCOUNTER — Telehealth: Payer: Self-pay

## 2022-10-25 ENCOUNTER — Other Ambulatory Visit: Payer: Self-pay

## 2022-10-25 NOTE — Telephone Encounter (Signed)
Plavix should not be doing that.  Please make sure she does not stop plavix abruptly due to her recent coronary stents.  She can see Dr. Rozell Searing Custovic in the interim.Julie Lerner, DO, [facility]

## 2022-10-25 NOTE — Telephone Encounter (Signed)
Patient called with complaints of numbness in her arms and she can tell her veins look  "enlarged".  She is concerned and wants to know if one of her medications is causing this.   Spoke with ST, patient can come in to be seen.   I tried calling patient, to let her know. NA, LMAM.

## 2022-10-27 ENCOUNTER — Other Ambulatory Visit: Payer: Self-pay

## 2022-11-01 ENCOUNTER — Other Ambulatory Visit: Payer: Self-pay

## 2022-11-02 ENCOUNTER — Other Ambulatory Visit: Payer: Self-pay

## 2022-11-03 ENCOUNTER — Encounter: Payer: Self-pay | Admitting: Cardiology

## 2022-11-03 ENCOUNTER — Ambulatory Visit: Payer: Medicaid Other | Admitting: Cardiology

## 2022-11-03 VITALS — BP 134/83 | HR 83 | Resp 15 | Ht 65.0 in | Wt 250.0 lb

## 2022-11-03 DIAGNOSIS — I2510 Atherosclerotic heart disease of native coronary artery without angina pectoris: Secondary | ICD-10-CM

## 2022-11-03 DIAGNOSIS — Z9861 Coronary angioplasty status: Secondary | ICD-10-CM

## 2022-11-03 NOTE — Progress Notes (Signed)
ID:  Julie Robertson, DOB 10-10-1975, MRN [id number]  PCP:  Hoy Register, MD  Cardiologist:  Tessa Lerner, DO, [facility] (established care 07/07/2022)  Date: 11/03/22 Last Office Visit: 08/18/2022  Chief Complaint  Patient presents with   Coronary Artery Disease   Follow-up    2 weeks    HPI  Julie Robertson is a 47 y.o. Spanish-speaking female whose past medical history and cardiovascular risk factors include: Coronary artery disease status post PTCA and Cutting Balloon angioplasty to mLCx, left occipital lobe stroke in June 2023, prior infarct right corona radiata (per imaging), insulin dependent type 2 diabetes, dyslipidemia, GERD, .former  smoker (38 pack year history),obesity due to excess calories.    In June 2023 during her stroke workup she was noted to have a positive PFO study by transcranial Doppler as well as TEE.  However, due to lack of insurance coverage she did not follow-up until January 2024.  She has undergone a Zio patch for evaluation for possible atrial fibrillation given her prior stroke and we have discussed considering loop recorder implant since her stroke involved both right and left hemispheres.  However, she wanted to hold off on loop implant at this time.  In the past review of systems were also positive for chest pain which is brought on by stressful situations and physical exertion with sexual activity.  She underwent ischemic workup and was noted to have a positive nuclear stress test and angiography noted obstructive disease in the LCx distribution which she underwent coronary intervention.  Since then she has been on dual antiplatelet therapy and has remained asymptomatic with regards to chest pain.  Since last office visit she has establish care with [facility] and plans to have a sleep study in the near future.  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  She comes in today to be evaluated for palpable superficial veins which she thinks is a side effect of being on Plavix.  However, looking at her weight records she has lost approximately 12 pounds since January 2024.  With loss of subcutaneous fat I reassured her that what she is appreciating is her veins that would initially not visible on surface.  She continues to go to the gym 30 minutes daily predominantly walking and mild resistance training.  Patient is no longer in cardiac rehab according to her.  FUNCTIONAL STATUS: No structured exercise program or daily routine.   ALLERGIES: No Known Allergies  MEDICATION LIST PRIOR TO VISIT: Current Meds  Medication Sig   albuterol (VENTOLIN HFA) 108 (90 Base) MCG/ACT inhaler Inhale 2 puffs into the lungs every 6 (six) hours as needed for wheezing or shortness of breath.   aspirin 81 MG chewable tablet Chew 1 tablet (81 mg total) by mouth daily.   atorvastatin (LIPITOR) 40 MG tablet Take 1 tablet (40 mg total) by mouth at bedtime.   blood glucose meter kit and supplies KIT Use up to four times daily as directed.   Blood Glucose Monitoring Suppl (TRUE METRIX METER) w/Device KIT Use as directed   Blood Glucose Monitoring Suppl (TRUE METRIX METER) w/Device KIT use up to 4 times daily as directed   clopidogrel (PLAVIX) 75 MG tablet Take 1 tablet (75 mg total) by mouth daily with breakfast.   empagliflozin (JARDIANCE) 25 MG TABS tablet Take 1 tablet (25 mg total) by mouth daily before breakfast.  fenofibrate (TRICOR) 145 MG tablet Take 1 tablet (145 mg total) by mouth daily.   gabapentin (NEURONTIN) 300 MG capsule Take 1 capsule (300 mg total) by mouth 3 (three) times daily.   glipiZIDE (GLUCOTROL) 10 MG tablet Take 1 tablet (10 mg total) by mouth 2 (two) times daily.   glucose blood (ACCU-CHEK GUIDE) test strip use as directed   glucose blood (TRUE METRIX BLOOD GLUCOSE TEST) test strip Use as instructed    insulin aspart (NOVOLOG FLEXPEN) 100 UNIT/ML FlexPen Inject 20 Units into the skin 3 (three) times daily with meals.   Insulin Glargine (BASAGLAR KWIKPEN) 100 UNIT/ML Inject 60 Units into the skin daily.   Insulin Pen Needle (TRUEPLUS 5-BEVEL PEN NEEDLES) 32G X 4 MM MISC USE AS DIRECTED   Lancets MISC use as directed   latanoprost (XALATAN) 0.005 % ophthalmic solution Place 1 drop into both eyes every evening.   linaclotide (LINZESS) 145 MCG CAPS capsule Take 1 capsule (145 mcg total) by mouth daily before breakfast.   loratadine (CLARITIN) 10 MG tablet Take 1 tablet (10 mg total) by mouth daily.   metFORMIN (GLUCOPHAGE) 500 MG tablet Take 2 tablets (1,000 mg total) by mouth 2 (two) times daily with a meal.   metoprolol succinate (TOPROL XL) 25 MG 24 hr tablet Take 1 tablet (25 mg total) by mouth daily.   pantoprazole (PROTONIX) 40 MG tablet Take 1 tablet (40 mg total) by mouth daily before breakfast. (Stop taking omeprazole).   Semaglutide, 1 MG/DOSE, 4 MG/3ML SOPN Inject 1 mg as directed once a week.   Semaglutide, 2 MG/DOSE, 8 MG/3ML SOPN Inject 2 mg as directed once a week. Starting on 10/24/2022   tiZANidine (ZANAFLEX) 4 MG tablet Take 1 tablet (4 mg total) by mouth every 8 (eight) hours as needed for muscle spasms.     PAST MEDICAL HISTORY: Past Medical History:  Diagnosis Date   Anxiety    Asthma    CVA (cerebral vascular accident) (HCC)    Diabetes mellitus without complication (HCC)    Diabetic neuropathy (HCC) 12/2013   vs carpal tunnell.  numbness tingling in right fingers. rx with Gabapentin.   Dyslipidemia 08/2009   Dyspnea    Gall stones 08/2009   GERD (gastroesophageal reflux disease)    H/O tubal ligation    Obesity    BMI 41, 250# 03/2015    PAST SURGICAL HISTORY: Past Surgical History:  Procedure Laterality Date   APPLICATION OF WOUND VAC N/A 05/09/2019   Procedure: APPLICATION OF WOUND VAC;  Surgeon: Henrene Dodge, MD;  Location: [facility] ORS;  Service: General;   Laterality: N/A;  [id number]   BUBBLE STUDY  12/24/2021   Procedure: BUBBLE STUDY;  Surgeon: Lewayne Bunting, MD;  Location: [facility] ENDOSCOPY;  Service: Cardiovascular;;   CHOLECYSTECTOMY N/A 03/10/2015   Procedure: LAPAROSCOPIC CHOLECYSTECTOMY WITH INTRAOPERATIVE CHOLANGIOGRAM;  Surgeon: Violeta Gelinas, MD;  Location: [facility] OR;  Service: General;  Laterality: N/A;   CORONARY BALLOON ANGIOPLASTY N/A 08/09/2022   Procedure: CORONARY BALLOON ANGIOPLASTY;  Surgeon: Yates Decamp, MD;  Location: [facility] INVASIVE CV LAB;  Service: Cardiovascular;  Laterality: N/A;   ERCP N/A 03/11/2015   Procedure: ENDOSCOPIC RETROGRADE CHOLANGIOPANCREATOGRAPHY (ERCP);  Surgeon: Rachael Fee, MD;  Location: [facility] ENDOSCOPY;  Service: Endoscopy;  Laterality: N/A;   ESOPHAGOGASTRODUODENOSCOPY (EGD) WITH PROPOFOL N/A 07/25/2018   Procedure: ESOPHAGOGASTRODUODENOSCOPY (EGD) WITH PROPOFOL;  Surgeon: Meridee Score Netty Starring., MD;  Location: [facility] ENDOSCOPY;  Service: Gastroenterology;  Laterality: N/A;   INSERTION OF MESH N/A 05/09/2019  Procedure: INSERTION OF MESH;  Surgeon: Henrene Dodge, MD;  Location: [facility] ORS;  Service: General;  Laterality: N/A;   LEFT HEART CATH AND CORONARY ANGIOGRAPHY N/A 08/09/2022   Procedure: LEFT HEART CATH AND CORONARY ANGIOGRAPHY;  Surgeon: Yates Decamp, MD;  Location: [facility] INVASIVE CV LAB;  Service: Cardiovascular;  Laterality: N/A;   TEE WITHOUT CARDIOVERSION N/A 12/24/2021   Procedure: TRANSESOPHAGEAL ECHOCARDIOGRAM (TEE);  Surgeon: Lewayne Bunting, MD;  Location: [facility] ENDOSCOPY;  Service: Cardiovascular;  Laterality: N/A;   Transduodenal Ampullectomy  09/2015   TUBAL LIGATION     VENTRAL HERNIA REPAIR N/A 05/09/2019   Procedure: HERNIA REPAIR VENTRAL ADULT with MESH;  Surgeon: Henrene Dodge, MD;  Location: [facility] ORS;  Service: General;  Laterality: N/A;    FAMILY HISTORY: The patient family history includes Bone cancer in her maternal grandfather; Diabetes in her father and mother; Heart attack in her father; Heart  disease in her mother; Hypertension in her mother; Kidney disease in her maternal uncle; Other in her son. She was adopted.  SOCIAL HISTORY:  The patient  reports that she has quit smoking. Her smoking use included cigarettes. She has a 6.00 pack-year smoking history. She has never used smokeless tobacco. She reports current alcohol use. She reports that she does not use drugs.  REVIEW OF SYSTEMS: Review of Systems  Constitutional: Positive for weight loss.  Cardiovascular:  Positive for dyspnea on exertion. Negative for chest pain, claudication, irregular heartbeat, leg swelling, near-syncope, orthopnea, palpitations, paroxysmal nocturnal dyspnea and syncope.  Respiratory:  Positive for shortness of breath.   Hematologic/Lymphatic: Negative for bleeding problem.  Musculoskeletal:  Negative for muscle cramps and myalgias.  Neurological:  Negative for dizziness and light-headedness.    PHYSICAL EXAM:    11/03/2022    3:28 PM 10/12/2022    2:40 PM 08/18/2022    3:08 PM  Vitals with BMI  Height 5\' 5"  5\' 5"    Weight 250 lbs 256 lbs   BMI 41.6 42.6   Systolic 134 90 116  Diastolic 83 59 68  Pulse 83 73 74    Physical Exam  Constitutional: No distress.  Age appropriate, hemodynamically stable.   Neck: No JVD present.  Cardiovascular: Normal rate, regular rhythm, S1 normal, S2 normal, intact distal pulses and normal pulses. Exam reveals no gallop, no S3 and no S4.  No murmur heard. Pulmonary/Chest: Effort normal and breath sounds normal. No stridor. She has no wheezes. She has no rales.  Abdominal: Soft. Bowel sounds are normal. She exhibits no distension. There is no abdominal tenderness.  Obese  Musculoskeletal:        General: No edema.     Cervical back: Neck supple.  Neurological: She is alert and oriented to person, place, and time. She has intact cranial nerves (2-12).  Skin: Skin is warm and moist.   RADIOLOGY CT brain without contrast 12/20/2021: Chronic changes with no  acute intracranial process identified.   MRI brain with and without contrast 12/21/2021: Brain MRI: 1. Patchy acute cortical infarcts in the left occipital lobe with extension along the posterior left watershed. 2. Remote perforator infarct the right corona radiata.   Cervical spine: 1. Normal motion degraded appearance of the cord. 2. C5-6 large right paracentral to foraminal protrusion encroaching on the right cord and causing right foraminal impingement.  CT angio head and neck with and without contrast code stroke protocol 12/21/2021: 1. No emergent vascular finding. 2. Atherosclerosis without flow limiting stenosis of major vessels. 3. Left basal ganglia and  left occipital cortex calcifications, are there risk factors for remote neurocysticercosis?  CT of the head without contrast 12/23/2021: 1. No acute intracranial hemorrhage. 2. Patchy left occipital lobe peau attenuation, compatible with known area of infarction.   CARDIAC DATABASE: EKG: 08/18/2022: Sinus rhythm, 75 bpm, without underlying ischemia or injury pattern.  Echocardiogram: 12/21/2021:  1. Left ventricular ejection fraction, by estimation, is 60 to 65%. The  left ventricle has normal function. The left ventricle has no regional  wall motion abnormalities. There is mild concentric left ventricular  hypertrophy. Left ventricular diastolic  parameters were normal.   2. Right ventricular systolic function is normal. The right ventricular  size is normal. Tricuspid regurgitation signal is inadequate for assessing  PA pressure.   3. A small pericardial effusion is present. The pericardial effusion is  circumferential.   4. The mitral valve is normal in structure. No evidence of mitral valve  regurgitation. No evidence of mitral stenosis.   5. The aortic valve is tricuspid. There is mild thickening of the aortic  valve. Aortic valve regurgitation is not visualized. No aortic stenosis is  present.     Transesophageal  echocardiogram 12/24/2021: Atrial septal aneurysm; no shunting noted with color doppler but positive saline microcavitation study suggests PFO. 2.Left ventricular ejection fraction, by estimation, is 55 to 60%. The left ventricle has normal function. The left ventricle has no regional wall motion abnormalities. 3. Right ventricular systolic function is normal. The right ventricular size is normal. 4. No left atrial/left atrial appendage thrombus was detected. 5. The mitral valve is normal in structure. Trivial mitral valve regurgitation. The aortic valve is tricuspid. Aortic valve regurgitation is trivial. Aortic valve sclerosis is present, with no evidence of aortic valve stenosis. 6. Agitated saline contrast bubble study was positive with shunting observed within 3-6 cardiac cycles suggestive of interatrial shunt.  Stress Testing: Exercise nuclear stress test 07/19/2022: Myocardial perfusion is abnormal. Prominent breast attenuation in the inferior wall.  There is mild mid to apical anteroseptal thinning in stress images suggestive of a small reversible mild defect. TID ratio 1.43, which is abnormal. Stress LV EF is normal 52%. Overall LV systolic function is normal without regional wall motion abnormalities. Stress LV EF: 52%.  Normal ECG stress. The patient exercised for 4 minutes and 13 seconds of a Bruce protocol, achieving approximately 6.1 METs & 86% MPHR. NO chest pain, stress terminated due to THR achieved. Normal BP response.  No previous exam available for comparison. Intermediate risk due to abnormal TID ratio. Clinical correlation recommended in a patient with BMI >40. ~~Personally reviewed myocardial perfusion imaging which is more concerning for reversible ischemia in the basal to mid inferolateral and apical lateral segments suggestive of ischemia in the LCx distribution.  Heart Catheterization: Left Heart Catheterization 08/09/22:  LV 130/4, EDP 12 mmHg.  Ao 07/04/2010/72, mean 90  mmHg.  No pressure gradient across the aortic valve. LM: Large-caliber vessel.  Smooth and normal. LAD: Large-caliber vessel.  It is smooth and normal.  Gives origin to a very large diagonal 1 with a secondary branch.  LAD ends before reaching the apex. LCx: Large-caliber vessel.  Gives origin to a very tiny OM1, continues as large OM 2 and 3.  There is a focal 90% concentric stenosis. RCA: Large-caliber vessel.  Slow flow is noted in the right coronary artery.  Mid segment has a minimal luminal irregularity.   Intervention data: Successful PTCA and scoring balloon angioplasty with a 3.5 x 10 mm Wolverine cutting balloon.  Stenosis reduced from 90% to 0% with TIMI-3 to TIMI-3 flow, much better flow postangioplasty.      Recommendation: Patient will need at least 3 months of Plavix, aspirin indefinitely.  Lower extremity venous duplex bilateral: 12/22/2021: - No evidence of deep vein thrombosis seen in the lower extremities, bilaterally.  -No evidence of popliteal cyst, bilaterally.   Transcranial Doppler with bubbles: 12/21/2021 Positive TCD Bubble study with valasalva only indicative of a small right to left shunt   LABORATORY DATA:    Latest Ref Rng & Units 08/10/2022    5:49 AM 07/21/2022   10:10 AM 12/22/2021    3:14 AM  CBC  WBC 4.0 - 10.5 K/uL 5.4  6.2  6.9   Hemoglobin 12.0 - 15.0 g/dL 9.6  16.1  9.5   Hematocrit 36.0 - 46.0 % 32.0  37.3  30.4   Platelets 150 - 400 K/uL 350  404  305        Latest Ref Rng & Units 08/22/2022    2:10 PM 08/10/2022    5:49 AM 07/21/2022   10:10 AM  CMP  Glucose 70 - 99 mg/dL 096  045  409   BUN 6 - 24 mg/dL 15  11  16    Creatinine 0.57 - 1.00 mg/dL 8.11  9.14  7.82   Sodium 134 - 144 mmol/L 135  130  135   Potassium 3.5 - 5.2 mmol/L 4.2  3.7  4.3   Chloride 96 - 106 mmol/L 100  99  101   CO2 20 - 29 mmol/L 21  23  22    Calcium 8.7 - 10.2 mg/dL 9.2  8.7  9.5   Total Protein 6.0 - 8.5 g/dL 7.6   7.6   Total Bilirubin 0.0 - 1.2 mg/dL 0.3   0.3    Alkaline Phos 44 - 121 IU/L 80   88   AST 0 - 40 IU/L 107   92   ALT 0 - 32 IU/L 104   105     Lipid Panel  Lab Results  Component Value Date   CHOL 126 07/21/2022   HDL 26 (L) 07/21/2022   LDLCALC 64 07/21/2022   LDLDIRECT 64 07/21/2022   TRIG 218 (H) 07/21/2022   CHOLHDL 6.0 12/21/2021   No components found for: "NTPROBNP" No results for input(s): "PROBNP" in the last 8760 hours. No results for input(s): "TSH" in the last 8760 hours.  BMP Recent Labs    12/20/21 1616 12/22/21 0314 07/13/22 1613 07/21/22 1010 08/10/22 0549 08/22/22 1410  NA 136 136   < > 135 130* 135  K 3.8 4.0   < > 4.3 3.7 4.2  CL 103 106   < > 101 99 100  CO2 20* 23   < > 22 23 21   GLUCOSE 216* 285*   < > 262* 253* 205*  BUN 16 16   < > 16 11 15   CREATININE 0.81 0.92   < > 0.89 0.76 1.00  CALCIUM 8.8* 8.6*   < > 9.5 8.7* 9.2  GFRNONAA >60 >60  --   --  >60  --    < > = values in this interval not displayed.    HEMOGLOBIN A1C Lab Results  Component Value Date   HGBA1C 11.1 (H) 08/10/2022   MPG 271.87 08/10/2022    IMPRESSION:    ICD-10-CM   1. Atherosclerosis of native coronary artery of native heart without angina pectoris  I25.10 Lipid Panel With LDL/HDL Ratio  LDL cholesterol, direct    CMP14+EGFR    2. S/P PTCA (percutaneous transluminal coronary angioplasty)  Z98.61        RECOMMENDATIONS: Julie Robertson is a 47 y.o. Spanish-speaking female whose past medical history and cardiac risk factors include: Coronary artery disease status post PTCA and Cutting Balloon angioplasty to mLCx, left occipital lobe stroke in June 2023, prior infarct right corona radiata (per imaging), insulin dependent type 2 diabetes, dyslipidemia, GERD, .former  smoker (38 pack year history),obesity due to excess calories.    Atherosclerosis of native coronary artery of native heart without angina pectoris Prior MPI suggestive of reversible ischemia in the LCx distribution, angiography  revealed obstructive disease in the mid LCx underwent PTCA and Cutting Balloon angioplasty. DAPT for at least 3 months-Nov 07, 2022 and aspirin indefinitely. Has lost 12 pounds since January 2024. Reemphasized the importance of improving her modifiable cardiovascular risk factors. Has not required the use of sublingual nitroglycerin tablet. Remains chest pain-free Continue current medical management  Type 2 diabetes mellitus with hyperglycemia, with long-term current use of insulin (HCC) Uncontrolled. Likely has improved given the degree of weight loss since July 05, 2022. Currently managed by primary care provider. Medications reconciled.  Essential hypertension Office blood pressures are well-controlled. Medications reconciled. No changes warranted.  Dyslipidemia Recommend a goal LDL less than 55 mg/dL given obstructive disease -she has premature CAD. Most recent lipid profile from January 2024 illustrates a direct LDL of 64 mg/dL. Will recheck fasting lipid profile prior to her next office visit. Monitor for now  Cerebrovascular accident (CVA), unspecified mechanism (HCC) PFO with atrial septal aneurysm Index event June 2023. Imaging studies illustrate disease in both hemispheres. We discussed proceeding forward with PFO closure at this time; however, patient would like to hold off for now. Will reconsider at the next office visit. Zio patch did not demonstrate evidence of atrial fibrillation during the monitoring period.  He did note nocturnal second-degree type I AV block.  She has been referred to sleep medicine and plans to have a sleep study in the near future. Patient has been wearing imitations/weights daily and has not had any allergic reactions.  Former smoker Re emphasized the importance of complete smoking cessation  Class 3 severe obesity due to excess calories with serious comorbidity and body mass index (BMI) of 40.0 to 44.9 in adult [facility]) Body mass index is  41.6 kg/m. I reviewed with her importance of diet, regular physical activity/exercise, weight loss.   Patient is educated on the importance of increasing physical activity gradually as tolerated with a goal of moderate intensity exercise for 30 minutes a day 5 days a week.  Patient predominantly speaks Spanish and translation services provided by medical assistant during today's encounter.  In addition, the superficial veins that she is appreciating on physical examination are not likely secondary to the loss of adipose tissue given her 12 pounds of weight loss.  There is no asymmetrical swelling, bruising, pain to palpation to suggest underlying pathological process at this time.  Continue to monitor for now.  We did discuss considering vascular vein consult if she still concerned for second opinion.  She would like to hold off for now and will monitor clinically.  FINAL MEDICATION LIST END OF ENCOUNTER: No orders of the defined types were placed in this encounter.   There are no discontinued medications.     Current Outpatient Medications:    albuterol (VENTOLIN HFA) 108 (90 Base) MCG/ACT inhaler, Inhale 2 puffs into the  lungs every 6 (six) hours as needed for wheezing or shortness of breath., Disp: 18 g, Rfl: 1   aspirin 81 MG chewable tablet, Chew 1 tablet (81 mg total) by mouth daily., Disp: 90 tablet, Rfl: 1   atorvastatin (LIPITOR) 40 MG tablet, Take 1 tablet (40 mg total) by mouth at bedtime., Disp: 90 tablet, Rfl: 3   blood glucose meter kit and supplies KIT, Use up to four times daily as directed., Disp: 1 each, Rfl: 11   Blood Glucose Monitoring Suppl (TRUE METRIX METER) w/Device KIT, Use as directed, Disp: 1 kit, Rfl: 0   Blood Glucose Monitoring Suppl (TRUE METRIX METER) w/Device KIT, use up to 4 times daily as directed, Disp: 1 kit, Rfl: 0   clopidogrel (PLAVIX) 75 MG tablet, Take 1 tablet (75 mg total) by mouth daily with breakfast., Disp: 90 tablet, Rfl: 1   empagliflozin  (JARDIANCE) 25 MG TABS tablet, Take 1 tablet (25 mg total) by mouth daily before breakfast., Disp: 90 tablet, Rfl: 1   fenofibrate (TRICOR) 145 MG tablet, Take 1 tablet (145 mg total) by mouth daily., Disp: 90 tablet, Rfl: 1   gabapentin (NEURONTIN) 300 MG capsule, Take 1 capsule (300 mg total) by mouth 3 (three) times daily., Disp: 270 capsule, Rfl: 1   glipiZIDE (GLUCOTROL) 10 MG tablet, Take 1 tablet (10 mg total) by mouth 2 (two) times daily., Disp: 180 tablet, Rfl: 1   glucose blood (ACCU-CHEK GUIDE) test strip, use as directed, Disp: 100 each, Rfl: 6   glucose blood (TRUE METRIX BLOOD GLUCOSE TEST) test strip, Use as instructed, Disp: 100 each, Rfl: 12   insulin aspart (NOVOLOG FLEXPEN) 100 UNIT/ML FlexPen, Inject 20 Units into the skin 3 (three) times daily with meals., Disp: 9 mL, Rfl: 3   Insulin Glargine (BASAGLAR KWIKPEN) 100 UNIT/ML, Inject 60 Units into the skin daily., Disp: 18 mL, Rfl: 3   Insulin Pen Needle (TRUEPLUS 5-BEVEL PEN NEEDLES) 32G X 4 MM MISC, USE AS DIRECTED, Disp: 100 each, Rfl: 3   Lancets MISC, use as directed, Disp: 100 each, Rfl: 0   latanoprost (XALATAN) 0.005 % ophthalmic solution, Place 1 drop into both eyes every evening., Disp: 5 mL, Rfl: 11   linaclotide (LINZESS) 145 MCG CAPS capsule, Take 1 capsule (145 mcg total) by mouth daily before breakfast., Disp: 90 capsule, Rfl: 1   loratadine (CLARITIN) 10 MG tablet, Take 1 tablet (10 mg total) by mouth daily., Disp: 30 tablet, Rfl: 11   metFORMIN (GLUCOPHAGE) 500 MG tablet, Take 2 tablets (1,000 mg total) by mouth 2 (two) times daily with a meal., Disp: 120 tablet, Rfl: 3   metoprolol succinate (TOPROL XL) 25 MG 24 hr tablet, Take 1 tablet (25 mg total) by mouth daily., Disp: 90 tablet, Rfl: 1   pantoprazole (PROTONIX) 40 MG tablet, Take 1 tablet (40 mg total) by mouth daily before breakfast. (Stop taking omeprazole)., Disp: 90 tablet, Rfl: 1   Semaglutide, 1 MG/DOSE, 4 MG/3ML SOPN, Inject 1 mg as directed once a  week., Disp: 3 mL, Rfl: 2   Semaglutide, 2 MG/DOSE, 8 MG/3ML SOPN, Inject 2 mg as directed once a week. Starting on 10/24/2022, Disp: 3 mL, Rfl: 6   tiZANidine (ZANAFLEX) 4 MG tablet, Take 1 tablet (4 mg total) by mouth every 8 (eight) hours as needed for muscle spasms., Disp: 60 tablet, Rfl: 1   nitroGLYCERIN (NITROSTAT) 0.4 MG SL tablet, Place 1 tablet (0.4 mg total) under the tongue every 5 (five) minutes as needed  for chest pain. If you require more than two tablets five minutes apart go to the nearest ER via EMS., Disp: 30 tablet, Rfl: 0  Orders Placed This Encounter  Procedures   Lipid Panel With LDL/HDL Ratio   LDL cholesterol, direct   [id number]   There are no Patient Instructions on file for this visit.   --Continue cardiac medications as reconciled in final medication list. --Return in about 6 months (around 05/08/2023) for Follow up, CAD, Lipid. or sooner if needed. --Continue follow-up with your primary care physician regarding the management of your other chronic comorbid conditions.  Patient's questions and concerns were addressed to her satisfaction. She voices understanding of the instructions provided during this encounter.   This note was created using a voice recognition software as a result there may be grammatical errors inadvertently enclosed that do not reflect the nature of this encounter. Every attempt is made to correct such errors.  Tessa Lerner, [place], [facility]  Pager:  [phone] Office: [phone]

## 2022-11-07 ENCOUNTER — Other Ambulatory Visit: Payer: Self-pay

## 2022-11-08 ENCOUNTER — Other Ambulatory Visit: Payer: Self-pay

## 2022-11-14 ENCOUNTER — Encounter: Payer: Self-pay | Admitting: Pharmacist

## 2022-11-14 ENCOUNTER — Other Ambulatory Visit: Payer: Self-pay

## 2022-11-14 ENCOUNTER — Ambulatory Visit: Payer: Medicaid Other | Attending: Family Medicine | Admitting: Pharmacist

## 2022-11-14 DIAGNOSIS — Z794 Long term (current) use of insulin: Secondary | ICD-10-CM | POA: Diagnosis not present

## 2022-11-14 DIAGNOSIS — E1169 Type 2 diabetes mellitus with other specified complication: Secondary | ICD-10-CM

## 2022-11-14 LAB — POCT GLYCOSYLATED HEMOGLOBIN (HGB A1C): HbA1c, POC (controlled diabetic range): 7.7 % — AB (ref 0.0–7.0)

## 2022-11-14 NOTE — Progress Notes (Signed)
S:    PCP: Dr. Alvis Lemmings  47 y.o. female who presents for diabetes evaluation, education, and management.  PMH is significant for left occipital lobe stroke in 12/2021, PFO, type 2 diabetes mellitus (A1c 10.4), dyslipidemia, GERD, nicotine dependence. Patient was referred by Primary Care Provider, Dr. Alvis Lemmings, on 07/13/2022. Last seen by pharmacy clinic on 09/23/2022 and last seen by PCP on 10/12/2022. At last visit, Ozempic was increased to 2 mg weekly.   Today patient arrives in good spirits and presents with the assistance of an online interpretor. In the beginning of the visit, patient originally reported taking Novolog 20 units TID, but after hearing her A1c result, she endorses she has not taken Novolog in 3 weeks as this caused her BG to go 'too low'. She reports highest BG seen since discontinuing Novolog was 170s. A1c today 7.7%, down from 11.1%  Family/Social History:  Fhx: DM, heart disease, HTN Tobacco: former smoker  Alcohol: none reported  Current diabetes medications include: Basaglar 60u daily, Novolog 20u TID (has not taken in 3 weeks), Ozempic 1 mg weekly, Jardiance 25 mg daily, metformin 1000 mg BID (takes two 500 mg tablets BID), glipizide 10 mg daily  Insurance coverage: [place] Medicaid  Patient denies hypoglycemic events.  Reported home blood sugars: 120s - 170; sometimes 200s when she eats something sugary   No CGM in place or GM with her today.  Patient denies nocturia (nighttime urination).  Patient reports neuropathy (nerve pain). Patient reports visual changes. Patient reports self foot exams.   Patient reported dietary habits:  -Eats 2-3 meals daily  -Since last visit, she tells she has cut out her carbs and has started drinking "green juice".   Patient-reported exercise habits:  -None  O:  ROS  Physical Exam  7 day average blood glucose: no meter with her today.  No CGM in place.  Lab Results  Component Value Date   HGBA1C 7.7 (A) 11/14/2022    There were no vitals filed for this visit.  Lipid Panel     Component Value Date/Time   CHOL 126 07/21/2022 1010   TRIG 218 (H) 07/21/2022 1010   HDL 26 (L) 07/21/2022 1010   CHOLHDL 6.0 12/21/2021 0925   VLDL 45 (H) 12/21/2021 0925   LDLCALC 64 07/21/2022 1010   LDLDIRECT 64 07/21/2022 1010    Clinical Atherosclerotic Cardiovascular Disease (ASCVD): Yes  The ASCVD Risk score (Arnett DK, et al., 2019) failed to calculate for the following reasons:   The patient has a prior MI or stroke diagnosis   A/P: Diabetes longstanding currently slightly above goal based on A1c today (7.7%), but much improved. Congratulated patient on this. Patient is able to verbalize appropriate hypoglycemia management plan. Medication adherence appears optimal. Reports low BG with Novolog, so she has not taken in 3 weeks. Will try to optimize Ozempic given weight and cardiac history.  -Increase Ozempic to 2 mg weekly. Encouraged patient to pick up prescription downstairs.  - HOLD Novolog for now as patient has not taken in 3 weeks and reports hypoglycemia when she was taking. Instructed patient she must closely monitor BG and will consider discounting at follow up if BG remains at goal.  -Continue Basaglar 60 units daily.  -Continued metformin 1000 mg BID.  -Continue Jardiance 25 mg daily. -Continue glipizide 10 mg daily. -Patient educated on purpose, proper use, and potential adverse effects of Ozempic.  -Extensively discussed pathophysiology of diabetes, recommended lifestyle interventions, dietary effects on blood sugar control.  -Counseled  on s/sx of and management of hypoglycemia.  -Next A1c anticipated 02/2023.   Written patient instructions provided. Patient verbalized understanding of treatment plan.  Total time in face to face counseling 30 minutes.    Follow-up:  Pharmacist: 12/20/2022  Valeda Malm, Pharm.D. PGY-2 Ambulatory Care Pharmacy Resident

## 2022-11-18 NOTE — Telephone Encounter (Signed)
Patient spoke with doctor during last office visit.

## 2022-11-25 ENCOUNTER — Other Ambulatory Visit: Payer: Self-pay | Admitting: Family Medicine

## 2022-11-25 ENCOUNTER — Other Ambulatory Visit: Payer: Self-pay | Admitting: Cardiology

## 2022-11-25 ENCOUNTER — Other Ambulatory Visit: Payer: Self-pay

## 2022-11-25 DIAGNOSIS — Z794 Long term (current) use of insulin: Secondary | ICD-10-CM

## 2022-11-25 DIAGNOSIS — E1169 Type 2 diabetes mellitus with other specified complication: Secondary | ICD-10-CM

## 2022-11-25 DIAGNOSIS — I2510 Atherosclerotic heart disease of native coronary artery without angina pectoris: Secondary | ICD-10-CM

## 2022-11-25 DIAGNOSIS — M25512 Pain in left shoulder: Secondary | ICD-10-CM

## 2022-11-25 MED ORDER — TIZANIDINE HCL 4 MG PO TABS
4.0000 mg | ORAL_TABLET | Freq: Three times a day (TID) | ORAL | 1 refills | Status: DC | PRN
Start: 2022-11-25 — End: 2023-12-21
  Filled 2022-11-25: qty 60, 20d supply, fill #0
  Filled 2023-01-12: qty 60, 20d supply, fill #1

## 2022-11-25 MED ORDER — FENOFIBRATE 145 MG PO TABS
145.0000 mg | ORAL_TABLET | Freq: Every day | ORAL | 2 refills | Status: DC
Start: 2022-11-25 — End: 2023-10-23
  Filled 2022-11-25 – 2023-02-01 (×3): qty 90, 90d supply, fill #0
  Filled 2023-05-02: qty 90, 90d supply, fill #1
  Filled 2023-08-01: qty 90, 90d supply, fill #2

## 2022-11-25 MED ORDER — NOVOLOG FLEXPEN 100 UNIT/ML ~~LOC~~ SOPN
20.0000 [IU] | PEN_INJECTOR | Freq: Three times a day (TID) | SUBCUTANEOUS | 3 refills | Status: DC
  Filled 2022-11-25 – 2022-12-05 (×2): qty 15, 25d supply, fill #0
  Filled 2022-12-26: qty 15, 25d supply, fill #1

## 2022-11-25 MED ORDER — GABAPENTIN 300 MG PO CAPS
300.0000 mg | ORAL_CAPSULE | Freq: Three times a day (TID) | ORAL | 2 refills | Status: DC
  Filled 2022-11-25 – 2023-01-31 (×4): qty 270, 90d supply, fill #0
  Filled 2023-05-01: qty 270, 90d supply, fill #1
  Filled 2023-08-01: qty 270, 90d supply, fill #2

## 2022-11-25 NOTE — Telephone Encounter (Signed)
Requested medication (s) are due for refill today:   Not sure  Requested medication (s) are on the active medication list:   Yes  Future visit scheduled:   Yes with Franky Macho, pharmacist for diabetes   Last ordered: 09/23/2022 9 ml, 3 refills  Returned because it is noted she is not taking this any longer on 11/14/2022.  Provider to review   Requested Prescriptions  Pending Prescriptions Disp Refills   insulin aspart (NOVOLOG FLEXPEN) 100 UNIT/ML FlexPen 9 mL 3    Sig: Inject 20 Units into the skin 3 (three) times daily with meals.     Endocrinology:  Diabetes - Insulins Passed - 11/25/2022  3:07 PM      Passed - HBA1C is between 0 and 7.9 and within 180 days    HbA1c, POC (prediabetic range)  Date Value Ref Range Status  03/22/2018 10.9 (A) 5.7 - 6.4 % Final   HbA1c, POC (controlled diabetic range)  Date Value Ref Range Status  11/14/2022 7.7 (A) 0.0 - 7.0 % Final         Passed - Valid encounter within last 6 months    Recent Outpatient Visits           1 week ago Type 2 diabetes mellitus with other specified complication, with long-term current use of insulin [facility])   [place] [facility] [place], [place] L, RPH-CPP   1 month ago Type 2 diabetes mellitus with other specified complication, with long-term current use of insulin (HCC)   [place] [facility] & [facility]ness Center [place], Odette Horns, MD   2 months ago Type 2 diabetes mellitus with other specified complication, with long-term current use of insulin [facility])   [place] [facility] & [facility]ness Center [place], [place] L, RPH-CPP   3 months ago Type 2 diabetes mellitus with other specified complication, with long-term current use of insulin [facility])   [place] [facility] & Wellness Center [place], [place] L, RPH-CPP   4 months ago Type 2 diabetes mellitus with other specified complication, with long-term current use of insulin [facility])   [place] [facility] Hoy Register, MD       Future Appointments             In 3 weeks Lois Huxley, Cornelius Moras, RPH-CPP [place] [facility]   In 5 months [place], [place], DO [facility]

## 2022-11-25 NOTE — Telephone Encounter (Signed)
Requested Prescriptions  Pending Prescriptions Disp Refills   gabapentin (NEURONTIN) 300 MG capsule 270 capsule 2    Sig: Take 1 capsule (300 mg total) by mouth 3 (three) times daily.     Neurology: Anticonvulsants - gabapentin Passed - 11/25/2022  4:35 PM      Passed - Cr in normal range and within 360 days    Creat  Date Value Ref Range Status  08/25/2016 0.69 0.50 - 1.10 mg/dL Final   Creatinine, Ser  Date Value Ref Range Status  08/22/2022 1.00 0.57 - 1.00 mg/dL Final   Creatinine, Urine  Date Value Ref Range Status  08/25/2016 208 20 - 320 mg/dL Final         Passed - Completed PHQ-2 or PHQ-9 in the last 360 days      Passed - Valid encounter within last 12 months    Recent Outpatient Visits           1 week ago Type 2 diabetes mellitus with other specified complication, with long-term current use of insulin (HCC)   [place] [facility]ness Center [place], [place] L, RPH-CPP   1 month ago Type 2 diabetes mellitus with other specified complication, with long-term current use of insulin (HCC)   [place] [facility]ness Center [place], Odette Horns, MD   2 months ago Type 2 diabetes mellitus with other specified complication, with long-term current use of insulin [facility])   [place] [facility] Wellness Center [place], [place] L, RPH-CPP   3 months ago Type 2 diabetes mellitus with other specified complication, with long-term current use of insulin [facility]   [place] [facility] & Wellness Center Gerald, [place] L, RPH-CPP   4 months ago Type 2 diabetes mellitus with other specified complication, with long-term current use of insulin [facility])   [place] [facility] Center Hoy Register, MD       Future Appointments             In 3 weeks [place] Chalet, RPH-CPP [facility]   In 5 months [place], [place], DO [facility]             fenofibrate  (TRICOR) 145 MG tablet 90 tablet 2    Sig: Take 1 tablet (145 mg total) by mouth daily.     Cardiovascular:  Antilipid - Fibric Acid Derivatives Failed - 11/25/2022  4:35 PM      Failed - ALT in normal range and within 360 days    ALT  Date Value Ref Range Status  08/22/2022 104 (H) 0 - 32 IU/L Final         Failed - AST in normal range and within 360 days    AST  Date Value Ref Range Status  08/22/2022 107 (H) 0 - 40 IU/L Final         Failed - HGB in normal range and within 360 days    Hemoglobin  Date Value Ref Range Status  08/10/2022 9.6 (L) 12.0 - 15.0 g/dL Final  16/04/9603 54.0 (L) 11.1 - 15.9 g/dL Final         Failed - HCT in normal range and within 360 days    HCT  Date Value Ref Range Status  08/10/2022 32.0 (L) 36.0 - 46.0 % Final   Hematocrit  Date Value Ref Range Status  07/21/2022 37.3 34.0 - 46.6 % Final  Failed - Lipid Panel in normal range within the last 12 months    Cholesterol, Total  Date Value Ref Range Status  07/21/2022 126 100 - 199 mg/dL Final   LDL Chol Calc (NIH)  Date Value Ref Range Status  07/21/2022 64 0 - 99 mg/dL Final   LDL Direct  Date Value Ref Range Status  07/21/2022 64 0 - 99 mg/dL Final   HDL  Date Value Ref Range Status  07/21/2022 26 (L) >39 mg/dL Final   Triglycerides  Date Value Ref Range Status  07/21/2022 218 (H) 0 - 149 mg/dL Final         Passed - Cr in normal range and within 360 days    Creat  Date Value Ref Range Status  08/25/2016 0.69 0.50 - 1.10 mg/dL Final   Creatinine, Ser  Date Value Ref Range Status  08/22/2022 1.00 0.57 - 1.00 mg/dL Final   Creatinine, Urine  Date Value Ref Range Status  08/25/2016 208 20 - 320 mg/dL Final         Passed - PLT in normal range and within 360 days    Platelets  Date Value Ref Range Status  08/10/2022 350 150 - 400 K/uL Final  07/21/2022 404 150 - 450 x10E3/uL Final         Passed - WBC in normal range and within 360 days    WBC  Date Value  Ref Range Status  08/10/2022 5.4 4.0 - 10.5 K/uL Final         Passed - eGFR is 30 or above and within 360 days    GFR, Est African American  Date Value Ref Range Status  08/25/2016 >89 >=60 mL/min Final   GFR calc Af Amer  Date Value Ref Range Status  05/10/2019 >60 >60 mL/min Final   GFR, Est Non African American  Date Value Ref Range Status  08/25/2016 >89 >=60 mL/min Final   GFR, Estimated  Date Value Ref Range Status  08/10/2022 >60 >60 mL/min Final    Comment:    (NOTE) Calculated using the CKD-EPI Creatinine Equation (2021)    eGFR  Date Value Ref Range Status  08/22/2022 70 >59 mL/min/1.73 Final         Passed - Valid encounter within last 12 months    Recent Outpatient Visits           1 week ago Type 2 diabetes mellitus with other specified complication, with long-term current use of insulin (HCC)   [place] [facility]ness Center [place], [place] L, RPH-CPP   1 month ago Type 2 diabetes mellitus with other specified complication, with long-term current use of insulin (HCC)   [place] [facility] Center [place], Odette Horns, MD   2 months ago Type 2 diabetes mellitus with other specified complication, with long-term current use of insulin [facility])   [place] [facility]ness Center [place], Jeannett Senior L, RPH-CPP   3 months ago Type 2 diabetes mellitus with other specified complication, with long-term current use of insulin [facility])   [place] [facility] & Wellness Center [place], Jeannett Senior L, RPH-CPP   4 months ago Type 2 diabetes mellitus with other specified complication, with long-term current use of insulin [facility])   [place] [facility] Hoy Register, MD       Future Appointments             In 3 weeks Lois Huxley, Jeannett Senior  L, RPH-CPP [facility]   In 5 months [place], [place], [place] [facility]

## 2022-11-25 NOTE — Telephone Encounter (Signed)
Requested medications are due for refill today.  yes  Requested medications are on the active medications list.  yes  Last refill. 09/23/2022 #60 1 rf  Future visit scheduled.   With pharmacy  Notes to clinic.  Refill not delegated.    Requested Prescriptions  Pending Prescriptions Disp Refills   tiZANidine (ZANAFLEX) 4 MG tablet 60 tablet 1    Sig: Take 1 tablet (4 mg total) by mouth every 8 (eight) hours as needed for muscle spasms.     Not Delegated - Cardiovascular:  Alpha-2 Agonists - tizanidine Failed - 11/25/2022  2:54 PM      Failed - This refill cannot be delegated      Passed - Valid encounter within last 6 months    Recent Outpatient Visits           1 week ago Type 2 diabetes mellitus with other specified complication, with long-term current use of insulin [facility])   [place] [facility] Center [place], [place] L, RPH-CPP   1 month ago Type 2 diabetes mellitus with other specified complication, with long-term current use of insulin (HCC)   [place] [facility] & Wellness Center [place], Odette Horns, MD   2 months ago Type 2 diabetes mellitus with other specified complication, with long-term current use of insulin [facility])   [place] [facility] Center [place], [place] L, RPH-CPP   3 months ago Type 2 diabetes mellitus with other specified complication, with long-term current use of insulin [facility])   [place] [facility]ness Center [place], [place] L, RPH-CPP   4 months ago Type 2 diabetes mellitus with other specified complication, with long-term current use of insulin [facility])   [place] [facility] Hoy Register, MD       Future Appointments             In 3 weeks Lois Huxley, Cornelius Moras, RPH-CPP  [facility]   In 5 months [place], [place], DO [facility]

## 2022-11-29 ENCOUNTER — Other Ambulatory Visit: Payer: Self-pay

## 2022-11-29 MED ORDER — METOPROLOL SUCCINATE ER 25 MG PO TB24
25.0000 mg | ORAL_TABLET | Freq: Every day | ORAL | 1 refills | Status: DC
Start: 2022-11-29 — End: 2023-08-07
  Filled 2022-11-29 – 2023-02-14 (×3): qty 90, 90d supply, fill #0
  Filled 2023-05-02 – 2023-05-11 (×3): qty 90, 90d supply, fill #1

## 2022-12-05 ENCOUNTER — Other Ambulatory Visit: Payer: Self-pay

## 2022-12-06 ENCOUNTER — Other Ambulatory Visit: Payer: Self-pay

## 2022-12-13 ENCOUNTER — Other Ambulatory Visit: Payer: Self-pay

## 2022-12-15 ENCOUNTER — Other Ambulatory Visit: Payer: Self-pay

## 2022-12-19 ENCOUNTER — Other Ambulatory Visit: Payer: Self-pay

## 2022-12-20 ENCOUNTER — Other Ambulatory Visit: Payer: Self-pay

## 2022-12-20 ENCOUNTER — Ambulatory Visit: Payer: Medicaid Other | Admitting: Pharmacist

## 2022-12-21 ENCOUNTER — Other Ambulatory Visit: Payer: Self-pay

## 2022-12-21 ENCOUNTER — Other Ambulatory Visit (HOSPITAL_COMMUNITY): Payer: Self-pay

## 2022-12-27 ENCOUNTER — Encounter: Payer: Self-pay | Admitting: Pharmacist

## 2022-12-27 ENCOUNTER — Ambulatory Visit: Payer: Medicaid Other | Attending: Family Medicine | Admitting: Pharmacist

## 2022-12-27 ENCOUNTER — Other Ambulatory Visit: Payer: Self-pay

## 2022-12-27 DIAGNOSIS — E1169 Type 2 diabetes mellitus with other specified complication: Secondary | ICD-10-CM

## 2022-12-27 DIAGNOSIS — Z794 Long term (current) use of insulin: Secondary | ICD-10-CM

## 2022-12-27 MED ORDER — PANTOPRAZOLE SODIUM 40 MG PO TBEC
40.0000 mg | DELAYED_RELEASE_TABLET | Freq: Every day | ORAL | 1 refills | Status: DC
  Filled 2022-12-27: qty 90, 90d supply, fill #0
  Filled 2023-01-12 – 2023-03-31 (×2): qty 90, 90d supply, fill #1

## 2022-12-27 NOTE — Progress Notes (Signed)
    S:    PCP: Dr. Alvis Lemmings  47 y.o. female who presents for diabetes evaluation, education, and management. PMH is significant for left occipital lobe stroke in 12/2021, PFO, type 2 diabetes mellitus (A1c 10.4), dyslipidemia, GERD, nicotine dependence.   Patient was initially referred by Primary Care Provider, Dr. Alvis Lemmings, on 07/13/2022. Last seen by pharmacy clinic on 11/14/2022. We stopped her meal time insulin and continued all other medications. A1c was 7.7 at that visit (down from 11.1%).   Today patient arrives in good spirits and presents with the assistance of an online interpretor. She has no complaints today.   Family/Social History:  Fhx: DM, heart disease, HTN Tobacco: former smoker  Alcohol: none reported  Current diabetes medications include: Basaglar 60u daily, Ozempic 1 mg weekly, Jardiance 25 mg daily, metformin 1000 mg BID (takes two 500 mg tablets BID), glipizide 10 mg daily  Insurance coverage: [place] Medicaid  Patient denies hypoglycemic events.  Reported home blood sugars: 120s - 170s mostly. Highest level is 198.  No CGM in place or GM with her today.  Patient denies nocturia (nighttime urination).  Patient reports neuropathy (nerve pain). Patient reports visual changes. Patient reports self foot exams.   Patient reported dietary habits:  -Eats 2-3 meals daily  -Since last visit, she tells she has cut out her carbs and has started drinking "green juice".   Patient-reported exercise habits:  -None  O:  7 day average blood glucose: no meter with her today.  No CGM in place.  Lab Results  Component Value Date   HGBA1C 7.7 (A) 11/14/2022   There were no vitals filed for this visit.  Lipid Panel     Component Value Date/Time   CHOL 126 07/21/2022 1010   TRIG 218 (H) 07/21/2022 1010   HDL 26 (L) 07/21/2022 1010   CHOLHDL 6.0 12/21/2021 0925   VLDL 45 (H) 12/21/2021 0925   LDLCALC 64 07/21/2022 1010   LDLDIRECT 64 07/21/2022 1010    Clinical  Atherosclerotic Cardiovascular Disease (ASCVD): Yes  The ASCVD Risk score (Arnett DK, et al., 2019) failed to calculate for the following reasons:   The patient has a prior MI or stroke diagnosis   A/P: Diabetes longstanding currently slightly above goal. Patient is able to verbalize appropriate hypoglycemia management plan. Medication adherence appears optimal. She continues to report better control even without Novolog.  -Continue Ozempic 2 mg weekly.  -Continue Basaglar 60 units daily.  -Continued metformin 1000 mg BID.  -Continue Jardiance 25 mg daily. -Continue glipizide 10 mg daily. -Patient educated on purpose, proper use, and potential adverse effects of Ozempic.  -Extensively discussed pathophysiology of diabetes, recommended lifestyle interventions, dietary effects on blood sugar control.  -Counseled on s/sx of and management of hypoglycemia.  -Next A1c anticipated 02/2023.   Written patient instructions provided. Patient verbalized understanding of treatment plan.  Total time in face to face counseling 30 minutes.    Follow-up:  PCP: 03/01/2023 Me: 03/14/2023  Butch Penny, PharmD, BCACP, CPP Clinical Pharmacist [facility] & [facility] [phone]

## 2022-12-28 ENCOUNTER — Other Ambulatory Visit: Payer: Self-pay

## 2023-01-09 ENCOUNTER — Other Ambulatory Visit: Payer: Self-pay

## 2023-01-12 ENCOUNTER — Other Ambulatory Visit: Payer: Self-pay

## 2023-01-12 ENCOUNTER — Other Ambulatory Visit: Payer: Self-pay | Admitting: Family Medicine

## 2023-01-12 MED ORDER — BASAGLAR KWIKPEN 100 UNIT/ML ~~LOC~~ SOPN
60.0000 [IU] | PEN_INJECTOR | Freq: Every day | SUBCUTANEOUS | 2 refills | Status: DC
  Filled 2023-01-12: qty 18, 30d supply, fill #0
  Filled 2023-02-14: qty 18, 30d supply, fill #1
  Filled 2023-03-21: qty 18, 30d supply, fill #2

## 2023-01-13 ENCOUNTER — Other Ambulatory Visit: Payer: Self-pay

## 2023-01-16 ENCOUNTER — Other Ambulatory Visit: Payer: Self-pay

## 2023-01-16 MED ORDER — VITAMIN D3 1.25 MG (50000 UT) PO CAPS
1.0000 | ORAL_CAPSULE | ORAL | 0 refills | Status: DC
  Filled 2023-01-16: qty 12, 84d supply, fill #0

## 2023-01-18 ENCOUNTER — Other Ambulatory Visit (INDEPENDENT_AMBULATORY_CARE_PROVIDER_SITE_OTHER): Payer: Medicaid Other

## 2023-01-18 ENCOUNTER — Encounter: Payer: Self-pay | Admitting: Family

## 2023-01-18 ENCOUNTER — Ambulatory Visit: Payer: Medicaid Other | Admitting: Family

## 2023-01-18 DIAGNOSIS — M25512 Pain in left shoulder: Secondary | ICD-10-CM

## 2023-01-18 DIAGNOSIS — M542 Cervicalgia: Secondary | ICD-10-CM

## 2023-01-18 DIAGNOSIS — G8929 Other chronic pain: Secondary | ICD-10-CM

## 2023-01-18 MED ORDER — LIDOCAINE HCL 1 % IJ SOLN
5.0000 mL | INTRAMUSCULAR | Status: AC | PRN
Start: 2023-01-18 — End: 2023-01-18
  Administered 2023-01-18: 5 mL

## 2023-01-18 MED ORDER — METHYLPREDNISOLONE ACETATE 40 MG/ML IJ SUSP
40.0000 mg | INTRAMUSCULAR | Status: AC | PRN
Start: 2023-01-18 — End: 2023-01-18
  Administered 2023-01-18: 40 mg via INTRA_ARTICULAR

## 2023-01-18 NOTE — Progress Notes (Signed)
Office Visit Note   Patient: Julie Robertson           Date of Birth: [birth year]           MRN: [id number] Visit Date: 01/18/2023              Requested by: Hoy Register, MD [address] [place] [place],  [place] [address] PCP: Hoy Register, MD  Chief Complaint  Patient presents with   Left Shoulder - Pain      HPI: The patient is a 47 year old woman who is seen for a 13-month history of left shoulder.  No associated injury she is having significant pain at night difficulty getting comfortable she has pain difficulty reaching above her head she cannot do her hair she has pain with rotation of her arm pain shooting down her upper arm at times aching pain in her left neck has a little bit of weakness in her left hand no numbness no tingling  Assessment & Plan: Visit Diagnoses:  1. Chronic left shoulder pain   2. Neck pain    plan: Depo-Medrol injection left shoulder.  Patient tolerated well.  She will follow-up in 4 weeks.  If she fails to improve consider MRI of the shoulder  Follow-Up Instructions: No follow-ups on file.   Back Exam   Tenderness  The patient is experiencing no tenderness.    Right Hand Exam   Muscle Strength  Grip: 5/5    Left Hand Exam   Muscle Strength  Grip:  5/5    Left Shoulder Exam   Tenderness  The patient is experiencing tenderness in the biceps tendon and acromioclavicular joint.  Range of Motion  The patient has normal left shoulder ROM.  Tests  Cross arm: positive Impingement: positive Drop arm: negative  Other  Erythema: absent       Patient is alert, oriented, no adenopathy, well-dressed, normal affect, normal respiratory effort.   Imaging: No results found. No images are attached to the encounter.  Labs: Lab Results  Component Value Date   HGBA1C 7.7 (A) 11/14/2022   HGBA1C 11.1 (H) 08/10/2022   HGBA1C 10.4 (A) 07/13/2022   REPTSTATUS 04/16/2010 FINAL 04/15/2010   CULT   04/15/2010    Multiple bacterial morphotypes present, none predominant. Suggest appropriate recollection if clinically indicated.     Lab Results  Component Value Date   ALBUMIN 3.9 08/22/2022   ALBUMIN 3.9 07/21/2022   ALBUMIN 4.1 07/13/2022    Lab Results  Component Value Date   MG 1.9 05/10/2019   No results found for: "VD25OH"  No results found for: "PREALBUMIN"    Latest Ref Rng & Units 08/10/2022    5:49 AM 07/21/2022   10:10 AM 12/22/2021    3:14 AM  CBC EXTENDED  WBC 4.0 - 10.5 K/uL 5.4  6.2  6.9   RBC 3.87 - 5.11 MIL/uL 4.50  5.07  4.20   Hemoglobin 12.0 - 15.0 g/dL 9.6  01.0  9.5   HCT 27.2 - 46.0 % 32.0  37.3  30.4   Platelets 150 - 400 K/uL 350  404  305      There is no height or weight on file to calculate BMI.  Orders:  Orders Placed This Encounter  Procedures   XR Shoulder Left   XR Cervical Spine 2 or 3 views   No orders of the defined types were placed in this encounter.    Procedures: Large Joint Inj: L subacromial bursa on  01/18/2023 4:44 PM Indications: diagnostic evaluation and pain Details: 22 G 1.5 in needle  Arthrogram: No  Medications: 5 mL lidocaine 1 %; 40 mg methylPREDNISolone acetate 40 MG/ML Outcome: tolerated well, no immediate complications Procedure, treatment alternatives, risks and benefits explained, specific risks discussed. Consent was given by the patient. Immediately prior to procedure a time out was called to verify the correct patient, procedure, equipment, support staff and site/side marked as required. Patient was prepped and draped in the usual sterile fashion.      Clinical Data: No additional findings.  ROS:  All other systems negative, except as noted in the HPI. Review of Systems  Constitutional: Negative.   Musculoskeletal:  Positive for arthralgias, myalgias and neck pain.  Neurological:  Negative for weakness and numbness.    Objective: Vital Signs: There were no vitals taken for this  visit.  Specialty Comments:  No specialty comments available.  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   Acute pulmonary edema (HCC) 09/06/2020   Incisional hernia, without obstruction or gangrene 05/09/2019   Uncontrolled type 2 diabetes mellitus with hyperglycemia, with long-term current use of insulin (HCC) 04/12/2017   Essential hypertension 04/12/2017   Morbid (severe) obesity due to excess calories (HCC) 10/18/2016   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   Iron deficiency anemia 09/02/2015   Asthma 08/28/2015   Type 2 diabetes mellitus (HCC) 07/31/2015   Dyslipidemia 12/04/2013   Numbness and tingling in right hand 12/04/2013   Smoking 12/04/2013   Elevated BP 12/04/2013   Pap smear, high-risk (screening, no prior abnormality) 02/13/2013   Exposure to STD 02/13/2013   Past Medical History:  Diagnosis Date   Anxiety    Asthma    CVA (cerebral vascular accident) (HCC)    Diabetes mellitus without complication (HCC)    Diabetic neuropathy (HCC) 12/2013   vs carpal tunnell.  numbness tingling in right fingers. rx with Gabapentin.   Dyslipidemia 08/2009   Dyspnea    Gall stones 08/2009   GERD (gastroesophageal reflux disease)    H/O tubal ligation    Obesity    BMI 41, 250# 03/2015    Family History  Adopted: Yes  Problem Relation Age of Onset   Diabetes Mother    Heart disease Mother    Hypertension Mother     Heart attack Father    Diabetes Father    Kidney disease Maternal Uncle    Bone cancer Maternal Grandfather    Other Son        had kidney removed due to gun shot wound    Past Surgical History:  Procedure Laterality Date   APPLICATION OF WOUND VAC N/A 05/09/2019   Procedure: APPLICATION OF WOUND VAC;  Surgeon: Henrene Dodge, MD;  Location: [facility] ORS;  Service: General;  LateralityGretta Cool;  [id number]   BUBBLE STUDY  12/24/2021   Procedure: BUBBLE STUDY;  Surgeon: Lewayne Bunting, MD;  Location: [facility] ENDOSCOPY;  Service: Cardiovascular;;  CHOLECYSTECTOMY N/A 03/10/2015   Procedure: LAPAROSCOPIC CHOLECYSTECTOMY WITH INTRAOPERATIVE CHOLANGIOGRAM;  Surgeon: Violeta Gelinas, MD;  Location: [facility] OR;  Service: General;  Laterality: N/A;   CORONARY BALLOON ANGIOPLASTY N/A 08/09/2022   Procedure: CORONARY BALLOON ANGIOPLASTY;  Surgeon: Yates Decamp, MD;  Location: [facility] INVASIVE CV LAB;  Service: Cardiovascular;  Laterality: N/A;   ERCP N/A 03/11/2015   Procedure: ENDOSCOPIC RETROGRADE CHOLANGIOPANCREATOGRAPHY (ERCP);  Surgeon: Rachael Fee, MD;  Location: [facility] ENDOSCOPY;  Service: Endoscopy;  Laterality: N/A;   ESOPHAGOGASTRODUODENOSCOPY (EGD) WITH PROPOFOL N/A 07/25/2018   Procedure: ESOPHAGOGASTRODUODENOSCOPY (EGD) WITH PROPOFOL;  Surgeon: Meridee Score Netty Starring., MD;  Location: [facility] ENDOSCOPY;  Service: Gastroenterology;  Laterality: N/A;   INSERTION OF MESH N/A 05/09/2019   Procedure: INSERTION OF MESH;  Surgeon: Henrene Dodge, MD;  Location: [facility] ORS;  Service: General;  Laterality: N/A;   LEFT HEART CATH AND CORONARY ANGIOGRAPHY N/A 08/09/2022   Procedure: LEFT HEART CATH AND CORONARY ANGIOGRAPHY;  Surgeon: Yates Decamp, MD;  Location: [facility] INVASIVE CV LAB;  Service: Cardiovascular;  Laterality: N/A;   TEE WITHOUT CARDIOVERSION N/A 12/24/2021   Procedure: TRANSESOPHAGEAL ECHOCARDIOGRAM (TEE);  Surgeon: Lewayne Bunting, MD;  Location: [facility] ENDOSCOPY;  Service: Cardiovascular;  Laterality: N/A;   Transduodenal  Ampullectomy  09/2015   TUBAL LIGATION     VENTRAL HERNIA REPAIR N/A 05/09/2019   Procedure: HERNIA REPAIR VENTRAL ADULT with MESH;  Surgeon: Henrene Dodge, MD;  Location: [facility] ORS;  Service: General;  Laterality: N/A;   Social History   Occupational History   Occupation: house cleaning  Tobacco Use   Smoking status: Former    Current packs/day: 1.50    Average packs/day: 1.5 packs/day for 4.0 years (6.0 ttl pk-yrs)    Types: Cigarettes   Smokeless tobacco: Never  Vaping Use   Vaping status: Never Used  Substance and Sexual Activity   Alcohol use: Yes    Comment: rare   Drug use: No   Sexual activity: Not on file

## 2023-01-19 ENCOUNTER — Other Ambulatory Visit: Payer: Self-pay

## 2023-01-20 ENCOUNTER — Other Ambulatory Visit: Payer: Self-pay

## 2023-01-24 ENCOUNTER — Other Ambulatory Visit: Payer: Self-pay

## 2023-01-30 ENCOUNTER — Other Ambulatory Visit: Payer: Self-pay

## 2023-01-31 ENCOUNTER — Other Ambulatory Visit (HOSPITAL_COMMUNITY): Payer: Self-pay

## 2023-01-31 ENCOUNTER — Other Ambulatory Visit: Payer: Self-pay

## 2023-02-01 ENCOUNTER — Other Ambulatory Visit: Payer: Self-pay

## 2023-02-02 ENCOUNTER — Other Ambulatory Visit: Payer: Self-pay

## 2023-02-06 ENCOUNTER — Other Ambulatory Visit (HOSPITAL_COMMUNITY): Payer: Self-pay

## 2023-02-06 ENCOUNTER — Other Ambulatory Visit: Payer: Self-pay

## 2023-02-14 ENCOUNTER — Other Ambulatory Visit: Payer: Self-pay

## 2023-02-16 ENCOUNTER — Ambulatory Visit: Payer: Medicaid Other | Admitting: Cardiology

## 2023-02-22 ENCOUNTER — Ambulatory Visit (INDEPENDENT_AMBULATORY_CARE_PROVIDER_SITE_OTHER): Payer: Medicaid Other | Admitting: Family

## 2023-02-22 ENCOUNTER — Encounter: Payer: Self-pay | Admitting: Family

## 2023-02-22 DIAGNOSIS — G8911 Acute pain due to trauma: Secondary | ICD-10-CM | POA: Diagnosis not present

## 2023-02-22 DIAGNOSIS — M25512 Pain in left shoulder: Secondary | ICD-10-CM

## 2023-02-22 DIAGNOSIS — L989 Disorder of the skin and subcutaneous tissue, unspecified: Secondary | ICD-10-CM

## 2023-02-22 NOTE — Progress Notes (Signed)
Office Visit Note   Patient: Julie Robertson           Date of Birth: [birth year]           MRN: [id number] Visit Date: 02/22/2023              Requested by: Hoy Register, MD [address] [place],  [place] [address] PCP: Hoy Register, MD  No chief complaint on file.     HPI: The patient is a 47 year old woman who is seen in follow-up for left shoulder pain following an injury.  She recalls there was a specific moment in time when she felt something pop in her shoulder and has had difficulty with range of motion as well as weakness on the left upper extremity since.  She has attempted home exercise as well as working out at the gym to increase her strength without any improvement.  She has also had a Depo-Medrol injection with only about a week of relief of her pain.  Now has been ongoing for about 4 months worse pain at night.  Difficulty getting comfortable to go to sleep.  Pain and difficulty reaching above head and behind back.  At times shooting aching throbbing pain down the upper extremity no neck pain no numbness no tingling  She is also concerned about a lesion to the left upper arm posterior aspect she reports this has been getting bigger has been present for months to years but has increased in size recently has been getting caught on clothing and is painful no drainage no warmth  Assessment & Plan: Visit Diagnoses: No diagnosis found.  Plan: Will plan for MRI of the left shoulder  Will refer to dermatology for the skin lesion  Follow-Up Instructions: No follow-ups on file.   Left Shoulder Exam   Tenderness  The patient is experiencing tenderness in the biceps tendon.  Range of Motion  The patient has normal left shoulder ROM.  Muscle Strength  Biceps: 3/5   Tests  Impingement: positive Drop arm: negative  Other  Erythema: absent Pulse: present       Patient is alert, oriented, no adenopathy, well-dressed, normal  affect, normal respiratory effort.   Imaging: No results found. No images are attached to the encounter.  Labs: Lab Results  Component Value Date   HGBA1C 7.7 (A) 11/14/2022   HGBA1C 11.1 (H) 08/10/2022   HGBA1C 10.4 (A) 07/13/2022   REPTSTATUS 04/16/2010 FINAL 04/15/2010   CULT  04/15/2010    Multiple bacterial morphotypes present, none predominant. Suggest appropriate recollection if clinically indicated.     Lab Results  Component Value Date   ALBUMIN 3.9 08/22/2022   ALBUMIN 3.9 07/21/2022   ALBUMIN 4.1 07/13/2022    Lab Results  Component Value Date   MG 1.9 05/10/2019   No results found for: "VD25OH"  No results found for: "PREALBUMIN"    Latest Ref Rng & Units 08/10/2022    5:49 AM 07/21/2022   10:10 AM 12/22/2021    3:14 AM  CBC EXTENDED  WBC 4.0 - 10.5 K/uL 5.4  6.2  6.9   RBC 3.87 - 5.11 MIL/uL 4.50  5.07  4.20   Hemoglobin 12.0 - 15.0 g/dL 9.6  21.3  9.5   HCT 08.6 - 46.0 % 32.0  37.3  30.4   Platelets 150 - 400 K/uL 350  404  305      There is no height or weight on file to calculate BMI.  Orders:  No orders of the defined types were placed in this encounter.  No orders of the defined types were placed in this encounter.    Procedures: No procedures performed  Clinical Data: No additional findings.  ROS:  All other systems negative, except as noted in the HPI. Review of Systems  Objective: Vital Signs: There were no vitals taken for this visit.  Specialty Comments:  No specialty comments available.  PMFS History: Patient Active Problem List   Diagnosis Date Noted   Atherosclerosis of native coronary artery of native heart with stable angina pectoris (HCC) 08/10/2022   Mixed hyperlipidemia 08/10/2022   Hypertriglyceridemia 08/10/2022   History of stroke 08/10/2022   Class 3 severe obesity due to excess calories with serious comorbidity and body mass index (BMI) of 40.0 to 44.9 in adult [facility]) 08/10/2022   PFO (patent foramen ovale)  08/10/2022   Post PTCA 08/09/2022   PFO with atrial septal aneurysm 12/24/2021   Acute ischemic stroke (HCC) 12/21/2021   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   Acute pulmonary edema (HCC) 09/06/2020   Incisional hernia, without obstruction or gangrene 05/09/2019   Uncontrolled type 2 diabetes mellitus with hyperglycemia, with long-term current use of insulin (HCC) 04/12/2017   Essential hypertension 04/12/2017   Morbid (severe) obesity due to excess calories (HCC) 10/18/2016   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   Iron deficiency anemia 09/02/2015   Asthma 08/28/2015   Type 2 diabetes mellitus (HCC) 07/31/2015   Dyslipidemia 12/04/2013   Numbness and tingling in right hand 12/04/2013   Smoking 12/04/2013   Elevated BP 12/04/2013   Pap smear, high-risk (screening, no prior abnormality) 02/13/2013   Exposure to STD 02/13/2013   Past Medical History:  Diagnosis Date   Anxiety    Asthma    CVA (cerebral vascular accident) (HCC)    Diabetes mellitus without complication (HCC)    Diabetic neuropathy (HCC) 12/2013   vs carpal tunnell.  numbness tingling in right fingers. rx with Gabapentin.   Dyslipidemia 08/2009   Dyspnea    Gall stones 08/2009   GERD (gastroesophageal reflux disease)    H/O tubal ligation    Obesity    BMI 41, 250# 03/2015    Family History  Adopted: Yes  Problem Relation Age of Onset   Diabetes Mother    Heart disease Mother    Hypertension Mother    Heart attack Father    Diabetes Father    Kidney disease Maternal Uncle    Bone cancer Maternal Grandfather    Other Son        had kidney removed due to gun shot wound    Past Surgical History:  Procedure Laterality Date   APPLICATION OF WOUND VAC N/A 05/09/2019   Procedure: APPLICATION OF WOUND VAC;  Surgeon: Henrene Dodge, MD;  Location: [facility] ORS;  Service: General;  LateralityGretta Cool;  [id number]   BUBBLE STUDY  12/24/2021   Procedure: BUBBLE STUDY;  Surgeon: Lewayne Bunting, MD;  Location: [facility]  ENDOSCOPY;  Service: Cardiovascular;;   CHOLECYSTECTOMY N/A 03/10/2015   Procedure: LAPAROSCOPIC CHOLECYSTECTOMY WITH INTRAOPERATIVE CHOLANGIOGRAM;  Surgeon: Violeta Gelinas, MD;  Location: [facility] OR;  Service: General;  Laterality: N/A;   CORONARY BALLOON ANGIOPLASTY N/A 08/09/2022   Procedure: CORONARY BALLOON ANGIOPLASTY;  Surgeon: Yates Decamp, MD;  Location: [facility] INVASIVE CV LAB;  Service: Cardiovascular;  Laterality: N/A;   ERCP N/A 03/11/2015   Procedure: ENDOSCOPIC RETROGRADE CHOLANGIOPANCREATOGRAPHY (ERCP);  Surgeon: Rachael Fee, MD;  Location: [facility] ENDOSCOPY;  Service: Endoscopy;  Laterality: N/A;   ESOPHAGOGASTRODUODENOSCOPY (EGD) WITH PROPOFOL N/A 07/25/2018   Procedure: ESOPHAGOGASTRODUODENOSCOPY (EGD) WITH PROPOFOL;  Surgeon: Meridee Score Netty Starring., MD;  Location: [facility] ENDOSCOPY;  Service: Gastroenterology;  Laterality: N/A;   INSERTION OF MESH N/A 05/09/2019   Procedure: INSERTION OF MESH;  Surgeon: Henrene Dodge, MD;  Location: [facility] ORS;  Service: General;  Laterality: N/A;   LEFT HEART CATH AND CORONARY ANGIOGRAPHY N/A 08/09/2022   Procedure: LEFT HEART CATH AND CORONARY ANGIOGRAPHY;  Surgeon: Yates Decamp, MD;  Location: [facility] INVASIVE CV LAB;  Service: Cardiovascular;  Laterality: N/A;   TEE WITHOUT CARDIOVERSION N/A 12/24/2021   Procedure: TRANSESOPHAGEAL ECHOCARDIOGRAM (TEE);  Surgeon: Lewayne Bunting, MD;  Location: [facility] ENDOSCOPY;  Service: Cardiovascular;  Laterality: N/A;   Transduodenal Ampullectomy  09/2015   TUBAL LIGATION     VENTRAL HERNIA REPAIR N/A 05/09/2019   Procedure: HERNIA REPAIR VENTRAL ADULT with MESH;  Surgeon: Henrene Dodge, MD;  Location: [facility] ORS;  Service: General;  Laterality: N/A;   Social History   Occupational History   Occupation: house cleaning  Tobacco Use   Smoking status: Former    Current packs/day: 1.50    Average packs/day: 1.5 packs/day for 4.0 years (6.0 ttl pk-yrs)    Types: Cigarettes   Smokeless tobacco: Never  Vaping Use   Vaping status: Never Used   Substance and Sexual Activity   Alcohol use: Yes    Comment: rare   Drug use: No   Sexual activity: Not on file

## 2023-02-27 ENCOUNTER — Other Ambulatory Visit: Payer: Self-pay

## 2023-03-01 ENCOUNTER — Ambulatory Visit: Payer: Medicaid Other | Admitting: Family Medicine

## 2023-03-02 ENCOUNTER — Other Ambulatory Visit: Payer: Self-pay

## 2023-03-02 MED ORDER — FREESTYLE LIBRE 3 SENSOR MISC
3 refills | Status: DC
  Filled 2023-03-02: qty 2, 28d supply, fill #0
  Filled 2023-04-13: qty 2, 28d supply, fill #1
  Filled 2023-05-24 (×2): qty 2, 28d supply, fill #2

## 2023-03-03 ENCOUNTER — Other Ambulatory Visit: Payer: Self-pay

## 2023-03-08 ENCOUNTER — Other Ambulatory Visit: Payer: Self-pay

## 2023-03-09 ENCOUNTER — Other Ambulatory Visit: Payer: Self-pay

## 2023-03-10 ENCOUNTER — Other Ambulatory Visit: Payer: Self-pay

## 2023-03-14 ENCOUNTER — Other Ambulatory Visit: Payer: Self-pay

## 2023-03-14 ENCOUNTER — Ambulatory Visit: Payer: Medicaid Other | Admitting: Pharmacist

## 2023-03-15 ENCOUNTER — Other Ambulatory Visit: Payer: Self-pay

## 2023-03-16 ENCOUNTER — Other Ambulatory Visit: Payer: Self-pay

## 2023-03-17 ENCOUNTER — Ambulatory Visit
Admission: RE | Admit: 2023-03-17 | Discharge: 2023-03-17 | Disposition: A | Discharge status: Home / Self Care | Payer: Medicaid Other | Source: Ambulatory Visit | Attending: Family

## 2023-03-17 ENCOUNTER — Other Ambulatory Visit: Payer: Self-pay

## 2023-03-17 DIAGNOSIS — G8911 Acute pain due to trauma: Secondary | ICD-10-CM

## 2023-03-21 ENCOUNTER — Ambulatory Visit: Payer: Medicaid Other | Attending: Family Medicine | Admitting: Pharmacist

## 2023-03-21 ENCOUNTER — Telehealth: Payer: Self-pay | Admitting: Pharmacist

## 2023-03-21 ENCOUNTER — Other Ambulatory Visit: Payer: Self-pay

## 2023-03-21 ENCOUNTER — Telehealth: Payer: Self-pay

## 2023-03-21 ENCOUNTER — Encounter: Payer: Self-pay | Admitting: Pharmacist

## 2023-03-21 VITALS — Wt 238.8 lb

## 2023-03-21 DIAGNOSIS — Z7985 Long-term (current) use of injectable non-insulin antidiabetic drugs: Secondary | ICD-10-CM

## 2023-03-21 DIAGNOSIS — E1169 Type 2 diabetes mellitus with other specified complication: Secondary | ICD-10-CM

## 2023-03-21 DIAGNOSIS — Z7984 Long term (current) use of oral hypoglycemic drugs: Secondary | ICD-10-CM | POA: Diagnosis not present

## 2023-03-21 DIAGNOSIS — Z794 Long term (current) use of insulin: Secondary | ICD-10-CM | POA: Diagnosis not present

## 2023-03-21 LAB — POCT GLYCOSYLATED HEMOGLOBIN (HGB A1C): HbA1c, POC (controlled diabetic range): 6.4 % (ref 0.0–7.0)

## 2023-03-21 NOTE — Telephone Encounter (Signed)
Pharmacy Patient Advocate Encounter  Received notification from [facility] MEDICAID that Prior Authorization for FREESTYLE LIBRE 3 SENSOR has been APPROVED from 03/21/2023 to 09/18/2023   PA #/Case ID/Reference #: [id number]

## 2023-03-21 NOTE — Progress Notes (Signed)
S:    47 y.o. female who presents for diabetes evaluation, education, and management. Patient's chart shows that diabetes was diagnosed ~ 2017. PMH is significant for left occipital lobe stroke in 12/2021, PFO, type 2 diabetes mellitus, dyslipidemia, GERD, nicotine dependence.   Patient was initially referred by Primary Care Provider, Dr. Alvis Lemmings, on 07/13/2022 and was last seen on 10/12/2022. Last seen by pharmacy clinic on 12/27/2022. At last visit, diabetes medication regimen was continued although we did discontinue Novolog d/t pt not taking. Last A1c on 11/14/2022 was 7.7% (down from 11.1%).   Today, patient arrives in good spirits and presents without any assistance. Of note, she is consulting with bariatrics (Dr. Adolphus Birchwood), who is considering surgery at this time. Next bariatrics appointment scheduled for 03/30/2023.   Family/Social History:  Fhx: DM, heart disease, HTN Tobacco: former smoker  Alcohol: none reported  Insurance coverage: [place] Medicaid   Current diabetes medications include: Basaglar 60u daily, Ozempic 2 mg weekly, Jardiance 25 mg daily, metformin 1000 mg BID (takes two 500 mg tablets BID), glipizide 10 mg daily  Current hypertension medications include: metoprolol succinate 25 mg daily - discontinued losartan to prevent medication induced hypotension at last PCP visit  Current hyperlipidemia medications include: atorvastatin 40 mg, fenofibrate 145 mg  Patient reports adherence to taking all medications as prescribed.   Insurance coverage: [place] Medicaid  Patient reports 3 isolated hypoglycemic events. Gives readings of 69, 74, 79  - Eats sugar or candies when this occurs  Reported home fasting blood sugars: 100-120  Reported 2 hour post-meal/random blood sugars: not checking  Patient denies nocturia (nighttime urination).  Patient denies neuropathy (nerve pain). Patient denies visual changes. Patient reports self foot exams.   Patient reported dietary habits: Eats 2-3  meals/day - Repots she has continued to cut out her carbs and has started drinking pre-workout   Patient-reported exercise habits:  - Now walking or completing cardio every day  O:  Today's Vitals   03/21/23 1541  Weight: 238 lb 12.8 oz (108.3 kg)   Body mass index is 39.74 kg/m.   Lab Results  Component Value Date   HGBA1C 6.4 03/21/2023   There were no vitals filed for this visit.  Lipid Panel     Component Value Date/Time   CHOL 126 07/21/2022 1010   TRIG 218 (H) 07/21/2022 1010   HDL 26 (L) 07/21/2022 1010   CHOLHDL 6.0 12/21/2021 0925   VLDL 45 (H) 12/21/2021 0925   LDLCALC 64 07/21/2022 1010   LDLDIRECT 64 07/21/2022 1010    Clinical Atherosclerotic Cardiovascular Disease (ASCVD): Yes  The ASCVD Risk score (Arnett DK, et al., 2019) failed to calculate for the following reasons:   The patient has a prior MI or stroke diagnosis   Patient is participating in a Managed Medicaid Plan: No   A/P: Diabetes longstanding, currently controlled based on today's A1c of 6.4% (goal <7%). Commended patient for this. She is able to verbalize appropriate hypoglycemia management plan. Medication adherence appears good. -Continue Ozempic 2 mg weekly.  -Continue Basaglar 60 units daily.  -Continued metformin 1000 mg BID.  -Continue Jardiance 25 mg daily. -Continue glipizide 10 mg daily. -Working on PA for CGM  -Patient educated on purpose, proper use, and potential adverse effects of medications.  -Extensively discussed pathophysiology of diabetes, recommended lifestyle interventions, dietary effects on blood sugar control.  -Counseled on s/sx of and management of hypoglycemia.  -Next A1c anticipated 06/2023.   ASCVD risk - secondary prevention in patient with  diabetes. Last LDL is 64, at goal of <70 mg/dL from 0/98/1191. ASCVD risk factors include hx of stroke, DM, HTN. high intensity statin indicated.  -Continued atorvastatin 40 mg and fenofibrate  Hypertension longstanding,  currently controlled. Blood pressure goal of <130/80 mmHg. Medication adherence good.  -Continued metoprolol succinate 25 mg daily  Written patient instructions provided. Patient verbalized understanding of treatment plan.  Total time in face to face counseling 30 minutes.    Follow-up:  Pharmacist: no longer needed - pharmacist will sign off PCP clinic visit on 03/29/2023  Roslyn Smiling, PharmD PGY1 Pharmacy Resident 03/21/2023 3:30 PM

## 2023-03-21 NOTE — Telephone Encounter (Signed)
Can we attempt a PA of Libre3 for this patient?

## 2023-03-22 ENCOUNTER — Other Ambulatory Visit: Payer: Self-pay

## 2023-03-23 ENCOUNTER — Other Ambulatory Visit: Payer: Self-pay

## 2023-03-23 ENCOUNTER — Encounter: Payer: Self-pay | Admitting: Cardiology

## 2023-03-24 ENCOUNTER — Other Ambulatory Visit: Payer: Self-pay

## 2023-03-29 ENCOUNTER — Other Ambulatory Visit: Payer: Self-pay

## 2023-03-29 ENCOUNTER — Ambulatory Visit: Payer: Medicaid Other | Attending: Family Medicine | Admitting: Family Medicine

## 2023-03-29 ENCOUNTER — Encounter: Payer: Self-pay | Admitting: Family Medicine

## 2023-03-29 VITALS — BP 106/72 | HR 73 | Ht <= 58 in | Wt 238.0 lb

## 2023-03-29 DIAGNOSIS — K5909 Other constipation: Secondary | ICD-10-CM

## 2023-03-29 DIAGNOSIS — Z794 Long term (current) use of insulin: Secondary | ICD-10-CM

## 2023-03-29 DIAGNOSIS — E785 Hyperlipidemia, unspecified: Secondary | ICD-10-CM

## 2023-03-29 DIAGNOSIS — K219 Gastro-esophageal reflux disease without esophagitis: Secondary | ICD-10-CM

## 2023-03-29 DIAGNOSIS — E1159 Type 2 diabetes mellitus with other circulatory complications: Secondary | ICD-10-CM

## 2023-03-29 DIAGNOSIS — I152 Hypertension secondary to endocrine disorders: Secondary | ICD-10-CM

## 2023-03-29 DIAGNOSIS — E1169 Type 2 diabetes mellitus with other specified complication: Secondary | ICD-10-CM | POA: Diagnosis not present

## 2023-03-29 DIAGNOSIS — Z6841 Body Mass Index (BMI) 40.0 and over, adult: Secondary | ICD-10-CM

## 2023-03-29 MED ORDER — BASAGLAR KWIKPEN 100 UNIT/ML ~~LOC~~ SOPN
38.0000 [IU] | PEN_INJECTOR | Freq: Every day | SUBCUTANEOUS | 6 refills | Status: DC
  Filled 2023-03-29 – 2023-06-14 (×3): qty 18, 47d supply, fill #0
  Filled 2023-08-01: qty 18, 47d supply, fill #1
  Filled 2023-09-12: qty 18, 47d supply, fill #2
  Filled 2023-11-06: qty 18, 47d supply, fill #3
  Filled ????-??-?? (×2): fill #1

## 2023-03-29 MED ORDER — LINACLOTIDE 290 MCG PO CAPS
290.0000 ug | ORAL_CAPSULE | Freq: Every day | ORAL | 1 refills | Status: DC
  Filled 2023-03-29: qty 90, 90d supply, fill #0
  Filled 2023-07-03: qty 90, 90d supply, fill #1

## 2023-03-29 NOTE — Patient Instructions (Signed)
Hacer ejercicio para bajar de peso Exercising to [employer] ejercicio de forma regular es importante para todos. Es especialmente importante si tiene sobrepeso. El sobrepeso aumenta el riesgo de tener enfermedad cardaca, accidente cerebrovascular, diabetes, presin arterial alta y varios tipos de cncer. Hacer ejercicio y reducir las caloras que consume puede ayudarle a Publishing copy de [place] y a Scientist, clinical (histocompatibility and immunogenetics) su estado fsico y Special educational needs teacher. El ejercicio puede ser de intensidad moderada o vigorosa. Para bajar de peso, la [employer] de las personas debe hacer una determinada cantidad de ejercicio de intensidad moderada o vigorosa cada semana. Cmo puede afectarme el ejercicio? Cuando hace suficiente ejercicio como para quemar ms caloras que las que consume, baja de [place]. Tambin reduce la grasa corporal y aumenta la masa muscular. Cuanto ms msculo tenga, mayor cantidad de Veterinary surgeon. El ejercicio tambin: Mejora el estado de nimo. Reduce el estrs y las tensiones. Mejora el estado fsico general, la flexibilidad y la resistencia. Aumenta la fuerza sea. Ejercicio de intensidad moderada  El ejercicio de intensidad moderada es cualquier actividad que lo haga moverse lo suficiente como para quemar al menos tres veces ms energa (caloras) que si estuviera sentado. Algunos ejemplos de ejercicio de intensidad moderada son: Caminar una milla (1,6 kilmetros) en 15 minutos. Hacer trabajos de jardinera livianos. Andar en bicicleta a un ritmo fcil de aguantar. La [employer]son de las personas debe hacer al menos 150 minutos por semana de ejercicio de intensidad moderada para Pharmacologist su Runner, broadcasting/film/video. Ejercicio de intensidad vigorosa El ejercicio de intensidad vigorosa es cualquier actividad que lo haga moverse lo suficiente como para quemar al menos seis veces ms caloras que si estuviera sentado. Al hacer ejercicio a esta intensidad, su nivel de esfuerzo debera ser lo suficientemente alto como para no  permitirle Diplomatic Services operational officer. Algunos ejemplos de ejercicio de intensidad vigorosa son: Environmental consultant. Practicar un deporte de equipo, como ftbol americano, baloncesto y ftbol. Saltar la cuerda. La [employer]son de las personas debe hacer al menos 75 minutos por semana de ejercicio de intensidad vigorosa para Pharmacologist su peso corporal. Qu medidas puedo tomar para bajar de peso? La cantidad de ejercicio que necesita realizar para bajar de peso depende de: Su edad. El tipo de ejercicio. Cualquier afeccin de [employer]. Su capacidad fsica general. Pregntele al mdico cunto ejercicio debe realizar y qu tipos de actividades son seguras para usted. Nutricin  [employer] dieta como se lo haya indicado el mdico o especialista en alimentacin y nutricin (nutricionista). Esto puede incluir: Consumir menos caloras. Consumir ms protenas. Consumir menos grasas no saludables. Seguir una dieta que incluya frutas y verduras frescas, cereales integrales, productos lcteos semidescremados y Associate Professor. Evite los alimentos con grasa, sal y azcar agregadas. Beba gran cantidad de agua mientras hace ejercicio para evitar la deshidratacin o los golpes de Airline pilot. Actividad Elija una actividad que disfrute y establezca objetivos realistas. El mdico puede ayudarlo a Event organiser un plan de ejercicio que funcione para usted. Haga ejercicio a una intensidad moderada o vigorosa la DIRECTV de la [place]. La intensidad de la actividad fsica puede variar de Neomia Dear persona a Educational psychologist. Puede saber qu tan intensa una rutina de ejercicios es para usted al Art therapist atencin a su respiracin y latidos cardacos. La [employer]son de las personas notar que su respiracin y latidos cardacos se aceleran al Education officer, environmental ejercicio de mayor intensidad. Haga entrenamiento de [employer] veces por semana, como: Flexiones de [place]. Abdominales. Levantamiento de pesas. Uso de bandas elsticas de  resistencia. Hacer ejercicio en perodos cortos de tiempo puede ser tan til como en perodos largos y estructurados. Si tiene problemas para Music therapist para Materials engineer, intente hacer estas cosas como parte de su rutina diaria: Sherron Monday, estrese y camine cada 30 minutos a lo largo del Futures trader. Vaya a caminar durante su hora de almuerzo. Estacione el auto lejos de su lugar de destino. Si [place] transporte pblico, bjese una parada antes y camine el resto del camino. Pngase de pie y camine cada vez que hable por telfono. Utilice la Optometrist del ascensor o la Art gallery manager. Use ropa cmoda y calzado con buen soporte. No haga ejercicio en exceso que pudiera hacer que se lastime, se sienta mareado o tenga dificultad para respirar. Dnde buscar ms informacin U.S. Department of Health and [employer] (Departamento de Salud y Servicios Humanos de los [place]): ThisPath.fi Centers for Disease Control and Prevention (Centros para Air traffic controller y la Prevencin de Event organiser): FootballExhibition.com.br Comunquese con un mdico: Antes de comenzar un nuevo programa de ejercicios. Si tiene preguntas o inquietudes acerca de su peso. Si tiene un problema mdico que Musician. Solicite ayuda de inmediato si: Presenta algo de lo siguiente mientras hace ejercicio: Lesiones. Mareos. Dificultad para respirar o falta de aire que no desaparecen al dejar de hacer ejercicio. Dolor en el pecho. Latidos cardacos rpidos. Estos sntomas pueden representar un problema grave que constituye Radio broadcast assistant. No espere a ver si los sntomas desaparecen. Solicite atencin mdica de inmediato. Comunquese con el servicio de emergencias de su localidad (911 en los [place]idos). No conduzca por sus propios medios [facility]. Resumen Hacer ejercicio con regularidad es especialmente importante si tiene sobrepeso. El sobrepeso aumenta el riesgo de tener enfermedad cardaca, accidente  cerebrovascular, diabetes, presin arterial alta y varios tipos de cncer. Para perder peso debe quemar ms caloras que las que consume. Reducir la cantidad de caloras que consume, y Radio producer ejercicio de intensidad moderada o vigorosa todas las [place], [place] a Publishing copy de [place]. Esta informacin no tiene Theme park manager el consejo del mdico. Asegrese de hacerle al mdico cualquier pregunta que tenga. Document Revised: 09/07/2020 Document Reviewed: 09/07/2020 Elsevier Patient Education  2024 [employer].

## 2023-03-29 NOTE — Progress Notes (Signed)
Subjective:  Patient ID: Julie Robertson, female    DOB: 1975-09-06  Age: 47 y.o. MRN: [id number]  CC: Weight Loss Surgery (Form to be completed.)   HPI Julie Robertson is a 47 y.o. year old female with a history of left occipital lobe stroke in 12/2021, PFO, type 2 diabetes mellitus (A1c 6.4), dyslipidemia, GERD, OSA (on CPAP) nicotine dependence.    Interval History: Discussed the use of AI scribe software for clinical note transcription with the patient, who gave verbal consent to proceed.  She presents for a weight loss surgery consultation. She has been making significant efforts to lower her A1c and lose weight, with some success. Her A1c is currently 6.4, indicating good control of her diabetes. She has been exercising regularly, attending the gym 5-6 days a week for approximately two hours each session, focusing on cardio. She has also been working with a nutritionist to improve her diet, focusing on calorie intake. Despite these efforts, she has found it difficult to lose weight and has reached a plateau. She has been trying to lose weight for the past two years. She is currently under the care of bariatric surgery and is being worked up for bariatric surgery procedure and requests a form to be completed by her PCP to return to [facility].  She has been seeing a sleep doctor due to episodes of apnea, and a cardiologist. Both specialists have recommended weight loss surgery. She also reports some high cholesterol and mild anemia, diagnosed at another clinic in [place].    Constipation she is on Linzess but is having to take 2 of her 145 mcg tablets for relief  Past Medical History:  Diagnosis Date   Anxiety    Asthma    CVA (cerebral vascular accident) (HCC)    Diabetes mellitus without complication (HCC)    Diabetic neuropathy (HCC) 12/2013   vs carpal tunnell.  numbness tingling in right fingers. rx with Gabapentin.    Dyslipidemia 08/2009   Dyspnea    Gall stones 08/2009   GERD (gastroesophageal reflux disease)    H/O tubal ligation    Obesity    BMI 41, 250# 03/2015    Past Surgical History:  Procedure Laterality Date   APPLICATION OF WOUND VAC N/A 05/09/2019   Procedure: APPLICATION OF WOUND VAC;  Surgeon: Henrene Dodge, MD;  Location: [facility] ORS;  Service: General;  Laterality: N/A;  [id number]   BUBBLE STUDY  12/24/2021   Procedure: BUBBLE STUDY;  Surgeon: Lewayne Bunting, MD;  Location: [facility] ENDOSCOPY;  Service: Cardiovascular;;   CHOLECYSTECTOMY N/A 03/10/2015   Procedure: LAPAROSCOPIC CHOLECYSTECTOMY WITH INTRAOPERATIVE CHOLANGIOGRAM;  Surgeon: Violeta Gelinas, MD;  Location: [facility] OR;  Service: General;  Laterality: N/A;   CORONARY BALLOON ANGIOPLASTY N/A 08/09/2022   Procedure: CORONARY BALLOON ANGIOPLASTY;  Surgeon: Yates Decamp, MD;  Location: [facility] INVASIVE CV LAB;  Service: Cardiovascular;  Laterality: N/A;   ERCP N/A 03/11/2015   Procedure: ENDOSCOPIC RETROGRADE CHOLANGIOPANCREATOGRAPHY (ERCP);  Surgeon: Rachael Fee, MD;  Location[facility]COPY;  Service: Endoscopy;  Laterality: N/A;   ESOPHAGOGASTRODUODENOSCOPY (EGD) WITH PROPOFOL N/A 07/25/2018   Procedure: ESOPHAGOGASTRODUODENOSCOPY (EGD) WITH PROPOFOL;  Surgeon: Meridee Score Netty Starring., MD;  Location: [facility] ENDOSCOPY;  Service: Gastroenterology;  Laterality: N/A;   INSERTION OF MESH N/A 05/09/2019   Procedure: INSERTION OF MESH;  Surgeon: Henrene Dodge, MD;  Location: [facility] ORS;  Service: General;  Laterality: N/A;   LEFT HEART CATH AND CORONARY ANGIOGRAPHY N/A 08/09/2022   Procedure: LEFT HEART  CATH AND CORONARY ANGIOGRAPHY;  Surgeon: Yates Decamp, MD;  Location: [facility] INVASIVE CV LAB;  Service: Cardiovascular;  Laterality: N/A;   TEE WITHOUT CARDIOVERSION N/A 12/24/2021   Procedure: TRANSESOPHAGEAL ECHOCARDIOGRAM (TEE);  Surgeon: Lewayne Bunting, MD;  Location: [facility] ENDOSCOPY;  Service: Cardiovascular;  Laterality: N/A;   Transduodenal Ampullectomy  09/2015    TUBAL LIGATION     VENTRAL HERNIA REPAIR N/A 05/09/2019   Procedure: HERNIA REPAIR VENTRAL ADULT with MESH;  Surgeon: Henrene Dodge, MD;  Location: [facility] ORS;  Service: General;  Laterality: N/A;    Family History  Adopted: Yes  Problem Relation Age of Onset   Diabetes Mother    Heart disease Mother    Hypertension Mother    Heart attack Father    Diabetes Father    Kidney disease Maternal Uncle    Bone cancer Maternal Grandfather    Other Son        had kidney removed due to gun shot wound    Social History   Socioeconomic History   Marital status: Married    Spouse name: Not on file   Number of children: 2   Years of education: Not on file   Highest education level: Not on file  Occupational History   Occupation: house cleaning  Tobacco Use   Smoking status: Former    Current packs/day: 1.50    Average packs/day: 1.5 packs/day for 4.0 years (6.0 ttl pk-yrs)    Types: Cigarettes   Smokeless tobacco: Never  Vaping Use   Vaping status: Never Used  Substance and Sexual Activity   Alcohol use: Yes    Comment: rare   Drug use: No   Sexual activity: Not on file  Other Topics Concern   Not on file  Social History Narrative   Patient has 2 sons one is 21, the other 3 as of 03/2015. Her kids are not the children of her current husband. As of 03/2015 she is not employed outside the home.   Social Determinants of Health   Financial Resource Strain: Low Risk  (03/22/2023)   Received from [facility]   Overall Financial Resource Strain (CARDIA)    Difficulty of Paying Living Expenses: Not hard at all  Food Insecurity: No Food Insecurity (03/22/2023)   [facility]   Hunger Vital Sign    Worried About Running Out of Food in the Last Year: Never true    Ran Out of Food in the Last Year: Never true  Transportation Needs: No Transportation Needs (03/22/2023)   Received from [facility] - Transportation    Lack of Transportation (Medical): No     Lack of Transportation (Non-Medical): No  Physical Activity: Inactive (09/23/2022)   Exercise Vital Sign    Days of Exercise per Week: 0 days    Minutes of Exercise per Session: 0 min  Stress: No Stress Concern Present (09/23/2022)   [employer] of Occupational Health - Occupational Stress Questionnaire    Feeling of Stress : Only a little  Social Connections: Unknown (03/10/2023)   Received from [facility]   Social Network    Social Network: Not on file    No Known Allergies  Outpatient Medications Prior to Visit  Medication Sig Dispense Refill   albuterol (VENTOLIN HFA) 108 (90 Base) MCG/ACT inhaler Inhale 2 puffs into the lungs every 6 (six) hours as needed for wheezing or shortness of breath. 18 g 1   aspirin 81 MG chewable tablet Chew 1  tablet (81 mg total) by mouth daily. 90 tablet 1   atorvastatin (LIPITOR) 40 MG tablet Take 1 tablet (40 mg total) by mouth at bedtime. 90 tablet 3   blood glucose meter kit and supplies KIT Use up to four times daily as directed. 1 each 11   Blood Glucose Monitoring Suppl (TRUE METRIX METER) w/Device KIT Use as directed 1 kit 0   Blood Glucose Monitoring Suppl (TRUE METRIX METER) w/Device KIT use up to 4 times daily as directed 1 kit 0   Cholecalciferol (VITAMIN D3) 1.25 MG (50000 UT) CAPS Take 1 capsule (1.25 mg total) by mouth once a week. 12 capsule 0   clopidogrel (PLAVIX) 75 MG tablet Take 1 tablet (75 mg total) by mouth daily with breakfast. 90 tablet 1   Continuous Glucose Sensor (FREESTYLE LIBRE 3 SENSOR) MISC Use as directed. 2 each 3   empagliflozin (JARDIANCE) 25 MG TABS tablet Take 1 tablet (25 mg total) by mouth daily before breakfast. 90 tablet 1   fenofibrate (TRICOR) 145 MG tablet Take 1 tablet (145 mg total) by mouth daily. 90 tablet 2   gabapentin (NEURONTIN) 300 MG capsule Take 1 capsule (300 mg total) by mouth 3 (three) times daily. 270 capsule 2   glipiZIDE (GLUCOTROL) 10 MG tablet Take 1 tablet (10 mg total) by mouth 2  (two) times daily. 180 tablet 1   glucose blood (ACCU-CHEK GUIDE) test strip use as directed 100 each 6   glucose blood (TRUE METRIX BLOOD GLUCOSE TEST) test strip Use as instructed 100 each 12   Insulin Pen Needle (TRUEPLUS 5-BEVEL PEN NEEDLES) 32G X 4 MM MISC USE AS DIRECTED 100 each 3   Lancets MISC use as directed 100 each 0   latanoprost (XALATAN) 0.005 % ophthalmic solution Place 1 drop into both eyes every evening. 5 mL 11   loratadine (CLARITIN) 10 MG tablet Take 1 tablet (10 mg total) by mouth daily. 30 tablet 11   metFORMIN (GLUCOPHAGE) 500 MG tablet Take 2 tablets (1,000 mg total) by mouth 2 (two) times daily with a meal. 120 tablet 3   metoprolol succinate (TOPROL XL) 25 MG 24 hr tablet Take 1 tablet (25 mg total) by mouth daily. 90 tablet 1   nitroGLYCERIN (NITROSTAT) 0.4 MG SL tablet Place 1 tablet (0.4 mg total) under the tongue every 5 (five) minutes as needed for chest pain. If you require more than two tablets five minutes apart go to the nearest ER via EMS. 30 tablet 0   pantoprazole (PROTONIX) 40 MG tablet Take 1 tablet (40 mg total) by mouth daily before breakfast. (Stop taking omeprazole). 90 tablet 1   Semaglutide, 2 MG/DOSE, 8 MG/3ML SOPN Inject 2 mg as directed once a week. 3 mL 6   tiZANidine (ZANAFLEX) 4 MG tablet Take 1 tablet (4 mg total) by mouth every 8 (eight) hours as needed for muscle spasms. 60 tablet 1   Insulin Glargine (BASAGLAR KWIKPEN) 100 UNIT/ML Inject 60 Units into the skin daily. 18 mL 2   linaclotide (LINZESS) 145 MCG CAPS capsule Take 1 capsule (145 mcg total) by mouth daily before breakfast. 90 capsule 1   No facility-administered medications prior to visit.     ROS Review of Systems  Constitutional:  Negative for activity change and appetite change.  HENT:  Negative for sinus pressure and sore throat.   Respiratory:  Negative for chest tightness, shortness of breath and wheezing.   Cardiovascular:  Negative for chest pain and palpitations.  Gastrointestinal:  Negative for abdominal distention, abdominal pain and constipation.  Genitourinary: Negative.   Musculoskeletal: Negative.   Psychiatric/Behavioral:  Negative for behavioral problems and dysphoric mood.     Objective:  BP 106/72   Pulse 73   Ht 1' (0.305 m)   Wt 238 lb (108 kg)   SpO2 98%   BMI 1162.03 kg/m      03/29/2023   10:36 AM 03/21/2023    3:41 PM 11/03/2022    3:28 PM  BP/Weight  Systolic BP 106  161  Diastolic BP 72  83  Wt. (Lbs) 238 238.8 250  BMI 1162.03 kg/m2 39.74 kg/m2 41.6 kg/m2      Physical Exam Constitutional:      Appearance: She is well-developed. She is obese.  Cardiovascular:     Rate and Rhythm: Normal rate.     Heart sounds: Normal heart sounds. No murmur heard. Pulmonary:     Effort: Pulmonary effort is normal.     Breath sounds: Normal breath sounds. No wheezing or rales.  Chest:     Chest wall: No tenderness.  Abdominal:     General: Bowel sounds are normal. There is no distension.     Palpations: Abdomen is soft. There is no mass.     Tenderness: There is no abdominal tenderness.  Musculoskeletal:        General: Normal range of motion.     Right lower leg: No edema.     Left lower leg: No edema.  Neurological:     Mental Status: She is alert and oriented to person, place, and time.  Psychiatric:        Mood and Affect: Mood normal.        Latest Ref Rng & Units 08/22/2022    2:10 PM 08/10/2022    5:49 AM 07/21/2022   10:10 AM  CMP  Glucose 70 - 99 mg/dL 096  045  409   BUN 6 - 24 mg/dL 15  11  16    Creatinine 0.57 - 1.00 mg/dL 8.11  9.14  7.82   Sodium 134 - 144 mmol/L 135  130  135   Potassium 3.5 - 5.2 mmol/L 4.2  3.7  4.3   Chloride 96 - 106 mmol/L 100  99  101   CO2 20 - 29 mmol/L 21  23  22    Calcium 8.7 - 10.2 mg/dL 9.2  8.7  9.5   Total Protein 6.0 - 8.5 g/dL 7.6   7.6   Total Bilirubin 0.0 - 1.2 mg/dL 0.3   0.3   Alkaline Phos 44 - 121 IU/L 80   88   AST 0 - 40 IU/L 107   92   ALT 0 - 32 IU/L  104   105     Lipid Panel     Component Value Date/Time   CHOL 126 07/21/2022 1010   TRIG 218 (H) 07/21/2022 1010   HDL 26 (L) 07/21/2022 1010   CHOLHDL 6.0 12/21/2021 0925   VLDL 45 (H) 12/21/2021 0925   LDLCALC 64 07/21/2022 1010   LDLDIRECT 64 07/21/2022 1010    CBC    Component Value Date/Time   WBC 5.4 08/10/2022 0549   RBC 4.50 08/10/2022 0549   HGB 9.6 (L) 08/10/2022 0549   HGB 10.5 (L) 07/21/2022 1010   HCT 32.0 (L) 08/10/2022 0549   HCT 37.3 07/21/2022 1010   PLT 350 08/10/2022 0549   PLT 404 07/21/2022 1010   MCV 71.1 (L) 08/10/2022 0549  MCV 74 (L) 07/21/2022 1010   MCH 21.3 (L) 08/10/2022 0549   MCHC 30.0 08/10/2022 0549   RDW 15.4 08/10/2022 0549   RDW 14.9 07/21/2022 1010   LYMPHSABS 3.5 12/20/2021 1616   LYMPHSABS 2.1 08/30/2021 1115   MONOABS 0.5 12/20/2021 1616   EOSABS 0.4 12/20/2021 1616   EOSABS 0.4 08/30/2021 1115   BASOSABS 0.0 12/20/2021 1616   BASOSABS 0.0 08/30/2021 1115    Lab Results  Component Value Date   HGBA1C 6.4 03/21/2023    Assessment & Plan:      Severe obesity Patient is seeking bariatric surgery. She has been making significant efforts to lose weight through diet and exercise, and has seen a nutritionist. She has also lost weight and lowered her A1c to 6.4. -Complete form for weight loss clinic. -Continue current weight loss efforts.  Type 2 Diabetes Mellitus Well controlled with an A1c of 6.4. Patient is on Jardiance, Glipizide, and basal insulin at night (38-40 units). -Continue current medications. -Check blood glucose levels regularly. -Counseled on Diabetic diet, my plate method, 308 minutes of moderate intensity exercise/week Blood sugar logs with fasting goals of 80-120 mg/dl, random of less than 657 and in the event of sugars less than 60 mg/dl or greater than 846 mg/dl encouraged to notify the clinic. Advised on the need for annual eye exams, annual foot exams, Pneumonia vaccine.   Constipation Patient  reports needing to take Linzess twice daily for relief. -Increase Linzess from 145 mcg 2 to 90 mcg  Hyperlipidemia associated with type 2 diabetes mellitus Patient reports recent cholesterol testing at another clinic,-Order lipid panel today. -Continue statin -Continue low-cholesterol diet  Hypertension -Controlled -Continue current antihypertensive -Counseled on blood pressure goal of less than 130/80, low-sodium, DASH diet, medication compliance, 150 minutes of moderate intensity exercise per week. Discussed medication compliance, adverse effects.  General Health Maintenance / Followup Plans -Print out medication list for patient. -Order blood tests today since patient is fasting.          Meds ordered this encounter  Medications   Insulin Glargine (BASAGLAR KWIKPEN) 100 UNIT/ML    Sig: Inject 38 Units into the skin daily.    Dispense:  18 mL    Refill:  6   linaclotide (LINZESS) 290 MCG CAPS capsule    Sig: Take 1 capsule (290 mcg total) by mouth daily before breakfast.    Dispense:  90 capsule    Refill:  1    Dose increase    Follow-up: Return in about 6 months (around 09/26/2023) for Chronic medical conditions.       Hoy Register, MD, FAAFP. [facility] [place], [place] [phone]   03/29/2023, 12:06 PM

## 2023-03-30 ENCOUNTER — Ambulatory Visit: Payer: Medicaid Other | Admitting: Family Medicine

## 2023-03-30 ENCOUNTER — Other Ambulatory Visit: Payer: Self-pay

## 2023-03-30 LAB — LP+NON-HDL CHOLESTEROL
Cholesterol, Total: 120 mg/dL (ref 100–199)
HDL: 33 mg/dL — ABNORMAL LOW (ref >39)
LDL Chol Calc (NIH): 64 mg/dL (ref 0–99)
Total Non-HDL-Chol (LDL+VLDL): 87 mg/dL (ref 0–129)
Triglycerides: 127 mg/dL (ref 0–149)
VLDL Cholesterol Cal: 23 mg/dL (ref 5–40)

## 2023-03-30 LAB — CMP14+EGFR
ALT: 39 IU/L — ABNORMAL HIGH (ref 0–32)
AST: 44 IU/L — ABNORMAL HIGH (ref 0–40)
Albumin: 4.2 g/dL (ref 3.9–4.9)
Alkaline Phosphatase: 55 IU/L (ref 44–121)
BUN/Creatinine Ratio: 19 (ref 9–23)
BUN: 18 mg/dL (ref 6–24)
Bilirubin Total: 0.3 mg/dL (ref 0.0–1.2)
CO2: 22 mmol/L (ref 20–29)
Calcium: 9.6 mg/dL (ref 8.7–10.2)
Chloride: 104 mmol/L (ref 96–106)
Creatinine, Ser: 0.94 mg/dL (ref 0.57–1.00)
Globulin, Total: 3.5 g/dL (ref 1.5–4.5)
Glucose: 139 mg/dL — ABNORMAL HIGH (ref 70–99)
Potassium: 4.4 mmol/L (ref 3.5–5.2)
Sodium: 139 mmol/L (ref 134–144)
Total Protein: 7.7 g/dL (ref 6.0–8.5)
eGFR: 75 mL/min/{1.73_m2} (ref >59)

## 2023-03-30 LAB — CBC WITH DIFFERENTIAL/PLATELET
Basophils Absolute: 0 10*3/uL (ref 0.0–0.2)
Basos: 0 %
EOS (ABSOLUTE): 0.3 10*3/uL (ref 0.0–0.4)
Eos: 3 %
Hematocrit: 40.1 % (ref 34.0–46.6)
Hemoglobin: 11.4 g/dL (ref 11.1–15.9)
Immature Grans (Abs): 0 10*3/uL (ref 0.0–0.1)
Immature Granulocytes: 0 %
Lymphocytes Absolute: 2.2 10*3/uL (ref 0.7–3.1)
Lymphs: 28 %
MCH: 22.1 pg — ABNORMAL LOW (ref 26.6–33.0)
MCHC: 28.4 g/dL — ABNORMAL LOW (ref 31.5–35.7)
MCV: 78 fL — ABNORMAL LOW (ref 79–97)
Monocytes Absolute: 0.7 10*3/uL (ref 0.1–0.9)
Monocytes: 8 %
Neutrophils Absolute: 4.8 10*3/uL (ref 1.4–7.0)
Neutrophils: 61 %
Platelets: 382 10*3/uL (ref 150–450)
RBC: 5.17 x10E6/uL (ref 3.77–5.28)
RDW: 15.8 % — ABNORMAL HIGH (ref 11.7–15.4)
WBC: 8 10*3/uL (ref 3.4–10.8)

## 2023-03-31 ENCOUNTER — Other Ambulatory Visit: Payer: Self-pay

## 2023-04-03 ENCOUNTER — Other Ambulatory Visit: Payer: Self-pay

## 2023-04-03 MED ORDER — FREESTYLE LIBRE 3 SENSOR MISC
3 refills | Status: DC
  Filled 2023-04-03: qty 2, 23d supply, fill #0
  Filled 2023-06-14 – 2023-06-22 (×2): qty 2, 28d supply, fill #0
  Filled 2023-07-17 (×2): qty 2, 28d supply, fill #1
  Filled 2023-08-21: qty 2, 28d supply, fill #2
  Filled 2023-09-07 – 2023-09-26 (×3): qty 2, 28d supply, fill #3

## 2023-04-03 MED ORDER — MOUNJARO 10 MG/0.5ML ~~LOC~~ SOAJ
10.0000 mg | SUBCUTANEOUS | 0 refills | Status: DC
  Filled 2023-04-03 – 2023-04-04 (×2): qty 2, 28d supply, fill #0

## 2023-04-04 ENCOUNTER — Other Ambulatory Visit: Payer: Self-pay

## 2023-04-12 ENCOUNTER — Encounter: Payer: Self-pay | Admitting: Family

## 2023-04-12 ENCOUNTER — Other Ambulatory Visit: Payer: Self-pay

## 2023-04-12 ENCOUNTER — Ambulatory Visit: Payer: Medicaid Other | Admitting: Family

## 2023-04-12 DIAGNOSIS — G8911 Acute pain due to trauma: Secondary | ICD-10-CM | POA: Diagnosis not present

## 2023-04-12 DIAGNOSIS — M25512 Pain in left shoulder: Secondary | ICD-10-CM | POA: Diagnosis not present

## 2023-04-12 MED ORDER — NABUMETONE 500 MG PO TABS
500.0000 mg | ORAL_TABLET | Freq: Every day | ORAL | 0 refills | Status: DC
Start: 2023-04-12 — End: 2023-08-15
  Filled 2023-04-12: qty 30, 30d supply, fill #0
  Filled 2023-05-02 – 2023-05-11 (×2): qty 30, 30d supply, fill #1
  Filled 2023-06-06 – 2023-06-14 (×3): qty 30, 30d supply, fill #2

## 2023-04-12 NOTE — Progress Notes (Signed)
Office Visit Note   Patient: Julie Robertson           Date of Birth: 08/10/1975           MRN: [id number] Visit Date: 04/12/2023              Requested by: Hoy Register, MD [address] [place],  [place] [address] PCP: Hoy Register, MD  No chief complaint on file.     HPI: The patient is a 47 year old woman who is seen in follow-up and for MRI review of the left shoulder.  She several months ago was lifting when she felt something pop in her left shoulder she has had difficulty with range of motion especially forward reaching above head reaching some weakness which has been gradually worsening in her left upper extremity.  She has been doing a home exercise program without any improvement in her strength or pain.  She did have a Depo-Medrol injection several months ago which provided modest relief for about a week.  She is currently awaiting bariatric surgery and would like to hold off on any further cortisone injections at this time  Continues to have night pain difficulty getting comfortable in bed aching throbbing shooting pain in the upper arm and shoulder no neck pain no numbness  Assessment & Plan: Visit Diagnoses: No diagnosis found.  Plan: Tendinosis with tiny insertional interstitial tear of the supraspinatus.  Discussed conservative measures.  She will be set up for physical therapy.  She will proceed with her bariatric surgery will follow-up with her after her surgery and her therapy  Follow-Up Instructions: No follow-ups on file.   Left Shoulder Exam   Tenderness  The patient is experiencing tenderness in the biceps tendon.  Range of Motion  The patient has normal left shoulder ROM.  Muscle Strength  Biceps: 3/5   Tests  Impingement: positive Drop arm: negative  Other  Erythema: absent Pulse: present       Patient is alert, oriented, no adenopathy, well-dressed, normal affect, normal respiratory  effort.   Imaging: No results found. No images are attached to the encounter.    IMPRESSION: 1. Mild tendinosis of the supraspinatus tendon with a tiny insertional interstitial tear. 2. Mild tendinosis of the infraspinatus and subscapularis tendons  Labs: Lab Results  Component Value Date   HGBA1C 6.4 03/21/2023   HGBA1C 7.7 (A) 11/14/2022   HGBA1C 11.1 (H) 08/10/2022   REPTSTATUS 04/16/2010 FINAL 04/15/2010   CULT  04/15/2010    Multiple bacterial morphotypes present, none predominant. Suggest appropriate recollection if clinically indicated.     Lab Results  Component Value Date   ALBUMIN 4.2 03/29/2023   ALBUMIN 3.9 08/22/2022   ALBUMIN 3.9 07/21/2022    Lab Results  Component Value Date   MG 1.9 05/10/2019   No results found for: "VD25OH"  No results found for: "PREALBUMIN"    Latest Ref Rng & Units 03/29/2023   11:34 AM 08/10/2022    5:49 AM 07/21/2022   10:10 AM  CBC EXTENDED  WBC 3.4 - 10.8 x10E3/uL 8.0  5.4  6.2   RBC 3.77 - 5.28 x10E6/uL 5.17  4.50  5.07   Hemoglobin 11.1 - 15.9 g/dL 95.2  9.6  84.1   HCT 32.4 - 46.6 % 40.1  32.0  37.3   Platelets 150 - 450 x10E3/uL 382  350  404   NEUT# 1.4 - 7.0 x10E3/uL 4.8     Lymph# 0.7 - 3.1 x10E3/uL 2.2  There is no height or weight on file to calculate BMI.  Orders:  No orders of the defined types were placed in this encounter.  Meds ordered this encounter  Medications   nabumetone (RELAFEN) 500 MG tablet    Sig: Take 1 tablet (500 mg total) by mouth daily.    Dispense:  90 tablet    Refill:  0     Procedures: No procedures performed  Clinical Data: No additional findings.  ROS:  All other systems negative, except as noted in the HPI. Review of Systems  Objective: Vital Signs: There were no vitals taken for this visit.  Specialty Comments:  No specialty comments available.  PMFS History: Patient Active Problem List   Diagnosis Date Noted   Atherosclerosis of native coronary  artery of native heart with stable angina pectoris (HCC) 08/10/2022   Mixed hyperlipidemia 08/10/2022   Hypertriglyceridemia 08/10/2022   History of stroke 08/10/2022   Class 3 severe obesity due to excess calories with serious comorbidity and body mass index (BMI) of 40.0 to 44.9 in adult [facility]) 08/10/2022   PFO (patent foramen ovale) 08/10/2022   Post PTCA 08/09/2022   PFO with atrial septal aneurysm 12/24/2021   Acute ischemic stroke (HCC) 12/21/2021   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   Acute pulmonary edema (HCC) 09/06/2020   Incisional hernia, without obstruction or gangrene 05/09/2019   Uncontrolled type 2 diabetes mellitus with hyperglycemia, with long-term current use of insulin (HCC) 04/12/2017   Hypertension associated with diabetes (HCC) 04/12/2017   Morbid (severe) obesity due to excess calories (HCC) 10/18/2016   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   Iron deficiency anemia 09/02/2015   Asthma 08/28/2015   Type 2 diabetes mellitus (HCC) 07/31/2015   Dyslipidemia 12/04/2013   Numbness and tingling in right hand 12/04/2013   Smoking 12/04/2013   Elevated BP 12/04/2013   Pap smear, high-risk (screening, no prior abnormality) 02/13/2013   Exposure to STD 02/13/2013   Past Medical History:  Diagnosis Date   Anxiety    Asthma    CVA (cerebral vascular accident) (HCC)    Diabetes mellitus without complication (HCC)    Diabetic neuropathy (HCC) 12/2013   vs carpal tunnell.  numbness tingling in right fingers. rx with Gabapentin.   Dyslipidemia 08/2009   Dyspnea    Gall stones 08/2009   GERD (gastroesophageal reflux disease)    H/O tubal ligation    Obesity    BMI 41, 250# 03/2015    Family History  Adopted: Yes  Problem Relation Age of Onset   Diabetes Mother    Heart disease Mother    Hypertension Mother    Heart attack Father    Diabetes Father    Kidney disease Maternal Uncle    Bone cancer Maternal Grandfather    Other Son        had kidney removed  due to gun shot wound    Past Surgical History:  Procedure Laterality Date   APPLICATION OF WOUND VAC N/A 05/09/2019   Procedure: APPLICATION OF WOUND VAC;  Surgeon: Henrene Dodge, MD;  Location: [facility] ORS;  Service: General;  LateralityGretta Cool;  [id number]   BUBBLE STUDY  12/24/2021   Procedure: BUBBLE STUDY;  Surgeon: Lewayne Bunting, MD;  Location: [facility] ENDOSCOPY;  Service: Cardiovascular;;   CHOLECYSTECTOMY N/A 03/10/2015   Procedure: LAPAROSCOPIC CHOLECYSTECTOMY WITH INTRAOPERATIVE CHOLANGIOGRAM;  Surgeon: Violeta Gelinas, MD;  Location: [facility] OR;  Service: General;  Laterality: N/A;   CORONARY BALLOON ANGIOPLASTY N/A 08/09/2022  Procedure: CORONARY BALLOON ANGIOPLASTY;  Surgeon: Yates Decamp, MD;  Location: [facility] INVASIVE CV LAB;  Service: Cardiovascular;  Laterality: N/A;   ERCP N/A 03/11/2015   Procedure: ENDOSCOPIC RETROGRADE CHOLANGIOPANCREATOGRAPHY (ERCP);  Surgeon: Rachael Fee, MD;  Location: [facility] ENDOSCOPY;  Service: Endoscopy;  Laterality: N/A;   ESOPHAGOGASTRODUODENOSCOPY (EGD) WITH PROPOFOL N/A 07/25/2018   Procedure: ESOPHAGOGASTRODUODENOSCOPY (EGD) WITH PROPOFOL;  Surgeon: Meridee Score Netty Starring., MD;  Location: [facility] ENDOSCOPY;  Service: Gastroenterology;  Laterality: N/A;   INSERTION OF MESH N/A 05/09/2019   Procedure: INSERTION OF MESH;  Surgeon: Henrene Dodge, MD;  Location: [facility] ORS;  Service: General;  Laterality: N/A;   LEFT HEART CATH AND CORONARY ANGIOGRAPHY N/A 08/09/2022   Procedure: LEFT HEART CATH AND CORONARY ANGIOGRAPHY;  Surgeon: Yates Decamp, MD;  [facility] INVASIVE CV LAB;  Service: Cardiovascular;  Laterality: N/A;   TEE WITHOUT CARDIOVERSION N/A 12/24/2021   Procedure: TRANSESOPHAGEAL ECHOCARDIOGRAM (TEE);  Surgeon: Lewayne Bunting, MD;  Location: [facility] ENDOSCOPY;  Service: Cardiovascular;  Laterality: N/A;   Transduodenal Ampullectomy  09/2015   TUBAL LIGATION     VENTRAL HERNIA REPAIR N/A 05/09/2019   Procedure: HERNIA REPAIR VENTRAL ADULT with MESH;  Surgeon: Henrene Dodge, MD;   Location: [facility] ORS;  Service: General;  Laterality: N/A;   Social History   Occupational History   Occupation: house cleaning  Tobacco Use   Smoking status: Former    Current packs/day: 1.50    Average packs/day: 1.5 packs/day for 4.0 years (6.0 ttl pk-yrs)    Types: Cigarettes   Smokeless tobacco: Never  Vaping Use   Vaping status: Never Used  Substance and Sexual Activity   Alcohol use: Yes    Comment: rare   Drug use: No   Sexual activity: Not on file

## 2023-04-13 ENCOUNTER — Other Ambulatory Visit: Payer: Self-pay | Admitting: Family Medicine

## 2023-04-13 ENCOUNTER — Other Ambulatory Visit: Payer: Self-pay

## 2023-04-13 MED ORDER — EMPAGLIFLOZIN 25 MG PO TABS
25.0000 mg | ORAL_TABLET | Freq: Every day | ORAL | 1 refills | Status: DC
  Filled 2023-04-13: qty 90, 90d supply, fill #0
  Filled 2023-07-10 – 2023-07-11 (×2): qty 90, 90d supply, fill #1

## 2023-04-13 MED ORDER — ACCU-CHEK GUIDE VI STRP
ORAL_STRIP | 6 refills | Status: DC
Start: 2023-04-13 — End: 2023-06-06
  Filled 2023-04-13 – 2023-04-18 (×3): qty 100, 25d supply, fill #0

## 2023-04-14 ENCOUNTER — Encounter: Payer: Self-pay | Admitting: Family

## 2023-04-17 ENCOUNTER — Other Ambulatory Visit: Payer: Self-pay

## 2023-04-18 ENCOUNTER — Other Ambulatory Visit: Payer: Self-pay

## 2023-04-19 ENCOUNTER — Other Ambulatory Visit: Payer: Self-pay

## 2023-04-26 ENCOUNTER — Other Ambulatory Visit: Payer: Self-pay

## 2023-04-27 ENCOUNTER — Other Ambulatory Visit: Payer: Self-pay

## 2023-04-28 ENCOUNTER — Other Ambulatory Visit: Payer: Self-pay

## 2023-05-01 ENCOUNTER — Other Ambulatory Visit: Payer: Self-pay

## 2023-05-01 ENCOUNTER — Ambulatory Visit: Payer: Self-pay | Admitting: Nurse Practitioner

## 2023-05-01 ENCOUNTER — Other Ambulatory Visit: Payer: Self-pay | Admitting: Family Medicine

## 2023-05-01 DIAGNOSIS — Z794 Long term (current) use of insulin: Secondary | ICD-10-CM

## 2023-05-01 MED ORDER — GLIPIZIDE 10 MG PO TABS
10.0000 mg | ORAL_TABLET | Freq: Two times a day (BID) | ORAL | 1 refills | Status: DC
  Filled 2023-05-01: qty 180, 90d supply, fill #0
  Filled 2023-08-01: qty 180, 90d supply, fill #1

## 2023-05-02 ENCOUNTER — Other Ambulatory Visit: Payer: Self-pay

## 2023-05-02 ENCOUNTER — Ambulatory Visit: Payer: Medicaid Other | Attending: Family

## 2023-05-02 DIAGNOSIS — G8929 Other chronic pain: Secondary | ICD-10-CM | POA: Diagnosis present

## 2023-05-02 DIAGNOSIS — M6281 Muscle weakness (generalized): Secondary | ICD-10-CM | POA: Insufficient documentation

## 2023-05-02 DIAGNOSIS — G8911 Acute pain due to trauma: Secondary | ICD-10-CM | POA: Diagnosis not present

## 2023-05-02 DIAGNOSIS — M25512 Pain in left shoulder: Secondary | ICD-10-CM | POA: Diagnosis present

## 2023-05-02 DIAGNOSIS — R293 Abnormal posture: Secondary | ICD-10-CM | POA: Insufficient documentation

## 2023-05-02 NOTE — Therapy (Signed)
OUTPATIENT PHYSICAL THERAPY SHOULDER EVALUATION   Patient Name: Julie Robertson MRN: [id number] DOB:1975/10/12, 47 y.o., female Today's Date: 05/03/2023  END OF SESSION:  PT End of Session - 05/03/23 0841     Visit Number 1    Number of Visits 9    Date for PT Re-Evaluation 06/28/23    Authorization Type [place] [employer]    PT Start Time 1457   arrived late   PT Stop Time 1532    PT Time Calculation (min) 35 min    Activity Tolerance Patient limited by pain    Behavior During Therapy [facility] for tasks assessed/performed             Past Medical History:  Diagnosis Date   Anxiety    Asthma    CVA (cerebral vascular accident) (HCC)    Diabetes mellitus without complication (HCC)    Diabetic neuropathy (HCC) 12/2013   vs carpal tunnell.  numbness tingling in right fingers. rx with Gabapentin.   Dyslipidemia 08/2009   Dyspnea    Gall stones 08/2009   GERD (gastroesophageal reflux disease)    H/O tubal ligation    Obesity    BMI 41, 250# 03/2015   Past Surgical History:  Procedure Laterality Date   APPLICATION OF WOUND VAC N/A 05/09/2019   Procedure: APPLICATION OF WOUND VAC;  Surgeon: Henrene Dodge, MD;  Location: [facility] ORS;  Service: General;  Laterality: N/A;  [id number]   BUBBLE STUDY  12/24/2021   Procedure: BUBBLE STUDY;  Surgeon: Lewayne Bunting, MD;  Location: [facility] ENDOSCOPY;  Service: Cardiovascular;;   CHOLECYSTECTOMY N/A 03/10/2015   Procedure: LAPAROSCOPIC CHOLECYSTECTOMY WITH INTRAOPERATIVE CHOLANGIOGRAM;  Surgeon: Violeta Gelinas, MD;  Location: [facility] OR;  Service: General;  Laterality: N/A;   CORONARY BALLOON ANGIOPLASTY N/A 08/09/2022   Procedure: CORONARY BALLOON ANGIOPLASTY;  Surgeon: Yates Decamp, MD;  [facility] INVASIVE CV LAB;  Service: Cardiovascular;  Laterality: N/A;   ERCP N/A 03/11/2015   Procedure: ENDOSCOPIC RETROGRADE CHOLANGIOPANCREATOGRAPHY (ERCP);  Surgeon: Rachael Fee, MD;  Location: [facility] ENDOSCOPY;  Service: Endoscopy;  Laterality: N/A;    ESOPHAGOGASTRODUODENOSCOPY (EGD) WITH PROPOFOL N/A 07/25/2018   Procedure: ESOPHAGOGASTRODUODENOSCOPY (EGD) WITH PROPOFOL;  Surgeon: Meridee Score Netty Starring., MD;  Location: [facility] ENDOSCOPY;  Service: Gastroenterology;  Laterality: N/A;   INSERTION OF MESH N/A 05/09/2019   Procedure: INSERTION OF MESH;  Surgeon: Henrene Dodge, MD;  Location: [facility] ORS;  Service: General;  Laterality: N/A;   LEFT HEART CATH AND CORONARY ANGIOGRAPHY N/A 08/09/2022   Procedure: LEFT HEART CATH AND CORONARY ANGIOGRAPHY;  Surgeon: Yates Decamp, MD;  Location: [facility] INVASIVE CV LAB;  Service: Cardiovascular;  Laterality: N/A;   TEE WITHOUT CARDIOVERSION N/A 12/24/2021   Procedure: TRANSESOPHAGEAL ECHOCARDIOGRAM (TEE);  Surgeon: Lewayne Bunting, MD;  Location: [facility] ENDOSCOPY;  Service: Cardiovascular;  Laterality: N/A;   Transduodenal Ampullectomy  09/2015   TUBAL LIGATION     VENTRAL HERNIA REPAIR N/A 05/09/2019   Procedure: HERNIA REPAIR VENTRAL ADULT with MESH;  Surgeon: Henrene Dodge, MD;  Location: [facility] ORS;  Service: General;  Laterality: N/A;   Patient Active Problem List   Diagnosis Date Noted   Atherosclerosis of native coronary artery of native heart with stable angina pectoris (HCC) 08/10/2022   Mixed hyperlipidemia 08/10/2022   Hypertriglyceridemia 08/10/2022   History of stroke 08/10/2022   Class 3 severe obesity due to excess calories with serious comorbidity and body mass index (BMI) of 40.0 to 44.9 in adult [facility]) 08/10/2022   PFO (patent foramen ovale) 08/10/2022  Post PTCA 08/09/2022   PFO with atrial septal aneurysm 12/24/2021   Acute ischemic stroke (HCC) 12/21/2021   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   Acute pulmonary edema (HCC) 09/06/2020   Incisional hernia, without obstruction or gangrene 05/09/2019   Uncontrolled type 2 diabetes mellitus with hyperglycemia, with long-term current use of insulin (HCC) 04/12/2017   Hypertension associated with diabetes (HCC) 04/12/2017   Morbid (severe)  obesity due to excess calories (HCC) 10/18/2016   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   Iron deficiency anemia 09/02/2015   Asthma 08/28/2015   Type 2 diabetes mellitus (HCC) 07/31/2015   Dyslipidemia 12/04/2013   Numbness and tingling in right hand 12/04/2013   Smoking 12/04/2013   Elevated BP 12/04/2013   Pap smear, high-risk (screening, no prior abnormality) 02/13/2013   Exposure to STD 02/13/2013    PCP: Hoy Register, MD  REFERRING PROVIDER: Adonis Huguenin, NP   REFERRING DIAG: [phone] (ICD-10-CM) - Acute pain of left shoulder due to trauma   THERAPY DIAG:  Chronic left shoulder pain - Plan: PT plan of care cert/re-cert  Muscle weakness (generalized) - Plan: PT plan of care cert/re-cert  Abnormal posture - Plan: PT plan of care cert/re-cert  Rationale for Evaluation and Treatment: Rehabilitation  ONSET DATE: Chronic  SUBJECTIVE:                                                                                                                                                                                      SUBJECTIVE STATEMENT: Pt presents to PT with reports of chronic L shoulder pain. Denies trauma or MOI, notes that she woke up and felt this pain. Orthopedics noted pt with lifting injury several months ago and feeling a "pop" in L shoulder. Has some instances of N/T in L UE, she is concerned that this was because of previous CVA, however, these symptoms are fairly new. She wants to have bariatric surgery, is waiting to see if she is an approved candidate.   Hand dominance: Right  PERTINENT HISTORY: CVA, DMII, HTN  PAIN:  Are you having pain?  Yes: NPRS scale: 7/10 Worst: 10/10 Pain location: L shoulder Pain description: sharp Aggravating factors: OH reaching, lifting, laying on L side Relieving factors: rest, medication  PRECAUTIONS: None  RED FLAGS: None   WEIGHT BEARING RESTRICTIONS: No  FALLS:  Has patient fallen in last 6 months? No  LIVING  ENVIRONMENT: Lives with: lives with their family Lives in: House/apartment  OCCUPATION: Not currently working  PLOF: Independent  PATIENT GOALS: decrease her shoulder pain, improve her comfort with dressing and other ADLs  OBJECTIVE:  Note: Objective measures were completed at Evaluation unless otherwise  noted.  DIAGNOSTIC FINDINGS:  See imaging for MRI results  PATIENT SURVEYS:  FOTO: deferred due to time  COGNITION: Overall cognitive status: Within functional limits for tasks assessed     SENSATION: WFL  POSTURE: Rounded shoulders, fwd head  UPPER EXTREMITY ROM:   Active ROM Right eval Left eval  Shoulder flexion WFL 105  Shoulder extension    Shoulder abduction WFL 80  Shoulder adduction    Shoulder internal rotation T8 L4  Shoulder external rotation    Elbow flexion    Elbow extension    Wrist flexion    Wrist extension    Wrist ulnar deviation    Wrist radial deviation    Wrist pronation    Wrist supination    (Blank rows = not tested)  UPPER EXTREMITY MMT:  MMT Right eval Left eval  Shoulder flexion    Shoulder extension    Shoulder abduction    Shoulder adduction    Shoulder internal rotation 5 3+  Shoulder external rotation 5 3 with pain  Middle trapezius    Lower trapezius    Elbow flexion    Elbow extension    Wrist flexion    Wrist extension    Wrist ulnar deviation    Wrist radial deviation    Wrist pronation    Wrist supination    Grip strength (lbs)    (Blank rows = not tested)  SHOULDER SPECIAL TESTS: Impingement tests: Painful arc test: positive  SLAP lesions: DNT Instability tests: DNT Rotator cuff assessment: DNT Biceps assessment: DNT  JOINT MOBILITY TESTING:  GH hypomobility L  PALPATION:  TTP to L infraspinatus, L upper   TREATMENT: [facility] Adult PT Treatment:                                                DATE: 05/02/2023 Therapeutic Exercise: L upper trap stretch x 30"  Seated scapular retraction x 5  L  shoulder IR/ER isometric x 5 - 50% force  PATIENT EDUCATION: Education details: eval findings, FOTO, HEP, POC Person educated: Patient Education method: Explanation, Demonstration, and Handouts Education comprehension: verbalized understanding and returned demonstration  HOME EXERCISE PROGRAM: Access Code: [email or web address] URL: [email or web address] Date: 05/02/2023 Prepared by: Edwinna Areola  Exercises - Seated Upper Trapezius Stretch (Mirrored)  - 1 x daily - 7 x weekly - 2 reps - 30 sec hold - Seated Scapular Retraction  - 1 x daily - 7 x weekly - 2 sets - 10 reps - 3 sec hold - Standing Isometric Shoulder Internal Rotation with Towel Roll at Doorway  - 1 x daily - 7 x weekly - 2 sets - 10 reps - 3 sec hold - Standing Isometric Shoulder External Rotation with Doorway and Towel Roll  - 1 x daily - 7 x weekly - 2 sets - 10 reps - 3 sec hold  ASSESSMENT:  CLINICAL IMPRESSION: Patient is a 47 y.o. F who was seen today for physical therapy evaluation and treatment for chronic L shoulder pain. Physical findings are consistent with orthopedics referral and MRI results as pt demonstrates decrease in L shoulder ROM and RTC muscle weakness. She is operating well below PLOF for objective measures and has significant difficulty completing home ADLs. Pt would benefit from skilled PT services working on improving L periscapular and RTC strength in order to decrease pain and improve function.  OBJECTIVE IMPAIRMENTS: decreased activity tolerance, decreased endurance, decreased mobility, decreased ROM, decreased strength, impaired UE functional use, and pain.   ACTIVITY LIMITATIONS: carrying, lifting, bathing, dressing, and reach over head  PARTICIPATION LIMITATIONS: meal prep, cleaning, driving, shopping, community activity, and yard work  PERSONAL FACTORS: Time since onset of injury/illness/exacerbation and 3+ comorbidities: CVA, DMII, HTN  are also affecting patient's functional outcome.    REHAB POTENTIAL: Good  CLINICAL DECISION MAKING: Evolving/moderate complexity  EVALUATION COMPLEXITY: Moderate   GOALS: Goals reviewed with patient? No  SHORT TERM GOALS: Target date: 05/23/2023   Pt will be compliant and knowledgeable with initial HEP for improved comfort and carryover Baseline: initial HEP given   Goal status: INITIAL  2.  Pt will self report left shoulder pain no greater than 7/10 for improved comfort and functional ability Baseline: 10/10 at worst Goal status: INITIAL   LONG TERM GOALS: Target date: 06/28/2023   Pt will improve FOTO function score to no less than predicted value as proxy for functional improvement Baseline: deferred due to time Goal status: INITIAL   2.  Pt will self report left shoulder pain no greater than 3/10 for improved comfort and functional ability Baseline: 10/10 at worst Goal status: INITIAL   3.  Pt will improve L shoulder flexion to no less than 140 degrees for improved functional ability with OH reaching into cabinets and other home ADLs Baseline: 105 Goal status: INITIAL  4.  Pt will improve L shoulder IR/ER strength through MMT to no less than 4/5 for improved comfort and dynamic stability with OH reaching Baseline: see MMT chart Goal status: INITIAL  PLAN:  PT FREQUENCY: 1x/week  PT DURATION: 8 weeks  PLANNED INTERVENTIONS: 97164- PT Re-evaluation, 97110-Therapeutic exercises, 97530- Therapeutic activity, 97112- Neuromuscular re-education, 97535- Self Care, 86578- Manual therapy, 97014- Electrical stimulation (unattended), [id number]- Electrical stimulation (manual), Dry Needling, Cryotherapy, and Moist heat  PLAN FOR NEXT SESSION: take FOTO, assess HEP response, progressive periscapular/RTC strenghtening   Eloy End, PT 05/03/2023, 9:03 AM

## 2023-05-04 DIAGNOSIS — K5909 Other constipation: Secondary | ICD-10-CM | POA: Insufficient documentation

## 2023-05-08 ENCOUNTER — Ambulatory Visit: Payer: Medicaid Other | Admitting: Cardiology

## 2023-05-09 ENCOUNTER — Other Ambulatory Visit: Payer: Self-pay | Admitting: Cardiology

## 2023-05-09 ENCOUNTER — Other Ambulatory Visit: Payer: Self-pay

## 2023-05-09 ENCOUNTER — Ambulatory Visit: Payer: Medicaid Other | Attending: Cardiology | Admitting: Cardiology

## 2023-05-09 ENCOUNTER — Encounter: Payer: Self-pay | Admitting: Cardiology

## 2023-05-09 VITALS — BP 100/72 | HR 73 | Resp 16 | Ht 64.0 in | Wt 232.0 lb

## 2023-05-09 DIAGNOSIS — I253 Aneurysm of heart: Secondary | ICD-10-CM

## 2023-05-09 DIAGNOSIS — Q2112 Patent foramen ovale: Secondary | ICD-10-CM

## 2023-05-09 DIAGNOSIS — I2510 Atherosclerotic heart disease of native coronary artery without angina pectoris: Secondary | ICD-10-CM | POA: Diagnosis not present

## 2023-05-09 DIAGNOSIS — Z6839 Body mass index (BMI) 39.0-39.9, adult: Secondary | ICD-10-CM

## 2023-05-09 DIAGNOSIS — Z794 Long term (current) use of insulin: Secondary | ICD-10-CM

## 2023-05-09 DIAGNOSIS — I639 Cerebral infarction, unspecified: Secondary | ICD-10-CM

## 2023-05-09 DIAGNOSIS — Z9861 Coronary angioplasty status: Secondary | ICD-10-CM | POA: Diagnosis not present

## 2023-05-09 DIAGNOSIS — E66812 Obesity, class 2: Secondary | ICD-10-CM

## 2023-05-09 DIAGNOSIS — E785 Hyperlipidemia, unspecified: Secondary | ICD-10-CM

## 2023-05-09 DIAGNOSIS — E1165 Type 2 diabetes mellitus with hyperglycemia: Secondary | ICD-10-CM | POA: Diagnosis not present

## 2023-05-09 DIAGNOSIS — Z01810 Encounter for preprocedural cardiovascular examination: Secondary | ICD-10-CM

## 2023-05-09 DIAGNOSIS — Z87891 Personal history of nicotine dependence: Secondary | ICD-10-CM

## 2023-05-09 DIAGNOSIS — I10 Essential (primary) hypertension: Secondary | ICD-10-CM

## 2023-05-09 NOTE — Patient Instructions (Addendum)
Medication Instructions:  Your physician has recommended you make the following change in your medication:   STOP Clopidogrel (Plavix)   *If you need a refill on your cardiac medications before your next appointment, please call your pharmacy*  Lab Work: None ordered today. If you have labs (blood work) drawn today and your tests are completely normal, you will receive your results only by: MyChart Message (if you have MyChart) OR A paper copy in the mail If you have any lab test that is abnormal or we need to change your treatment, we will call you to review the results.  Testing/Procedures: None ordered today.  Follow-Up: At [facility], you and your health needs are our priority.  As part of our continuing mission to provide you with exceptional heart care, we have created designated Provider Care Teams.  These Care Teams include your primary Cardiologist (physician) and Advanced Practice Providers (APPs -  Physician Assistants and Nurse Practitioners) who all work together to provide you with the care you need, when you need it.  We recommend signing up for the patient portal called "MyChart".  Sign up information is provided on this After Visit Summary.  MyChart is used to connect with patients for Virtual Visits (Telemedicine).  Patients are able to view lab/test results, encounter notes, upcoming appointments, etc.  Non-urgent messages can be sent to your provider as well.   To learn more about what you can do with MyChart, go to [email or web address].    Your next appointment:   6 month(s)  The format for your next appointment:   In Person  Provider:   Tessa Lerner, DO {

## 2023-05-09 NOTE — Progress Notes (Signed)
Cardiology Office Note:  .   Date:  05/09/2023  ID:  [name], DOB 29-Sep-1975, MRN [id number] PCP:  Hoy Register, MD  Former Cardiology Providers: N/A [facility] Providers Cardiologist:  Tessa Lerner, DO , [facility] (established care in January 2024) Electrophysiologist:  None  Click to update primary MD,subspecialty MD or APP then REFRESH:1}    Chief Complaint  Patient presents with   Atherosclerosis of native coronary artery of native heart w   Follow-up   Pre-op Exam    History of Present Illness: .   Julie Robertson is a 47 y.o. Spanish-speaking female whose past medical history and cardiovascular risk factors includes: Coronary artery disease status post PTCA and Cutting Balloon angioplasty to mLCx, left occipital lobe stroke in June 2023, prior infarct right corona radiata (per imaging), insulin dependent type 2 diabetes, dyslipidemia, GERD, .former smoker (38 pack year history), sleep apnea on device therapy, obesity due to excess calories.   In June 2023 patient had undergone a stroke workup was noted to have a positive PFO by transcranial Doppler as well as TEE.  However due to lack of insurance patient did not establish care with cardiology until January 2024.  Zio patch did not illustrate atrial fibrillation during the monitoring period.  We have discussed loop recorder implant however patient wanted to hold off.  In the interim she developed chest pain concerning for cardiac discomfort and underwent ischemic workup was noted to have obstructive disease in the LCx distribution and underwent coronary intervention.  Patient is accompanied by a medical interpreter [name] at today's office visit prior translation between [place] and [place].  Since last office visit she denies any anginal chest pain or heart failure symptoms.  She has been following a stricter diet, goes to the gym for approximately 2 hours majority of the times  cardiac workout and some mild resistance training 5 days a week.  She is also on Ozempic.  These efforts have helped facilitate weight loss.  She still planning to undergo bariatric surgery.  Preoperative letter was provided in September 2024, see epic.  Since last office visit patient has also been diagnosed with sleep apnea and using her device on a regular basis-proximately 6 hours.   Review of Systems: .   Review of Systems  Constitutional: Positive for weight loss.  Cardiovascular:  Positive for dyspnea on exertion (Improved.). Negative for chest pain, claudication, irregular heartbeat, leg swelling, near-syncope, orthopnea, palpitations, paroxysmal nocturnal dyspnea and syncope.  Hematologic/Lymphatic: Negative for bleeding problem.  Musculoskeletal:  Negative for muscle cramps and myalgias.  Neurological:  Negative for dizziness and light-headedness.    Studies Reviewed:   EKG: EKG Interpretation Date/Time:  Tuesday May 09 2023 09:50:46 EST Ventricular Rate:  73 PR Interval:  182 QRS Duration:  88 QT Interval:  386 QTC Calculation: 425 R Axis:   -9  Text Interpretation: Normal sinus rhythm Normal ECG When compared with ECG of 09-Aug-2022 15:54, No significant change was found Confirmed by Tessa Lerner [phone]) on 05/09/2023 10:08:07 AM  Echocardiogram: 12/21/2021:  1. Left ventricular ejection fraction, by estimation, is 60 to 65%. The  left ventricle has normal function. The left ventricle has no regional  wall motion abnormalities. There is mild concentric left ventricular  hypertrophy. Left ventricular diastolic  parameters were normal.   2. Right ventricular systolic function is normal. The right ventricular  size is normal. Tricuspid regurgitation signal is inadequate for assessing  PA pressure.   3. A small pericardial effusion  is present. The pericardial effusion is  circumferential.   4. The mitral valve is normal in structure. No evidence of mitral valve   regurgitation. No evidence of mitral stenosis.   5. The aortic valve is tricuspid. There is mild thickening of the aortic  valve. Aortic valve regurgitation is not visualized. No aortic stenosis is  present.      Transesophageal echocardiogram 12/24/2021: Atrial septal aneurysm; no shunting noted with color doppler but positive saline microcavitation study suggests PFO. 2.Left ventricular ejection fraction, by estimation, is 55 to 60%. The left ventricle has normal function. The left ventricle has no regional wall motion abnormalities. 3. Right ventricular systolic function is normal. The right ventricular size is normal. 4. No left atrial/left atrial appendage thrombus was detected. 5. The mitral valve is normal in structure. Trivial mitral valve regurgitation. The aortic valve is tricuspid. Aortic valve regurgitation is trivial. Aortic valve sclerosis is present, with no evidence of aortic valve stenosis. 6. Agitated saline contrast bubble study was positive with shunting observed within 3-6 cardiac cycles suggestive of interatrial shunt.   Stress Testing: Exercise nuclear stress test 07/19/2022: Myocardial perfusion is abnormal. Prominent breast attenuation in the inferior wall.  There is mild mid to apical anteroseptal thinning in stress images suggestive of a small reversible mild defect. TID ratio 1.43, which is abnormal. Stress LV EF is normal 52%. Overall LV systolic function is normal without regional wall motion abnormalities. Stress LV EF: 52%.  Normal ECG stress. The patient exercised for 4 minutes and 13 seconds of a Bruce protocol, achieving approximately 6.1 METs & 86% MPHR. NO chest pain, stress terminated due to THR achieved. Normal BP response.  No previous exam available for comparison. Intermediate risk due to abnormal TID ratio. Clinical correlation recommended in a patient with BMI >40. ~~Personally reviewed myocardial perfusion imaging which is more concerning for  reversible ischemia in the basal to mid inferolateral and apical lateral segments suggestive of ischemia in the LCx distribution.   Heart Catheterization: Left Heart Catheterization 08/09/22:  LV 130/4, EDP 12 mmHg.  Ao 07/04/2010/72, mean 90 mmHg.  No pressure gradient across the aortic valve. LM: Large-caliber vessel.  Smooth and normal. LAD: Large-caliber vessel.  It is smooth and normal.  Gives origin to a very large diagonal 1 with a secondary branch.  LAD ends before reaching the apex. LCx: Large-caliber vessel.  Gives origin to a very tiny OM1, continues as large OM 2 and 3.  There is a focal 90% concentric stenosis. RCA: Large-caliber vessel.  Slow flow is noted in the right coronary artery.  Mid segment has a minimal luminal irregularity.   Intervention data: Successful PTCA and scoring balloon angioplasty with a 3.5 x 10 mm Wolverine cutting balloon.  Stenosis reduced from 90% to 0% with TIMI-3 to TIMI-3 flow, much better flow postangioplasty.      Recommendation: Patient will need at least 3 months of Plavix, aspirin indefinitely.  Transcranial Doppler with bubbles: 12/21/2021 Positive TCD Bubble study with valasalva only indicative of a small right to left shunt   RADIOLOGY: CT brain without contrast 12/20/2021: Chronic changes with no acute intracranial process identified.    MRI brain with and without contrast 12/21/2021: Brain MRI: 1. Patchy acute cortical infarcts in the left occipital lobe with extension along the posterior left watershed. 2. Remote perforator infarct the right corona radiata.   Cervical spine: 1. Normal motion degraded appearance of the cord. 2. C5-6 large right paracentral to foraminal protrusion encroaching on the right  cord and causing right foraminal impingement.   CT angio head and neck with and without contrast code stroke protocol 12/21/2021: 1. No emergent vascular finding. 2. Atherosclerosis without flow limiting stenosis of major vessels. 3.  Left basal ganglia and left occipital cortex calcifications, are there risk factors for remote neurocysticercosis?   CT of the head without contrast 12/23/2021: 1. No acute intracranial hemorrhage. 2. Patchy left occipital lobe peau attenuation, compatible with known area of infarction.  Risk Assessment/Calculations:   NA   Labs:       Latest Ref Rng & Units 03/29/2023   11:34 AM 08/10/2022    5:49 AM 07/21/2022   10:10 AM  CBC  WBC 3.4 - 10.8 x10E3/uL 8.0  5.4  6.2   Hemoglobin 11.1 - 15.9 g/dL 56.2  9.6  13.0   Hematocrit 34.0 - 46.6 % 40.1  32.0  37.3   Platelets 150 - 450 x10E3/uL 382  350  404        Latest Ref Rng & Units 03/29/2023   11:34 AM 08/22/2022    2:10 PM 08/10/2022    5:49 AM  BMP  Glucose 70 - 99 mg/dL 865  784  696   BUN 6 - 24 mg/dL 18  15  11    Creatinine 0.57 - 1.00 mg/dL 2.95  2.84  1.32   BUN/Creat Ratio 9 - 23 19  15     Sodium 134 - 144 mmol/L 139  135  130   Potassium 3.5 - 5.2 mmol/L 4.4  4.2  3.7   Chloride 96 - 106 mmol/L 104  100  99   CO2 20 - 29 mmol/L 22  21  23    Calcium 8.7 - 10.2 mg/dL 9.6  9.2  8.7       Latest Ref Rng & Units 03/29/2023   11:34 AM 08/22/2022    2:10 PM 08/10/2022    5:49 AM  CMP  Glucose 70 - 99 mg/dL 440  102  725   BUN 6 - 24 mg/dL 18  15  11    Creatinine 0.57 - 1.00 mg/dL 3.66  4.40  3.47   Sodium 134 - 144 mmol/L 139  135  130   Potassium 3.5 - 5.2 mmol/L 4.4  4.2  3.7   Chloride 96 - 106 mmol/L 104  100  99   CO2 20 - 29 mmol/L 22  21  23    Calcium 8.7 - 10.2 mg/dL 9.6  9.2  8.7   Total Protein 6.0 - 8.5 g/dL 7.7  7.6    Total Bilirubin 0.0 - 1.2 mg/dL 0.3  0.3    Alkaline Phos 44 - 121 IU/L 55  80    AST 0 - 40 IU/L 44  107    ALT 0 - 32 IU/L 39  104      Lab Results  Component Value Date   CHOL 120 03/29/2023   HDL 33 (L) 03/29/2023   LDLCALC 64 03/29/2023   LDLDIRECT 64 07/21/2022   TRIG 127 03/29/2023   CHOLHDL 6.0 12/21/2021   Recent Labs    08/10/22 0549  LIPOA 83.1*   No components found  for: "NTPROBNP" No results for input(s): "PROBNP" in the last 8760 hours. No results for input(s): "TSH" in the last 8760 hours.   Physical Exam:    Today's Vitals   05/09/23 0946  BP: 100/72  Pulse: 73  Resp: 16  SpO2: 94%  Weight: 232 lb (105.2 kg)  Height: 5\' 4"  (  1.626 m)   Body mass index is 39.82 kg/m. Wt Readings from Last 3 Encounters:  05/09/23 232 lb (105.2 kg)  03/29/23 238 lb (108 kg)  03/21/23 238 lb 12.8 oz (108.3 kg)    Physical Exam  Constitutional: No distress.  Age appropriate, hemodynamically stable.   Neck: No JVD present.  Cardiovascular: Normal rate, regular rhythm, S1 normal, S2 normal, intact distal pulses and normal pulses. Exam reveals no gallop, no S3 and no S4.  No murmur heard. Pulmonary/Chest: Effort normal and breath sounds normal. No stridor. She has no wheezes. She has no rales.  Abdominal: Soft. Bowel sounds are normal. She exhibits no distension. There is no abdominal tenderness.  Obese  Musculoskeletal:        General: No edema.     Cervical back: Neck supple.  Neurological: She is alert and oriented to person, place, and time. She has intact cranial nerves (2-12).  Skin: Skin is warm and moist.     Impression & Recommendation(s):  Impression:   ICD-10-CM   1. Atherosclerosis of native coronary artery of native heart without angina pectoris  I25.10 EKG 12-Lead    2. S/P PTCA (percutaneous transluminal coronary angioplasty)  Z98.61     3. Type 2 diabetes mellitus with hyperglycemia, with long-term current use of insulin (HCC)  E11.65    Z79.4     4. Preop cardiovascular exam  Z01.810     5. Essential hypertension  I10     6. Dyslipidemia  E78.5     7. Cerebrovascular accident (CVA), unspecified mechanism (HCC)  I63.9     8. PFO with atrial septal aneurysm  Q21.12    I25.3     9. Former smoker  Z87.891     10. Class 2 severe obesity due to excess calories with serious comorbidity and body mass index (BMI) of 39.0 to 39.9  in adult [facility])  N56.213    E66.01    Z68.39        Recommendation(s):  Atherosclerosis of native coronary artery of native heart without angina pectoris S/P PTCA (percutaneous transluminal coronary angioplasty) Denies anginal chest pain. No use of sublingual nitroglycerin tablets since the last office visit. EKG today illustrates sinus rhythm without ischemia or injury pattern Has completed at least 3 months of dual antiplatelet therapy after her recent coronary intervention. Stop Plavix 75 mg p.o. daily. Continue aspirin 81 mg p.o. daily. Patient has implemented lifestyle changes, currently on Ozempic, and increase physical activity all of which have facilitated weight loss.  She is congratulated on her efforts.  She still plans to undergo bariatric surgery as originally planned.  Type 2 diabetes mellitus with hyperglycemia, with long-term current use of insulin (HCC) Hemoglobin A1c 6.4 in September 2024. Plan on statin therapy, Jardiance, fenofibrate, ARB, Ozempic, metformin. Patient was also includes Mounjaro-patient states that he is not actively taking it but is considering the switch from Ozempic to [facility] if possible.  Will defer prescription management to her weight loss clinic.  Preop cardiovascular exam Patient is considering laparoscopic bariatric surgery. From a cardiovascular standpoint she is considered to be at acceptable risk for upcoming surgery She will stop Plavix as discussed above. She can hold aspirin for 7 days prior to the procedure and to restart when appropriate hemostasis is achieved and cleared by her surgeon/proceduralist. Letter was only sent back in September 2024-see epic  Essential hypertension Office blood pressure well-controlled-she is asymptomatic. Medications reconciled  Dyslipidemia Continue atorvastatin 40 mg p.o. daily. Continue fenofibrate 145  mg p.o. daily. LDL is 64 mg/dL as of September 1610. Recommend a goal LDL at least less than  70 mg/dL goal less than 55 mg/dL given her young age and CAD and coronary intervention. Continue to follow  Cerebrovascular accident (CVA), unspecified mechanism (HCC) PFO with atrial septal aneurysm During her stroke in 2023 she was noted to have a PFO at that time she did not pursue having the PFO closed due to insurance reasons. I have discussed the importance of this at today's office visit.   Patient voices that she would like to hold off until her bariatric surgery is complete as she has remained asymptomatic since the index event.  Class 2 severe obesity due to excess calories with serious comorbidity and body mass index (BMI) of 39.0 to 39.9 in adult [facility]) Body mass index is 39.82 kg/m. Has lost approximately 35 pounds since February 2024 I reviewed with her importance of diet, regular physical activity/exercise, weight loss.   Patient is educated on the importance of increasing physical activity gradually as tolerated with a goal of moderate intensity exercise for 30 minutes a day 5 days a week.  Orders Placed:  Orders Placed This Encounter  Procedures   EKG 12-Lead    Final Medication List:   No orders of the defined types were placed in this encounter.   Medications Discontinued During This Encounter  Medication Reason   Cholecalciferol (VITAMIN D3) 1.25 MG (50000 UT) CAPS Patient Preference   clopidogrel (PLAVIX) 75 MG tablet Discontinued by provider     Current Outpatient Medications:    albuterol (VENTOLIN HFA) 108 (90 Base) MCG/ACT inhaler, Inhale 2 puffs into the lungs every 6 (six) hours as needed for wheezing or shortness of breath., Disp: 18 g, Rfl: 1   aspirin 81 MG chewable tablet, Chew 1 tablet (81 mg total) by mouth daily., Disp: 90 tablet, Rfl: 1   atorvastatin (LIPITOR) 40 MG tablet, Take 1 tablet (40 mg total) by mouth at bedtime., Disp: 90 tablet, Rfl: 3   blood glucose meter kit and supplies KIT, Use up to four times daily as directed., Disp: 1 each,  Rfl: 11   Blood Glucose Monitoring Suppl (TRUE METRIX METER) w/Device KIT, Use as directed, Disp: 1 kit, Rfl: 0   Blood Glucose Monitoring Suppl (TRUE METRIX METER) w/Device KIT, use up to 4 times daily as directed, Disp: 1 kit, Rfl: 0   CALCIUM PO, Take 200 mg by mouth daily., Disp: , Rfl:    Continuous Glucose Sensor (FREESTYLE LIBRE 3 SENSOR) MISC, Use as directed., Disp: 2 each, Rfl: 3   Continuous Glucose Sensor (FREESTYLE LIBRE 3 SENSOR) MISC, USE AS DIRECTED, Disp: 2 each, Rfl: 3   Docusate Sodium (DSS) 100 MG CAPS, Take 100 mg by mouth daily., Disp: , Rfl:    empagliflozin (JARDIANCE) 25 MG TABS tablet, Take 1 tablet (25 mg total) by mouth daily before breakfast., Disp: 90 tablet, Rfl: 1   fenofibrate (TRICOR) 145 MG tablet, Take 1 tablet (145 mg total) by mouth daily., Disp: 90 tablet, Rfl: 2   gabapentin (NEURONTIN) 300 MG capsule, Take 1 capsule (300 mg total) by mouth 3 (three) times daily., Disp: 270 capsule, Rfl: 2   glipiZIDE (GLUCOTROL) 10 MG tablet, Take 1 tablet (10 mg total) by mouth 2 (two) times daily., Disp: 180 tablet, Rfl: 1   glucose blood (ACCU-CHEK GUIDE) test strip, use as directed, Disp: 100 each, Rfl: 6   Insulin Glargine (BASAGLAR KWIKPEN) 100 UNIT/ML, Inject 38 Units into  the skin daily., Disp: 18 mL, Rfl: 6   Insulin Pen Needle (TRUEPLUS 5-BEVEL PEN NEEDLES) 32G X 4 MM MISC, USE AS DIRECTED, Disp: 100 each, Rfl: 3   Lancets MISC, use as directed, Disp: 100 each, Rfl: 0   latanoprost (XALATAN) 0.005 % ophthalmic solution, Place 1 drop into both eyes every evening., Disp: 5 mL, Rfl: 11   linaclotide (LINZESS) 290 MCG CAPS capsule, Take 1 capsule (290 mcg total) by mouth daily before breakfast., Disp: 90 capsule, Rfl: 1   loratadine (CLARITIN) 10 MG tablet, Take 1 tablet (10 mg total) by mouth daily., Disp: 30 tablet, Rfl: 11   losartan (COZAAR) 25 MG tablet, Take 25 mg by mouth daily., Disp: , Rfl:    Magnesium Citrate 200 MG TABS, Take 200 mg by mouth daily., Disp:  , Rfl:    MAGNESIUM CITRATE PO, Take 200 mg by mouth daily., Disp: , Rfl:    Melatonin 10 MG CHEW, Chew 10 mg by mouth daily as needed., Disp: , Rfl:    metoprolol succinate (TOPROL XL) 25 MG 24 hr tablet, Take 1 tablet (25 mg total) by mouth daily., Disp: 90 tablet, Rfl: 1   Multiple Vitamin tablet, Take 1 tablet by mouth daily., Disp: , Rfl:    Multiple Vitamins-Minerals (HAIR SKIN & NAILS) TABS, Take 1 tablet by mouth daily., Disp: , Rfl:    nabumetone (RELAFEN) 500 MG tablet, Take 1 tablet (500 mg total) by mouth daily., Disp: 90 tablet, Rfl: 0   Omega-3 Fatty Acids (FISH OIL) 1200 MG CAPS, Take 1 capsule by mouth daily., Disp: , Rfl:    pantoprazole (PROTONIX) 40 MG tablet, Take 1 tablet (40 mg total) by mouth daily before breakfast. (Stop taking omeprazole)., Disp: 90 tablet, Rfl: 1   Semaglutide, 2 MG/DOSE, 8 MG/3ML SOPN, Inject 2 mg as directed once a week., Disp: 3 mL, Rfl: 6   tirzepatide (MOUNJARO) 10 MG/0.5ML Pen, Inject 10 mg into the skin once a week. (Patient taking differently: Inject 10 mg into the skin once a week. On Hold), Disp: 2 mL, Rfl: 0   tiZANidine (ZANAFLEX) 4 MG tablet, Take 1 tablet (4 mg total) by mouth every 8 (eight) hours as needed for muscle spasms., Disp: 60 tablet, Rfl: 1   metFORMIN (GLUCOPHAGE) 500 MG tablet, Take 2 tablets (1,000 mg total) by mouth 2 (two) times daily with a meal., Disp: 120 tablet, Rfl: 3   nitroGLYCERIN (NITROSTAT) 0.4 MG SL tablet, Place 1 tablet (0.4 mg total) under the tongue every 5 (five) minutes as needed for chest pain. If you require more than two tablets five minutes apart go to the nearest ER via EMS., Disp: 30 tablet, Rfl: 0  Consent:   N/A  Disposition:   6 months sooner if needed-CAD/prior coronary interventions/risk factors Patient may be asked to follow-up sooner based on the results of the above-mentioned testing.  Her questions and concerns were addressed to her satisfaction. She voices understanding of the  recommendations provided during this encounter.    Signed, Tessa Lerner, DO, [facility]  [facility]  [address] [place], [place] [address] 05/09/2023 5:41 PM

## 2023-05-10 ENCOUNTER — Telehealth: Payer: Self-pay | Admitting: *Deleted

## 2023-05-10 NOTE — Telephone Encounter (Addendum)
   Patient Name: Shanyiah Conde [facility]  DOB: [birth year] MRN: [id number]  Primary Cardiologist: Tessa Lerner, DO  Chart reviewed as part of pre-operative protocol coverage. Given past medical history and time since last visit, based on ACC/AHA guidelines, Buna Cuppett is at acceptable risk for the planned procedure without further cardiovascular testing.   The patient was advised that if she develops new symptoms prior to surgery to contact our office to arrange for a follow-up visit, and she verbalized understanding.  Patient can hold Plavix 5 days prior to procedure and should restart postprocedure when surgically safe and hemostasis is achieved.  I will route this recommendation to the requesting party via Epic fax function and remove from pre-op pool.  Please call with questions.  Napoleon Form, Leodis Rains, NP 05/10/2023, 5:54 PM

## 2023-05-10 NOTE — Telephone Encounter (Signed)
   Pre-operative Risk Assessment    Patient Name: Julie Robertson  DOB: Aug 03, 1975 MRN: [id number]  DATE OF LAST VISIT: 05/09/23 DR. TOLIA DATE OF NEXT VISIT: NONE    Request for Surgical Clearance    Procedure:   EGD ; CLEARANCE FORM WAS NOT CIRCLED WHICH PROCEDURE; HOWEVER REVIEWED NOTES FROM GI AND FOUND PROCEDURE LISTED AS EGD TO BE DONE  Date of Surgery:  Clearance 05/25/23                                 Surgeon:  DR. GONZALEZ Surgeon's Group or Practice Name:  [facility] Phone number:  [phone] Fax number:  [phone]   Type of Clearance Requested:   - Medical  - Pharmacy:  Hold Clopidogrel (Plavix) x 4 DAYS PRIOR   Type of Anesthesia:  Not Indicated   Additional requests/questions:    Elpidio Anis   05/10/2023, 5:33 PM

## 2023-05-11 ENCOUNTER — Ambulatory Visit: Payer: Medicaid Other | Admitting: Dermatology

## 2023-05-11 ENCOUNTER — Telehealth: Payer: Self-pay

## 2023-05-11 ENCOUNTER — Other Ambulatory Visit: Payer: Self-pay

## 2023-05-11 ENCOUNTER — Encounter: Payer: Self-pay | Admitting: Dermatology

## 2023-05-11 VITALS — BP 116/76 | HR 74

## 2023-05-11 DIAGNOSIS — D492 Neoplasm of unspecified behavior of bone, soft tissue, and skin: Secondary | ICD-10-CM

## 2023-05-11 DIAGNOSIS — E119 Type 2 diabetes mellitus without complications: Secondary | ICD-10-CM

## 2023-05-11 DIAGNOSIS — D485 Neoplasm of uncertain behavior of skin: Secondary | ICD-10-CM

## 2023-05-11 DIAGNOSIS — D2262 Melanocytic nevi of left upper limb, including shoulder: Secondary | ICD-10-CM

## 2023-05-11 DIAGNOSIS — D2239 Melanocytic nevi of other parts of face: Secondary | ICD-10-CM | POA: Diagnosis not present

## 2023-05-11 DIAGNOSIS — D1801 Hemangioma of skin and subcutaneous tissue: Secondary | ICD-10-CM | POA: Diagnosis not present

## 2023-05-11 DIAGNOSIS — D2261 Melanocytic nevi of right upper limb, including shoulder: Secondary | ICD-10-CM

## 2023-05-11 DIAGNOSIS — Z808 Family history of malignant neoplasm of other organs or systems: Secondary | ICD-10-CM

## 2023-05-11 NOTE — Progress Notes (Signed)
   New Patient Visit   Subjective  Julie Robertson is a 47 y.o. female who presents for the following: Bump  Patient states she has bump located at the posterior left arm that she would like to have examined. Patient reports the areas have been there for 1 year. She reports the areas are bothersome.Patient rates irritation 10 out of 10. She states that the areas have not spread. Patient reports she has not previously been treated for these areas. Patient denies Hx of bx. Patient reports family history of skin cancer(s).  The patient has spots, moles and lesions to be evaluated, some may be new or changing and the patient may have concern these could be cancer.  The following portions of the chart were reviewed this encounter and updated as appropriate: medications, allergies, medical history  Review of Systems:  No other skin or systemic complaints except as noted in HPI or Assessment and Plan.  Objective  Well appearing patient in no apparent distress; mood and affect are within normal limits.  A focused examination was performed of the following areas: Posterior LUE  Relevant exam findings are noted in the Assessment and Plan.  Left Shoulder - Posterior A: 5 mm irritated pink papule  Right Buccal Cheek B: 8 mm Tan papule              Assessment & Plan   VISIT SUMMARY: Julie Robertson presented with two growing spots on her arms, one of which is red and irritated. She has a family history of skin cancer and is diabetic. A dermatological examination revealed a 5 mm irritated pink papule on her left posterior arm and an 8 mm tan papule on her right arm. Shave biopsies are planned for both lesions, with results to be communicated within a week. The patient was reassured about a benign cherry angioma and advised on diabetes management and skin cancer prevention.  1. Irritated Mole on Left Posterior Arm - Plan: Perform shave biopsy of the 5mm irritated pink papule on  the left posterior arm. Send the specimen to the lab for histopathological examination. Inform the patient of the results via MyChart within one week.  2. Enlarged Mole on Right Arm - Assessment: Intradermal nevus, not irritated. - Plan: Perform shave biopsy of the 8mm tan papule on the right arm. Send the specimen to the lab for histopathological examination. Inform the patient of the results via MyChart within one week.  3. 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 - Plan: Reassure the patient that the cherry angioma is benign and requires no intervention at this time.  4. Diabetes - Plan: Continue current diabetes management. Encourage regular follow-up with primary care physician for optimal diabetes control.  5. Family History of Skin Cancer - Plan: Educate the patient on the importance of regular skin self-examinations and annual dermatology visits for skin cancer screening. Recommend sun protection measures, including wearing sunscreen with SPF 30 or higher, protective clothing, and seeking shade when outdoors.      No follow-ups on file.    Documentation: I have reviewed the above documentation for accuracy and completeness, and I agree with the above.  Stasia Cavalier, am acting as scribe for Langston Reusing, DO.  Langston Reusing, DO

## 2023-05-11 NOTE — Telephone Encounter (Signed)
Pts daughter said pt has been bleeding since bx earlier today. I told her hold firm pressure for 30 min w/o peeking if it continues another and rebandage the spot with a fresh bandage

## 2023-05-11 NOTE — Patient Instructions (Addendum)

## 2023-05-12 ENCOUNTER — Other Ambulatory Visit: Payer: Self-pay

## 2023-05-15 ENCOUNTER — Other Ambulatory Visit: Payer: Self-pay

## 2023-05-15 LAB — SURGICAL PATHOLOGY

## 2023-05-15 MED ORDER — PREDNISOLONE ACETATE 1 % OP SUSP
OPHTHALMIC | 0 refills | Status: AC
  Filled 2023-05-15: qty 5, 12d supply, fill #0

## 2023-05-16 ENCOUNTER — Telehealth: Payer: Self-pay

## 2023-05-16 NOTE — Telephone Encounter (Signed)
-----   Message from Langston Reusing sent at 05/16/2023 10:08 AM EST ----- Hi Merdith Boyd,  This patient has no active mychart.  Please call patient with path results. Reassure results were benign and no additional treatment is required.  Thanks!

## 2023-05-16 NOTE — Telephone Encounter (Signed)
If pt is still bleeding then she will need to come into the office for cautery to stop the bleeding.  She can be squeezed into an 8am appointment tomorrow or Thursday.

## 2023-05-16 NOTE — Telephone Encounter (Signed)
Pt has called back and was informed of results and expressed understanding.   However, pt stated that she is still bleeding from the biopsy site on her cheek. She has tried home remedied of applying guaze with pressure. She also mentioned that she has used ice on the area with no help.   Please advise.

## 2023-05-16 NOTE — Telephone Encounter (Signed)
Called pt, LVM to discuss results.

## 2023-05-16 NOTE — Progress Notes (Signed)
 Hi Shirron,  This patient has no active mychart.  Please call patient with path results. Reassure results were benign and no additional treatment is required.  Thanks!

## 2023-05-17 ENCOUNTER — Encounter: Payer: Self-pay | Admitting: Physical Therapy

## 2023-05-17 ENCOUNTER — Ambulatory Visit: Payer: Medicaid Other | Attending: Family | Admitting: Physical Therapy

## 2023-05-17 ENCOUNTER — Other Ambulatory Visit: Payer: Self-pay

## 2023-05-17 DIAGNOSIS — R293 Abnormal posture: Secondary | ICD-10-CM | POA: Diagnosis present

## 2023-05-17 DIAGNOSIS — G8929 Other chronic pain: Secondary | ICD-10-CM | POA: Diagnosis present

## 2023-05-17 DIAGNOSIS — M6281 Muscle weakness (generalized): Secondary | ICD-10-CM | POA: Diagnosis present

## 2023-05-17 DIAGNOSIS — M25512 Pain in left shoulder: Secondary | ICD-10-CM | POA: Diagnosis present

## 2023-05-17 NOTE — Therapy (Signed)
OUTPATIENT PHYSICAL THERAPY SHOULDER EVALUATION   Patient Name: Julie Robertson MRN: [id number] DOB:1976-06-12, 47 y.o., female Today's Date: 05/17/2023  END OF SESSION:  PT End of Session - 05/17/23 1449     Visit Number 2    Number of Visits 9    Date for PT Re-Evaluation 06/28/23    Authorization Type [place] [employer]    PT Start Time 0250    PT Stop Time 0328    PT Time Calculation (min) 38 min             Past Medical History:  Diagnosis Date   Anxiety    Asthma    Coronary artery disease    CVA (cerebral vascular accident) (HCC)    Diabetes mellitus without complication (HCC)    Diabetic neuropathy (HCC) 12/2013   vs carpal tunnell.  numbness tingling in right fingers. rx with Gabapentin.   Dyslipidemia 08/2009   Dyspnea    Gall stones 08/2009   GERD (gastroesophageal reflux disease)    H/O tubal ligation    Obesity    BMI 41, 250# 03/2015   Past Surgical History:  Procedure Laterality Date   APPLICATION OF WOUND VAC N/A 05/09/2019   Procedure: APPLICATION OF WOUND VAC;  Surgeon: Henrene Dodge, MD;  Location: [facility] ORS;  Service: General;  Laterality: N/A;  [id number]   BUBBLE STUDY  12/24/2021   Procedure: BUBBLE STUDY;  Surgeon: Lewayne Bunting, MD;  Location: [facility] ENDOSCOPY;  Service: Cardiovascular;;   CHOLECYSTECTOMY N/A 03/10/2015   Procedure: LAPAROSCOPIC CHOLECYSTECTOMY WITH INTRAOPERATIVE CHOLANGIOGRAM;  Surgeon: Violeta Gelinas, MD;  Location: [facility] OR;  Service: General;  Laterality: N/A;   CORONARY BALLOON ANGIOPLASTY N/A 08/09/2022   Procedure: CORONARY BALLOON ANGIOPLASTY;  Surgeon: Yates Decamp, MD;  Location: [facility] INVASIVE CV LAB;  Service: Cardiovascular;  Laterality: N/A;   ERCP N/A 03/11/2015   Procedure: ENDOSCOPIC RETROGRADE CHOLANGIOPANCREATOGRAPHY (ERCP);  Surgeon: Rachael Fee, MD;  Location: [facility] ENDOSCOPY;  Service: Endoscopy;  Laterality: N/A;   ESOPHAGOGASTRODUODENOSCOPY (EGD) WITH PROPOFOL N/A 07/25/2018   Procedure:  ESOPHAGOGASTRODUODENOSCOPY (EGD) WITH PROPOFOL;  Surgeon: Meridee Score Netty Starring., MD;  Location: [facility] ENDOSCOPY;  Service: Gastroenterology;  Laterality: N/A;   INSERTION OF MESH N/A 05/09/2019   Procedure: INSERTION OF MESH;  Surgeon: Henrene Dodge, MD;  Location: [facility] ORS;  Service: General;  Laterality: N/A;   LEFT HEART CATH AND CORONARY ANGIOGRAPHY N/A 08/09/2022   Procedure: LEFT HEART CATH AND CORONARY ANGIOGRAPHY;  Surgeon: Yates Decamp, MD;  Location: [facility] INVASIVE CV LAB;  Service: Cardiovascular;  Laterality: N/A;   TEE WITHOUT CARDIOVERSION N/A 12/24/2021   Procedure: TRANSESOPHAGEAL ECHOCARDIOGRAM (TEE);  Surgeon: Lewayne Bunting, MD;  Location: [facility] ENDOSCOPY;  Service: Cardiovascular;  Laterality: N/A;   Transduodenal Ampullectomy  09/2015   TUBAL LIGATION     VENTRAL HERNIA REPAIR N/A 05/09/2019   Procedure: HERNIA REPAIR VENTRAL ADULT with MESH;  Surgeon: Henrene Dodge, MD;  Location: [facility] ORS;  Service: General;  Laterality: N/A;   Patient Active Problem List   Diagnosis Date Noted   Chronic constipation 05/04/2023   Atherosclerosis of native coronary artery of native heart with stable angina pectoris (HCC) 08/10/2022   Mixed hyperlipidemia 08/10/2022   Hypertriglyceridemia 08/10/2022   History of stroke 08/10/2022   Class 3 severe obesity due to excess calories with serious comorbidity and body mass index (BMI) of 40.0 to 44.9 in adult [facility]) 08/10/2022   PFO (patent foramen ovale) 08/10/2022   Post PTCA 08/09/2022   PFO with atrial septal  aneurysm 12/24/2021   Acute ischemic stroke (HCC) 12/21/2021   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   Acute pulmonary edema (HCC) 09/06/2020   Incisional hernia, without obstruction or gangrene 05/09/2019   Uncontrolled type 2 diabetes mellitus with hyperglycemia, with long-term current use of insulin (HCC) 04/12/2017   Hypertension associated with diabetes (HCC) 04/12/2017   Morbid (severe) obesity due to excess calories (HCC)  10/18/2016   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   Iron deficiency anemia 09/02/2015   Anemia 09/01/2015   Asthma 08/28/2015   Type 2 diabetes mellitus (HCC) 07/31/2015   Dyslipidemia 12/04/2013   Numbness and tingling in right hand 12/04/2013   Smoking 12/04/2013   Elevated BP 12/04/2013   Pap smear, high-risk (screening, no prior abnormality) 02/13/2013   Exposure to STD 02/13/2013    PCP: Hoy Register, MD  REFERRING PROVIDER: Adonis Huguenin, NP   REFERRING DIAG: [phone] (ICD-10-CM) - Acute pain of left shoulder due to trauma   THERAPY DIAG:  Chronic left shoulder pain  Muscle weakness (generalized)  Abnormal posture  Rationale for Evaluation and Treatment: Rehabilitation  ONSET DATE: Chronic  SUBJECTIVE:                                                                                                                                                                                      SUBJECTIVE STATEMENT: 05/17/23: Yes I am in pain but I took pain medicine which makes it feel better, not sure of the name. The shoulder is the same. Doing the exercises.   Pt presents to PT with reports of chronic L shoulder pain. Denies trauma or MOI, notes that she woke up and felt this pain. Orthopedics noted pt with lifting injury several months ago and feeling a "pop" in L shoulder. Has some instances of N/T in L UE, she is concerned that this was because of previous CVA, however, these symptoms are fairly new. She wants to have bariatric surgery, is waiting to see if she is an approved candidate.   Hand dominance: Right  PERTINENT HISTORY: CVA, DMII, HTN  PAIN:  Are you having pain?  Yes: NPRS scale: 6/10 after pain meds (9/10 before)  Worst: 10/10 Pain location: L shoulder Pain description: sharp Aggravating factors: OH reaching, lifting, laying on L side Relieving factors: rest, medication  PRECAUTIONS: None  RED FLAGS: None   WEIGHT BEARING RESTRICTIONS: No  FALLS:   Has patient fallen in last 6 months? No  LIVING ENVIRONMENT: Lives with: lives with their family Lives in: House/apartment  OCCUPATION: Not currently working  PLOF: Independent  PATIENT GOALS: decrease her shoulder pain, improve her comfort with dressing and other ADLs  OBJECTIVE:  Note: Objective measures were completed at Evaluation unless otherwise noted.  DIAGNOSTIC FINDINGS:  See imaging for MRI results  PATIENT SURVEYS:  FOTO: deferred due to time FOTO 05/17/23: 42 intake, 60 predicted (read by in person interpreter- spanish version)  COGNITION: Overall cognitive status: Within functional limits for tasks assessed     SENSATION: WFL  POSTURE: Rounded shoulders, fwd head  UPPER EXTREMITY ROM:   Active ROM Right eval Left eval LEFT 05/17/23  Shoulder flexion WFL 105 115  Shoulder extension     Shoulder abduction WFL 80 95  Shoulder adduction     Shoulder internal rotation T8 L4   Shoulder external rotation     Elbow flexion     Elbow extension     Wrist flexion     Wrist extension     Wrist ulnar deviation     Wrist radial deviation     Wrist pronation     Wrist supination     (Blank rows = not tested)  UPPER EXTREMITY MMT:  MMT Right eval Left eval  Shoulder flexion    Shoulder extension    Shoulder abduction    Shoulder adduction    Shoulder internal rotation 5 3+  Shoulder external rotation 5 3 with pain  Middle trapezius    Lower trapezius    Elbow flexion    Elbow extension    Wrist flexion    Wrist extension    Wrist ulnar deviation    Wrist radial deviation    Wrist pronation    Wrist supination    Grip strength (lbs)    (Blank rows = not tested)  SHOULDER SPECIAL TESTS: Impingement tests: Painful arc test: positive  SLAP lesions: DNT Instability tests: DNT Rotator cuff assessment: DNT Biceps assessment: DNT  JOINT MOBILITY TESTING:  GH hypomobility L  PALPATION:  TTP to L infraspinatus, L upper   TREATMENT: [facility]  Adult PT Treatment:                                                DATE: 05/17/23 Therapeutic Exercise: Shoulder ER/IR isometric review Shoulder wall slide Shoulder flexion walk back from counter- back pain Pulleys for shoulder flexion Supine chest press Supine pullover from chest height Supine YTB ER bilat x 10 FOTO 42    [facility] Adult PT Treatment:                                                DATE: 05/02/2023 Therapeutic Exercise: L upper trap stretch x 30"  Seated scapular retraction x 5  L shoulder IR/ER isometric x 5 - 50% force  PATIENT EDUCATION: Education details: eval findings, FOTO, HEP, POC Person educated: Patient Education method: Explanation, Demonstration, and Handouts Education comprehension: verbalized understanding and returned demonstration  HOME EXERCISE PROGRAM: Access Code: [email or web address] URL: [email or web address] Date: 05/02/2023 Prepared by: Edwinna Areola  Exercises - Seated Upper Trapezius Stretch (Mirrored)  - 1 x daily - 7 x weekly - 2 reps - 30 sec hold - Seated Scapular Retraction  - 1 x daily - 7 x weekly - 2 sets - 10 reps - 3 sec hold - Standing Isometric Shoulder Internal Rotation with Towel Roll at Doorway  - 1 x  daily - 7 x weekly - 2 sets - 10 reps - 3 sec hold - Standing Isometric Shoulder External Rotation with Doorway and Towel Roll  - 1 x daily - 7 x weekly - 2 sets - 10 reps - 3 sec hold  05/17/23  - Supine shoulder Flexion Extension AAROM with Dowel to comfortable height overhead  - 3 x daily - 7 x weekly - 2 sets - 10 reps - Shoulder Flexion Wall Slide with Towel  - 3 x daily - 7 x weekly - 2 sets - 10 reps - Supine shoulder external rotation- KNEES BENT  - 3 x daily - 7 x weekly - 2 sets - 10 reps  ASSESSMENT:  CLINICAL IMPRESSION: Pt reports compliance with HEP. No change in pain. AROM is slightly improved. Captured initial FOTO score: 42 . Progressed therex to include AAROM and progressed strengthening to resistance  bands . Updated HEP.   EVAL; Patient is a 47 y.o. F who was seen today for physical therapy evaluation and treatment for chronic L shoulder pain. Physical findings are consistent with orthopedics referral and MRI results as pt demonstrates decrease in L shoulder ROM and RTC muscle weakness. She is operating well below PLOF for objective measures and has significant difficulty completing home ADLs. Pt would benefit from skilled PT services working on improving L periscapular and RTC strength in order to decrease pain and improve function.   OBJECTIVE IMPAIRMENTS: decreased activity tolerance, decreased endurance, decreased mobility, decreased ROM, decreased strength, impaired UE functional use, and pain.   ACTIVITY LIMITATIONS: carrying, lifting, bathing, dressing, and reach over head  PARTICIPATION LIMITATIONS: meal prep, cleaning, driving, shopping, community activity, and yard work  PERSONAL FACTORS: Time since onset of injury/illness/exacerbation and 3+ comorbidities: CVA, DMII, HTN  are also affecting patient's functional outcome.   REHAB POTENTIAL: Good  CLINICAL DECISION MAKING: Evolving/moderate complexity  EVALUATION COMPLEXITY: Moderate   GOALS: Goals reviewed with patient? No  SHORT TERM GOALS: Target date: 05/23/2023   Pt will be compliant and knowledgeable with initial HEP for improved comfort and carryover Baseline: initial HEP given   Goal status: INITIAL  2.  Pt will self report left shoulder pain no greater than 7/10 for improved comfort and functional ability Baseline: 10/10 at worst Goal status: INITIAL   LONG TERM GOALS: Target date: 06/28/2023   Pt will improve FOTO function score to no less than predicted value as proxy for functional improvement Baseline: deferred due to time Goal status: INITIAL   2.  Pt will self report left shoulder pain no greater than 3/10 for improved comfort and functional ability Baseline: 10/10 at worst Goal status: INITIAL    3.  Pt will improve L shoulder flexion to no less than 140 degrees for improved functional ability with OH reaching into cabinets and other home ADLs Baseline: 105 Goal status: INITIAL  4.  Pt will improve L shoulder IR/ER strength through MMT to no less than 4/5 for improved comfort and dynamic stability with OH reaching Baseline: see MMT chart Goal status: INITIAL  PLAN:  PT FREQUENCY: 1x/week  PT DURATION: 8 weeks  PLANNED INTERVENTIONS: 97164- PT Re-evaluation, 97110-Therapeutic exercises, 97530- Therapeutic activity, 97112- Neuromuscular re-education, 97535- Self Care, 81191- Manual therapy, 97014- Electrical stimulation (unattended), [id number]- Electrical stimulation (manual), Dry Needling, Cryotherapy, and Moist heat  PLAN FOR NEXT SESSION:  assess HEP response, progressive periscapular/RTC strenghtening   Jannette Spanner, PTA 05/17/23 3:28 PM Phone: [phone] Fax: [phone]

## 2023-05-18 ENCOUNTER — Ambulatory Visit (INDEPENDENT_AMBULATORY_CARE_PROVIDER_SITE_OTHER): Payer: Medicaid Other | Admitting: Dermatology

## 2023-05-18 DIAGNOSIS — S01401D Unspecified open wound of right cheek and temporomandibular area, subsequent encounter: Secondary | ICD-10-CM | POA: Diagnosis not present

## 2023-05-18 DIAGNOSIS — T148XXD Other injury of unspecified body region, subsequent encounter: Secondary | ICD-10-CM

## 2023-05-18 NOTE — Progress Notes (Signed)
   Follow-Up Visit   Subjective  Julie Robertson is a 47 y.o. female who presents for the following: Bleeding from previous bx  Patient present today for follow up visit for bleeding from previous bx site. Patient was last evaluated on 05/11/23. Patient reports sxs are unchanged. Patient denies medication changes.  The following portions of the chart were reviewed this encounter and updated as appropriate: medications, allergies, medical history  Review of Systems:  No other skin or systemic complaints except as noted in HPI or Assessment and Plan.  The patient, Julie Robertson, presented for an emergency appointment due to persistent bleeding from a recent procedure. She reported that the bleeding had stopped at the time of the phone call with the nurse, but her daughter had called to schedule the appointment. Julie Robertson mentioned that she had stopped taking Plavix a week ago, as instructed by her cardiologist. She is scheduled for an endoscopy on the 21st.  Julie Robertson also reported that she has been careful not to disturb the area while washing her face and has been applying Aquaphor and a bandage as instructed. She expressed concern about a mole in her armpit, fearing that she might accidentally cut it while shaving. However, the focus of this visit was on addressing the bleeding issue.  Objective  Well appearing patient in no apparent distress; mood and affect are within normal limits.   A focused examination was performed of the following areas: Right Cheek  Relevant exam findings are noted in the Assessment and Plan.    Assessment & Plan    1. Delayed Wound healing (Bleeding from recent skin lesion removal) - Assessment: Patient with a history of persistent bleeding from the site of a recent skin lesion removal.  - Plan: Apply local anesthetic and perform electrocautery to cauterize blood vessels and prevent further bleeding. Instruct patient to apply Aquaphor twice daily  and cover with a Band-Aid for 2 weeks to aid healing and reduce scarring.      Wound healing, delayed    No follow-ups on file.    Documentation: I have reviewed the above documentation for accuracy and completeness, and I agree with the above.  Langston Reusing, DO

## 2023-05-18 NOTE — Patient Instructions (Addendum)
Patient Handout: Wound Care   Taking Care of Your Skin Biopsy Site  Proper care of the biopsy site is essential for promoting healing and minimizing scarring. This handout provides instructions on how to care for your biopsy site to ensure optimal recovery.  1. Cleaning the Wound:  Clean the biopsy site daily with gentle soap and water. Gently pat the area dry with a clean, soft towel. Avoid harsh scrubbing or rubbing the area, as this can irritate the skin and delay healing.  2. Applying Aquaphor and Bandage:  After cleaning the wound, apply a thin layer of Aquaphor ointment to the biopsy site. Cover the area with a sterile bandage to protect it from dirt, bacteria, and friction. Change the bandage daily or as needed if it becomes soiled or wet.  3. Continued Care for One Week:  Repeat the cleaning, Aquaphor application, and bandaging process daily for one week following the biopsy procedure. Keeping the wound clean and moist during this initial healing period will help prevent infection and promote optimal healing.  4. Massaging Aquaphor into the Area:  ---After one week, discontinue the use of bandages but continue to apply Aquaphor to the biopsy site. ----Gently massage the Aquaphor into the area using circular motions. ---Massaging the skin helps to promote circulation and prevent the formation of scar tissue.   Additional Tips:  Avoid exposing the biopsy site to direct sunlight during the healing process, as this can cause hyperpigmentation or worsen scarring. If you experience any signs of infection, such as increased redness, swelling, warmth, or drainage from the wound, contact your healthcare provider immediately. Follow any additional instructions provided by your healthcare provider for caring for the biopsy site and managing any discomfort. Conclusion:  Taking proper care of your skin biopsy site is crucial for ensuring optimal healing and minimizing scarring. By  following these instructions for cleaning, applying Aquaphor, and massaging the area, you can promote a smooth and successful recovery. If you have any questions or concerns about caring for your biopsy site, don't hesitate to contact your healthcare provider for guidance.    Important Information   Due to recent changes in healthcare laws, you may see results of your pathology and/or laboratory studies on MyChart before the doctors have had a chance to review them. We understand that in some cases there may be results that are confusing or concerning to you. Please understand that not all results are received at the same time and often the doctors may need to interpret multiple results in order to provide you with the best plan of care or course of treatment. Therefore, we ask that you please give [place] 2 business days to thoroughly review all your results before contacting the office for clarification. Should we see a critical lab result, you will be contacted sooner.     If You Need Anything After Your Visit   If you have any questions or concerns for your doctor, please call our main line at [phone]. If no one answers, please leave a voicemail as directed and we will return your call as soon as possible. Messages left after 4 pm will be answered the following business day.    You may also send [place] a message via MyChart. We typically respond to MyChart messages within 1-2 business days.  For prescription refills, please ask your pharmacy to contact our office. Our fax number is [phone].  If you have an urgent issue when the clinic is closed that cannot wait until the  next business day, you can page your doctor at the number below.     Please note that while we do our best to be available for urgent issues outside of office hours, we are not available 24/7.    If you have an urgent issue and are unable to reach [place], you may choose to seek medical care at your doctor's office, retail clinic,  urgent care center, or emergency room.   If you have a medical emergency, please immediately call 911 or go to the emergency department. In the event of inclement weather, please call our main line at [phone] for an update on the status of any delays or closures.  Dermatology Medication Tips: Please keep the boxes that topical medications come in in order to help keep track of the instructions about where and how to use these. Pharmacies typically print the medication instructions only on the boxes and not directly on the medication tubes.   If your medication is too expensive, please contact our office at [phone] or send [place] a message through MyChart.    We are unable to tell what your co-pay for medications will be in advance as this is different depending on your insurance coverage. However, we may be able to find a substitute medication at lower cost or fill out paperwork to get insurance to cover a needed medication.    If a prior authorization is required to get your medication covered by your insurance company, please allow [place] 1-2 business days to complete this process.   Drug prices often vary depending on where the prescription is filled and some pharmacies may offer cheaper prices.   The website www.goodrx.com contains coupons for medications through different pharmacies. The prices here do not account for what the cost may be with help from insurance (it may be cheaper with your insurance), but the website can give you the price if you did not use any insurance.  - You can print the associated coupon and take it with your prescription to the pharmacy.  - You may also stop by our office during regular business hours and pick up a GoodRx coupon card.  - If you need your prescription sent electronically to a different pharmacy, notify our office through [facility] or by phone at [phone]

## 2023-05-23 ENCOUNTER — Other Ambulatory Visit: Payer: Self-pay

## 2023-05-24 ENCOUNTER — Other Ambulatory Visit: Payer: Self-pay | Admitting: Family Medicine

## 2023-05-24 ENCOUNTER — Other Ambulatory Visit: Payer: Self-pay | Admitting: Pharmacist

## 2023-05-24 ENCOUNTER — Ambulatory Visit: Payer: Medicaid Other

## 2023-05-24 ENCOUNTER — Other Ambulatory Visit: Payer: Self-pay

## 2023-05-24 DIAGNOSIS — M6281 Muscle weakness (generalized): Secondary | ICD-10-CM

## 2023-05-24 DIAGNOSIS — Z794 Long term (current) use of insulin: Secondary | ICD-10-CM

## 2023-05-24 DIAGNOSIS — G8929 Other chronic pain: Secondary | ICD-10-CM

## 2023-05-24 DIAGNOSIS — R293 Abnormal posture: Secondary | ICD-10-CM

## 2023-05-24 DIAGNOSIS — M25512 Pain in left shoulder: Secondary | ICD-10-CM | POA: Diagnosis not present

## 2023-05-24 MED ORDER — SEMAGLUTIDE (2 MG/DOSE) 8 MG/3ML ~~LOC~~ SOPN
2.0000 mg | PEN_INJECTOR | SUBCUTANEOUS | 6 refills | Status: DC
  Filled 2023-05-24: qty 3, 28d supply, fill #0
  Filled 2023-06-14: qty 3, 28d supply, fill #1
  Filled 2023-07-17 (×2): qty 3, 28d supply, fill #2
  Filled 2023-08-21: qty 3, 28d supply, fill #3
  Filled 2023-09-12: qty 3, 28d supply, fill #4
  Filled 2023-10-26: qty 3, 28d supply, fill #5

## 2023-05-24 NOTE — Therapy (Signed)
OUTPATIENT PHYSICAL THERAPY SHOULDER EVALUATION   Patient Name: Julie Robertson MRN: [id number] DOB:07/24/1975, 47 y.o., female Today's Date: 05/24/2023  END OF SESSION:  PT End of Session - 05/24/23 1455     Visit Number 3    Number of Visits 9    Date for PT Re-Evaluation 06/28/23    Authorization Type [place] [employer]    PT Start Time 1455   arrived late   PT Stop Time 1528    PT Time Calculation (min) 33 min              Past Medical History:  Diagnosis Date   Anxiety    Asthma    Coronary artery disease    CVA (cerebral vascular accident) (HCC)    Diabetes mellitus without complication (HCC)    Diabetic neuropathy (HCC) 12/2013   vs carpal tunnell.  numbness tingling in right fingers. rx with Gabapentin.   Dyslipidemia 08/2009   Dyspnea    Gall stones 08/2009   GERD (gastroesophageal reflux disease)    H/O tubal ligation    Obesity    BMI 41, 250# 03/2015   Past Surgical History:  Procedure Laterality Date   APPLICATION OF WOUND VAC N/A 05/09/2019   Procedure: APPLICATION OF WOUND VAC;  Surgeon: Henrene Dodge, MD;  Location: [facility] ORS;  Service: General;  Laterality: N/A;  [id number]   BUBBLE STUDY  12/24/2021   Procedure: BUBBLE STUDY;  Surgeon: Lewayne Bunting, MD;  Location: [facility] ENDOSCOPY;  Service: Cardiovascular;;   CHOLECYSTECTOMY N/A 03/10/2015   Procedure: LAPAROSCOPIC CHOLECYSTECTOMY WITH INTRAOPERATIVE CHOLANGIOGRAM;  Surgeon: Violeta Gelinas, MD;  Location: [facility] OR;  Service: General;  Laterality: N/A;   CORONARY BALLOON ANGIOPLASTY N/A 08/09/2022   Procedure: CORONARY BALLOON ANGIOPLASTY;  Surgeon: Yates Decamp, MD;  Location: [facility] INVASIVE CV LAB;  Service: Cardiovascular;  Laterality: N/A;   ERCP N/A 03/11/2015   Procedure: ENDOSCOPIC RETROGRADE CHOLANGIOPANCREATOGRAPHY (ERCP);  Surgeon: Rachael Fee, MD;  Location: [facility] ENDOSCOPY;  Service: Endoscopy;  Laterality: N/A;   ESOPHAGOGASTRODUODENOSCOPY (EGD) WITH PROPOFOL N/A 07/25/2018   Procedure:  ESOPHAGOGASTRODUODENOSCOPY (EGD) WITH PROPOFOL;  Surgeon: Meridee Score Netty Starring., MD;  Location: [facility] ENDOSCOPY;  Service: Gastroenterology;  Laterality: N/A;   INSERTION OF MESH N/A 05/09/2019   Procedure: INSERTION OF MESH;  Surgeon: Henrene Dodge, MD;  Location: [facility] ORS;  Service: General;  Laterality: N/A;   LEFT HEART CATH AND CORONARY ANGIOGRAPHY N/A 08/09/2022   Procedure: LEFT HEART CATH AND CORONARY ANGIOGRAPHY;  Surgeon: Yates Decamp, MD;  Location: [facility] INVASIVE CV LAB;  Service: Cardiovascular;  Laterality: N/A;   TEE WITHOUT CARDIOVERSION N/A 12/24/2021   Procedure: TRANSESOPHAGEAL ECHOCARDIOGRAM (TEE);  Surgeon: Lewayne Bunting, MD;  Location: [facility] ENDOSCOPY;  Service: Cardiovascular;  Laterality: N/A;   Transduodenal Ampullectomy  09/2015   TUBAL LIGATION     VENTRAL HERNIA REPAIR N/A 05/09/2019   Procedure: HERNIA REPAIR VENTRAL ADULT with MESH;  Surgeon: Henrene Dodge, MD;  Location: [facility] ORS;  Service: General;  Laterality: N/A;   Patient Active Problem List   Diagnosis Date Noted   Chronic constipation 05/04/2023   Atherosclerosis of native coronary artery of native heart with stable angina pectoris (HCC) 08/10/2022   Mixed hyperlipidemia 08/10/2022   Hypertriglyceridemia 08/10/2022   History of stroke 08/10/2022   Class 3 severe obesity due to excess calories with serious comorbidity and body mass index (BMI) of 40.0 to 44.9 in adult [facility]) 08/10/2022   PFO (patent foramen ovale) 08/10/2022   Post PTCA 08/09/2022  PFO with atrial septal aneurysm 12/24/2021   Acute ischemic stroke (HCC) 12/21/2021   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   Acute pulmonary edema (HCC) 09/06/2020   Incisional hernia, without obstruction or gangrene 05/09/2019   Uncontrolled type 2 diabetes mellitus with hyperglycemia, with long-term current use of insulin (HCC) 04/12/2017   Hypertension associated with diabetes (HCC) 04/12/2017   Morbid (severe) obesity due to excess calories (HCC)  10/18/2016   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   Iron deficiency anemia 09/02/2015   Anemia 09/01/2015   Asthma 08/28/2015   Type 2 diabetes mellitus (HCC) 07/31/2015   Dyslipidemia 12/04/2013   Numbness and tingling in right hand 12/04/2013   Smoking 12/04/2013   Elevated BP 12/04/2013   Pap smear, high-risk (screening, no prior abnormality) 02/13/2013   Exposure to STD 02/13/2013    PCP: Hoy Register, MD  REFERRING PROVIDER: Adonis Huguenin, NP   REFERRING DIAG: [phone] (ICD-10-CM) - Acute pain of left shoulder due to trauma   THERAPY DIAG:  Chronic left shoulder pain  Muscle weakness (generalized)  Abnormal posture  Rationale for Evaluation and Treatment: Rehabilitation  ONSET DATE: Chronic  SUBJECTIVE:                                                                                                                                                                                      SUBJECTIVE STATEMENT: Pt presents to PT with reports of decreased pain. Has been compliant with HEP.  Pt presents to PT with reports of chronic L shoulder pain. Denies trauma or MOI, notes that she woke up and felt this pain. Orthopedics noted pt with lifting injury several months ago and feeling a "pop" in L shoulder. Has some instances of N/T in L UE, she is concerned that this was because of previous CVA, however, these symptoms are fairly new. She wants to have bariatric surgery, is waiting to see if she is an approved candidate.   Hand dominance: Right  PERTINENT HISTORY: CVA, DMII, HTN  PAIN:  Are you having pain?  Yes: NPRS scale: 4/10 after pain meds (9/10 before)  Worst: 10/10 Pain location: L shoulder Pain description: sharp Aggravating factors: OH reaching, lifting, laying on L side Relieving factors: rest, medication  PRECAUTIONS: None  RED FLAGS: None   WEIGHT BEARING RESTRICTIONS: No  FALLS:  Has patient fallen in last 6 months? No  LIVING  ENVIRONMENT: Lives with: lives with their family Lives in: House/apartment  OCCUPATION: Not currently working  PLOF: Independent  PATIENT GOALS: decrease her shoulder pain, improve her comfort with dressing and other ADLs  OBJECTIVE:  Note: Objective measures were completed at Evaluation unless otherwise noted.  DIAGNOSTIC FINDINGS:  See imaging for MRI results  PATIENT SURVEYS:  FOTO: deferred due to time FOTO 05/17/23: 42 intake, 60 predicted (read by in person interpreter- spanish version)  COGNITION: Overall cognitive status: Within functional limits for tasks assessed     SENSATION: WFL  POSTURE: Rounded shoulders, fwd head  UPPER EXTREMITY ROM:   Active ROM Right eval Left eval LEFT 05/17/23  Shoulder flexion WFL 105 115  Shoulder extension     Shoulder abduction WFL 80 95  Shoulder adduction     Shoulder internal rotation T8 L4   Shoulder external rotation     Elbow flexion     Elbow extension     Wrist flexion     Wrist extension     Wrist ulnar deviation     Wrist radial deviation     Wrist pronation     Wrist supination     (Blank rows = not tested)  UPPER EXTREMITY MMT:  MMT Right eval Left eval  Shoulder flexion    Shoulder extension    Shoulder abduction    Shoulder adduction    Shoulder internal rotation 5 3+  Shoulder external rotation 5 3 with pain  Middle trapezius    Lower trapezius    Elbow flexion    Elbow extension    Wrist flexion    Wrist extension    Wrist ulnar deviation    Wrist radial deviation    Wrist pronation    Wrist supination    Grip strength (lbs)    (Blank rows = not tested)  SHOULDER SPECIAL TESTS: Impingement tests: Painful arc test: positive  SLAP lesions: DNT Instability tests: DNT Rotator cuff assessment: DNT Biceps assessment: DNT  JOINT MOBILITY TESTING:  GH hypomobility L  PALPATION:  TTP to L infraspinatus, L upper   TREATMENT: [facility] Adult PT Treatment:                                                 DATE:  05/24/23 Therapeutic Exercise: UBE lvl 1.0 x 2 min fwd while taking subjective Pulleys shoulder flexion x 60" Row 2x10 GTB Shoulder ext 2x10 GTB L shoulder ER iso x 10 - 5" hold L shoulder ER 2x10 RTB Supine horizontal abd 3x10 RTB Supine dow flexion 2x10  Shoulder ER/IR isometric review Shoulder wall slide Shoulder flexion walk back from counter- back pain Pulleys for shoulder flexion Supine chest press Supine pullover from chest height Supine YTB ER bilat x 10  [facility] Adult PT Treatment:                                                DATE:  05/17/23 Therapeutic Exercise: Shoulder ER/IR isometric review Shoulder wall slide Shoulder flexion walk back from counter- back pain Pulleys for shoulder flexion Supine chest press Supine pullover from chest height Supine YTB ER bilat x 10  [facility] Adult PT Treatment:                                                DATE: 05/02/2023 Therapeutic Exercise: L upper trap stretch x  30"  Seated scapular retraction x 5  L shoulder IR/ER isometric x 5 - 50% force  PATIENT EDUCATION: Education details: eval findings, FOTO, HEP, POC Person educated: Patient Education method: Explanation, Demonstration, and Handouts Education comprehension: verbalized understanding and returned demonstration  HOME EXERCISE PROGRAM: Access Code: [email or web address] URL: [email or web address] Date: 05/24/2023 Prepared by: Edwinna Areola  Exercises - Seated Upper Trapezius Stretch (Mirrored)  - 1 x daily - 7 x weekly - 2 reps - 30 sec hold - Seated Scapular Retraction  - 1 x daily - 7 x weekly - 2 sets - 10 reps - 3 sec hold - Standing Isometric Shoulder Internal Rotation with Towel Roll at Doorway  - 1 x daily - 7 x weekly - 2 sets - 10 reps - 3 sec hold - Standing Isometric Shoulder External Rotation with Doorway and Towel Roll  - 1 x daily - 7 x weekly - 2 sets - 10 reps - 3 sec hold - Supine shoulder Flexion Extension AAROM with Dowel  to comfortable height overhead  - 3 x daily - 7 x weekly - 2 sets - 10 reps - Shoulder Flexion Wall Slide with Towel  - 3 x daily - 7 x weekly - 2 sets - 10 reps - Supine shoulder external rotation- KNEES BENT  - 3 x daily - 7 x weekly - 2 sets - 10 reps - Shoulder External Rotation with Anchored Resistance  - 1 x daily - 7 x weekly - 2 sets - 10 reps - rojo hold  ASSESSMENT:  CLINICAL IMPRESSION: Pt was able to complete prescribed exercises with no adverse effect. Therapy today focused on periscapular and RTC strengthening for decreasing shoulder pain. HEP updated for ER strengthening. Pt is progressing as expected with therapy, will continue per POC.   EVAL; Patient is a 47 y.o. F who was seen today for physical therapy evaluation and treatment for chronic L shoulder pain. Physical findings are consistent with orthopedics referral and MRI results as pt demonstrates decrease in L shoulder ROM and RTC muscle weakness. She is operating well below PLOF for objective measures and has significant difficulty completing home ADLs. Pt would benefit from skilled PT services working on improving L periscapular and RTC strength in order to decrease pain and improve function.   OBJECTIVE IMPAIRMENTS: decreased activity tolerance, decreased endurance, decreased mobility, decreased ROM, decreased strength, impaired UE functional use, and pain.   ACTIVITY LIMITATIONS: carrying, lifting, bathing, dressing, and reach over head  PARTICIPATION LIMITATIONS: meal prep, cleaning, driving, shopping, community activity, and yard work  PERSONAL FACTORS: Time since onset of injury/illness/exacerbation and 3+ comorbidities: CVA, DMII, HTN  are also affecting patient's functional outcome.   REHAB POTENTIAL: Good  CLINICAL DECISION MAKING: Evolving/moderate complexity  EVALUATION COMPLEXITY: Moderate   GOALS: Goals reviewed with patient? No  SHORT TERM GOALS: Target date: 05/23/2023   Pt will be compliant and  knowledgeable with initial HEP for improved comfort and carryover Baseline: initial HEP given   Goal status: INITIAL  2.  Pt will self report left shoulder pain no greater than 7/10 for improved comfort and functional ability Baseline: 10/10 at worst Goal status: INITIAL   LONG TERM GOALS: Target date: 06/28/2023   Pt will improve FOTO function score to no less than predicted value as proxy for functional improvement Baseline: deferred due to time Goal status: INITIAL   2.  Pt will self report left shoulder pain no greater than 3/10 for improved comfort and functional ability Baseline: 10/10 at  worst Goal status: INITIAL   3.  Pt will improve L shoulder flexion to no less than 140 degrees for improved functional ability with OH reaching into cabinets and other home ADLs Baseline: 105 Goal status: INITIAL  4.  Pt will improve L shoulder IR/ER strength through MMT to no less than 4/5 for improved comfort and dynamic stability with OH reaching Baseline: see MMT chart Goal status: INITIAL  PLAN:  PT FREQUENCY: 1x/week  PT DURATION: 8 weeks  PLANNED INTERVENTIONS: 97164- PT Re-evaluation, 97110-Therapeutic exercises, 97530- Therapeutic activity, 97112- Neuromuscular re-education, 97535- Self Care, 44034- Manual therapy, 97014- Electrical stimulation (unattended), [id number]- Electrical stimulation (manual), Dry Needling, Cryotherapy, and Moist heat  PLAN FOR NEXT SESSION:  assess HEP response, progressive periscapular/RTC strenghtening   Eloy End PT  05/24/23 4:17 PM

## 2023-05-25 ENCOUNTER — Other Ambulatory Visit: Payer: Self-pay

## 2023-05-26 ENCOUNTER — Other Ambulatory Visit: Payer: Self-pay

## 2023-05-26 MED ORDER — PREDNISOLONE ACETATE 1 % OP SUSP
OPHTHALMIC | 1 refills | Status: DC
Start: 2023-05-26 — End: 2023-08-15
  Filled 2023-05-26: qty 5, 34d supply, fill #0
  Filled 2023-07-17: qty 5, 34d supply, fill #1

## 2023-05-29 ENCOUNTER — Other Ambulatory Visit: Payer: Self-pay

## 2023-05-31 ENCOUNTER — Ambulatory Visit: Payer: Medicaid Other

## 2023-05-31 DIAGNOSIS — M25512 Pain in left shoulder: Secondary | ICD-10-CM | POA: Diagnosis not present

## 2023-05-31 DIAGNOSIS — G8929 Other chronic pain: Secondary | ICD-10-CM

## 2023-05-31 DIAGNOSIS — M6281 Muscle weakness (generalized): Secondary | ICD-10-CM

## 2023-05-31 DIAGNOSIS — R293 Abnormal posture: Secondary | ICD-10-CM

## 2023-05-31 NOTE — Therapy (Signed)
OUTPATIENT PHYSICAL THERAPY SHOULDER EVALUATION   Patient Name: Julie Robertson MRN: [id number] DOB:08-16-1975, 46 y.o., female Today's Date: 05/31/2023  END OF SESSION:  PT End of Session - 05/31/23 1457     Visit Number 4    Number of Visits 9    Date for PT Re-Evaluation 06/28/23    Authorization Type [employer]    PT Start Time 1457   arrived late   PT Stop Time 1528    PT Time Calculation (min) 31 min               Past Medical History:  Diagnosis Date   Anxiety    Asthma    Coronary artery disease    CVA (cerebral vascular accident) (HCC)    Diabetes mellitus without complication (HCC)    Diabetic neuropathy (HCC) 12/2013   vs carpal tunnell.  numbness tingling in right fingers. rx with Gabapentin.   Dyslipidemia 08/2009   Dyspnea    Gall stones 08/2009   GERD (gastroesophageal reflux disease)    H/O tubal ligation    Obesity    BMI 41, 250# 03/2015   Past Surgical History:  Procedure Laterality Date   APPLICATION OF WOUND VAC N/A 05/09/2019   Procedure: APPLICATION OF WOUND VAC;  Surgeon: Henrene Dodge, MD;  Location: [facility] ORS;  Service: General;  Laterality: N/A;  [id number]   BUBBLE STUDY  12/24/2021   Procedure: BUBBLE STUDY;  Surgeon: Lewayne Bunting, MD;  Location: [facility] ENDOSCOPY;  Service: Cardiovascular;;   CHOLECYSTECTOMY N/A 03/10/2015   Procedure: LAPAROSCOPIC CHOLECYSTECTOMY WITH INTRAOPERATIVE CHOLANGIOGRAM;  Surgeon: Violeta Gelinas, MD;  Location: [facility] OR;  Service: General;  Laterality: N/A;   CORONARY BALLOON ANGIOPLASTY N/A 08/09/2022   Procedure: CORONARY BALLOON ANGIOPLASTY;  Surgeon: Yates Decamp, MD;  Location: [facility] INVASIVE CV LAB;  Service: Cardiovascular;  Laterality: N/A;   ERCP N/A 03/11/2015   Procedure: ENDOSCOPIC RETROGRADE CHOLANGIOPANCREATOGRAPHY (ERCP);  Surgeon: Rachael Fee, MD;  Location: [facility] ENDOSCOPY;  Service: Endoscopy;  Laterality: N/A;   ESOPHAGOGASTRODUODENOSCOPY (EGD) WITH PROPOFOL N/A 07/25/2018   Procedure:  ESOPHAGOGASTRODUODENOSCOPY (EGD) WITH PROPOFOL;  Surgeon: Meridee Score Netty Starring., MD;  Location: [facility] ENDOSCOPY;  Service: Gastroenterology;  Laterality: N/A;   INSERTION OF MESH N/A 05/09/2019   Procedure: INSERTION OF MESH;  Surgeon: Henrene Dodge, MD;  Location: [facility] ORS;  Service: General;  Laterality: N/A;   LEFT HEART CATH AND CORONARY ANGIOGRAPHY N/A 08/09/2022   Procedure: LEFT HEART CATH AND CORONARY ANGIOGRAPHY;  Surgeon: Yates Decamp, MD;  Location: [facility] INVASIVE CV LAB;  Service: Cardiovascular;  Laterality: N/A;   TEE WITHOUT CARDIOVERSION N/A 12/24/2021   Procedure: TRANSESOPHAGEAL ECHOCARDIOGRAM (TEE);  Surgeon: Lewayne Bunting, MD;  Location: [facility] ENDOSCOPY;  Service: Cardiovascular;  Laterality: N/A;   Transduodenal Ampullectomy  09/2015   TUBAL LIGATION     VENTRAL HERNIA REPAIR N/A 05/09/2019   Procedure: HERNIA REPAIR VENTRAL ADULT with MESH;  Surgeon: Henrene Dodge, MD;  Location: [facility] ORS;  Service: General;  Laterality: N/A;   Patient Active Problem List   Diagnosis Date Noted   Chronic constipation 05/04/2023   Atherosclerosis of native coronary artery of native heart with stable angina pectoris (HCC) 08/10/2022   Mixed hyperlipidemia 08/10/2022   Hypertriglyceridemia 08/10/2022   History of stroke 08/10/2022   Class 3 severe obesity due to excess calories with serious comorbidity and body mass index (BMI) of 40.0 to 44.9 in adult [facility]) 08/10/2022   PFO (patent foramen ovale) 08/10/2022   Post PTCA 08/09/2022  PFO with atrial septal aneurysm 12/24/2021   Acute ischemic stroke (HCC) 12/21/2021   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   Acute pulmonary edema (HCC) 09/06/2020   Incisional hernia, without obstruction or gangrene 05/09/2019   Uncontrolled type 2 diabetes mellitus with hyperglycemia, with long-term current use of insulin (HCC) 04/12/2017   Hypertension associated with diabetes (HCC) 04/12/2017   Morbid (severe) obesity due to excess calories (HCC)  10/18/2016   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   Iron deficiency anemia 09/02/2015   Anemia 09/01/2015   Asthma 08/28/2015   Type 2 diabetes mellitus (HCC) 07/31/2015   Dyslipidemia 12/04/2013   Numbness and tingling in right hand 12/04/2013   Smoking 12/04/2013   Elevated BP 12/04/2013   Pap smear, high-risk (screening, no prior abnormality) 02/13/2013   Exposure to STD 02/13/2013    PCP: Hoy Register, MD  REFERRING PROVIDER: Adonis Huguenin, NP   REFERRING DIAG: [id number] (ICD-10-CM) - Acute pain of left shoulder due to trauma   THERAPY DIAG:  Chronic left shoulder pain  Muscle weakness (generalized)  Abnormal posture  Rationale for Evaluation and Treatment: Rehabilitation  ONSET DATE: Chronic  SUBJECTIVE:                                                                                                                                                                                      SUBJECTIVE STATEMENT: Pt presents to PT with reports of decreased pain. Has been compliant with HEP and feels like her shoulder is getting better. Will be having bariatric surgery on 12/11, we will make next visit her last with PT.   Hand dominance: Right  PERTINENT HISTORY: CVA, DMII, HTN  PAIN:  Are you having pain?  Yes: NPRS scale: 4/10 after pain meds (9/10 before)  Worst: 10/10 Pain location: L shoulder Pain description: sharp Aggravating factors: OH reaching, lifting, laying on L side Relieving factors: rest, medication  PRECAUTIONS: None  RED FLAGS: None   WEIGHT BEARING RESTRICTIONS: No  FALLS:  Has patient fallen in last 6 months? No  LIVING ENVIRONMENT: Lives with: lives with their family Lives in: House/apartment  OCCUPATION: Not currently working  PLOF: Independent  PATIENT GOALS: decrease her shoulder pain, improve her comfort with dressing and other ADLs  OBJECTIVE:  Note: Objective measures were completed at Evaluation unless otherwise  noted.  DIAGNOSTIC FINDINGS:  See imaging for MRI results  PATIENT SURVEYS:  FOTO: deferred due to time FOTO 05/17/23: 42 intake, 60 predicted (read by in person interpreter- spanish version)  COGNITION: Overall cognitive status: Within functional limits for tasks assessed     SENSATION: WFL  POSTURE: Rounded shoulders, fwd head  UPPER EXTREMITY  ROM:   Active ROM Right eval Left eval LEFT 05/17/23  Shoulder flexion WFL 105 115  Shoulder extension     Shoulder abduction WFL 80 95  Shoulder adduction     Shoulder internal rotation T8 L4   Shoulder external rotation     Elbow flexion     Elbow extension     Wrist flexion     Wrist extension     Wrist ulnar deviation     Wrist radial deviation     Wrist pronation     Wrist supination     (Blank rows = not tested)  UPPER EXTREMITY MMT:  MMT Right eval Left eval  Shoulder flexion    Shoulder extension    Shoulder abduction    Shoulder adduction    Shoulder internal rotation 5 3+  Shoulder external rotation 5 3 with pain  Middle trapezius    Lower trapezius    Elbow flexion    Elbow extension    Wrist flexion    Wrist extension    Wrist ulnar deviation    Wrist radial deviation    Wrist pronation    Wrist supination    Grip strength (lbs)    (Blank rows = not tested)  SHOULDER SPECIAL TESTS: Impingement tests: Painful arc test: positive  SLAP lesions: DNT Instability tests: DNT Rotator cuff assessment: DNT Biceps assessment: DNT  JOINT MOBILITY TESTING:  GH hypomobility L  PALPATION:  TTP to L infraspinatus, L upper   TREATMENT: [facility] Adult PT Treatment:                                                DATE:  05/31/23 Therapeutic Exercise: Pulleys shoulder flexion x 60" Row 2x10 GTB Shoulder ext 2x10 GTB L shoulder ER 2x10 YTB Supine horizontal abd 2x15 GTB Supine dow flexion x 10 Supine chest press 2x10 Physioball shoulder flexion on incline x 15 L Seated low row 3x10 25#  [facility] Adult  PT Treatment:                                                DATE:  05/24/23 Therapeutic Exercise: UBE lvl 1.0 x 2 min fwd while taking subjective Pulleys shoulder flexion x 60" Row 2x10 GTB Shoulder ext 2x10 GTB L shoulder ER iso x 10 - 5" hold L shoulder ER 2x10 RTB Supine horizontal abd 3x10 RTB Supine dow flexion 2x10  [facility] Adult PT Treatment:                                                DATE:  05/17/23 Therapeutic Exercise: Shoulder ER/IR isometric review Shoulder wall slide Shoulder flexion walk back from counter- back pain Pulleys for shoulder flexion Supine chest press Supine pullover from chest height Supine YTB ER bilat x 10  [facility] Adult PT Treatment:  DATE: 05/02/2023 Therapeutic Exercise: L upper trap stretch x 30"  Seated scapular retraction x 5  L shoulder IR/ER isometric x 5 - 50% force  PATIENT EDUCATION: Education details: continue HEP Person educated: Patient Education method: Explanation, Demonstration, and Handouts Education comprehension: verbalized understanding and returned demonstration  HOME EXERCISE PROGRAM: Access Code: [email or web address] URL: [email or web address] Date: 05/24/2023 Prepared by: Edwinna Areola  Exercises - Seated Upper Trapezius Stretch (Mirrored)  - 1 x daily - 7 x weekly - 2 reps - 30 sec hold - Seated Scapular Retraction  - 1 x daily - 7 x weekly - 2 sets - 10 reps - 3 sec hold - Standing Isometric Shoulder Internal Rotation with Towel Roll at Doorway  - 1 x daily - 7 x weekly - 2 sets - 10 reps - 3 sec hold - Standing Isometric Shoulder External Rotation with Doorway and Towel Roll  - 1 x daily - 7 x weekly - 2 sets - 10 reps - 3 sec hold - Supine shoulder Flexion Extension AAROM with Dowel to comfortable height overhead  - 3 x daily - 7 x weekly - 2 sets - 10 reps - Shoulder Flexion Wall Slide with Towel  - 3 x daily - 7 x weekly - 2 sets - 10 reps - Supine shoulder  external rotation- KNEES BENT  - 3 x daily - 7 x weekly - 2 sets - 10 reps - Shoulder External Rotation with Anchored Resistance  - 1 x daily - 7 x weekly - 2 sets - 10 reps - rojo hold  ASSESSMENT:  CLINICAL IMPRESSION: Pt was able to complete prescribed exercises with no adverse effect. Therapy today focused on periscapular and RTC strengthening for decreasing shoulder pain. Pt has progressed well with therapy. Will assess goals and create final HEP at next session in anticipation of discharge due to upcoming bariatric surgery.   EVAL; Patient is a 47 y.o. F who was seen today for physical therapy evaluation and treatment for chronic L shoulder pain. Physical findings are consistent with orthopedics referral and MRI results as pt demonstrates decrease in L shoulder ROM and RTC muscle weakness. She is operating well below PLOF for objective measures and has significant difficulty completing home ADLs. Pt would benefit from skilled PT services working on improving L periscapular and RTC strength in order to decrease pain and improve function.   OBJECTIVE IMPAIRMENTS: decreased activity tolerance, decreased endurance, decreased mobility, decreased ROM, decreased strength, impaired UE functional use, and pain.   ACTIVITY LIMITATIONS: carrying, lifting, bathing, dressing, and reach over head  PARTICIPATION LIMITATIONS: meal prep, cleaning, driving, shopping, community activity, and yard work  PERSONAL FACTORS: Time since onset of injury/illness/exacerbation and 3+ comorbidities: CVA, DMII, HTN  are also affecting patient's functional outcome.   REHAB POTENTIAL: Good  CLINICAL DECISION MAKING: Evolving/moderate complexity  EVALUATION COMPLEXITY: Moderate   GOALS: Goals reviewed with patient? No  SHORT TERM GOALS: Target date: 05/23/2023   Pt will be compliant and knowledgeable with initial HEP for improved comfort and carryover Baseline: initial HEP given   Goal status: INITIAL  2.  Pt  will self report left shoulder pain no greater than 7/10 for improved comfort and functional ability Baseline: 10/10 at worst Goal status: INITIAL   LONG TERM GOALS: Target date: 06/28/2023   Pt will improve FOTO function score to no less than predicted value as proxy for functional improvement Baseline: deferred due to time Goal status: INITIAL   2.  Pt will self report left  shoulder pain no greater than 3/10 for improved comfort and functional ability Baseline: 10/10 at worst Goal status: INITIAL   3.  Pt will improve L shoulder flexion to no less than 140 degrees for improved functional ability with OH reaching into cabinets and other home ADLs Baseline: 105 Goal status: INITIAL  4.  Pt will improve L shoulder IR/ER strength through MMT to no less than 4/5 for improved comfort and dynamic stability with OH reaching Baseline: see MMT chart Goal status: INITIAL  PLAN:  PT FREQUENCY: 1x/week  PT DURATION: 8 weeks  PLANNED INTERVENTIONS: 97164- PT Re-evaluation, 97110-Therapeutic exercises, 97530- Therapeutic activity, 97112- Neuromuscular re-education, 97535- Self Care, [id number]- Manual therapy, 97014- Electrical stimulation (unattended), [id number]- Electrical stimulation (manual), Dry Needling, Cryotherapy, and Moist heat  PLAN FOR NEXT SESSION:  assess HEP response, progressive periscapular/RTC strenghtening   Eloy End PT  05/31/23 3:30 PM

## 2023-06-02 ENCOUNTER — Other Ambulatory Visit: Payer: Self-pay

## 2023-06-02 MED ORDER — ONDANSETRON 8 MG PO TBDP
8.0000 mg | ORAL_TABLET | Freq: Three times a day (TID) | ORAL | 1 refills | Status: DC | PRN
  Filled 2023-06-02: qty 30, 10d supply, fill #0

## 2023-06-02 MED ORDER — METOCLOPRAMIDE HCL 10 MG PO TABS
10.0000 mg | ORAL_TABLET | ORAL | 3 refills | Status: DC
  Filled 2023-06-02: qty 30, 8d supply, fill #0
  Filled 2023-06-14: qty 30, 8d supply, fill #1
  Filled 2023-07-17: qty 30, 8d supply, fill #2

## 2023-06-02 MED ORDER — APREPITANT 40 MG PO CAPS
40.0000 mg | ORAL_CAPSULE | Freq: Once | ORAL | 0 refills | Status: AC
  Filled 2023-06-02: qty 1, 1d supply, fill #0

## 2023-06-02 MED ORDER — LEVOFLOXACIN 500 MG PO TABS
500.0000 mg | ORAL_TABLET | Freq: Once | ORAL | 0 refills | Status: AC
  Filled 2023-06-02: qty 1, 1d supply, fill #0

## 2023-06-05 ENCOUNTER — Other Ambulatory Visit: Payer: Self-pay

## 2023-06-06 ENCOUNTER — Other Ambulatory Visit: Payer: Self-pay | Admitting: Family Medicine

## 2023-06-06 ENCOUNTER — Other Ambulatory Visit: Payer: Self-pay

## 2023-06-07 ENCOUNTER — Other Ambulatory Visit: Payer: Self-pay

## 2023-06-07 ENCOUNTER — Ambulatory Visit: Payer: Medicaid Other | Attending: Family

## 2023-06-07 DIAGNOSIS — M25512 Pain in left shoulder: Secondary | ICD-10-CM | POA: Insufficient documentation

## 2023-06-07 DIAGNOSIS — R293 Abnormal posture: Secondary | ICD-10-CM | POA: Insufficient documentation

## 2023-06-07 DIAGNOSIS — G8929 Other chronic pain: Secondary | ICD-10-CM | POA: Insufficient documentation

## 2023-06-07 DIAGNOSIS — M6281 Muscle weakness (generalized): Secondary | ICD-10-CM | POA: Insufficient documentation

## 2023-06-07 MED ORDER — GLUCOSE BLOOD VI STRP
ORAL_STRIP | 6 refills | Status: DC
  Filled 2023-06-07: qty 100, 33d supply, fill #0
  Filled 2023-07-10 – 2023-07-11 (×2): qty 100, 33d supply, fill #1
  Filled 2023-08-21: qty 100, 33d supply, fill #2

## 2023-06-07 NOTE — Therapy (Signed)
OUTPATIENT PHYSICAL THERAPY TREATMENT/DISCHARGE    Patient Name: Julie Robertson MRN: [id number] DOB:1976/02/07, 47 y.o., female Today's Date: 06/08/2023  END OF SESSION:  PT End of Session - 06/07/23 1458     Visit Number 5    Number of Visits 9    Date for PT Re-Evaluation 06/28/23    Authorization Type [address] [employer]    PT Start Time 1458   arrived late   PT Stop Time 1525    PT Time Calculation (min) 27 min                Past Medical History:  Diagnosis Date   Anxiety    Asthma    Coronary artery disease    CVA (cerebral vascular accident) (HCC)    Diabetes mellitus without complication (HCC)    Diabetic neuropathy (HCC) 12/2013   vs carpal tunnell.  numbness tingling in right fingers. rx with Gabapentin.   Dyslipidemia 08/2009   Dyspnea    Gall stones 08/2009   GERD (gastroesophageal reflux disease)    H/O tubal ligation    Obesity    BMI 41, 250# 03/2015   Past Surgical History:  Procedure Laterality Date   APPLICATION OF WOUND VAC N/A 05/09/2019   Procedure: APPLICATION OF WOUND VAC;  Surgeon: Henrene Dodge, MD;  Location: [facility] ORS;  Service: General;  Laterality: N/A;  [id number]   BUBBLE STUDY  12/24/2021   Procedure: BUBBLE STUDY;  Surgeon: Lewayne Bunting, MD;  Location: [facility] ENDOSCOPY;  Service: Cardiovascular;;   CHOLECYSTECTOMY N/A 03/10/2015   Procedure: LAPAROSCOPIC CHOLECYSTECTOMY WITH INTRAOPERATIVE CHOLANGIOGRAM;  Surgeon: Violeta Gelinas, MD;  Location: [facility] OR;  Service: General;  Laterality: N/A;   CORONARY BALLOON ANGIOPLASTY N/A 08/09/2022   Procedure: CORONARY BALLOON ANGIOPLASTY;  Surgeon: Yates Decamp, MD;  Location: [facility] INVASIVE CV LAB;  Service: Cardiovascular;  Laterality: N/A;   ERCP N/A 03/11/2015   Procedure: ENDOSCOPIC RETROGRADE CHOLANGIOPANCREATOGRAPHY (ERCP);  Surgeon: Rachael Fee, MD;  Location: [facility] ENDOSCOPY;  Service: Endoscopy;  Laterality: N/A;   ESOPHAGOGASTRODUODENOSCOPY (EGD) WITH PROPOFOL N/A 07/25/2018   Procedure:  ESOPHAGOGASTRODUODENOSCOPY (EGD) WITH PROPOFOL;  Surgeon: Meridee Score Netty Starring., MD;  Location: [facility] ENDOSCOPY;  Service: Gastroenterology;  Laterality: N/A;   INSERTION OF MESH N/A 05/09/2019   Procedure: INSERTION OF MESH;  Surgeon: Henrene Dodge, MD;  Location: [facility] ORS;  Service: General;  Laterality: N/A;   LEFT HEART CATH AND CORONARY ANGIOGRAPHY N/A 08/09/2022   Procedure: LEFT HEART CATH AND CORONARY ANGIOGRAPHY;  Surgeon: Yates Decamp, MD;  Location: [facility] INVASIVE CV LAB;  Service: Cardiovascular;  Laterality: N/A;   TEE WITHOUT CARDIOVERSION N/A 12/24/2021   Procedure: TRANSESOPHAGEAL ECHOCARDIOGRAM (TEE);  Surgeon: Lewayne Bunting, MD;  Location: [facility] ENDOSCOPY;  Service: Cardiovascular;  Laterality: N/A;   Transduodenal Ampullectomy  09/2015   TUBAL LIGATION     VENTRAL HERNIA REPAIR N/A 05/09/2019   Procedure: HERNIA REPAIR VENTRAL ADULT with MESH;  Surgeon: Henrene Dodge, MD;  Location: [facility] ORS;  Service: General;  Laterality: N/A;   Patient Active Problem List   Diagnosis Date Noted   Chronic constipation 05/04/2023   Atherosclerosis of native coronary artery of native heart with stable angina pectoris (HCC) 08/10/2022   Mixed hyperlipidemia 08/10/2022   Hypertriglyceridemia 08/10/2022   History of stroke 08/10/2022   Class 3 severe obesity due to excess calories with serious comorbidity and body mass index (BMI) of 40.0 to 44.9 in adult [facility]) 08/10/2022   PFO (patent foramen ovale) 08/10/2022   Post PTCA 08/09/2022  PFO with atrial septal aneurysm 12/24/2021   Acute ischemic stroke (HCC) 12/21/2021   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   Acute pulmonary edema (HCC) 09/06/2020   Incisional hernia, without obstruction or gangrene 05/09/2019   Uncontrolled type 2 diabetes mellitus with hyperglycemia, with long-term current use of insulin (HCC) 04/12/2017   Hypertension associated with diabetes (HCC) 04/12/2017   Morbid (severe) obesity due to excess calories (HCC)  10/18/2016   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   Iron deficiency anemia 09/02/2015   Anemia 09/01/2015   Asthma 08/28/2015   Type 2 diabetes mellitus (HCC) 07/31/2015   Dyslipidemia 12/04/2013   Numbness and tingling in right hand 12/04/2013   Smoking 12/04/2013   Elevated BP 12/04/2013   Pap smear, high-risk (screening, no prior abnormality) 02/13/2013   Exposure to STD 02/13/2013    PCP: Hoy Register, MD  REFERRING PROVIDER: Adonis Huguenin, NP   REFERRING DIAG: [phone] (ICD-10-CM) - Acute pain of left shoulder due to trauma   THERAPY DIAG:  Chronic left shoulder pain  Muscle weakness (generalized)  Abnormal posture  Rationale for Evaluation and Treatment: Rehabilitation  ONSET DATE: Chronic  SUBJECTIVE:                                                                                                                                                                                      SUBJECTIVE STATEMENT: Pt presents to PT with reports of continued pain but otherwise is doing well. Will have bariatric surgery on 12/11.  Hand dominance: Right  PERTINENT HISTORY: CVA, DMII, HTN  PAIN:  Are you having pain?  Yes: NPRS scale: 5/10 Worst: 10/10 Pain location: L shoulder Pain description: sharp Aggravating factors: OH reaching, lifting, laying on L side Relieving factors: rest, medication  PRECAUTIONS: None  RED FLAGS: None   WEIGHT BEARING RESTRICTIONS: No  FALLS:  Has patient fallen in last 6 months? No  LIVING ENVIRONMENT: Lives with: lives with their family Lives in: House/apartment  OCCUPATION: Not currently working  PLOF: Independent  PATIENT GOALS: decrease her shoulder pain, improve her comfort with dressing and other ADLs  OBJECTIVE:  Note: Objective measures were completed at Evaluation unless otherwise noted.  DIAGNOSTIC FINDINGS:  See imaging for MRI results  PATIENT SURVEYS:  FOTO: deferred due to time FOTO 05/17/23: 42 intake,  60 predicted (read by in person interpreter- spanish version) 06/07/2023: 66% function  COGNITION: Overall cognitive status: Within functional limits for tasks assessed     SENSATION: WFL  POSTURE: Rounded shoulders, fwd head  UPPER EXTREMITY ROM:   Active ROM Right eval Left eval LEFT 05/17/23 Left 06/07/23  Shoulder flexion WFL 105 115 130  Shoulder  extension      Shoulder abduction WFL 80 95   Shoulder adduction      Shoulder internal rotation T8 L4    Shoulder external rotation      Elbow flexion      Elbow extension      Wrist flexion      Wrist extension      Wrist ulnar deviation      Wrist radial deviation      Wrist pronation      Wrist supination      (Blank rows = not tested)  UPPER EXTREMITY MMT:  MMT Right eval Left eval Left 06/07/23  Shoulder flexion     Shoulder extension     Shoulder abduction     Shoulder adduction     Shoulder internal rotation 5 3+ 4  Shoulder external rotation 5 3 with pain 4  Middle trapezius     Lower trapezius     Elbow flexion     Elbow extension     Wrist flexion     Wrist extension     Wrist ulnar deviation     Wrist radial deviation     Wrist pronation     Wrist supination     Grip strength (lbs)     (Blank rows = not tested)  SHOULDER SPECIAL TESTS: Impingement tests: Painful arc test: positive  SLAP lesions: DNT Instability tests: DNT Rotator cuff assessment: DNT Biceps assessment: DNT  JOINT MOBILITY TESTING:  GH hypomobility L  PALPATION:  TTP to L infraspinatus, L upper   TREATMENT: [facility] Adult PT Treatment:                                                DATE:  06/07/23 Therapeutic Exercise: L upper trap stretch x 30"  Row x 10 GTB R shoulder IR/ER x 10 RTB L shoulder wall slide flexion x 10  Supine dow flexion x 10 Seated ER with dow x 10 Therapeutic Activity: Assessment of tests/measures, goals, and outcomes for discharge  [facility] Adult PT Treatment:                                                 DATE:  05/31/23 Therapeutic Exercise: Pulleys shoulder flexion x 60" Row 2x10 GTB Shoulder ext 2x10 GTB L shoulder ER 2x10 YTB Supine horizontal abd 2x15 GTB Supine dow flexion x 10 Supine chest press 2x10 Physioball shoulder flexion on incline x 15 L Seated low row 3x10 25#  [facility] Adult PT Treatment:                                                DATE:  05/24/23 Therapeutic Exercise: UBE lvl 1.0 x 2 min fwd while taking subjective Pulleys shoulder flexion x 60" Row 2x10 GTB Shoulder ext 2x10 GTB L shoulder ER iso x 10 - 5" hold L shoulder ER 2x10 RTB Supine horizontal abd 3x10 RTB Supine dow flexion 2x10  PATIENT EDUCATION: Education details: continue HEP Person educated: Patient Education method: Explanation, Demonstration, and Handouts Education comprehension: verbalized understanding and returned demonstration  HOME EXERCISE PROGRAM: Access Code: [email or web address] URL: [email or web address] Date: 06/07/2023 Prepared by: Edwinna Areola  Exercises - Seated Upper Trapezius Stretch (Mirrored)  - 3-4 x weekly - 2 reps - 30 sec hold - Standing Shoulder Row with Anchored Resistance  - 3-4 x weekly - 3 sets - 10 reps - green band hold - Supine shoulder Flexion Extension AAROM with Dowel to comfortable height overhead  - 3-4 x weekly - 2 sets - 10 reps - Shoulder Flexion Wall Slide with Towel  - 3-4 x weekly - 2 sets - 10 reps - Shoulder External Rotation with Anchored Resistance  - 3-4 x weekly - 2 sets - 10 reps - rojo hold - Shoulder Internal Rotation with Resistance  - 3-4 x weekly - 2 sets - 10 reps - rojo hold - Seated Shoulder External Rotation AAROM with Dowel  - 3-4 x weekly - 2 sets - 10 reps - 5 sec hold - Seated Plantar Fascia Mobilization with Small Ball  - 3-4 x weekly - 3 min hold - Long Sitting Calf Stretch with Strap  - 3-4 x weekly - 2 reps - 30 sec hold  ASSESSMENT:  CLINICAL IMPRESSION: Pt was able to complete prescribed exercises and  demonstrated knowledge of HEP. Over the course of PT treatment she has progressed fair, with improved ROM and function assessed via FOTO. PT will stop due to upcoming bariatric surgery. She will continue to progress with HEP and is being discharged at this time.   EVAL; Patient is a 47 y.o. F who was seen today for physical therapy evaluation and treatment for chronic L shoulder pain. Physical findings are consistent with orthopedics referral and MRI results as pt demonstrates decrease in L shoulder ROM and RTC muscle weakness. She is operating well below PLOF for objective measures and has significant difficulty completing home ADLs. Pt would benefit from skilled PT services working on improving L periscapular and RTC strength in order to decrease pain and improve function.   OBJECTIVE IMPAIRMENTS: decreased activity tolerance, decreased endurance, decreased mobility, decreased ROM, decreased strength, impaired UE functional use, and pain.   ACTIVITY LIMITATIONS: carrying, lifting, bathing, dressing, and reach over head  PARTICIPATION LIMITATIONS: meal prep, cleaning, driving, shopping, community activity, and yard work  PERSONAL FACTORS: Time since onset of injury/illness/exacerbation and 3+ comorbidities: CVA, DMII, HTN  are also affecting patient's functional outcome.   REHAB POTENTIAL: Good  CLINICAL DECISION MAKING: Evolving/moderate complexity  EVALUATION COMPLEXITY: Moderate   GOALS: Goals reviewed with patient? No  SHORT TERM GOALS: Target date: 05/23/2023   Pt will be compliant and knowledgeable with initial HEP for improved comfort and carryover Baseline: initial HEP given   Goal status: MET  2.  Pt will self report left shoulder pain no greater than 7/10 for improved comfort and functional ability Baseline: 10/10 at worst 06/07/2023: 7/10 Goal status: NOT MET   LONG TERM GOALS: Target date: 06/28/2023   Pt will improve FOTO function score to no less than predicted value  as proxy for functional improvement Baseline: 42% function;  06/07/2023: 66% function Goal status: MET   2.  Pt will self report left shoulder pain no greater than 3/10 for improved comfort and functional ability Baseline: 10/10 at worst Goal status: NOT MET   3.  Pt will improve L shoulder flexion to no less than 140 degrees for improved functional ability with OH reaching into cabinets and other home ADLs Baseline: 105 degrees 06/07/2023: 130 degrees Goal status: PARTIALLY MET  4.  Pt will improve L shoulder IR/ER strength through MMT to no less than 4/5 for improved comfort and dynamic stability with OH reaching Baseline: see MMT chart Goal status: MET  PLAN:  PT FREQUENCY: 1x/week  PT DURATION: 8 weeks  PLANNED INTERVENTIONS: 97164- PT Re-evaluation, 97110-Therapeutic exercises, 97530- Therapeutic activity, 97112- Neuromuscular re-education, 97535- Self Care, [address]- Manual therapy, 97014- Electrical stimulation (unattended), [id number]- Electrical stimulation (manual), Dry Needling, Cryotherapy, and Moist heat  PLAN FOR NEXT SESSION:  assess HEP response, progressive periscapular/RTC strenghtening   Eloy End PT  06/08/23 1:10 PM

## 2023-06-14 ENCOUNTER — Other Ambulatory Visit: Payer: Self-pay

## 2023-06-14 ENCOUNTER — Telehealth: Payer: Self-pay | Admitting: Cardiology

## 2023-06-14 NOTE — Telephone Encounter (Signed)
Pt called in stating she has a small hole in her heart. Dr. Odis Hollingshead cleared her for the surgery however the anesthesiology will not continue with surgery until the hole is closed. She states they are concerned about her having another stroke. She asked if Dr. Odis Hollingshead has any suggestions so that she can get her surgery done.

## 2023-06-14 NOTE — Telephone Encounter (Signed)
Called patient with Julie Robertson id # [phone]   Patient states she is ready for surgery today but anesthesiology cancelled because of the hole in her heart. He stated it will need to be closed before bariatric surgery. She would like to know what will be the next steps.

## 2023-06-15 NOTE — Telephone Encounter (Signed)
Please update as per our discussion.   ST

## 2023-06-16 NOTE — Telephone Encounter (Signed)
Spoke with pt over the phone using [facility] Interpreter with ID [phone]. Explained to pt that Dr. Odis Hollingshead wanted to refer her to Dr. Jacinto Halim for PFO closure. Pt stated she was okay with proceeding forward with this plan. Explained to pt the first available looked to be 07/19/23 at 9:40 am. Pt stated she was okay with making that appointment. Appt has been made, pt is aware.

## 2023-06-22 ENCOUNTER — Other Ambulatory Visit: Payer: Self-pay

## 2023-06-27 ENCOUNTER — Other Ambulatory Visit: Payer: Self-pay

## 2023-06-27 ENCOUNTER — Other Ambulatory Visit: Payer: Self-pay | Admitting: Family Medicine

## 2023-06-27 MED ORDER — PANTOPRAZOLE SODIUM 40 MG PO TBEC
40.0000 mg | DELAYED_RELEASE_TABLET | Freq: Every day | ORAL | 1 refills | Status: DC
  Filled 2023-07-03: qty 90, 90d supply, fill #0
  Filled 2023-09-26: qty 90, 90d supply, fill #1

## 2023-07-03 ENCOUNTER — Other Ambulatory Visit: Payer: Self-pay

## 2023-07-10 ENCOUNTER — Other Ambulatory Visit: Payer: Self-pay

## 2023-07-10 ENCOUNTER — Other Ambulatory Visit (INDEPENDENT_AMBULATORY_CARE_PROVIDER_SITE_OTHER): Payer: Self-pay | Admitting: Primary Care

## 2023-07-10 DIAGNOSIS — E1165 Type 2 diabetes mellitus with hyperglycemia: Secondary | ICD-10-CM

## 2023-07-11 ENCOUNTER — Other Ambulatory Visit: Payer: Self-pay | Admitting: Family Medicine

## 2023-07-11 ENCOUNTER — Other Ambulatory Visit: Payer: Self-pay

## 2023-07-11 DIAGNOSIS — E1169 Type 2 diabetes mellitus with other specified complication: Secondary | ICD-10-CM

## 2023-07-11 MED ORDER — METFORMIN HCL 500 MG PO TABS
1000.0000 mg | ORAL_TABLET | Freq: Two times a day (BID) | ORAL | 1 refills | Status: DC
  Filled 2023-07-11: qty 360, 90d supply, fill #0
  Filled 2023-09-26: qty 360, 90d supply, fill #1

## 2023-07-12 ENCOUNTER — Other Ambulatory Visit: Payer: Self-pay

## 2023-07-17 ENCOUNTER — Other Ambulatory Visit: Payer: Self-pay

## 2023-07-17 ENCOUNTER — Other Ambulatory Visit (HOSPITAL_BASED_OUTPATIENT_CLINIC_OR_DEPARTMENT_OTHER): Payer: Self-pay

## 2023-07-18 ENCOUNTER — Other Ambulatory Visit: Payer: Self-pay

## 2023-07-19 ENCOUNTER — Encounter: Payer: Self-pay | Admitting: Dermatology

## 2023-07-19 ENCOUNTER — Ambulatory Visit: Payer: Medicaid Other | Attending: Cardiology | Admitting: Cardiology

## 2023-07-19 ENCOUNTER — Other Ambulatory Visit: Payer: Self-pay

## 2023-07-19 ENCOUNTER — Ambulatory Visit: Payer: Medicaid Other | Admitting: Cardiology

## 2023-07-19 ENCOUNTER — Ambulatory Visit (INDEPENDENT_AMBULATORY_CARE_PROVIDER_SITE_OTHER): Payer: Medicaid Other | Admitting: Dermatology

## 2023-07-19 ENCOUNTER — Encounter: Payer: Self-pay | Admitting: Cardiology

## 2023-07-19 VITALS — BP 100/66 | HR 88 | Resp 16 | Ht 64.0 in | Wt 227.6 lb

## 2023-07-19 DIAGNOSIS — L2989 Other pruritus: Secondary | ICD-10-CM | POA: Diagnosis not present

## 2023-07-19 DIAGNOSIS — Z1283 Encounter for screening for malignant neoplasm of skin: Secondary | ICD-10-CM

## 2023-07-19 DIAGNOSIS — L578 Other skin changes due to chronic exposure to nonionizing radiation: Secondary | ICD-10-CM

## 2023-07-19 DIAGNOSIS — I83813 Varicose veins of bilateral lower extremities with pain: Secondary | ICD-10-CM | POA: Diagnosis not present

## 2023-07-19 DIAGNOSIS — Q2112 Patent foramen ovale: Secondary | ICD-10-CM | POA: Diagnosis not present

## 2023-07-19 DIAGNOSIS — I63432 Cerebral infarction due to embolism of left posterior cerebral artery: Secondary | ICD-10-CM | POA: Diagnosis not present

## 2023-07-19 DIAGNOSIS — L814 Other melanin hyperpigmentation: Secondary | ICD-10-CM

## 2023-07-19 DIAGNOSIS — E66813 Obesity, class 3: Secondary | ICD-10-CM | POA: Diagnosis not present

## 2023-07-19 DIAGNOSIS — D1801 Hemangioma of skin and subcutaneous tissue: Secondary | ICD-10-CM | POA: Diagnosis not present

## 2023-07-19 DIAGNOSIS — L821 Other seborrheic keratosis: Secondary | ICD-10-CM

## 2023-07-19 DIAGNOSIS — I8393 Asymptomatic varicose veins of bilateral lower extremities: Secondary | ICD-10-CM

## 2023-07-19 DIAGNOSIS — W908XXA Exposure to other nonionizing radiation, initial encounter: Secondary | ICD-10-CM

## 2023-07-19 DIAGNOSIS — Z6841 Body Mass Index (BMI) 40.0 and over, adult: Secondary | ICD-10-CM

## 2023-07-19 DIAGNOSIS — D229 Melanocytic nevi, unspecified: Secondary | ICD-10-CM

## 2023-07-19 MED ORDER — TRIAMCINOLONE ACETONIDE 0.1 % EX OINT
1.0000 | TOPICAL_OINTMENT | Freq: Two times a day (BID) | CUTANEOUS | 0 refills | Status: DC
  Filled 2023-07-19: qty 454, 28d supply, fill #0

## 2023-07-19 MED ORDER — CLOPIDOGREL BISULFATE 75 MG PO TABS
75.0000 mg | ORAL_TABLET | Freq: Every day | ORAL | 3 refills | Status: DC
  Filled 2023-07-19: qty 90, 90d supply, fill #0
  Filled 2023-10-12: qty 90, 90d supply, fill #1
  Filled 2024-01-16: qty 90, 90d supply, fill #2

## 2023-07-19 NOTE — Patient Instructions (Addendum)
 Medication Instructions:  Your physician has recommended you make the following change in your medication: Start Clopidogrel  75 mg by mouth daily   *If you need a refill on your cardiac medications before your next appointment, please call your pharmacy*   Lab Work: Have lab work (BMP and CBC) done at [facility] on the first floor today If you have labs (blood work) drawn today and your tests are completely normal, you will receive your results only by: MyChart Message (if you have MyChart) OR A paper copy in the mail If you have any lab test that is abnormal or we need to change your treatment, we will call you to review the results.   Testing/Procedures: PFO closure scheduled for January 23   Follow-Up: At [facility], you and your health needs are our priority.  As part of our continuing mission to provide you with exceptional heart care, we have created designated Provider Care Teams.  These Care Teams include your primary Cardiologist (physician) and Advanced Practice Providers (APPs -  Physician Assistants and Nurse Practitioners) who all work together to provide you with the care you need, when you need it.  We recommend signing up for the patient portal called "MyChart".  Sign up information is provided on this After Visit Summary.  MyChart is used to connect with patients for Virtual Visits (Telemedicine).  Patients are able to view lab/test results, encounter notes, upcoming appointments, etc.  Non-urgent messages can be sent to your provider as well.   To learn more about what you can do with MyChart, go to [email or web address].    Your next appointment:   1 month(s)  Provider:   Dr Berry Bristol    Other Instructions CLOSURE INSTRUCTIONS: You are scheduled for a PFO/ASD CLOSURE on Thursday, January 23 with Dr. Berry Bristol  1. Please arrive at the [facility] (Main Entrance A) at [facility]: [address] [place], [place] [address] at 5:30 AM (This time is two hours  before your procedure to ensure your preparation). Free valet parking service is available. You are allowed TWO visitors in the waiting room during your procedure. Both you and your guests must wear masks upon arrival. Special note: Every effort is made to have your procedure done on time. Please understand that emergencies sometimes delay scheduled procedures.  2. Diet: Make sure to stay well hydrated the day before your procedure! Do not eat solid foods after midnight.  You may have clear liquids until 5am upon the day of the procedure.  3. Labs:done on January 15.  4. Medication instructions in preparation for your procedure:  MAKE SURE TO TAKE YOUR ASPIRIN  AND PLAVIX  the morning of your closure     Other meds may be taken as directed with a sip of water.              Stop Jardiance  and Ozempic  one week prior to procedure  Do not take any diabetes medicine the morning of the procedure                    5. Plan for one night stay--bring personal belongings (this is a disclaimer, not an intention. We want you to go home!). 6. Bring a current list of your medications and current insurance cards. 7. You MUST have a responsible person to drive you home. 8. Someone MUST be with you the first 24 hours after you arrive home or your discharge will be delayed. 9. Please wear clothes that are easy  to get on and off and wear slip-on shoes.   FOLLOW-UP APPOINTMENT: 1 month with Dr Berry Bristol

## 2023-07-19 NOTE — Progress Notes (Signed)
   Total Body Skin Exam (TBSE) Visit   Subjective  Julie Robertson is a 48 y.o. female who presents for the following: Skin Cancer Screening and Full Body Skin Exam  Patient presents today for follow up visit for TBSE. Patient was last evaluated on 05/18/23 . Patient reports she does have spots, moles and lesions of concern to be evaluated. Patient reports throughout her lifetime she has had moderate sun exposure. Currently, patient reports if she has excessive sun exposure, she does not apply sunscreen and/or wears protective coverings. Patient reports she has hx of bx. Patient unsure of family history of skin cancers as she was adopted.  The patient has spots, moles and lesions to be evaluated, some may be new or changing and the patient has concerns that these could be cancer.  The following portions of the chart were reviewed this encounter and updated as appropriate: medications, allergies, medical history  Review of Systems:  No other skin or systemic complaints except as noted in HPI or Assessment and Plan.  Objective  Well appearing patient in no apparent distress; mood and affect are within normal limits.  A full examination was performed including scalp, head, eyes, ears, nose, lips, neck, chest, axillae, abdomen, back, buttocks, bilateral upper extremities, bilateral lower extremities, hands, feet, fingers, toes, fingernails, and toenails. All findings within normal limits unless otherwise noted below.   Relevant physical exam findings are noted in the Assessment and Plan.    Assessment & Plan   LENTIGINES, SEBORRHEIC KERATOSES, HEMANGIOMAS - Benign normal skin lesions - Benign-appearing - Call for any changes  MELANOCYTIC NEVI - Tan-brown and/or pink-flesh-colored symmetric macules and papules - Benign appearing on exam today - Observation - Call clinic for new or changing moles - Recommend daily use of broad spectrum spf 30+ sunscreen to sun-exposed  areas.   ACTINIC DAMAGE - Chronic condition, secondary to cumulative UV/sun exposure - diffuse scaly erythematous macules with underlying dyspigmentation - Recommend daily broad spectrum sunscreen SPF 30+ to sun-exposed areas, reapply every 2 hours as needed.  - Staying in the shade or wearing long sleeves, sun glasses (UVA+UVB protection) and wide brim hats (4-inch brim around the entire circumference of the hat) are also recommended for sun protection.  - Call for new or changing lesions.   Post-anesthesia pruritus Assessment: Patient reports pruritus following anesthesia administration, with physical examination revealing a visible rash and bumps consistent with a post-anesthesia reaction.   Plan: Prescribe Triamcinolone  (Kenalog ) 0.1% cream to be applied twice daily to affected areas for 2 weeks. Discontinue after 2 weeks to allow skin recovery. May restart application if symptoms recur.  Symptomatic varicose veins Assessment: Patient reports bilateral leg pain with physical examination revealing visible varicose veins with noticeable protrusion, suggesting potential for insurance coverage of treatment.  Plan: Referral to vascular surgery for evaluation and management.  Diagnosis: Symptomatic varicose veins.      SKIN CANCER SCREENING PERFORMED TODAY.     No follow-ups on file.    Documentation: I have reviewed the above documentation for accuracy and completeness, and I agree with the above.   I, Shirron Louanne Roussel, CMA, am acting as scribe for [employer], DO.   Louana Roup, DO

## 2023-07-19 NOTE — Patient Instructions (Addendum)
 Hello Ms. Ramos,  Thank you for visiting us  today. We appreciate your commitment to maintaining your health and are pleased to assist you in your healthcare journey. Here is a summary of the key instructions from today's consultation:  Full Body Examination: Your previous treatment sites have healed well, as observed during our thorough examination today.  Medication: You have been prescribed Triamcinolone  cream for the rash and bumps post-anesthesia.   Application: Please apply this cream twice a day.   Duration: Continue this application for two weeks, then give your skin a break.  Referral: A referral will be sent to a vascular surgeon for the assessment and potential treatment of your symptomatic varicose veins.  Follow-Up: Schedule a follow-up appointment for your next skin check in one year. My assistant will arrange this and also send the prescription for Triamcinolone  cream to your pharmacy.  We wish you the best with your upcoming surgery and look forward to seeing you at your next appointment.  Warm regards,  Dr. Louana Roup Dermatology Important Information  Due to recent changes in healthcare laws, you may see results of your pathology and/or laboratory studies on MyChart before the doctors have had a chance to review them. We understand that in some cases there may be results that are confusing or concerning to you. Please understand that not all results are received at the same time and often the doctors may need to interpret multiple results in order to provide you with the best plan of care or course of treatment. Therefore, we ask that you please give us  2 business days to thoroughly review all your results before contacting the office for clarification. Should we see a critical lab result, you will be contacted sooner.   If You Need Anything After Your Visit  If you have any questions or concerns for your doctor, please call our main line at [phone] If no one  answers, please leave a voicemail as directed and we will return your call as soon as possible. Messages left after 4 pm will be answered the following business day.   You may also send us  a message via MyChart. We typically respond to MyChart messages within 1-2 business days.  For prescription refills, please ask your pharmacy to contact our office. Our fax number is [phone].  If you have an urgent issue when the clinic is closed that cannot wait until the next business day, you can page your doctor at the number below.    Please note that while we do our best to be available for urgent issues outside of office hours, we are not available 24/7.   If you have an urgent issue and are unable to reach us , you may choose to seek medical care at your doctor's office, retail clinic, urgent care center, or emergency room.  If you have a medical emergency, please immediately call 911 or go to the emergency department. In the event of inclement weather, please call our main line at [phone] for an update on the status of any delays or closures.  Dermatology Medication Tips: Please keep the boxes that topical medications come in in order to help keep track of the instructions about where and how to use these. Pharmacies typically print the medication instructions only on the boxes and not directly on the medication tubes.   If your medication is too expensive, please contact our office at [phone] or send us  a message through MyChart.   We are unable to tell what your  co-pay for medications will be in advance as this is different depending on your insurance coverage. However, we may be able to find a substitute medication at lower cost or fill out paperwork to get insurance to cover a needed medication.   If a prior authorization is required to get your medication covered by your insurance company, please allow us  1-2 business days to complete this process.  Drug prices often vary  depending on where the prescription is filled and some pharmacies may offer cheaper prices.  The website www.goodrx.com contains coupons for medications through different pharmacies. The prices here do not account for what the cost may be with help from insurance (it may be cheaper with your insurance), but the website can give you the price if you did not use any insurance.  - You can print the associated coupon and take it with your prescription to the pharmacy.  - You may also stop by our office during regular business hours and pick up a GoodRx coupon card.  - If you need your prescription sent electronically to a different pharmacy, notify our office through [facility] or by phone at [phone]

## 2023-07-19 NOTE — H&P (View-Only) (Signed)
 Cardiology Office Note:  .   Date:  07/19/2023  ID:  [name], DOB 12-Aug-1975, MRN [id number] PCP: Hoy Register, MD  [facility] Providers Cardiologist:  Tessa Lerner, DO   History of Present Illness: .   Julie Robertson is a Hispanic 48 y.o. 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  Interpreter was present during the visit. Discussed the use of AI scribe software for clinical note transcription with the patient, who gave verbal consent to proceed.  History of Present Illness   The patient, with a history of heart disease, diabetes, and obesity, presents for a follow-up visit. She reports significant weight loss through diet, exercise, and the use of Ozempic. She reports no chest pain or shortness of breath. She has been attending a gym five times a week for two hours, mainly doing cardio exercises. She expresses a desire for bariatric surgery, surgery was canceled in view of plans for PFO closure in view of recurrent embolic stroke.  She now presents to discuss ASD repair.  States that since weight loss, it she has not had any significant dyspnea, chest pain or palpitations.  She has not had any recurrence of strokelike symptoms.  Also since coronary angioplasty she has not had any chest discomfort or use nitroglycerin since last office visit.  Labs   Lab Results  Component Value Date   CHOL 120 03/29/2023   HDL 33 (L) 03/29/2023   LDLCALC 64 03/29/2023   LDLDIRECT 64 07/21/2022   TRIG 127 03/29/2023   CHOLHDL 6.0 12/21/2021   Lab Results  Component Value Date   NA 139 03/29/2023   K 4.4  03/29/2023   CO2 22 03/29/2023   GLUCOSE 139 (H) 03/29/2023   BUN 18 03/29/2023   CREATININE 0.94 03/29/2023   CALCIUM 9.6 03/29/2023   EGFR 75 03/29/2023   GFRNONAA >60 08/10/2022      Latest Ref Rng & Units 03/29/2023   11:34 AM 08/22/2022    2:10 PM 08/10/2022    5:49 AM  BMP  Glucose 70 - 99 mg/dL 409  811  914   BUN 6 - 24 mg/dL 18  15  11    Creatinine 0.57 - 1.00 mg/dL 7.82  9.56  2.13   BUN/Creat Ratio 9 - 23 19  15     Sodium 134 - 144 mmol/L 139  135  130   Potassium 3.5 - 5.2 mmol/L 4.4  4.2  3.7   Chloride 96 - 106 mmol/L 104  100  99   CO2 20 - 29 mmol/L 22  21  23    Calcium 8.7 - 10.2 mg/dL 9.6  9.2  8.7       Latest Ref Rng & Units 03/29/2023   11:34 AM 08/10/2022    5:49 AM 07/21/2022   10:10 AM  CBC  WBC 3.4 - 10.8 x10E3/uL 8.0  5.4  6.2   Hemoglobin 11.1 - 15.9 g/dL 08.6  9.6  57.8   Hematocrit 34.0 - 46.6 % 40.1  32.0  37.3   Platelets 150 - 450 x10E3/uL 382  350  404    Lab Results  Component Value Date   HGBA1C 6.4  03/21/2023    Lab Results  Component Value Date   TSH 3.090 03/22/2018    Review of Systems  Cardiovascular:  Negative for chest pain, dyspnea on exertion and leg swelling.    Physical Exam:   VS:  BP 100/66 (BP Location: Left Arm, Patient Position: Sitting, Cuff Size: Large)   Pulse 88   Resp 16   Ht 5\' 4"  (1.626 m)   Wt 227 lb 9.6 oz (103.2 kg)   SpO2 96%   BMI 39.07 kg/m    Wt Readings from Last 3 Encounters:  07/19/23 227 lb 9.6 oz (103.2 kg)  05/09/23 232 lb (105.2 kg)  03/29/23 238 lb (108 kg)    Physical Exam Constitutional:      Appearance: She is obese.  Neck:     Vascular: No carotid bruit or JVD.  Cardiovascular:     Rate and Rhythm: Normal rate and regular rhythm.     Pulses: Intact distal pulses.     Heart sounds: Normal heart sounds. No murmur heard.    No gallop.  Pulmonary:     Effort: Pulmonary effort is normal.     Breath sounds: Normal breath sounds.  Abdominal:     General: Bowel sounds are normal.      Palpations: Abdomen is soft.  Musculoskeletal:     Right lower leg: No edema.     Left lower leg: No edema.    Studies Reviewed: Marland Kitchen    MRI brain with and without contrast 12/21/2021: Brain MRI: 1. Patchy acute cortical infarcts in the left occipital lobe with extension along the posterior left watershed. 2. Remote perforator infarct the right corona radiata.  Transcranial Doppler with bubbles: 12/21/2021 Positive TCD Bubble study with valasalva only indicative of a small right to left shunt  Transesophageal echocardiogram 12/24/2021: Atrial septal aneurysm; no shunting noted with color doppler but positive saline microcavitation study suggests PFO. 2.Left ventricular ejection fraction, by estimation, is 55 to 60%. The left ventricle has normal function. The left ventricle has no regional wall motion abnormalities. 3. Right ventricular systolic function is normal. The right ventricular size is normal. 4. No left atrial/left atrial appendage thrombus was detected. 5. The mitral valve is normal in structure. Trivial mitral valve regurgitation. The aortic valve is tricuspid. Aortic valve regurgitation is trivial. Aortic valve sclerosis is present, with no evidence of aortic valve stenosis. 6. Agitated saline contrast bubble study was positive with shunting observed within 3-6 cardiac cycles suggestive of interatrial shunt.  Heart Catheterization: Left Heart Catheterization 08/09/22:  LV 130/4, EDP 12 mmHg.  Ao 07/04/2010/72, mean 90 mmHg.  No pressure gradient across the aortic valve. LM: Large-caliber vessel.  Smooth and normal. LAD: Large-caliber vessel.  It is smooth and normal.  Gives origin to a very large diagonal 1 with a secondary branch.  LAD ends before reaching the apex. LCx: Large-caliber vessel.  Gives origin to a very tiny OM1, continues as large OM 2 and 3.  There is a focal 90% concentric stenosis. RCA: Large-caliber vessel.  Slow flow is noted in the right coronary  artery.  Mid segment has a minimal luminal irregularity.   Intervention data: Successful PTCA and scoring balloon angioplasty with a 3.5 x 10 mm Wolverine cutting balloon.  Stenosis reduced from 90% to 0% with TIMI-3 to TIMI-3 flow, much better flow postangioplasty.      EKG:    EKG Interpretation Date/Time:  Wednesday July 19 2023 09:57:43 EST Ventricular Rate:  80 PR Interval:  176  QRS Duration:  82 QT Interval:  380 QTC Calculation: 438 R Axis:   -8  Text Interpretation: EKG 07/29/2023: Normal sinus rhythm at the rate of 80 bpm, normal EKG.  No significant change from 05/09/2023. Confirmed by Delrae Rend [phone]) on 07/19/2023 7:45:29 PM    Medications and allergies    No Known Allergies   Current Outpatient Medications:    aspirin 81 MG chewable tablet, Chew 1 tablet (81 mg total) by mouth daily., Disp: 90 tablet, Rfl: 1   atorvastatin (LIPITOR) 40 MG tablet, Take 1 tablet (40 mg total) by mouth at bedtime., Disp: 90 tablet, Rfl: 3   blood glucose meter kit and supplies KIT, Use up to four times daily as directed., Disp: 1 each, Rfl: 11   Blood Glucose Monitoring Suppl (TRUE METRIX METER) w/Device KIT, Use as directed, Disp: 1 kit, Rfl: 0   CALCIUM PO, Take 200 mg by mouth daily., Disp: , Rfl:    Continuous Glucose Sensor (FREESTYLE LIBRE 3 SENSOR) MISC, USE AS DIRECTED, Disp: 2 each, Rfl: 3   Docusate Sodium (DSS) 100 MG CAPS, Take 100 mg by mouth daily., Disp: , Rfl:    empagliflozin (JARDIANCE) 25 MG TABS tablet, Take 1 tablet (25 mg total) by mouth daily before breakfast., Disp: 90 tablet, Rfl: 1   fenofibrate (TRICOR) 145 MG tablet, Take 1 tablet (145 mg total) by mouth daily., Disp: 90 tablet, Rfl: 2   gabapentin (NEURONTIN) 300 MG capsule, Take 1 capsule (300 mg total) by mouth 3 (three) times daily., Disp: 270 capsule, Rfl: 2   glipiZIDE (GLUCOTROL) 10 MG tablet, Take 1 tablet (10 mg total) by mouth 2 (two) times daily., Disp: 180 tablet, Rfl: 1   glucose blood  test strip, Use to check blood sugar three times daily., Disp: 100 each, Rfl: 6   Insulin Glargine (BASAGLAR KWIKPEN) 100 UNIT/ML, Inject 38 Units into the skin daily., Disp: 18 mL, Rfl: 6   Lancets MISC, use as directed, Disp: 100 each, Rfl: 0   latanoprost (XALATAN) 0.005 % ophthalmic solution, Place 1 drop into both eyes every evening., Disp: 5 mL, Rfl: 11   linaclotide (LINZESS) 290 MCG CAPS capsule, Take 1 capsule (290 mcg total) by mouth daily before breakfast., Disp: 90 capsule, Rfl: 1   loratadine (CLARITIN) 10 MG tablet, Take 1 tablet (10 mg total) by mouth daily., Disp: 30 tablet, Rfl: 11   losartan (COZAAR) 25 MG tablet, Take 25 mg by mouth daily., Disp: , Rfl:    Magnesium Citrate 200 MG TABS, Take 200 mg by mouth daily., Disp: , Rfl:    MAGNESIUM CITRATE PO, Take 200 mg by mouth daily., Disp: , Rfl:    Melatonin 10 MG CHEW, Chew 10 mg by mouth daily as needed., Disp: , Rfl:    metFORMIN (GLUCOPHAGE) 500 MG tablet, Take 2 tablets (1,000 mg total) by mouth 2 (two) times daily with a meal., Disp: 360 tablet, Rfl: 1   metoCLOPramide (REGLAN) 10 MG tablet, Take 1 tablet at 5am the morning of surgery and then every 6 to 8 hours as needed for nausea., Disp: 30 tablet, Rfl: 3   metoprolol succinate (TOPROL XL) 25 MG 24 hr tablet, Take 1 tablet (25 mg total) by mouth daily., Disp: 90 tablet, Rfl: 1   Multiple Vitamin tablet, Take 1 tablet by mouth daily., Disp: , Rfl:    Multiple Vitamins-Minerals (HAIR SKIN & NAILS) TABS, Take 1 tablet by mouth daily., Disp: , Rfl:    nabumetone (RELAFEN) 500  MG tablet, Take 1 tablet (500 mg total) by mouth daily., Disp: 90 tablet, Rfl: 0   nitroGLYCERIN (NITROSTAT) 0.4 MG SL tablet, Place 1 tablet (0.4 mg total) under the tongue every 5 (five) minutes as needed for chest pain. If you require more than two tablets five minutes apart go to the nearest ER via EMS., Disp: 30 tablet, Rfl: 0   Omega-3 Fatty Acids (FISH OIL) 1200 MG CAPS, Take 1 capsule by mouth  daily., Disp: , Rfl:    ondansetron (ZOFRAN-ODT) 8 MG disintegrating tablet, Take one tablet (8 mg dose) by mouth every 8 (eight) hours as needed for Nausea for up to 20 days., Disp: 30 tablet, Rfl: 1   pantoprazole (PROTONIX) 40 MG tablet, Take 1 tablet (40 mg total) by mouth daily before breakfast. (Stop taking omeprazole)., Disp: 90 tablet, Rfl: 1   prednisoLONE acetate (PRED FORTE) 1 % ophthalmic suspension, Place 1 drop into both eyes 3 (three) times daily for 3 days, THEN 1 drop 2 (two) times daily for 3 days, THEN 1 drop daily for 3 days, then STOP., Disp: 7 mL, Rfl: 1   Semaglutide, 2 MG/DOSE, 8 MG/3ML SOPN, Inject 2 mg as directed once a week., Disp: 3 mL, Rfl: 6   tiZANidine (ZANAFLEX) 4 MG tablet, Take 1 tablet (4 mg total) by mouth every 8 (eight) hours as needed for muscle spasms., Disp: 60 tablet, Rfl: 1   clopidogrel (PLAVIX) 75 MG tablet, Take 1 tablet (75 mg total) by mouth daily., Disp: 90 tablet, Rfl: 3   triamcinolone ointment (KENALOG) 0.1 %, Apply 1 Application topically 2 (two) times daily. Use for 2 weeks and then stop.  Repeat as needed for flares., Disp: 454 g, Rfl: 0   ASSESSMENT AND PLAN: .      ICD-10-CM   1. PFO (patent foramen ovale)  Q21.12 EKG 12-Lead    CBC    Basic Metabolic Panel (BMET)    2. Cerebrovascular accident (CVA) due to embolism of left posterior cerebral artery (HCC)  Z61.096 CBC    Basic Metabolic Panel (BMET)    clopidogrel (PLAVIX) 75 MG tablet    3. Class 3 severe obesity due to excess calories with serious comorbidity and body mass index (BMI) of 40.0 to 44.9 in adult [facility])  E45.409 CBC   E66.01 Basic Metabolic Panel (BMET)   Z68.41      Assessment and Plan    Patent Foramen Ovale (PFO) History of stroke likely secondary to PFO. Discussed the risks/benefits of PFO closure procedure.  These include but not limited to less than 1% risk of death, stroke, need for thoracotomy due to embolization, bleeding and infection. -Schedule PFO  closure procedure for 07/27/2023 or 07/28/2023. -Continue aspirin, and restart Plavix 75 mg daily for now. -Administer clopidogrel before the procedure. -Follow-up in 1 month post-procedure.  Obesity Significant weight loss achieved through diet, exercise, and Ozempic. Discussed further strategies for weight loss including slow eating, taking breaks during meals, and water exercises. -Continue current weight loss strategies. -Consider adding water exercises to routine. -Stop Ozempic and Jardiance 1 week before PFO closure procedure.  Diabetes Well-controlled with Ozempic and Jardiance. A1c at 6.4. -Continue current diabetes management. -Stop Ozempic and Jardiance 1 week before PFO closure procedure.         Informed Consent   Shared Decision Making/Informed Consent The risks [stroke (1 in 1000), death (1 in 1000), kidney failure [usually temporary] (1 in 500), bleeding (1 in 200), allergic reaction [possibly serious] (1 in  200)], benefits (diagnostic support and management of coronary artery disease) and alternatives of a cardiac catheterization/PFO repair were discussed in detail with Ms. Soberanis-Ramos and she is willing to proceed.      Signed,  Yates Decamp, MD, [facility] 07/19/2023, 7:47 PM [facility] [address] [place], [place] [address] Phone: [phone]. Fax:  [phone]

## 2023-07-19 NOTE — H&P (View-Only) (Signed)
Cardiology Office Note:  .   Date:  07/19/2023  ID:  [name], DOB 12-Aug-1975, MRN [id number] PCP: Hoy Register, MD  [facility] Providers Cardiologist:  Tessa Lerner, DO   History of Present Illness: .   Julie Robertson is a Hispanic 48 y.o. 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  Interpreter was present during the visit. Discussed the use of AI scribe software for clinical note transcription with the patient, who gave verbal consent to proceed.  History of Present Illness   The patient, with a history of heart disease, diabetes, and obesity, presents for a follow-up visit. She reports significant weight loss through diet, exercise, and the use of Ozempic. She reports no chest pain or shortness of breath. She has been attending a gym five times a week for two hours, mainly doing cardio exercises. She expresses a desire for bariatric surgery, surgery was canceled in view of plans for PFO closure in view of recurrent embolic stroke.  She now presents to discuss ASD repair.  States that since weight loss, it she has not had any significant dyspnea, chest pain or palpitations.  She has not had any recurrence of strokelike symptoms.  Also since coronary angioplasty she has not had any chest discomfort or use nitroglycerin since last office visit.  Labs   Lab Results  Component Value Date   CHOL 120 03/29/2023   HDL 33 (L) 03/29/2023   LDLCALC 64 03/29/2023   LDLDIRECT 64 07/21/2022   TRIG 127 03/29/2023   CHOLHDL 6.0 12/21/2021   Lab Results  Component Value Date   NA 139 03/29/2023   K 4.4  03/29/2023   CO2 22 03/29/2023   GLUCOSE 139 (H) 03/29/2023   BUN 18 03/29/2023   CREATININE 0.94 03/29/2023   CALCIUM 9.6 03/29/2023   EGFR 75 03/29/2023   GFRNONAA >60 08/10/2022      Latest Ref Rng & Units 03/29/2023   11:34 AM 08/22/2022    2:10 PM 08/10/2022    5:49 AM  BMP  Glucose 70 - 99 mg/dL 409  811  914   BUN 6 - 24 mg/dL 18  15  11    Creatinine 0.57 - 1.00 mg/dL 7.82  9.56  2.13   BUN/Creat Ratio 9 - 23 19  15     Sodium 134 - 144 mmol/L 139  135  130   Potassium 3.5 - 5.2 mmol/L 4.4  4.2  3.7   Chloride 96 - 106 mmol/L 104  100  99   CO2 20 - 29 mmol/L 22  21  23    Calcium 8.7 - 10.2 mg/dL 9.6  9.2  8.7       Latest Ref Rng & Units 03/29/2023   11:34 AM 08/10/2022    5:49 AM 07/21/2022   10:10 AM  CBC  WBC 3.4 - 10.8 x10E3/uL 8.0  5.4  6.2   Hemoglobin 11.1 - 15.9 g/dL 08.6  9.6  57.8   Hematocrit 34.0 - 46.6 % 40.1  32.0  37.3   Platelets 150 - 450 x10E3/uL 382  350  404    Lab Results  Component Value Date   HGBA1C 6.4  03/21/2023    Lab Results  Component Value Date   TSH 3.090 03/22/2018    Review of Systems  Cardiovascular:  Negative for chest pain, dyspnea on exertion and leg swelling.    Physical Exam:   VS:  BP 100/66 (BP Location: Left Arm, Patient Position: Sitting, Cuff Size: Large)   Pulse 88   Resp 16   Ht 5\' 4"  (1.626 m)   Wt 227 lb 9.6 oz (103.2 kg)   SpO2 96%   BMI 39.07 kg/m    Wt Readings from Last 3 Encounters:  07/19/23 227 lb 9.6 oz (103.2 kg)  05/09/23 232 lb (105.2 kg)  03/29/23 238 lb (108 kg)    Physical Exam Constitutional:      Appearance: She is obese.  Neck:     Vascular: No carotid bruit or JVD.  Cardiovascular:     Rate and Rhythm: Normal rate and regular rhythm.     Pulses: Intact distal pulses.     Heart sounds: Normal heart sounds. No murmur heard.    No gallop.  Pulmonary:     Effort: Pulmonary effort is normal.     Breath sounds: Normal breath sounds.  Abdominal:     General: Bowel sounds are normal.      Palpations: Abdomen is soft.  Musculoskeletal:     Right lower leg: No edema.     Left lower leg: No edema.    Studies Reviewed: Marland Kitchen    MRI brain with and without contrast 12/21/2021: Brain MRI: 1. Patchy acute cortical infarcts in the left occipital lobe with extension along the posterior left watershed. 2. Remote perforator infarct the right corona radiata.  Transcranial Doppler with bubbles: 12/21/2021 Positive TCD Bubble study with valasalva only indicative of a small right to left shunt  Transesophageal echocardiogram 12/24/2021: Atrial septal aneurysm; no shunting noted with color doppler but positive saline microcavitation study suggests PFO. 2.Left ventricular ejection fraction, by estimation, is 55 to 60%. The left ventricle has normal function. The left ventricle has no regional wall motion abnormalities. 3. Right ventricular systolic function is normal. The right ventricular size is normal. 4. No left atrial/left atrial appendage thrombus was detected. 5. The mitral valve is normal in structure. Trivial mitral valve regurgitation. The aortic valve is tricuspid. Aortic valve regurgitation is trivial. Aortic valve sclerosis is present, with no evidence of aortic valve stenosis. 6. Agitated saline contrast bubble study was positive with shunting observed within 3-6 cardiac cycles suggestive of interatrial shunt.  Heart Catheterization: Left Heart Catheterization 08/09/22:  LV 130/4, EDP 12 mmHg.  Ao 07/04/2010/72, mean 90 mmHg.  No pressure gradient across the aortic valve. LM: Large-caliber vessel.  Smooth and normal. LAD: Large-caliber vessel.  It is smooth and normal.  Gives origin to a very large diagonal 1 with a secondary branch.  LAD ends before reaching the apex. LCx: Large-caliber vessel.  Gives origin to a very tiny OM1, continues as large OM 2 and 3.  There is a focal 90% concentric stenosis. RCA: Large-caliber vessel.  Slow flow is noted in the right coronary  artery.  Mid segment has a minimal luminal irregularity.   Intervention data: Successful PTCA and scoring balloon angioplasty with a 3.5 x 10 mm Wolverine cutting balloon.  Stenosis reduced from 90% to 0% with TIMI-3 to TIMI-3 flow, much better flow postangioplasty.      EKG:    EKG Interpretation Date/Time:  Wednesday July 19 2023 09:57:43 EST Ventricular Rate:  80 PR Interval:  176  QRS Duration:  82 QT Interval:  380 QTC Calculation: 438 R Axis:   -8  Text Interpretation: EKG 07/29/2023: Normal sinus rhythm at the rate of 80 bpm, normal EKG.  No significant change from 05/09/2023. Confirmed by Delrae Rend [phone]) on 07/19/2023 7:45:29 PM    Medications and allergies    No Known Allergies   Current Outpatient Medications:    aspirin 81 MG chewable tablet, Chew 1 tablet (81 mg total) by mouth daily., Disp: 90 tablet, Rfl: 1   atorvastatin (LIPITOR) 40 MG tablet, Take 1 tablet (40 mg total) by mouth at bedtime., Disp: 90 tablet, Rfl: 3   blood glucose meter kit and supplies KIT, Use up to four times daily as directed., Disp: 1 each, Rfl: 11   Blood Glucose Monitoring Suppl (TRUE METRIX METER) w/Device KIT, Use as directed, Disp: 1 kit, Rfl: 0   CALCIUM PO, Take 200 mg by mouth daily., Disp: , Rfl:    Continuous Glucose Sensor (FREESTYLE LIBRE 3 SENSOR) MISC, USE AS DIRECTED, Disp: 2 each, Rfl: 3   Docusate Sodium (DSS) 100 MG CAPS, Take 100 mg by mouth daily., Disp: , Rfl:    empagliflozin (JARDIANCE) 25 MG TABS tablet, Take 1 tablet (25 mg total) by mouth daily before breakfast., Disp: 90 tablet, Rfl: 1   fenofibrate (TRICOR) 145 MG tablet, Take 1 tablet (145 mg total) by mouth daily., Disp: 90 tablet, Rfl: 2   gabapentin (NEURONTIN) 300 MG capsule, Take 1 capsule (300 mg total) by mouth 3 (three) times daily., Disp: 270 capsule, Rfl: 2   glipiZIDE (GLUCOTROL) 10 MG tablet, Take 1 tablet (10 mg total) by mouth 2 (two) times daily., Disp: 180 tablet, Rfl: 1   glucose blood  test strip, Use to check blood sugar three times daily., Disp: 100 each, Rfl: 6   Insulin Glargine (BASAGLAR KWIKPEN) 100 UNIT/ML, Inject 38 Units into the skin daily., Disp: 18 mL, Rfl: 6   Lancets MISC, use as directed, Disp: 100 each, Rfl: 0   latanoprost (XALATAN) 0.005 % ophthalmic solution, Place 1 drop into both eyes every evening., Disp: 5 mL, Rfl: 11   linaclotide (LINZESS) 290 MCG CAPS capsule, Take 1 capsule (290 mcg total) by mouth daily before breakfast., Disp: 90 capsule, Rfl: 1   loratadine (CLARITIN) 10 MG tablet, Take 1 tablet (10 mg total) by mouth daily., Disp: 30 tablet, Rfl: 11   losartan (COZAAR) 25 MG tablet, Take 25 mg by mouth daily., Disp: , Rfl:    Magnesium Citrate 200 MG TABS, Take 200 mg by mouth daily., Disp: , Rfl:    MAGNESIUM CITRATE PO, Take 200 mg by mouth daily., Disp: , Rfl:    Melatonin 10 MG CHEW, Chew 10 mg by mouth daily as needed., Disp: , Rfl:    metFORMIN (GLUCOPHAGE) 500 MG tablet, Take 2 tablets (1,000 mg total) by mouth 2 (two) times daily with a meal., Disp: 360 tablet, Rfl: 1   metoCLOPramide (REGLAN) 10 MG tablet, Take 1 tablet at 5am the morning of surgery and then every 6 to 8 hours as needed for nausea., Disp: 30 tablet, Rfl: 3   metoprolol succinate (TOPROL XL) 25 MG 24 hr tablet, Take 1 tablet (25 mg total) by mouth daily., Disp: 90 tablet, Rfl: 1   Multiple Vitamin tablet, Take 1 tablet by mouth daily., Disp: , Rfl:    Multiple Vitamins-Minerals (HAIR SKIN & NAILS) TABS, Take 1 tablet by mouth daily., Disp: , Rfl:    nabumetone (RELAFEN) 500  MG tablet, Take 1 tablet (500 mg total) by mouth daily., Disp: 90 tablet, Rfl: 0   nitroGLYCERIN (NITROSTAT) 0.4 MG SL tablet, Place 1 tablet (0.4 mg total) under the tongue every 5 (five) minutes as needed for chest pain. If you require more than two tablets five minutes apart go to the nearest ER via EMS., Disp: 30 tablet, Rfl: 0   Omega-3 Fatty Acids (FISH OIL) 1200 MG CAPS, Take 1 capsule by mouth  daily., Disp: , Rfl:    ondansetron (ZOFRAN-ODT) 8 MG disintegrating tablet, Take one tablet (8 mg dose) by mouth every 8 (eight) hours as needed for Nausea for up to 20 days., Disp: 30 tablet, Rfl: 1   pantoprazole (PROTONIX) 40 MG tablet, Take 1 tablet (40 mg total) by mouth daily before breakfast. (Stop taking omeprazole)., Disp: 90 tablet, Rfl: 1   prednisoLONE acetate (PRED FORTE) 1 % ophthalmic suspension, Place 1 drop into both eyes 3 (three) times daily for 3 days, THEN 1 drop 2 (two) times daily for 3 days, THEN 1 drop daily for 3 days, then STOP., Disp: 7 mL, Rfl: 1   Semaglutide, 2 MG/DOSE, 8 MG/3ML SOPN, Inject 2 mg as directed once a week., Disp: 3 mL, Rfl: 6   tiZANidine (ZANAFLEX) 4 MG tablet, Take 1 tablet (4 mg total) by mouth every 8 (eight) hours as needed for muscle spasms., Disp: 60 tablet, Rfl: 1   clopidogrel (PLAVIX) 75 MG tablet, Take 1 tablet (75 mg total) by mouth daily., Disp: 90 tablet, Rfl: 3   triamcinolone ointment (KENALOG) 0.1 %, Apply 1 Application topically 2 (two) times daily. Use for 2 weeks and then stop.  Repeat as needed for flares., Disp: 454 g, Rfl: 0   ASSESSMENT AND PLAN: .      ICD-10-CM   1. PFO (patent foramen ovale)  Q21.12 EKG 12-Lead    CBC    Basic Metabolic Panel (BMET)    2. Cerebrovascular accident (CVA) due to embolism of left posterior cerebral artery (HCC)  Z61.096 CBC    Basic Metabolic Panel (BMET)    clopidogrel (PLAVIX) 75 MG tablet    3. Class 3 severe obesity due to excess calories with serious comorbidity and body mass index (BMI) of 40.0 to 44.9 in adult [facility])  E45.409 CBC   E66.01 Basic Metabolic Panel (BMET)   Z68.41      Assessment and Plan    Patent Foramen Ovale (PFO) History of stroke likely secondary to PFO. Discussed the risks/benefits of PFO closure procedure.  These include but not limited to less than 1% risk of death, stroke, need for thoracotomy due to embolization, bleeding and infection. -Schedule PFO  closure procedure for 07/27/2023 or 07/28/2023. -Continue aspirin, and restart Plavix 75 mg daily for now. -Administer clopidogrel before the procedure. -Follow-up in 1 month post-procedure.  Obesity Significant weight loss achieved through diet, exercise, and Ozempic. Discussed further strategies for weight loss including slow eating, taking breaks during meals, and water exercises. -Continue current weight loss strategies. -Consider adding water exercises to routine. -Stop Ozempic and Jardiance 1 week before PFO closure procedure.  Diabetes Well-controlled with Ozempic and Jardiance. A1c at 6.4. -Continue current diabetes management. -Stop Ozempic and Jardiance 1 week before PFO closure procedure.         Informed Consent   Shared Decision Making/Informed Consent The risks [stroke (1 in 1000), death (1 in 1000), kidney failure [usually temporary] (1 in 500), bleeding (1 in 200), allergic reaction [possibly serious] (1 in  200)], benefits (diagnostic support and management of coronary artery disease) and alternatives of a cardiac catheterization/PFO repair were discussed in detail with Ms. Soberanis-Ramos and she is willing to proceed.      Signed,  Yates Decamp, MD, [facility] 07/19/2023, 7:47 PM [facility] [address] [place], [place] [address] Phone: [phone]. Fax:  [phone]

## 2023-07-19 NOTE — Progress Notes (Signed)
 Cardiology Office Note:  .   Date:  07/19/2023  ID:  [name], DOB 12-Aug-1975, MRN [id number] PCP: Hoy Register, MD  [facility] Providers Cardiologist:  Tessa Lerner, DO   History of Present Illness: .   Julie Robertson is a Hispanic 48 y.o. 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  Interpreter was present during the visit. Discussed the use of AI scribe software for clinical note transcription with the patient, who gave verbal consent to proceed.  History of Present Illness   The patient, with a history of heart disease, diabetes, and obesity, presents for a follow-up visit. She reports significant weight loss through diet, exercise, and the use of Ozempic. She reports no chest pain or shortness of breath. She has been attending a gym five times a week for two hours, mainly doing cardio exercises. She expresses a desire for bariatric surgery, surgery was canceled in view of plans for PFO closure in view of recurrent embolic stroke.  She now presents to discuss ASD repair.  States that since weight loss, it she has not had any significant dyspnea, chest pain or palpitations.  She has not had any recurrence of strokelike symptoms.  Also since coronary angioplasty she has not had any chest discomfort or use nitroglycerin since last office visit.  Labs   Lab Results  Component Value Date   CHOL 120 03/29/2023   HDL 33 (L) 03/29/2023   LDLCALC 64 03/29/2023   LDLDIRECT 64 07/21/2022   TRIG 127 03/29/2023   CHOLHDL 6.0 12/21/2021   Lab Results  Component Value Date   NA 139 03/29/2023   K 4.4  03/29/2023   CO2 22 03/29/2023   GLUCOSE 139 (H) 03/29/2023   BUN 18 03/29/2023   CREATININE 0.94 03/29/2023   CALCIUM 9.6 03/29/2023   EGFR 75 03/29/2023   GFRNONAA >60 08/10/2022      Latest Ref Rng & Units 03/29/2023   11:34 AM 08/22/2022    2:10 PM 08/10/2022    5:49 AM  BMP  Glucose 70 - 99 mg/dL 409  811  914   BUN 6 - 24 mg/dL 18  15  11    Creatinine 0.57 - 1.00 mg/dL 7.82  9.56  2.13   BUN/Creat Ratio 9 - 23 19  15     Sodium 134 - 144 mmol/L 139  135  130   Potassium 3.5 - 5.2 mmol/L 4.4  4.2  3.7   Chloride 96 - 106 mmol/L 104  100  99   CO2 20 - 29 mmol/L 22  21  23    Calcium 8.7 - 10.2 mg/dL 9.6  9.2  8.7       Latest Ref Rng & Units 03/29/2023   11:34 AM 08/10/2022    5:49 AM 07/21/2022   10:10 AM  CBC  WBC 3.4 - 10.8 x10E3/uL 8.0  5.4  6.2   Hemoglobin 11.1 - 15.9 g/dL 08.6  9.6  57.8   Hematocrit 34.0 - 46.6 % 40.1  32.0  37.3   Platelets 150 - 450 x10E3/uL 382  350  404    Lab Results  Component Value Date   HGBA1C 6.4  03/21/2023    Lab Results  Component Value Date   TSH 3.090 03/22/2018    Review of Systems  Cardiovascular:  Negative for chest pain, dyspnea on exertion and leg swelling.    Physical Exam:   VS:  BP 100/66 (BP Location: Left Arm, Patient Position: Sitting, Cuff Size: Large)   Pulse 88   Resp 16   Ht 5\' 4"  (1.626 m)   Wt 227 lb 9.6 oz (103.2 kg)   SpO2 96%   BMI 39.07 kg/m    Wt Readings from Last 3 Encounters:  07/19/23 227 lb 9.6 oz (103.2 kg)  05/09/23 232 lb (105.2 kg)  03/29/23 238 lb (108 kg)    Physical Exam Constitutional:      Appearance: She is obese.  Neck:     Vascular: No carotid bruit or JVD.  Cardiovascular:     Rate and Rhythm: Normal rate and regular rhythm.     Pulses: Intact distal pulses.     Heart sounds: Normal heart sounds. No murmur heard.    No gallop.  Pulmonary:     Effort: Pulmonary effort is normal.     Breath sounds: Normal breath sounds.  Abdominal:     General: Bowel sounds are normal.      Palpations: Abdomen is soft.  Musculoskeletal:     Right lower leg: No edema.     Left lower leg: No edema.    Studies Reviewed: Marland Kitchen    MRI brain with and without contrast 12/21/2021: Brain MRI: 1. Patchy acute cortical infarcts in the left occipital lobe with extension along the posterior left watershed. 2. Remote perforator infarct the right corona radiata.  Transcranial Doppler with bubbles: 12/21/2021 Positive TCD Bubble study with valasalva only indicative of a small right to left shunt  Transesophageal echocardiogram 12/24/2021: Atrial septal aneurysm; no shunting noted with color doppler but positive saline microcavitation study suggests PFO. 2.Left ventricular ejection fraction, by estimation, is 55 to 60%. The left ventricle has normal function. The left ventricle has no regional wall motion abnormalities. 3. Right ventricular systolic function is normal. The right ventricular size is normal. 4. No left atrial/left atrial appendage thrombus was detected. 5. The mitral valve is normal in structure. Trivial mitral valve regurgitation. The aortic valve is tricuspid. Aortic valve regurgitation is trivial. Aortic valve sclerosis is present, with no evidence of aortic valve stenosis. 6. Agitated saline contrast bubble study was positive with shunting observed within 3-6 cardiac cycles suggestive of interatrial shunt.  Heart Catheterization: Left Heart Catheterization 08/09/22:  LV 130/4, EDP 12 mmHg.  Ao 07/04/2010/72, mean 90 mmHg.  No pressure gradient across the aortic valve. LM: Large-caliber vessel.  Smooth and normal. LAD: Large-caliber vessel.  It is smooth and normal.  Gives origin to a very large diagonal 1 with a secondary branch.  LAD ends before reaching the apex. LCx: Large-caliber vessel.  Gives origin to a very tiny OM1, continues as large OM 2 and 3.  There is a focal 90% concentric stenosis. RCA: Large-caliber vessel.  Slow flow is noted in the right coronary  artery.  Mid segment has a minimal luminal irregularity.   Intervention data: Successful PTCA and scoring balloon angioplasty with a 3.5 x 10 mm Wolverine cutting balloon.  Stenosis reduced from 90% to 0% with TIMI-3 to TIMI-3 flow, much better flow postangioplasty.      EKG:    EKG Interpretation Date/Time:  Wednesday July 19 2023 09:57:43 EST Ventricular Rate:  80 PR Interval:  176  QRS Duration:  82 QT Interval:  380 QTC Calculation: 438 R Axis:   -8  Text Interpretation: EKG 07/29/2023: Normal sinus rhythm at the rate of 80 bpm, normal EKG.  No significant change from 05/09/2023. Confirmed by Delrae Rend [phone]) on 07/19/2023 7:45:29 PM    Medications and allergies    No Known Allergies   Current Outpatient Medications:    aspirin 81 MG chewable tablet, Chew 1 tablet (81 mg total) by mouth daily., Disp: 90 tablet, Rfl: 1   atorvastatin (LIPITOR) 40 MG tablet, Take 1 tablet (40 mg total) by mouth at bedtime., Disp: 90 tablet, Rfl: 3   blood glucose meter kit and supplies KIT, Use up to four times daily as directed., Disp: 1 each, Rfl: 11   Blood Glucose Monitoring Suppl (TRUE METRIX METER) w/Device KIT, Use as directed, Disp: 1 kit, Rfl: 0   CALCIUM PO, Take 200 mg by mouth daily., Disp: , Rfl:    Continuous Glucose Sensor (FREESTYLE LIBRE 3 SENSOR) MISC, USE AS DIRECTED, Disp: 2 each, Rfl: 3   Docusate Sodium (DSS) 100 MG CAPS, Take 100 mg by mouth daily., Disp: , Rfl:    empagliflozin (JARDIANCE) 25 MG TABS tablet, Take 1 tablet (25 mg total) by mouth daily before breakfast., Disp: 90 tablet, Rfl: 1   fenofibrate (TRICOR) 145 MG tablet, Take 1 tablet (145 mg total) by mouth daily., Disp: 90 tablet, Rfl: 2   gabapentin (NEURONTIN) 300 MG capsule, Take 1 capsule (300 mg total) by mouth 3 (three) times daily., Disp: 270 capsule, Rfl: 2   glipiZIDE (GLUCOTROL) 10 MG tablet, Take 1 tablet (10 mg total) by mouth 2 (two) times daily., Disp: 180 tablet, Rfl: 1   glucose blood  test strip, Use to check blood sugar three times daily., Disp: 100 each, Rfl: 6   Insulin Glargine (BASAGLAR KWIKPEN) 100 UNIT/ML, Inject 38 Units into the skin daily., Disp: 18 mL, Rfl: 6   Lancets MISC, use as directed, Disp: 100 each, Rfl: 0   latanoprost (XALATAN) 0.005 % ophthalmic solution, Place 1 drop into both eyes every evening., Disp: 5 mL, Rfl: 11   linaclotide (LINZESS) 290 MCG CAPS capsule, Take 1 capsule (290 mcg total) by mouth daily before breakfast., Disp: 90 capsule, Rfl: 1   loratadine (CLARITIN) 10 MG tablet, Take 1 tablet (10 mg total) by mouth daily., Disp: 30 tablet, Rfl: 11   losartan (COZAAR) 25 MG tablet, Take 25 mg by mouth daily., Disp: , Rfl:    Magnesium Citrate 200 MG TABS, Take 200 mg by mouth daily., Disp: , Rfl:    MAGNESIUM CITRATE PO, Take 200 mg by mouth daily., Disp: , Rfl:    Melatonin 10 MG CHEW, Chew 10 mg by mouth daily as needed., Disp: , Rfl:    metFORMIN (GLUCOPHAGE) 500 MG tablet, Take 2 tablets (1,000 mg total) by mouth 2 (two) times daily with a meal., Disp: 360 tablet, Rfl: 1   metoCLOPramide (REGLAN) 10 MG tablet, Take 1 tablet at 5am the morning of surgery and then every 6 to 8 hours as needed for nausea., Disp: 30 tablet, Rfl: 3   metoprolol succinate (TOPROL XL) 25 MG 24 hr tablet, Take 1 tablet (25 mg total) by mouth daily., Disp: 90 tablet, Rfl: 1   Multiple Vitamin tablet, Take 1 tablet by mouth daily., Disp: , Rfl:    Multiple Vitamins-Minerals (HAIR SKIN & NAILS) TABS, Take 1 tablet by mouth daily., Disp: , Rfl:    nabumetone (RELAFEN) 500  MG tablet, Take 1 tablet (500 mg total) by mouth daily., Disp: 90 tablet, Rfl: 0   nitroGLYCERIN (NITROSTAT) 0.4 MG SL tablet, Place 1 tablet (0.4 mg total) under the tongue every 5 (five) minutes as needed for chest pain. If you require more than two tablets five minutes apart go to the nearest ER via EMS., Disp: 30 tablet, Rfl: 0   Omega-3 Fatty Acids (FISH OIL) 1200 MG CAPS, Take 1 capsule by mouth  daily., Disp: , Rfl:    ondansetron (ZOFRAN-ODT) 8 MG disintegrating tablet, Take one tablet (8 mg dose) by mouth every 8 (eight) hours as needed for Nausea for up to 20 days., Disp: 30 tablet, Rfl: 1   pantoprazole (PROTONIX) 40 MG tablet, Take 1 tablet (40 mg total) by mouth daily before breakfast. (Stop taking omeprazole)., Disp: 90 tablet, Rfl: 1   prednisoLONE acetate (PRED FORTE) 1 % ophthalmic suspension, Place 1 drop into both eyes 3 (three) times daily for 3 days, THEN 1 drop 2 (two) times daily for 3 days, THEN 1 drop daily for 3 days, then STOP., Disp: 7 mL, Rfl: 1   Semaglutide, 2 MG/DOSE, 8 MG/3ML SOPN, Inject 2 mg as directed once a week., Disp: 3 mL, Rfl: 6   tiZANidine (ZANAFLEX) 4 MG tablet, Take 1 tablet (4 mg total) by mouth every 8 (eight) hours as needed for muscle spasms., Disp: 60 tablet, Rfl: 1   clopidogrel (PLAVIX) 75 MG tablet, Take 1 tablet (75 mg total) by mouth daily., Disp: 90 tablet, Rfl: 3   triamcinolone ointment (KENALOG) 0.1 %, Apply 1 Application topically 2 (two) times daily. Use for 2 weeks and then stop.  Repeat as needed for flares., Disp: 454 g, Rfl: 0   ASSESSMENT AND PLAN: .      ICD-10-CM   1. PFO (patent foramen ovale)  Q21.12 EKG 12-Lead    CBC    Basic Metabolic Panel (BMET)    2. Cerebrovascular accident (CVA) due to embolism of left posterior cerebral artery (HCC)  Z61.096 CBC    Basic Metabolic Panel (BMET)    clopidogrel (PLAVIX) 75 MG tablet    3. Class 3 severe obesity due to excess calories with serious comorbidity and body mass index (BMI) of 40.0 to 44.9 in adult [facility])  E45.409 CBC   E66.01 Basic Metabolic Panel (BMET)   Z68.41      Assessment and Plan    Patent Foramen Ovale (PFO) History of stroke likely secondary to PFO. Discussed the risks/benefits of PFO closure procedure.  These include but not limited to less than 1% risk of death, stroke, need for thoracotomy due to embolization, bleeding and infection. -Schedule PFO  closure procedure for 07/27/2023 or 07/28/2023. -Continue aspirin, and restart Plavix 75 mg daily for now. -Administer clopidogrel before the procedure. -Follow-up in 1 month post-procedure.  Obesity Significant weight loss achieved through diet, exercise, and Ozempic. Discussed further strategies for weight loss including slow eating, taking breaks during meals, and water exercises. -Continue current weight loss strategies. -Consider adding water exercises to routine. -Stop Ozempic and Jardiance 1 week before PFO closure procedure.  Diabetes Well-controlled with Ozempic and Jardiance. A1c at 6.4. -Continue current diabetes management. -Stop Ozempic and Jardiance 1 week before PFO closure procedure.         Informed Consent   Shared Decision Making/Informed Consent The risks [stroke (1 in 1000), death (1 in 1000), kidney failure [usually temporary] (1 in 500), bleeding (1 in 200), allergic reaction [possibly serious] (1 in  200)], benefits (diagnostic support and management of coronary artery disease) and alternatives of a cardiac catheterization/PFO repair were discussed in detail with Ms. Soberanis-Ramos and she is willing to proceed.      Signed,  Yates Decamp, MD, [facility] 07/19/2023, 7:47 PM [facility] [address] [place], [place] [address] Phone: [phone]. Fax:  [phone]

## 2023-07-20 ENCOUNTER — Other Ambulatory Visit: Payer: Self-pay

## 2023-07-20 LAB — BASIC METABOLIC PANEL
BUN/Creatinine Ratio: 18 (ref 9–23)
BUN: 16 mg/dL (ref 6–24)
CO2: 17 mmol/L — ABNORMAL LOW (ref 20–29)
Calcium: 9.5 mg/dL (ref 8.7–10.2)
Chloride: 101 mmol/L (ref 96–106)
Creatinine, Ser: 0.89 mg/dL (ref 0.57–1.00)
Glucose: 258 mg/dL — ABNORMAL HIGH (ref 70–99)
Potassium: 4.3 mmol/L (ref 3.5–5.2)
Sodium: 137 mmol/L (ref 134–144)
eGFR: 80 mL/min/{1.73_m2} (ref >59)

## 2023-07-20 LAB — CBC
Hematocrit: 41.3 % (ref 34.0–46.6)
Hemoglobin: 12 g/dL (ref 11.1–15.9)
MCH: 23.3 pg — ABNORMAL LOW (ref 26.6–33.0)
MCHC: 29.1 g/dL — ABNORMAL LOW (ref 31.5–35.7)
MCV: 80 fL (ref 79–97)
Platelets: 325 10*3/uL (ref 150–450)
RBC: 5.15 x10E6/uL (ref 3.77–5.28)
RDW: 14.3 % (ref 11.7–15.4)
WBC: 5.4 10*3/uL (ref 3.4–10.8)

## 2023-07-20 NOTE — Progress Notes (Signed)
Labs stable for PFO closure

## 2023-07-24 ENCOUNTER — Other Ambulatory Visit: Payer: Self-pay

## 2023-07-25 NOTE — Telephone Encounter (Signed)
 This encounter was created in error - please disregard.

## 2023-07-26 ENCOUNTER — Other Ambulatory Visit: Payer: Self-pay

## 2023-07-26 ENCOUNTER — Telehealth: Payer: Self-pay | Admitting: *Deleted

## 2023-07-26 DIAGNOSIS — I872 Venous insufficiency (chronic) (peripheral): Secondary | ICD-10-CM

## 2023-07-26 NOTE — Telephone Encounter (Signed)
Continue ASA if any confusion about plavix, I can load her in the hospital

## 2023-07-26 NOTE — Telephone Encounter (Addendum)
Patent Foramen Ovale Closure scheduled at [facility] for: Thursday July 27, 2023 7:30 AM Arrival time [facility] at: 5:30 AM  Nothing to eat after midnight prior to procedure, clear liquids until 5 AM day of procedure.  Medication instructions: -Hold:  Insulin/Jardiance/Glipizide/Metformin-AM of procedure  Insulin-1/2 usual dose HS prior to procedure  Patient reports she has not been taking Jardiance and she has not taken semaglutide in 1 week. -Usual morning medications can be taken with sips of water including aspirin 81 mg and Plavix 75 mg  Plan to go home the same day, you will only stay overnight if medically necessary.  You must have responsible adult to drive you home.  Someone must be with you the first 24 hours after you arrive home.  Assistance of Spanish Interpreter Yago ID [id number]-reviewed procedure instructions with patient.

## 2023-07-26 NOTE — Telephone Encounter (Signed)
With assistance of Spanish Interpreter Ky Barban ID [phone]: -Patient reports that she took the last aspirin 81 mg she had on Sunday. -Patient states that she has not been able to get more aspirin 81 mg-I offered to leave samples for her at our [facility]. -Patient states she has regular aspirin and she will start taking today, knows she needs to take before leaving to go the hospital in the morning. -Patient reports she has been taking Plavix daily since the day after her office visit with Dr Jacinto Halim 07/19/23. -Patient knows she needs to take both Plavix and aspirin before leaving to go the hospital in the morning.

## 2023-07-27 ENCOUNTER — Ambulatory Visit (HOSPITAL_COMMUNITY)
Admission: RE | Admit: 2023-07-27 | Discharge: 2023-07-27 | Disposition: A | Discharge status: Home / Self Care | Payer: Medicaid Other | Source: Ambulatory Visit | Attending: Cardiology | Admitting: Cardiology

## 2023-07-27 ENCOUNTER — Encounter (HOSPITAL_COMMUNITY)
Admission: RE | Disposition: A | Discharge status: Home / Self Care | Payer: Self-pay | Source: Ambulatory Visit | Attending: Cardiology

## 2023-07-27 ENCOUNTER — Other Ambulatory Visit: Payer: Self-pay

## 2023-07-27 DIAGNOSIS — Z794 Long term (current) use of insulin: Secondary | ICD-10-CM | POA: Diagnosis not present

## 2023-07-27 DIAGNOSIS — Z7902 Long term (current) use of antithrombotics/antiplatelets: Secondary | ICD-10-CM | POA: Diagnosis not present

## 2023-07-27 DIAGNOSIS — Q2112 Patent foramen ovale: Secondary | ICD-10-CM | POA: Insufficient documentation

## 2023-07-27 DIAGNOSIS — K219 Gastro-esophageal reflux disease without esophagitis: Secondary | ICD-10-CM | POA: Diagnosis not present

## 2023-07-27 DIAGNOSIS — Z7982 Long term (current) use of aspirin: Secondary | ICD-10-CM | POA: Diagnosis not present

## 2023-07-27 DIAGNOSIS — Z7984 Long term (current) use of oral hypoglycemic drugs: Secondary | ICD-10-CM | POA: Diagnosis not present

## 2023-07-27 DIAGNOSIS — G4730 Sleep apnea, unspecified: Secondary | ICD-10-CM | POA: Diagnosis not present

## 2023-07-27 DIAGNOSIS — Z8673 Personal history of transient ischemic attack (TIA), and cerebral infarction without residual deficits: Secondary | ICD-10-CM | POA: Diagnosis not present

## 2023-07-27 DIAGNOSIS — E119 Type 2 diabetes mellitus without complications: Secondary | ICD-10-CM | POA: Diagnosis not present

## 2023-07-27 DIAGNOSIS — E66813 Obesity, class 3: Secondary | ICD-10-CM | POA: Diagnosis not present

## 2023-07-27 DIAGNOSIS — E785 Hyperlipidemia, unspecified: Secondary | ICD-10-CM | POA: Diagnosis not present

## 2023-07-27 DIAGNOSIS — Z955 Presence of coronary angioplasty implant and graft: Secondary | ICD-10-CM | POA: Diagnosis not present

## 2023-07-27 DIAGNOSIS — Z6841 Body Mass Index (BMI) 40.0 and over, adult: Secondary | ICD-10-CM | POA: Insufficient documentation

## 2023-07-27 DIAGNOSIS — I2510 Atherosclerotic heart disease of native coronary artery without angina pectoris: Secondary | ICD-10-CM | POA: Diagnosis not present

## 2023-07-27 DIAGNOSIS — Z7985 Long-term (current) use of injectable non-insulin antidiabetic drugs: Secondary | ICD-10-CM | POA: Diagnosis not present

## 2023-07-27 DIAGNOSIS — Z87891 Personal history of nicotine dependence: Secondary | ICD-10-CM | POA: Diagnosis not present

## 2023-07-27 HISTORY — PX: PATENT FORAMEN OVALE(PFO) CLOSURE: CATH118300

## 2023-07-27 LAB — GLUCOSE, CAPILLARY
Glucose-Capillary: 155 mg/dL — ABNORMAL HIGH (ref 70–99)
Glucose-Capillary: 141 mg/dL — ABNORMAL HIGH (ref 70–99)

## 2023-07-27 SURGERY — PATENT FORAMEN OVALE (PFO) CLOSURE
Anesthesia: LOCAL

## 2023-07-27 MED ORDER — SODIUM CHLORIDE 0.9 % IV SOLN: 250.0000 mL | INTRAVENOUS | Status: DC | PRN

## 2023-07-27 MED ORDER — LIDOCAINE HCL (PF) 1 % IJ SOLN
INTRAMUSCULAR | Status: AC
  Filled 2023-07-27: qty 30

## 2023-07-27 MED ORDER — FENTANYL CITRATE (PF) 100 MCG/2ML IJ SOLN
INTRAMUSCULAR | Status: DC | PRN
  Administered 2023-07-27 (×2): 25 ug via INTRAVENOUS
  Administered 2023-07-27: 50 ug via INTRAVENOUS

## 2023-07-27 MED ORDER — ONDANSETRON HCL 4 MG/2ML IJ SOLN: 4.0000 mg | Freq: Four times a day (QID) | INTRAMUSCULAR | Status: DC | PRN

## 2023-07-27 MED ORDER — SODIUM CHLORIDE 0.9 % WEIGHT BASED INFUSION
3.0000 mL/kg/h | INTRAVENOUS | Status: AC
  Administered 2023-07-27: 3 mL/kg/h via INTRAVENOUS

## 2023-07-27 MED ORDER — ACETAMINOPHEN 325 MG PO TABS
650.0000 mg | ORAL_TABLET | ORAL | Status: DC | PRN
  Administered 2023-07-27: 650 mg via ORAL
  Filled 2023-07-27: qty 2

## 2023-07-27 MED ORDER — MIDAZOLAM HCL 2 MG/2ML IJ SOLN
INTRAMUSCULAR | Status: DC | PRN
  Administered 2023-07-27 (×2): 1 mg via INTRAVENOUS

## 2023-07-27 MED ORDER — IOHEXOL 350 MG/ML SOLN
INTRAVENOUS | Status: DC | PRN
  Administered 2023-07-27: 10 mL

## 2023-07-27 MED ORDER — SODIUM CHLORIDE 0.9 % WEIGHT BASED INFUSION: 1.0000 mL/kg/h | INTRAVENOUS | Status: DC

## 2023-07-27 MED ORDER — LIDOCAINE HCL (PF) 1 % IJ SOLN
INTRAMUSCULAR | Status: DC | PRN
  Administered 2023-07-27: 15 mL

## 2023-07-27 MED ORDER — OXYCODONE-ACETAMINOPHEN 5-325 MG PO TABS: 1.0000 | ORAL_TABLET | Freq: Once | ORAL | Status: AC

## 2023-07-27 MED ORDER — HEPARIN (PORCINE) IN NACL 1000-0.9 UT/500ML-% IV SOLN
INTRAVENOUS | Status: DC | PRN
  Administered 2023-07-27: 500 mL

## 2023-07-27 MED ORDER — FENTANYL CITRATE (PF) 100 MCG/2ML IJ SOLN
INTRAMUSCULAR | Status: AC
  Filled 2023-07-27: qty 2

## 2023-07-27 MED ORDER — SODIUM CHLORIDE 0.9% FLUSH: 3.0000 mL | INTRAVENOUS | Status: DC | PRN

## 2023-07-27 MED ORDER — CLOPIDOGREL BISULFATE 75 MG PO TABS: 75.0000 mg | ORAL_TABLET | ORAL | Status: DC

## 2023-07-27 MED ORDER — ASPIRIN 81 MG PO CHEW: 81.0000 mg | CHEWABLE_TABLET | ORAL | Status: DC

## 2023-07-27 MED ORDER — OXYCODONE-ACETAMINOPHEN 5-325 MG PO TABS
ORAL_TABLET | ORAL | Status: AC
  Administered 2023-07-27: 1 via ORAL
  Filled 2023-07-27: qty 1

## 2023-07-27 MED ORDER — MIDAZOLAM HCL 2 MG/2ML IJ SOLN
INTRAMUSCULAR | Status: AC
  Filled 2023-07-27: qty 2

## 2023-07-27 MED ORDER — CEFAZOLIN SODIUM-DEXTROSE 2-4 GM/100ML-% IV SOLN
2.0000 g | INTRAVENOUS | Status: AC
  Administered 2023-07-27: 2 g via INTRAVENOUS
  Filled 2023-07-27: qty 100

## 2023-07-27 SURGICAL SUPPLY — 15 items
CATH ACUNAV 8FR 90CM (CATHETERS) IMPLANT
CLOSURE PERCLOSE PROSTYLE (VASCULAR PRODUCTS) IMPLANT
COVER SWIFTLINK CONNECTOR (BAG) IMPLANT
INTRODUCER PERFORM 12 30 .038 (SHEATH) IMPLANT
PACK CARDIAC CATHETERIZATION (CUSTOM PROCEDURE TRAY) ×1 IMPLANT
SET ATX-X65L (MISCELLANEOUS) IMPLANT
SHEATH FAST CATH 12F 12CM (SHEATH) IMPLANT
SHEATH PINNACLE 5F 10CM (SHEATH) IMPLANT
SHEATH PINNACLE 6F 10CM (SHEATH) IMPLANT
SHEATH PINNACLE 9F 10CM (SHEATH) IMPLANT
SHEATH PROBE COVER 6X72 (BAG) IMPLANT
TRANSDUCER W/STOPCOCK (MISCELLANEOUS) ×1 IMPLANT
TUBING CIL FLEX 10 FLL-RA (TUBING) ×1 IMPLANT
WIRE EMERALD 3MM-J .035X150CM (WIRE) IMPLANT
WIRE MICRO SET SILHO 5FR 7 (SHEATH) IMPLANT

## 2023-07-27 NOTE — Discharge Instructions (Addendum)
Femoral Site Care This sheet gives you information about how to care for yourself after your procedure. Your health care provider may also give you more specific instructions. If you have problems or questions, contact your health care provider. What can I expect after the procedure?  After the procedure, it is common to have: Bruising that usually fades within 1-2 weeks. Tenderness at the site. Follow these instructions at home: Wound care Follow instructions from your health care provider about how to take care of your insertion site. Make sure you: Wash your hands with soap and water before you change your bandage (dressing). If soap and water are not available, use hand sanitizer. Remove your dressing tomorrow at 12 noon Do not take baths, swim, or use a hot tub until your health care provider approves. You may shower after removal of dressing Pat the area dry with a clean towel. Do not rub the site. This may cause bleeding. Do not apply powder or lotion to the site. Keep the site clean and dry. Check your femoral site every day for signs of infection. Check for: Redness, swelling, or pain. Fluid or blood. Warmth. Pus or a bad smell. Activity For the first 2-3 days after your procedure, or as long as directed: Avoid climbing stairs as much as possible. Do not squat. Do not lift anything that is heavier than 10 lb (4.5 kg), or the limit that you are told, until your health care provider says that it is safe. For 5 days Rest as directed. Avoid sitting for a long time without moving. Get up to take short walks every 1-2 hours. Do not drive for 24 hours if you were given a medicine to help you relax (sedative). General instructions Take over-the-counter and prescription medicines only as told by your health care provider. Keep all follow-up visits as told by your health care provider. This is important. Contact a health care provider if you have: A fever or chills. You have  redness, swelling, or pain around your insertion site. Get help right away if: The catheter insertion area swells very fast. You pass out. You suddenly start to sweat or your skin gets clammy. The catheter insertion area is bleeding, and the bleeding does not stop when you hold steady pressure on the area. The area near or just beyond the catheter insertion site becomes pale, cool, tingly, or numb. These symptoms may represent a serious problem that is an emergency. Do not wait to see if the symptoms will go away. Get medical help right away. Call your local emergency services (911 in the [place] Do not drive yourself to the hospital. Summary After the procedure, it is common to have bruising that usually fades within 1-2 weeks. Check your femoral site every day for signs of infection. Do not lift anything that is heavier than 10 lb (4.5 kg), or the limit that you are told, until your health care provider says that it is safe. This information is not intended to replace advice given to you by your health care provider. Make sure you discuss any questions you have with your health care provider. Document Revised: 07/03/2017 Document Reviewed: 07/03/2017 Elsevier Patient Education  2020 [employer].

## 2023-07-27 NOTE — Progress Notes (Signed)
Patient seen at the bedside, patient's husband present, discussed all the issues that he had a Cath Lab, patient is going stable without any hematoma, there is no bruit, patient to the hospital stay that we have to wait for the groin to heal and probably proceed with the procedure in the next 1 to 2 weeks.  She has good peripheral pulses.  Yates Decamp, MD, [facility] 07/27/2023, 2:54 PM [facility] [address] [place], [place] [address] Phone: [phone]. Fax:  [phone]

## 2023-07-27 NOTE — Interval H&P Note (Signed)
History and Physical Interval Note:  07/27/2023 7:48 AM  Julie Robertson  has presented today for surgery, with the diagnosis of pfo.  The various methods of treatment have been discussed with the patient and family. After consideration of risks, benefits and other options for treatment, the patient has consented to  Procedure(s): PATENT FORAMEN OVALE(PFO) CLOSURE (N/A) as a surgical intervention.  The patient's history has been reviewed, patient examined, no change in status, stable for surgery.  I have reviewed the patient's chart and labs.  Questions were answered to the patient's satisfaction.     Yates Decamp

## 2023-07-28 ENCOUNTER — Encounter (HOSPITAL_COMMUNITY): Payer: Self-pay | Admitting: Cardiology

## 2023-08-01 ENCOUNTER — Other Ambulatory Visit: Payer: Self-pay

## 2023-08-07 ENCOUNTER — Encounter: Payer: Self-pay | Admitting: *Deleted

## 2023-08-07 ENCOUNTER — Telehealth: Payer: Self-pay | Admitting: Cardiology

## 2023-08-07 ENCOUNTER — Other Ambulatory Visit: Payer: Self-pay | Admitting: Family Medicine

## 2023-08-07 ENCOUNTER — Other Ambulatory Visit: Payer: Self-pay

## 2023-08-07 ENCOUNTER — Ambulatory Visit (HOSPITAL_COMMUNITY)
Admission: RE | Admit: 2023-08-07 | Discharge: 2023-08-07 | Disposition: A | Discharge status: Home / Self Care | Payer: Medicaid Other | Source: Ambulatory Visit | Attending: Surgery | Admitting: Surgery

## 2023-08-07 ENCOUNTER — Other Ambulatory Visit: Payer: Self-pay | Admitting: Cardiology

## 2023-08-07 DIAGNOSIS — I872 Venous insufficiency (chronic) (peripheral): Secondary | ICD-10-CM | POA: Diagnosis present

## 2023-08-07 DIAGNOSIS — I2510 Atherosclerotic heart disease of native coronary artery without angina pectoris: Secondary | ICD-10-CM

## 2023-08-07 NOTE — Telephone Encounter (Signed)
Patient is calling stated there was complications with her having her procedure on 07/27/23. Please refer to progress notes on 07/27/23. Patient has an appt this afternoon at 2:45 PM. Patient is requesting for [place] to not call between the time of 2:45 PM- 3:45 PM. Patient will need an interpreter. Please advise.

## 2023-08-07 NOTE — Telephone Encounter (Signed)
Reviewed with Dr Jacinto Halim and patient needs to be rescheduled for PFO closure.  I spoke with patient through interpreter [phone]) and patient is calling to schedule procedure.  Patient is available on 2/10.  Will schedule and call patient back PFO closure scheduled for 2/10 at 12:00. OK to follow up in office as scheduled on 2/18 I spoke with patient through interpreter [phone]) and went over instructions with her.  Will leave copy at front desk for patient to pick up

## 2023-08-08 ENCOUNTER — Other Ambulatory Visit: Payer: Self-pay | Admitting: *Deleted

## 2023-08-08 ENCOUNTER — Telehealth: Payer: Self-pay | Admitting: *Deleted

## 2023-08-08 DIAGNOSIS — Q2112 Patent foramen ovale: Secondary | ICD-10-CM

## 2023-08-08 DIAGNOSIS — Z01812 Encounter for preprocedural laboratory examination: Secondary | ICD-10-CM

## 2023-08-08 NOTE — Telephone Encounter (Signed)
 Requested medication (s) are due for refill today - yes  Requested medication (s) are on the active medication list -yes  Future visit scheduled -yes  Last refill: 11/09/22 #90 1RF  Notes to clinic: outside provider - sent for review   Requested Prescriptions  Pending Prescriptions Disp Refills   metoprolol  succinate (TOPROL  XL) 25 MG 24 hr tablet 90 tablet 1    Sig: Take 1 tablet (25 mg total) by mouth daily.     Cardiovascular:  Beta Blockers Passed - 08/08/2023  3:02 PM      Passed - Last BP in normal range    BP Readings from Last 1 Encounters:  07/27/23 103/67         Passed - Last Heart Rate in normal range    Pulse Readings from Last 1 Encounters:  07/27/23 70         Passed - Valid encounter within last 6 months    Recent Outpatient Visits           4 months ago Type 2 diabetes mellitus with other specified complication, with long-term current use of insulin  (HCC)   [place] [facility] [place]. [facility] Delbert Clam, MD   4 months ago Type 2 diabetes mellitus with other specified complication, with long-term current use of insulin  [facility])   [place] [facility] [place]. [facility] Fleeta Morris, [place] L, RPH-CPP   7 months ago Type 2 diabetes mellitus with other specified complication, with long-term current use of insulin  [facility])   [place] [facility] [place]. [facility] Fleeta Morris, [place] L, RPH-CPP   8 months ago Type 2 diabetes mellitus with other specified complication, with long-term current use of insulin  [facility])   [place] [facility] [place]. [facility] Fleeta Morris, [place] L, RPH-CPP   10 months ago Type 2 diabetes mellitus with other specified complication, with long-term current use of insulin  [facility])   [place] [facility] [place]. [facility] Delbert Clam, MD       Future  Appointments             In 2 weeks Ladona Heinz, MD [facility], [facility]   In 1 month Delbert Clam, MD [facility] [place] [facility] [place]. [facility]               Requested Prescriptions  Pending Prescriptions Disp Refills   metoprolol  succinate (TOPROL  XL) 25 MG 24 hr tablet 90 tablet 1    Sig: Take 1 tablet (25 mg total) by mouth daily.     Cardiovascular:  Beta Blockers Passed - 08/08/2023  3:02 PM      Passed - Last BP in normal range    BP Readings from Last 1 Encounters:  07/27/23 103/67         Passed - Last Heart Rate in normal range    Pulse Readings from Last 1 Encounters:  07/27/23 70         Passed - Valid encounter within last 6 months    Recent Outpatient Visits           4 months ago Type 2 diabetes mellitus with other specified complication, with long-term current use of insulin  (HCC)    [facility] Of [place]. [facility] Delbert Clam, MD   4 months ago Type 2 diabetes mellitus with other specified complication, with long-term current use of insulin  [facility])   [place] [facility] [place]. [facility] Fleeta Morris, Delmita L, RPH-CPP   7 months ago Type 2 diabetes mellitus with other specified complication, with long-term current use of insulin  [facility])   [place] [facility] [place]. [facility] Fleeta Morris, [place] L, RPH-CPP   8 months ago Type 2 diabetes mellitus with other specified complication, with long-term current use of insulin  [facility])   [place] [facility] [place]. [facility] Fleeta Morris, [place] L, RPH-CPP   10 months ago Type 2 diabetes mellitus with other specified complication, with long-term current use of insulin  [facility])   [place] [facility] [place]. [facility] Delbert Clam, MD       Future  Appointments             In 2 weeks Ladona Heinz, MD [facility], [facility]   In 1 month Delbert Clam, MD [facility] [place] [facility] Dept Of [place]. [facility]

## 2023-08-08 NOTE — Telephone Encounter (Signed)
PFO Closure scheduled 08/14/23-will need to update lab prior to procedure. With assistance of Spanish Interpreter [place] ID [id number] will go to [employer] 08/09/23 for BMP/CBC

## 2023-08-09 ENCOUNTER — Other Ambulatory Visit: Payer: Self-pay

## 2023-08-09 MED ORDER — METOPROLOL SUCCINATE ER 25 MG PO TB24
25.0000 mg | ORAL_TABLET | Freq: Every day | ORAL | 1 refills | Status: DC
  Filled 2023-08-21: qty 90, 90d supply, fill #0

## 2023-08-10 ENCOUNTER — Other Ambulatory Visit: Payer: Self-pay

## 2023-08-10 NOTE — Telephone Encounter (Addendum)
 PFO Closure scheduled at [facility] for: Monday August 14, 2023 12 Noon Arrival time [facility] Main Entrance A at: 10 AM  Nothing to eat after midnight prior to procedure, clear liquids until 5 AM day of procedure.  Medication instructions: -Hold:  Insulin -1/2 usual Insulin  dose HS prior to procedure  Insulin /Metformin /glipizide /Jardiance -AM of procedure  Patient reports she is not currently taking Jardiance .  Ozempic -pt reports she has not taken in more than 1 week -Other usual morning medications can be taken with sips of water including aspirin  81 mg and Plavix  75 mg.  Plan to go home the same day, you will only stay overnight if medically necessary.  You must have responsible adult to drive you home.  Someone must be with you the first 24 hours after you arrive home.  With assistance of Spanish Interpreter [place] ID [id number]-reviewed procedure instructions with patient.

## 2023-08-10 NOTE — Telephone Encounter (Signed)
 With assistance of Spanish Interpreter [place] ID [id number]: Patient tells me she is scheduled for a liver biopsy 08/29/23 and has been instructed to hold Plavix  and aspirin  1 week prior to biopsy starting 08/22/23.  Patient is asking if okay with Dr Ladona for her to hold Plavix  and aspirin  starting 08/22/23 prior to liver biopsy 08/29/23. Patient advised I will forward to Dr Ladona for his review and recommendations

## 2023-08-10 NOTE — Telephone Encounter (Signed)
 With assistance of Spanish Interpreter [place] ID [id number] Per Dr Ladona patient is aware that she will need to continue Plavix  for at least 1 month after PFO closure, so she can wait on PFO closure or delay liver biopsy for a least a month after PFO closure.  Patient states she will proceed with PFO Closure 08/14/23 and will delay liver biopsy for a least a month after PFO closure.

## 2023-08-10 NOTE — Telephone Encounter (Signed)
 Well, then she should wait on PFO closure as she will need Plavix  at least for one month. Or she can postpone biopsy for a month

## 2023-08-11 LAB — CBC
Hematocrit: 36.9 % (ref 34.0–46.6)
Hemoglobin: 11.2 g/dL (ref 11.1–15.9)
MCH: 23.7 pg — ABNORMAL LOW (ref 26.6–33.0)
MCHC: 30.4 g/dL — ABNORMAL LOW (ref 31.5–35.7)
MCV: 78 fL — ABNORMAL LOW (ref 79–97)
Platelets: 346 10*3/uL (ref 150–450)
RBC: 4.73 x10E6/uL (ref 3.77–5.28)
RDW: 14 % (ref 11.7–15.4)
WBC: 5.9 10*3/uL (ref 3.4–10.8)

## 2023-08-11 LAB — BASIC METABOLIC PANEL
BUN/Creatinine Ratio: 17 (ref 9–23)
BUN: 13 mg/dL (ref 6–24)
CO2: 21 mmol/L (ref 20–29)
Calcium: 9.3 mg/dL (ref 8.7–10.2)
Chloride: 103 mmol/L (ref 96–106)
Creatinine, Ser: 0.77 mg/dL (ref 0.57–1.00)
Glucose: 174 mg/dL — ABNORMAL HIGH (ref 70–99)
Potassium: 4.4 mmol/L (ref 3.5–5.2)
Sodium: 134 mmol/L (ref 134–144)
eGFR: 95 mL/min/{1.73_m2} (ref >59)

## 2023-08-14 ENCOUNTER — Other Ambulatory Visit: Payer: Self-pay

## 2023-08-14 ENCOUNTER — Ambulatory Visit (HOSPITAL_COMMUNITY)
Admission: RE | Admit: 2023-08-14 | Discharge: 2023-08-15 | Disposition: A | Discharge status: Home / Self Care | Payer: Medicaid Other | Source: Ambulatory Visit | Attending: Cardiology | Admitting: Cardiology

## 2023-08-14 ENCOUNTER — Encounter (HOSPITAL_COMMUNITY)
Admission: RE | Disposition: A | Discharge status: Home / Self Care | Payer: Self-pay | Source: Ambulatory Visit | Attending: Cardiology

## 2023-08-14 DIAGNOSIS — Z7902 Long term (current) use of antithrombotics/antiplatelets: Secondary | ICD-10-CM | POA: Diagnosis not present

## 2023-08-14 DIAGNOSIS — Z7985 Long-term (current) use of injectable non-insulin antidiabetic drugs: Secondary | ICD-10-CM | POA: Diagnosis not present

## 2023-08-14 DIAGNOSIS — Z7982 Long term (current) use of aspirin: Secondary | ICD-10-CM | POA: Insufficient documentation

## 2023-08-14 DIAGNOSIS — Z7984 Long term (current) use of oral hypoglycemic drugs: Secondary | ICD-10-CM | POA: Insufficient documentation

## 2023-08-14 DIAGNOSIS — Z8673 Personal history of transient ischemic attack (TIA), and cerebral infarction without residual deficits: Secondary | ICD-10-CM | POA: Diagnosis not present

## 2023-08-14 DIAGNOSIS — Z6841 Body Mass Index (BMI) 40.0 and over, adult: Secondary | ICD-10-CM | POA: Insufficient documentation

## 2023-08-14 DIAGNOSIS — Q2112 Patent foramen ovale: Secondary | ICD-10-CM | POA: Diagnosis not present

## 2023-08-14 DIAGNOSIS — E66813 Obesity, class 3: Secondary | ICD-10-CM | POA: Insufficient documentation

## 2023-08-14 DIAGNOSIS — Z87891 Personal history of nicotine dependence: Secondary | ICD-10-CM | POA: Diagnosis not present

## 2023-08-14 HISTORY — PX: PATENT FORAMEN OVALE(PFO) CLOSURE: CATH118300

## 2023-08-14 LAB — POCT ACTIVATED CLOTTING TIME
Activated Clotting Time: 210 s
Activated Clotting Time: 210 s
Activated Clotting Time: 262 s

## 2023-08-14 LAB — GLUCOSE, CAPILLARY
Glucose-Capillary: 178 mg/dL — ABNORMAL HIGH (ref 70–99)
Glucose-Capillary: 141 mg/dL — ABNORMAL HIGH (ref 70–99)
Glucose-Capillary: 70 mg/dL (ref 70–99)
Glucose-Capillary: 142 mg/dL — ABNORMAL HIGH (ref 70–99)

## 2023-08-14 SURGERY — PATENT FORAMEN OVALE (PFO) CLOSURE
Anesthesia: LOCAL

## 2023-08-14 MED ORDER — EMPAGLIFLOZIN 25 MG PO TABS
25.0000 mg | ORAL_TABLET | Freq: Every day | ORAL | Status: DC
  Administered 2023-08-15: 25 mg via ORAL
  Filled 2023-08-14: qty 1

## 2023-08-14 MED ORDER — CLOPIDOGREL BISULFATE 75 MG PO TABS
75.0000 mg | ORAL_TABLET | Freq: Every day | ORAL | Status: DC
  Administered 2023-08-15: 75 mg via ORAL
  Filled 2023-08-14: qty 1

## 2023-08-14 MED ORDER — SODIUM CHLORIDE 0.9 % WEIGHT BASED INFUSION: 1.0000 mL/kg/h | INTRAVENOUS | Status: DC

## 2023-08-14 MED ORDER — ATORVASTATIN CALCIUM 40 MG PO TABS
40.0000 mg | ORAL_TABLET | Freq: Every day | ORAL | Status: DC
  Administered 2023-08-14: 40 mg via ORAL
  Filled 2023-08-14: qty 1

## 2023-08-14 MED ORDER — CEFAZOLIN SODIUM-DEXTROSE 2-4 GM/100ML-% IV SOLN
2.0000 g | INTRAVENOUS | Status: AC
  Administered 2023-08-14: 2 g via INTRAVENOUS

## 2023-08-14 MED ORDER — FENTANYL CITRATE (PF) 100 MCG/2ML IJ SOLN
INTRAMUSCULAR | Status: AC
  Filled 2023-08-14: qty 2

## 2023-08-14 MED ORDER — GLIPIZIDE 10 MG PO TABS
10.0000 mg | ORAL_TABLET | Freq: Two times a day (BID) | ORAL | Status: DC
  Administered 2023-08-15: 10 mg via ORAL
  Filled 2023-08-14 (×2): qty 1

## 2023-08-14 MED ORDER — LINACLOTIDE 145 MCG PO CAPS
145.0000 ug | ORAL_CAPSULE | Freq: Every day | ORAL | Status: DC
  Administered 2023-08-15: 145 ug via ORAL
  Filled 2023-08-14: qty 1

## 2023-08-14 MED ORDER — ASPIRIN 81 MG PO CHEW: 81.0000 mg | CHEWABLE_TABLET | ORAL | Status: DC

## 2023-08-14 MED ORDER — SODIUM CHLORIDE 0.9% FLUSH: 3.0000 mL | INTRAVENOUS | Status: DC | PRN

## 2023-08-14 MED ORDER — CEFAZOLIN SODIUM-DEXTROSE 2-4 GM/100ML-% IV SOLN
INTRAVENOUS | Status: AC
  Filled 2023-08-14: qty 100

## 2023-08-14 MED ORDER — FENTANYL CITRATE (PF) 100 MCG/2ML IJ SOLN
INTRAMUSCULAR | Status: AC
Start: 2023-08-14 — End: ?
  Filled 2023-08-14: qty 2

## 2023-08-14 MED ORDER — HEPARIN SODIUM (PORCINE) 1000 UNIT/ML IJ SOLN
INTRAMUSCULAR | Status: AC
  Filled 2023-08-14: qty 10

## 2023-08-14 MED ORDER — FENTANYL CITRATE (PF) 100 MCG/2ML IJ SOLN
INTRAMUSCULAR | Status: DC | PRN
  Administered 2023-08-14 (×3): 50 ug via INTRAVENOUS

## 2023-08-14 MED ORDER — MIDAZOLAM HCL 2 MG/2ML IJ SOLN
INTRAMUSCULAR | Status: AC
  Filled 2023-08-14: qty 2

## 2023-08-14 MED ORDER — GABAPENTIN 300 MG PO CAPS
300.0000 mg | ORAL_CAPSULE | Freq: Three times a day (TID) | ORAL | Status: DC
Start: 2023-08-14 — End: 2023-08-15
  Administered 2023-08-14 – 2023-08-15 (×2): 300 mg via ORAL
  Filled 2023-08-14 (×2): qty 1

## 2023-08-14 MED ORDER — DEXTROSE 50 % IV SOLN: 25.0000 mL | Freq: Once | INTRAVENOUS | Status: AC

## 2023-08-14 MED ORDER — METFORMIN HCL 500 MG PO TABS
1000.0000 mg | ORAL_TABLET | Freq: Two times a day (BID) | ORAL | Status: DC
  Administered 2023-08-15: 1000 mg via ORAL
  Filled 2023-08-14: qty 2

## 2023-08-14 MED ORDER — PANTOPRAZOLE SODIUM 40 MG PO TBEC
40.0000 mg | DELAYED_RELEASE_TABLET | Freq: Every day | ORAL | Status: DC
  Administered 2023-08-15: 40 mg via ORAL
  Filled 2023-08-14: qty 1

## 2023-08-14 MED ORDER — ASPIRIN 81 MG PO CHEW
81.0000 mg | CHEWABLE_TABLET | Freq: Every day | ORAL | Status: DC
  Administered 2023-08-15: 81 mg via ORAL
  Filled 2023-08-14: qty 1

## 2023-08-14 MED ORDER — METOPROLOL SUCCINATE ER 25 MG PO TB24
25.0000 mg | ORAL_TABLET | Freq: Every day | ORAL | Status: DC
  Administered 2023-08-15: 25 mg via ORAL
  Filled 2023-08-14: qty 1

## 2023-08-14 MED ORDER — MIDAZOLAM HCL 2 MG/2ML IJ SOLN
INTRAMUSCULAR | Status: DC | PRN
  Administered 2023-08-14 (×2): 2 mg via INTRAVENOUS

## 2023-08-14 MED ORDER — SODIUM CHLORIDE 0.9 % IV SOLN: 250.0000 mL | INTRAVENOUS | Status: DC | PRN

## 2023-08-14 MED ORDER — LIDOCAINE HCL (PF) 1 % IJ SOLN
INTRAMUSCULAR | Status: AC
  Filled 2023-08-14: qty 30

## 2023-08-14 MED ORDER — ONDANSETRON HCL 4 MG/2ML IJ SOLN: 4.0000 mg | Freq: Four times a day (QID) | INTRAMUSCULAR | Status: DC | PRN

## 2023-08-14 MED ORDER — DEXTROSE 50 % IV SOLN
INTRAVENOUS | Status: AC
  Administered 2023-08-14: 25 mL via INTRAVENOUS
  Filled 2023-08-14: qty 50

## 2023-08-14 MED ORDER — LIDOCAINE HCL (PF) 1 % IJ SOLN
INTRAMUSCULAR | Status: DC | PRN
  Administered 2023-08-14: 12 mL

## 2023-08-14 MED ORDER — HEPARIN SODIUM (PORCINE) 1000 UNIT/ML IJ SOLN
INTRAMUSCULAR | Status: DC | PRN
  Administered 2023-08-14: 2000 [IU] via INTRAVENOUS
  Administered 2023-08-14: 9000 [IU] via INTRAVENOUS
  Administered 2023-08-14: 3000 [IU] via INTRAVENOUS

## 2023-08-14 MED ORDER — HEPARIN (PORCINE) IN NACL 1000-0.9 UT/500ML-% IV SOLN
INTRAVENOUS | Status: DC | PRN
  Administered 2023-08-14: 500 mL

## 2023-08-14 MED ORDER — TIZANIDINE HCL 4 MG PO TABS
4.0000 mg | ORAL_TABLET | Freq: Three times a day (TID) | ORAL | Status: DC | PRN
  Administered 2023-08-14: 4 mg via ORAL
  Filled 2023-08-14: qty 1

## 2023-08-14 MED ORDER — LINACLOTIDE 145 MCG PO CAPS: 290.0000 ug | ORAL_CAPSULE | Freq: Every day | ORAL | Status: DC

## 2023-08-14 MED ORDER — SODIUM CHLORIDE 0.9 % WEIGHT BASED INFUSION: 3.0000 mL/kg/h | INTRAVENOUS | Status: AC

## 2023-08-14 MED ORDER — HYDROMORPHONE HCL 1 MG/ML IJ SOLN
0.5000 mg | Freq: Once | INTRAMUSCULAR | Status: AC
  Administered 2023-08-14: 0.5 mg via INTRAVENOUS
  Filled 2023-08-14: qty 1

## 2023-08-14 MED ORDER — CLOPIDOGREL BISULFATE 75 MG PO TABS: 75.0000 mg | ORAL_TABLET | ORAL | Status: DC

## 2023-08-14 MED ORDER — ACETAMINOPHEN 325 MG PO TABS
650.0000 mg | ORAL_TABLET | Freq: Four times a day (QID) | ORAL | Status: DC | PRN
  Administered 2023-08-14 – 2023-08-15 (×3): 650 mg via ORAL
  Filled 2023-08-14 (×3): qty 2

## 2023-08-14 MED ORDER — INSULIN GLARGINE-YFGN 100 UNIT/ML ~~LOC~~ SOLN
40.0000 [IU] | Freq: Every day | SUBCUTANEOUS | Status: DC
  Administered 2023-08-14: 40 [IU] via SUBCUTANEOUS
  Filled 2023-08-14 (×2): qty 0.4

## 2023-08-14 SURGICAL SUPPLY — 15 items
CATH ACUNAV 8FR 90CM (CATHETERS) IMPLANT
CATH INFINITI 5 FR MPA2 (CATHETERS) IMPLANT
CATH INFINITI 5FR MPB2 (CATHETERS) IMPLANT
CLOSURE PERCLOSE PROSTYLE (VASCULAR PRODUCTS) IMPLANT
GUIDEWIRE SAFE TJ AMPLATZ EXST (WIRE) IMPLANT
OCCLUDER CARDIOFORM SEPTAL 25 (Prosthesis & Implant Heart) IMPLANT
OCCLUDER CARDIOFORM SEPTAL 30 (Prosthesis & Implant Heart) IMPLANT
PACK CARDIAC CATHETERIZATION (CUSTOM PROCEDURE TRAY) ×1 IMPLANT
SHEATH FAST CATH 12F 12CM (SHEATH) IMPLANT
SHEATH INTROD W/O MIN 9FR 25CM (SHEATH) IMPLANT
SHEATH PINNACLE 5F 10CM (SHEATH) IMPLANT
SHEATH PROBE COVER 6X72 (BAG) IMPLANT
WIRE EMERALD 3MM-J .035X260CM (WIRE) IMPLANT
WIRE MICRO SET SILHO 5FR 7 (SHEATH) IMPLANT
WIRE MICROINTRODUCER 60CM (WIRE) IMPLANT

## 2023-08-14 NOTE — Interval H&P Note (Signed)
 History and Physical Interval Note:  08/14/2023 5:41 PM  Julie Robertson  has presented today for surgery, with the diagnosis of pfo.  The various methods of treatment have been discussed with the patient and family. After consideration of risks, benefits and other options for treatment, the patient has consented to  Procedure(s): PATENT FORAMEN OVALE(PFO) CLOSURE (N/A) as a surgical intervention.  The patient's history has been reviewed, patient examined, no change in status, stable for surgery.  I have reviewed the patient's chart and labs.  Questions were answered to the patient's satisfaction.     Knox Perl

## 2023-08-14 NOTE — Progress Notes (Signed)
 Pt right femoral site bled at 2210. Put pressure on it for 50 minutes. Applied pressure dressing. Paged doctor about 10/10 back pain from bed rest. Told Pt bedrest 4 hours will restart and I would page doctor for pain medicine. Will continue to monitor.

## 2023-08-15 ENCOUNTER — Ambulatory Visit (HOSPITAL_COMMUNITY): Payer: Medicaid Other

## 2023-08-15 ENCOUNTER — Other Ambulatory Visit: Payer: Self-pay

## 2023-08-15 ENCOUNTER — Encounter (HOSPITAL_COMMUNITY): Payer: Self-pay | Admitting: Cardiology

## 2023-08-15 DIAGNOSIS — Q2112 Patent foramen ovale: Secondary | ICD-10-CM

## 2023-08-15 LAB — ECHOCARDIOGRAM LIMITED
Height: 64 in
S' Lateral: 2.9 cm
Weight: 3777.8 [oz_av]

## 2023-08-15 LAB — GLUCOSE, CAPILLARY: Glucose-Capillary: 156 mg/dL — ABNORMAL HIGH (ref 70–99)

## 2023-08-15 MED ORDER — HYDROMORPHONE HCL 1 MG/ML IJ SOLN
0.5000 mg | Freq: Once | INTRAMUSCULAR | Status: AC
  Administered 2023-08-15: 0.5 mg via INTRAVENOUS
  Filled 2023-08-15: qty 1

## 2023-08-15 MED ORDER — TRAMADOL HCL 50 MG PO TABS
50.0000 mg | ORAL_TABLET | Freq: Four times a day (QID) | ORAL | 0 refills | Status: AC | PRN
  Filled 2023-08-15: qty 10, 3d supply, fill #0

## 2023-08-15 MED ORDER — OXYCODONE HCL 5 MG PO TABS: 5.0000 mg | ORAL_TABLET | Freq: Four times a day (QID) | ORAL | Status: DC | PRN

## 2023-08-15 MED ORDER — LORATADINE 10 MG PO TABS: 10.0000 mg | ORAL_TABLET | Freq: Every day | ORAL | Status: AC | PRN

## 2023-08-15 NOTE — Plan of Care (Signed)

## 2023-08-15 NOTE — Discharge Summary (Addendum)
Discharge Summary    Patient ID: Julie Robertson MRN: [id number]; DOB: 01/13/1976  Admit date: 08/14/2023 Discharge date: 08/15/2023  PCP:  Hoy Register, MD   [facility] Providers Cardiologist:  Tessa Lerner, DO     Discharge Diagnoses    Principal Problem:   PFO (patent foramen ovale)  Diagnostic Studies/Procedures    PFO closure: 08/14/2023    Recommend uninterrupted dual antiplatelet therapy with Aspirin 81mg  daily and Clopidogrel 75mg  daily for a minimum of 1 month (bare metal stent-Class I recommendation).   PFO closure 08/14/2023: Successful closure of the PFO with implantation of a 30 mm Cardioform Septal Occluder.  No complications. Home tomorrow due to late procedure.  _____________   History of Present Illness     Julie Robertson is a 48 y.o. female with with h/o left occipital lobe stroke in June 2023, prior infarct right corona radiata (per imaging), confirmed PFO by TCD and also TEE in June 2023, insulin dependent type 2 diabetes, dyslipidemia, GERD, former smoker (38 pack year history), sleep apnea on device therapy, obesity, CAD with current coronary angioplasty to LCx in February 2024 presents to discuss ASD repair. She was seen in the office on 07/19/2023 with Dr. Jacinto Halim to discuss option of PFO closure which was arranged as an outpatient.   Hospital Course     PFO -- s/p PFO closure using 30 mm Cardioform Septal Occluder with Dr. Jacinto Halim 2/10. No complications post procedure. Recommendations for ASA/plavix for one month. Monitored overnight given late procedure. Limited echo reviewed by MD prior to discharge with stable device, formal read pending. C/o of lower back post op, will send Rx for tramadol 50mg  PRN, #10tabs. [place] drug registry reviewed prior to sending Rx.   General: Well developed, well nourished, female appearing in no acute distress. Head: Normocephalic, atraumatic.  Neck: Supple without bruits, JVD. Lungs:   Resp regular and unlabored, CTA. Heart: RRR, S1, S2, no S3, S4, or murmur; no rub. Abdomen: Soft, non-tender, non-distended with normoactive bowel sounds. No hepatomegaly. No rebound/guarding. No obvious abdominal masses. Extremities: No clubbing, cyanosis, edema. Distal pedal pulses are 2+ bilaterally. Left femoral cath site stable without bruising or hematoma Neuro: Alert and oriented X 3. Moves all extremities spontaneously. Psych: Normal affect.  Patient seen by myself and Dr. Excell Seltzer, deemed stable for discharge home. Follow up arranged in the office.  _____________  Discharge Vitals Blood pressure (!) 102/52, pulse 92, temperature 98.4 F (36.9 C), temperature source Oral, resp. rate 14, height 5\' 4"  (1.626 m), weight 107.1 kg, SpO2 96%.  Filed Weights   08/14/23 1052 08/14/23 2003  Weight: 104.3 kg 107.1 kg    Labs & Radiologic Studies    CBC No results for input(s): "WBC", "NEUTROABS", "HGB", "HCT", "MCV", "PLT" in the last 72 hours. Basic Metabolic Panel No results for input(s): "NA", "K", "CL", "CO2", "GLUCOSE", "BUN", "CREATININE", "CALCIUM", "MG", "PHOS" in the last 72 hours. Liver Function Tests No results for input(s): "AST", "ALT", "ALKPHOS", "BILITOT", "PROT", "ALBUMIN" in the last 72 hours. No results for input(s): "LIPASE", "AMYLASE" in the last 72 hours. High Sensitivity Troponin:   No results for input(s): "TROPONINIHS" in the last 720 hours.  BNP Invalid input(s): "POCBNP" D-Dimer No results for input(s): "DDIMER" in the last 72 hours. Hemoglobin A1C No results for input(s): "HGBA1C" in the last 72 hours. Fasting Lipid Panel No results for input(s): "CHOL", "HDL", "LDLCALC", "TRIG", "CHOLHDL", "LDLDIRECT" in the last 72 hours. Thyroid Function Tests No results for input(s): "TSH", "  T4TOTAL", "T3FREE", "THYROIDAB" in the last 72 hours.  Invalid input(s): "FREET3" _____________  CARDIAC CATHETERIZATION Result Date: 08/14/2023   Recommend uninterrupted  dual antiplatelet therapy with Aspirin 81mg  daily and Clopidogrel 75mg  daily for a minimum of 1 month (bare metal stent-Class I recommendation). PFO closure 08/14/2023: Successful closure of the PFO with implantation of a 30 mm Cardioform Septal Occluder. No complications. Home tomorrow due to late procedure.   VAS [place] LOWER EXTREMITY VENOUS REFLUX Result Date: 08/07/2023  Lower Venous Reflux Study Patient Name:  Julie Robertson [facility]  Date of Exam:   08/07/2023 Medical Rec #: [id number]                        Accession #:    [id number] Date of Birth: 1976/02/22                        Patient Gender: F Patient Age:   2 years Exam Location:  [facility] Procedure:      VAS [place] LOWER EXTREMITY VENOUS REFLUX Referring Phys: Coral Else --------------------------------------------------------------------------------  Indications: Varicosities Right leg.  Performing Technologist: Dorthula Matas RVS, RCS  Examination Guidelines: A complete evaluation includes B-mode imaging, spectral Doppler, color Doppler, and power Doppler as needed of all accessible portions of each vessel. Bilateral testing is considered an integral part of a complete examination. Limited examinations for reoccurring indications may be performed as noted. The reflux portion of the exam is performed with the patient in reverse Trendelenburg. Significant venous reflux is defined as >500 ms in the superficial venous system, and >1 second in the deep venous system.  Venous Reflux Times +------------------+---------+------+-----------+------------+--------+ RIGHT             Reflux NoRefluxReflux TimeDiameter cmsComments                             Yes                                  +------------------+---------+------+-----------+------------+--------+ CFV                         yes   >1 second                      +------------------+---------+------+-----------+------------+--------+ FV mid             no                                             +------------------+---------+------+-----------+------------+--------+ Popliteal                   yes   >1 second                      +------------------+---------+------+-----------+------------+--------+ GSV at SFJ                  yes    >500 ms      0.91             +------------------+---------+------+-----------+------------+--------+ GSV prox thigh    no  0.52             +------------------+---------+------+-----------+------------+--------+ GSV mid thigh     no                            0.39             +------------------+---------+------+-----------+------------+--------+ GSV dist thigh    no                            0.34             +------------------+---------+------+-----------+------------+--------+ GSV at knee       no                            0.43             +------------------+---------+------+-----------+------------+--------+ SSV Pop Fossa     no                            0.21             +------------------+---------+------+-----------+------------+--------+ anterior accessoryno                            0.43             +------------------+---------+------+-----------+------------+--------+   Summary: Right: - No evidence of deep vein thrombosis from the common femoral through the popliteal veins. - No evidence of superficial venous thrombosis. - The deep venous system is not competent. - The great and small saphenous veins are competent.  *See table(s) above for measurements and observations. Electronically signed by Coral Else MD on 08/07/2023 at 1:39:38 PM.    Final    CARDIAC CATHETERIZATION Result Date: 07/27/2023 PFO closure abandoned today due to access issue.  Although used ultrasound guidance, micropuncture technique, in view of morbid obesity, large pannus, I have entered through artery and accessed the vein and hence manual pressure  was held to prevent complications at the groin site and potential for AV fistula and also bleeding complications.  Patient will be scheduled at a later date for PFO closure.   Disposition   Pt is being discharged home today in good condition.  Follow-up Plans & Appointments     Discharge Instructions     Call MD for:  difficulty breathing, headache or visual disturbances   Complete by: As directed    Call MD for:  persistant dizziness or light-headedness   Complete by: As directed    Call MD for:  redness, tenderness, or signs of infection (pain, swelling, redness, odor or green/yellow discharge around incision site)   Complete by: As directed    Diet - low sodium heart healthy   Complete by: As directed    Discharge instructions   Complete by: As directed    Groin Site Care Refer to this sheet in the next few weeks. These instructions provide you with information on caring for yourself after your procedure. Your caregiver may also give you more specific instructions. Your treatment has been planned according to current medical practices, but problems sometimes occur. Call your caregiver if you have any problems or questions after your procedure. HOME CARE INSTRUCTIONS You may shower 24 hours after the procedure. Remove the bandage (dressing) and gently wash the site with plain soap and water.  Gently pat the site dry.  Do not apply powder or lotion to the site.  Do not sit in a bathtub, swimming pool, or whirlpool for 5 to 7 days.  No bending, squatting, or lifting anything over 10 pounds (4.5 kg) as directed by your caregiver.  Inspect the site at least twice daily.  Do not drive home if you are discharged the same day of the procedure. Have someone else drive you.  You may drive 24 hours after the procedure unless otherwise instructed by your caregiver.  What to expect: Any bruising will usually fade within 1 to 2 weeks.  Blood that collects in the tissue (hematoma) may be painful  to the touch. It should usually decrease in size and tenderness within 1 to 2 weeks.  SEEK IMMEDIATE MEDICAL CARE IF: You have unusual pain at the groin site or down the affected leg.  You have redness, warmth, swelling, or pain at the groin site.  You have drainage (other than a small amount of blood on the dressing).  You have chills.  You have a fever or persistent symptoms for more than 72 hours.  You have a fever and your symptoms suddenly get worse.  Your leg becomes pale, cool, tingly, or numb.  You have heavy bleeding from the site. Hold pressure on the site. Marland Kitchen  PLEASE DO NOT MISS ANY DOSES OF YOUR PLAVIX!!!!! Also keep a log of you blood pressures and bring back to your follow up appt. Please call the office with any questions.   Patients taking blood thinners should generally stay away from medicines like ibuprofen, Advil, Motrin, naproxen, and Aleve due to risk of stomach bleeding. You may take Tylenol as directed or talk to your primary doctor about alternatives.  Some studies suggest Prilosec/Omeprazole interacts with Plavix. We changed your Prilosec/Omeprazole to the equivalent dose of Protonix for less chance of interaction.  PLEASE ENSURE THAT YOU DO NOT RUN OUT OF YOU PLAVIX. IF you have issues obtaining this medication due to cost please CALL the office 3-5 business days prior to running out in order to prevent missing doses of this medication.   Increase activity slowly   Complete by: As directed         Discharge Medications   Allergies as of 08/15/2023   No Known Allergies      Medication List     TAKE these medications    Accu-Chek Guide Test test strip Generic drug: glucose blood Use to check blood sugar three times daily.   Accu-Chek Guide w/Device Kit Use up to four times daily as directed.   Accu-Chek Softclix Lancets lancets Use como se indica. (use as directed)   aspirin 81 MG chewable tablet Chew 1 tablet (81 mg total) by mouth daily.    atorvastatin 40 MG tablet Commonly known as: Lipitor Tome 1 tableta (40 mg en total) por va oral antes de acostarse. (Take 1 tablet (40 mg total) by mouth at bedtime.)   Basaglar KwikPen 100 UNIT/ML Inject 38 Units into the skin daily. What changed:  how much to take when to take this   clopidogrel 75 MG tablet Commonly known as: PLAVIX Tome 1 tableta (75 mg en total) por va oral diariamente. (Take 1 tablet (75 mg total) by mouth daily.)   fenofibrate 145 MG tablet Commonly known as: TRICOR Take 1 tablet (145 mg total) by mouth daily.   FreeStyle [employer] 3 Sensor Misc Use como se indica. (USE AS DIRECTED)   gabapentin 300  MG capsule Commonly known as: NEURONTIN Take 1 capsule (300 mg total) by mouth 3 (three) times daily.   glipiZIDE 10 MG tablet Commonly known as: GLUCOTROL Take 1 tablet (10 mg total) by mouth 2 (two) times daily.   Jardiance 25 MG Tabs tablet Generic drug: empagliflozin Tome 1 tableta (25 mg en total) por va oral diariamente antes de desayunar. (Take 1 tablet (25 mg total) by mouth daily before breakfast.)   latanoprost 0.005 % ophthalmic solution Commonly known as: XALATAN Place 1 drop into both eyes every evening.   linaclotide 145 MCG Caps capsule Commonly known as: LINZESS Take 145 mcg by mouth daily before breakfast. Notes to patient: When you start taking Linaclotide daily, discontinue the 290mg  capsule    Linzess 290 MCG Caps capsule Generic drug: linaclotide Take 1 capsule (290 mcg total) by mouth daily before breakfast. Notes to patient: When you start taking Linaclotide daily, discontinue the 290mg  capsule    loratadine 10 MG tablet Commonly known as: CLARITIN Take 1 tablet (10 mg total) by mouth daily as needed for allergies.   metFORMIN 500 MG tablet Commonly known as: GLUCOPHAGE Take 2 tablets (1,000 mg total) by mouth 2 (two) times daily with a meal.   metoCLOPramide 10 MG tablet Commonly known as:  REGLAN Take 1 tablet at 5am the morning of surgery and then every 6 to 8 hours as needed for nausea. What changed: when to take this   metoprolol succinate 25 MG 24 hr tablet Commonly known as: Toprol XL Tome 1 tableta (25 mg en total) por va oral diariamente. (Take 1 tablet (25 mg total) by mouth daily.)   ondansetron 8 MG disintegrating tablet Commonly known as: ZOFRAN-ODT Take one tablet (8 mg dose) by mouth every 8 (eight) hours as needed for Nausea for up to 20 days.   Ozempic (2 MG/DOSE) 8 MG/3ML Sopn Generic drug: Semaglutide (2 MG/DOSE) Inject 2 mg as directed once a week.   pantoprazole 40 MG tablet Commonly known as: PROTONIX Tome 1 tableta (40 mg en total) por va oral diariamente antes de desayunar. (Stop taking omeprazole). (Take 1 tablet (40 mg total) by mouth daily before breakfast. (Stop taking omeprazole).)   tiZANidine 4 MG tablet Commonly known as: Zanaflex Take 1 tablet (4 mg total) by mouth every 8 (eight) hours as needed for muscle spasms.   traMADol 50 MG tablet Commonly known as: Ultram Take 1 tablet (50 mg total) by mouth every 6 (six) hours as needed.   True Metrix Meter w/Device Kit Use as directed         Outstanding Labs/Studies   N/a   Duration of Discharge Encounter: APP Time: 20 minutes   Signed, Laverda Page, NP 08/15/2023, 11:13 AM  Patient seen, examined. Available data reviewed. Agree with findings, assessment, and plan as outlined by Laverda Page, NP.  Patient independently interviewed and examined.  Visit is conducted with the use of the translator video monitor.  On my exam, the patient is alert, oriented, in no distress.  HEENT is normal, JVP is normal, lungs are clear, heart is regular rate and rhythm no murmur gallop, right groin site is clear with a pressure dressing in place and this is dry with no evidence of oozing, hematoma, or ecchymosis.  Lower extremities have no edema.  The patient's echo images are personally  reviewed and show no evidence of pericardial effusion.  The PFO occluder device is in position and there is no residual shunt by color-flow Doppler.  The formal echo interpretation is currently pending.  I think the patient is medically stable for discharge today on aspirin and clopidogrel to be continued for 6 months.  She is having some lower back pain since the procedure was done and Tylenol has provided suboptimal pain relief.  Will write her a short prescription for tramadol to be used as needed.  Otherwise, as outlined above. MD Time conducting this discharge = 15 minutes.   Tonny Bollman, M.D. 08/15/2023 12:38 PM

## 2023-08-15 NOTE — Progress Notes (Signed)
Echocardiogram 2D Echocardiogram has been performed.  Julie Robertson 08/15/2023, 10:23 AM

## 2023-08-17 ENCOUNTER — Encounter: Payer: Self-pay | Admitting: Physician Assistant

## 2023-08-17 ENCOUNTER — Ambulatory Visit: Payer: Medicaid Other | Admitting: Physician Assistant

## 2023-08-17 VITALS — BP 90/60 | HR 94 | Temp 97.7°F | Ht 64.0 in | Wt 225.7 lb

## 2023-08-17 DIAGNOSIS — I872 Venous insufficiency (chronic) (peripheral): Secondary | ICD-10-CM

## 2023-08-17 DIAGNOSIS — M7989 Other specified soft tissue disorders: Secondary | ICD-10-CM

## 2023-08-17 NOTE — Progress Notes (Signed)
[facility] [place]   Reason for referral: spider veins, medial upper thigh palpable vein  History of Present Illness  Julie Robertson is a 48 y.o. female who presents with chief complaint: swollen leg.  Patient notes, onset of palpable veins 1 year ago, associated with nothing.  The patient has had no history of DVT, positive history of varicose vein, no history of venous stasis ulcers, no history of  Lymphedema and no history of skin changes in lower legs.  She does not like the look of the spider veins on both legs. There is no family history of venous disorders.  The patient has not used compression stockings in the past.  She states she has discomfort with enlarged veins medial upper thighs.  She denies swollen legs, claudication, rest pain or non healing wounds.  She recently had cardiac procedure by Dr. 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  She was placed on dual antiplatelet therapy with ASA and Plavix for 1 month.      Past Medical History:  Diagnosis Date   Anxiety    Asthma    Coronary artery disease    CVA (cerebral vascular accident) (HCC)    Diabetes mellitus without complication (HCC)    Diabetic neuropathy (HCC) 12/2013   vs carpal tunnell.  numbness tingling in right fingers. rx with Gabapentin.   Dyslipidemia 08/2009   Dyspnea    Gall stones 08/2009   GERD (gastroesophageal reflux disease)    H/O tubal ligation    Obesity    BMI 41, 250# 03/2015    Past Surgical History:  Procedure Laterality Date   APPLICATION OF WOUND VAC N/A 05/09/2019   Procedure: APPLICATION OF WOUND VAC;  Surgeon: Henrene Dodge, MD;  Location: [facility] ORS;  Service: General;  Laterality: N/A;  [id number]   BUBBLE STUDY  12/24/2021   Procedure: BUBBLE STUDY;  Surgeon: Lewayne Bunting, MD;  Location: [facility] ENDOSCOPY;  Service: Cardiovascular;;   CHOLECYSTECTOMY N/A 03/10/2015   Procedure: LAPAROSCOPIC CHOLECYSTECTOMY WITH INTRAOPERATIVE CHOLANGIOGRAM;  Surgeon:  Violeta Gelinas, MD;  Location: [facility] OR;  Service: General;  Laterality: N/A;   CORONARY BALLOON ANGIOPLASTY N/A 08/09/2022   Procedure: CORONARY BALLOON ANGIOPLASTY;  Surgeon: Yates Decamp, MD;  Location: [facility] INVASIVE CV LAB;  Service: Cardiovascular;  Laterality: N/A;   ERCP N/A 03/11/2015   Procedure: ENDOSCOPIC RETROGRADE CHOLANGIOPANCREATOGRAPHY (ERCP);  Surgeon: Rachael Fee, MD;  Location: [facility] ENDOSCOPY;  Service: Endoscopy;  Laterality: N/A;   ESOPHAGOGASTRODUODENOSCOPY (EGD) WITH PROPOFOL N/A 07/25/2018   Procedure: ESOPHAGOGASTRODUODENOSCOPY (EGD) WITH PROPOFOL;  Surgeon: Meridee Score Netty Starring., MD;  Location: [facility] ENDOSCOPY;  Service: Gastroenterology;  Laterality: N/A;   INSERTION OF MESH N/A 05/09/2019   Procedure: INSERTION OF MESH;  Surgeon: Henrene Dodge, MD;  Location: [facility] ORS;  Service: General;  Laterality: N/A;   LEFT HEART CATH AND CORONARY ANGIOGRAPHY N/A 08/09/2022   Procedure: LEFT HEART CATH AND CORONARY ANGIOGRAPHY;  Surgeon: Yates Decamp, MD;  Location: [facility] INVASIVE CV LAB;  Service: Cardiovascular;  Laterality: N/A;   PATENT FORAMEN OVALE(PFO) CLOSURE N/A 07/27/2023   Procedure: PATENT FORAMEN OVALE(PFO) CLOSURE;  Surgeon: Yates Decamp, MD;  Location: [facility] INVASIVE CV LAB;  Service: Cardiovascular;  Laterality: N/A;   PATENT FORAMEN OVALE(PFO) CLOSURE N/A 08/14/2023   Procedure: PATENT FORAMEN OVALE(PFO) CLOSURE;  Surgeon: Yates Decamp, MD;  Location: [facility] INVASIVE CV LAB;  Service: Cardiovascular;  Laterality: N/A;   TEE WITHOUT CARDIOVERSION N/A 12/24/2021   Procedure: TRANSESOPHAGEAL ECHOCARDIOGRAM (TEE);  Surgeon: Lewayne Bunting,  MD;  Location: [facility] ENDOSCOPY;  Service: Cardiovascular;  Laterality: N/A;   Transduodenal Ampullectomy  09/2015   TUBAL LIGATION     VENTRAL HERNIA REPAIR N/A 05/09/2019   Procedure: HERNIA REPAIR VENTRAL ADULT with MESH;  Surgeon: Henrene Dodge, MD;  Location: [facility] ORS;  Service: General;  Laterality: N/A;    Social History   Socioeconomic History   Marital  status: Married    Spouse name: Not on file   Number of children: 2   Years of education: Not on file   Highest education level: Not on file  Occupational History   Occupation: house cleaning  Tobacco Use   Smoking status: Former    Current packs/day: 1.50    Average packs/day: 1.5 packs/day for 4.0 years (6.0 ttl pk-yrs)    Types: Cigarettes    Passive exposure: Never   Smokeless tobacco: Never  Vaping Use   Vaping status: Never Used  Substance and Sexual Activity   Alcohol use: Yes    Comment: rare   Drug use: No   Sexual activity: Not Currently  Other Topics Concern   Not on file  Social History Narrative   Patient has 2 sons one is 59, the other 27 as of 03/2015. Her kids are not the children of her current husband. As of 03/2015 she is not employed outside the home.   Social Drivers of Corporate investment banker Strain: Low Risk  (03/22/2023)   Received from [facility]   Overall Financial Resource Strain (CARDIA)    Difficulty of Paying Living Expenses: Not hard at all  Food Insecurity: No Food Insecurity (08/14/2023)   Hunger Vital Sign    Worried About Running Out of Food in the Last Year: Never true    Ran Out of Food in the Last Year: Never true  Transportation Needs: No Transportation Needs (08/14/2023)   PRAPARE - Administrator, Civil Service (Medical): No    Lack of Transportation (Non-Medical): No  Physical Activity: Inactive (09/23/2022)   Exercise Vital Sign    Days of Exercise per Week: 0 days    Minutes of Exercise per Session: 0 min  Stress: No Stress Concern Present (09/23/2022)   [employer] of Occupational Health - Occupational Stress Questionnaire    Feeling of Stress : Only a little  Social Connections: Moderately Isolated (08/14/2023)   Social Connection and Isolation Panel [NHANES]    Frequency of Communication with Friends and Family: More than three times a week    Frequency of Social Gatherings with Friends and Family: More  than three times a week    Attends Religious Services: Never    Database administrator or Organizations: No    Attends Banker Meetings: Never    Marital Status: Married  Catering manager Violence: Not At Risk (08/14/2023)   Humiliation, Afraid, Rape, and Kick questionnaire    Fear of Current or Ex-Partner: No    Emotionally Abused: No    Physically Abused: No    Sexually Abused: No    Family History  Adopted: Yes  Problem Relation Age of Onset   Diabetes Mother    Heart disease Mother    Hypertension Mother    Heart attack Father    Diabetes Father    Kidney disease Maternal Uncle    Bone cancer Maternal Grandfather    Other Son        had kidney removed due to gun shot wound    Current  Outpatient Medications on File Prior to Visit  Medication Sig Dispense Refill   aspirin 81 MG chewable tablet Chew 1 tablet (81 mg total) by mouth daily. 90 tablet 1   atorvastatin (LIPITOR) 40 MG tablet Take 1 tablet (40 mg total) by mouth at bedtime. 90 tablet 3   blood glucose meter kit and supplies KIT Use up to four times daily as directed. 1 each 11   Blood Glucose Monitoring Suppl (TRUE METRIX METER) w/Device KIT Use as directed 1 kit 0   clopidogrel (PLAVIX) 75 MG tablet Take 1 tablet (75 mg total) by mouth daily. 90 tablet 3   Continuous Glucose Sensor (FREESTYLE LIBRE 3 SENSOR) MISC USE AS DIRECTED 2 each 3   empagliflozin (JARDIANCE) 25 MG TABS tablet Take 1 tablet (25 mg total) by mouth daily before breakfast. 90 tablet 1   fenofibrate (TRICOR) 145 MG tablet Take 1 tablet (145 mg total) by mouth daily. 90 tablet 2   gabapentin (NEURONTIN) 300 MG capsule Take 1 capsule (300 mg total) by mouth 3 (three) times daily. 270 capsule 2   glipiZIDE (GLUCOTROL) 10 MG tablet Take 1 tablet (10 mg total) by mouth 2 (two) times daily. 180 tablet 1   glucose blood test strip Use to check blood sugar three times daily. 100 each 6   Insulin Glargine (BASAGLAR KWIKPEN) 100 UNIT/ML Inject  38 Units into the skin daily. (Patient taking differently: Inject 39-40 Units into the skin at bedtime.) 18 mL 6   Lancets MISC use as directed 100 each 0   latanoprost (XALATAN) 0.005 % ophthalmic solution Place 1 drop into both eyes every evening. (Patient not taking: Reported on 07/24/2023) 5 mL 11   linaclotide (LINZESS) 145 MCG CAPS capsule Take 145 mcg by mouth daily before breakfast.     linaclotide (LINZESS) 290 MCG CAPS capsule Take 1 capsule (290 mcg total) by mouth daily before breakfast. 90 capsule 1   loratadine (CLARITIN) 10 MG tablet Take 1 tablet (10 mg total) by mouth daily as needed for allergies.     metFORMIN (GLUCOPHAGE) 500 MG tablet Take 2 tablets (1,000 mg total) by mouth 2 (two) times daily with a meal. 360 tablet 1   metoCLOPramide (REGLAN) 10 MG tablet Take 1 tablet at 5am the morning of surgery and then every 6 to 8 hours as needed for nausea. (Patient taking differently: Take 10 mg by mouth daily.) 30 tablet 3   metoprolol succinate (TOPROL XL) 25 MG 24 hr tablet Take 1 tablet (25 mg total) by mouth daily. 90 tablet 1   ondansetron (ZOFRAN-ODT) 8 MG disintegrating tablet Take one tablet (8 mg dose) by mouth every 8 (eight) hours as needed for Nausea for up to 20 days. (Patient not taking: Reported on 07/24/2023) 30 tablet 1   pantoprazole (PROTONIX) 40 MG tablet Take 1 tablet (40 mg total) by mouth daily before breakfast. (Stop taking omeprazole). 90 tablet 1   Semaglutide, 2 MG/DOSE, 8 MG/3ML SOPN Inject 2 mg as directed once a week. 3 mL 6   tiZANidine (ZANAFLEX) 4 MG tablet Take 1 tablet (4 mg total) by mouth every 8 (eight) hours as needed for muscle spasms. 60 tablet 1   traMADol (ULTRAM) 50 MG tablet Take 1 tablet (50 mg total) by mouth every 6 (six) hours as needed. 10 tablet 0   No current facility-administered medications on file prior to visit.    Allergies as of 08/17/2023   (No Known Allergies)     ROS:  General:  No weight loss, Fever,  chills  HEENT: No recent headaches, no nasal bleeding, no visual changes, no sore throat  Neurologic: No dizziness, blackouts, seizures. No recent symptoms of stroke or mini- stroke. No recent episodes of slurred speech, or temporary blindness.  Cardiac: No recent episodes of chest pain/pressure, no shortness of breath at rest.  No shortness of breath with exertion.  Denies history of atrial fibrillation or irregular heartbeat  Vascular: No history of rest pain in feet.  No history of claudication.  No history of non-healing ulcer, No history of DVT   Pulmonary: No home oxygen, no productive cough, no hemoptysis,  No asthma or wheezing  Musculoskeletal:  [ ]  Arthritis, [ ]  Low back pain,  [ ]  Joint pain  Hematologic:No history of hypercoagulable state.  No history of easy bleeding.  No history of anemia  Gastrointestinal: No hematochezia or melena,  No gastroesophageal reflux, no trouble swallowing  Urinary: [ ]  chronic Kidney disease, [ ]  on HD - [ ]  MWF or [ ]  TTHS, [ ]  Burning with urination, [ ]  Frequent urination, [ ]  Difficulty urinating;   Skin: No rashes  Psychological: No history of anxiety,  No history of depression  Physical Examination  Vitals:   08/17/23 1111  BP: 90/60  Pulse: 94  Temp: 97.7 F (36.5 C)  SpO2: 97%  Weight: 225 lb 11.2 oz (102.4 kg)  Height: 5\' 4"  (1.626 m)    Body mass index is 38.74 kg/m.  General:  Alert and oriented, no acute distress HEENT: Normal Neck: No bruit or JVD Pulmonary: Clear to auscultation bilaterally Cardiac: Regular Rate and Rhythm without murmur Abdomen: Soft, non-tender, non-distended, no mass, no scars Skin: No rash       Extremity Pulses:   radial, femoral, dorsalis pedis, pulses bilaterally Musculoskeletal: No deformity or edema  Neurologic: Upper and lower extremity motor 5/5 and symmetric  DATA: Venous Reflux Times  +------------------+---------+------+-----------+------------+--------+  RIGHT             Reflux NoRefluxReflux TimeDiameter cmsComments                              Yes                                   +------------------+---------+------+-----------+------------+--------+  CFV                        yes   >1 second                       +------------------+---------+------+-----------+------------+--------+  FV mid            no                                              +------------------+---------+------+-----------+------------+--------+  Popliteal                  yes   >1 second                       +------------------+---------+------+-----------+------------+--------+  GSV at SFJ                  yes    >  500 ms      0.91              +------------------+---------+------+-----------+------------+--------+  GSV prox thigh    no                            0.52              +------------------+---------+------+-----------+------------+--------+  GSV mid thigh     no                            0.39              +------------------+---------+------+-----------+------------+--------+  GSV dist thigh    no                            0.34              +------------------+---------+------+-----------+------------+--------+  GSV at knee       no                            0.43              +------------------+---------+------+-----------+------------+--------+  SSV Pop Fossa     no                            0.21              +------------------+---------+------+-----------+------------+--------+  anterior accessoryno                            0.43              +------------------+---------+------+-----------+------------+--------+    Summary:  Right:  - No evidence of deep vein thrombosis from the common femoral through the  popliteal veins.  - No evidence of superficial venous thrombosis.  - The deep venous system is not competent.  - The great and small saphenous veins are  competent.      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  There is evidence of reflux on the duplex on the deep system without GSV reflux.  She does not qualify for laser ablation therapy since the is no evidence of GSV reflux.  She will be treated conservatively with exercise, compression and elevation.  She has plans to have a gastric sleeve to assist with weight loss. She has palpable pedal pulses and is not at risk of limb loss.  She does have areas of small spider/varicose veins without skin changes and no apparent edema.  She has cosmetic spider and small varicose veins.  I told her she is welcome to speak to our sclero therapy RN about treatment, but this would not be covered under insurance.      Mosetta Pigeon PA-C [facility] [place] Office: [phone] Pager: [phone]

## 2023-08-21 ENCOUNTER — Other Ambulatory Visit: Payer: Self-pay

## 2023-08-21 NOTE — Progress Notes (Unsigned)
Cardiology Office Note:  .   Date:  08/22/2023  ID:  Julie Robertson, DOB 04/30/1976, MRN [id number] PCP: Hoy Register, MD  [facility] Providers Cardiologist:  Tessa Lerner, DO   History of Present Illness: .   Julie Robertson is a 48 y.o.  female patient with h/o left occipital lobe stroke in June 2023, prior infarct right corona radiata (per imaging), confirmed PFO by TCD and also TEE in June 2023, insulin dependent type 2 diabetes, dyslipidemia, GERD, .former smoker (38 pack year history), sleep apnea on device therapy, obesity, CAD with current coronary angioplasty to LCx in February 2024 presents for f/u of PFO repair on 08/14/2023.  Discussed the use of AI scribe software for clinical note transcription with the patient, who gave verbal consent to proceed.  History of Present Illness   The patient, with a history of CAD and hypercholesterolemia, presents for a follow-up visit after a recent stent placement. She reports no concerns related to the stent placement site, which appears to be healing well. She notes that the bandage over the site was yellowish when removed, but the wound was dry. She has been scheduled for a liver biopsy due to an unidentified liver condition. She is also considering weight loss surgery, but is unsure about proceeding with it. She has been losing weight on her own, with a recent loss of three pounds.     Labs   Lab Results  Component Value Date   CHOL 120 03/29/2023   HDL 33 (L) 03/29/2023   LDLCALC 64 03/29/2023   LDLDIRECT 64 07/21/2022   TRIG 127 03/29/2023   CHOLHDL 6.0 12/21/2021   Lab Results  Component Value Date   NA 134 08/10/2023   K 4.4 08/10/2023   CO2 21 08/10/2023   GLUCOSE 174 (H) 08/10/2023   BUN 13 08/10/2023   CREATININE 0.77 08/10/2023   CALCIUM 9.3 08/10/2023   EGFR 95 08/10/2023   GFRNONAA >60 08/10/2022      Latest Ref Rng & Units 08/10/2023    4:45 PM 07/19/2023   11:55 AM  03/29/2023   11:34 AM  BMP  Glucose 70 - 99 mg/dL 409  811  914   BUN 6 - 24 mg/dL 13  16  18    Creatinine 0.57 - 1.00 mg/dL 7.82  9.56  2.13   BUN/Creat Ratio 9 - 23 17  18  19    Sodium 134 - 144 mmol/L 134  137  139   Potassium 3.5 - 5.2 mmol/L 4.4  4.3  4.4   Chloride 96 - 106 mmol/L 103  101  104   CO2 20 - 29 mmol/L 21  17  22    Calcium 8.7 - 10.2 mg/dL 9.3  9.5  9.6       Latest Ref Rng & Units 08/10/2023    4:45 PM 07/19/2023   11:55 AM 03/29/2023   11:34 AM  CBC  WBC 3.4 - 10.8 x10E3/uL 5.9  5.4  8.0   Hemoglobin 11.1 - 15.9 g/dL 08.6  57.8  46.9   Hematocrit 34.0 - 46.6 % 36.9  41.3  40.1   Platelets 150 - 450 x10E3/uL 346  325  382    Lab Results  Component Value Date   HGBA1C 6.4 03/21/2023    Lab Results  Component Value Date   TSH 3.090 03/22/2018    Review of Systems  Cardiovascular:  Negative for chest pain, dyspnea on exertion and leg swelling.   Physical Exam:  VS:  BP 108/78 (BP Location: Left Arm, Patient Position: Sitting, Cuff Size: Large)   Pulse 76   Resp 16   Ht 5\' 4"  (1.626 m)   Wt 224 lb 12.8 oz (102 kg)   SpO2 98%   BMI 38.59 kg/m    Wt Readings from Last 3 Encounters:  08/22/23 224 lb 12.8 oz (102 kg)  08/17/23 225 lb 11.2 oz (102.4 kg)  08/14/23 236 lb 1.8 oz (107.1 kg)  Physical Exam Constitutional:      Appearance: She is morbidly obese.  Neck:     Vascular: No carotid bruit or JVD.  Cardiovascular:     Rate and Rhythm: Normal rate and regular rhythm.     Pulses: Intact distal pulses.     Heart sounds: Normal heart sounds. No murmur heard.    No gallop.     Comments: Right groin site has healed well without any hematoma or bruit. Pulmonary:     Effort: Pulmonary effort is normal.     Breath sounds: Normal breath sounds.  Abdominal:     General: Bowel sounds are normal.     Palpations: Abdomen is soft.  Musculoskeletal:     Right lower leg: No edema.     Left lower leg: No edema.    Studies Reviewed: .    PFO closure  08/14/2023: Successful closure of the PFO with implantation of a 30 mm Cardioform Septal Occluder.  No complications. Home tomorrow due to late procedure.   ECHOCARDIOGRAM LIMITED 08/15/2023  1. S/p PFO closure. No evidence of residual leak. 2. Left ventricular ejection fraction, by estimation, is 60 to 65%. The left ventricle has normal function. 3. Right ventricular systolic function is normal. The right ventricular size is normal.  EKG:    EKG Interpretation Date/Time:  Tuesday August 22 2023 08:38:32 EST Ventricular Rate:  82 PR Interval:  164 QRS Duration:  82 QT Interval:  376 QTC Calculation: 439 R Axis:   -11  Text Interpretation: EKG 08/22/2023: Normal sinus rhythm at rate of 82 bpm, normal EKG.  No significant change from 07/19/2023. Confirmed by Delrae Rend [phone]) on 08/22/2023 8:46:56 AM    Medications and allergies    No Known Allergies   Current Outpatient Medications:    aspirin 81 MG chewable tablet, Chew 1 tablet (81 mg total) by mouth daily., Disp: 90 tablet, Rfl: 1   atorvastatin (LIPITOR) 40 MG tablet, Take 1 tablet (40 mg total) by mouth at bedtime., Disp: 90 tablet, Rfl: 3   blood glucose meter kit and supplies KIT, Use up to four times daily as directed., Disp: 1 each, Rfl: 11   Blood Glucose Monitoring Suppl (TRUE METRIX METER) w/Device KIT, Use as directed, Disp: 1 kit, Rfl: 0   clopidogrel (PLAVIX) 75 MG tablet, Take 1 tablet (75 mg total) by mouth daily., Disp: 90 tablet, Rfl: 3   Continuous Glucose Sensor (FREESTYLE LIBRE 3 SENSOR) MISC, USE AS DIRECTED, Disp: 2 each, Rfl: 3   empagliflozin (JARDIANCE) 25 MG TABS tablet, Take 1 tablet (25 mg total) by mouth daily before breakfast., Disp: 90 tablet, Rfl: 1   fenofibrate (TRICOR) 145 MG tablet, Take 1 tablet (145 mg total) by mouth daily., Disp: 90 tablet, Rfl: 2   gabapentin (NEURONTIN) 300 MG capsule, Take 1 capsule (300 mg total) by mouth 3 (three) times daily., Disp: 270 capsule, Rfl: 2    glipiZIDE (GLUCOTROL) 10 MG tablet, Take 1 tablet (10 mg total) by mouth 2 (two) times daily., Disp:  180 tablet, Rfl: 1   glucose blood test strip, Use to check blood sugar three times daily., Disp: 100 each, Rfl: 6   Insulin Glargine (BASAGLAR KWIKPEN) 100 UNIT/ML, Inject 38 Units into the skin daily. (Patient taking differently: Inject 39-40 Units into the skin at bedtime.), Disp: 18 mL, Rfl: 6   Lancets MISC, use as directed, Disp: 100 each, Rfl: 0   latanoprost (XALATAN) 0.005 % ophthalmic solution, Place 1 drop into both eyes every evening., Disp: 5 mL, Rfl: 11   linaclotide (LINZESS) 145 MCG CAPS capsule, Take 145 mcg by mouth daily before breakfast., Disp: , Rfl:    linaclotide (LINZESS) 290 MCG CAPS capsule, Take 1 capsule (290 mcg total) by mouth daily before breakfast., Disp: 90 capsule, Rfl: 1   loratadine (CLARITIN) 10 MG tablet, Take 1 tablet (10 mg total) by mouth daily as needed for allergies., Disp: , Rfl:    metFORMIN (GLUCOPHAGE) 500 MG tablet, Take 2 tablets (1,000 mg total) by mouth 2 (two) times daily with a meal., Disp: 360 tablet, Rfl: 1   metoCLOPramide (REGLAN) 10 MG tablet, Take 1 tablet at 5am the morning of surgery and then every 6 to 8 hours as needed for nausea. (Patient taking differently: Take 10 mg by mouth daily.), Disp: 30 tablet, Rfl: 3   metoprolol succinate (TOPROL XL) 25 MG 24 hr tablet, Take 1 tablet (25 mg total) by mouth daily., Disp: 90 tablet, Rfl: 1   pantoprazole (PROTONIX) 40 MG tablet, Take 1 tablet (40 mg total) by mouth daily before breakfast. (Stop taking omeprazole)., Disp: 90 tablet, Rfl: 1   prednisoLONE acetate (PRED FORTE) 1 % ophthalmic suspension, Place 2 drops into both eyes every 4 (four) hours., Disp: , Rfl:    Semaglutide, 2 MG/DOSE, 8 MG/3ML SOPN, Inject 2 mg as directed once a week., Disp: 3 mL, Rfl: 6   tiZANidine (ZANAFLEX) 4 MG tablet, Take 1 tablet (4 mg total) by mouth every 8 (eight) hours as needed for muscle spasms., Disp: 60  tablet, Rfl: 1   traMADol (ULTRAM) 50 MG tablet, Take 1 tablet (50 mg total) by mouth every 6 (six) hours as needed., Disp: 10 tablet, Rfl: 0   ASSESSMENT AND PLAN: .      ICD-10-CM   1. PFO (patent foramen ovale)  Q21.12 EKG 12-Lead    2. H/O congenital atrial septal defect (ASD) repair 08/14/2023: 30 mm Cardioform Septal Occluder.  Z87.74     3. Pre-operative cardiovascular examination  Z01.810      Other orders - prednisoLONE acetate (PRED FORTE) 1 % ophthalmic suspension; Place 2 drops into both eyes every 4 (four) hours.   Assessment and Plan    Coronary Artery Disease (CAD) CAD is managed with a stent, Plavix, and aspirin. Recent EKG and echocardiogram are normal. For upcoming surgery, stop Plavix for a total of 1 month of DAPT with aspirin and Plavix, stop at least 5 days prior to liver biopsy.  She will continue with aspirin indefinitely.  If aspirin needs to be held prior to liver biopsy, it should be low risk as well.  Follow up with Dr. Odis Hollingshead in six months.  Post-angioplasty/PFO repair follow-up The groin site from recent procedure is healed without complications. No further intervention is required.  Liver lesion A liver biopsy is scheduled to investigate a lesion, postponed for a month. Stop Plavix one month before the biopsy, continuing aspirin. Proceed with the biopsy one month after her PFO repair, stopping Plavix 5 to 6 days  before and continue aspirin perioperatively if allowed otherwise to stop aspirin 1 week prior.  Restart aspirin post-biopsy.  Weight loss There is a three-pound weight loss since the last visit. Discussed delaying surgery to focus on weight loss through lifestyle changes over the next six months. Monitor weight loss progress and consider delaying surgery for six months.  General Health Maintenance Maintain a healthy lifestyle and continue current medications. Encourage regular physical activity and continue medications as  prescribed.  Follow-up Follow up with Dr. Odis Hollingshead in six months.         Signed,  Yates Decamp, MD, [facility] 08/22/2023, 9:02 AM [facility] [address] [place], [place] [address] Phone: [phone]. Fax:  [phone]

## 2023-08-22 ENCOUNTER — Encounter: Payer: Self-pay | Admitting: Cardiology

## 2023-08-22 ENCOUNTER — Ambulatory Visit: Payer: Medicaid Other | Attending: Cardiology | Admitting: Cardiology

## 2023-08-22 VITALS — BP 108/78 | HR 76 | Resp 16 | Ht 64.0 in | Wt 224.8 lb

## 2023-08-22 DIAGNOSIS — Q2112 Patent foramen ovale: Secondary | ICD-10-CM | POA: Diagnosis not present

## 2023-08-22 DIAGNOSIS — Z01810 Encounter for preprocedural cardiovascular examination: Secondary | ICD-10-CM

## 2023-08-22 DIAGNOSIS — Z8774 Personal history of (corrected) congenital malformations of heart and circulatory system: Secondary | ICD-10-CM

## 2023-08-22 NOTE — Patient Instructions (Addendum)
Medication Instructions:  Your physician recommends that you continue on your current medications as directed. Please refer to the Current Medication list given to you today. Stop Clopidogrel on March 19  *If you need a refill on your cardiac medications before your next appointment, please call your pharmacy*   Lab Work: none If you have labs (blood work) drawn today and your tests are completely normal, you will receive your results only by: MyChart Message (if you have MyChart) OR A paper copy in the mail If you have any lab test that is abnormal or we need to change your treatment, we will call you to review the results.   Testing/Procedures: none   Follow-Up: At [facility], you and your health needs are our priority.  As part of our continuing mission to provide you with exceptional heart care, we have created designated Provider Care Teams.  These Care Teams include your primary Cardiologist (physician) and Advanced Practice Providers (APPs -  Physician Assistants and Nurse Practitioners) who all work together to provide you with the care you need, when you need it.  We recommend signing up for the patient portal called "MyChart".  Sign up information is provided on this After Visit Summary.  MyChart is used to connect with patients for Virtual Visits (Telemedicine).  Patients are able to view lab/test results, encounter notes, upcoming appointments, etc.  Non-urgent messages can be sent to your provider as well.   To learn more about what you can do with MyChart, go to [email or web address].    Your next appointment:   6 month(s)  Provider:   Tessa Lerner, DO     Other Instructions

## 2023-08-30 ENCOUNTER — Other Ambulatory Visit: Payer: Self-pay

## 2023-08-31 ENCOUNTER — Other Ambulatory Visit: Payer: Self-pay

## 2023-09-06 ENCOUNTER — Other Ambulatory Visit: Payer: Self-pay

## 2023-09-07 ENCOUNTER — Other Ambulatory Visit: Payer: Self-pay

## 2023-09-11 ENCOUNTER — Other Ambulatory Visit: Payer: Self-pay

## 2023-09-12 ENCOUNTER — Other Ambulatory Visit: Payer: Self-pay

## 2023-09-13 ENCOUNTER — Other Ambulatory Visit: Payer: Self-pay

## 2023-09-20 ENCOUNTER — Other Ambulatory Visit: Payer: Self-pay

## 2023-09-25 ENCOUNTER — Other Ambulatory Visit: Payer: Self-pay | Admitting: Family Medicine

## 2023-09-25 DIAGNOSIS — K5909 Other constipation: Secondary | ICD-10-CM

## 2023-09-26 ENCOUNTER — Other Ambulatory Visit: Payer: Self-pay

## 2023-09-26 ENCOUNTER — Ambulatory Visit: Payer: Medicaid Other | Admitting: Family Medicine

## 2023-09-26 MED ORDER — LINACLOTIDE 290 MCG PO CAPS
290.0000 ug | ORAL_CAPSULE | Freq: Every day | ORAL | 1 refills | Status: DC
  Filled 2023-09-26 – 2023-10-12 (×2): qty 90, 90d supply, fill #0

## 2023-09-26 NOTE — Telephone Encounter (Signed)
 Requested Prescriptions  Pending Prescriptions Disp Refills   linaclotide (LINZESS) 290 MCG CAPS capsule 90 capsule 1    Sig: Take 1 capsule (290 mcg total) by mouth daily before breakfast.     Gastroenterology: Irritable Bowel Syndrome Passed - 09/26/2023 11:11 AM      Passed - Valid encounter within last 12 months    Recent Outpatient Visits           6 months ago Type 2 diabetes mellitus with other specified complication, with long-term current use of insulin (HCC)   [facility] Dept Of [place]. [facility] Hoy Register, MD   6 months ago Type 2 diabetes mellitus with other specified complication, with long-term current use of insulin (HCC)   [facility] - [facility] Dept Of [place]. [facility] Lois Huxley, [place] L, RPH-CPP   9 months ago Type 2 diabetes mellitus with other specified complication, with long-term current use of insulin (HCC)   [facility] Of [place]. [facility] Lois Huxley, [place] L, RPH-CPP   10 months ago Type 2 diabetes mellitus with other specified complication, with long-term current use of insulin (HCC)   [facility] - [facility] Dept Of [place]. [facility] Lois Huxley, [place] L, RPH-CPP   11 months ago Type 2 diabetes mellitus with other specified complication, with long-term current use of insulin (HCC)   [facility] - A Dept Of [place]. [facility] Hoy Register, MD       Future Appointments             In 1 month Hoy Register, MD [facility] [place] - A Dept Of . [facility]

## 2023-10-03 ENCOUNTER — Other Ambulatory Visit: Payer: Self-pay

## 2023-10-03 ENCOUNTER — Other Ambulatory Visit: Payer: Self-pay | Admitting: Cardiology

## 2023-10-03 MED ORDER — ATORVASTATIN CALCIUM 40 MG PO TABS
40.0000 mg | ORAL_TABLET | Freq: Every day | ORAL | 3 refills | Status: AC
  Filled 2023-10-12: qty 90, 90d supply, fill #0
  Filled 2024-01-03: qty 90, 90d supply, fill #1
  Filled 2024-04-03: qty 90, 90d supply, fill #2
  Filled 2024-07-16: qty 90, 90d supply, fill #3

## 2023-10-05 ENCOUNTER — Other Ambulatory Visit: Payer: Self-pay

## 2023-10-12 ENCOUNTER — Other Ambulatory Visit: Payer: Self-pay

## 2023-10-12 MED ORDER — PREDNISONE 10 MG PO TABS
50.0000 mg | ORAL_TABLET | Freq: Every day | ORAL | 0 refills | Status: DC
Start: 2023-10-12 — End: 2023-11-03
  Filled 2023-10-12: qty 50, 10d supply, fill #0

## 2023-10-13 ENCOUNTER — Other Ambulatory Visit: Payer: Self-pay | Admitting: Family Medicine

## 2023-10-13 ENCOUNTER — Other Ambulatory Visit: Payer: Self-pay

## 2023-10-13 MED ORDER — ACCU-CHEK SOFTCLIX LANCETS MISC
6 refills | Status: DC
  Filled 2023-10-13: qty 100, 33d supply, fill #0
  Filled 2023-11-16: qty 100, 33d supply, fill #1
  Filled 2023-12-21: qty 100, 33d supply, fill #2
  Filled 2024-01-16: qty 100, 33d supply, fill #3
  Filled 2024-02-20: qty 100, 33d supply, fill #4
  Filled 2024-04-03: qty 100, 33d supply, fill #5
  Filled 2024-05-07: qty 100, 33d supply, fill #6

## 2023-10-13 MED ORDER — ACCU-CHEK GUIDE TEST VI STRP
ORAL_STRIP | 6 refills | Status: DC
  Filled 2023-10-13: qty 100, 33d supply, fill #0
  Filled 2023-11-16: qty 100, 33d supply, fill #1
  Filled 2023-12-21: qty 100, 33d supply, fill #2
  Filled 2024-01-16: qty 100, 33d supply, fill #3
  Filled 2024-02-20: qty 100, 33d supply, fill #4
  Filled 2024-04-03: qty 100, 33d supply, fill #5
  Filled 2024-05-07: qty 100, 33d supply, fill #6

## 2023-10-13 MED ORDER — ACCU-CHEK GUIDE W/DEVICE KIT
PACK | 0 refills | Status: DC
  Filled 2023-10-13: qty 1, 30d supply, fill #0

## 2023-10-16 ENCOUNTER — Telehealth: Payer: Self-pay

## 2023-10-16 ENCOUNTER — Other Ambulatory Visit: Payer: Self-pay

## 2023-10-16 NOTE — Telephone Encounter (Signed)
 Copied from CRM [id number]. Topic: General - Other >> Oct 16, 2023 11:16 AM Lorrane Rosette wrote: Reason for CRM: Dr. Almeda Jacobs requests that Dr. Adan Holms return his call at [phone]

## 2023-10-17 ENCOUNTER — Other Ambulatory Visit: Payer: Self-pay

## 2023-10-17 NOTE — Telephone Encounter (Signed)
 I gave him a call but was unable to reach him.  Can you please take a message?  Thank you.

## 2023-10-17 NOTE — Telephone Encounter (Signed)
 Call to patient unable to reach. Please if patient returns call. Take a message about what the call is concerning and we will return as soon as possible. Thank you.

## 2023-10-23 ENCOUNTER — Other Ambulatory Visit: Payer: Self-pay | Admitting: Family Medicine

## 2023-10-23 ENCOUNTER — Other Ambulatory Visit: Payer: Self-pay

## 2023-10-23 DIAGNOSIS — E1169 Type 2 diabetes mellitus with other specified complication: Secondary | ICD-10-CM

## 2023-10-23 MED ORDER — FENOFIBRATE 145 MG PO TABS
145.0000 mg | ORAL_TABLET | Freq: Every day | ORAL | 0 refills | Status: DC
  Filled 2023-10-26: qty 30, 30d supply, fill #0

## 2023-10-23 MED ORDER — GLIPIZIDE 10 MG PO TABS
10.0000 mg | ORAL_TABLET | Freq: Two times a day (BID) | ORAL | 0 refills | Status: DC
  Filled 2023-10-26: qty 60, 30d supply, fill #0

## 2023-10-24 ENCOUNTER — Other Ambulatory Visit: Payer: Self-pay

## 2023-10-24 MED ORDER — PREDNISONE 50 MG PO TABS
50.0000 mg | ORAL_TABLET | Freq: Every day | ORAL | 0 refills | Status: DC
  Filled 2023-10-24: qty 7, 7d supply, fill #0

## 2023-10-24 MED ORDER — GABAPENTIN 300 MG PO CAPS
300.0000 mg | ORAL_CAPSULE | Freq: Three times a day (TID) | ORAL | 2 refills | Status: DC
  Filled 2023-10-26: qty 270, 90d supply, fill #0
  Filled 2024-01-22: qty 270, 90d supply, fill #1
  Filled 2024-05-07: qty 270, 90d supply, fill #2

## 2023-10-26 ENCOUNTER — Other Ambulatory Visit: Payer: Self-pay

## 2023-10-26 MED ORDER — AZATHIOPRINE 50 MG PO TABS
50.0000 mg | ORAL_TABLET | Freq: Every day | ORAL | 0 refills | Status: DC
  Filled 2023-10-26: qty 90, 90d supply, fill #0

## 2023-10-26 MED ORDER — PREDNISONE 20 MG PO TABS
40.0000 mg | ORAL_TABLET | Freq: Every day | ORAL | 0 refills | Status: DC
  Filled 2023-10-26: qty 20, 10d supply, fill #0

## 2023-11-03 ENCOUNTER — Other Ambulatory Visit: Payer: Self-pay

## 2023-11-03 MED ORDER — PREDNISONE 10 MG PO TABS
30.0000 mg | ORAL_TABLET | Freq: Every day | ORAL | 0 refills | Status: DC
  Filled 2023-11-03: qty 21, 7d supply, fill #0

## 2023-11-06 ENCOUNTER — Telehealth: Payer: Self-pay | Admitting: Family Medicine

## 2023-11-06 ENCOUNTER — Other Ambulatory Visit: Payer: Self-pay

## 2023-11-06 ENCOUNTER — Other Ambulatory Visit: Payer: Self-pay | Admitting: Family Medicine

## 2023-11-06 MED ORDER — EMPAGLIFLOZIN 25 MG PO TABS
25.0000 mg | ORAL_TABLET | Freq: Every day | ORAL | 0 refills | Status: DC
  Filled 2023-11-06: qty 30, 30d supply, fill #0

## 2023-11-06 NOTE — Telephone Encounter (Signed)
 Called & spoke to the patient's daughter Julie Robertson, Verified name & DOB. Patient would like to keep her appointment for Nov 07, 2023 at 9:50 am to see Dr. Newlin.    Copied from CRM [id number]. Topic: Appointments - Scheduling Inquiry for Clinic >> Nov 06, 2023  8:37 AM Adonis Hoot wrote:  Reason for CRM: Patient is having scheduling conflict,she's scheduled tomorrow 5/6/205,but she has a appointment with  the  liver doctor as well. She would like to know if she could be scheduled with her  doctor sooner than her next available date of June 10? Please call patient.

## 2023-11-07 ENCOUNTER — Other Ambulatory Visit: Payer: Self-pay

## 2023-11-07 ENCOUNTER — Encounter: Payer: Self-pay | Admitting: Family Medicine

## 2023-11-07 ENCOUNTER — Ambulatory Visit: Attending: Family Medicine | Admitting: Family Medicine

## 2023-11-07 ENCOUNTER — Telehealth: Payer: Self-pay | Admitting: Family Medicine

## 2023-11-07 VITALS — BP 128/84 | HR 82 | Ht 64.0 in | Wt 235.4 lb

## 2023-11-07 DIAGNOSIS — Z794 Long term (current) use of insulin: Secondary | ICD-10-CM | POA: Diagnosis not present

## 2023-11-07 DIAGNOSIS — K5909 Other constipation: Secondary | ICD-10-CM

## 2023-11-07 DIAGNOSIS — I10 Essential (primary) hypertension: Secondary | ICD-10-CM

## 2023-11-07 DIAGNOSIS — Z7984 Long term (current) use of oral hypoglycemic drugs: Secondary | ICD-10-CM | POA: Diagnosis not present

## 2023-11-07 DIAGNOSIS — E1169 Type 2 diabetes mellitus with other specified complication: Secondary | ICD-10-CM

## 2023-11-07 DIAGNOSIS — E1159 Type 2 diabetes mellitus with other circulatory complications: Secondary | ICD-10-CM

## 2023-11-07 DIAGNOSIS — I2510 Atherosclerotic heart disease of native coronary artery without angina pectoris: Secondary | ICD-10-CM

## 2023-11-07 DIAGNOSIS — K219 Gastro-esophageal reflux disease without esophagitis: Secondary | ICD-10-CM

## 2023-11-07 DIAGNOSIS — E1165 Type 2 diabetes mellitus with hyperglycemia: Secondary | ICD-10-CM | POA: Diagnosis not present

## 2023-11-07 DIAGNOSIS — R399 Unspecified symptoms and signs involving the genitourinary system: Secondary | ICD-10-CM | POA: Diagnosis not present

## 2023-11-07 DIAGNOSIS — Z7985 Long-term (current) use of injectable non-insulin antidiabetic drugs: Secondary | ICD-10-CM

## 2023-11-07 DIAGNOSIS — K754 Autoimmune hepatitis: Secondary | ICD-10-CM

## 2023-11-07 DIAGNOSIS — E785 Hyperlipidemia, unspecified: Secondary | ICD-10-CM

## 2023-11-07 DIAGNOSIS — Z23 Encounter for immunization: Secondary | ICD-10-CM

## 2023-11-07 LAB — POCT URINALYSIS DIP (CLINITEK)
Bilirubin, UA: NEGATIVE
Glucose, UA: >=1000 mg/dL — AB
Ketones, POC UA: NEGATIVE mg/dL
Leukocytes, UA: NEGATIVE
Nitrite, UA: POSITIVE — AB
Spec Grav, UA: 1.025 (ref 1.010–1.025)
Urobilinogen, UA: 0.2 U/dL
pH, UA: 5.5 (ref 5.0–8.0)

## 2023-11-07 LAB — POCT GLYCOSYLATED HEMOGLOBIN (HGB A1C): HbA1c, POC (controlled diabetic range): 8.3 % — AB (ref 0.0–7.0)

## 2023-11-07 MED ORDER — PREDNISONE 10 MG PO TABS
10.0000 mg | ORAL_TABLET | Freq: Every day | ORAL | 0 refills | Status: DC
  Filled 2023-11-07 – 2023-11-16 (×2): qty 30, 30d supply, fill #0

## 2023-11-07 MED ORDER — LINACLOTIDE 290 MCG PO CAPS
290.0000 ug | ORAL_CAPSULE | Freq: Every day | ORAL | 1 refills | Status: DC
  Filled 2023-11-07 – 2024-01-03 (×2): qty 90, 90d supply, fill #0
  Filled 2024-03-13 – 2024-04-03 (×2): qty 90, 90d supply, fill #1

## 2023-11-07 MED ORDER — INSULIN LISPRO (1 UNIT DIAL) 100 UNIT/ML (KWIKPEN)
0.0000 [IU] | PEN_INJECTOR | Freq: Three times a day (TID) | SUBCUTANEOUS | 11 refills | Status: DC
  Filled 2023-11-07: qty 30, 84d supply, fill #0

## 2023-11-07 MED ORDER — SEMAGLUTIDE (2 MG/DOSE) 8 MG/3ML ~~LOC~~ SOPN
2.0000 mg | PEN_INJECTOR | SUBCUTANEOUS | 6 refills | Status: DC
  Filled 2023-11-07 – 2023-11-22 (×2): qty 3, 28d supply, fill #0
  Filled 2023-12-21: qty 3, 28d supply, fill #1

## 2023-11-07 MED ORDER — FREESTYLE LIBRE 3 SENSOR MISC
11 refills | Status: AC
  Filled 2023-11-07: qty 2, 28d supply, fill #0
  Filled 2023-12-01 (×3): qty 2, 28d supply, fill #1
  Filled 2024-01-03: qty 2, 28d supply, fill #2
  Filled 2024-02-06: qty 2, 28d supply, fill #3
  Filled 2024-03-06: qty 2, 28d supply, fill #4
  Filled 2024-04-03: qty 2, 28d supply, fill #5
  Filled 2024-05-07: qty 2, 28d supply, fill #6
  Filled 2024-05-27 – 2024-06-07 (×4): qty 2, 28d supply, fill #7
  Filled 2024-07-02: qty 2, 28d supply, fill #8
  Filled 2024-07-16 – 2024-08-02 (×3): qty 2, 28d supply, fill #9

## 2023-11-07 MED ORDER — METFORMIN HCL 500 MG PO TABS
1000.0000 mg | ORAL_TABLET | Freq: Two times a day (BID) | ORAL | 1 refills | Status: DC
Start: 2023-11-07 — End: 2024-04-03
  Filled 2023-11-07 – 2024-01-03 (×2): qty 360, 90d supply, fill #0
  Filled 2024-04-03: qty 360, 90d supply, fill #1

## 2023-11-07 MED ORDER — EMPAGLIFLOZIN 25 MG PO TABS
25.0000 mg | ORAL_TABLET | Freq: Every day | ORAL | 1 refills | Status: DC
  Filled 2023-11-07: qty 90, 90d supply, fill #0
  Filled 2024-01-23 (×2): qty 90, 90d supply, fill #1

## 2023-11-07 MED ORDER — GLIPIZIDE 10 MG PO TABS
10.0000 mg | ORAL_TABLET | Freq: Two times a day (BID) | ORAL | 1 refills | Status: DC
Start: 2023-11-07 — End: 2024-04-03
  Filled 2023-11-07 – 2023-11-22 (×2): qty 180, 90d supply, fill #0
  Filled 2024-02-06 – 2024-02-08 (×2): qty 180, 90d supply, fill #1

## 2023-11-07 MED ORDER — PANTOPRAZOLE SODIUM 40 MG PO TBEC
40.0000 mg | DELAYED_RELEASE_TABLET | Freq: Every day | ORAL | 1 refills | Status: DC
  Filled 2023-11-07 – 2024-01-03 (×2): qty 90, 90d supply, fill #0
  Filled 2024-04-03: qty 90, 90d supply, fill #1

## 2023-11-07 MED ORDER — METOPROLOL SUCCINATE ER 25 MG PO TB24
25.0000 mg | ORAL_TABLET | Freq: Every day | ORAL | 1 refills | Status: DC
  Filled 2023-11-07: qty 90, 90d supply, fill #0
  Filled 2023-11-16 – 2024-02-06 (×2): qty 90, 90d supply, fill #1

## 2023-11-07 MED ORDER — BASAGLAR KWIKPEN 100 UNIT/ML ~~LOC~~ SOPN
60.0000 [IU] | PEN_INJECTOR | Freq: Every day | SUBCUTANEOUS | 6 refills | Status: DC
  Filled 2023-11-07: qty 30, 50d supply, fill #0

## 2023-11-07 NOTE — Telephone Encounter (Signed)
 Noted.   Copied from CRM [id number]. Topic: Appointments - Appointment Scheduling >> Nov 07, 2023  9:54 AM [place] B wrote: Pt says running late from another appt will be in by 10:08, I let her know hopefully she can still be seen. She is speeding to get there

## 2023-11-07 NOTE — Patient Instructions (Signed)
 Diabetes mellitus y nutricin, en adultos Diabetes Mellitus and Nutrition, Adult Si sufre de diabetes, o diabetes mellitus, es muy importante tener hbitos alimenticios saludables debido a que sus niveles de Psychologist, counselling sangre (glucosa) se ven afectados en gran medida por lo que come y bebe. [place] alimentos saludables en las cantidades correctas, aproximadamente a la misma hora todos los [place], [place] ayudar a: Chief Operating Officer su glucemia. Disminuir el riesgo de sufrir una enfermedad cardaca. Mejorar la presin arterial. Barista o mantener un peso saludable. Qu puede afectar mi plan de alimentacin? Todas las personas que sufren de diabetes son diferentes y cada una tiene necesidades diferentes en cuanto a un plan de alimentacin. El mdico puede recomendarle que trabaje con un nutricionista para elaborar el mejor plan para usted. Su plan de alimentacin puede variar segn factores como: Las caloras que necesita. Los medicamentos que toma. Su peso. Sus niveles de glucemia, presin arterial y colesterol. Su nivel de [place]. Otras afecciones que tenga, como enfermedades cardacas o renales. Cmo me afectan los carbohidratos? Los carbohidratos, o hidratos de carbono, afectan su nivel de glucemia ms que cualquier otro tipo de alimento. La ingesta de carbohidratos aumenta la cantidad de [employer]. Es importante conocer la cantidad de carbohidratos que se pueden ingerir en cada comida sin correr Surveyor, minerals. Esto es Government social research officer. Su nutricionista puede ayudarlo a calcular la cantidad de carbohidratos que debe ingerir en cada comida y en cada refrigerio. Cmo me afecta el alcohol? El alcohol puede provocar una disminucin de la glucemia (hipoglucemia), especialmente si [place] insulina o toma determinados medicamentos por va oral para la diabetes. La hipoglucemia es una afeccin potencialmente mortal. Los sntomas de la hipoglucemia, como somnolencia, mareos y confusin, son  similares a los sntomas de haber consumido demasiado alcohol. No beba alcohol si: Su mdico le indica no hacerlo. Est embarazada, puede estar embarazada o est tratando de [place]. Si bebe alcohol: Limite la cantidad que bebe a lo siguiente: De 0 a 1 medida por da para las mujeres. De 0 a 2 medidas por da para los hombres. Sepa cunta cantidad de alcohol hay en las bebidas que toma. En los [address], una medida equivale a una botella de cerveza de 12 oz (355 ml), un vaso de vino de 5 oz (148 ml) o un vaso de una bebida alcohlica de alta graduacin de 1 oz (44 ml). Mantngase hidratado bebiendo agua, refrescos dietticos o t helado sin azcar. Tenga en cuenta que los refrescos comunes, los jugos y otras bebidas para mezclar pueden contener Product/process development scientist y se deben contar como carbohidratos. Consejos para seguir Social worker las etiquetas de los alimentos Comience por leer el tamao de la porcin en la etiqueta de Informacin nutricional de los alimentos envasados y las bebidas. La cantidad de caloras, carbohidratos, grasas y otros nutrientes detallados en la etiqueta se basan en una porcin del alimento. Muchos alimentos contienen ms de una porcin por envase. Verifique la cantidad total de gramos (g) de carbohidratos totales en una porcin. Verifique la cantidad de gramos de grasas saturadas y grasas trans en una porcin. Escoja alimentos que no contengan estas grasas o que su contenido de estas sea [place]. Verifique la cantidad de miligramos (mg) de sal (sodio) en una porcin. La [employer]son de las personas deben limitar la ingesta de sodio total a menos de 2300 mg Google. Siempre consulte la informacin nutricional de los alimentos etiquetados como "con bajo contenido de grasa" o "sin grasa".  Estos alimentos pueden tener un mayor contenido de International aid/development worker agregada o carbohidratos refinados, y deben evitarse. Hable con su nutricionista para identificar sus objetivos diarios en  cuanto a los nutrientes mencionados en la etiqueta. Al ir de compras Evite comprar alimentos procesados, enlatados o precocidos. Estos alimentos tienden a Counselling psychologist mayor cantidad de [place], sodio y azcar agregada. Compre en la zona exterior de la tienda de comestibles. Esta es la zona donde se encuentran con mayor frecuencia las frutas y las verduras frescas, los cereales a granel, las carnes frescas y los productos lcteos frescos. Al cocinar Use mtodos de coccin a baja temperatura, como hornear, en lugar de mtodos de coccin a alta temperatura, como frer en abundante aceite. Cocine con aceites saludables, como el aceite de [place], canola o [place]. Evite cocinar con manteca, crema o carnes con alto contenido de grasa. Planificacin de las comidas Coma las comidas y los refrigerios regularmente, preferentemente a la misma hora todos [place]. Evite pasar largos perodos de tiempo sin comer. Consuma alimentos ricos en fibra, como frutas frescas, verduras, frijoles y cereales integrales. Consuma entre 4 y 6 onzas (entre 112 y 168 g) de protenas magras por da, como carnes [place], pollo, pescado, huevos o tofu. Una onza (oz) (28 g) de protena magra equivale a: 1 onza (28 g) de carne, pollo o pescado. 1 huevo.  taza (62 g) de tofu. Coma algunos alimentos por da que contengan grasas saludables, como aguacates, frutos secos, semillas y pescado. Qu alimentos debo comer? Nils Pyle Bayas. Manzanas. Naranjas. Duraznos. Damascos. Ciruelas. Uvas. Mangos. Papayas. Granadas. Kiwi. Cerezas. Verduras Verduras de Marriott, que incluyen [place], [place], col rizada, acelga, hojas de berza, hojas de mostaza y repollo. Remolachas. Coliflor. Brcoli. Zanahorias. Judas verdes. Tomates. Pimientos. Cebollas. Pepinos. Coles de Bruselas. Granos Granos integrales, como panes, galletas, tortillas, cereales y pastas de salvado o integrales. Avena sin azcar. Quinua. Arroz integral o salvaje. Carnes y otras  protenas Frutos de mar. Carne de ave sin piel. Cortes magros de ave y carne de res. Tofu. Frutos secos. Semillas. Lcteos Productos lcteos sin grasa o con bajo contenido de [place], [place], yogur y [place]. Es posible que los productos detallados arriba no constituyan una lista completa de los alimentos y las bebidas que puede tomar. Consulte a un nutricionista para obtener ms informacin. Qu alimentos debo evitar? Nils Pyle Frutas enlatadas al almbar. Verduras Verduras enlatadas. Verduras congeladas con mantequilla o salsa de crema. Granos Productos elaborados con [place] y [place], como panes, pastas, bocadillos y cereales. Evite todos los alimentos procesados. Carnes y [address] de carne con alto contenido de Holiday representative. Carne de ave con piel. Carnes empanizadas o fritas. Carne procesada. Evite las grasas saturadas. Lcteos Yogur, queso o Cardinal Health. Bebidas Bebidas azucaradas, como gaseosas o t helado. Es posible que los productos que se enumeran ms [place] no constituyan una lista completa de los alimentos y las bebidas que Personnel officer. Consulte a un nutricionista para obtener ms informacin. Preguntas para hacerle al mdico Debo consultar con un especialista certificado en atencin y educacin sobre la diabetes? Es necesario que me rena con un nutricionista? A qu nmero puedo llamar si tengo preguntas? Cules son los mejores momentos para controlar la glucemia? Dnde encontrar ms informacin: American Diabetes Association (Asociacin Estadounidense de la Diabetes): diabetes.org Academy of Nutrition and Dietetics (Academia de Nutricin y Pension scheme manager): eatright.Dana Corporation of Diabetes and Digestive and Kidney Diseases [employer]ere & Company de la Diabetes y las Enfermedades Digestivas y Renales): StageSync.si Association of Diabetes  Care & Education Specialists (Asociacin de Especialistas en Atencin y Francella Solian la Diabetes):  diabeteseducator.org Resumen Es importante tener hbitos alimenticios saludables debido a que sus niveles de Psychologist, counselling sangre (glucosa) se ven afectados en gran medida por lo que come y bebe. Es importante consumir alcohol con prudencia. Un plan de comidas saludable lo ayudar a controlar la glucosa en sangre y a reducir el riesgo de enfermedades cardacas. El mdico puede recomendarle que trabaje con un nutricionista para elaborar el mejor plan para usted. Esta informacin no tiene Theme park manager el consejo del mdico. Asegrese de hacerle al mdico cualquier pregunta que tenga. Document Revised: 02/26/2020 Document Reviewed: 02/26/2020 Elsevier Patient Education  2024 [employer].

## 2023-11-07 NOTE — Progress Notes (Signed)
 Subjective:  Patient ID: Julie Robertson, female    DOB: April 14, 1976  Age: 48 y.o. MRN: [id number]  CC: Medical Management of Chronic Issues     Discussed the use of AI scribe software for clinical note transcription with the patient, who gave verbal consent to proceed.  History of Present Illness Julie Robertson is a 48 year old female with a history of left occipital lobe stroke in 12/2021, PFO, type 2 diabetes mellitus, dyslipidemia, GERD, OSA (on CPAP) nicotine  dependence  and autoimmune hepatitis (diagnosed after a liver biopsy) who presents with elevated blood sugar levels and medication management concerns.  She is under the care of [facility] for management of her autoimmune hepatitis and is on high-dose steroids, which are being tapered. Her blood sugar levels have increased significantly since starting the steroids, reaching up to 600 mg/dL. She has been using her mother's insulin  due to running out of her own supply and has been taking 50 units of insulin , adjusting the dose herself. Her A1c has increased from 6.4% to 8.3%. She is also on Ozempic  once a week and was previously on meal time insulin , which she has run out of.  She has difficulty managing her blood sugar without a continuous glucose monitor, which she has not had for two months. She uses both a fingerstick monitor and a continuous glucose monitor when available.  She experiences symptoms suggestive of a urinary tract infection, including a burning sensation during urination and frequent urination, without fever or other systemic symptoms.  For her hyperlipidemia she has been adherent with her statin.  She is doing well on her antihypertensive as well.  Also doing well on her PPI. She was previously being evaluated for gastric sleeve surgery but this has been placed on hold due to new diagnosis of autoimmune hepatitis.   Past Medical History:  Diagnosis Date   Anxiety    Asthma     Coronary artery disease    CVA (cerebral vascular accident) (HCC)    Diabetes mellitus without complication (HCC)    Diabetic neuropathy (HCC) 12/2013   vs carpal tunnell.  numbness tingling in right fingers. rx with Gabapentin .   Dyslipidemia 08/2009   Dyspnea    Gall stones 08/2009   GERD (gastroesophageal reflux disease)    H/O tubal ligation    Obesity    BMI 41, 250# 03/2015    Past Surgical History:  Procedure Laterality Date   APPLICATION OF WOUND VAC N/A 05/09/2019   Procedure: APPLICATION OF WOUND VAC;  Surgeon: Emmalene Hare, MD;  Location: [facility] ORS;  Service: General;  Laterality: N/A;  [id number]   BUBBLE STUDY  12/24/2021   Procedure: BUBBLE STUDY;  Surgeon: Lenise Quince, MD;  Location: [facility] ENDOSCOPY;  Service: Cardiovascular;;   CHOLECYSTECTOMY N/A 03/10/2015   Procedure: LAPAROSCOPIC CHOLECYSTECTOMY WITH INTRAOPERATIVE CHOLANGIOGRAM;  Surgeon: Dorena Gander, MD;  Location: [facility] OR;  Service: General;  Laterality: N/A;   CORONARY BALLOON ANGIOPLASTY N/A 08/09/2022   Procedure: CORONARY BALLOON ANGIOPLASTY;  Surgeon: Knox Perl, MD;  Location: [facility] INVASIVE CV LAB;  Service: Cardiovascular;  Laterality: N/A;   ERCP N/A 03/11/2015   Procedure: ENDOSCOPIC RETROGRADE CHOLANGIOPANCREATOGRAPHY (ERCP);  Surgeon: Janel Medford, MD;  Location: [facility] ENDOSCOPY;  Service: Endoscopy;  Laterality: N/A;   ESOPHAGOGASTRODUODENOSCOPY (EGD) WITH PROPOFOL  N/A 07/25/2018   Procedure: ESOPHAGOGASTRODUODENOSCOPY (EGD) WITH PROPOFOL ;  Surgeon: Brice Campi Albino Alu., MD;  Location: [facility] ENDOSCOPY;  Service: Gastroenterology;  Laterality: N/A;   INSERTION OF MESH N/A 05/09/2019  Procedure: INSERTION OF MESH;  Surgeon: Emmalene Hare, MD;  Location: [facility] ORS;  Service: General;  Laterality: N/A;   LEFT HEART CATH AND CORONARY ANGIOGRAPHY N/A 08/09/2022   Procedure: LEFT HEART CATH AND CORONARY ANGIOGRAPHY;  Surgeon: Knox Perl, MD;  Location: [facility] INVAS[facility] CV LAB;  Service: Cardiovascular;  Laterality: N/A;    PATENT FORAMEN OVALE(PFO) CLOSURE N/A 07/27/2023   Procedure: PATENT FORAMEN OVALE(PFO) CLOSURE;  Surgeon: Knox Perl, MD;  [facility]: [facility] CV LAB;  Service: Cardiovascular;  Laterality: N/A;   PATENT FORAMEN OVALE(PFO) CLOSURE N/A 08/14/2023   Procedure: PATENT FORAMEN OVALE(PFO) CLOSURE;  Surgeon: Knox Perl, MD;  Location: [facility]VASIVE CV LAB;  Service: Cardiovascular;  Laterality: N/A;   TEE WITHOUT CARDIOVERSION N/A 12/24/2021   Procedure: TRANSESOPHAGEAL ECHOCARDIOGRAM (TEE);  Surgeon: Lenise Quince, MD;  Location: [facility] ENDOSCOPY;  Service: Cardiovascular;  Laterality: N/A;   Transduodenal Ampullectomy  09/2015   TUBAL LIGATION     VENTRAL HERNIA REPAIR N/A 05/09/2019   Procedure: HERNIA REPAIR VENTRAL ADULT with MESH;  Surgeon: Emmalene Hare, MD;  Location: [facility] ORS;  Service: General;  Laterality: N/A;    Family History  Adopted: Yes  Problem Relation Age of Onset   Diabetes Mother    Heart disease Mother    Hypertension Mother    Heart attack Father    Diabetes Father    Kidney disease Maternal Uncle    Bone cancer Maternal Grandfather    Other Son        had kidney removed due to gun shot wound    Social History   Socioeconomic History   Marital status: Married    Spouse name: Not on file   Number of children: 2   Years of education: Not on file   Highest education level: Not on file  Occupational History   Occupation: house cleaning  Tobacco Use   Smoking status: Former    Current packs/day: 1.50    Average packs/day: 1.5 packs/day for 4.0 years (6.0 ttl pk-yrs)    Types: Cigarettes    Passive exposure: Never   Smokeless tobacco: Never  Vaping Use   Vaping status: Never Used  Substance and Sexual Activity   Alcohol  use: Yes    Comment: rare   Drug use: No   Sexual activity: Not Currently  Other Topics Concern   Not on file  Social History Narrative   Patient has 2 sons one is 56, the other 63 as of 03/2015. Her kids are not the children of her  current husband. As of 03/2015 she is not employed outside the home.   Social Drivers of Corporate investment banker Strain: Low Risk  (03/22/2023)   Received from [facility]   Overall Financial Resource Strain (CARDIA)    Difficulty of Paying Living Expenses: Not hard at all  Food Insecurity: No Food Insecurity (08/14/2023)   Hunger Vital Sign    Worried About Running Out of Food in the Last Year: Never true    Ran Out of Food in the Last Year: Never true  Transportation Needs: No Transportation Needs (08/14/2023)   PRAPARE - Administrator, Civil Service (Medical): No    Lack of Transportation (Non-Medical): No  Physical Activity: Inactive (09/23/2022)   Exercise Vital Sign    Days of Exercise per Week: 0 days    Minutes of Exercise per Session: 0 min  Stress: No Stress Concern Present (09/23/2022)   [employer] of Occupational Health - Occupational Stress  Questionnaire    Feeling of Stress : Only a little  Social Connections: Moderately Isolated (08/14/2023)   Social Connection and Isolation Panel [NHANES]    Frequency of Communication with Friends and Family: More than three times a week    Frequency of Social Gatherings with Friends and Family: More than three times a week    Attends Religious Services: Never    Database administrator or Organizations: No    Attends Banker Meetings: Never    Marital Status: Married    No Known Allergies  Outpatient Medications Prior to Visit  Medication Sig Dispense Refill   Accu-Chek Softclix Lancets lancets Use to check blood sugar 3 times daily. 100 each 6   aspirin  81 MG chewable tablet Chew 1 tablet (81 mg total) by mouth daily. 90 tablet 1   atorvastatin  (LIPITOR) 40 MG tablet Take 1 tablet (40 mg total) by mouth at bedtime. 90 tablet 3   azaTHIOprine  (IMURAN ) 50 MG tablet Take 1 tablet (50 mg total) by mouth daily. 90 tablet 0   blood glucose meter kit and supplies KIT Use up to four times daily as  directed. 1 each 11   Blood Glucose Monitoring Suppl (ACCU-CHEK GUIDE) w/Device KIT Use to check blood sugar 3 times daily. 1 kit 0   clopidogrel  (PLAVIX ) 75 MG tablet Take 1 tablet (75 mg total) by mouth daily. 90 tablet 3   fenofibrate  (TRICOR ) 145 MG tablet Take 1 tablet (145 mg total) by mouth daily. 30 tablet 0   gabapentin  (NEURONTIN ) 300 MG capsule Take 1 capsule (300 mg total) by mouth 3 (three) times daily. 270 capsule 2   glucose blood (ACCU-CHEK GUIDE TEST) test strip Use to check blood sugar 3 times daily. 100 each 6   latanoprost  (XALATAN ) 0.005 % ophthalmic solution Place 1 drop into both eyes every evening. 5 mL 11   linaclotide  (LINZESS ) 145 MCG CAPS capsule Take 145 mcg by mouth daily before breakfast.     loratadine  (CLARITIN ) 10 MG tablet Take 1 tablet (10 mg total) by mouth daily as needed for allergies.     metoCLOPramide  (REGLAN ) 10 MG tablet Take 1 tablet at 5am the morning of surgery and then every 6 to 8 hours as needed for nausea. (Patient taking differently: Take 10 mg by mouth daily.) 30 tablet 3   prednisoLONE  acetate (PRED FORTE ) 1 % ophthalmic suspension Place 2 drops into both eyes every 4 (four) hours.     predniSONE  (DELTASONE ) 20 MG tablet Take 2 tablets (40 mg total) by mouth daily. 20 tablet 0   tiZANidine  (ZANAFLEX ) 4 MG tablet Take 1 tablet (4 mg total) by mouth every 8 (eight) hours as needed for muscle spasms. 60 tablet 1   traMADol  (ULTRAM ) 50 MG tablet Take 1 tablet (50 mg total) by mouth every 6 (six) hours as needed. 10 tablet 0   Continuous Glucose Sensor (FREESTYLE LIBRE 3 SENSOR) MISC USE AS DIRECTED 2 each 3   empagliflozin  (JARDIANCE ) 25 MG TABS tablet Take 1 tablet (25 mg total) by mouth daily before breakfast. 30 tablet 0   glipiZIDE  (GLUCOTROL ) 10 MG tablet Take 1 tablet (10 mg total) by mouth 2 (two) times daily. 60 tablet 0   Insulin  Glargine (BASAGLAR  KWIKPEN) 100 UNIT/ML Inject 38 Units into the skin daily. (Patient taking differently: Inject  39-40 Units into the skin at bedtime.) 18 mL 6   linaclotide  (LINZESS ) 290 MCG CAPS capsule Take 1 capsule (290 mcg total) by mouth  daily before breakfast. 90 capsule 1   metFORMIN  (GLUCOPHAGE ) 500 MG tablet Take 2 tablets (1,000 mg total) by mouth 2 (two) times daily with a meal. 360 tablet 1   metoprolol  succinate (TOPROL  XL) 25 MG 24 hr tablet Take 1 tablet (25 mg total) by mouth daily. 90 tablet 1   pantoprazole  (PROTONIX ) 40 MG tablet Take 1 tablet (40 mg total) by mouth daily before breakfast. (Stop taking omeprazole ). 90 tablet 1   predniSONE  (DELTASONE ) 10 MG tablet Take 3 tablets (30 mg total) by mouth daily for 7 days. 21 tablet 0   Semaglutide , 2 MG/DOSE, 8 MG/3ML SOPN Inject 2 mg as directed once a week. 3 mL 6   No facility-administered medications prior to visit.     ROS Review of Systems  Constitutional:  Negative for activity change and appetite change.  HENT:  Negative for sinus pressure and sore throat.   Respiratory:  Negative for chest tightness, shortness of breath and wheezing.   Cardiovascular:  Negative for chest pain and palpitations.  Gastrointestinal:  Negative for abdominal distention, abdominal pain and constipation.  Genitourinary: Negative.   Musculoskeletal: Negative.   Psychiatric/Behavioral:  Negative for behavioral problems and dysphoric mood.     Objective:  BP 128/84   Pulse 82   Ht 5\' 4"  (1.626 m)   Wt 235 lb 6.4 oz (106.8 kg)   SpO2 99%   BMI 40.41 kg/m      11/07/2023   10:32 AM 08/22/2023    8:38 AM 08/17/2023   11:11 AM  BP/Weight  Systolic BP 128 108 90  Diastolic BP 84 78 60  Wt. (Lbs) 235.4 224.8 225.7  BMI 40.41 kg/m2 38.59 kg/m2 38.74 kg/m2      Physical Exam Constitutional:      Appearance: She is well-developed.  Cardiovascular:     Rate and Rhythm: Normal rate.     Heart sounds: Normal heart sounds. No murmur heard. Pulmonary:     Effort: Pulmonary effort is normal.     Breath sounds: Normal breath sounds. No  wheezing or rales.  Chest:     Chest wall: No tenderness.  Abdominal:     General: Bowel sounds are normal. There is no distension.     Palpations: Abdomen is soft. There is no mass.     Tenderness: There is no abdominal tenderness.  Musculoskeletal:        General: Normal range of motion.     Right lower leg: No edema.     Left lower leg: No edema.  Neurological:     Mental Status: She is alert and oriented to person, place, and time.  Psychiatric:        Mood and Affect: Mood normal.        Latest Ref Rng & Units 08/10/2023    4:45 PM 07/19/2023   11:55 AM 03/29/2023   11:34 AM  CMP  Glucose 70 - 99 mg/dL 161  096  045   BUN 6 - 24 mg/dL 13  16  18    Creatinine 0.57 - 1.00 mg/dL 4.09  8.11  9.14   Sodium 134 - 144 mmol/L 134  137  139   Potassium 3.5 - 5.2 mmol/L 4.4  4.3  4.4   Chloride 96 - 106 mmol/L 103  101  104   CO2 20 - 29 mmol/L 21  17  22    Calcium  8.7 - 10.2 mg/dL 9.3  9.5  9.6   Total Protein 6.0 - 8.5  g/dL   7.7   Total Bilirubin 0.0 - 1.2 mg/dL   0.3   Alkaline Phos 44 - 121 IU/L   55   AST 0 - 40 IU/L   44   ALT 0 - 32 IU/L   39     Lipid Panel     Component Value Date/Time   CHOL 120 03/29/2023 1134   TRIG 127 03/29/2023 1134   HDL 33 (L) 03/29/2023 1134   CHOLHDL 6.0 12/21/2021 0925   VLDL 45 (H) 12/21/2021 0925   LDLCALC 64 03/29/2023 1134   LDLDIRECT 64 07/21/2022 1010    CBC    Component Value Date/Time   WBC 5.9 08/10/2023 1645   WBC 5.4 08/10/2022 0549   RBC 4.73 08/10/2023 1645   RBC 4.50 08/10/2022 0549   HGB 11.2 08/10/2023 1645   HCT 36.9 08/10/2023 1645   PLT 346 08/10/2023 1645   MCV 78 (L) 08/10/2023 1645   MCH 23.7 (L) 08/10/2023 1645   MCH 21.3 (L) 08/10/2022 0549   MCHC 30.4 (L) 08/10/2023 1645   MCHC 30.0 08/10/2022 0549   RDW 14.0 08/10/2023 1645   LYMPHSABS 2.2 03/29/2023 1134   MONOABS 0.5 12/20/2021 1616   EOSABS 0.3 03/29/2023 1134   BASOSABS 0.0 03/29/2023 1134    Lab Results  Component Value Date   HGBA1C  8.3 (A) 11/07/2023       Assessment & Plan Type 2 Diabetes Mellitus with hyperglycemia Elevated glucose due to prednisone  for autoimmune hepatitis. A1c increased to 8.3. Self-adjusting insulin , using mother's supply. No sliding scale for mealtime insulin . - Increase insulin  glargine (Basaglar ) to 60 units daily. - Provide sliding scale for Novolog  insulin  with meals. -For blood sugars 0-150 give 0 units of insulin , 151-200 give 2 units of insulin , 201-250 give 4 units, 251-300 give 6 units, 301-350 give 8 units, 351-400 give 10 units,> 400 give 12 units and call M.D. Discussed hypoglycemia protocol. - Refill all diabetes medications, including Ozempic  and continuous glucose monitor sensors. - Educated on using continuous glucose monitor and fingerstick checks for accuracy. -Counseled on Diabetic diet, my plate method, 096 minutes of moderate intensity exercise/week Blood sugar logs with fasting goals of 80-120 mg/dl, random of less than 045 and in the event of sugars less than 60 mg/dl or greater than 409 mg/dl encouraged to notify the clinic. Advised on the need for annual eye exams, annual foot exams, Pneumonia vaccine.   Autoimmune hepatitis Prednisone  treatment elevating glucose levels. Tapering dose should improve glucose control. -Continue azathioprine  per GI - Monitor blood glucose and adjust diabetes medications as prednisone  is reduced.  Urinary symptoms UA positive for glycosuria, nitrites, negative leukocyte esterase - Send urine sample for culture. - Contact with results and prescribe antibiotics if UTI confirmed.   Hypertension - Controlled - Continue antihypertensives -Counseled on blood pressure goal of less than 130/80, low-sodium, DASH diet, medication compliance, 150 minutes of moderate intensity exercise per week. Discussed medication compliance, adverse effects.   Hyperlipidemia - Controlled - Continue statin - Continue low-cholesterol  diet  GERD -Controlled - Continue PPI   Constipation - Controlled on Linzess  -Counseled on increasing fiber intake, fruits and vegetable, limit intake of foods like cheese, white bread, white rice   Health maintenance - PCV 20 administered. -Preventive exam at next visit Meds ordered this encounter  Medications   Insulin  Glargine (BASAGLAR  KWIKPEN) 100 UNIT/ML    Sig: Inject 60 Units into the skin daily.    Dispense:  30 mL  Refill:  6    Dose increase   empagliflozin  (JARDIANCE ) 25 MG TABS tablet    Sig: Take 1 tablet (25 mg total) by mouth daily before breakfast.    Dispense:  90 tablet    Refill:  1   glipiZIDE  (GLUCOTROL ) 10 MG tablet    Sig: Take 1 tablet (10 mg total) by mouth 2 (two) times daily.    Dispense:  180 tablet    Refill:  1   linaclotide  (LINZESS ) 290 MCG CAPS capsule    Sig: Take 1 capsule (290 mcg total) by mouth daily before breakfast.    Dispense:  90 capsule    Refill:  1    Dose increase   metFORMIN  (GLUCOPHAGE ) 500 MG tablet    Sig: Take 2 tablets (1,000 mg total) by mouth 2 (two) times daily with a meal.    Dispense:  360 tablet    Refill:  1   metoprolol  succinate (TOPROL  XL) 25 MG 24 hr tablet    Sig: Take 1 tablet (25 mg total) by mouth daily.    Dispense:  90 tablet    Refill:  1   pantoprazole  (PROTONIX ) 40 MG tablet    Sig: Take 1 tablet (40 mg total) by mouth daily before breakfast. (Stop taking omeprazole ).    Dispense:  90 tablet    Refill:  1   Semaglutide , 2 MG/DOSE, 8 MG/3ML SOPN    Sig: Inject 2 mg as directed once a week.    Dispense:  3 mL    Refill:  6   insulin  lispro (HUMALOG  KWIKPEN) 100 UNIT/ML KwikPen    Sig: Inject 0-12 Units into the skin 3 (three) times daily. As per sliding scale    Dispense:  30 mL    Refill:  11    For blood sugars 0-150 give 0 units of insulin , 151-200 give 2 units of insulin , 201-250 give 4 units, 251-300 give 6 units, 301-350 give 8 units, 351-400 give 10 units,> 400 give 12 units and  call M.D.   Continuous Glucose Sensor (FREESTYLE LIBRE 3 SENSOR) MISC    Sig: USE AS DIRECTED    Dispense:  3 each    Refill:  11    Visit required 47 minutes of patient care including median intraservice time, reviewing previous notes and test results, coordination of care, counseling the patient in addition to management of chronic medical conditions.Time also spent ordering medications, investigations and documenting in the chart.  All questions were answered to the patient's satisfaction  Follow-up: Return in about 6 weeks (around 12/19/2023) for CPE/ Preventive Health Exam.       Joaquin Mulberry, MD, FAAFP. [facility] [place], [place] [phone]   11/07/2023, 6:02 PM

## 2023-11-08 ENCOUNTER — Ambulatory Visit: Attending: Family Medicine

## 2023-11-08 DIAGNOSIS — E1169 Type 2 diabetes mellitus with other specified complication: Secondary | ICD-10-CM

## 2023-11-10 LAB — URINE CULTURE

## 2023-11-10 LAB — CMP14+EGFR
ALT: 17 IU/L (ref 0–32)
AST: 18 IU/L (ref 0–40)
Albumin: 4 g/dL (ref 3.9–4.9)
Alkaline Phosphatase: 72 IU/L (ref 44–121)
BUN/Creatinine Ratio: 20 (ref 9–23)
BUN: 17 mg/dL (ref 6–24)
Bilirubin Total: 0.3 mg/dL (ref 0.0–1.2)
CO2: 23 mmol/L (ref 20–29)
Calcium: 9.4 mg/dL (ref 8.7–10.2)
Chloride: 103 mmol/L (ref 96–106)
Creatinine, Ser: 0.85 mg/dL (ref 0.57–1.00)
Globulin, Total: 3.2 g/dL (ref 1.5–4.5)
Glucose: 158 mg/dL — ABNORMAL HIGH (ref 70–99)
Potassium: 4.1 mmol/L (ref 3.5–5.2)
Sodium: 139 mmol/L (ref 134–144)
Total Protein: 7.2 g/dL (ref 6.0–8.5)
eGFR: 84 mL/min/{1.73_m2} (ref >59)

## 2023-11-10 LAB — LP+NON-HDL CHOLESTEROL
Cholesterol, Total: 137 mg/dL (ref 100–199)
HDL: 41 mg/dL (ref >39)
LDL Chol Calc (NIH): 69 mg/dL (ref 0–99)
Total Non-HDL-Chol (LDL+VLDL): 96 mg/dL (ref 0–129)
Triglycerides: 154 mg/dL — ABNORMAL HIGH (ref 0–149)
VLDL Cholesterol Cal: 27 mg/dL (ref 5–40)

## 2023-11-10 LAB — MICROALBUMIN / CREATININE URINE RATIO
Creatinine, Urine: 83.8 mg/dL
Microalb/Creat Ratio: 93 mg/g{creat} — ABNORMAL HIGH (ref 0–29)
Microalbumin, Urine: 78.2 ug/mL

## 2023-11-13 ENCOUNTER — Other Ambulatory Visit: Payer: Self-pay

## 2023-11-13 ENCOUNTER — Other Ambulatory Visit: Payer: Self-pay | Admitting: Family Medicine

## 2023-11-13 MED ORDER — NITROFURANTOIN MONOHYD MACRO 100 MG PO CAPS
100.0000 mg | ORAL_CAPSULE | Freq: Two times a day (BID) | ORAL | 0 refills | Status: DC
  Filled 2023-11-13: qty 10, 5d supply, fill #0

## 2023-11-14 ENCOUNTER — Other Ambulatory Visit: Payer: Self-pay

## 2023-11-14 ENCOUNTER — Ambulatory Visit: Payer: Self-pay

## 2023-11-14 MED ORDER — BASAGLAR KWIKPEN 100 UNIT/ML ~~LOC~~ SOPN
60.0000 [IU] | PEN_INJECTOR | Freq: Every evening | SUBCUTANEOUS | 1 refills | Status: DC
  Filled 2023-11-14: qty 60, 100d supply, fill #0
  Filled 2023-12-21: qty 15, 25d supply, fill #0
  Filled 2024-01-16: qty 15, 25d supply, fill #1
  Filled 2024-02-06: qty 15, 25d supply, fill #2
  Filled 2024-03-06: qty 15, 25d supply, fill #3
  Filled 2024-04-03: qty 15, 25d supply, fill #4

## 2023-11-14 MED ORDER — MOUNJARO 10 MG/0.5ML ~~LOC~~ SOAJ
10.0000 mg | SUBCUTANEOUS | 5 refills | Status: DC
  Filled 2023-11-14: qty 2, 28d supply, fill #0
  Filled 2023-12-21: qty 2, 28d supply, fill #1
  Filled 2024-01-16: qty 2, 28d supply, fill #2
  Filled 2024-02-06: qty 2, 28d supply, fill #3
  Filled 2024-03-13: qty 2, 28d supply, fill #4
  Filled 2024-08-08: qty 2, 28d supply, fill #5

## 2023-11-14 MED ORDER — INSULIN ASPART 100 UNIT/ML FLEXPEN
PEN_INJECTOR | SUBCUTANEOUS | 5 refills | Status: DC
  Filled 2024-07-24: qty 9, 30d supply, fill #0

## 2023-11-16 ENCOUNTER — Other Ambulatory Visit: Payer: Self-pay

## 2023-11-22 ENCOUNTER — Other Ambulatory Visit: Payer: Self-pay | Admitting: Family Medicine

## 2023-11-22 ENCOUNTER — Other Ambulatory Visit: Payer: Self-pay

## 2023-11-22 DIAGNOSIS — E1169 Type 2 diabetes mellitus with other specified complication: Secondary | ICD-10-CM

## 2023-11-22 MED ORDER — FENOFIBRATE 145 MG PO TABS
145.0000 mg | ORAL_TABLET | Freq: Every day | ORAL | 6 refills | Status: DC
  Filled 2023-11-22: qty 30, 30d supply, fill #0
  Filled 2023-12-21: qty 30, 30d supply, fill #1
  Filled 2024-01-16: qty 30, 30d supply, fill #2
  Filled 2024-02-06 – 2024-02-08 (×2): qty 30, 30d supply, fill #3
  Filled 2024-03-13: qty 30, 30d supply, fill #4
  Filled 2024-04-19: qty 30, 30d supply, fill #5
  Filled 2024-05-27: qty 30, 30d supply, fill #6

## 2023-11-29 ENCOUNTER — Other Ambulatory Visit: Payer: Self-pay

## 2023-12-01 ENCOUNTER — Other Ambulatory Visit: Payer: Self-pay

## 2023-12-04 ENCOUNTER — Other Ambulatory Visit: Payer: Self-pay

## 2023-12-04 MED ORDER — PREDNISONE 5 MG PO TABS
5.0000 mg | ORAL_TABLET | Freq: Every day | ORAL | 0 refills | Status: DC
  Filled 2023-12-04: qty 7, 7d supply, fill #0

## 2023-12-21 ENCOUNTER — Other Ambulatory Visit (HOSPITAL_BASED_OUTPATIENT_CLINIC_OR_DEPARTMENT_OTHER): Payer: Self-pay

## 2023-12-21 ENCOUNTER — Other Ambulatory Visit: Payer: Self-pay | Admitting: Family Medicine

## 2023-12-21 ENCOUNTER — Telehealth: Payer: Self-pay | Admitting: Family Medicine

## 2023-12-21 ENCOUNTER — Other Ambulatory Visit: Payer: Self-pay

## 2023-12-21 ENCOUNTER — Other Ambulatory Visit (HOSPITAL_COMMUNITY): Payer: Self-pay

## 2023-12-21 DIAGNOSIS — M25512 Pain in left shoulder: Secondary | ICD-10-CM

## 2023-12-21 MED ORDER — TIZANIDINE HCL 4 MG PO TABS
4.0000 mg | ORAL_TABLET | Freq: Three times a day (TID) | ORAL | 0 refills | Status: DC | PRN
  Filled 2023-12-21: qty 60, 20d supply, fill #0

## 2023-12-21 NOTE — Telephone Encounter (Signed)
 Copied from CRM [id number]. Topic: Clinical - Prescription Issue >> Dec 21, 2023  1:08 PM Donald Frost wrote:  Reason for CRM: Alisa App the pharmacist at [facility] called in needing to speak with the provider or nurse to get some clarity on a prescription. She states Ozempic  and Mounjaro  have been called in by 2 different provider and she needs to know what to do at this point. She will try to call back at around 1:30. Please assist further  >> Dec 21, 2023  1:46 PM Ivette P wrote: Alisa App called in to follow up to speak with nurse, called [facility] spoke to [facility] and was notified CRM was already sent. And that nurse will reach out when available.   Alisa App left number for callback, asked to call [phone] will route to call center and ask for [facility].

## 2023-12-21 NOTE — Telephone Encounter (Signed)
 Copied from CRM [id number]. Topic: Clinical - Prescription Issue >> Dec 21, 2023  1:08 PM Donald Frost wrote:  Reason for CRM: Alisa App the pharmacist at [facility] called in needing to speak with the provider or nurse to get some clarity on a prescription. She states Ozempic  and Mounjaro  have been called in by 2 different provider and she needs to know what to do at this point. She will try to call back at around 1:30. Please assist further

## 2023-12-21 NOTE — Telephone Encounter (Signed)
 After reviewing epic mounjaro  was prescribed by Laurence Pons with [facility], I believe patient is being seen by 2 primary care physicians. Patient states that she want to continue care with Dr.Newlin.   Which medication does patient need to be taking?

## 2023-12-22 ENCOUNTER — Other Ambulatory Visit: Payer: Self-pay

## 2023-12-22 NOTE — Telephone Encounter (Signed)
 Patient was called and she states that she wants to continue the Mounjaro .

## 2023-12-22 NOTE — Telephone Encounter (Signed)
 Van Gelinas, can you please see message below and assist?  Please check with the patient, whichever GLP-1 RA she is currently taking, she can remain on that and we can discontinue the other one.  Thank you.

## 2023-12-25 NOTE — Telephone Encounter (Signed)
 Noted

## 2023-12-27 ENCOUNTER — Ambulatory Visit: Admitting: Family Medicine

## 2024-01-03 ENCOUNTER — Other Ambulatory Visit: Payer: Self-pay

## 2024-01-04 ENCOUNTER — Other Ambulatory Visit: Payer: Self-pay

## 2024-01-09 ENCOUNTER — Other Ambulatory Visit: Payer: Self-pay

## 2024-01-12 ENCOUNTER — Other Ambulatory Visit: Payer: Self-pay

## 2024-01-12 MED ORDER — AZATHIOPRINE 50 MG PO TABS
50.0000 mg | ORAL_TABLET | Freq: Every day | ORAL | 1 refills | Status: DC
  Filled 2024-01-16: qty 90, 90d supply, fill #0
  Filled 2024-04-19: qty 90, 90d supply, fill #1

## 2024-01-16 ENCOUNTER — Other Ambulatory Visit: Payer: Self-pay | Admitting: Family Medicine

## 2024-01-16 ENCOUNTER — Other Ambulatory Visit: Payer: Self-pay

## 2024-01-16 DIAGNOSIS — M25512 Pain in left shoulder: Secondary | ICD-10-CM

## 2024-01-16 MED ORDER — ACCU-CHEK GUIDE W/DEVICE KIT
PACK | 0 refills | Status: AC
  Filled 2024-01-16: qty 1, fill #0
  Filled 2024-01-22: qty 1, 30d supply, fill #0

## 2024-01-16 MED ORDER — TIZANIDINE HCL 4 MG PO TABS
4.0000 mg | ORAL_TABLET | Freq: Three times a day (TID) | ORAL | 0 refills | Status: DC | PRN
  Filled 2024-01-16: qty 60, 20d supply, fill #0

## 2024-01-17 ENCOUNTER — Other Ambulatory Visit: Payer: Self-pay

## 2024-01-22 ENCOUNTER — Other Ambulatory Visit: Payer: Self-pay

## 2024-01-23 ENCOUNTER — Telehealth: Payer: Self-pay | Admitting: Cardiology

## 2024-01-23 ENCOUNTER — Other Ambulatory Visit: Payer: Self-pay

## 2024-01-23 NOTE — Telephone Encounter (Signed)
 Patient wants a call back to discuss forms to be completed by Dr. Ladona for patient to have surgery.  Patient will need a Spanish interpreter.

## 2024-01-23 NOTE — Telephone Encounter (Signed)
 If she wants to switch provider than I will defer to Dr. Ladona.  If she wants to continue care w/ myself I can address it as Dr. Ladona was the proceduralist for the PFO closure.  Patient directed care.   Thanks   Dr.Marna Weniger

## 2024-01-23 NOTE — Telephone Encounter (Signed)
 Patient called in [facility] speaking) asking for Surgical Clearance for Gastric Bypass surgery. She states this was going to be done last year, but the operation was canceled by Anesthesia due to PFO and other Liver related problems. She had PFO Repair on 08/14/2023 by Dr. Ladona and also states Liver problems have also been addressed. She is asking that either Dr. Ladona or Dr. Michele fill out a form that the office [facility] in [place]) has asked her to have completed and returned to them before they will give her an appointment/re-consider the Gastric Bypass surgery.   Pt states she will drop off the form so that Dr. Neomia can fill out and if at all possible she would like to pick it up herself as soon as possible as she would like to take it to the [place] office asap. Per Dr. Ladona, she is to f/u with Dr. Michele in August 2025, but pt is requesting to see Dr. Ladona instead, and sooner so that she can be cleared for the pending gastric surgery.

## 2024-01-23 NOTE — Telephone Encounter (Signed)
 Paper Work Dropped Off: Surgery document for dr Ladona   Date: 01/23/2024  Location of paper: Dr Ladona mailbox  RN-Elena Zara is aware.

## 2024-01-23 NOTE — Telephone Encounter (Signed)
**  Patient stated she wanted to give the document to dr Ladona, however her provider is Michele, so will be leaving doc in [employer].

## 2024-01-23 NOTE — Telephone Encounter (Signed)
 Agree with Dr. Michele, she should keep her appointment with Dr. DEITRA unless she wants to follow up with me, then I will address the pre op also

## 2024-01-24 ENCOUNTER — Other Ambulatory Visit (HOSPITAL_COMMUNITY)
Admission: RE | Admit: 2024-01-24 | Discharge: 2024-01-24 | Disposition: A | Discharge status: Home / Self Care | Source: Ambulatory Visit | Attending: Family Medicine | Admitting: Family Medicine

## 2024-01-24 ENCOUNTER — Other Ambulatory Visit: Payer: Self-pay

## 2024-01-24 ENCOUNTER — Ambulatory Visit: Attending: Family Medicine | Admitting: Family Medicine

## 2024-01-24 ENCOUNTER — Encounter: Payer: Self-pay | Admitting: Family Medicine

## 2024-01-24 VITALS — BP 117/63 | HR 84 | Ht 64.0 in | Wt 242.0 lb

## 2024-01-24 DIAGNOSIS — Z113 Encounter for screening for infections with a predominantly sexual mode of transmission: Secondary | ICD-10-CM

## 2024-01-24 DIAGNOSIS — Z124 Encounter for screening for malignant neoplasm of cervix: Secondary | ICD-10-CM | POA: Insufficient documentation

## 2024-01-24 DIAGNOSIS — Z1231 Encounter for screening mammogram for malignant neoplasm of breast: Secondary | ICD-10-CM

## 2024-01-24 DIAGNOSIS — Z1211 Encounter for screening for malignant neoplasm of colon: Secondary | ICD-10-CM

## 2024-01-24 DIAGNOSIS — N3281 Overactive bladder: Secondary | ICD-10-CM | POA: Diagnosis not present

## 2024-01-24 DIAGNOSIS — Z0001 Encounter for general adult medical examination with abnormal findings: Secondary | ICD-10-CM | POA: Diagnosis not present

## 2024-01-24 MED ORDER — SOLIFENACIN SUCCINATE 5 MG PO TABS
5.0000 mg | ORAL_TABLET | Freq: Every day | ORAL | 1 refills | Status: DC
  Filled 2024-01-24: qty 90, 90d supply, fill #0
  Filled 2024-04-19: qty 90, 90d supply, fill #1

## 2024-01-24 NOTE — Progress Notes (Signed)
 Subjective:  Patient ID: Julie Robertson, female    DOB: [birth year]  Age: 48 y.o. MRN: [id number]  CC: Gynecologic Exam     Discussed the use of AI scribe software for clinical note transcription with the patient, who gave verbal consent to proceed.  History of Present Illness Julie Robertson is a 48 year old female with a history of left occipital lobe stroke in 12/2021, PFO, type 2 diabetes mellitus, dyslipidemia, GERD, OSA (on CPAP) nicotine  dependence  and autoimmune hepatitis (diagnosed after a liver biopsy) who presents for a complete physical exam and preventive healthcare.  She experiences urinary incontinence with urgency and occasional leakage if she does not reach the bathroom in time. This issue is ongoing and causes embarrassment.  Her menstrual cycles have become irregular over the past few months. Two months ago, she had a regular period, but it took two more months for her next period to occur. Recently, she experienced a short period lasting about a day and a half, with brownish discharge.  She has not engaged in regular exercise for about a month due to fatigue and dizziness, which she attributes to steroid medication. Prior to this, she was walking for about an hour regularly.  She is due for a cervical cancer, breast cancer and colon cancer screening and would also like to have a screening for STDs.  Past Medical History:  Diagnosis Date   Anxiety    Asthma    Coronary artery disease    CVA (cerebral vascular accident) (HCC)    Diabetes mellitus without complication (HCC)    Diabetic neuropathy (HCC) 12/2013   vs carpal tunnell.  numbness tingling in right fingers. rx with Gabapentin .   Dyslipidemia 08/2009   Dyspnea    Gall stones 08/2009   GERD (gastroesophageal reflux disease)    H/O tubal ligation    Obesity    BMI 41, 250# 03/2015    Past Surgical History:  Procedure Laterality Date   APPLICATION OF WOUND VAC N/A  05/09/2019   Procedure: APPLICATION OF WOUND VAC;  Surgeon: Desiderio Schanz, MD;  Location: [facility] ORS;  Service: General;  Laterality: N/A;  [id number]   BUBBLE STUDY  12/24/2021   Procedure: BUBBLE STUDY;  Surgeon: Pietro Redell RAMAN, MD;  Location: [facility] ENDOSCOPY;  Service: Cardiovascular;;   CHOLECYSTECTOMY N/A 03/10/2015   Procedure: LAPAROSCOPIC CHOLECYSTECTOMY WITH INTRAOPERATIVE CHOLANGIOGRAM;  Surgeon: Dann Hummer, MD;  Location: [facility] OR;  Service: General;  Laterality: N/A;   CORONARY BALLOON ANGIOPLASTY N/A 08/09/2022   Procedure: CORONARY BALLOON ANGIOPLASTY;  Surgeon: Ladona Heinz, MD;  Location: [facility] INVASIVE CV LAB;  Service: Cardiovascular;  Laterality: N/A;   ERCP N/A 03/11/2015   Procedure: ENDOSCOPIC RETROGRADE CHOLANGIOPANCREATOGRAPHY (ERCP);  Surgeon: Toribio SHAUNNA Cedar, MD;  Location: [facility] ENDOSCOPY;  Service: Endoscopy;  Laterality: N/A;   ESOPHAGOGASTRODUODENOSCOPY (EGD) WITH PROPOFOL  N/A 07/25/2018   Procedure: ESOPHAGOGASTRODUODENOSCOPY (EGD) WITH PROPOFOL ;  Surgeon: Wilhelmenia Aloha Raddle., MD;  Location: [facility] ENDOSCOPY;  Service: Gastroenterology;  Laterality: N/A;   INSERTION OF MESH N/A 05/09/2019   Procedure: INSERTION OF MESH;  Surgeon: Desiderio Schanz, MD;  Location: [facility] ORS;  Service: General;  Laterality: N/A;   LEFT HEART CATH AND CORONARY ANGIOGRAPHY N/A 08/09/2022   Procedure: LEFT HEART CATH AND CORONARY ANGIOGRAPHY;  Surgeon: Ladona Heinz, MD;  [facility] LAB;  Service: Cardiovascular;  Laterality: N/A;   PATENT FORAMEN OVALE(PFO) CLOSURE N/A 07/27/2023   Procedure: PATENT FORAMEN OVALE(PFO) CLOSURE;  Surgeon: Ladona Heinz, MD;  Location: [facility] INVASIVE  CV LAB;  Service: Cardiovascular;  Laterality: N/A;   PATENT FORAMEN OVALE(PFO) CLOSURE N/A 08/14/2023   Procedure: PATENT FORAMEN OVALE(PFO) CLOSURE;  Surgeon: Ladona Heinz, MD;  Location: [facility] INVASIVE CV LAB;  Service: Cardiovascular;  Laterality: N/A;   TEE WITHOUT CARDIOVERSION N/A 12/24/2021   Procedure: TRANSESOPHAGEAL  ECHOCARDIOGRAM (TEE);  Surgeon: Pietro Redell RAMAN, MD;  Location: [facility] ENDOSCOPY;  Service: Cardiovascular;  Laterality: N/A;   Transduodenal Ampullectomy  09/2015   TUBAL LIGATION     VENTRAL HERNIA REPAIR N/A 05/09/2019   Procedure: HERNIA REPAIR VENTRAL ADULT with MESH;  Surgeon: Desiderio Schanz, MD;  Location: [facility] ORS;  Service: General;  Laterality: N/A;    Family History  Adopted: Yes  Problem Relation Age of Onset   Diabetes Mother    Heart disease Mother    Hypertension Mother    Heart attack Father    Diabetes Father    Kidney disease Maternal Uncle    Bone cancer Maternal Grandfather    Other Son        had kidney removed due to gun shot wound    Social History   Socioeconomic History   Marital status: Married    Spouse name: Not on file   Number of children: 2   Years of education: Not on file   Highest education level: Not on file  Occupational History   Occupation: house cleaning  Tobacco Use   Smoking status: Former    Current packs/day: 1.50    Average packs/day: 1.5 packs/day for 4.0 years (6.0 ttl pk-yrs)    Types: Cigarettes    Passive exposure: Never   Smokeless tobacco: Never  Vaping Use   Vaping status: Never Used  Substance and Sexual Activity   Alcohol  use: Yes    Comment: rare   Drug use: No   Sexual activity: Not Currently  Other Topics Concern   Not on file  Social History Narrative   Patient has 2 sons one is 63, the other 52 as of 03/2015. Her kids are not the children of her current husband. As of 03/2015 she is not employed outside the home.   Social Drivers of Corporate investment banker Strain: Low Risk  (03/22/2023)   Received from [facility]   Overall Financial Resource Strain (CARDIA)    Difficulty of Paying Living Expenses: Not hard at all  Food Insecurity: No Food Insecurity (08/14/2023)   Hunger Vital Sign    Worried About Running Out of Food in the Last Year: Never true    Ran Out of Food in the Last Year: Never true   Transportation Needs: No Transportation Needs (08/14/2023)   PRAPARE - Administrator, Civil Service (Medical): No    Lack of Transportation (Non-Medical): No  Physical Activity: Inactive (09/23/2022)   Exercise Vital Sign    Days of Exercise per Week: 0 days    Minutes of Exercise per Session: 0 min  Stress: No Stress Concern Present (09/23/2022)   [employer] of Occupational Health - Occupational Stress Questionnaire    Feeling of Stress : Only a little  Social Connections: Moderately Isolated (08/14/2023)   Social Connection and Isolation Panel    Frequency of Communication with Friends and Family: More than three times a week    Frequency of Social Gatherings with Friends and Family: More than three times a week    Attends Religious Services: Never    Database administrator or Organizations: No    Attends [employer]  or Organization Meetings: Never    Marital Status: Married    No Known Allergies  Outpatient Medications Prior to Visit  Medication Sig Dispense Refill   Accu-Chek Softclix Lancets lancets Use to check blood sugar 3 times daily. 100 each 6   aspirin  81 MG chewable tablet Chew 1 tablet (81 mg total) by mouth daily. 90 tablet 1   atorvastatin  (LIPITOR) 40 MG tablet Take 1 tablet (40 mg total) by mouth at bedtime. 90 tablet 3   azaTHIOprine  (IMURAN ) 50 MG tablet Take 1 tablet (50 mg total) by mouth daily. 90 tablet 1   blood glucose meter kit and supplies KIT Use up to four times daily as directed. 1 each 11   Blood Glucose Monitoring Suppl (ACCU-CHEK GUIDE) w/Device KIT Use to check blood sugar 3 times daily. 1 kit 0   Continuous Glucose Sensor (FREESTYLE LIBRE 3 SENSOR) MISC USE AS DIRECTED 3 each 11   empagliflozin  (JARDIANCE ) 25 MG TABS tablet Take 1 tablet (25 mg total) by mouth daily before breakfast. 90 tablet 1   fenofibrate  (TRICOR ) 145 MG tablet Take 1 tablet (145 mg total) by mouth daily. 30 tablet 6   gabapentin  (NEURONTIN ) 300 MG capsule Take 1  capsule (300 mg total) by mouth 3 (three) times daily. 270 capsule 2   glipiZIDE  (GLUCOTROL ) 10 MG tablet Take 1 tablet (10 mg total) by mouth 2 (two) times daily. 180 tablet 1   glucose blood (ACCU-CHEK GUIDE TEST) test strip Use to check blood sugar 3 times daily. 100 each 6   Insulin  Glargine (BASAGLAR  KWIKPEN) 100 UNIT/ML Inject 60 Units into the skin at bedtime. 60 mL 1   insulin  lispro (HUMALOG  KWIKPEN) 100 UNIT/ML KwikPen Inject according to sliding scale MDD 30 units Subcutaneous three times daily OK to sub Novolog  30 days 9 mL 5   latanoprost  (XALATAN ) 0.005 % ophthalmic solution Place 1 drop into both eyes every evening. 5 mL 11   linaclotide  (LINZESS ) 145 MCG CAPS capsule Take 145 mcg by mouth daily before breakfast.     linaclotide  (LINZESS ) 290 MCG CAPS capsule Take 1 capsule (290 mcg total) by mouth daily before breakfast. 90 capsule 1   loratadine  (CLARITIN ) 10 MG tablet Take 1 tablet (10 mg total) by mouth daily as needed for allergies.     metFORMIN  (GLUCOPHAGE ) 500 MG tablet Take 2 tablets (1,000 mg total) by mouth 2 (two) times daily with a meal. 360 tablet 1   metoCLOPramide  (REGLAN ) 10 MG tablet Take 1 tablet at 5am the morning of surgery and then every 6 to 8 hours as needed for nausea. (Patient taking differently: Take 10 mg by mouth daily.) 30 tablet 3   metoprolol  succinate (TOPROL  XL) 25 MG 24 hr tablet Take 1 tablet (25 mg total) by mouth daily. 90 tablet 1   nitrofurantoin , macrocrystal-monohydrate, (MACROBID ) 100 MG capsule Take 1 capsule (100 mg total) by mouth 2 (two) times daily. 10 capsule 0   pantoprazole  (PROTONIX ) 40 MG tablet Take 1 tablet (40 mg total) by mouth daily before breakfast. (Stop taking omeprazole ). 90 tablet 1   prednisoLONE  acetate (PRED FORTE ) 1 % ophthalmic suspension Place 2 drops into both eyes every 4 (four) hours.     tirzepatide  (MOUNJARO ) 10 MG/0.5ML Pen Inject 10 mg into the skin once a week. 2 mL 5   tiZANidine  (ZANAFLEX ) 4 MG tablet Take 1  tablet (4 mg total) by mouth every 8 (eight) hours as needed for muscle spasms. 60 tablet 0  traMADol  (ULTRAM ) 50 MG tablet Take 1 tablet (50 mg total) by mouth every 6 (six) hours as needed. 10 tablet 0   No facility-administered medications prior to visit.     ROS Review of Systems  Constitutional:  Negative for activity change and appetite change.  HENT:  Negative for sinus pressure and sore throat.   Respiratory:  Negative for chest tightness, shortness of breath and wheezing.   Cardiovascular:  Negative for chest pain and palpitations.  Gastrointestinal:  Negative for abdominal distention, abdominal pain and constipation.  Genitourinary: Negative.   Musculoskeletal: Negative.   Psychiatric/Behavioral:  Negative for behavioral problems and dysphoric mood.     Objective:  BP 117/63   Pulse 84   Ht 5' 4 (1.626 m)   Wt 242 lb (109.8 kg)   SpO2 98%   BMI 41.54 kg/m      01/24/2024    4:03 PM 11/07/2023   10:32 AM 08/22/2023    8:38 AM  BP/Weight  Systolic BP 117 128 108  Diastolic BP 63 84 78  Wt. (Lbs) 242 235.4 224.8  BMI 41.54 kg/m2 40.41 kg/m2 38.59 kg/m2      Physical Exam Exam conducted with a chaperone present.  Constitutional:      General: She is not in acute distress.    Appearance: She is well-developed. She is not diaphoretic.  HENT:     Head: Normocephalic.     Right Ear: External ear normal.     Left Ear: External ear normal.     Nose: Nose normal.  Eyes:     Conjunctiva/sclera: Conjunctivae normal.     Pupils: Pupils are equal, round, and reactive to light.  Neck:     Vascular: No JVD.  Cardiovascular:     Rate and Rhythm: Normal rate and regular rhythm.     Heart sounds: Normal heart sounds. No murmur heard.    No gallop.  Pulmonary:     Effort: Pulmonary effort is normal. No respiratory distress.     Breath sounds: Normal breath sounds. No wheezing or rales.  Chest:     Chest wall: No tenderness.  Breasts:    Right: Normal. No mass,  nipple discharge or tenderness.     Left: Normal. No mass, nipple discharge or tenderness.  Abdominal:     General: Bowel sounds are normal. There is no distension.     Palpations: Abdomen is soft. There is no mass.     Tenderness: There is no abdominal tenderness.     Hernia: There is no hernia in the left inguinal area or right inguinal area.  Genitourinary:    General: Normal vulva.     Pubic Area: No rash.      Labia:        Right: No rash.        Left: No rash.      Vagina: Normal.     Cervix: Normal.     Uterus: Normal.      Adnexa: Right adnexa normal and left adnexa normal.       Right: No tenderness.         Left: No tenderness.    Musculoskeletal:        General: No tenderness. Normal range of motion.     Cervical back: Normal range of motion. No tenderness.  Lymphadenopathy:     Upper Body:     Right upper body: No supraclavicular or axillary adenopathy.     Left upper body: No supraclavicular or  axillary adenopathy.  Skin:    General: Skin is warm and dry.  Neurological:     Mental Status: She is alert and oriented to person, place, and time.     Deep Tendon Reflexes: Reflexes are normal and symmetric.        Latest Ref Rng & Units 11/08/2023   11:09 AM 08/10/2023    4:45 PM 07/19/2023   11:55 AM  CMP  Glucose 70 - 99 mg/dL 841  825  741   BUN 6 - 24 mg/dL 17  13  16    Creatinine 0.57 - 1.00 mg/dL 9.14  9.22  9.10   Sodium 134 - 144 mmol/L 139  134  137   Potassium 3.5 - 5.2 mmol/L 4.1  4.4  4.3   Chloride 96 - 106 mmol/L 103  103  101   CO2 20 - 29 mmol/L 23  21  17    Calcium  8.7 - 10.2 mg/dL 9.4  9.3  9.5   Total Protein 6.0 - 8.5 g/dL 7.2     Total Bilirubin 0.0 - 1.2 mg/dL 0.3     Alkaline Phos 44 - 121 IU/L 72     AST 0 - 40 IU/L 18     ALT 0 - 32 IU/L 17       Lipid Panel     Component Value Date/Time   CHOL 137 11/08/2023 1109   TRIG 154 (H) 11/08/2023 1109   HDL 41 11/08/2023 1109   CHOLHDL 6.0 12/21/2021 0925   VLDL 45 (H) 12/21/2021 0925    LDLCALC 69 11/08/2023 1109   LDLDIRECT 64 07/21/2022 1010    CBC    Component Value Date/Time   WBC 5.9 08/10/2023 1645   WBC 5.4 08/10/2022 0549   RBC 4.73 08/10/2023 1645   RBC 4.50 08/10/2022 0549   HGB 11.2 08/10/2023 1645   HCT 36.9 08/10/2023 1645   PLT 346 08/10/2023 1645   MCV 78 (L) 08/10/2023 1645   MCH 23.7 (L) 08/10/2023 1645   MCH 21.3 (L) 08/10/2022 0549   MCHC 30.4 (L) 08/10/2023 1645   MCHC 30.0 08/10/2022 0549   RDW 14.0 08/10/2023 1645   LYMPHSABS 2.2 03/29/2023 1134   MONOABS 0.5 12/20/2021 1616   EOSABS 0.3 03/29/2023 1134   BASOSABS 0.0 03/29/2023 1134    Lab Results  Component Value Date   HGBA1C 8.3 (A) 11/07/2023      1. Annual visit for general adult medical examination with abnormal findings (Primary) Counseled on 150 minutes of exercise per week, healthy eating (including decreased daily intake of saturated fats, cholesterol, added sugars, sodium), routine healthcare maintenance.   2. Screening for cervical cancer - Cytology - PAP  3. Encounter for screening mammogram for malignant neoplasm of breast - MM 3D SCREENING MAMMOGRAM BILATERAL BREAST; Future  4. Screening for colon cancer - Ambulatory referral to Gastroenterology  5. Overactive bladder - solifenacin  (VESICARE ) 5 MG tablet; Take 1 tablet (5 mg total) by mouth daily.  Dispense: 90 tablet; Refill: 1  6. Screening for STD (sexually transmitted disease) - Cervicovaginal ancillary only   Meds ordered this encounter  Medications   solifenacin  (VESICARE ) 5 MG tablet    Sig: Take 1 tablet (5 mg total) by mouth daily.    Dispense:  90 tablet    Refill:  1    Follow-up: Return in about 1 month (around 02/24/2024) for Chronic medical conditions.       Corrina Sabin, MD, FAAFP. [facility] [place], [place] [phone]   01/24/2024, 5:53 PM

## 2024-01-24 NOTE — Telephone Encounter (Signed)
 Called and spoke to pt to clarify that her assigned MD here at [facility] is Dr. Michele. Also explained to her that if she is needing cardiac clearance for an upcoming Gastric bypass, then it would be faster/smoother process/easier to continue under Dr. Tyree care for now. Pt states she is fine with seeing Dr. Michele, she just thought that Dr. Ladona was her doctor since she has seen him more often.   Advised pt to try to activate her Mychart and that way communication could be less complicated since she has had difficulty reaching someone at [facility] over the phone. Gave her mychart tech help phone number. She is asking that the form she dropped off on 01/23/24 be filled out and faxed to the [place] office so that she can then make an appointment there. She is asking someone notify her when it has been faxed and shares that previously (at Dr. Tyree previous office) she had dropped off a form and it was never faxed/completed.   For now, we have made a 6 month follow up visit with Dr. Michele on 02/14/24; patient informed that there is a Pre Op team here at [facility] that can help with the clearance part for her procedure. She verbalized understanding. All information was in [place].

## 2024-01-24 NOTE — Patient Instructions (Signed)
 Vejiga hiperactiva en adultos: qu debe saber Overactive Bladder in Adults: What to Know  La vejiga hiperactiva (VH) ocurre cuando uno tiene dificultad para contener la orina. Puede sentir la necesidad de Geographical information systems officer con frecuencia o de repente. Tambin puede tener fugas de orina si no puede llegar al bao con la rapidez suficiente. Estos sntomas pueden obstaculizar su vida diaria. Cules son las causas? La VH puede deberse a un problema en las seales nerviosas entre la vejiga y Secretary/administrator. La vejiga puede recibir la seal de vaciarse antes de que est llena. O bien, la vejiga puede ser demasiado sensible. Esto puede hacer que se comprima demasiado pronto. Otras causas son: Problemas mdicos, como: Una infeccin de las vas urinarias (IU). Hinchazn o clculos en la vejiga. Un tumor, que es un crecimiento de clulas que no es normal. Diabetes. Debilidad muscular o nerviosa. Las causas de esto pueden ser las siguientes: Una lesin en la mdula espinal. Un accidente cerebrovascular. Esclerosis mltiple. Ciruga en tejidos cercanos. Consumo de cafena o alcohol. Algunos medicamentos. Estos incluyen los que reducen la cantidad de lquido en el cuerpo. Dificultades para defecar, lo que tambin se llama estreimiento. Qu incrementa el riesgo? Usted puede correr ms riesgo si: Es Psychiatrist. Est atravesando la menopausia. Tiene la prstata grande o problemas con la prstata. Tiene sobrepeso. Fuma. Ingiere alimentos o bebidas que irritan la vejiga. Estos pueden incluir el t y los alimentos muy condimentados. Cules son los signos o sntomas? Necesidad repentina e intensa de Geographical information systems officer. Orinar 8 o ms veces al [employer]. Tener fugas de [place]. Levantarse para orinar 2 o ms veces por noche. Cmo se diagnostica? Se puede diagnosticar en funcin de los sntomas, los antecedentes mdicos y un examen. Tambin pueden hacerle pruebas. Pueden incluir: Anlisis de sangre. Pruebas de orina para  determinar si hay infeccin. Pruebas para estudiar el flujo de la [place]. Estas se llaman pruebas urodinmicas. Tambin es posible que deba consultar a un experto en problemas de la vejiga llamado urlogo. Cmo se trata? El tratamiento depende de la causa y de la gravedad de sus sntomas. Puede incluir lo siguiente: Entrenamiento de la vejiga, por ejemplo: Aprender a controlar la necesidad imperiosa de orinar al orinar a ciertas horas o momentos. Haga los ejercicios de Kegel. Estos pueden ayudarlo a [employer] que inician y detienen el flujo de [place]. Dispositivos especiales, por ejemplo: Biorretroalimentacin. Este tratamiento [place] sensores para ayudarlo a aprender [employer]. Estimulacin elctrica. Este tratamiento [place] electricidad para [employer] nervios y los msculos de la vejiga funcionen. Un pesario. Esto puede introducirse en la vagina para sostener la vejiga. Medicamentos para: Warehouse manager una infeccin urinaria (IU). Evitar que la vejiga libere orina en el momento incorrecto. Calmar los msculos de la vejiga. Ciruga para: Ayudar a las [employer] que controlan la [place]. Cambiar la forma de la vejiga. Esto se realiza solamente en [employer] graves. Siga estas instrucciones en su casa: Comida y bebida  [employer] cambios en su dieta que le hayan indicado. Es posible que deba hacer lo siguiente: Product manager pequeas cantidades de lquido Freight forwarder de grandes cantidades de una vez. Consumir menos cafena o alcohol. Tomar medidas para tratar o prevenir los problemas para defecar. Posiblemente deba consumir alimentos ricos en fibra, como legumbres, cereales integrales, y frutas y verduras frescas. Estilo de vida [place] de [place], si es necesario. No fume, vapee ni consuma nicotina o tabaco. Instrucciones generales [place] los medicamentos nicamente segn las indicaciones. Si le han  recetado antibiticos, tmelos como se lo hayan indicado. No deje de tomarlos  aunque comience a sentirse mejor. Use los dispositivos como se lo hayan indicado. Si es necesario, use apsitos para absorber cualquier fuga de orina que pueda [place]. Lleve un registro de cunto bebe, cundo bebe y cundo aparece la necesidad de Geographical information systems officer. Comunquese con un mdico si: Los sntomas no mejoran con Scientist, research (medical). No puede controlar la vejiga. Tiene fiebre o siente escalofros. Esta informacin no tiene Theme park manager el consejo del mdico. Asegrese de hacerle al mdico cualquier pregunta que tenga. Document Revised: 04/17/2023 Document Reviewed: 04/17/2023 Elsevier Patient Education  2024 [employer].

## 2024-01-26 LAB — CERVICOVAGINAL ANCILLARY ONLY
Bacterial Vaginitis (gardnerella): NEGATIVE
Candida Glabrata: POSITIVE — AB
Candida Vaginitis: NEGATIVE
Chlamydia: NEGATIVE
Comment: NEGATIVE
Comment: NEGATIVE
Comment: NEGATIVE
Comment: NEGATIVE
Comment: NEGATIVE
Comment: NORMAL
Neisseria Gonorrhea: NEGATIVE
Trichomonas: NEGATIVE

## 2024-01-31 ENCOUNTER — Telehealth: Payer: Self-pay | Admitting: Cardiology

## 2024-01-31 NOTE — Telephone Encounter (Signed)
 Spoke with the patient with the pacific interpreter regarding pre-op clearance paperwork. I was able to get a hold of Dr. Michele in the office and he stated he will fill out the form and we will send it over to her. The patient will be notified when it is sent out.

## 2024-01-31 NOTE — Telephone Encounter (Signed)
 Spoke to the patient on the phone with the Spanish Pacific interpreter name Ouida ID # [id number]. Dr. Michele filled out her pre-op clearance form and I faxed it over to the provided fax number on the form. Patient was told all of this and was appreciative of us  for getting that done for her.

## 2024-01-31 NOTE — Telephone Encounter (Signed)
 Pt would like a c/b regarding paperwork. Please advise

## 2024-02-02 ENCOUNTER — Other Ambulatory Visit: Payer: Self-pay

## 2024-02-02 ENCOUNTER — Ambulatory Visit: Payer: Self-pay | Admitting: Family Medicine

## 2024-02-02 ENCOUNTER — Ambulatory Visit

## 2024-02-02 DIAGNOSIS — E1169 Type 2 diabetes mellitus with other specified complication: Secondary | ICD-10-CM

## 2024-02-02 LAB — CYTOLOGY - PAP
Comment: NEGATIVE
Diagnosis: NEGATIVE
Diagnosis: REACTIVE
High risk HPV: NEGATIVE

## 2024-02-02 MED ORDER — FLUCONAZOLE 150 MG PO TABS
150.0000 mg | ORAL_TABLET | Freq: Once | ORAL | 0 refills | Status: AC
  Filled 2024-02-02: qty 1, 1d supply, fill #0

## 2024-02-02 NOTE — Telephone Encounter (Signed)
 Refer to telephone encounter from 7/30. Pt has been made aware that paperwork has been completed and has been faxed.

## 2024-02-06 ENCOUNTER — Other Ambulatory Visit: Payer: Self-pay

## 2024-02-08 ENCOUNTER — Other Ambulatory Visit: Payer: Self-pay

## 2024-02-08 ENCOUNTER — Ambulatory Visit
Admission: RE | Admit: 2024-02-08 | Discharge: 2024-02-08 | Disposition: A | Discharge status: Home / Self Care | Source: Ambulatory Visit | Attending: Family Medicine | Admitting: Family Medicine

## 2024-02-08 DIAGNOSIS — Z1231 Encounter for screening mammogram for malignant neoplasm of breast: Secondary | ICD-10-CM

## 2024-02-13 ENCOUNTER — Other Ambulatory Visit: Payer: Self-pay | Admitting: Family Medicine

## 2024-02-13 ENCOUNTER — Other Ambulatory Visit: Payer: Self-pay

## 2024-02-13 DIAGNOSIS — R928 Other abnormal and inconclusive findings on diagnostic imaging of breast: Secondary | ICD-10-CM

## 2024-02-14 ENCOUNTER — Telehealth: Payer: Self-pay

## 2024-02-14 ENCOUNTER — Other Ambulatory Visit: Payer: Self-pay

## 2024-02-14 ENCOUNTER — Encounter: Payer: Self-pay | Admitting: Cardiology

## 2024-02-14 ENCOUNTER — Ambulatory Visit: Attending: Cardiovascular Disease | Admitting: Cardiology

## 2024-02-14 VITALS — BP 112/78 | HR 81 | Resp 16 | Ht 64.0 in | Wt 239.6 lb

## 2024-02-14 DIAGNOSIS — Q2112 Patent foramen ovale: Secondary | ICD-10-CM | POA: Diagnosis present

## 2024-02-14 DIAGNOSIS — I2510 Atherosclerotic heart disease of native coronary artery without angina pectoris: Secondary | ICD-10-CM | POA: Diagnosis present

## 2024-02-14 DIAGNOSIS — Z87891 Personal history of nicotine dependence: Secondary | ICD-10-CM | POA: Diagnosis present

## 2024-02-14 DIAGNOSIS — Z794 Long term (current) use of insulin: Secondary | ICD-10-CM | POA: Diagnosis present

## 2024-02-14 DIAGNOSIS — I639 Cerebral infarction, unspecified: Secondary | ICD-10-CM

## 2024-02-14 DIAGNOSIS — E1165 Type 2 diabetes mellitus with hyperglycemia: Secondary | ICD-10-CM

## 2024-02-14 DIAGNOSIS — I10 Essential (primary) hypertension: Secondary | ICD-10-CM | POA: Insufficient documentation

## 2024-02-14 DIAGNOSIS — E785 Hyperlipidemia, unspecified: Secondary | ICD-10-CM | POA: Diagnosis present

## 2024-02-14 DIAGNOSIS — Z9861 Coronary angioplasty status: Secondary | ICD-10-CM

## 2024-02-14 DIAGNOSIS — Z6841 Body Mass Index (BMI) 40.0 and over, adult: Secondary | ICD-10-CM | POA: Diagnosis present

## 2024-02-14 DIAGNOSIS — I253 Aneurysm of heart: Secondary | ICD-10-CM | POA: Diagnosis present

## 2024-02-14 DIAGNOSIS — E66813 Obesity, class 3: Secondary | ICD-10-CM

## 2024-02-14 NOTE — Progress Notes (Signed)
 Cardiology Office Note:  .   Date:  02/14/2024  ID:  Julie Robertson, DOB 06-12-1976, MRN [id number] PCP:  Julie Clam, MD  Former Cardiology Providers: N/A [facility] Providers Cardiologist:  Julie Large, DO , [facility] (established care in January 2024) Electrophysiologist:  None  Click to update primary MD,subspecialty MD or APP then REFRESH:1}    Chief Complaint  Patient presents with   Atherosclerosis of native coronary artery of native heart w   Follow-up    History of Present Illness: .   Julie Robertson is a 48 y.o. Spanish-speaking[place] female whose past medical history and cardiovascular risk factors includes: Status post PFO closure with 30 mm cardioform septal occluder (08/14/2023), coronary artery disease status post PTCA and Cutting Balloon angioplasty to mLCx, left occipital lobe stroke in June 2023, prior infarct right corona radiata (per imaging), insulin  dependent type 2 diabetes, dyslipidemia, GERD, .former smoker (38 pack year history), sleep apnea on device therapy, obesity due to excess calories.   In June 2023 patient had undergone a stroke workup was noted to have a positive PFO by transcranial Doppler as well as TEE.  However due to lack of insurance patient did not establish care with cardiology until January 2024.  Zio patch did not illustrate atrial fibrillation during the monitoring period.  We have discussed loop recorder implant however patient wanted to hold off.  In the interim she developed chest pain concerning for cardiac discomfort and underwent ischemic workup was noted to have obstructive disease in the LCx distribution and underwent coronary intervention.  Since last office visit patient underwent PFO closure with Dr. Ladona and has done well overall.  Since then she also reached out to the office for preoperative risk stratification for undergoing bariatric surgery this was provided to her as well.  She presents today  for follow-up.  She presents to the office with Julie Robertson an interpreter to help translate between [place] and [place].  Since last office visit she denies anginal chest pain or heart failure symptoms.  Overall physical activity is limited due to personal reasons and not symptoms.  She scheduled for her bariatric surgery on February 28, 2024.   Review of Systems: .   Review of Systems  Constitutional: Positive for weight gain.  Cardiovascular:  Negative for chest pain, claudication, irregular heartbeat, leg swelling, near-syncope, orthopnea, palpitations, paroxysmal nocturnal dyspnea and syncope.  Respiratory:  Negative for shortness of breath.   Hematologic/Lymphatic: Negative for bleeding problem.    Studies Reviewed:    Echocardiogram: 12/21/2021:  1. Left ventricular ejection fraction, by estimation, is 60 to 65%. The  left ventricle has normal function. The left ventricle has no regional  wall motion abnormalities. There is mild concentric left ventricular  hypertrophy. Left ventricular diastolic  parameters were normal.   2. Right ventricular systolic function is normal. The right ventricular  size is normal. Tricuspid regurgitation signal is inadequate for assessing  PA pressure.   3. A small pericardial effusion is present. The pericardial effusion is  circumferential.   4. The mitral valve is normal in structure. No evidence of mitral valve  regurgitation. No evidence of mitral stenosis.   5. The aortic valve is tricuspid. There is mild thickening of the aortic  valve. Aortic valve regurgitation is not visualized. No aortic stenosis is  present.      Transesophageal echocardiogram 12/24/2021: Atrial septal aneurysm; no shunting noted with color doppler but positive saline microcavitation study suggests PFO. 2.Left ventricular ejection fraction, by  estimation, is 55 to 60%. The left ventricle has normal function. The left ventricle has no regional wall motion  abnormalities. 3. Right ventricular systolic function is normal. The right ventricular size is normal. 4. No left atrial/left atrial appendage thrombus was detected. 5. The mitral valve is normal in structure. Trivial mitral valve regurgitation. The aortic valve is tricuspid. Aortic valve regurgitation is trivial. Aortic valve sclerosis is present, with no evidence of aortic valve stenosis. 6. Agitated saline contrast bubble study was positive with shunting observed within 3-6 cardiac cycles suggestive of interatrial shunt.   Stress Testing: Exercise nuclear stress test 07/19/2022: Myocardial perfusion is abnormal. Prominent breast attenuation in the inferior wall.  There is mild mid to apical anteroseptal thinning in stress images suggestive of a small reversible mild defect. TID ratio 1.43, which is abnormal. Stress LV EF is normal 52%. Overall LV systolic function is normal without regional wall motion abnormalities. Stress LV EF: 52%.  Normal ECG stress. The patient exercised for 4 minutes and 13 seconds of a Bruce protocol, achieving approximately 6.1 METs & 86% MPHR. NO chest pain, stress terminated due to THR achieved. Normal BP response.  No previous exam available for comparison. Intermediate risk due to abnormal TID ratio. Clinical correlation recommended in a patient with BMI >40. ~~Personally reviewed myocardial perfusion imaging which is more concerning for reversible ischemia in the basal to mid inferolateral and apical lateral segments suggestive of ischemia in the LCx distribution.   Heart Catheterization: Left Heart Catheterization 08/09/22:  LV 130/4, EDP 12 mmHg.  Ao 07/04/2010/72, mean 90 mmHg.  No pressure gradient across the aortic valve. LM: Robertson-caliber vessel.  Smooth and normal. LAD: Robertson-caliber vessel.  It is smooth and normal.  Gives origin to a very Robertson diagonal 1 with a secondary branch.  LAD ends before reaching the apex. LCx: Robertson-caliber vessel.  Gives  origin to a very tiny OM1, continues as Robertson OM 2 and 3.  There is a focal 90% concentric stenosis. RCA: Robertson-caliber vessel.  Slow flow is noted in the right coronary artery.  Mid segment has a minimal luminal irregularity.   Intervention data: Successful PTCA and scoring balloon angioplasty with a 3.5 x 10 mm Wolverine cutting balloon.  Stenosis reduced from 90% to 0% with TIMI-3 to TIMI-3 flow, much better flow postangioplasty.      Recommendation: Patient will need at least 3 months of Plavix , aspirin  indefinitely.  Transcranial Doppler with bubbles: 12/21/2021 Positive TCD Bubble study with valasalva only indicative of a small right to left shunt   RADIOLOGY: CT brain without contrast 12/20/2021: Chronic changes with no acute intracranial process identified.    MRI brain with and without contrast 12/21/2021: Brain MRI: 1. Patchy acute cortical infarcts in the left occipital lobe with extension along the posterior left watershed. 2. Remote perforator infarct the right corona radiata.   Cervical spine: 1. Normal motion degraded appearance of the cord. 2. C5-6 Robertson right paracentral to foraminal protrusion encroaching on the right cord and causing right foraminal impingement.   CT angio head and neck with and without contrast code stroke protocol 12/21/2021: 1. No emergent vascular finding. 2. Atherosclerosis without flow limiting stenosis of major vessels. 3. Left basal ganglia and left occipital cortex calcifications, are there risk factors for remote neurocysticercosis?   CT of the head without contrast 12/23/2021: 1. No acute intracranial hemorrhage. 2. Patchy left occipital lobe peau attenuation, compatible with known area of infarction.  Risk Assessment/Calculations:   NA   Labs:  Latest Ref Rng & Units 08/10/2023    4:45 PM 07/19/2023   11:55 AM 03/29/2023   11:34 AM  CBC  WBC 3.4 - 10.8 x10E3/uL 5.9  5.4  8.0   Hemoglobin 11.1 - 15.9 g/dL 88.7  87.9  88.5    Hematocrit 34.0 - 46.6 % 36.9  41.3  40.1   Platelets 150 - 450 x10E3/uL 346  325  382        Latest Ref Rng & Units 11/08/2023   11:09 AM 08/10/2023    4:45 PM 07/19/2023   11:55 AM  BMP  Glucose 70 - 99 mg/dL 841  825  741   BUN 6 - 24 mg/dL 17  13  16    Creatinine 0.57 - 1.00 mg/dL 9.14  9.22  9.10   BUN/Creat Ratio 9 - 23 20  17  18    Sodium 134 - 144 mmol/L 139  134  137   Potassium 3.5 - 5.2 mmol/L 4.1  4.4  4.3   Chloride 96 - 106 mmol/L 103  103  101   CO2 20 - 29 mmol/L 23  21  17    Calcium  8.7 - 10.2 mg/dL 9.4  9.3  9.5       Latest Ref Rng & Units 11/08/2023   11:09 AM 08/10/2023    4:45 PM 07/19/2023   11:55 AM  CMP  Glucose 70 - 99 mg/dL 841  825  741   BUN 6 - 24 mg/dL 17  13  16    Creatinine 0.57 - 1.00 mg/dL 9.14  9.22  9.10   Sodium 134 - 144 mmol/L 139  134  137   Potassium 3.5 - 5.2 mmol/L 4.1  4.4  4.3   Chloride 96 - 106 mmol/L 103  103  101   CO2 20 - 29 mmol/L 23  21  17    Calcium  8.7 - 10.2 mg/dL 9.4  9.3  9.5   Total Protein 6.0 - 8.5 g/dL 7.2     Total Bilirubin 0.0 - 1.2 mg/dL 0.3     Alkaline Phos 44 - 121 IU/L 72     AST 0 - 40 IU/L 18     ALT 0 - 32 IU/L 17       Lab Results  Component Value Date   CHOL 137 11/08/2023   HDL 41 11/08/2023   LDLCALC 69 11/08/2023   LDLDIRECT 64 07/21/2022   TRIG 154 (H) 11/08/2023   CHOLHDL 6.0 12/21/2021   No results for input(s): LIPOA in the last 8760 hours.  No components found for: NTPROBNP No results for input(s): PROBNP in the last 8760 hours. No results for input(s): TSH in the last 8760 hours.   Physical Exam:    Today's Vitals   02/14/24 1008  BP: 112/78  Pulse: 81  Resp: 16  SpO2: 95%  Weight: 239 lb 9.6 oz (108.7 kg)  Height: 5' 4 (1.626 m)    Body mass index is 41.13 kg/m. Wt Readings from Last 3 Encounters:  02/14/24 239 lb 9.6 oz (108.7 kg)  01/24/24 242 lb (109.8 kg)  11/07/23 235 lb 6.4 oz (106.8 kg)    Physical Exam  Constitutional: No distress.  Age  appropriate, hemodynamically stable.   Neck: No JVD present.  Cardiovascular: Normal rate, regular rhythm, S1 normal, S2 normal, intact distal pulses and normal pulses. Exam reveals no gallop, no S3 and no S4.  No murmur heard. Pulmonary/Chest: Effort normal and breath sounds normal. No stridor. She  has no wheezes. She has no rales.  Abdominal: Soft. Bowel sounds are normal. She exhibits no distension. There is no abdominal tenderness.  Obese  Musculoskeletal:        General: No edema.     Cervical back: Neck supple.  Neurological: She is alert and oriented to person, place, and time. She has intact cranial nerves (2-12).  Skin: Skin is warm and moist.     Impression & Recommendation(s):  Impression:   ICD-10-CM   1. Atherosclerosis of native coronary artery of native heart without angina pectoris  I25.10     2. S/P PTCA (percutaneous transluminal coronary angioplasty)  Z98.61     3. Type 2 diabetes mellitus with hyperglycemia, with long-term current use of insulin  (HCC)  E11.65    Z79.4     4. Essential hypertension  I10     5. Dyslipidemia  E78.5     6. Cerebrovascular accident (CVA), unspecified mechanism (HCC)  I63.9     7. PFO with atrial septal aneurysm  Q21.12    I25.3     8. Former smoker  Z87.891     9. Class 3 severe obesity due to excess calories with serious comorbidity and body mass index (BMI) of 40.0 to 44.9 in adult  E66.813    Z68.41         Recommendation(s):  Atherosclerosis of native coronary artery of native heart without angina pectoris S/P PTCA (percutaneous transluminal coronary angioplasty) Denies anginal chest pain since last office visit. Continue aspirin  81 mg p.o. daily. Continue Lipitor 40 mg p.o. nightly. Continue Tricor  145 mg p.o. daily. Continue metoprolol  25 mg p.o. daily. Reemphasized importance of secondary prevention No additional testing warranted at this time.  Type 2 diabetes mellitus with hyperglycemia, with long-term  current use of insulin  (HCC) Most recent hemoglobin A1c 8.3% as of 11/07/2023. Reemphasized importance of better glycemic control. Her blood pressures are acceptable but if needed recommend ACE inhibitors or ARB's for renal protection. Continue statin therapy.  Essential hypertension Office blood pressure very well-controlled. Continue metoprolol   Dyslipidemia Most recent lipid profile from Nov 08, 2023 independently reviewed via [facility] database. LDL is 69 mg/dL, triglycerides 845 mg/dL Has gained weight since last office visit, likely contributory. Planning to undergo bariatric surgery later this month-recommend reevaluating lipids once back to baseline postsurgery.  Cerebrovascular accident (CVA), unspecified mechanism (HCC) PFO with atrial septal aneurysm During her stroke in 2023 she was noted to have a PFO at that time she did not pursue having the PFO closed due to insurance reasons. Underwent PFO closure February 2025.   Remains on aspirin  81 mg p.o. daily  Class 2 severe obesity due to excess calories with serious comorbidity and body mass index (BMI) of 40.0 to 44.9 in adult (HCC) Body mass index is 41.13 kg/m. I reviewed with her importance of diet, regular physical activity/exercise, weight loss.   Patient is educated on the importance of increasing physical activity gradually as tolerated with a goal of moderate intensity exercise for 30 minutes a day 5 days a week.  Orders Placed:  No orders of the defined types were placed in this encounter.   Final Medication List:   No orders of the defined types were placed in this encounter.   Medications Discontinued During This Encounter  Medication Reason   prednisoLONE  acetate (PRED FORTE ) 1 % ophthalmic suspension No longer needed (for PRN medications)   nitrofurantoin , macrocrystal-monohydrate, (MACROBID ) 100 MG capsule Completed Course   metoCLOPramide  (REGLAN ) 10 MG tablet  No longer needed (for PRN medications)    linaclotide  (LINZESS ) 145 MCG CAPS capsule Dose change     Current Outpatient Medications:    Accu-Chek Softclix Lancets lancets, Use to check blood sugar 3 times daily., Disp: 100 each, Rfl: 6   aspirin  81 MG chewable tablet, Chew 1 tablet (81 mg total) by mouth daily., Disp: 90 tablet, Rfl: 1   blood glucose meter kit and supplies KIT, Use up to four times daily as directed., Disp: 1 each, Rfl: 11   Blood Glucose Monitoring Suppl (ACCU-CHEK GUIDE) w/Device KIT, Use to check blood sugar 3 times daily., Disp: 1 kit, Rfl: 0   Continuous Glucose Sensor (FREESTYLE LIBRE 3 SENSOR) MISC, USE AS DIRECTED, Disp: 3 each, Rfl: 11   empagliflozin  (JARDIANCE ) 25 MG TABS tablet, Take 1 tablet (25 mg total) by mouth daily before breakfast., Disp: 90 tablet, Rfl: 1   fenofibrate  (TRICOR ) 145 MG tablet, Take 1 tablet (145 mg total) by mouth daily., Disp: 30 tablet, Rfl: 6   gabapentin  (NEURONTIN ) 300 MG capsule, Take 1 capsule (300 mg total) by mouth 3 (three) times daily., Disp: 270 capsule, Rfl: 2   glipiZIDE  (GLUCOTROL ) 10 MG tablet, Take 1 tablet (10 mg total) by mouth 2 (two) times daily., Disp: 180 tablet, Rfl: 1   glucose blood (ACCU-CHEK GUIDE TEST) test strip, Use to check blood sugar 3 times daily., Disp: 100 each, Rfl: 6   Insulin  Glargine (BASAGLAR  KWIKPEN) 100 UNIT/ML, Inject 60 Units into the skin at bedtime., Disp: 60 mL, Rfl: 1   insulin  lispro (HUMALOG  KWIKPEN) 100 UNIT/ML KwikPen, Inject according to sliding scale MDD 30 units Subcutaneous three times daily OK to sub Novolog  30 days, Disp: 9 mL, Rfl: 5   latanoprost  (XALATAN ) 0.005 % ophthalmic solution, Place 1 drop into both eyes every evening., Disp: 5 mL, Rfl: 11   linaclotide  (LINZESS ) 290 MCG CAPS capsule, Take 1 capsule (290 mcg total) by mouth daily before breakfast., Disp: 90 capsule, Rfl: 1   loratadine  (CLARITIN ) 10 MG tablet, Take 1 tablet (10 mg total) by mouth daily as needed for allergies., Disp: , Rfl:    metFORMIN  (GLUCOPHAGE )  500 MG tablet, Take 2 tablets (1,000 mg total) by mouth 2 (two) times daily with a meal., Disp: 360 tablet, Rfl: 1   metoprolol  succinate (TOPROL  XL) 25 MG 24 hr tablet, Take 1 tablet (25 mg total) by mouth daily., Disp: 90 tablet, Rfl: 1   pantoprazole  (PROTONIX ) 40 MG tablet, Take 1 tablet (40 mg total) by mouth daily before breakfast. (Stop taking omeprazole )., Disp: 90 tablet, Rfl: 1   solifenacin  (VESICARE ) 5 MG tablet, Take 1 tablet (5 mg total) by mouth daily., Disp: 90 tablet, Rfl: 1   tirzepatide  (MOUNJARO ) 10 MG/0.5ML Pen, Inject 10 mg into the skin once a week., Disp: 2 mL, Rfl: 5   tiZANidine  (ZANAFLEX ) 4 MG tablet, Take 1 tablet (4 mg total) by mouth every 8 (eight) hours as needed for muscle spasms., Disp: 60 tablet, Rfl: 0   atorvastatin  (LIPITOR) 40 MG tablet, Take 1 tablet (40 mg total) by mouth at bedtime., Disp: 90 tablet, Rfl: 3   azaTHIOprine  (IMURAN ) 50 MG tablet, Take 1 tablet (50 mg total) by mouth daily., Disp: 90 tablet, Rfl: 1   traMADol  (ULTRAM ) 50 MG tablet, Take 1 tablet (50 mg total) by mouth every 6 (six) hours as needed., Disp: 10 tablet, Rfl: 0  Consent:   N/A  Disposition:   1 year follow-up sooner if needed  Her questions and concerns were addressed to her satisfaction. She voices understanding of the recommendations provided during this encounter.    Signed, Julie Michele HAS, [facility]  A Division of [facility] [address] [place], [place] [address]  [place], [place] [phone]  02/14/2024 10:30 AM

## 2024-02-14 NOTE — Telephone Encounter (Signed)
 Copied from CRM [id number]. Topic: Clinical - Prescription Issue >> Feb 14, 2024  2:46 PM Sasha H wrote: Reason for CRM: Pt wants to know if she will be put back on Lorsartan 20mg  , states the pharmacy discontinued it and her blood pressures have been low at other appointments and she doesn't know why. And she also has questions about a medication that she was in for her stomach

## 2024-02-14 NOTE — Patient Instructions (Signed)
 Medication Instructions:  No medication changes were made at this visit. Continue current regimen.   *If you need a refill on your cardiac medications before your next appointment, please call your pharmacy*  Lab Work: None ordered today. If you have labs (blood work) drawn today and your tests are completely normal, you will receive your results only by: MyChart Message (if you have MyChart) OR A paper copy in the mail If you have any lab test that is abnormal or we need to change your treatment, we will call you to review the results.  Testing/Procedures: None ordered today.  Follow-Up: At [facility], you and your health needs are our priority.  As part of our continuing mission to provide you with exceptional heart care, our providers are all part of one team.  This team includes your primary Cardiologist (physician) and Advanced Practice Providers or APPs (Physician Assistants and Nurse Practitioners) who all work together to provide you with the care you need, when you need it.  Your next appointment:   1 year(s)  Provider:   Madonna Large, DO

## 2024-02-15 ENCOUNTER — Telehealth: Payer: Self-pay

## 2024-02-15 NOTE — Telephone Encounter (Signed)
 Copied from CRM [id number]. Topic: Referral - Question >> Feb 15, 2024  3:39 PM [name] wrote: Reason for CRM: pt called and stated that when she called [facility] for her colon screen referral, they told her that a referral has to be sent to that location. She was previously established with Dr. Rosalynn Pottier in [place]. Please advise. Julie Robertson was assisted with an interpreter ID# [phone])

## 2024-02-15 NOTE — Telephone Encounter (Signed)
 Patient was called and informed that losartan  has been discontinued she is requesting refill on Protonix  medication. I informed her that she has refills on medication and she needs to reach out to pharmacy.

## 2024-02-16 NOTE — Telephone Encounter (Signed)
 Can we change referral to location listed below.

## 2024-02-19 ENCOUNTER — Other Ambulatory Visit: Payer: Self-pay

## 2024-02-19 MED ORDER — METOCLOPRAMIDE HCL 10 MG PO TABS
ORAL_TABLET | ORAL | 3 refills | Status: DC
  Filled 2024-02-19: qty 30, 8d supply, fill #0
  Filled 2024-04-03: qty 30, 8d supply, fill #1
  Filled 2024-04-19: qty 30, 8d supply, fill #2
  Filled 2024-05-13: qty 30, 8d supply, fill #3

## 2024-02-19 MED ORDER — LEVOFLOXACIN 500 MG PO TABS
ORAL_TABLET | ORAL | 0 refills | Status: AC
  Filled 2024-02-19: qty 1, 1d supply, fill #0

## 2024-02-19 MED ORDER — APREPITANT 40 MG PO CAPS
ORAL_CAPSULE | ORAL | 0 refills | Status: AC
  Filled 2024-02-19: qty 1, 1d supply, fill #0

## 2024-02-19 MED ORDER — ONDANSETRON 8 MG PO TBDP
8.0000 mg | ORAL_TABLET | Freq: Three times a day (TID) | ORAL | 1 refills | Status: AC | PRN
  Filled 2024-02-19: qty 30, 10d supply, fill #0

## 2024-02-19 NOTE — Telephone Encounter (Signed)
 LVM for return call.

## 2024-02-20 ENCOUNTER — Other Ambulatory Visit: Payer: Self-pay

## 2024-02-21 ENCOUNTER — Inpatient Hospital Stay
Admission: RE | Admit: 2024-02-21 | Discharge: 2024-02-21 | Discharge status: Home / Self Care | Source: Ambulatory Visit | Attending: Family Medicine | Admitting: Family Medicine

## 2024-02-21 ENCOUNTER — Other Ambulatory Visit: Payer: Self-pay

## 2024-02-21 ENCOUNTER — Other Ambulatory Visit: Payer: Self-pay | Admitting: Family Medicine

## 2024-02-21 ENCOUNTER — Telehealth: Payer: Self-pay

## 2024-02-21 DIAGNOSIS — N6489 Other specified disorders of breast: Secondary | ICD-10-CM

## 2024-02-21 DIAGNOSIS — R928 Other abnormal and inconclusive findings on diagnostic imaging of breast: Secondary | ICD-10-CM

## 2024-02-21 DIAGNOSIS — M25512 Pain in left shoulder: Secondary | ICD-10-CM

## 2024-02-21 MED ORDER — TIZANIDINE HCL 4 MG PO TABS
4.0000 mg | ORAL_TABLET | Freq: Three times a day (TID) | ORAL | 0 refills | Status: DC | PRN
  Filled 2024-02-21: qty 60, 20d supply, fill #0

## 2024-02-21 NOTE — Telephone Encounter (Signed)
 Patient came in stating she is aware of the referral to Dr.Neal Frutoso but she is requesting to get it switched once again if possible to his office located in [place] [address] [phone].

## 2024-02-21 NOTE — Telephone Encounter (Signed)
 Can we change the location of the referral.

## 2024-02-21 NOTE — Telephone Encounter (Signed)
 Patient is requesting refill on tiZANidine  (ZANAFLEX ) 4 MG tablet

## 2024-02-21 NOTE — Telephone Encounter (Signed)
Medication has been filled 

## 2024-02-22 ENCOUNTER — Ambulatory Visit: Payer: Self-pay | Admitting: Family Medicine

## 2024-02-23 NOTE — Telephone Encounter (Signed)
 Can you reach out to patient and inform her of referral being placed.

## 2024-02-27 ENCOUNTER — Other Ambulatory Visit: Payer: Self-pay

## 2024-02-28 ENCOUNTER — Ambulatory Visit: Admitting: Family Medicine

## 2024-03-05 ENCOUNTER — Other Ambulatory Visit: Payer: Self-pay

## 2024-03-06 ENCOUNTER — Other Ambulatory Visit: Payer: Self-pay

## 2024-03-07 ENCOUNTER — Other Ambulatory Visit: Payer: Self-pay

## 2024-03-07 MED ORDER — NA SULFATE-K SULFATE-MG SULF 17.5-3.13-1.6 GM/177ML PO SOLN
ORAL | 0 refills | Status: AC
  Filled 2024-03-07: qty 354, 2d supply, fill #0

## 2024-03-13 ENCOUNTER — Other Ambulatory Visit: Payer: Self-pay

## 2024-03-13 ENCOUNTER — Ambulatory Visit: Payer: Self-pay

## 2024-03-13 NOTE — Telephone Encounter (Signed)
 FYI Only or Action Required?: FYI only for provider.  Patient was last seen in primary care on 01/24/2024 by Newlin, Enobong, MD.  Called Nurse Triage reporting Advice Only.  Symptoms began today.  Interventions attempted: Nothing.  Symptoms are: stable.  Triage Disposition: No disposition on file.  Patient/caregiver understands and will follow disposition?:   Letter was read to patient and interpreter.  Denies questions and states will f/u within time frame for next mammogram.   Copied from CRM #[id number]. Topic: Clinical - Lab/Test Results >> Mar 13, 2024 11:58 AM Donna BRAVO wrote: Reason for CRM: Spanish interpreter ID [phone] patient received letter in August regarding mammogram and lost the letter, patient is asking about the letter and what the results of the mammogram CAL informed me to transfer to Nurse triage, who can read results Reason for Disposition  Health information question, no triage required and triager able to answer question  Answer Assessment - Initial Assessment Questions 1. REASON FOR CALL: What is the main reason for your call? or How can I best help you?     States received a letter for a mammogram and lost it.  She doesn't remember what it said.  Protocols used: Information Only Call - No Triage-A-AH

## 2024-03-14 ENCOUNTER — Other Ambulatory Visit: Payer: Self-pay

## 2024-03-14 NOTE — Telephone Encounter (Signed)
 Noted

## 2024-03-20 ENCOUNTER — Ambulatory Visit: Admitting: Family Medicine

## 2024-04-03 ENCOUNTER — Other Ambulatory Visit (HOSPITAL_COMMUNITY): Payer: Self-pay

## 2024-04-03 ENCOUNTER — Other Ambulatory Visit: Payer: Self-pay

## 2024-04-03 ENCOUNTER — Ambulatory Visit: Attending: Family Medicine | Admitting: Family Medicine

## 2024-04-03 ENCOUNTER — Encounter: Payer: Self-pay | Admitting: Family Medicine

## 2024-04-03 VITALS — BP 102/69 | HR 81 | Ht 64.0 in | Wt 234.6 lb

## 2024-04-03 DIAGNOSIS — Z79899 Other long term (current) drug therapy: Secondary | ICD-10-CM

## 2024-04-03 DIAGNOSIS — Z794 Long term (current) use of insulin: Secondary | ICD-10-CM

## 2024-04-03 DIAGNOSIS — Z7984 Long term (current) use of oral hypoglycemic drugs: Secondary | ICD-10-CM

## 2024-04-03 DIAGNOSIS — Z23 Encounter for immunization: Secondary | ICD-10-CM

## 2024-04-03 DIAGNOSIS — K219 Gastro-esophageal reflux disease without esophagitis: Secondary | ICD-10-CM

## 2024-04-03 DIAGNOSIS — Z8673 Personal history of transient ischemic attack (TIA), and cerebral infarction without residual deficits: Secondary | ICD-10-CM

## 2024-04-03 DIAGNOSIS — K5909 Other constipation: Secondary | ICD-10-CM

## 2024-04-03 DIAGNOSIS — Z87891 Personal history of nicotine dependence: Secondary | ICD-10-CM

## 2024-04-03 DIAGNOSIS — E1169 Type 2 diabetes mellitus with other specified complication: Secondary | ICD-10-CM | POA: Diagnosis not present

## 2024-04-03 DIAGNOSIS — I2510 Atherosclerotic heart disease of native coronary artery without angina pectoris: Secondary | ICD-10-CM | POA: Diagnosis not present

## 2024-04-03 DIAGNOSIS — Z7982 Long term (current) use of aspirin: Secondary | ICD-10-CM

## 2024-04-03 LAB — POCT GLYCOSYLATED HEMOGLOBIN (HGB A1C): HbA1c, POC (controlled diabetic range): 6.4 % (ref 0.0–7.0)

## 2024-04-03 MED ORDER — TIRZEPATIDE 12.5 MG/0.5ML ~~LOC~~ SOAJ
12.5000 mg | SUBCUTANEOUS | 0 refills | Status: AC
  Filled 2024-04-03 (×2): qty 2, 28d supply, fill #0

## 2024-04-03 MED ORDER — GLIPIZIDE 10 MG PO TABS
10.0000 mg | ORAL_TABLET | Freq: Two times a day (BID) | ORAL | 1 refills | Status: AC
  Filled 2024-04-03 – 2024-06-07 (×2): qty 180, 90d supply, fill #0

## 2024-04-03 MED ORDER — TIRZEPATIDE 15 MG/0.5ML ~~LOC~~ SOAJ
15.0000 mg | SUBCUTANEOUS | 1 refills | Status: AC
  Filled 2024-04-03 (×2): qty 6, 84d supply, fill #0

## 2024-04-03 MED ORDER — EMPAGLIFLOZIN 25 MG PO TABS
25.0000 mg | ORAL_TABLET | Freq: Every day | ORAL | 1 refills | Status: AC
  Filled 2024-04-03 – 2024-05-07 (×2): qty 90, 90d supply, fill #0
  Filled 2024-08-08: qty 90, 90d supply, fill #1

## 2024-04-03 MED ORDER — LINACLOTIDE 290 MCG PO CAPS
290.0000 ug | ORAL_CAPSULE | Freq: Every day | ORAL | 1 refills | Status: AC
  Filled 2024-07-16: qty 90, 90d supply, fill #0

## 2024-04-03 MED ORDER — METOPROLOL SUCCINATE ER 25 MG PO TB24
25.0000 mg | ORAL_TABLET | Freq: Every day | ORAL | 1 refills | Status: AC
  Filled 2024-04-03 – 2024-05-27 (×2): qty 90, 90d supply, fill #0
  Filled 2024-08-08: qty 90, 90d supply, fill #1

## 2024-04-03 MED ORDER — METFORMIN HCL 500 MG PO TABS
1000.0000 mg | ORAL_TABLET | Freq: Two times a day (BID) | ORAL | 1 refills | Status: AC
  Filled 2024-06-17: qty 360, 90d supply, fill #0

## 2024-04-03 MED ORDER — PANTOPRAZOLE SODIUM 40 MG PO TBEC
40.0000 mg | DELAYED_RELEASE_TABLET | Freq: Every day | ORAL | 1 refills | Status: AC
  Filled 2024-06-17: qty 90, 90d supply, fill #0

## 2024-04-03 MED ORDER — BASAGLAR KWIKPEN 100 UNIT/ML ~~LOC~~ SOPN
25.0000 [IU] | PEN_INJECTOR | Freq: Every evening | SUBCUTANEOUS | 5 refills | Status: AC
  Filled 2024-04-03: qty 6, 24d supply, fill #0
  Filled 2024-04-03: qty 60, 240d supply, fill #0
  Filled 2024-05-07: qty 6, 24d supply, fill #1
  Filled 2024-05-28: qty 6, 24d supply, fill #2
  Filled 2024-06-17: qty 6, 24d supply, fill #3
  Filled 2024-07-02 – 2024-07-16 (×2): qty 6, 24d supply, fill #4
  Filled 2024-08-08: qty 6, 24d supply, fill #5

## 2024-04-03 NOTE — Progress Notes (Signed)
 Subjective:  Patient ID: Julie Robertson, female    DOB: 1976-06-07  Age: 48 y.o. MRN: [id number]  CC: Medical Management of Chronic Issues     Discussed the use of AI scribe software for clinical note transcription with the patient, who gave verbal consent to proceed.  History of Present Illness Julie Robertson is a 48 year old female with a history of left occipital lobe stroke in 12/2021, PFO, type 2 diabetes mellitus, dyslipidemia, GERD, OSA (on CPAP) nicotine  dependence and autoimmune hepatitis (diagnosed after a liver biopsy)  who presents for follow-up of her diabetes management.  Her A1c has improved to 6.4 from 8.3. She is currently on Mounjaro  10 mg weekly and Lantus  30 units nightly. Her insulin  dosage was previously adjusted due to elevated blood glucose levels while on steroids for her liver condition. She was seen by [facility] for an eye exam 1 year ago, with plans to follow up with another specialist for a comprehensive eye exam but she states she never received the address of the new ophthalmology she needed to follow-up with.  She has a history of autoimmune hepatitis and is under the care of a gastroenterologist. She takes azathioprine  for her liver condition prescribed by another physician.  She experiences occasional forgetfulness following a stroke but eventually recalls information.  She underwent colonoscopy but due to poor bowel prep was recommended to repeat this in 1 year.  A previous attempt at bariatric surgery with [facility] was not completed due to abdominal adhesions making proceeding with elective surgery unsafe.  She completed a mammogram and breast ultrasound in August which revealed probable benign 8 mm mass in the right breast with recommendation to follow-up in 6 months.     Past Medical History:  Diagnosis Date   Anxiety    Asthma    Coronary artery disease    CVA (cerebral vascular accident) (HCC)     Diabetes mellitus without complication (HCC)    Diabetic neuropathy (HCC) 12/2013   vs carpal tunnell.  numbness tingling in right fingers. rx with Gabapentin .   Dyslipidemia 08/2009   Dyspnea    Gall stones 08/2009   GERD (gastroesophageal reflux disease)    H/O tubal ligation    Obesity    BMI 41, 250# 03/2015    Past Surgical History:  Procedure Laterality Date   APPLICATION OF WOUND VAC N/A 05/09/2019   Procedure: APPLICATION OF WOUND VAC;  Surgeon: Desiderio Schanz, MD;  Location: [facility] ORS;  Service: General;  Laterality: N/A;  [id number]   BUBBLE STUDY  12/24/2021   Procedure: BUBBLE STUDY;  Surgeon: Pietro Redell RAMAN, MD;  Location: [facility] ENDOSCOPY;  Service: Cardiovascular;;   CHOLECYSTECTOMY N/A 03/10/2015   Procedure: LAPAROSCOPIC CHOLECYSTECTOMY WITH INTRAOPERATIVE CHOLANGIOGRAM;  Surgeon: Dann Hummer, MD;  Location: [facility] OR;  Service: General;  Laterality: N/A;   CORONARY BALLOON ANGIOPLASTY N/A 08/09/2022   Procedure: CORONARY BALLOON ANGIOPLASTY;  Surgeon: Ladona Heinz, MD;  Location: [facility] INVASIVE CV LAB;  Service: Cardiovascular;  Laterality: N/A;   ERCP N/A 03/11/2015   Procedure: ENDOSCOPIC RETROGRADE CHOLANGIOPANCREATOGRAPHY (ERCP);  Surgeon: Toribio SHAUNNA Cedar, MD;  Location: [facility] ENDOSCOPY;  Service: Endoscopy;  Laterality: N/A;   ESOPHAGOGASTRODUODENOSCOPY (EGD) WITH PROPOFOL  N/A 07/25/2018   Procedure: ESOPHAGOGASTRODUODENOSCOPY (EGD) WITH PROPOFOL ;  Surgeon: Wilhelmenia Aloha Raddle., MD;  Location: [facility] ENDOSCOPY;  Service: Gastroenterology;  Laterality: N/A;   INSERTION OF MESH N/A 05/09/2019   Procedure: INSERTION OF MESH;  Surgeon: Desiderio Schanz, MD;  Location: [facility] ORS;  Service: General;  Laterality: N/A;   LEFT HEART CATH AND CORONARY ANGIOGRAPHY N/A 08/09/2022   Procedure: LEFT HEART CATH AND CORONARY ANGIOGRAPHY;  Surgeon: Ladona Heinz, MD;  Location: [facility] INVASIVE CV LAB;  Service: Cardiovascular;  Laterality: N/A;   PATENT FORAMEN OVALE(PFO) CLOSURE N/A 07/27/2023   Procedure: PATENT  FORAMEN OVALE(PFO) CLOSURE;  Surgeon: Ladona Heinz, MD;  Location: [facility] INVASIVE CV LAB;  Service: Cardiovascular;  Laterality: N/A;   PATENT FORAMEN OVALE(PFO) CLOSURE N/A 08/14/2023   Procedure: PATENT FORAMEN OVALE(PFO) CLOSURE;  Surgeon: Ladona Heinz, MD;  Location: [facility] INVASIVE CV LAB;  Service: Cardiovascular;  Laterality: N/A;   TEE WITHOUT CARDIOVERSION N/A 12/24/2021   Procedure: TRANSESOPHAGEAL ECHOCARDIOGRAM (TEE);  Surgeon: Pietro Redell RAMAN, MD;  Location: [facility] ENDOSCOPY;  Service: Cardiovascular;  Laterality: N/A;   Transduodenal Ampullectomy  09/2015   TUBAL LIGATION     VENTRAL HERNIA REPAIR N/A 05/09/2019   Procedure: HERNIA REPAIR VENTRAL ADULT with MESH;  Surgeon: Desiderio Schanz, MD;  Location: [facility] ORS;  Service: General;  Laterality: N/A;    Family History  Adopted: Yes  Problem Relation Age of Onset   Diabetes Mother    Heart disease Mother    Hypertension Mother    Heart attack Father    Diabetes Father    Kidney disease Maternal Uncle    Bone cancer Maternal Grandfather    Other Son        had kidney removed due to gun shot wound    Social History   Socioeconomic History   Marital status: Married    Spouse name: Not on file   Number of children: 2   Years of education: Not on file   Highest education level: Not on file  Occupational History   Occupation: house cleaning  Tobacco Use   Smoking status: Former    Current packs/day: 1.50    Average packs/day: 1.5 packs/day for 4.0 years (6.0 ttl pk-yrs)    Types: Cigarettes    Passive exposure: Never   Smokeless tobacco: Never  Vaping Use   Vaping status: Never Used  Substance and Sexual Activity   Alcohol  use: Yes    Comment: rare   Drug use: No   Sexual activity: Not Currently  Other Topics Concern   Not on file  Social History Narrative   Patient has 2 sons one is 66, the other 56 as of 03/2015. Her kids are not the children of her current husband. As of 03/2015 she is not employed outside the home.    Social Drivers of Corporate investment banker Strain: Low Risk  (02/12/2024)   Received from [facility]   Overall Financial Resource Strain (CARDIA)    How hard is it for you to pay for the very basics like food, housing, medical care, and heating?: Not hard at all  Food Insecurity: No Food Insecurity (02/28/2024)   [facility]   Hunger Vital Sign    Within the past 12 months, you worried that your food would run out before you got the money to buy more.: Never true    Within the past 12 months, the food you bought just didn't last and you didn't have money to get more.: Never true  Transportation Needs: No Transportation Needs (02/28/2024)   Received from [facility] - Transportation    In the past 12 months, has lack of transportation kept you from medical appointments or from getting medications?: No    In the past  12 months, has lack of transportation kept you from meetings, work, or from getting things needed for daily living?: No  Physical Activity: Inactive (09/23/2022)   Exercise Vital Sign    Days of Exercise per Week: 0 days    Minutes of Exercise per Session: 0 min  Stress: No Stress Concern Present (02/28/2024)   Received from [facility] - Occupational Stress Questionnaire    Do you feel stress - tense, restless, nervous, or anxious, or unable to sleep at night because your mind is troubled all the time - these days?: Not at all  Social Connections: Moderately Isolated (08/14/2023)   Social Connection and Isolation Panel    Frequency of Communication with Friends and Family: More than three times a week    Frequency of Social Gatherings with Friends and Family: More than three times a week    Attends Religious Services: Never    Database administrator or Organizations: No    Attends Banker Meetings: Never    Marital Status: Married    No Known Allergies  Outpatient Medications  Prior to Visit  Medication Sig Dispense Refill   Accu-Chek Softclix Lancets lancets Use to check blood sugar 3 times daily. 100 each 6   aprepitant  (EMEND ) 40 MG capsule Take 1 capsule at 5am on the morning of surgery. 1 capsule 0   aspirin  81 MG chewable tablet Chew 1 tablet (81 mg total) by mouth daily. 90 tablet 1   atorvastatin  (LIPITOR) 40 MG tablet Take 1 tablet (40 mg total) by mouth at bedtime. 90 tablet 3   azaTHIOprine  (IMURAN ) 50 MG tablet Take 1 tablet (50 mg total) by mouth daily. 90 tablet 1   blood glucose meter kit and supplies KIT Use up to four times daily as directed. 1 each 11   Blood Glucose Monitoring Suppl (ACCU-CHEK GUIDE) w/Device KIT Use to check blood sugar 3 times daily. 1 kit 0   Continuous Glucose Sensor (FREESTYLE LIBRE 3 SENSOR) MISC USE AS DIRECTED 3 each 11   fenofibrate  (TRICOR ) 145 MG tablet Take 1 tablet (145 mg total) by mouth daily. 30 tablet 6   gabapentin  (NEURONTIN ) 300 MG capsule Take 1 capsule (300 mg total) by mouth 3 (three) times daily. 270 capsule 2   glucose blood (ACCU-CHEK GUIDE TEST) test strip Use to check blood sugar 3 times daily. 100 each 6   insulin  lispro (HUMALOG  KWIKPEN) 100 UNIT/ML KwikPen Inject according to sliding scale MDD 30 units Subcutaneous three times daily OK to sub Novolog  30 days 9 mL 5   latanoprost  (XALATAN ) 0.005 % ophthalmic solution Place 1 drop into both eyes every evening. 5 mL 11   levofloxacin  (LEVAQUIN ) 500 MG tablet Take one tablet by mouth the night before surgery between 9pm and 11pm 1 tablet 0   loratadine  (CLARITIN ) 10 MG tablet Take 1 tablet (10 mg total) by mouth daily as needed for allergies.     metoCLOPramide  (REGLAN ) 10 MG tablet Take 1 tablet at 5am the morning of surgery and then every 6 to 8 hours as needed for nausea. 30 tablet 3   Na Sulfate-K Sulfate-Mg Sulfate concentrate (SUPREP) 17.5-3.13-1.6 GM/177ML SOLN Take 177 mLs by mouth 2 (two) times. 354 mL 0   ondansetron  (ZOFRAN -ODT) 8 MG  disintegrating tablet Take one tablet (8 mg dose) by mouth every 8 (eight) hours as needed for Nausea for up to 7 days. 30 tablet 1   solifenacin  (VESICARE ) 5  MG tablet Take 1 tablet (5 mg total) by mouth daily. 90 tablet 1   tirzepatide  (MOUNJARO ) 10 MG/0.5ML Pen Inject 10 mg into the skin once a week. 2 mL 5   tiZANidine  (ZANAFLEX ) 4 MG tablet Take 1 tablet (4 mg total) by mouth every 8 (eight) hours as needed for muscle spasms. 60 tablet 0   traMADol  (ULTRAM ) 50 MG tablet Take 1 tablet (50 mg total) by mouth every 6 (six) hours as needed. 10 tablet 0   empagliflozin  (JARDIANCE ) 25 MG TABS tablet Take 1 tablet (25 mg total) by mouth daily before breakfast. 90 tablet 1   glipiZIDE  (GLUCOTROL ) 10 MG tablet Take 1 tablet (10 mg total) by mouth 2 (two) times daily. 180 tablet 1   Insulin  Glargine (BASAGLAR  KWIKPEN) 100 UNIT/ML Inject 60 Units into the skin at bedtime. 60 mL 1   linaclotide  (LINZESS ) 290 MCG CAPS capsule Take 1 capsule (290 mcg total) by mouth daily before breakfast. 90 capsule 1   metFORMIN  (GLUCOPHAGE ) 500 MG tablet Take 2 tablets (1,000 mg total) by mouth 2 (two) times daily with a meal. 360 tablet 1   metoprolol  succinate (TOPROL  XL) 25 MG 24 hr tablet Take 1 tablet (25 mg total) by mouth daily. 90 tablet 1   pantoprazole  (PROTONIX ) 40 MG tablet Take 1 tablet (40 mg total) by mouth daily before breakfast. (Stop taking omeprazole ). 90 tablet 1   No facility-administered medications prior to visit.     ROS Review of Systems  Constitutional:  Negative for activity change and appetite change.  HENT:  Negative for sinus pressure and sore throat.   Respiratory:  Negative for chest tightness, shortness of breath and wheezing.   Cardiovascular:  Negative for chest pain and palpitations.  Gastrointestinal:  Negative for abdominal distention, abdominal pain and constipation.  Genitourinary: Negative.   Musculoskeletal: Negative.   Psychiatric/Behavioral:  Negative for behavioral  problems and dysphoric mood.     Objective:  BP 102/69   Pulse 81   Ht 5' 4 (1.626 m)   Wt 234 lb 9.6 oz (106.4 kg)   SpO2 97%   BMI 40.27 kg/m      04/03/2024    2:31 PM 02/14/2024   10:08 AM 01/24/2024    4:03 PM  BP/Weight  Systolic BP 102 112 117  Diastolic BP 69 78 63  Wt. (Lbs) 234.6 239.6 242  BMI 40.27 kg/m2 41.13 kg/m2 41.54 kg/m2      Physical Exam Constitutional:      Appearance: She is well-developed.  Cardiovascular:     Rate and Rhythm: Normal rate.     Heart sounds: Normal heart sounds. No murmur heard. Pulmonary:     Effort: Pulmonary effort is normal.     Breath sounds: Normal breath sounds. No wheezing or rales.  Chest:     Chest wall: No tenderness.  Abdominal:     General: Bowel sounds are normal. There is no distension.     Palpations: Abdomen is soft. There is no mass.     Tenderness: There is no abdominal tenderness.  Musculoskeletal:        General: Normal range of motion.     Right lower leg: No edema.     Left lower leg: No edema.  Neurological:     Mental Status: She is alert and oriented to person, place, and time.  Psychiatric:        Mood and Affect: Mood normal.     Diabetic Foot Exam - Simple  Simple Foot Form Diabetic Foot exam was performed with the following findings: Yes 04/03/2024  4:17 PM  Visual Inspection No deformities, no ulcerations, no other skin breakdown bilaterally: Yes Sensation Testing Intact to touch and monofilament testing bilaterally: Yes Pulse Check Posterior Tibialis and Dorsalis pulse intact bilaterally: Yes Comments        Latest Ref Rng & Units 11/08/2023   11:09 AM 08/10/2023    4:45 PM 07/19/2023   11:55 AM  CMP  Glucose 70 - 99 mg/dL 841  825  741   BUN 6 - 24 mg/dL 17  13  16    Creatinine 0.57 - 1.00 mg/dL 9.14  9.22  9.10   Sodium 134 - 144 mmol/L 139  134  137   Potassium 3.5 - 5.2 mmol/L 4.1  4.4  4.3   Chloride 96 - 106 mmol/L 103  103  101   CO2 20 - 29 mmol/L 23  21  17    Calcium   8.7 - 10.2 mg/dL 9.4  9.3  9.5   Total Protein 6.0 - 8.5 g/dL 7.2     Total Bilirubin 0.0 - 1.2 mg/dL 0.3     Alkaline Phos 44 - 121 IU/L 72     AST 0 - 40 IU/L 18     ALT 0 - 32 IU/L 17       Lipid Panel     Component Value Date/Time   CHOL 137 11/08/2023 1109   TRIG 154 (H) 11/08/2023 1109   HDL 41 11/08/2023 1109   CHOLHDL 6.0 12/21/2021 0925   VLDL 45 (H) 12/21/2021 0925   LDLCALC 69 11/08/2023 1109   LDLDIRECT 64 07/21/2022 1010    CBC    Component Value Date/Time   WBC 5.9 08/10/2023 1645   WBC 5.4 08/10/2022 0549   RBC 4.73 08/10/2023 1645   RBC 4.50 08/10/2022 0549   HGB 11.2 08/10/2023 1645   HCT 36.9 08/10/2023 1645   PLT 346 08/10/2023 1645   MCV 78 (L) 08/10/2023 1645   MCH 23.7 (L) 08/10/2023 1645   MCH 21.3 (L) 08/10/2022 0549   MCHC 30.4 (L) 08/10/2023 1645   MCHC 30.0 08/10/2022 0549   RDW 14.0 08/10/2023 1645   LYMPHSABS 2.2 03/29/2023 1134   MONOABS 0.5 12/20/2021 1616   EOSABS 0.3 03/29/2023 1134   BASOSABS 0.0 03/29/2023 1134    Lab Results  Component Value Date   HGBA1C 6.4 04/03/2024    Lab Results  Component Value Date   HGBA1C 6.4 04/03/2024    Lab Results  Component Value Date   HGBA1C 6.4 04/03/2024   HGBA1C 8.3 (A) 11/07/2023   HGBA1C 6.4 03/21/2023          Assessment & Plan Type 2 diabetes mellitus with insulin  use Hemoglobin A1c improved to 6.4. Adjusting medication due to desire for additional weight loss given she is unable to undergo bariatric surgery - Increase Mounjaro  to 12.5 mg weekly for four weeks, then to 15 mg. - Decrease Lantus  to 25 units with Mounjaro  increase to 12.5 mg. - Reduce Lantus  by 5 units with each Mounjaro  increase. - Check kidney and liver function. - Schedule follow-up in six months with fasting for cholesterol check. - Will provide the number to [facility] so she can contact them regarding ophthalmology follow-up  Autoimmune hepatitis Condition under control. Continues  management with GI specialist. - Continue management with GI doctor. - Monitor liver function.  GERD - Stable - Continue PPI   Atherosclerosis of  native coronary artery of native heart - Controlled on atorvastatin  Will check lipid panel at next visit   Chronic constipation - Continue Linzess     General Health Maintenance Incomplete colonoscopy, mammogram done, eye exam pending. - Provide contact for [facility]. - Administer flu shot.     Healthcare maintenance Up-to-date on mammogram, Pap smear. Colonoscopy requires repeat due to poor bowel prep  Meds ordered this encounter  Medications   empagliflozin  (JARDIANCE ) 25 MG TABS tablet    Sig: Take 1 tablet (25 mg total) by mouth daily before breakfast.    Dispense:  90 tablet    Refill:  1   glipiZIDE  (GLUCOTROL ) 10 MG tablet    Sig: Take 1 tablet (10 mg total) by mouth 2 (two) times daily.    Dispense:  180 tablet    Refill:  1   Insulin  Glargine (BASAGLAR  KWIKPEN) 100 UNIT/ML    Sig: Inject 25 Units into the skin at bedtime.    Dispense:  60 mL    Refill:  5   linaclotide  (LINZESS ) 290 MCG CAPS capsule    Sig: Take 1 capsule (290 mcg total) by mouth daily before breakfast.    Dispense:  90 capsule    Refill:  1    Dose increase   metFORMIN  (GLUCOPHAGE ) 500 MG tablet    Sig: Take 2 tablets (1,000 mg total) by mouth 2 (two) times daily with a meal.    Dispense:  360 tablet    Refill:  1   metoprolol  succinate (TOPROL  XL) 25 MG 24 hr tablet    Sig: Take 1 tablet (25 mg total) by mouth daily.    Dispense:  90 tablet    Refill:  1   pantoprazole  (PROTONIX ) 40 MG tablet    Sig: Take 1 tablet (40 mg total) by mouth daily before breakfast. (Stop taking omeprazole ).    Dispense:  90 tablet    Refill:  1   tirzepatide  (MOUNJARO ) 12.5 MG/0.5ML Pen    Sig: Inject 12.5 mg into the skin once a week. For 4 weeks then increase to 15mg     Dispense:  2 mL    Refill:  0    Discontinue 10mg    tirzepatide   (MOUNJARO ) 15 MG/0.5ML Pen    Sig: Inject 15 mg into the skin once a week.    Dispense:  6 mL    Refill:  1    Follow-up: Return in about 6 months (around 10/02/2024) for Chronic medical conditions.       Corrina Sabin, MD, FAAFP. [facility] [place], [place] [phone]   04/03/2024, 4:28 PM

## 2024-04-03 NOTE — Patient Instructions (Addendum)
 [facility]: [phone]  Influenza (Flu) Vaccine (Inactivated or Recombinant): What You Need to Know Many vaccine information statements are available in Spanish and other languages. See PromoAge.com.br. 1. Why get vaccinated? Influenza vaccine can prevent influenza (flu). Flu is a contagious disease that spreads around the [place]  every year, usually between October and May. Anyone can get the flu, but it is more dangerous for some people. Infants and young children, people 24 years and older, pregnant people, and people with certain health conditions or a weakened immune system are at greatest risk of flu complications. Pneumonia, bronchitis, sinus infections, and ear infections are examples of flu-related complications. If you have a medical condition, such as heart disease, cancer, or diabetes, flu can make it worse. Flu can cause fever and chills, sore throat, muscle aches, fatigue, cough, headache, and runny or stuffy nose. Some people may have vomiting and diarrhea, though this is more common in children than adults. In an average year, thousands of people in the [place]  die from flu, and many more are hospitalized. Flu vaccine prevents millions of illnesses and flu-related visits to the doctor each year. 2. Influenza vaccines CDC recommends everyone 6 months and older get vaccinated every flu season. Children 6 months through 57 years of age may need 2 doses during a single flu season. Everyone else needs only 1 dose each flu season. It takes about 2 weeks for protection to develop after vaccination. There are many flu viruses, and they are always changing. Each year a new flu vaccine is made to protect against the influenza viruses believed to be likely to cause disease in the upcoming flu season. Even when the vaccine doesn't exactly match these viruses, it may still provide some protection. Influenza vaccine does not cause flu. Influenza vaccine may be given  at the same time as other vaccines. 3. Talk with your health care provider Tell your vaccination provider if the person getting the vaccine: Has had an allergic reaction after a previous dose of influenza vaccine, or has any severe, life-threatening allergies Has ever had Guillain-Barr Syndrome (also called GBS) In some cases, your health care provider may decide to postpone influenza vaccination until a future visit. Influenza vaccine can be administered at any time during pregnancy. People who are or will be pregnant during influenza season should receive inactivated influenza vaccine. People with minor illnesses, such as a cold, may be vaccinated. People who are moderately or severely ill should usually wait until they recover before getting influenza vaccine. Your health care provider can give you more information. 4. Risks of a vaccine reaction Soreness, redness, and swelling where the shot is given, fever, muscle aches, and headache can happen after influenza vaccination. There may be a very small increased risk of Guillain-Barr Syndrome (GBS) after inactivated influenza vaccine (the flu shot). Young children who get the flu shot along with pneumococcal vaccine (PCV13) and/or DTaP vaccine at the same time might be slightly more likely to have a seizure caused by fever. Tell your health care provider if a child who is getting flu vaccine has ever had a seizure. People sometimes faint after medical procedures, including vaccination. Tell your provider if you feel dizzy or have vision changes or ringing in the ears. As with any medicine, there is a very remote chance of a vaccine causing a severe allergic reaction, other serious injury, or death. 5. What if there is a serious problem? An allergic reaction could occur after the vaccinated person leaves the  clinic. If you see signs of a severe allergic reaction (hives, swelling of the face and throat, difficulty breathing, a fast heartbeat,  dizziness, or weakness), call 9-1-1 and get the person to the nearest hospital. For other signs that concern you, call your health care provider. Adverse reactions should be reported to the Vaccine Adverse Event Reporting System (VAERS). Your health care provider will usually file this report, or you can do it yourself. Visit the VAERS website at www.vaers.LAgents.no or call (812)592-4036. VAERS is only for reporting reactions, and VAERS staff members do not give medical advice. 6. The National Vaccine Injury Compensation Program The [employer] Vaccine Injury Compensation Program (VICP) is a federal program that was created to compensate people who may have been injured by certain vaccines. Claims regarding alleged injury or death due to vaccination have a time limit for filing, which may be as short as two years. Visit the VICP website at SpiritualWord.at or call 504-314-9840 to learn about the program and about filing a claim. 7. How can I learn more? Ask your health care provider. Call your local or state health department. Visit the website of the Food and Drug Administration (FDA) for vaccine package inserts and additional information at FinderList.no. Contact the Centers for Disease Control and Prevention (CDC): Call 623-310-4668 (1-800-CDC-INFO) or Visit CDC's website at BiotechRoom.com.cy. Source: CDC Vaccine Information Statement Inactivated Influenza Vaccine (02/07/2020) This same material is available at FootballExhibition.com.br for no charge. This information is not intended to replace advice given to you by your health care provider. Make sure you discuss any questions you have with your health care provider. Document Revised: 10/05/2022 Document Reviewed: 07/11/2022 Elsevier Patient Education  2024 [employer].

## 2024-04-04 ENCOUNTER — Telehealth: Payer: Self-pay | Admitting: Family Medicine

## 2024-04-04 ENCOUNTER — Other Ambulatory Visit: Payer: Self-pay

## 2024-04-04 NOTE — Telephone Encounter (Signed)
 Okay, she can discontinue it.

## 2024-04-04 NOTE — Telephone Encounter (Signed)
 Patient came in the office and stated that she is taking solifenacin  (VESICARE ) 5 MG tablet. Her ophthalmologist advised her not to take this medication due to the risk associated with glaucoma. Please advise.

## 2024-04-04 NOTE — Telephone Encounter (Signed)
 Routing to PCP for review.

## 2024-04-05 NOTE — Telephone Encounter (Signed)
 Patient has been informed.

## 2024-04-05 NOTE — Telephone Encounter (Signed)
 If the ophthalmologist feels Vesicare  will affect her eyes the other substitute is oxybutynin which will affect her eyes as well unfortunately.

## 2024-04-05 NOTE — Telephone Encounter (Signed)
 Patient is wanting to know which medication will be provided in place of the Vesicare 

## 2024-04-15 ENCOUNTER — Other Ambulatory Visit: Payer: Self-pay

## 2024-04-15 MED ORDER — MOUNJARO 15 MG/0.5ML ~~LOC~~ SOAJ
15.0000 mg | SUBCUTANEOUS | 1 refills | Status: AC
  Filled 2024-04-15: qty 2, 28d supply, fill #0
  Filled 2024-05-13: qty 2, 28d supply, fill #1
  Filled 2024-06-17: qty 2, 28d supply, fill #2
  Filled 2024-07-16: qty 2, 28d supply, fill #3

## 2024-04-19 ENCOUNTER — Other Ambulatory Visit: Payer: Self-pay

## 2024-04-22 ENCOUNTER — Other Ambulatory Visit: Payer: Self-pay

## 2024-04-23 ENCOUNTER — Other Ambulatory Visit: Payer: Self-pay

## 2024-04-30 ENCOUNTER — Other Ambulatory Visit: Payer: Self-pay

## 2024-05-07 ENCOUNTER — Other Ambulatory Visit: Payer: Self-pay | Admitting: Family Medicine

## 2024-05-07 ENCOUNTER — Other Ambulatory Visit: Payer: Self-pay

## 2024-05-07 DIAGNOSIS — M25512 Pain in left shoulder: Secondary | ICD-10-CM

## 2024-05-07 MED ORDER — TIZANIDINE HCL 4 MG PO TABS
4.0000 mg | ORAL_TABLET | Freq: Three times a day (TID) | ORAL | 0 refills | Status: DC | PRN
Start: 2024-05-07 — End: 2024-05-28
  Filled 2024-05-07: qty 60, 20d supply, fill #0

## 2024-05-08 ENCOUNTER — Other Ambulatory Visit: Payer: Self-pay

## 2024-05-09 ENCOUNTER — Other Ambulatory Visit: Payer: Self-pay

## 2024-05-13 ENCOUNTER — Other Ambulatory Visit: Payer: Self-pay

## 2024-05-14 ENCOUNTER — Other Ambulatory Visit: Payer: Self-pay

## 2024-05-22 ENCOUNTER — Other Ambulatory Visit: Payer: Self-pay

## 2024-05-22 MED ORDER — AZATHIOPRINE 50 MG PO TABS
50.0000 mg | ORAL_TABLET | Freq: Every day | ORAL | 2 refills | Status: AC
  Filled 2024-07-16: qty 90, 90d supply, fill #0

## 2024-05-22 MED ORDER — LINZESS 290 MCG PO CAPS: 290.0000 ug | ORAL_CAPSULE | Freq: Every morning | ORAL | 3 refills | Status: AC

## 2024-05-27 ENCOUNTER — Other Ambulatory Visit (HOSPITAL_COMMUNITY): Payer: Self-pay

## 2024-05-27 ENCOUNTER — Other Ambulatory Visit: Payer: Self-pay

## 2024-05-28 ENCOUNTER — Other Ambulatory Visit: Payer: Self-pay | Admitting: Family Medicine

## 2024-05-28 ENCOUNTER — Other Ambulatory Visit: Payer: Self-pay

## 2024-05-28 DIAGNOSIS — M25512 Pain in left shoulder: Secondary | ICD-10-CM

## 2024-05-28 MED ORDER — TIZANIDINE HCL 4 MG PO TABS
4.0000 mg | ORAL_TABLET | Freq: Three times a day (TID) | ORAL | 0 refills | Status: DC | PRN
  Filled 2024-05-28 – 2024-06-07 (×2): qty 60, 20d supply, fill #0

## 2024-05-29 ENCOUNTER — Other Ambulatory Visit: Payer: Self-pay

## 2024-06-05 ENCOUNTER — Other Ambulatory Visit: Payer: Self-pay

## 2024-06-06 ENCOUNTER — Other Ambulatory Visit: Payer: Self-pay

## 2024-06-07 ENCOUNTER — Other Ambulatory Visit: Payer: Self-pay | Admitting: Family Medicine

## 2024-06-07 ENCOUNTER — Other Ambulatory Visit: Payer: Self-pay

## 2024-06-07 ENCOUNTER — Encounter: Payer: Self-pay | Admitting: Surgery

## 2024-06-07 ENCOUNTER — Other Ambulatory Visit (HOSPITAL_COMMUNITY): Payer: Self-pay

## 2024-06-09 ENCOUNTER — Other Ambulatory Visit: Payer: Self-pay

## 2024-06-10 ENCOUNTER — Other Ambulatory Visit: Payer: Self-pay

## 2024-06-10 MED ORDER — ACCU-CHEK SOFTCLIX LANCETS MISC
6 refills | Status: AC
  Filled 2024-06-10: qty 100, 33d supply, fill #0
  Filled 2024-07-16: qty 100, 33d supply, fill #1

## 2024-06-10 MED ORDER — ACCU-CHEK GUIDE TEST VI STRP
ORAL_STRIP | 6 refills | Status: AC
  Filled 2024-06-10: qty 100, 33d supply, fill #0
  Filled 2024-07-16: qty 100, 33d supply, fill #1

## 2024-06-13 ENCOUNTER — Other Ambulatory Visit: Payer: Self-pay

## 2024-06-14 ENCOUNTER — Ambulatory Visit: Admitting: Podiatry

## 2024-06-14 DIAGNOSIS — M216X1 Other acquired deformities of right foot: Secondary | ICD-10-CM | POA: Diagnosis not present

## 2024-06-14 DIAGNOSIS — M216X2 Other acquired deformities of left foot: Secondary | ICD-10-CM | POA: Diagnosis not present

## 2024-06-14 DIAGNOSIS — L600 Ingrowing nail: Secondary | ICD-10-CM | POA: Diagnosis not present

## 2024-06-14 NOTE — Progress Notes (Unsigned)
 Subjective:  Patient ID: Julie Robertson, female    DOB: [birth year],  MRN: [id number]  Chief Complaint  Patient presents with   Nail Problem    48 y.o. female presents with the above complaint.  Patient presents for right hallux lateral border ingrown painful to touch is progressive and worse worse with ambulation and shoe pressure has not seen MRIs prior to seeing me denies any other acute complaints.  Pain scale is 7 out of 10 dull aching nature she would like to have it removed.  Pain scale 7 out of 10   Review of Systems: Negative except as noted in the HPI. Denies N/V/F/Ch.  Past Medical History:  Diagnosis Date   Anxiety    Asthma    Coronary artery disease    CVA (cerebral vascular accident) (HCC)    Diabetes mellitus without complication (HCC)    Diabetic neuropathy (HCC) 12/2013   vs carpal tunnell.  numbness tingling in right fingers. rx with Gabapentin .   Dyslipidemia 08/2009   Dyspnea    Gall stones 08/2009   GERD (gastroesophageal reflux disease)    H/O tubal ligation    Obesity    BMI 41, 250# 03/2015   Current Medications[1]  Tobacco Use History[2]  Allergies[3] Objective:  There were no vitals filed for this visit. There is no height or weight on file to calculate BMI. Constitutional Well developed. Well nourished.  Vascular Dorsalis pedis pulses palpable bilaterally. Posterior tibial pulses palpable bilaterally. Capillary refill normal to all digits.  No cyanosis or clubbing noted. Pedal hair growth normal.  Neurologic Normal speech. Oriented to person, place, and time. Epicritic sensation to light touch grossly present bilaterally.  Dermatologic Painful ingrowing nail at lateral nail borders of the hallux nail right. No other open wounds. No skin lesions.  Orthopedic: Normal joint ROM without pain or crepitus bilaterally. No visible deformities. No bony tenderness.   Radiographs: None Assessment:   1. Ingrown toenail of right  foot   2. Other acquired deformities of right foot   3. Other acquired deformities of left foot    Plan:  Patient was evaluated and treated and all questions answered.  Ingrown Nail, right -Patient elects to proceed with minor surgery to remove ingrown toenail removal today. Consent reviewed and signed by patient. -Ingrown nail excised. See procedure note. -Educated on post-procedure care including soaking. Written instructions provided and reviewed. -Patient to follow up in 2 weeks for nail check.   Pes planovalgus/foot deformity -I explained to patient the etiology of pes planovalgus and relationship with heel pain/arch pain and various treatment options were discussed.  Given patient foot structure in the setting of heel pain/arch pain I believe patient will benefit from custom-made orthotics to help control the hindfoot motion support the arch of the foot and take the stress away from arches.  Patient agrees with the plan like to proceed with orthotics -Patient was casted for orthotics   Procedure: Excision of Ingrown Toenail Location: Right 1st toe lateral nail borders. Anesthesia: Lidocaine  1% plain; 1.5 mL and Marcaine  0.5% plain; 1.5 mL, digital block. Skin Prep: Betadine. Dressing: Silvadene; telfa; dry, sterile, compression dressing. Technique: Following skin prep, the toe was exsanguinated and a tourniquet was secured at the base of the toe. The affected nail border was freed, split with a nail splitter, and excised. Chemical matrixectomy was then performed with phenol and irrigated out with alcohol . The tourniquet was then removed and sterile dressing applied. Disposition: Patient tolerated procedure well. Patient to return  in 2 weeks for follow-up.   No follow-ups on file.    [1]  Current Outpatient Medications:    Accu-Chek Softclix Lancets lancets, Use to check blood sugar 3 times daily., Disp: 100 each, Rfl: 6   aprepitant  (EMEND ) 40 MG capsule, Take 1 capsule at 5am  on the morning of surgery., Disp: 1 capsule, Rfl: 0   aspirin  81 MG chewable tablet, Chew 1 tablet (81 mg total) by mouth daily., Disp: 90 tablet, Rfl: 1   atorvastatin  (LIPITOR) 40 MG tablet, Take 1 tablet (40 mg total) by mouth at bedtime., Disp: 90 tablet, Rfl: 3   azaTHIOprine  (IMURAN ) 50 MG tablet, Take 1 tablet (50 mg total) by mouth daily., Disp: 90 tablet, Rfl: 2   blood glucose meter kit and supplies KIT, Use up to four times daily as directed., Disp: 1 each, Rfl: 11   Blood Glucose Monitoring Suppl (ACCU-CHEK GUIDE) w/Device KIT, Use to check blood sugar 3 times daily., Disp: 1 kit, Rfl: 0   Continuous Glucose Sensor (FREESTYLE LIBRE 3 SENSOR) MISC, USE AS DIRECTED, Disp: 3 each, Rfl: 11   empagliflozin  (JARDIANCE ) 25 MG TABS tablet, Take 1 tablet (25 mg total) by mouth daily before breakfast., Disp: 90 tablet, Rfl: 1   fenofibrate  (TRICOR ) 145 MG tablet, Take 1 tablet (145 mg total) by mouth daily., Disp: 30 tablet, Rfl: 6   gabapentin  (NEURONTIN ) 300 MG capsule, Take 1 capsule (300 mg total) by mouth 3 (three) times daily., Disp: 270 capsule, Rfl: 2   glipiZIDE  (GLUCOTROL ) 10 MG tablet, Take 1 tablet (10 mg total) by mouth 2 (two) times daily., Disp: 180 tablet, Rfl: 1   glucose blood (ACCU-CHEK GUIDE TEST) test strip, Use to check blood sugar 3 times daily., Disp: 100 each, Rfl: 6   Insulin  Glargine (BASAGLAR  KWIKPEN) 100 UNIT/ML, Inject 25 Units into the skin at bedtime., Disp: 60 mL, Rfl: 5   insulin  lispro (HUMALOG  KWIKPEN) 100 UNIT/ML KwikPen, Inject according to sliding scale MDD 30 units Subcutaneous three times daily OK to sub Novolog  30 days, Disp: 9 mL, Rfl: 5   latanoprost  (XALATAN ) 0.005 % ophthalmic solution, Place 1 drop into both eyes every evening., Disp: 5 mL, Rfl: 11   levofloxacin  (LEVAQUIN ) 500 MG tablet, Take one tablet by mouth the night before surgery between 9pm and 11pm, Disp: 1 tablet, Rfl: 0   linaclotide  (LINZESS ) 290 MCG CAPS capsule, Take 1 capsule (290 mcg  total) by mouth daily before breakfast., Disp: 90 capsule, Rfl: 1   linaclotide  (LINZESS ) 290 MCG CAPS capsule, Take 1 capsule (290 mcg total) by mouth in the morning., Disp: 90 capsule, Rfl: 3   loratadine  (CLARITIN ) 10 MG tablet, Take 1 tablet (10 mg total) by mouth daily as needed for allergies., Disp: , Rfl:    metFORMIN  (GLUCOPHAGE ) 500 MG tablet, Take 2 tablets (1,000 mg total) by mouth 2 (two) times daily with a meal., Disp: 360 tablet, Rfl: 1   metoCLOPramide  (REGLAN ) 10 MG tablet, Take 1 tablet at 5am the morning of surgery and then every 6 to 8 hours as needed for nausea., Disp: 30 tablet, Rfl: 3   metoprolol  succinate (TOPROL  XL) 25 MG 24 hr tablet, Take 1 tablet (25 mg total) by mouth daily., Disp: 90 tablet, Rfl: 1   Na Sulfate-K Sulfate-Mg Sulfate concentrate (SUPREP) 17.5-3.13-1.6 GM/177ML SOLN, Take 177 mLs by mouth 2 (two) times., Disp: 354 mL, Rfl: 0   ondansetron  (ZOFRAN -ODT) 8 MG disintegrating tablet, Take one tablet (8 mg dose) by mouth  every 8 (eight) hours as needed for Nausea for up to 7 days., Disp: 30 tablet, Rfl: 1   pantoprazole  (PROTONIX ) 40 MG tablet, Take 1 tablet (40 mg total) by mouth daily before breakfast. (Stop taking omeprazole )., Disp: 90 tablet, Rfl: 1   solifenacin  (VESICARE ) 5 MG tablet, Take 1 tablet (5 mg total) by mouth daily., Disp: 90 tablet, Rfl: 1   tirzepatide  (MOUNJARO ) 10 MG/0.5ML Pen, Inject 10 mg into the skin once a week., Disp: 2 mL, Rfl: 5   tirzepatide  (MOUNJARO ) 12.5 MG/0.5ML Pen, Inject 12.5 mg into the skin once a week. For 4 weeks then increase to 15mg , Disp: 2 mL, Rfl: 0   tirzepatide  (MOUNJARO ) 15 MG/0.5ML Pen, Inject 15 mg into the skin once a week., Disp: 6 mL, Rfl: 1   tirzepatide  (MOUNJARO ) 15 MG/0.5ML Pen, Inject 15 mg into the skin once a week., Disp: 6 mL, Rfl: 1   tiZANidine  (ZANAFLEX ) 4 MG tablet, Take 1 tablet (4 mg total) by mouth every 8 (eight) hours as needed for muscle spasms., Disp: 60 tablet, Rfl: 0   traMADol  (ULTRAM ) 50  MG tablet, Take 1 tablet (50 mg total) by mouth every 6 (six) hours as needed., Disp: 10 tablet, Rfl: 0 [2]  Social History Tobacco Use  Smoking Status Former   Current packs/day: 1.50   Average packs/day: 1.5 packs/day for 4.0 years (6.0 ttl pk-yrs)   Types: Cigarettes   Passive exposure: Never  Smokeless Tobacco Never  [3] No Known Allergies

## 2024-06-17 ENCOUNTER — Other Ambulatory Visit: Payer: Self-pay | Admitting: Family Medicine

## 2024-06-17 ENCOUNTER — Other Ambulatory Visit: Payer: Self-pay

## 2024-06-17 DIAGNOSIS — E1169 Type 2 diabetes mellitus with other specified complication: Secondary | ICD-10-CM

## 2024-06-17 MED ORDER — FENOFIBRATE 145 MG PO TABS
145.0000 mg | ORAL_TABLET | Freq: Every day | ORAL | 6 refills | Status: AC
  Filled 2024-06-17 – 2024-07-24 (×3): qty 30, 30d supply, fill #0

## 2024-06-24 ENCOUNTER — Other Ambulatory Visit: Payer: Self-pay

## 2024-06-24 MED ORDER — NYSTATIN 100000 UNIT/GM EX POWD
CUTANEOUS | 3 refills | Status: AC
  Filled 2024-06-24: qty 15, 8d supply, fill #0
  Filled 2024-07-16: qty 15, 8d supply, fill #1
  Filled 2024-08-08: qty 15, 8d supply, fill #2

## 2024-06-24 MED ORDER — MOUNJARO 15 MG/0.5ML ~~LOC~~ SOAJ
15.0000 mg | SUBCUTANEOUS | 0 refills | Status: AC
  Filled 2024-08-09: qty 2, 28d supply, fill #0

## 2024-07-01 ENCOUNTER — Other Ambulatory Visit: Payer: Self-pay

## 2024-07-02 ENCOUNTER — Other Ambulatory Visit: Payer: Self-pay

## 2024-07-08 ENCOUNTER — Other Ambulatory Visit: Payer: Self-pay

## 2024-07-10 ENCOUNTER — Ambulatory Visit: Admitting: Podiatry

## 2024-07-10 DIAGNOSIS — L600 Ingrowing nail: Secondary | ICD-10-CM

## 2024-07-10 NOTE — Progress Notes (Signed)
 Subjective: Julie Robertson is a 49 y.o.  female returns to office today for follow up evaluation after having right Hallux Lateral border nail avulsion performed. Patient has been soaking using epsom salt and applying topical antibiotic covered with bandaid daily. Patient denies fevers, chills, nausea, vomiting. Denies any calf pain, chest pain, SOB.   Objective:  Vitals: Reviewed  General: Well developed, nourished, in no acute distress, alert and oriented x3   Dermatology: Skin is warm, dry and supple bilateral. Lateral hallux nail border appears to be clean, dry, with mild granular tissue and surrounding scab. There is no surrounding erythema, edema, drainage/purulence. The remaining nails appear unremarkable at this time. There are no other lesions or other signs of infection present.  Neurovascular status: Intact. No lower extremity swelling; No pain with calf compression bilateral.  Musculoskeletal: Decreased tenderness to palpation of the Lateral hallux nail fold(s). Muscular strength within normal limits bilateral.   Assesement and Plan: S/p partial nail avulsion, doing well.   -Continue soaking in epsom salts twice a day followed by antibiotic ointment and a band-aid as needed. Can leave uncovered at night. .  -If the area does not heal properply, call the office for follow-up appointment, or sooner if any problems arise.  -Monitor for any signs/symptoms of infection. Call the office immediately if any occur or go directly to the emergency room. Call with any questions/concerns.  Rael Tilly, DPM

## 2024-07-16 ENCOUNTER — Other Ambulatory Visit: Payer: Self-pay

## 2024-07-18 ENCOUNTER — Ambulatory Visit: Payer: Medicaid Other | Admitting: Dermatology

## 2024-07-22 ENCOUNTER — Telehealth: Payer: Self-pay

## 2024-07-22 NOTE — Telephone Encounter (Signed)
 Orthotics are in [place] office. Spoke to [place] and scheduled her to see Dr. Tobie on 07/24/2024 to [facility].

## 2024-07-24 ENCOUNTER — Other Ambulatory Visit: Payer: Self-pay | Admitting: Family Medicine

## 2024-07-24 ENCOUNTER — Telehealth: Payer: Self-pay

## 2024-07-24 ENCOUNTER — Ambulatory Visit: Admitting: Podiatry

## 2024-07-24 ENCOUNTER — Other Ambulatory Visit: Payer: Self-pay

## 2024-07-24 ENCOUNTER — Other Ambulatory Visit (HOSPITAL_COMMUNITY): Payer: Self-pay

## 2024-07-24 DIAGNOSIS — N3281 Overactive bladder: Secondary | ICD-10-CM

## 2024-07-24 DIAGNOSIS — L97421 Non-pressure chronic ulcer of left heel and midfoot limited to breakdown of skin: Secondary | ICD-10-CM

## 2024-07-24 MED ORDER — SOLIFENACIN SUCCINATE 5 MG PO TABS
5.0000 mg | ORAL_TABLET | Freq: Every day | ORAL | 0 refills | Status: AC
  Filled 2024-07-24: qty 90, 90d supply, fill #0

## 2024-07-24 NOTE — Telephone Encounter (Signed)
 Patient is requesting refills on solifenacin  (VESICARE ) 5 MG tablet

## 2024-07-24 NOTE — Progress Notes (Signed)
 "  Subjective:  Patient ID: Julie Robertson, female    DOB: [birth year],  MRN: [id number]  Chief Complaint  Patient presents with   Wound Check    L heel wound, minimal redness and swelling with some drainage. heel was hit with door.  Pt did not bring tennis shoes because of sore on heel. Diabetic A1c 6.8 5 months ago. 81 mg Asprin    49 y.o. female presents with the above complaint.  Patient presents with complaint left posterior heel superficial ulceration likely due to shoe rubbing.  She states it just came out of nowhere she hit the door as well.  She wanted to discuss treatment options for this there is some drainage.  Nothing purulent.  Mostly yellowish.  Pain scale as 5 out of 10 dull aching nature.  She is a diabetic with last A1c of 6.8   Review of Systems: Negative except as noted in the HPI. Denies N/V/F/Ch.  Past Medical History:  Diagnosis Date   Anxiety    Asthma    Coronary artery disease    CVA (cerebral vascular accident) (HCC)    Diabetes mellitus without complication (HCC)    Diabetic neuropathy (HCC) 12/2013   vs carpal tunnell.  numbness tingling in right fingers. rx with Gabapentin .   Dyslipidemia 08/2009   Dyspnea    Gall stones 08/2009   GERD (gastroesophageal reflux disease)    H/O tubal ligation    Obesity    BMI 41, 250# 03/2015   Current Medications[1]  Tobacco Use History[2]  Allergies[3] Objective:  There were no vitals filed for this visit. There is no height or weight on file to calculate BMI. Constitutional Well developed. Well nourished.  Vascular Dorsalis pedis pulses palpable bilaterally. Posterior tibial pulses palpable bilaterally. Capillary refill normal to all digits.  No cyanosis or clubbing noted. Pedal hair growth normal.  Neurologic Normal speech. Oriented to person, place, and time. Epicritic sensation to light touch grossly present bilaterally.  Dermatologic Left heel posterior superficial ulceration noted.   Does not probe down to deep tissue.  No exposure of tendon noted.  This is limited to the breakdown of the skin no signs of infection noted no redness purulent drainage malodor present  Orthopedic: Normal joint ROM without pain or crepitus bilaterally. No visible deformities. No bony tenderness.   Radiographs: None Assessment:   1. Heel ulcer, left, limited to breakdown of skin [facility])    Plan:  Patient was evaluated and treated and all questions answered.  Left heel superficial ulcer limited to the breakdown of the skin - All questions and concerns were discussed with the patient in extensive detail encouraged her to do Betadine wet-to-dry dressing changes daily until closure.  She states understanding. - Discussed shoe gear modification as well \ .kppes -Pes planovalgus/foot deformity -I explained to patient the etiology of pes planovalgus and relationship with heel pain/arch pain and various treatment options were discussed.  Given patient foot structure in the setting of heel pain/arch pain I believe patient will benefit from custom-made orthotics to help control the hindfoot motion support the arch of the foot and take the stress away from arches.  Patient agrees with the plan like to proceed with orthotics - Orthotics were dispensed and functioning well.  I discussed break-in period.    No follow-ups on file.    [1]  Current Outpatient Medications:    Accu-Chek Softclix Lancets lancets, Use to check blood sugar 3 times daily., Disp: 100 each, Rfl: 6  aprepitant  (EMEND ) 40 MG capsule, Take 1 capsule at 5am on the morning of surgery., Disp: 1 capsule, Rfl: 0   aspirin  81 MG chewable tablet, Chew 1 tablet (81 mg total) by mouth daily., Disp: 90 tablet, Rfl: 1   atorvastatin  (LIPITOR) 40 MG tablet, Take 1 tablet (40 mg total) by mouth at bedtime., Disp: 90 tablet, Rfl: 3   azaTHIOprine  (IMURAN ) 50 MG tablet, Take 1 tablet (50 mg total) by mouth daily., Disp: 90 tablet, Rfl: 2    blood glucose meter kit and supplies KIT, Use up to four times daily as directed., Disp: 1 each, Rfl: 11   Blood Glucose Monitoring Suppl (ACCU-CHEK GUIDE) w/Device KIT, Use to check blood sugar 3 times daily., Disp: 1 kit, Rfl: 0   Continuous Glucose Sensor (FREESTYLE LIBRE 3 SENSOR) MISC, USE AS DIRECTED, Disp: 3 each, Rfl: 11   empagliflozin  (JARDIANCE ) 25 MG TABS tablet, Take 1 tablet (25 mg total) by mouth daily before breakfast., Disp: 90 tablet, Rfl: 1   fenofibrate  (TRICOR ) 145 MG tablet, Take 1 tablet (145 mg total) by mouth daily., Disp: 30 tablet, Rfl: 6   gabapentin  (NEURONTIN ) 300 MG capsule, Take 1 capsule (300 mg total) by mouth 3 (three) times daily., Disp: 270 capsule, Rfl: 2   glipiZIDE  (GLUCOTROL ) 10 MG tablet, Take 1 tablet (10 mg total) by mouth 2 (two) times daily., Disp: 180 tablet, Rfl: 1   glucose blood (ACCU-CHEK GUIDE TEST) test strip, Use to check blood sugar 3 times daily., Disp: 100 each, Rfl: 6   insulin  aspart (NOVOLOG ) 100 UNIT/ML FlexPen, Inject according to sliding scale MDD 30 units Subcutaneous three times daily OK to sub Novolog  30 days, Disp: 9 mL, Rfl: 5   Insulin  Glargine (BASAGLAR  KWIKPEN) 100 UNIT/ML, Inject 25 Units into the skin at bedtime., Disp: 60 mL, Rfl: 5   latanoprost  (XALATAN ) 0.005 % ophthalmic solution, Place 1 drop into both eyes every evening., Disp: 5 mL, Rfl: 11   levofloxacin  (LEVAQUIN ) 500 MG tablet, Take one tablet by mouth the night before surgery between 9pm and 11pm, Disp: 1 tablet, Rfl: 0   linaclotide  (LINZESS ) 290 MCG CAPS capsule, Take 1 capsule (290 mcg total) by mouth daily before breakfast., Disp: 90 capsule, Rfl: 1   linaclotide  (LINZESS ) 290 MCG CAPS capsule, Take 1 capsule (290 mcg total) by mouth in the morning., Disp: 90 capsule, Rfl: 3   loratadine  (CLARITIN ) 10 MG tablet, Take 1 tablet (10 mg total) by mouth daily as needed for allergies., Disp: , Rfl:    metFORMIN  (GLUCOPHAGE ) 500 MG tablet, Take 2 tablets (1,000 mg total)  by mouth 2 (two) times daily with a meal., Disp: 360 tablet, Rfl: 1   metoprolol  succinate (TOPROL  XL) 25 MG 24 hr tablet, Take 1 tablet (25 mg total) by mouth daily., Disp: 90 tablet, Rfl: 1   Na Sulfate-K Sulfate-Mg Sulfate concentrate (SUPREP) 17.5-3.13-1.6 GM/177ML SOLN, Take 177 mLs by mouth 2 (two) times., Disp: 354 mL, Rfl: 0   nystatin  (MYCOSTATIN /NYSTOP ) powder, Apply topically 4 (four) times daily., Disp: 15 g, Rfl: 3   ondansetron  (ZOFRAN -ODT) 8 MG disintegrating tablet, Take one tablet (8 mg dose) by mouth every 8 (eight) hours as needed for Nausea for up to 7 days., Disp: 30 tablet, Rfl: 1   pantoprazole  (PROTONIX ) 40 MG tablet, Take 1 tablet (40 mg total) by mouth daily before breakfast. (Stop taking omeprazole )., Disp: 90 tablet, Rfl: 1   tirzepatide  (MOUNJARO ) 10 MG/0.5ML Pen, Inject 10 mg into the skin once  a week., Disp: 2 mL, Rfl: 5   tirzepatide  (MOUNJARO ) 12.5 MG/0.5ML Pen, Inject 12.5 mg into the skin once a week. For 4 weeks then increase to 15mg , Disp: 2 mL, Rfl: 0   tirzepatide  (MOUNJARO ) 15 MG/0.5ML Pen, Inject 15 mg into the skin once a week., Disp: 6 mL, Rfl: 1   tirzepatide  (MOUNJARO ) 15 MG/0.5ML Pen, Inject 15 mg into the skin once a week., Disp: 6 mL, Rfl: 1   tirzepatide  (MOUNJARO ) 15 MG/0.5ML Pen, Inject 15 mg into the skin once a week., Disp: 6 mL, Rfl: 0   tiZANidine  (ZANAFLEX ) 4 MG tablet, Take 1 tablet (4 mg total) by mouth every 8 (eight) hours as needed for muscle spasms., Disp: 60 tablet, Rfl: 0   traMADol  (ULTRAM ) 50 MG tablet, Take 1 tablet (50 mg total) by mouth every 6 (six) hours as needed., Disp: 10 tablet, Rfl: 0   solifenacin  (VESICARE ) 5 MG tablet, Take 1 tablet (5 mg total) by mouth daily., Disp: 90 tablet, Rfl: 0 [2]  Social History Tobacco Use  Smoking Status Former   Current packs/day: 1.50   Average packs/day: 1.5 packs/day for 4.0 years (6.0 ttl pk-yrs)   Types: Cigarettes   Passive exposure: Never  Smokeless Tobacco Never  [3] No Known  Allergies  "

## 2024-07-25 ENCOUNTER — Other Ambulatory Visit: Payer: Self-pay

## 2024-07-25 NOTE — Telephone Encounter (Signed)
Patient was called and informed of medication being sent to pharmacy. 

## 2024-07-26 ENCOUNTER — Other Ambulatory Visit: Payer: Self-pay

## 2024-07-29 ENCOUNTER — Other Ambulatory Visit: Payer: Self-pay

## 2024-07-31 ENCOUNTER — Other Ambulatory Visit: Payer: Self-pay | Admitting: Family Medicine

## 2024-07-31 ENCOUNTER — Other Ambulatory Visit: Payer: Self-pay

## 2024-07-31 DIAGNOSIS — E1169 Type 2 diabetes mellitus with other specified complication: Secondary | ICD-10-CM

## 2024-07-31 MED ORDER — GABAPENTIN 300 MG PO CAPS
300.0000 mg | ORAL_CAPSULE | Freq: Three times a day (TID) | ORAL | 2 refills | Status: AC
  Filled 2024-08-08: qty 270, 90d supply, fill #0

## 2024-08-02 ENCOUNTER — Other Ambulatory Visit: Payer: Self-pay

## 2024-08-05 ENCOUNTER — Other Ambulatory Visit: Payer: Self-pay

## 2024-08-06 ENCOUNTER — Other Ambulatory Visit: Payer: Self-pay

## 2024-08-06 ENCOUNTER — Telehealth: Payer: Self-pay

## 2024-08-06 NOTE — Telephone Encounter (Signed)
 Copied from CRM #[id number]. Topic: Clinical - Prescription Issue >> Aug 02, 2024  3:09 PM Nathanel BROCKS wrote: Reason for CRM: insulin  aspart (NOVOLOG ) 100 UNIT/ML FlexPen   Pharmacy can not get this in generic any more. Medicaid doesn't cover name brand unless the dr gets a prior auth. I called the pharmacy and they stated that they sent paperwork over advising. Could you please follow up on this, pt stated that she's been out 2 weeks.

## 2024-08-07 ENCOUNTER — Other Ambulatory Visit: Payer: Self-pay

## 2024-08-08 ENCOUNTER — Other Ambulatory Visit: Payer: Self-pay | Admitting: Family Medicine

## 2024-08-08 ENCOUNTER — Other Ambulatory Visit: Payer: Self-pay

## 2024-08-08 DIAGNOSIS — M25512 Pain in left shoulder: Secondary | ICD-10-CM

## 2024-08-08 MED ORDER — TIZANIDINE HCL 4 MG PO TABS
4.0000 mg | ORAL_TABLET | Freq: Three times a day (TID) | ORAL | 0 refills | Status: AC | PRN
  Filled 2024-08-08: qty 60, 20d supply, fill #0

## 2024-08-09 ENCOUNTER — Other Ambulatory Visit: Payer: Self-pay | Admitting: Pharmacist

## 2024-08-09 ENCOUNTER — Other Ambulatory Visit: Payer: Self-pay

## 2024-08-09 MED ORDER — INSULIN LISPRO (1 UNIT DIAL) 100 UNIT/ML (KWIKPEN)
PEN_INJECTOR | SUBCUTANEOUS | 2 refills | Status: AC
  Filled 2024-08-09: qty 9, 30d supply, fill #0

## 2024-08-14 ENCOUNTER — Ambulatory Visit: Admitting: Podiatry

## 2024-08-26 ENCOUNTER — Other Ambulatory Visit

## 2024-08-26 ENCOUNTER — Encounter

## 2024-08-29 ENCOUNTER — Ambulatory Visit: Admitting: Dermatology

## 2024-10-02 ENCOUNTER — Ambulatory Visit: Admitting: Family Medicine
